# Patient Record
Sex: Male | Born: 1945 | Race: White | Hispanic: No | Marital: Married | Smoking: Former smoker
Health system: Southern US, Community
[De-identification: ages and names within clinical notes are randomized; demographics above are authoritative.]

## PROBLEM LIST (undated history)

## (undated) DIAGNOSIS — I2510 Atherosclerotic heart disease of native coronary artery without angina pectoris: Secondary | ICD-10-CM

## (undated) DIAGNOSIS — J449 Chronic obstructive pulmonary disease, unspecified: Secondary | ICD-10-CM

## (undated) DIAGNOSIS — I499 Cardiac arrhythmia, unspecified: Secondary | ICD-10-CM

## (undated) DIAGNOSIS — E119 Type 2 diabetes mellitus without complications: Secondary | ICD-10-CM

## (undated) DIAGNOSIS — C61 Malignant neoplasm of prostate: Secondary | ICD-10-CM

## (undated) DIAGNOSIS — I639 Cerebral infarction, unspecified: Secondary | ICD-10-CM

## (undated) DIAGNOSIS — J189 Pneumonia, unspecified organism: Secondary | ICD-10-CM

## (undated) DIAGNOSIS — K219 Gastro-esophageal reflux disease without esophagitis: Secondary | ICD-10-CM

## (undated) DIAGNOSIS — E785 Hyperlipidemia, unspecified: Secondary | ICD-10-CM

## (undated) DIAGNOSIS — I4891 Unspecified atrial fibrillation: Secondary | ICD-10-CM

## (undated) DIAGNOSIS — I10 Essential (primary) hypertension: Secondary | ICD-10-CM

## (undated) DIAGNOSIS — I739 Peripheral vascular disease, unspecified: Secondary | ICD-10-CM

## (undated) DIAGNOSIS — I509 Heart failure, unspecified: Secondary | ICD-10-CM

## (undated) HISTORY — DX: Cardiac arrhythmia, unspecified: I49.9

## (undated) HISTORY — PX: COLONOSCOPY: SHX174

## (undated) HISTORY — PX: PROSTATE BIOPSY: SHX241

## (undated) HISTORY — DX: Heart failure, unspecified: I50.9

## (undated) HISTORY — DX: Pneumonia, unspecified organism: J18.9

## (undated) HISTORY — PX: TONSILLECTOMY: SUR1361

## (undated) HISTORY — DX: Cerebral infarction, unspecified: I63.9

## (undated) HISTORY — DX: Gastro-esophageal reflux disease without esophagitis: K21.9

## (undated) HISTORY — PX: EYE SURGERY: SHX253

---

## 2004-11-03 HISTORY — PX: CORONARY ARTERY BYPASS GRAFT: SHX141

## 2008-05-12 ENCOUNTER — Ambulatory Visit: Payer: Self-pay | Admitting: Cardiology

## 2008-09-14 ENCOUNTER — Ambulatory Visit: Payer: Self-pay | Admitting: Vascular Surgery

## 2010-06-15 ENCOUNTER — Inpatient Hospital Stay: Payer: Self-pay | Admitting: Internal Medicine

## 2010-07-21 ENCOUNTER — Inpatient Hospital Stay: Payer: Self-pay | Admitting: *Deleted

## 2010-08-14 ENCOUNTER — Ambulatory Visit: Payer: Self-pay | Admitting: Family

## 2010-08-23 ENCOUNTER — Ambulatory Visit: Payer: Self-pay | Admitting: Family

## 2010-09-20 ENCOUNTER — Ambulatory Visit: Payer: Self-pay | Admitting: Family

## 2011-02-07 ENCOUNTER — Ambulatory Visit: Payer: Self-pay | Admitting: Family

## 2013-12-21 ENCOUNTER — Ambulatory Visit: Payer: Self-pay | Admitting: Neurology

## 2014-01-09 ENCOUNTER — Emergency Department: Payer: Self-pay | Admitting: Emergency Medicine

## 2014-01-09 ENCOUNTER — Encounter: Payer: Self-pay | Admitting: Neurology

## 2014-02-01 ENCOUNTER — Encounter: Payer: Self-pay | Admitting: Neurology

## 2014-04-29 ENCOUNTER — Inpatient Hospital Stay: Payer: Self-pay | Admitting: Internal Medicine

## 2014-04-29 LAB — URINALYSIS, COMPLETE
BILIRUBIN, UR: NEGATIVE
Bacteria: NONE SEEN
Glucose,UR: NEGATIVE mg/dL (ref 0–75)
KETONE: NEGATIVE
Nitrite: NEGATIVE
Ph: 5 (ref 4.5–8.0)
Protein: 30
RBC,UR: 4 /HPF (ref 0–5)
SPECIFIC GRAVITY: 1.009 (ref 1.003–1.030)
Squamous Epithelial: <1

## 2014-04-29 LAB — CBC
HCT: 43.7 % (ref 40.0–52.0)
HGB: 14.1 g/dL (ref 13.0–18.0)
MCH: 28.8 pg (ref 26.0–34.0)
MCHC: 32.2 g/dL (ref 32.0–36.0)
MCV: 89 fL (ref 80–100)
PLATELETS: 254 10*3/uL (ref 150–440)
RBC: 4.9 10*6/uL (ref 4.40–5.90)
RDW: 14.7 % — AB (ref 11.5–14.5)
WBC: 21.5 10*3/uL — ABNORMAL HIGH (ref 3.8–10.6)

## 2014-04-29 LAB — TROPONIN I
TROPONIN-I: 0.03 ng/mL
TROPONIN-I: 0.03 ng/mL
TROPONIN-I: 0.04 ng/mL

## 2014-04-29 LAB — COMPREHENSIVE METABOLIC PANEL
ALBUMIN: 4 g/dL (ref 3.4–5.0)
ANION GAP: 9 (ref 7–16)
Alkaline Phosphatase: 92 U/L
BUN: 16 mg/dL (ref 7–18)
Bilirubin,Total: 1 mg/dL (ref 0.2–1.0)
CHLORIDE: 98 mmol/L (ref 98–107)
Calcium, Total: 9 mg/dL (ref 8.5–10.1)
Co2: 27 mmol/L (ref 21–32)
Creatinine: 1.68 mg/dL — ABNORMAL HIGH (ref 0.60–1.30)
GFR CALC AF AMER: 48 — AB
GFR CALC NON AF AMER: 41 — AB
Glucose: 279 mg/dL — ABNORMAL HIGH (ref 65–99)
Osmolality: 279 (ref 275–301)
Potassium: 3.2 mmol/L — ABNORMAL LOW (ref 3.5–5.1)
SGOT(AST): 10 U/L — ABNORMAL LOW (ref 15–37)
SGPT (ALT): 17 U/L (ref 12–78)
SODIUM: 134 mmol/L — AB (ref 136–145)
Total Protein: 7.7 g/dL (ref 6.4–8.2)

## 2014-04-29 LAB — PROTIME-INR
INR: 0.9
Prothrombin Time: 12.5 secs (ref 11.5–14.7)

## 2014-04-30 LAB — BASIC METABOLIC PANEL
Anion Gap: 6 — ABNORMAL LOW (ref 7–16)
BUN: 14 mg/dL (ref 7–18)
CHLORIDE: 105 mmol/L (ref 98–107)
CREATININE: 1.36 mg/dL — AB (ref 0.60–1.30)
Calcium, Total: 8.7 mg/dL (ref 8.5–10.1)
Co2: 25 mmol/L (ref 21–32)
EGFR (African American): >60
GFR CALC NON AF AMER: 53 — AB
Glucose: 84 mg/dL (ref 65–99)
Osmolality: 272 (ref 275–301)
Potassium: 3.8 mmol/L (ref 3.5–5.1)
Sodium: 136 mmol/L (ref 136–145)

## 2014-04-30 LAB — CBC WITH DIFFERENTIAL/PLATELET
BASOS ABS: 0.1 10*3/uL (ref 0.0–0.1)
BASOS PCT: 0.5 %
EOS ABS: 0 10*3/uL (ref 0.0–0.7)
Eosinophil %: 0.2 %
HCT: 37.7 % — AB (ref 40.0–52.0)
HGB: 12.7 g/dL — ABNORMAL LOW (ref 13.0–18.0)
LYMPHS PCT: 7.1 %
Lymphocyte #: 1.6 10*3/uL (ref 1.0–3.6)
MCH: 30.2 pg (ref 26.0–34.0)
MCHC: 33.6 g/dL (ref 32.0–36.0)
MCV: 90 fL (ref 80–100)
MONOS PCT: 5.8 %
Monocyte #: 1.3 x10 3/mm — ABNORMAL HIGH (ref 0.2–1.0)
NEUTROS ABS: 19 10*3/uL — AB (ref 1.4–6.5)
Neutrophil %: 86.4 %
Platelet: 210 10*3/uL (ref 150–440)
RBC: 4.19 10*6/uL — ABNORMAL LOW (ref 4.40–5.90)
RDW: 14.8 % — ABNORMAL HIGH (ref 11.5–14.5)
WBC: 22 10*3/uL — ABNORMAL HIGH (ref 3.8–10.6)

## 2014-04-30 LAB — MAGNESIUM: Magnesium: 2 mg/dL

## 2014-05-01 LAB — URINE CULTURE

## 2014-05-03 LAB — CULTURE, BLOOD (SINGLE)

## 2014-05-11 DIAGNOSIS — R262 Difficulty in walking, not elsewhere classified: Secondary | ICD-10-CM | POA: Insufficient documentation

## 2014-10-17 ENCOUNTER — Ambulatory Visit: Payer: Self-pay | Admitting: Gastroenterology

## 2015-02-24 NOTE — Discharge Summary (Signed)
PATIENT NAME:  [name], [name] MR#:  [id number] DATE OF BIRTH:  [birth year]  DATE OF ADMISSION:  04/29/2014 DATE OF DISCHARGE:  05/01/2014  PRIMARY CARE PHYSICIAN: Dr. Baldemar Lenis  DISCHARGE DIAGNOSES: 1.  Pseudomonas urinary tract infection. 2.  Acute renal failure.  3.  Shortness of breath with pulmonary edema. 4.  Coronary artery disease.  5.  Chronic obstructive pulmonary disease.  6.  Diabetes mellitus type 2.   DISCHARGE MEDICATIONS: 1.  ProAir 2 puffs as needed for wheezing.  2.  Lisinopril 5 mg p.o. daily.  3.  Metformin 1 gram p.o. b.i.d.  4.  Omeprazole 20 mg p.o. daily.  5.  Advair Diskus 250/50 one puff b.i.d.  6.  Spiriva 18 mcg inhalation daily.  7.  Imdur 30 mg p.o. daily.  8.  KCl 10 mEq p.o. daily with Lasix.  9.  Lopressor 50 mg p.o. b.i.d.  10.  Lantus 50 units at bedtime.  11.  Aspirin 81 mg daily. 12.  Furosemide 40 mg p.o. b.i.d.  13.  Ocuvite multivitamins 1 tablet daily.  14.  Plavix 75 mg p.o. daily.  15.  Simvastatin 20 mg p.o. daily.  16.  Levaquin 750 mg every other day for 5 tablets.   HOME HEALTH: None:   DISCHARGE DIET: Low-sodium, ADA diet.  CONSULTATIONS: None.   HOSPITAL COURSE:  1.  Urinary tract infection. A 69 year old male patient with multiple medical problems brought in because of trouble breathing. The patient had a temperature of 101 at home. He had 1 or 2 in the Emergency Room and the patient admitted to the hospitalist service for sepsis with leukocytosis, possible source is UTI. He received Levaquin and he was admitted to the hospitalist service. Blood cultures were negative. Urine culture showed Pseudomonas in the urine. The patient's urine culture showed more than 100,000 colonies of Pseudomonas sensitive to Cipro, and Levaquin. The patient's WBC was 21.1 on admission. The patient has been afebrile for 2 days and then he was discharged home. Discharged home with Levaquin to continue.  2.  Shortness of breath. The patient did not have any  troponin elevation and we did a VQ scan which did not show any pulmonary emboli. The patient felt weak and short of breath due to sepsis. He did not require oxygen.  3.  Acute renal failure, due to sepsis. The patient had sepsis present on admission. His BUN was 16 and creatinine 1.68 on admission and he received IV fluids. Creatinine was 1.36 on June 20th and BUN 14. We stopped the metformin, Lasix and lisinopril that he was taking when he came. Acute renal failure improved with fluids and holding the nephrotoxic medications. At the time of discharge, renal failure improved, sepsis resolved and discharged home with antibiotics.  4.  The patient had trouble breathing with uncontrolled hypertension and hypoxia, on June 20th, in the afternoon, and we thought it could be due to fluid overload. The patient received Lasix. Blood pressure improved. The patient's blood pressure was 229/109 at one point, on June 28th, and the patient did have some crackles and we stopped the fluids. The patient received Lasix and after that the patient's blood pressure improved and we were able to wean off the oxygen.  5.  History of coronary artery disease. He is on aspirin, Plavix, statins, Imdur and lisinopril. He is also on Lopressor 50 mg p.o. b.i.d. We continued that.  6.  Diabetes mellitus type 2. He can resume his Lantus at 50 units at bedtime.  Condition at the time of discharge is stable. The patient was given Levaquin for 10 days.   ___________________________ Epifanio Lesches, MD sk:sb D: 05/04/2014 09:50:24 ET T: 05/04/2014 13:44:04 ET JOB#: [id number]  cc: Epifanio Lesches, MD, <Dictator> [name], MD Epifanio Lesches MD ELECTRONICALLY SIGNED 05/18/2014 18:44

## 2015-02-24 NOTE — H&P (Signed)
PATIENT NAMEMARKUS, [name] MR#:  [id number] DATE OF BIRTH:  1945/11/27  DATE OF ADMISSION:  04/29/2014  PRIMARY CARE PHYSICIAN: Dr. Baldemar Lenis.  CHIEF COMPLAINT: Shortness of breath.   HISTORY OF PRESENT ILLNESS: This is a 69 year old male with known history of COPD, coronary artery disease, hypertension, and diabetes who presents with complaints of chest pain. Reports chest pain started yesterday evening. Reports at baseline, he has chronic shortness of breath secondary to his COPD, where he used to be on oxygen in the past, but it was discontinued as his oxygen saturation was good. Reports it had significantly worsened yesterday after presentation, which prompted him to ED.  He denies any chest pain, any discomfort. Reports chronic cough with no recent worsening. He denies any orthopnea as well. Reports mild bilateral lower extremity edema, which is chronic as well. Reports it is worse upon exertion. The patient's chest x-ray did not show any acute findings. Did not have significant hypoxia. The patient was found to be febrile in the ED with significant leukocytosis, and he had urinary tract infection. The patient was started on levofloxacin for that. The patient's blood work did show acute renal failure. The patient's baseline creatinine is within normal limits, today was found to be at 1.68, and had mild hypokalemia at 3.2 as well. The hospitalist service was requested to admit the patient for further work-up and evaluation of his shortness of breath and treatment of his UTI. The patient did not have any wheezing upon presentation as well.   PAST MEDICAL HISTORY:  1.  COPD.  2.  Coronary artery disease.  3.  Hypertension.  4.  Diabetes.  5.  Peripheral vascular disease.  6.  Erectile dysfunction.  7.  Tonsillectomy.   SOCIAL HISTORY: The patient lives at home with his wife. The patient still smokes 1 pack per day. Drinks alcohol occasionally.  No illicit drug use.   FAMILY HISTORY: Significant  for lung cancer.   ALLERGIES: NO KNOWN DRUG ALLERGIES.   HOME MEDICATIONS:  1.  Aspirin 81 mg daily.  2.  Plavix 75 mg daily.  3.  Imdur 30 mg daily.  4.  Lisinopril 5 mg daily.  5.  Lantus 50 units subcutaneous at bedtime.  6.  Metformin 1 gram oral 2 times a day.  7.  Simvastatin 20 mg oral daily.  8.  Lopressor 50 mg b.i.d.  9.  As needed albuterol inhalational daily.  10.  Advair 250/50 one puff b.i.d.  11.  Lasix 40 mg oral b.i.d.  12.  Potassium 10 mEq oral daily. 13.  Omeprazole 20 mg oral daily.  14.  Multivitamin 1 tablet daily.  15.  DuoNeb as needed.  REVIEW OF SYSTEMS:  CONSTITUTIONAL: Reports chills, was febrile. Reports weakness. Denies weight gain, weight loss.  EYES: Denies blurry vision, double vision, inflammation.  EAR, NOSE AND THROAT: Denies tinnitus, ear pain, hearing loss, epistaxis.  RESPIRATORY: Denies hematemesis.  Reports cough and shortness of breath. Denies any wheezing.  CARDIOVASCULAR: Denies chest pain, palpitations, and syncope.  GASTROINTESTINAL: Denies nausea, vomiting, diarrhea, abdominal pain, hematemesis.  GENITOURINARY: Denies dysuria, hematuria, renal colic.  ENDOCRINE: Denies polyuria, polydipsia, heat or cold intolerance.  HEMATOLOGY: Denies anemia, easy bruising, bleeding diathesis.  INTEGUMENT: Denies acne, rash, or skin lesion.  MUSCULOSKELETAL: Denies any gout, arthritis or cramps.  NEUROLOGIC: Denies CVA, TIA, tremors, vertigo, ataxia.  PSYCHIATRIC: Denies anxiety, insomnia, or depression.   PHYSICAL EXAMINATION:  VITAL SIGNS: Temperature 100.1, T-max 102.2, pulse 97, respiratory rate 26, blood  pressure 175/69, saturating 97% on oxygen.  GENERAL: Well-nourished male, looks comfortable in bed, in no apparent distress.  HEENT: Head atraumatic, normocephalic. Pupils equal and reactive to light. Pink conjunctivae. Anicteric sclerae. Dry oral mucosa.  Four to 5 cm JVD.  CHEST: Good air entry bilaterally. No wheezing, rales, or rhonchi.   CARDIOVASCULAR: S1, S2 heard. No rubs, murmurs or gallops.  ABDOMEN: Soft, nontender, nondistended. Bowel sounds present.  EXTREMITIES: No edema. No clubbing. No cyanosis. Pedal pulses were felt bilaterally.  PSYCHIATRIC: Appropriate affect. Awake, alert x 3. Intact judgment and insight.  NEUROLOGIC: Cranial nerves grossly intact. Motor 4/5. No focal deficits.  MUSCULOSKELETAL: No joint effusion or erythema.  SKIN: Red, skin turgor dry.   PERTINENT LABORATORY DATA: Glucose 279, BUN 16, creatinine 1.68, sodium 134, potassium 3.2, chloride 98, CO2 27, ALT 17, AST 10, alkaline phosphatase 92. White blood cell 21,000, hemoglobin 14.1, hematocrit 48.7, platelets 254,000.   URINALYSIS: +1 leukocyte esterase and 49 white blood cells.   IMAGING STUDIES: CHEST X-RAY: No acute cardiopulmonary abnormality seen.   ASSESSMENT AND PLAN:  1.  Sepsis. The patient is febrile with significant leukocytosis. So most likely source is urinary tract infection. We will follow on urine cultures. Blood cultures. We will continue with IV levofloxacin.   2.  Shortness of breath. The patient has baseline shortness of breath secondary to his congestive heart failure and chronic obstructive pulmonary disease, but none appears to be in exacerbation. Given the fact he is having mild tachycardia and shortness of breath, will need to rule him out for PE. Given his acute renal failure, cannot do a CT with IV contrast, so the patient will have V/Q scan done in the morning when it is available. Meanwhile, he will be covered with subcutaneous Lovenox 1 mg/kg x 1 dose until the V/Q scan is done. The plan was discussed with the patient and his wife and they are agreeable to the plan, and they understand the risks and benefits of anticoagulation for the time being.   3.  Chronic obstructive pulmonary disease. The patient has no active wheezing. We will continue with DuoNeb and p.r.n. albuterol.  Meanwhile, we will continue him on  Spiriva and Advair.  4.  Acute renal failure. We will hold his Lasix. We will hold his ACE inhibitor. The patient appears to be clinically dehydrated. We will hydrate. We will monitor closely.   5.  Hypokalemia. We will replace. We will check level in a.m.   6.  History of coronary artery disease. Denies any chest pain. We will check troponins. We will continue him on aspirin, Plavix and beta blockers. We will resume back on ACE inhibitor once stable.   7.  Hypertension. Blood pressure mildly elevated. Will resume back on home medications.   8.  Diabetes mellitus. We will continue on Lantus. Will add insulin sliding scale.   9.  Deep vein thrombosis prophylaxis, sequential compression devices, and received one dose of Lovenox treatment dose. We will await V/Q scan results to decide on to continue on treatment versus prophylaxis  dose.   10.  CODE STATUS: FULL CODE.   TOTAL TIME SPENT ON ADMISSION AND PATIENT CARE: 55 minutes.    ____________________________ Albertine Patricia, MD dse:ts D: 04/29/2014 01:56:30 ET T: 04/29/2014 09:46:42 ET JOB#: [id number]  cc: Albertine Patricia, MD, <Dictator> DAWOOD Graciela Husbands MD ELECTRONICALLY SIGNED 04/30/2014 23:28

## 2015-02-26 LAB — SURGICAL PATHOLOGY

## 2015-12-03 ENCOUNTER — Emergency Department: Payer: PPO

## 2015-12-03 ENCOUNTER — Encounter: Payer: Self-pay | Admitting: Emergency Medicine

## 2015-12-03 ENCOUNTER — Inpatient Hospital Stay
Admission: EM | Admit: 2015-12-03 | Discharge: 2015-12-07 | DRG: 304 | Disposition: A | Discharge status: Home / Self Care | Payer: PPO | Attending: Internal Medicine | Admitting: Internal Medicine

## 2015-12-03 DIAGNOSIS — F17210 Nicotine dependence, cigarettes, uncomplicated: Secondary | ICD-10-CM | POA: Diagnosis present

## 2015-12-03 DIAGNOSIS — Z951 Presence of aortocoronary bypass graft: Secondary | ICD-10-CM

## 2015-12-03 DIAGNOSIS — Z79899 Other long term (current) drug therapy: Secondary | ICD-10-CM | POA: Diagnosis not present

## 2015-12-03 DIAGNOSIS — Z7902 Long term (current) use of antithrombotics/antiplatelets: Secondary | ICD-10-CM | POA: Diagnosis not present

## 2015-12-03 DIAGNOSIS — J189 Pneumonia, unspecified organism: Secondary | ICD-10-CM | POA: Diagnosis present

## 2015-12-03 DIAGNOSIS — I5021 Acute systolic (congestive) heart failure: Secondary | ICD-10-CM | POA: Diagnosis present

## 2015-12-03 DIAGNOSIS — E119 Type 2 diabetes mellitus without complications: Secondary | ICD-10-CM | POA: Diagnosis present

## 2015-12-03 DIAGNOSIS — I160 Hypertensive urgency: Secondary | ICD-10-CM | POA: Diagnosis not present

## 2015-12-03 DIAGNOSIS — J441 Chronic obstructive pulmonary disease with (acute) exacerbation: Secondary | ICD-10-CM | POA: Diagnosis not present

## 2015-12-03 DIAGNOSIS — J810 Acute pulmonary edema: Secondary | ICD-10-CM | POA: Diagnosis not present

## 2015-12-03 DIAGNOSIS — Z7982 Long term (current) use of aspirin: Secondary | ICD-10-CM

## 2015-12-03 DIAGNOSIS — K219 Gastro-esophageal reflux disease without esophagitis: Secondary | ICD-10-CM | POA: Diagnosis present

## 2015-12-03 DIAGNOSIS — Z794 Long term (current) use of insulin: Secondary | ICD-10-CM | POA: Diagnosis not present

## 2015-12-03 DIAGNOSIS — J9601 Acute respiratory failure with hypoxia: Secondary | ICD-10-CM | POA: Diagnosis present

## 2015-12-03 DIAGNOSIS — J440 Chronic obstructive pulmonary disease with acute lower respiratory infection: Secondary | ICD-10-CM | POA: Diagnosis not present

## 2015-12-03 DIAGNOSIS — R0602 Shortness of breath: Secondary | ICD-10-CM

## 2015-12-03 DIAGNOSIS — I509 Heart failure, unspecified: Secondary | ICD-10-CM

## 2015-12-03 DIAGNOSIS — I110 Hypertensive heart disease with heart failure: Secondary | ICD-10-CM | POA: Diagnosis present

## 2015-12-03 DIAGNOSIS — R7989 Other specified abnormal findings of blood chemistry: Secondary | ICD-10-CM | POA: Diagnosis not present

## 2015-12-03 DIAGNOSIS — Z7951 Long term (current) use of inhaled steroids: Secondary | ICD-10-CM

## 2015-12-03 DIAGNOSIS — I248 Other forms of acute ischemic heart disease: Secondary | ICD-10-CM | POA: Diagnosis not present

## 2015-12-03 DIAGNOSIS — J9621 Acute and chronic respiratory failure with hypoxia: Secondary | ICD-10-CM | POA: Diagnosis present

## 2015-12-03 DIAGNOSIS — I2510 Atherosclerotic heart disease of native coronary artery without angina pectoris: Secondary | ICD-10-CM | POA: Diagnosis present

## 2015-12-03 HISTORY — DX: Essential (primary) hypertension: I10

## 2015-12-03 HISTORY — DX: Chronic obstructive pulmonary disease, unspecified: J44.9

## 2015-12-03 HISTORY — DX: Type 2 diabetes mellitus without complications: E11.9

## 2015-12-03 HISTORY — DX: Atherosclerotic heart disease of native coronary artery without angina pectoris: I25.10

## 2015-12-03 LAB — CBC WITH DIFFERENTIAL/PLATELET
BASOS ABS: 0.2 10*3/uL — AB (ref 0–0.1)
Basophils Relative: 1 %
EOS ABS: 0.1 10*3/uL (ref 0–0.7)
EOS PCT: 0 %
HCT: 56.6 % — ABNORMAL HIGH (ref 40.0–52.0)
Hemoglobin: 17.9 g/dL (ref 13.0–18.0)
Lymphocytes Relative: 20 %
Lymphs Abs: 5.4 10*3/uL — ABNORMAL HIGH (ref 1.0–3.6)
MCH: 28.6 pg (ref 26.0–34.0)
MCHC: 31.7 g/dL — ABNORMAL LOW (ref 32.0–36.0)
MCV: 90.3 fL (ref 80.0–100.0)
MONO ABS: 1.8 10*3/uL — AB (ref 0.2–1.0)
Monocytes Relative: 7 %
Neutro Abs: 18.9 10*3/uL — ABNORMAL HIGH (ref 1.4–6.5)
Neutrophils Relative %: 72 %
PLATELETS: 314 10*3/uL (ref 150–440)
RBC: 6.27 MIL/uL — AB (ref 4.40–5.90)
RDW: 15.8 % — AB (ref 11.5–14.5)
WBC: 26.4 10*3/uL — AB (ref 3.8–10.6)

## 2015-12-03 LAB — BASIC METABOLIC PANEL
ANION GAP: 12 (ref 5–15)
BUN: 12 mg/dL (ref 6–20)
CALCIUM: 9.4 mg/dL (ref 8.9–10.3)
CO2: 23 mmol/L (ref 22–32)
Chloride: 102 mmol/L (ref 101–111)
Creatinine, Ser: 1.43 mg/dL — ABNORMAL HIGH (ref 0.61–1.24)
GFR calc Af Amer: 56 mL/min — ABNORMAL LOW (ref >60)
GFR, EST NON AFRICAN AMERICAN: 48 mL/min — AB (ref >60)
GLUCOSE: 271 mg/dL — AB (ref 65–99)
POTASSIUM: 3.4 mmol/L — AB (ref 3.5–5.1)
SODIUM: 137 mmol/L (ref 135–145)

## 2015-12-03 LAB — GLUCOSE, CAPILLARY: GLUCOSE-CAPILLARY: 343 mg/dL — AB (ref 65–99)

## 2015-12-03 LAB — TROPONIN I: Troponin I: 0.06 ng/mL — ABNORMAL HIGH (ref <0.031)

## 2015-12-03 LAB — BRAIN NATRIURETIC PEPTIDE: B NATRIURETIC PEPTIDE 5: 3118 pg/mL — AB (ref 0.0–100.0)

## 2015-12-03 MED ORDER — FUROSEMIDE 10 MG/ML IJ SOLN
40.0000 mg | Freq: Once | INTRAMUSCULAR | Status: AC
  Administered 2015-12-03: 40 mg via INTRAVENOUS

## 2015-12-03 MED ORDER — ACETAMINOPHEN 650 MG RE SUPP: 650.0000 mg | Freq: Four times a day (QID) | RECTAL | Status: DC | PRN

## 2015-12-03 MED ORDER — INSULIN ASPART 100 UNIT/ML ~~LOC~~ SOLN
0.0000 [IU] | Freq: Four times a day (QID) | SUBCUTANEOUS | Status: DC
  Administered 2015-12-04: 7 [IU] via SUBCUTANEOUS
  Administered 2015-12-04: 5 [IU] via SUBCUTANEOUS
  Administered 2015-12-04: 2 [IU] via SUBCUTANEOUS
  Administered 2015-12-04: 7 [IU] via SUBCUTANEOUS
  Filled 2015-12-03: qty 3
  Filled 2015-12-03: qty 5
  Filled 2015-12-03 (×2): qty 7

## 2015-12-03 MED ORDER — ASPIRIN EC 81 MG PO TBEC
81.0000 mg | DELAYED_RELEASE_TABLET | Freq: Every day | ORAL | Status: DC
  Administered 2015-12-04 – 2015-12-07 (×4): 81 mg via ORAL
  Filled 2015-12-03 (×4): qty 1

## 2015-12-03 MED ORDER — LISINOPRIL 20 MG PO TABS
40.0000 mg | ORAL_TABLET | Freq: Every day | ORAL | Status: DC
Start: 2015-12-04 — End: 2015-12-07
  Administered 2015-12-04 – 2015-12-07 (×4): 40 mg via ORAL
  Filled 2015-12-03 (×4): qty 2

## 2015-12-03 MED ORDER — OCUVITE-LUTEIN PO CAPS: 1.0000 | ORAL_CAPSULE | Freq: Every day | ORAL | Status: DC

## 2015-12-03 MED ORDER — ISOSORBIDE MONONITRATE ER 30 MG PO TB24
30.0000 mg | ORAL_TABLET | Freq: Every day | ORAL | Status: DC
  Administered 2015-12-04 – 2015-12-05 (×2): 30 mg via ORAL
  Filled 2015-12-03 (×2): qty 1

## 2015-12-03 MED ORDER — NITROGLYCERIN IN D5W 200-5 MCG/ML-% IV SOLN
0.0000 ug/min | Freq: Once | INTRAVENOUS | Status: AC
Start: 2015-12-03 — End: 2015-12-04
  Administered 2015-12-03: 100 ug/min via INTRAVENOUS

## 2015-12-03 MED ORDER — IPRATROPIUM-ALBUTEROL 0.5-2.5 (3) MG/3ML IN SOLN
3.0000 mL | RESPIRATORY_TRACT | Status: DC | PRN
  Administered 2015-12-05: 3 mL via RESPIRATORY_TRACT
  Filled 2015-12-03: qty 3

## 2015-12-03 MED ORDER — FUROSEMIDE 10 MG/ML IJ SOLN
40.0000 mg | Freq: Two times a day (BID) | INTRAMUSCULAR | Status: DC
  Administered 2015-12-04 – 2015-12-05 (×3): 40 mg via INTRAVENOUS
  Filled 2015-12-03 (×3): qty 4

## 2015-12-03 MED ORDER — ONDANSETRON HCL 4 MG PO TABS: 4.0000 mg | ORAL_TABLET | Freq: Four times a day (QID) | ORAL | Status: DC | PRN

## 2015-12-03 MED ORDER — ADULT MULTIVITAMIN W/MINERALS CH
1.0000 | ORAL_TABLET | Freq: Every day | ORAL | Status: DC
  Administered 2015-12-04 – 2015-12-07 (×4): 1 via ORAL
  Filled 2015-12-03 (×4): qty 1

## 2015-12-03 MED ORDER — ONDANSETRON HCL 4 MG/2ML IJ SOLN: 4.0000 mg | Freq: Four times a day (QID) | INTRAMUSCULAR | Status: DC | PRN

## 2015-12-03 MED ORDER — ACETAMINOPHEN 325 MG PO TABS: 650.0000 mg | ORAL_TABLET | Freq: Four times a day (QID) | ORAL | Status: DC | PRN

## 2015-12-03 MED ORDER — GABAPENTIN 100 MG PO CAPS
100.0000 mg | ORAL_CAPSULE | Freq: Two times a day (BID) | ORAL | Status: DC
  Administered 2015-12-04 – 2015-12-07 (×7): 100 mg via ORAL
  Filled 2015-12-03 (×7): qty 1

## 2015-12-03 MED ORDER — CLOPIDOGREL BISULFATE 75 MG PO TABS
75.0000 mg | ORAL_TABLET | Freq: Every day | ORAL | Status: DC
Start: 2015-12-04 — End: 2015-12-07
  Administered 2015-12-04 – 2015-12-07 (×4): 75 mg via ORAL
  Filled 2015-12-03 (×4): qty 1

## 2015-12-03 MED ORDER — METHYLPREDNISOLONE SODIUM SUCC 125 MG IJ SOLR
125.0000 mg | Freq: Once | INTRAMUSCULAR | Status: AC
  Administered 2015-12-03: 125 mg via INTRAVENOUS
  Filled 2015-12-03: qty 2

## 2015-12-03 MED ORDER — OXYCODONE HCL 5 MG PO TABS: 5.0000 mg | ORAL_TABLET | ORAL | Status: DC | PRN

## 2015-12-03 MED ORDER — SODIUM CHLORIDE 0.9% FLUSH
3.0000 mL | Freq: Two times a day (BID) | INTRAVENOUS | Status: DC
  Administered 2015-12-03 – 2015-12-04 (×2): 3 mL via INTRAVENOUS

## 2015-12-03 MED ORDER — PANTOPRAZOLE SODIUM 40 MG PO TBEC
40.0000 mg | DELAYED_RELEASE_TABLET | Freq: Every day | ORAL | Status: DC
  Administered 2015-12-04 – 2015-12-07 (×4): 40 mg via ORAL
  Filled 2015-12-03 (×4): qty 1

## 2015-12-03 MED ORDER — MORPHINE SULFATE (PF) 2 MG/ML IV SOLN: 2.0000 mg | INTRAVENOUS | Status: DC | PRN

## 2015-12-03 MED ORDER — NICOTINE 21 MG/24HR TD PT24
21.0000 mg | MEDICATED_PATCH | Freq: Every day | TRANSDERMAL | Status: DC
  Administered 2015-12-04 – 2015-12-07 (×4): 21 mg via TRANSDERMAL
  Filled 2015-12-03 (×4): qty 1

## 2015-12-03 MED ORDER — SIMVASTATIN 20 MG PO TABS
20.0000 mg | ORAL_TABLET | Freq: Every day | ORAL | Status: DC
  Administered 2015-12-04 – 2015-12-06 (×4): 20 mg via ORAL
  Filled 2015-12-03 (×4): qty 1

## 2015-12-03 MED ORDER — HEPARIN SODIUM (PORCINE) 5000 UNIT/ML IJ SOLN
5000.0000 [IU] | Freq: Three times a day (TID) | INTRAMUSCULAR | Status: DC
  Administered 2015-12-03 – 2015-12-06 (×7): 5000 [IU] via SUBCUTANEOUS
  Filled 2015-12-03 (×7): qty 1

## 2015-12-03 MED ORDER — FUROSEMIDE 10 MG/ML IJ SOLN
INTRAMUSCULAR | Status: AC
  Administered 2015-12-03: 40 mg via INTRAVENOUS
  Filled 2015-12-03: qty 4

## 2015-12-03 MED ORDER — METOPROLOL TARTRATE 50 MG PO TABS
50.0000 mg | ORAL_TABLET | Freq: Two times a day (BID) | ORAL | Status: DC
Start: 2015-12-03 — End: 2015-12-07
  Administered 2015-12-04 – 2015-12-07 (×8): 50 mg via ORAL
  Filled 2015-12-03 (×8): qty 1

## 2015-12-03 NOTE — ED Provider Notes (Signed)
[facility] Emergency Department Provider Note  ____________________________________________  Time seen: Seen upon arrival to the emergency department  I have reviewed the triage vital signs and the nursing notes.   HISTORY  Chief Complaint Respiratory Distress    HPI [name] is a 70 y.o. male with a history of COPD, CHF and CABG who is presenting today with acute onset shortness of breath just prior to arrival. He says that he was having some shortness of breath earlier in the morning today and called EMS. However, once the ambulance crew arrived he became acutely short of breath and diaphoretic with oxygen saturationin the 80s on room air. He is denying any chest pain. He was placed on CPAP en route. He was also given 3 duo nebs through the CPAP. He only had minimal improvement in his breathing with the CPAP and duo nebs. He denies any fever at home but says he did have a cough. Says he has not taken his Lasix since last night. Also found to have elevated blood pressure in route.   No past medical history on file.  There are no active problems to display for this patient.   No past surgical history on file.  Current Outpatient Rx  Name  Route  Sig  Dispense  Refill  . albuterol (PROVENTIL HFA;VENTOLIN HFA) 108 (90 Base) MCG/ACT inhaler   Inhalation   Inhale 2 puffs into the lungs every 6 (six) hours as needed for wheezing or shortness of breath.         Marland Kitchen aspirin EC 81 MG tablet   Oral   Take 81 mg by mouth daily.           Allergies Review of patient's allergies indicates not on file.  No family history on file.  Social History Social History  Substance Use Topics  . Smoking status: Not on file  . Smokeless tobacco: Not on file  . Alcohol Use: Not on file    Review of Systems Constitutional: No fever/chills Eyes: No visual changes. ENT: No sore throat. Cardiovascular: Denies chest pain. Respiratory: As above Gastrointestinal:  No abdominal pain.  No nausea, no vomiting.  No diarrhea.  No constipation. Genitourinary: Negative for dysuria. Musculoskeletal: Negative for back pain. Skin: Negative for rash. Neurological: Negative for headaches, focal weakness or numbness.  10-point ROS otherwise negative.  ____________________________________________   PHYSICAL EXAM:  VITAL SIGNS: ED Triage Vitals  Enc Vitals Group     BP 12/03/15 1654 237/151 mmHg     Pulse Rate 12/03/15 1655 94     Resp 12/03/15 1650 52     Temp --      Temp src --      SpO2 12/03/15 1650 99 %     Weight --      Height --      Head Cir --      Peak Flow --      Pain Score --      Pain Loc --      Pain Edu? --      Excl. in [place]? --     Constitutional: Alert and oriented. In obvious respiratory distress on CPAP. Eyes: Conjunctivae are normal. PERRL. EOMI. Head: Atraumatic. Nose: No congestion/rhinnorhea. Mouth/Throat: Wearing CPAP.  Neck: No stridor.   Cardiovascular: Tachycardic, regular rhythm. Grossly normal heart sounds.  Good peripheral circulation. Respiratory: Severe respiratory distress with rales throughout. Heating respirations with accessory muscle use. Does have good air movement. Gastrointestinal: Soft and nontender. No  distention.  Musculoskeletal: No lower extremity tenderness nor edema.  No joint effusions. Neurologic:  Normal speech and language. No gross focal neurologic deficits are appreciated. No gait instability. Skin:  Skin is warm, dry and intact. No rash noted. Psychiatric: Mood and affect are normal. Speech and behavior are normal.  ____________________________________________   LABS (all labs ordered are listed, but only abnormal results are displayed)  Labs Reviewed  CBC WITH DIFFERENTIAL/PLATELET - Abnormal; Notable for the following:    WBC 26.4 (*)    RBC 6.27 (*)    HCT 56.6 (*)    MCHC 31.7 (*)    RDW 15.8 (*)    Neutro Abs 18.9 (*)    Lymphs Abs 5.4 (*)    Monocytes Absolute 1.8 (*)     Basophils Absolute 0.2 (*)    All other components within normal limits  BASIC METABOLIC PANEL  BRAIN NATRIURETIC PEPTIDE  TROPONIN I   ____________________________________________  EKG  ED ECG REPORT I, Doran Stabler, the attending physician, personally viewed and interpreted this ECG.   Date: 12/03/2015  EKG Time: 1652  Rate: 148  Rhythm: sinus tachycardia  Axis: Normal axis  Intervals:Borderline wide QRS which is unchanged from previous EKGs on record.  ST&T Change: T wave inversions in 2, 3, aVF, V4 through V6 with minimal ST depressions concurrently which is likely related to demand. ST segment elevations consistent with widened QRS and borderline left bundle-branch block morphology. EKG machine read as acute MI however likely secondary to baseline and wide QRS morphology. We'll repeat.  ED ECG REPORT I, Doran Stabler, the attending physician, personally viewed and interpreted this ECG.   Date: 12/03/2015  EKG Time: 1655  Rate: 151  Rhythm: sinus tachycardia  Axis: Normal axis  Intervals:Wide QRS tachycardia which is unchanged from previously in the QRS interval.  ST&T Change: No ST or T wave change since previous EKG.   ____________________________________________  RADIOLOGY  Post CABG with slight pulmonary vascular congestion and bilateral in her stiff infiltrates with questioning mild pulmonary edema versus CHF. Infection not completely excluded. ____________________________________________   PROCEDURES  CRITICAL CARE Performed by: Doran Stabler   Total critical care time: 35 minutes  Critical care time was exclusive of separately billable procedures and treating other patients.  Critical care was necessary to treat or prevent imminent or life-threatening deterioration.  Critical care was time spent personally by me on the following activities: development of treatment plan with patient and/or surrogate as well as nursing, discussions  with consultants, evaluation of patient's response to treatment, examination of patient, obtaining history from patient or surrogate, ordering and performing treatments and interventions, ordering and review of laboratory studies, ordering and review of radiographic studies, pulse oximetry and re-evaluation of patient's condition.   ____________________________________________   INITIAL IMPRESSION / ASSESSMENT AND PLAN / ED COURSE  Pertinent labs & imaging results that were available during my care of the patient were reviewed by me and considered in my medical decision making (see chart for details).  ----------------------------------------- 5:36 PM on 12/03/2015 -----------------------------------------  Patient being weaned off of the nitroglycerin drip. He has had a greatly improved/decreased respiratory effort on the nitro drip. He is speaking in full sentences. He is looking well with better coloration to his skin. His oxygen saturation is 100% on the BiPAP at this time. He was also given 40 mg of Lasix. BNP found to be elevated at greater than 3000. We'll admit to the intensive care unit. Likely  hypertensive urgency with pulmonary edema. ____________________________________________   FINAL CLINICAL IMPRESSION(S) / ED DIAGNOSES  Final diagnoses:  Shortness of breath   acute CHF exacerbation.    Orbie Pyo, MD 12/03/15 1739

## 2015-12-03 NOTE — ED Notes (Signed)
Pt arrived via EMS from home for complaints of shortness of breath. EMS reports pt ambulatory upon arrival. EMS reports pt condition declined upon arrival. EMS 200/100, 80% RA. EMS reports pt became diaphoretic. EMS administered duoneb x3 and applied 1" NTG. EMS placed pt on CPAP. CBG 210.

## 2015-12-03 NOTE — Progress Notes (Signed)
Patients respiratory status has improved, patient states his breathing is back to normal. Will notify RN and doctor of changes. Patient comfortable on 6 liter nasal cannula.

## 2015-12-03 NOTE — H&P (Addendum)
[facility] [place] NAME: [name]    MR#:  [id number]  DATE OF BIRTH:  1946/02/19   DATE OF ADMISSION:  12/03/2015  PRIMARY CARE PHYSICIAN: No primary care provider on file.   REQUESTING/REFERRING PHYSICIAN: [place]  CHIEF COMPLAINT:   Chief Complaint  Patient presents with  . Respiratory Distress    HISTORY OF PRESENT ILLNESS:  [name]  is a 70 y.o. male with a known history of COPD and non-oxygen dependent, coronary artery disease status post bypass who is presenting with respiratory distress. He describes acute onset shortness of breath, EMS upon their arrival he is to speak in full sentences required CPAP, hypoxic on arrival placed on BiPAP therapy. He is not be markedly hypertensive with flash pulmonary edema. Received Lasix, placed on nitroglycerin drip- which has excessive been weaned off. He is now in improved condition, complains of chronic cough unchanged, denies any chest pain fevers chills further symptomatology.  PAST MEDICAL HISTORY:   Past Medical History  Diagnosis Date  . COPD (chronic obstructive pulmonary disease) ([place])   . Coronary artery disease   . Diabetes mellitus without complication ([place])   . Hypertension     PAST SURGICAL HISTORY:  History reviewed. No pertinent past surgical history.  SOCIAL HISTORY:   Social History  Substance Use Topics  . Smoking status: Current Every Day Smoker -- 1.00 packs/day    Types: Cigarettes  . Smokeless tobacco: Not on file  . Alcohol Use: Not on file    FAMILY HISTORY:  History reviewed. No pertinent family history.  DRUG ALLERGIES:  No Known Allergies  REVIEW OF SYSTEMS:  REVIEW OF SYSTEMS:  CONSTITUTIONAL: Denies fevers, chills, fatigue, weakness.  EYES: Denies blurred vision, double vision, or eye pain.  EARS, NOSE, THROAT: Denies tinnitus, ear pain, hearing loss.  RESPIRATORY: Positive cough, shortness of breath, denies wheezing   CARDIOVASCULAR: Denies chest pain, palpitations, edema.  GASTROINTESTINAL: Denies nausea, vomiting, diarrhea, abdominal pain.  GENITOURINARY: Denies dysuria, hematuria.  ENDOCRINE: Denies nocturia or thyroid problems. HEMATOLOGIC AND LYMPHATIC: Denies easy bruising or bleeding.  SKIN: Denies rash or lesions.  MUSCULOSKELETAL: Denies pain in neck, back, shoulder, knees, hips, or further arthritic symptoms.  NEUROLOGIC: Denies paralysis, paresthesias.  PSYCHIATRIC: Denies anxiety or depressive symptoms. Otherwise full review of systems performed by me is negative.   MEDICATIONS AT HOME:   Prior to Admission medications   Medication Sig Start Date End Date Taking? Authorizing Provider  albuterol (PROVENTIL HFA;VENTOLIN HFA) 108 (90 Base) MCG/ACT inhaler Inhale 2 puffs into the lungs every 6 (six) hours as needed for wheezing or shortness of breath.   Yes Historical Provider, MD  aspirin EC 81 MG tablet Take 81 mg by mouth daily.   Yes Historical Provider, MD  clopidogrel (PLAVIX) 75 MG tablet Take 75 mg by mouth daily.   Yes Historical Provider, MD  furosemide (LASIX) 40 MG tablet Take 40 mg by mouth 2 (two) times daily.   Yes Historical Provider, MD  gabapentin (NEURONTIN) 100 MG capsule Take 100 mg by mouth 2 (two) times daily.   Yes Historical Provider, MD  insulin glargine (LANTUS) 100 UNIT/ML injection Inject 40 Units into the skin at bedtime.   Yes Historical Provider, MD  isosorbide mononitrate (IMDUR) 30 MG 24 hr tablet Take 30 mg by mouth daily.   Yes Historical Provider, MD  lisinopril (PRINIVIL,ZESTRIL) 40 MG tablet Take 40 mg by mouth daily.   Yes Historical Provider, MD  metFORMIN (GLUCOPHAGE) 1000 MG tablet Take 1,000 mg by mouth 2 (two) times daily with a meal.   Yes Historical Provider, MD  metoprolol (LOPRESSOR) 50 MG tablet Take 50 mg by mouth 2 (two) times daily.   Yes Historical Provider, MD  Multiple Vitamin (MULTIVITAMIN WITH MINERALS) TABS tablet Take 1 tablet by mouth  daily.   Yes Historical Provider, MD  multivitamin-lutein (OCUVITE-LUTEIN) CAPS capsule Take 1 capsule by mouth daily.   Yes Historical Provider, MD  omeprazole (PRILOSEC) 20 MG capsule Take 20 mg by mouth daily.   Yes Historical Provider, MD  potassium chloride (K-DUR,KLOR-CON) 10 MEQ tablet Take 10 mEq by mouth daily.   Yes Historical Provider, MD  simvastatin (ZOCOR) 20 MG tablet Take 20 mg by mouth at bedtime.   Yes Historical Provider, MD      VITAL SIGNS:  Blood pressure 158/96, pulse 57, resp. rate 37, SpO2 100 %.  PHYSICAL EXAMINATION:  VITAL SIGNS: Filed Vitals:   12/03/15 1715 12/03/15 1730  BP: 168/102 158/96  Pulse: 128 57  Resp: 33 37   GENERAL:69 y.o.male currently critically ill on BiPAP  HEAD: Normocephalic, atraumatic.  EYES: Pupils equal, round, reactive to light. Extraocular muscles intact. No scleral icterus.  MOUTH: Moist mucosal membrane. Dentition intact. No abscess noted.  EAR, NOSE, THROAT: Clear without exudates. No external lesions.  NECK: Supple. No thyromegaly. No nodules. No JVD.  PULMONARY: Diminished breath sounds with scattered coarse rhonchi at bases  without wheeze tachypneic No use of accessory muscles, Good respiratory effort on BiPAP. good air entry bilaterally CHEST: Nontender to palpation.  CARDIOVASCULAR: S1 and S2. Regular rate and rhythm. No murmurs, rubs, or gallops. Trace edema. Pedal pulses 2+ bilaterally.  GASTROINTESTINAL: Soft, nontender, nondistended. No masses. Positive bowel sounds. No hepatosplenomegaly.  MUSCULOSKELETAL: No swelling, clubbing, or edema. Range of motion full in all extremities.  NEUROLOGIC: Cranial nerves II through XII are intact. No gross focal neurological deficits. Sensation intact. Reflexes intact.  SKIN: No ulceration, lesions, rashes, or cyanosis. Skin warm and dry. Turgor intact.  PSYCHIATRIC: Mood, affect within normal limits. The patient is awake, alert and oriented x 3. Insight, judgment intact.     LABORATORY PANEL:   CBC  Recent Labs Lab 12/03/15 1656  WBC 26.4*  HGB 17.9  HCT 56.6*  PLT 314   ------------------------------------------------------------------------------------------------------------------  Chemistries   Recent Labs Lab 12/03/15 1656  NA 137  K 3.4*  CL 102  CO2 23  GLUCOSE 271*  BUN 12  CREATININE 1.43*  CALCIUM 9.4   ------------------------------------------------------------------------------------------------------------------  Cardiac Enzymes  Recent Labs Lab 12/03/15 1656  TROPONINI 0.06*   ------------------------------------------------------------------------------------------------------------------  RADIOLOGY:  Dg Chest Port 1 View  12/03/2015  CLINICAL DATA:  Shortness of breath, diaphoresis, high blood pressure EXAM: PORTABLE CHEST 1 VIEW COMPARISON:  Portable exam 1659 hours compared to 04/28/2014 FINDINGS: Normal heart size post CABG. Atherosclerotic calcification aorta. Pulmonary vascular congestion. Interstitial infiltrates bilaterally slightly greater at RIGHT base. Tiny LEFT pleural effusion. No pneumothorax. Bones unremarkable. IMPRESSION: Post CABG with slight pulmonary vascular congestion and BILATERAL interstitial infiltrates question mild pulmonary edema and CHF though infection not completely excluded. Electronically Signed   By: Lavonia Dana M.D.   On: 12/03/2015 17:17    EKG:   Orders placed or performed in visit on 04/29/14  . EKG 12-Lead    IMPRESSION AND PLAN:   70 year old Caucasian gentleman history of coronary artery disease status post bypass, COPD and non-oxygen requiring presenting with respiratory distress.  1. Acute on chronic  respiratory failure with hypoxia secondary to flash pulmonary edema: Continue with IV Lasix for diuresis, supplemental oxygen, wean BiPAP as tolerated and decrease IPAP, continue home medication for blood pressure control will add hydralazine as needed 2. Elevated  troponin: Telemetry, trend cardiac enzymes 3 3. Type 2 diabetes insulin requiring: Continue basal insulin at insulin sliding scale coverage, hold oral agents 4. Coronary artery disease status post bypass continue with aspirin, statin, Plavix, metoprolol 5. GERD without esophagitis: PPI therapy 6. Venous thromboembolism prophylactic: Heparin subcutaneous   All the records are reviewed and case discussed with ED provider. Management plans discussed with the patient, family and they are in agreement.  CODE STATUS: Full  TOTAL TIME TAKING CARE OF THIS PATIENT: 50 critical care minutes.    Katsumi Wisler,  Karenann Cai.D on 12/03/2015 at 6:04 PM  Between 7am to 6pm - Pager - [phone]  After 6pm: House Pager: - [phone]  [facility]  Office  [phone]  CC: Primary care physician; No primary care provider on file.

## 2015-12-03 NOTE — ED Notes (Signed)
Troponin 0.06, Dr Clearnce Hasten

## 2015-12-04 ENCOUNTER — Encounter: Payer: Self-pay | Admitting: *Deleted

## 2015-12-04 DIAGNOSIS — J9621 Acute and chronic respiratory failure with hypoxia: Secondary | ICD-10-CM | POA: Diagnosis not present

## 2015-12-04 DIAGNOSIS — J810 Acute pulmonary edema: Secondary | ICD-10-CM | POA: Diagnosis not present

## 2015-12-04 DIAGNOSIS — E119 Type 2 diabetes mellitus without complications: Secondary | ICD-10-CM | POA: Diagnosis not present

## 2015-12-04 DIAGNOSIS — R7989 Other specified abnormal findings of blood chemistry: Secondary | ICD-10-CM | POA: Diagnosis not present

## 2015-12-04 LAB — GLUCOSE, CAPILLARY
GLUCOSE-CAPILLARY: 313 mg/dL — AB (ref 65–99)
GLUCOSE-CAPILLARY: 255 mg/dL — AB (ref 65–99)
GLUCOSE-CAPILLARY: 269 mg/dL — AB (ref 65–99)
GLUCOSE-CAPILLARY: 211 mg/dL — AB (ref 65–99)
GLUCOSE-CAPILLARY: 396 mg/dL — AB (ref 65–99)

## 2015-12-04 LAB — CBC
HEMATOCRIT: 46.2 % (ref 40.0–52.0)
HEMOGLOBIN: 15.3 g/dL (ref 13.0–18.0)
MCH: 29.6 pg (ref 26.0–34.0)
MCHC: 33.1 g/dL (ref 32.0–36.0)
MCV: 89.4 fL (ref 80.0–100.0)
Platelets: 230 10*3/uL (ref 150–440)
RBC: 5.17 MIL/uL (ref 4.40–5.90)
RDW: 15.6 % — ABNORMAL HIGH (ref 11.5–14.5)
WBC: 10.5 10*3/uL (ref 3.8–10.6)

## 2015-12-04 LAB — BASIC METABOLIC PANEL
Anion gap: 8 (ref 5–15)
BUN: 18 mg/dL (ref 6–20)
CO2: 26 mmol/L (ref 22–32)
CREATININE: 1.55 mg/dL — AB (ref 0.61–1.24)
Calcium: 8.9 mg/dL (ref 8.9–10.3)
Chloride: 100 mmol/L — ABNORMAL LOW (ref 101–111)
GFR calc Af Amer: 51 mL/min — ABNORMAL LOW (ref >60)
GFR calc non Af Amer: 44 mL/min — ABNORMAL LOW (ref >60)
GLUCOSE: 332 mg/dL — AB (ref 65–99)
Potassium: 4.3 mmol/L (ref 3.5–5.1)
Sodium: 134 mmol/L — ABNORMAL LOW (ref 135–145)

## 2015-12-04 LAB — MAGNESIUM: Magnesium: 2.1 mg/dL (ref 1.7–2.4)

## 2015-12-04 LAB — MRSA PCR SCREENING: MRSA by PCR: NEGATIVE

## 2015-12-04 LAB — HEMOGLOBIN A1C: Hgb A1c MFr Bld: 9.3 % — ABNORMAL HIGH (ref 4.0–6.0)

## 2015-12-04 LAB — TROPONIN I: Troponin I: 0.07 ng/mL — ABNORMAL HIGH (ref <0.031)

## 2015-12-04 MED ORDER — SODIUM CHLORIDE 0.9% FLUSH: 3.0000 mL | INTRAVENOUS | Status: DC | PRN

## 2015-12-04 MED ORDER — METHYLPREDNISOLONE SODIUM SUCC 40 MG IJ SOLR
40.0000 mg | Freq: Two times a day (BID) | INTRAMUSCULAR | Status: DC
  Administered 2015-12-04 – 2015-12-06 (×4): 40 mg via INTRAVENOUS
  Filled 2015-12-04 (×4): qty 1

## 2015-12-04 MED ORDER — LABETALOL HCL 5 MG/ML IV SOLN
10.0000 mg | INTRAVENOUS | Status: DC | PRN
  Administered 2015-12-04: 10 mg via INTRAVENOUS
  Filled 2015-12-04: qty 4

## 2015-12-04 MED ORDER — INSULIN GLARGINE 100 UNIT/ML ~~LOC~~ SOLN
20.0000 [IU] | Freq: Every day | SUBCUTANEOUS | Status: DC
  Administered 2015-12-04: 20 [IU] via SUBCUTANEOUS
  Filled 2015-12-04 (×2): qty 0.2

## 2015-12-04 MED ORDER — INSULIN ASPART 100 UNIT/ML ~~LOC~~ SOLN
0.0000 [IU] | Freq: Every day | SUBCUTANEOUS | Status: DC
  Administered 2015-12-04: 5 [IU] via SUBCUTANEOUS
  Administered 2015-12-05: 3 [IU] via SUBCUTANEOUS
  Administered 2015-12-06: 4 [IU] via SUBCUTANEOUS
  Filled 2015-12-04: qty 3
  Filled 2015-12-04: qty 5
  Filled 2015-12-04: qty 4
  Filled 2015-12-04: qty 3

## 2015-12-04 MED ORDER — INSULIN GLARGINE 100 UNIT/ML ~~LOC~~ SOLN
10.0000 [IU] | Freq: Every day | SUBCUTANEOUS | Status: DC
  Filled 2015-12-04: qty 0.1

## 2015-12-04 MED ORDER — INSULIN ASPART 100 UNIT/ML ~~LOC~~ SOLN
0.0000 [IU] | Freq: Three times a day (TID) | SUBCUTANEOUS | Status: DC
  Administered 2015-12-05: 8 [IU] via SUBCUTANEOUS
  Administered 2015-12-05: 3 [IU] via SUBCUTANEOUS
  Administered 2015-12-05: 15 [IU] via SUBCUTANEOUS
  Administered 2015-12-06: 10 [IU] via SUBCUTANEOUS
  Administered 2015-12-06 (×2): 5 [IU] via SUBCUTANEOUS
  Administered 2015-12-07: 2 [IU] via SUBCUTANEOUS
  Administered 2015-12-07: 5 [IU] via SUBCUTANEOUS
  Filled 2015-12-04: qty 3
  Filled 2015-12-04: qty 30
  Filled 2015-12-04 (×2): qty 5
  Filled 2015-12-04: qty 2
  Filled 2015-12-04: qty 5
  Filled 2015-12-04: qty 15

## 2015-12-04 NOTE — Progress Notes (Signed)
Paged Dr. Jannifer Franklin regarding nitro drip for patient BP. Nitro drip weaned off as ordered, PRN lebatolol ordered. Troponin check as well

## 2015-12-04 NOTE — Progress Notes (Signed)
eLink Physician-Brief Progress Note Patient Name: [name] DOB: [birth year] MRN: [id number]   Date of Service  12/04/2015  HPI/Events of Note  New patient evaluation, COPD exacerbation off BiPAP now.  eICU Interventions  Nothing further to add.     Intervention Category Major Interventions: Other:  Nazirah Tri 12/04/2015, 12:10 AM

## 2015-12-04 NOTE — Progress Notes (Signed)
Pt is alert and oriented, no c/o pain. NSR with frequent PVC's. Weaned off nitro drip at 0200. PRN lebatolol ordered for a systolic greater than 0000000. PT is currently on 6l nasal cannula. Off of bipap.

## 2015-12-04 NOTE — Progress Notes (Addendum)
Inpatient Diabetes Program Recommendations  AACE/ADA: New Consensus Statement on Inpatient Glycemic Control (2015)  Target Ranges:  Prepandial:   less than 140 mg/dL      Peak postprandial:   less than 180 mg/dL (1-2 hours)      Critically ill patients:  140 - 180 mg/dL  Results for [name], [facility] (MRN [id number]) as of 12/04/2015 10:04  Ref. Range 12/03/2015 23:42 12/04/2015 05:15 12/04/2015 08:01  Glucose-Capillary Latest Ref Range: 65-99 mg/dL 343 (H) 313 (H) 255 (H)   Review of Glycemic Control  Diabetes history: DM2 Outpatient Diabetes medications: Lantus 40 units QHS, Metformin 1000 mg BID Current orders for Inpatient glycemic control: Novolog 0-9 units Q6H  Inpatient Diabetes Program Recommendations: Insulin - Basal: Please consider ordering at least Lantus 20 units Q24H starting now (half of home dose). Insulin-Correction: If patient is eating well, please consider changing frequency of CBGs and Novolog to ACHS.  Thanks, Barnie Alderman, RN, MSN, CDE Diabetes Coordinator Inpatient Diabetes Program [phone] (Team Pager from [place] to [place]) [phone] ([place] office) [phone] [facility] office) [phone] [facility] office)

## 2015-12-04 NOTE — Progress Notes (Signed)
[facility]                                                                                                                                                                                            Patient Demographics   [name], is a 70 y.o. male, DOB - Apr 08, 1946, [id number]  Admit date - 12/03/2015   Admitting Physician Dustin Flock, MD  Outpatient Primary MD for the patient is No primary care provider on file.   LOS - 1  Subjective: Patient admitted with shortness of breath shortness of breath improved. Denies any chest pain denies any cough or wheezing     Review of Systems:   CONSTITUTIONAL: No documented fever. No fatigue, weakness. No weight gain, no weight loss.  EYES: No blurry or double vision.  ENT: No tinnitus. No postnasal drip. No redness of the oropharynx.  RESPIRATORY: No cough, no wheeze, no hemoptysis. Positive dyspnea.  CARDIOVASCULAR: No chest pain. No orthopnea. No palpitations. No syncope.  GASTROINTESTINAL: No nausea, no vomiting or diarrhea. No abdominal pain. No melena or hematochezia.  GENITOURINARY: No dysuria or hematuria.  ENDOCRINE: No polyuria or nocturia. No heat or cold intolerance.  HEMATOLOGY: No anemia. No bruising. No bleeding.  INTEGUMENTARY: No rashes. No lesions.  MUSCULOSKELETAL: No arthritis. No swelling. No gout.  NEUROLOGIC: No numbness, tingling, or ataxia. No seizure-type activity.  PSYCHIATRIC: No anxiety. No insomnia. No ADD.    Vitals:   Filed Vitals:   12/04/15 1000 12/04/15 1039 12/04/15 1100 12/04/15 1200  BP: 174/77 164/71 148/68 157/72  Pulse: 82 75 77 69  Temp:      TempSrc:      Resp: 26  16 20   Height:      Weight:      SpO2: 100%  96% 98%    Wt Readings from Last 3 Encounters:  12/04/15 79.6 kg (175 lb 7.8 oz)     Intake/Output Summary (Last 24 hours) at 12/04/15 1234 Last data filed at 12/04/15 1049  Gross per 24 hour  Intake   60.5 ml  Output    1025 ml  Net -964.5 ml    Physical Exam:   GENERAL: Pleasant-appearing in no apparent distress.  HEAD, EYES, EARS, NOSE AND THROAT: Atraumatic, normocephalic. Extraocular muscles are intact. Pupils equal and reactive to light. Sclerae anicteric. No conjunctival injection. No oro-pharyngeal erythema.  NECK: Supple. There is no jugular venous distention. No bruits, no lymphadenopathy, no thyromegaly.  HEART: Regular rate and rhythm,. No murmurs, no rubs, no clicks.  LUNGS: Bilateral crackles at the bases ABDOMEN: Soft, flat, nontender, nondistended. Has  good bowel sounds. No hepatosplenomegaly appreciated.  EXTREMITIES: No evidence of any cyanosis, clubbing, or peripheral edema.  +2 pedal and radial pulses bilaterally.  NEUROLOGIC: The patient is alert, awake, and oriented x3 with no focal motor or sensory deficits appreciated bilaterally.  SKIN: Moist and warm with no rashes appreciated.  Psych: Not anxious, depressed LN: No inguinal LN enlargement    Antibiotics   Anti-infectives    None      Medications   Scheduled Meds: . aspirin EC  81 mg Oral Daily  . clopidogrel  75 mg Oral Daily  . furosemide  40 mg Intravenous BID  . gabapentin  100 mg Oral BID  . heparin  5,000 Units Subcutaneous 3 times per day  . insulin aspart  0-9 Units Subcutaneous Q6H  . isosorbide mononitrate  30 mg Oral Daily  . lisinopril  40 mg Oral Daily  . metoprolol  50 mg Oral BID  . multivitamin with minerals  1 tablet Oral Daily  . nicotine  21 mg Transdermal Daily  . pantoprazole  40 mg Oral QAC breakfast  . simvastatin  20 mg Oral QHS  . sodium chloride flush  3 mL Intravenous Q12H   Continuous Infusions:  PRN Meds:.acetaminophen **OR** acetaminophen, ipratropium-albuterol, labetalol, morphine injection, ondansetron **OR** ondansetron (ZOFRAN) IV, oxyCODONE   Data Review:   Micro Results Recent Results (from the past 240 hour(s))  MRSA PCR Screening     Status: None   Collection Time:  12/03/15 11:43 PM  Result Value Ref Range Status   MRSA by PCR NEGATIVE NEGATIVE Final    Comment:        The GeneXpert MRSA Assay (FDA approved for NASAL specimens only), is one component of a comprehensive MRSA colonization surveillance program. It is not intended to diagnose MRSA infection nor to guide or monitor treatment for MRSA infections.     Radiology Reports Dg Chest Port 1 View  12/03/2015  CLINICAL DATA:  Shortness of breath, diaphoresis, high blood pressure EXAM: PORTABLE CHEST 1 VIEW COMPARISON:  Portable exam 1659 hours compared to 04/28/2014 FINDINGS: Normal heart size post CABG. Atherosclerotic calcification aorta. Pulmonary vascular congestion. Interstitial infiltrates bilaterally slightly greater at RIGHT base. Tiny LEFT pleural effusion. No pneumothorax. Bones unremarkable. IMPRESSION: Post CABG with slight pulmonary vascular congestion and BILATERAL interstitial infiltrates question mild pulmonary edema and CHF though infection not completely excluded. Electronically Signed   By: Lavonia Dana M.D.   On: 12/03/2015 17:17     CBC  Recent Labs Lab 12/03/15 1656 12/04/15 0338  WBC 26.4* 10.5  HGB 17.9 15.3  HCT 56.6* 46.2  PLT 314 230  MCV 90.3 89.4  MCH 28.6 29.6  MCHC 31.7* 33.1  RDW 15.8* 15.6*  LYMPHSABS 5.4*  --   MONOABS 1.8*  --   EOSABS 0.1  --   BASOSABS 0.2*  --     Chemistries   Recent Labs Lab 12/03/15 1656 12/04/15 0338  NA 137 134*  K 3.4* 4.3  CL 102 100*  CO2 23 26  GLUCOSE 271* 332*  BUN 12 18  CREATININE 1.43* 1.55*  CALCIUM 9.4 8.9   ------------------------------------------------------------------------------------------------------------------ estimated creatinine clearance is 44 mL/min (by C-G formula based on Cr of 1.55). ------------------------------------------------------------------------------------------------------------------ No results for input(s): HGBA1C in the last 72  hours. ------------------------------------------------------------------------------------------------------------------ No results for input(s): CHOL, HDL, LDLCALC, TRIG, CHOLHDL, LDLDIRECT in the last 72 hours. ------------------------------------------------------------------------------------------------------------------ No results for input(s): TSH, T4TOTAL, T3FREE, THYROIDAB in the last 72 hours.  Invalid input(s): FREET3 ------------------------------------------------------------------------------------------------------------------  No results for input(s): VITAMINB12, FOLATE, FERRITIN, TIBC, IRON, RETICCTPCT in the last 72 hours.  Coagulation profile No results for input(s): INR, PROTIME in the last 168 hours.  No results for input(s): DDIMER in the last 72 hours.  Cardiac Enzymes  Recent Labs Lab 12/03/15 1656 12/04/15 0338  TROPONINI 0.06* 0.07*   ------------------------------------------------------------------------------------------------------------------ Invalid input(s): POCBNP    Assessment & Plan   70 year old Caucasian gentleman history of coronary artery disease status post bypass, COPD and non-oxygen requiring presenting with respiratory distress.  1. Acute on chronic respiratory failure with hypoxia secondary to flash pulmonary edema And possible COPD exasperation continue Lasix therapy will ask his cardiologist to come , tingling in the absence  2. Elevated troponin: Likely due to demand ischemia aspirin 3. Type 2 diabetes insulin requiring:  Blood sugars in the 300s check a hemoglobin A1c and I will add low-dose Lantus 4. Coronary artery disease status post bypass continue with aspirin, statin, Plavix, metoprolol 5. GERD without esophagitis: PPI therapy 6. Venous thromboembolism prophylactic: Heparin subcutaneous     Code Status Orders        Start     Ordered   12/03/15 1730  Full code   Continuous     12/03/15 1730    Code Status History     Date Active Date Inactive Code Status Order ID Comments User Context   This patient has a current code status but no historical code status.           Consults  cardiology DVT Prophylaxis  heparin  Lab Results  Component Value Date   PLT 230 12/04/2015     Time Spent in minutes   34min     Dustin Flock M.D on 12/04/2015 at 12:34 PM  Between 7am to 6pm - Pager - [phone]  After 6pm go to [email or web address] - password [email or web address] [facility]   Office  [phone]

## 2015-12-05 ENCOUNTER — Inpatient Hospital Stay: Payer: PPO

## 2015-12-05 DIAGNOSIS — R0602 Shortness of breath: Secondary | ICD-10-CM | POA: Diagnosis not present

## 2015-12-05 DIAGNOSIS — E119 Type 2 diabetes mellitus without complications: Secondary | ICD-10-CM | POA: Diagnosis not present

## 2015-12-05 DIAGNOSIS — R7989 Other specified abnormal findings of blood chemistry: Secondary | ICD-10-CM | POA: Diagnosis not present

## 2015-12-05 DIAGNOSIS — J9621 Acute and chronic respiratory failure with hypoxia: Secondary | ICD-10-CM | POA: Diagnosis not present

## 2015-12-05 DIAGNOSIS — J810 Acute pulmonary edema: Secondary | ICD-10-CM | POA: Diagnosis not present

## 2015-12-05 LAB — GLUCOSE, CAPILLARY
GLUCOSE-CAPILLARY: 259 mg/dL — AB (ref 65–99)
Glucose-Capillary: 424 mg/dL — ABNORMAL HIGH (ref 65–99)
Glucose-Capillary: 191 mg/dL — ABNORMAL HIGH (ref 65–99)
Glucose-Capillary: 254 mg/dL — ABNORMAL HIGH (ref 65–99)

## 2015-12-05 LAB — CBC
HCT: 42.9 % (ref 40.0–52.0)
Hemoglobin: 14.2 g/dL (ref 13.0–18.0)
MCH: 29.1 pg (ref 26.0–34.0)
MCHC: 33.1 g/dL (ref 32.0–36.0)
MCV: 87.8 fL (ref 80.0–100.0)
PLATELETS: 252 10*3/uL (ref 150–440)
RBC: 4.89 MIL/uL (ref 4.40–5.90)
RDW: 15.3 % — ABNORMAL HIGH (ref 11.5–14.5)
WBC: 15.9 10*3/uL — ABNORMAL HIGH (ref 3.8–10.6)

## 2015-12-05 LAB — BASIC METABOLIC PANEL
Anion gap: 5 (ref 5–15)
BUN: 32 mg/dL — AB (ref 6–20)
CALCIUM: 8.9 mg/dL (ref 8.9–10.3)
CO2: 30 mmol/L (ref 22–32)
CREATININE: 1.5 mg/dL — AB (ref 0.61–1.24)
Chloride: 102 mmol/L (ref 101–111)
GFR calc Af Amer: 53 mL/min — ABNORMAL LOW (ref >60)
GFR calc non Af Amer: 45 mL/min — ABNORMAL LOW (ref >60)
GLUCOSE: 305 mg/dL — AB (ref 65–99)
POTASSIUM: 4.3 mmol/L (ref 3.5–5.1)
SODIUM: 137 mmol/L (ref 135–145)

## 2015-12-05 MED ORDER — DIPHENHYDRAMINE HCL 25 MG PO CAPS
ORAL_CAPSULE | ORAL | Status: AC
  Administered 2015-12-05: 25 mg via ORAL
  Filled 2015-12-05: qty 1

## 2015-12-05 MED ORDER — IPRATROPIUM-ALBUTEROL 0.5-2.5 (3) MG/3ML IN SOLN
3.0000 mL | Freq: Four times a day (QID) | RESPIRATORY_TRACT | Status: DC
  Administered 2015-12-05 – 2015-12-07 (×7): 3 mL via RESPIRATORY_TRACT
  Filled 2015-12-05 (×7): qty 3

## 2015-12-05 MED ORDER — INSULIN ASPART 100 UNIT/ML ~~LOC~~ SOLN
15.0000 [IU] | SUBCUTANEOUS | Status: AC
  Administered 2015-12-05: 15 [IU] via SUBCUTANEOUS
  Filled 2015-12-05: qty 15

## 2015-12-05 MED ORDER — ISOSORBIDE MONONITRATE ER 60 MG PO TB24
60.0000 mg | ORAL_TABLET | Freq: Every day | ORAL | Status: DC
  Administered 2015-12-06 – 2015-12-07 (×2): 60 mg via ORAL
  Filled 2015-12-05 (×2): qty 1

## 2015-12-05 MED ORDER — FUROSEMIDE 10 MG/ML IJ SOLN
20.0000 mg | Freq: Two times a day (BID) | INTRAMUSCULAR | Status: DC
  Administered 2015-12-05 – 2015-12-07 (×4): 20 mg via INTRAVENOUS
  Filled 2015-12-05 (×4): qty 2

## 2015-12-05 MED ORDER — BUDESONIDE 0.25 MG/2ML IN SUSP
0.2500 mg | Freq: Two times a day (BID) | RESPIRATORY_TRACT | Status: DC
  Administered 2015-12-06 – 2015-12-07 (×3): 0.25 mg via RESPIRATORY_TRACT
  Filled 2015-12-05 (×3): qty 2

## 2015-12-05 MED ORDER — DIPHENHYDRAMINE HCL 25 MG PO CAPS
25.0000 mg | ORAL_CAPSULE | Freq: Four times a day (QID) | ORAL | Status: DC | PRN
  Administered 2015-12-05: 25 mg via ORAL

## 2015-12-05 MED ORDER — GUAIFENESIN ER 600 MG PO TB12
600.0000 mg | ORAL_TABLET | Freq: Two times a day (BID) | ORAL | Status: DC
  Administered 2015-12-05 – 2015-12-07 (×5): 600 mg via ORAL
  Filled 2015-12-05 (×5): qty 1

## 2015-12-05 MED ORDER — INSULIN GLARGINE 100 UNIT/ML ~~LOC~~ SOLN
26.0000 [IU] | Freq: Every day | SUBCUTANEOUS | Status: DC
  Administered 2015-12-05: 26 [IU] via SUBCUTANEOUS
  Filled 2015-12-05 (×2): qty 0.26

## 2015-12-05 MED ORDER — LEVOFLOXACIN IN D5W 500 MG/100ML IV SOLN
500.0000 mg | Freq: Once | INTRAVENOUS | Status: AC
  Administered 2015-12-05: 500 mg via INTRAVENOUS
  Filled 2015-12-05: qty 100

## 2015-12-05 MED ORDER — LEVOFLOXACIN IN D5W 250 MG/50ML IV SOLN
250.0000 mg | INTRAVENOUS | Status: DC
  Administered 2015-12-05: 250 mg via INTRAVENOUS
  Filled 2015-12-05: qty 50

## 2015-12-05 NOTE — Progress Notes (Signed)
3 L ofoxygen. NSR. FS are stable. Pt did not report any pain. Meds ok. A & O. Pt has no further concerns at this time.

## 2015-12-05 NOTE — Care Management Note (Addendum)
Case Management Note  Patient Details  Name: [name] MRN: [id number] Date of Birth: 09-17-1946  Subjective/Objective:    Met with spouse at bedside. Patient slept during entire assessment. Admitted with CHF, COPD exacerbation and possible PNA. On IV lasix, IV antibiotics.  Not on home O2. No home health. Uses a cane and still drives almost daily according to wife. Patient had a stroke several years ago and has visual deficits. Wife states he does not need to drive but continues to go out to the bank, [place], out to eat on a regular basis. PCP is Dr. Loney Hering. Contact card for RNCM left with wife.                 Action/Plan: Following progression and will assess need for home O2/home health needs as patient progresses.   Expected Discharge Date:                  Expected Discharge Plan:  Home/Self Care  In-House Referral:     Discharge planning Services  CM Consult  Post Acute Care Choice:    Choice offered to:     DME Arranged:    DME Agency:     HH Arranged:    HH Agency:     Status of Service:  In process, will continue to follow  Medicare Important Message Given:  Yes Date Medicare IM Given:    Medicare IM give by:    Date Additional Medicare IM Given:    Additional Medicare Important Message give by:     If discussed at [place] of Stay Meetings, dates discussed:    Additional Comments:  Jolly Mango, RN 12/05/2015, 3:26 PM

## 2015-12-05 NOTE — Care Management Important Message (Signed)
Important Message  Patient Details  Name: [name] MRN: [id number] Date of Birth: [birth year]   Medicare Important Message Given:  Yes    Juliann Pulse A [place] Kiss 12/05/2015, 11:19 AM

## 2015-12-05 NOTE — Consult Note (Signed)
Pharmacy Antibiotic Note  [name] is a 70 y.o. male admitted on 12/03/2015 with pneumonia.  Pharmacy has been consulted for levofloxacin dosing.  Plan: Patient initiated on levofloxacin 500mg  PO x1 and then levofloxacin 250mg  PO daily thereafter based on renal function (CrCl <50 ml/min).  Height: 5\' 6"  (167.6 cm) Weight: 175 lb 7.8 oz (79.6 kg) IBW/kg (Calculated) : 63.8  Temp (24hrs), Avg:98.2 F (36.8 C), Min:97.8 F (36.6 C), Max:98.6 F (37 C)   Recent Labs Lab 12/03/15 1656 12/04/15 0338 12/05/15 0543  WBC 26.4* 10.5 15.9*  CREATININE 1.43* 1.55* 1.50*    Estimated Creatinine Clearance: 45.4 mL/min (by C-G formula based on Cr of 1.5).    No Known Allergies  Antimicrobials this admission: Anti-infectives    Start     Dose/Rate Route Frequency Ordered Stop   12/06/15 1000  Levofloxacin (LEVAQUIN) IVPB 250 mg     250 mg 50 mL/hr over 60 Minutes Intravenous Every 24 hours 12/05/15 1010     12/05/15 1015  levofloxacin (LEVAQUIN) IVPB 500 mg     500 mg 100 mL/hr over 60 Minutes Intravenous  Once 12/05/15 1004        Microbiology results: Results for orders placed or performed during the hospital encounter of 12/03/15  MRSA PCR Screening     Status: None   Collection Time: 12/03/15 11:43 PM  Result Value Ref Range Status   MRSA by PCR NEGATIVE NEGATIVE Final    Comment:        The GeneXpert MRSA Assay (FDA approved for NASAL specimens only), is one component of a comprehensive MRSA colonization surveillance program. It is not intended to diagnose MRSA infection nor to guide or monitor treatment for MRSA infections.     Thank you for allowing pharmacy to be a part of this patient's care.  Roe Coombs, PharmD Pharmacy Resident 12/05/2015

## 2015-12-05 NOTE — Progress Notes (Signed)
[place] at [facility]                                                                                                                                                                                            Patient Demographics   [name], is a 70 y.o. male, DOB - 05/18/1946, [id number]  Admit date - 12/03/2015   Admitting Physician Dustin Flock, MD  Outpatient Primary MD for the patient is No primary care provider on file.   LOS - 2  Subjective: She continues to complain of shortness of breath and cough he states that unable to break up the cough has wheezing    Review of Systems:   CONSTITUTIONAL: No documented fever. No fatigue, weakness. No weight gain, no weight loss.  EYES: No blurry or double vision.  ENT: No tinnitus. No postnasal drip. No redness of the oropharynx.  RESPIRATORY: Positive cough positive wheezing. Positive dyspnea.  CARDIOVASCULAR: No chest pain. No orthopnea. No palpitations. No syncope.  GASTROINTESTINAL: No nausea, no vomiting or diarrhea. No abdominal pain. No melena or hematochezia.  GENITOURINARY: No dysuria or hematuria.  ENDOCRINE: No polyuria or nocturia. No heat or cold intolerance.  HEMATOLOGY: No anemia. No bruising. No bleeding.  INTEGUMENTARY: No rashes. No lesions.  MUSCULOSKELETAL: No arthritis. No swelling. No gout.  NEUROLOGIC: No numbness, tingling, or ataxia. No seizure-type activity.  PSYCHIATRIC: No anxiety. No insomnia. No ADD.    Vitals:   Filed Vitals:   12/04/15 1635 12/04/15 2156 12/05/15 0446 12/05/15 0753  BP: 143/77 155/63 155/66 165/93  Pulse: 71 73 62 69  Temp: 98.6 F (37 C) 98.2 F (36.8 C) 97.8 F (36.6 C)   TempSrc: Oral Oral Oral   Resp:  19 19   Height:      Weight:      SpO2: 95% 98% 98% 96%    Wt Readings from Last 3 Encounters:  12/04/15 79.6 kg (175 lb 7.8 oz)     Intake/Output Summary (Last 24 hours) at 12/05/15 1011 Last data filed at 12/05/15  0759  Gross per 24 hour  Intake      6 ml  Output   1050 ml  Net  -1044 ml    Physical Exam:   GENERAL: Pleasant-appearing in no apparent distress.  HEAD, EYES, EARS, NOSE AND THROAT: Atraumatic, normocephalic. Extraocular muscles are intact. Pupils equal and reactive to light. Sclerae anicteric. No conjunctival injection. No oro-pharyngeal erythema.  NECK: Supple. There is no jugular venous distention. No bruits, no lymphadenopathy, no thyromegaly.  HEART: Regular rate and rhythm,. No murmurs, no rubs, no clicks.  LUNGS: Bilateral bilateral wheezing throughout both lungs ABDOMEN: Soft, flat, nontender, nondistended. Has good bowel sounds. No hepatosplenomegaly appreciated.  EXTREMITIES: No evidence of any cyanosis, clubbing, or peripheral edema.  +2 pedal and radial pulses bilaterally.  NEUROLOGIC: The patient is alert, awake, and oriented x3 with no focal motor or sensory deficits appreciated bilaterally.  SKIN: Moist and warm with no rashes appreciated.  Psych: Not anxious, depressed LN: No inguinal LN enlargement    Antibiotics   Anti-infectives    Start     Dose/Rate Route Frequency Ordered Stop   12/06/15 1000  Levofloxacin (LEVAQUIN) IVPB 250 mg     250 mg 50 mL/hr over 60 Minutes Intravenous Every 24 hours 12/05/15 1010     12/05/15 1015  levofloxacin (LEVAQUIN) IVPB 500 mg     500 mg 100 mL/hr over 60 Minutes Intravenous  Once 12/05/15 1004        Medications   Scheduled Meds: . aspirin EC  81 mg Oral Daily  . budesonide (PULMICORT) nebulizer solution  0.25 mg Nebulization BID  . clopidogrel  75 mg Oral Daily  . furosemide  20 mg Intravenous BID  . gabapentin  100 mg Oral BID  . guaiFENesin  600 mg Oral BID  . heparin  5,000 Units Subcutaneous 3 times per day  . insulin aspart  0-15 Units Subcutaneous TID WC  . insulin aspart  0-5 Units Subcutaneous QHS  . insulin glargine  20 Units Subcutaneous QHS  . ipratropium-albuterol  3 mL Nebulization Q6H  . [START ON  12/06/2015] isosorbide mononitrate  60 mg Oral Daily  . [START ON 12/06/2015] levofloxacin (LEVAQUIN) IV  250 mg Intravenous Q24H  . levofloxacin (LEVAQUIN) IV  500 mg Intravenous Once  . lisinopril  40 mg Oral Daily  . methylPREDNISolone (SOLU-MEDROL) injection  40 mg Intravenous Q12H  . metoprolol  50 mg Oral BID  . multivitamin with minerals  1 tablet Oral Daily  . nicotine  21 mg Transdermal Daily  . pantoprazole  40 mg Oral QAC breakfast  . simvastatin  20 mg Oral QHS   Continuous Infusions:  PRN Meds:.acetaminophen **OR** acetaminophen, labetalol, morphine injection, ondansetron **OR** ondansetron (ZOFRAN) IV, oxyCODONE, sodium chloride flush   Data Review:   Micro Results Recent Results (from the past 240 hour(s))  MRSA PCR Screening     Status: None   Collection Time: 12/03/15 11:43 PM  Result Value Ref Range Status   MRSA by PCR NEGATIVE NEGATIVE Final    Comment:        The GeneXpert MRSA Assay (FDA approved for NASAL specimens only), is one component of a comprehensive MRSA colonization surveillance program. It is not intended to diagnose MRSA infection nor to guide or monitor treatment for MRSA infections.     Radiology Reports Dg Chest 2 View  12/05/2015  CLINICAL DATA:  Shortness of breath, history of COPD, coronary artery disease, current smoker. Acute and chronic respiratory failure. EXAM: CHEST  2 VIEW COMPARISON:  Portable chest x-ray of December 03, 2015 FINDINGS: The lungs remain hyperinflated. The pulmonary interstitial markings while increased are less conspicuous than on the earlier study. A small left pleural effusion remains. The cardiac silhouette is top-normal in size. The pulmonary vascularity is not engorged. There are 7 intact sternal wires. The bony thorax exhibits no acute abnormality. IMPRESSION: COPD with improving pulmonary interstitial edema. Stable mild cardiomegaly and central pulmonary vascular congestion. No discrete pneumonia is observed.  Persistent small left pleural effusion. Electronically Signed   By:  David  Martinique M.D.   On: 12/05/2015 07:50   Dg Chest Port 1 View  12/03/2015  CLINICAL DATA:  Shortness of breath, diaphoresis, high blood pressure EXAM: PORTABLE CHEST 1 VIEW COMPARISON:  Portable exam 1659 hours compared to 04/28/2014 FINDINGS: Normal heart size post CABG. Atherosclerotic calcification aorta. Pulmonary vascular congestion. Interstitial infiltrates bilaterally slightly greater at RIGHT base. Tiny LEFT pleural effusion. No pneumothorax. Bones unremarkable. IMPRESSION: Post CABG with slight pulmonary vascular congestion and BILATERAL interstitial infiltrates question mild pulmonary edema and CHF though infection not completely excluded. Electronically Signed   By: Lavonia Dana M.D.   On: 12/03/2015 17:17     CBC  Recent Labs Lab 12/03/15 1656 12/04/15 0338 12/05/15 0543  WBC 26.4* 10.5 15.9*  HGB 17.9 15.3 14.2  HCT 56.6* 46.2 42.9  PLT 314 230 252  MCV 90.3 89.4 87.8  MCH 28.6 29.6 29.1  MCHC 31.7* 33.1 33.1  RDW 15.8* 15.6* 15.3*  LYMPHSABS 5.4*  --   --   MONOABS 1.8*  --   --   EOSABS 0.1  --   --   BASOSABS 0.2*  --   --     Chemistries   Recent Labs Lab 12/03/15 1656 12/04/15 0338 12/05/15 0543  NA 137 134* 137  K 3.4* 4.3 4.3  CL 102 100* 102  CO2 23 26 30   GLUCOSE 271* 332* 305*  BUN 12 18 32*  CREATININE 1.43* 1.55* 1.50*  CALCIUM 9.4 8.9 8.9  MG  --  2.1  --    ------------------------------------------------------------------------------------------------------------------ estimated creatinine clearance is 45.4 mL/min (by C-G formula based on Cr of 1.5). ------------------------------------------------------------------------------------------------------------------  Recent Labs  12/04/15 0338  HGBA1C 9.3*   ------------------------------------------------------------------------------------------------------------------ No results for input(s): CHOL, HDL, LDLCALC,  TRIG, CHOLHDL, LDLDIRECT in the last 72 hours. ------------------------------------------------------------------------------------------------------------------ No results for input(s): TSH, T4TOTAL, T3FREE, THYROIDAB in the last 72 hours.  Invalid input(s): FREET3 ------------------------------------------------------------------------------------------------------------------ No results for input(s): VITAMINB12, FOLATE, FERRITIN, TIBC, IRON, RETICCTPCT in the last 72 hours.  Coagulation profile No results for input(s): INR, PROTIME in the last 168 hours.  No results for input(s): DDIMER in the last 72 hours.  Cardiac Enzymes  Recent Labs Lab 12/03/15 1656 12/04/15 0338  TROPONINI 0.06* 0.07*   ------------------------------------------------------------------------------------------------------------------ Invalid input(s): POCBNP    Assessment & Plan   71 year old Caucasian gentleman history of coronary artery disease status post bypass, COPD and non-oxygen requiring presenting with respiratory distress.  1. Acute on chronic respiratory failure with hypoxia secondary to flash pulmonary edema And possible COPD exasperation as well as possible pneumonia  Changes Lasix dose 20 IV twice a day, add mucinex to threapy, add antibitoics to current therapy Add budesonide nebs 2. Elevated troponin: Likely due to demand ischemia aspirin 3. Type 2 diabetes insulin requiring:  Blood sugars in the 300s increase Lantus dose  4. Coronary artery disease status post bypass continue with aspirin, statin, Plavix, metoprolol 5. GERD without esophagitis: PPI therapy 6. Venous thromboembolism prophylactic: Heparin subcutaneous     Code Status Orders        Start     Ordered   12/03/15 1730  Full code   Continuous     12/03/15 1730    Code Status History    Date Active Date Inactive Code Status Order ID Comments User Context   This patient has a current code status but no historical  code status.           Consults  cardiology DVT Prophylaxis  heparin  Lab  Results  Component Value Date   PLT 252 12/05/2015     Time Spent in minutes   36min    Abagail Limb M.D on 12/05/2015 at 10:11 AM  Between 7am to 6pm - Pager - [phone]  After 6pm go to [email or web address] - password [email or web address] [place] [facility]   Office  [phone]

## 2015-12-05 NOTE — Progress Notes (Signed)
Inpatient Diabetes Program Recommendations  AACE/ADA: New Consensus Statement on Inpatient Glycemic Control (2015)  Target Ranges:  Prepandial:   less than 140 mg/dL      Peak postprandial:   less than 180 mg/dL (1-2 hours)      Critically ill patients:  140 - 180 mg/dL  Results for [name], [name] (MRN [id number]) as of 12/05/2015 07:49  Ref. Range 12/04/2015 08:01 12/04/2015 11:01 12/04/2015 16:38 12/04/2015 21:03 12/05/2015 07:40  Glucose-Capillary Latest Ref Range: 65-99 mg/dL 255 (H) 269 (H) 211 (H) 396 (H) 259 (H)   Review of Glycemic Control  Diabetes history: DM2 Outpatient Diabetes medications: Lantus 40 units QHS, Metformin 1000 mg BID Current orders for Inpatient glycemic control: Lantus 20 units QHS, Novolog 0-15 units TID with meals, Novolog 0-5 units QHS  Inpatient Diabetes Program Recommendations: Insulin - Basal: Please consider increasing Lantus to 25 units QHS. Insulin - Meal Coverage: If steroids are continued as ordered (Solumedrol 40 mg Q12H), please consider ordering Novolog 4 units TID with meals for meal coverage (in addition to Novolog correction scale). A1C: A1C is 9.3% on 12/04/15 indicating an average glucose of 220 mg/dl over the past 2-3 months. Patient needs to follow up with PCP regarding glycemic control.  Thanks, Barnie Alderman, RN, MSN, CDE Diabetes Coordinator Inpatient Diabetes Program [phone] (Team Pager from [place] to [place]) [phone] ([facility] office) [phone] [facility] office) [phone] [facility] office)'

## 2015-12-05 NOTE — Progress Notes (Signed)
MD patel was notified that FS [id number], order was to give 30 units of novolog.

## 2015-12-05 NOTE — Clinical Documentation Improvement (Signed)
Hospitalist  Can the diagnosis of CHF be further specified?    Acuity - Acute, Chronic, Acute on Chronic   Type - Systolic, Diastolic, Systolic and Diastolic   Acute pulmonary edema only  Other  Clinically Undetermined   Document any associated diagnoses/conditions   Supporting Information: In medical record it states that pt has CHF. BNP is 3118.  CXR states pulmonary edema and CHF. Flash pulmonary edema is an old term.     Please exercise your independent, professional judgment when responding. A specific answer is not anticipated or expected.   Thank You,  [place] [phone]

## 2015-12-06 DIAGNOSIS — J9621 Acute and chronic respiratory failure with hypoxia: Secondary | ICD-10-CM | POA: Diagnosis not present

## 2015-12-06 DIAGNOSIS — R7989 Other specified abnormal findings of blood chemistry: Secondary | ICD-10-CM | POA: Diagnosis not present

## 2015-12-06 DIAGNOSIS — J810 Acute pulmonary edema: Secondary | ICD-10-CM | POA: Diagnosis not present

## 2015-12-06 DIAGNOSIS — E119 Type 2 diabetes mellitus without complications: Secondary | ICD-10-CM | POA: Diagnosis not present

## 2015-12-06 LAB — GLUCOSE, CAPILLARY
GLUCOSE-CAPILLARY: 233 mg/dL — AB (ref 65–99)
GLUCOSE-CAPILLARY: 428 mg/dL — AB (ref 65–99)
Glucose-Capillary: 246 mg/dL — ABNORMAL HIGH (ref 65–99)
Glucose-Capillary: 305 mg/dL — ABNORMAL HIGH (ref 65–99)

## 2015-12-06 LAB — CBC
HCT: 44.2 % (ref 40.0–52.0)
Hemoglobin: 14.6 g/dL (ref 13.0–18.0)
MCH: 29.1 pg (ref 26.0–34.0)
MCHC: 33 g/dL (ref 32.0–36.0)
MCV: 88.2 fL (ref 80.0–100.0)
PLATELETS: 253 10*3/uL (ref 150–440)
RBC: 5.01 MIL/uL (ref 4.40–5.90)
RDW: 15.4 % — ABNORMAL HIGH (ref 11.5–14.5)
WBC: 14.5 10*3/uL — AB (ref 3.8–10.6)

## 2015-12-06 LAB — BASIC METABOLIC PANEL
ANION GAP: 8 (ref 5–15)
BUN: 37 mg/dL — ABNORMAL HIGH (ref 6–20)
CALCIUM: 9 mg/dL (ref 8.9–10.3)
CO2: 31 mmol/L (ref 22–32)
CREATININE: 1.5 mg/dL — AB (ref 0.61–1.24)
Chloride: 97 mmol/L — ABNORMAL LOW (ref 101–111)
GFR, EST AFRICAN AMERICAN: 53 mL/min — AB (ref >60)
GFR, EST NON AFRICAN AMERICAN: 45 mL/min — AB (ref >60)
Glucose, Bld: 229 mg/dL — ABNORMAL HIGH (ref 65–99)
Potassium: 5 mmol/L (ref 3.5–5.1)
SODIUM: 136 mmol/L (ref 135–145)

## 2015-12-06 MED ORDER — AZITHROMYCIN 250 MG PO TABS
500.0000 mg | ORAL_TABLET | Freq: Every day | ORAL | Status: DC
  Administered 2015-12-06 – 2015-12-07 (×2): 500 mg via ORAL
  Filled 2015-12-06 (×2): qty 2

## 2015-12-06 MED ORDER — INSULIN ASPART 100 UNIT/ML ~~LOC~~ SOLN
20.0000 [IU] | Freq: Once | SUBCUTANEOUS | Status: AC
  Administered 2015-12-06: 20 [IU] via SUBCUTANEOUS

## 2015-12-06 MED ORDER — INSULIN GLARGINE 100 UNIT/ML ~~LOC~~ SOLN
35.0000 [IU] | Freq: Every day | SUBCUTANEOUS | Status: DC
Start: 2015-12-06 — End: 2015-12-07
  Administered 2015-12-06: 35 [IU] via SUBCUTANEOUS
  Filled 2015-12-06 (×3): qty 0.35

## 2015-12-06 MED ORDER — DOCUSATE SODIUM 100 MG PO CAPS
200.0000 mg | ORAL_CAPSULE | Freq: Two times a day (BID) | ORAL | Status: DC
  Administered 2015-12-06 – 2015-12-07 (×3): 200 mg via ORAL
  Filled 2015-12-06 (×3): qty 2

## 2015-12-06 MED ORDER — PREDNISONE 10 MG PO TABS
60.0000 mg | ORAL_TABLET | Freq: Every day | ORAL | Status: DC
  Administered 2015-12-07: 60 mg via ORAL
  Filled 2015-12-06: qty 1

## 2015-12-06 MED ORDER — CEFUROXIME AXETIL 500 MG PO TABS
500.0000 mg | ORAL_TABLET | Freq: Two times a day (BID) | ORAL | Status: DC
  Administered 2015-12-06 – 2015-12-07 (×3): 500 mg via ORAL
  Filled 2015-12-06 (×3): qty 1

## 2015-12-06 MED ORDER — SENNA 8.6 MG PO TABS
1.0000 | ORAL_TABLET | Freq: Every day | ORAL | Status: DC
  Administered 2015-12-06 – 2015-12-07 (×2): 8.6 mg via ORAL
  Filled 2015-12-06 (×2): qty 1

## 2015-12-06 MED ORDER — ENOXAPARIN SODIUM 40 MG/0.4ML ~~LOC~~ SOLN
40.0000 mg | SUBCUTANEOUS | Status: DC
  Administered 2015-12-06: 40 mg via SUBCUTANEOUS
  Filled 2015-12-06: qty 0.4

## 2015-12-06 NOTE — Progress Notes (Signed)
[place] at [facility]                                                                                                                                                                                            Patient Demographics   [name], is a 70 y.o. male, DOB - 24-Feb-1946, [id number]  Admit date - 12/03/2015   Admitting Physician Dustin Flock, MD  Outpatient Primary MD for the patient is No primary care provider on file.   LOS - 3  Subjective:  He feels better shortness of breath improved still coughing denies any chest pains  Review of Systems:   CONSTITUTIONAL: No documented fever. No fatigue, weakness. No weight gain, no weight loss.  EYES: No blurry or double vision.  ENT: No tinnitus. No postnasal drip. No redness of the oropharynx.  RESPIRATORY: Positive cough positive wheezing. Positive dyspnea.  CARDIOVASCULAR: No chest pain. No orthopnea. No palpitations. No syncope.  GASTROINTESTINAL: No nausea, no vomiting or diarrhea. No abdominal pain. No melena or hematochezia.  GENITOURINARY: No dysuria or hematuria.  ENDOCRINE: No polyuria or nocturia. No heat or cold intolerance.  HEMATOLOGY: No anemia. No bruising. No bleeding.  INTEGUMENTARY: No rashes. No lesions.  MUSCULOSKELETAL: No arthritis. No swelling. No gout.  NEUROLOGIC: No numbness, tingling, or ataxia. No seizure-type activity.  PSYCHIATRIC: No anxiety. No insomnia. No ADD.    Vitals:   Filed Vitals:   12/05/15 1531 12/05/15 2028 12/06/15 0525 12/06/15 0824  BP:  153/66 164/71 163/75  Pulse:  60 51 65  Temp:  97.8 F (36.6 C) 98.2 F (36.8 C) 98.4 F (36.9 C)  TempSrc:  Oral Oral Oral  Resp:  16 16 16   Height:      Weight:      SpO2: 97% 98% 94% 98%    Wt Readings from Last 3 Encounters:  12/04/15 79.6 kg (175 lb 7.8 oz)     Intake/Output Summary (Last 24 hours) at 12/06/15 [id number] Last data filed at 12/06/15 0800  Gross per 24 hour  Intake    480 ml   Output   1725 ml  Net  -1245 ml    Physical Exam:   GENERAL: Pleasant-appearing in no apparent distress.  HEAD, EYES, EARS, NOSE AND THROAT: Atraumatic, normocephalic. Extraocular muscles are intact. Pupils equal and reactive to light. Sclerae anicteric. No conjunctival injection. No oro-pharyngeal erythema.  NECK: Supple. There is no jugular venous distention. No bruits, no lymphadenopathy, no thyromegaly.  HEART: Regular rate and rhythm,. No murmurs, no rubs, no clicks.  LUNGS: Improved Bilateral bilateral wheezing throughout both lungs, no rhonchi or  crackles ABDOMEN: Soft, flat, nontender, nondistended. Has good bowel sounds. No hepatosplenomegaly appreciated.  EXTREMITIES: No evidence of any cyanosis, clubbing, or peripheral edema.  +2 pedal and radial pulses bilaterally.  NEUROLOGIC: The patient is alert, awake, and oriented x3 with no focal motor or sensory deficits appreciated bilaterally.  SKIN: Moist and warm with no rashes appreciated.  Psych: Not anxious, depressed LN: No inguinal LN enlargement    Antibiotics   Anti-infectives    Start     Dose/Rate Route Frequency Ordered Stop   12/06/15 1000  Levofloxacin (LEVAQUIN) IVPB 250 mg     250 mg 50 mL/hr over 60 Minutes Intravenous Every 24 hours 12/05/15 1010     12/05/15 1015  levofloxacin (LEVAQUIN) IVPB 500 mg     500 mg 100 mL/hr over 60 Minutes Intravenous  Once 12/05/15 1004 12/05/15 1307      Medications   Scheduled Meds: . aspirin EC  81 mg Oral Daily  . budesonide (PULMICORT) nebulizer solution  0.25 mg Nebulization BID  . clopidogrel  75 mg Oral Daily  . furosemide  20 mg Intravenous BID  . gabapentin  100 mg Oral BID  . guaiFENesin  600 mg Oral BID  . heparin  5,000 Units Subcutaneous 3 times per day  . insulin aspart  0-15 Units Subcutaneous TID WC  . insulin aspart  0-5 Units Subcutaneous QHS  . insulin glargine  26 Units Subcutaneous QHS  . ipratropium-albuterol  3 mL Nebulization Q6H  .  isosorbide mononitrate  60 mg Oral Daily  . levofloxacin (LEVAQUIN) IV  250 mg Intravenous Q24H  . lisinopril  40 mg Oral Daily  . methylPREDNISolone (SOLU-MEDROL) injection  40 mg Intravenous Q12H  . metoprolol  50 mg Oral BID  . multivitamin with minerals  1 tablet Oral Daily  . nicotine  21 mg Transdermal Daily  . pantoprazole  40 mg Oral QAC breakfast  . simvastatin  20 mg Oral QHS   Continuous Infusions:  PRN Meds:.acetaminophen **OR** acetaminophen, diphenhydrAMINE, labetalol, morphine injection, ondansetron **OR** ondansetron (ZOFRAN) IV, oxyCODONE, sodium chloride flush   Data Review:   Micro Results Recent Results (from the past 240 hour(s))  MRSA PCR Screening     Status: None   Collection Time: 12/03/15 11:43 PM  Result Value Ref Range Status   MRSA by PCR NEGATIVE NEGATIVE Final    Comment:        The GeneXpert MRSA Assay (FDA approved for NASAL specimens only), is one component of a comprehensive MRSA colonization surveillance program. It is not intended to diagnose MRSA infection nor to guide or monitor treatment for MRSA infections.     Radiology Reports Dg Chest 2 View  12/05/2015  CLINICAL DATA:  Shortness of breath, history of COPD, coronary artery disease, current smoker. Acute and chronic respiratory failure. EXAM: CHEST  2 VIEW COMPARISON:  Portable chest x-ray of December 03, 2015 FINDINGS: The lungs remain hyperinflated. The pulmonary interstitial markings while increased are less conspicuous than on the earlier study. A small left pleural effusion remains. The cardiac silhouette is top-normal in size. The pulmonary vascularity is not engorged. There are 7 intact sternal wires. The bony thorax exhibits no acute abnormality. IMPRESSION: COPD with improving pulmonary interstitial edema. Stable mild cardiomegaly and central pulmonary vascular congestion. No discrete pneumonia is observed. Persistent small left pleural effusion. Electronically Signed   By: David   Martinique M.D.   On: 12/05/2015 07:50   Dg Chest Port 1 View  12/03/2015  CLINICAL DATA:  Shortness of breath, diaphoresis, high blood pressure EXAM: PORTABLE CHEST 1 VIEW COMPARISON:  Portable exam 1659 hours compared to 04/28/2014 FINDINGS: Normal heart size post CABG. Atherosclerotic calcification aorta. Pulmonary vascular congestion. Interstitial infiltrates bilaterally slightly greater at RIGHT base. Tiny LEFT pleural effusion. No pneumothorax. Bones unremarkable. IMPRESSION: Post CABG with slight pulmonary vascular congestion and BILATERAL interstitial infiltrates question mild pulmonary edema and CHF though infection not completely excluded. Electronically Signed   By: Lavonia Dana M.D.   On: 12/03/2015 17:17     CBC  Recent Labs Lab 12/03/15 1656 12/04/15 0338 12/05/15 0543 12/06/15 0422  WBC 26.4* 10.5 15.9* 14.5*  HGB 17.9 15.3 14.2 14.6  HCT 56.6* 46.2 42.9 44.2  PLT 314 230 252 253  MCV 90.3 89.4 87.8 88.2  MCH 28.6 29.6 29.1 29.1  MCHC 31.7* 33.1 33.1 33.0  RDW 15.8* 15.6* 15.3* 15.4*  LYMPHSABS 5.4*  --   --   --   MONOABS 1.8*  --   --   --   EOSABS 0.1  --   --   --   BASOSABS 0.2*  --   --   --     Chemistries   Recent Labs Lab 12/03/15 1656 12/04/15 0338 12/05/15 0543 12/06/15 0422  NA 137 134* 137 136  K 3.4* 4.3 4.3 5.0  CL 102 100* 102 97*  CO2 23 26 30 31   GLUCOSE 271* 332* 305* 229*  BUN 12 18 32* 37*  CREATININE 1.43* 1.55* 1.50* 1.50*  CALCIUM 9.4 8.9 8.9 9.0  MG  --  2.1  --   --    ------------------------------------------------------------------------------------------------------------------ estimated creatinine clearance is 45.4 mL/min (by C-G formula based on Cr of 1.5). ------------------------------------------------------------------------------------------------------------------  Recent Labs  12/04/15 0338  HGBA1C 9.3*    ------------------------------------------------------------------------------------------------------------------ No results for input(s): CHOL, HDL, LDLCALC, TRIG, CHOLHDL, LDLDIRECT in the last 72 hours. ------------------------------------------------------------------------------------------------------------------ No results for input(s): TSH, T4TOTAL, T3FREE, THYROIDAB in the last 72 hours.  Invalid input(s): FREET3 ------------------------------------------------------------------------------------------------------------------ No results for input(s): VITAMINB12, FOLATE, FERRITIN, TIBC, IRON, RETICCTPCT in the last 72 hours.  Coagulation profile No results for input(s): INR, PROTIME in the last 168 hours.  No results for input(s): DDIMER in the last 72 hours.  Cardiac Enzymes  Recent Labs Lab 12/03/15 1656 12/04/15 0338  TROPONINI 0.06* 0.07*   ------------------------------------------------------------------------------------------------------------------ Invalid input(s): POCBNP    Assessment & Plan   70 year old Caucasian gentleman history of coronary artery disease status post bypass, COPD and non-oxygen requiring presenting with respiratory distress.  1. Acute on chronic respiratory failure with hypoxia secondary to flash pulmonary edema And possible COPD exasperation as well as possible pneumonia  Change Lasix orally, continue Mucinex and antibitoics  budesonide nebs 2. Elevated troponin: Likely due to demand ischemia aspirin 3. Type 2 diabetes insulin requiring:  Blood sugars in the 300s blood sugar improved with change in insulin dose  4. Coronary artery disease status post bypass continue with aspirin, statin, Plavix, metoprolol 5. GERD without esophagitis: PPI therapy 6. Venous thromboembolism prophylactic: Heparin subcutaneous     Code Status Orders        Start     Ordered   12/03/15 1730  Full code   Continuous     12/03/15 1730    Code  Status History    Date Active Date Inactive Code Status Order ID Comments User Context   This patient has a current code status but no historical code status.      Ambulate today possible discharge tomorrow  Consults  cardiology DVT Prophylaxis  heparin  Lab Results  Component Value Date   PLT 253 12/06/2015     Time Spent in minutes   25 min    Dustin Flock M.D on 12/06/2015 at 9:04 AM  Between 7am to 6pm - Pager - [phone]  After 6pm go to [email or web address] - password [email or web address] [place] [facility]   Office  [phone]

## 2015-12-06 NOTE — Care Management (Signed)
spoke with primary nurse regarding need to assess for need of home 02 and to ambulate patient.  Changed to oral lasix today and would anticipate discharge within 24 hours.  Patient may benefit from home health nurse to monitor cp status and follow elevated blood sugars

## 2015-12-06 NOTE — Progress Notes (Signed)
Patient ambulated well, did require use of a front wheeled walker. Ambulated 450 ft. [name]

## 2015-12-06 NOTE — Progress Notes (Signed)
SATURATION QUALIFICATIONS: (This note is used to comply with regulatory documentation for home oxygen)  Patient Saturations on Room Air at Rest = 96%  Patient Saturations on Room Air while Ambulating = 90-93%  Patient Saturations on NA Liters of oxygen while Ambulating = NA  [employer]

## 2015-12-06 NOTE — Progress Notes (Signed)
Inpatient Diabetes Program Recommendations  AACE/ADA: New Consensus Statement on Inpatient Glycemic Control (2015)  Target Ranges:  Prepandial:   less than 140 mg/dL      Peak postprandial:   less than 180 mg/dL (1-2 hours)      Critically ill patients:  140 - 180 mg/dL   Review of Glycemic Control  Results for [name], [name] (MRN [id number]) as of 12/06/2015 08:35  Ref. Range 12/05/2015 07:40 12/05/2015 11:21 12/05/2015 16:20 12/05/2015 20:29 12/06/2015 07:37  Glucose-Capillary Latest Ref Range: 65-99 mg/dL 259 (H) 424 (H) 191 (H) 254 (H) 233 (H)     Diabetes history: DM2 Outpatient Diabetes medications: Lantus 40 units QHS, Metformin 1000 mg BID Current orders for Inpatient glycemic control: Lantus 26 units QHS, Novolog 0-15 units TID with meals, Novolog 0-5 units QHS  Inpatient Diabetes Program Recommendations:  Please consider increasing Lantus to 35 units QHS.  If steroids are continued as ordered (Solumedrol 40 mg Q12H), please consider ordering Novolog  5 units TID with meals for meal coverage (in addition to Novolog correction scale).(note- post prandial blood sugars are not coming down with current correction insulin)  Gentry Fitz, RN, BA, MHA, CDE Diabetes Coordinator Inpatient Diabetes Program  [phone] (Team Pager) [phone] ([place]) 12/06/2015 7:15 AM   A1C: A1C is 9.3% on 12/04/15 indicating an average glucose of 220 mg/dl over the past 2-3 months. Patient needs to follow up with PCP regarding glycemic control.

## 2015-12-06 NOTE — Progress Notes (Signed)
Spoke with Dr. Posey Pronto about patient's elevated cbg at 428. Ordered to give 20 units one time along with 10 units sliding scale.  Wilnette Kales

## 2015-12-07 DIAGNOSIS — R7989 Other specified abnormal findings of blood chemistry: Secondary | ICD-10-CM | POA: Diagnosis not present

## 2015-12-07 DIAGNOSIS — E119 Type 2 diabetes mellitus without complications: Secondary | ICD-10-CM | POA: Diagnosis not present

## 2015-12-07 DIAGNOSIS — J9621 Acute and chronic respiratory failure with hypoxia: Secondary | ICD-10-CM | POA: Diagnosis not present

## 2015-12-07 DIAGNOSIS — J810 Acute pulmonary edema: Secondary | ICD-10-CM | POA: Diagnosis not present

## 2015-12-07 LAB — GLUCOSE, CAPILLARY
GLUCOSE-CAPILLARY: 248 mg/dL — AB (ref 65–99)
Glucose-Capillary: 137 mg/dL — ABNORMAL HIGH (ref 65–99)

## 2015-12-07 MED ORDER — CEFUROXIME AXETIL 500 MG PO TABS: 500.0000 mg | ORAL_TABLET | Freq: Two times a day (BID) | ORAL | Status: AC

## 2015-12-07 MED ORDER — PREDNISONE 10 MG (21) PO TBPK: ORAL_TABLET | ORAL | Status: DC

## 2015-12-07 MED ORDER — FLUTICASONE-SALMETEROL 250-50 MCG/DOSE IN AEPB: 1.0000 | INHALATION_SPRAY | Freq: Two times a day (BID) | RESPIRATORY_TRACT | Status: DC

## 2015-12-07 MED ORDER — AZITHROMYCIN 250 MG PO TABS: ORAL_TABLET | ORAL | Status: AC

## 2015-12-07 NOTE — Care Management (Signed)
Primary nurse informed CM that patient's ambulation is greatly helped with use of a walker.  Patient informed primary nurse and CM that he did not have a walker and when obtained order for one, he and his wife remembered they could use one of a deceased family member.Patient may have benefited from home health nurse but it was not something that he wanted.  He does not have a history of frequent presentations

## 2015-12-07 NOTE — Discharge Instructions (Signed)
°  DIET:  Cardiac diet, carobhydrate diet  DISCHARGE CONDITION:  Stable  ACTIVITY:  Activity as tolerated  OXYGEN:  Home Oxygen: No.   Oxygen Delivery: room air  DISCHARGE LOCATION:  home    ADDITIONAL DISCHARGE INSTRUCTION:   If you experience worsening of your admission symptoms, develop shortness of breath, life threatening emergency, suicidal or homicidal thoughts you must seek medical attention immediately by calling 911 or calling your MD immediately  if symptoms less severe.  You Must read complete instructions/literature along with all the possible adverse reactions/side effects for all the Medicines you take and that have been prescribed to you. Take any new Medicines after you have completely understood and accpet all the possible adverse reactions/side effects.   Please note  You were cared for by a hospitalist during your hospital stay. If you have any questions about your discharge medications or the care you received while you were in the hospital after you are discharged, you can call the unit and asked to speak with the hospitalist on call if the hospitalist that took care of you is not available. Once you are discharged, your primary care physician will handle any further medical issues. Please note that NO REFILLS for any discharge medications will be authorized once you are discharged, as it is imperative that you return to your primary care physician (or establish a relationship with a primary care physician if you do not have one) for your aftercare needs so that they can reassess your need for medications and monitor your lab values.

## 2015-12-07 NOTE — Progress Notes (Signed)
Initial appointment made at the [facility] on December 12, 2015 at 12:30pm. Thank you.

## 2015-12-07 NOTE — Care Management Important Message (Signed)
Important Message  Patient Details  Name: [name] MRN: [id number] Date of Birth: [birth year]   Medicare Important Message Given:  Yes    Juliann Pulse A Shakura Cowing 12/07/2015, 10:07 AM

## 2015-12-07 NOTE — Discharge Summary (Signed)
[name], 70 y.o., DOB 1946-02-01, MRN [id number]. Admission date: 12/03/2015 Discharge Date 12/07/2015 Primary MD No primary care provider on file. Admitting Physician Dustin Flock, MD  Admission Diagnosis  Shortness of breath [R06.02] Hypertensive urgency [I16.0] Acute on chronic congestive heart failure, unspecified congestive heart failure type ([place]) [I50.9]  Discharge Diagnosis   Active Problems:   Acute on chronic respiratory failure with hypoxia (HCC) Acute systolic CHF Suspected community-acquired pneumonia Acute on chronic COPD exasperation Elevated troponin due to demand ischemia DMII Coronary arteries disease GERD      Hospital Course  patient is a 70 year old white male with history of COPD coronary artery disease who presented with acute onset of shortness of breath. Patient had to be placed on BiPAP. He was also noticed to have markedly hypertensive with flash pulmonary edema. Patient initially was placed on nitroglycerin drip as well as Lasix given. His symptoms improved significantly by day 2. He was also having cough and congestion and wheezing so he was also treated for COPD exasperation. Patient's symptoms started to improve with these therapies. Patient's now on room air and his breathing is back to baseline.            Consults  cardiology  Significant Tests:  See full reports for all details      Dg Chest 2 View  12/05/2015  CLINICAL DATA:  Shortness of breath, history of COPD, coronary artery disease, current smoker. Acute and chronic respiratory failure. EXAM: CHEST  2 VIEW COMPARISON:  Portable chest x-ray of December 03, 2015 FINDINGS: The lungs remain hyperinflated. The pulmonary interstitial markings while increased are less conspicuous than on the earlier study. A small left pleural effusion remains. The cardiac silhouette is top-normal in size. The pulmonary vascularity is not engorged. There are 7 intact sternal wires. The bony thorax exhibits no  acute abnormality. IMPRESSION: COPD with improving pulmonary interstitial edema. Stable mild cardiomegaly and central pulmonary vascular congestion. No discrete pneumonia is observed. Persistent small left pleural effusion. Electronically Signed   By: David  Martinique M.D.   On: 12/05/2015 07:50   Dg Chest Port 1 View  12/03/2015  CLINICAL DATA:  Shortness of breath, diaphoresis, high blood pressure EXAM: PORTABLE CHEST 1 VIEW COMPARISON:  Portable exam 1659 hours compared to 04/28/2014 FINDINGS: Normal heart size post CABG. Atherosclerotic calcification aorta. Pulmonary vascular congestion. Interstitial infiltrates bilaterally slightly greater at RIGHT base. Tiny LEFT pleural effusion. No pneumothorax. Bones unremarkable. IMPRESSION: Post CABG with slight pulmonary vascular congestion and BILATERAL interstitial infiltrates question mild pulmonary edema and CHF though infection not completely excluded. Electronically Signed   By: Lavonia Dana M.D.   On: 12/03/2015 17:17       Today   Subjective:   [name]  Feels much sob resolved, cough resolved  Objective:   Blood pressure 142/56, pulse 71, temperature 98.3 F (36.8 C), temperature source Oral, resp. rate 18, height 5\' 6"  (1.676 m), weight 79.6 kg (175 lb 7.8 oz), SpO2 95 %.  .  Intake/Output Summary (Last 24 hours) at 12/07/15 1458 Last data filed at 12/07/15 1130  Gross per 24 hour  Intake    720 ml  Output   2225 ml  Net  -1505 ml    Exam VITAL SIGNS: Blood pressure 142/56, pulse 71, temperature 98.3 F (36.8 C), temperature source Oral, resp. rate 18, height 5\' 6"  (1.676 m), weight 79.6 kg (175 lb 7.8 oz), SpO2 95 %.  GENERAL:  70 y.o.-year-old patient lying in the bed with no acute  distress.  EYES: Pupils equal, round, reactive to light and accommodation. No scleral icterus. Extraocular muscles intact.  HEENT: Head atraumatic, normocephalic. Oropharynx and nasopharynx clear.  NECK:  Supple, no jugular venous distention. No  thyroid enlargement, no tenderness.  LUNGS: Normal breath sounds bilaterally, no wheezing, rales,rhonchi or crepitation. No use of accessory muscles of respiration.  CARDIOVASCULAR: S1, S2 normal. No murmurs, rubs, or gallops.  ABDOMEN: Soft, nontender, nondistended. Bowel sounds present. No organomegaly or mass.  EXTREMITIES: No pedal edema, cyanosis, or clubbing.  NEUROLOGIC: Cranial nerves II through XII are intact. Muscle strength 5/5 in all extremities. Sensation intact. Gait not checked.  PSYCHIATRIC: The patient is alert and oriented x 3.  SKIN: No obvious rash, lesion, or ulcer.   Data Review     CBC w Diff: Lab Results  Component Value Date   WBC 14.5* 12/06/2015   WBC 22.0* 04/30/2014   HGB 14.6 12/06/2015   HGB 12.7* 04/30/2014   HCT 44.2 12/06/2015   HCT 37.7* 04/30/2014   PLT 253 12/06/2015   PLT 210 04/30/2014   LYMPHOPCT 20 12/03/2015   LYMPHOPCT 7.1 04/30/2014   MONOPCT 7 12/03/2015   MONOPCT 5.8 04/30/2014   EOSPCT 0 12/03/2015   EOSPCT 0.2 04/30/2014   BASOPCT 1 12/03/2015   BASOPCT 0.5 04/30/2014   CMP: Lab Results  Component Value Date   NA 136 12/06/2015   NA 136 04/30/2014   K 5.0 12/06/2015   K 3.8 04/30/2014   CL 97* 12/06/2015   CL 105 04/30/2014   CO2 31 12/06/2015   CO2 25 04/30/2014   BUN 37* 12/06/2015   BUN 14 04/30/2014   CREATININE 1.50* 12/06/2015   CREATININE 1.36* 04/30/2014   PROT 7.7 04/28/2014   ALBUMIN 4.0 04/28/2014   BILITOT 1.0 04/28/2014   ALKPHOS 92 04/28/2014   AST 10* 04/28/2014   ALT 17 04/28/2014  .  Micro Results Recent Results (from the past 240 hour(s))  MRSA PCR Screening     Status: None   Collection Time: 12/03/15 11:43 PM  Result Value Ref Range Status   MRSA by PCR NEGATIVE NEGATIVE Final    Comment:        The GeneXpert MRSA Assay (FDA approved for NASAL specimens only), is one component of a comprehensive MRSA colonization surveillance program. It is not intended to diagnose MRSA infection nor  to guide or monitor treatment for MRSA infections.         Code Status Orders        Start     Ordered   12/03/15 1730  Full code   Continuous     12/03/15 1730    Code Status History    Date Active Date Inactive Code Status Order ID Comments User Context   This patient has a current code status but no historical code status.          Follow-up Information    Follow up with pcp In 7 days.      Follow up with Alisa Graff, FNP. Go on 12/12/2015.   Specialty:  Family Medicine   Why:  at 12:30pm. , to the [place] information:   [facility] [address] [place] [id number] [phone]       Discharge Medications     Medication List    STOP taking these medications        potassium chloride 10 MEQ tablet  Commonly known as:  K-DUR,KLOR-CON  TAKE these medications        albuterol 108 (90 Base) MCG/ACT inhaler  Commonly known as:  PROVENTIL HFA;VENTOLIN HFA  Inhale 2 puffs into the lungs every 6 (six) hours as needed for wheezing or shortness of breath.     aspirin EC 81 MG tablet  Take 81 mg by mouth daily.     azithromycin 250 MG tablet  Commonly known as:  ZITHROMAX  One tablet daily x 3 days     cefUROXime 500 MG tablet  Commonly known as:  CEFTIN  Take 1 tablet (500 mg total) by mouth 2 (two) times daily with a meal.     clopidogrel 75 MG tablet  Commonly known as:  PLAVIX  Take 75 mg by mouth daily.     Fluticasone-Salmeterol 250-50 MCG/DOSE Aepb  Commonly known as:  ADVAIR DISKUS  Inhale 1 puff into the lungs 2 (two) times daily.     furosemide 40 MG tablet  Commonly known as:  LASIX  Take 40 mg by mouth 2 (two) times daily.     gabapentin 100 MG capsule  Commonly known as:  NEURONTIN  Take 100 mg by mouth 2 (two) times daily.     insulin glargine 100 UNIT/ML injection  Commonly known as:  LANTUS  Inject 40 Units into the skin at bedtime.     isosorbide mononitrate 30 MG 24 hr tablet   Commonly known as:  IMDUR  Take 30 mg by mouth daily.     lisinopril 40 MG tablet  Commonly known as:  PRINIVIL,ZESTRIL  Take 40 mg by mouth daily.     metFORMIN 1000 MG tablet  Commonly known as:  GLUCOPHAGE  Take 1,000 mg by mouth 2 (two) times daily with a meal.     metoprolol 50 MG tablet  Commonly known as:  LOPRESSOR  Take 50 mg by mouth 2 (two) times daily.     multivitamin with minerals Tabs tablet  Take 1 tablet by mouth daily.     multivitamin-lutein Caps capsule  Take 1 capsule by mouth daily.     omeprazole 20 MG capsule  Commonly known as:  PRILOSEC  Take 20 mg by mouth daily.     predniSONE 10 MG (21) Tbpk tablet  Commonly known as:  STERAPRED UNI-PAK 21 TAB  Start 50mg  taper by 10mg  each day until complete     simvastatin 20 MG tablet  Commonly known as:  ZOCOR  Take 20 mg by mouth at bedtime.           Total Time in preparing paper work, data evaluation and todays exam - 35 minutes  Dustin Flock M.D on 12/07/2015 at 2:58 PM  [facility]   Office  [phone]

## 2015-12-07 NOTE — Progress Notes (Signed)
Patient d/c'd hiome. Education provided, no questions at this time. Patient to be picked up by wife. Telemetry removed. Wilnette Kales

## 2015-12-11 DIAGNOSIS — J441 Chronic obstructive pulmonary disease with (acute) exacerbation: Secondary | ICD-10-CM | POA: Diagnosis not present

## 2015-12-12 ENCOUNTER — Encounter: Payer: Self-pay | Admitting: Family

## 2015-12-12 ENCOUNTER — Ambulatory Visit: Payer: PPO | Admitting: Family

## 2015-12-12 ENCOUNTER — Ambulatory Visit: Payer: PPO | Attending: Family | Admitting: Family

## 2015-12-12 VITALS — BP 169/69 | HR 62 | Resp 18 | Ht 66.0 in | Wt 170.0 lb

## 2015-12-12 DIAGNOSIS — I5022 Chronic systolic (congestive) heart failure: Secondary | ICD-10-CM | POA: Diagnosis not present

## 2015-12-12 DIAGNOSIS — Z8673 Personal history of transient ischemic attack (TIA), and cerebral infarction without residual deficits: Secondary | ICD-10-CM | POA: Diagnosis not present

## 2015-12-12 DIAGNOSIS — Z79899 Other long term (current) drug therapy: Secondary | ICD-10-CM | POA: Diagnosis not present

## 2015-12-12 DIAGNOSIS — Z7982 Long term (current) use of aspirin: Secondary | ICD-10-CM | POA: Insufficient documentation

## 2015-12-12 DIAGNOSIS — J449 Chronic obstructive pulmonary disease, unspecified: Secondary | ICD-10-CM

## 2015-12-12 DIAGNOSIS — I10 Essential (primary) hypertension: Secondary | ICD-10-CM

## 2015-12-12 DIAGNOSIS — Z720 Tobacco use: Secondary | ICD-10-CM | POA: Insufficient documentation

## 2015-12-12 DIAGNOSIS — E119 Type 2 diabetes mellitus without complications: Secondary | ICD-10-CM | POA: Insufficient documentation

## 2015-12-12 DIAGNOSIS — R001 Bradycardia, unspecified: Secondary | ICD-10-CM | POA: Insufficient documentation

## 2015-12-12 DIAGNOSIS — I110 Hypertensive heart disease with heart failure: Secondary | ICD-10-CM | POA: Insufficient documentation

## 2015-12-12 DIAGNOSIS — Z794 Long term (current) use of insulin: Secondary | ICD-10-CM | POA: Diagnosis not present

## 2015-12-12 DIAGNOSIS — Z87891 Personal history of nicotine dependence: Secondary | ICD-10-CM | POA: Insufficient documentation

## 2015-12-12 DIAGNOSIS — Z7984 Long term (current) use of oral hypoglycemic drugs: Secondary | ICD-10-CM | POA: Insufficient documentation

## 2015-12-12 DIAGNOSIS — J4489 Other specified chronic obstructive pulmonary disease: Secondary | ICD-10-CM

## 2015-12-12 NOTE — Patient Instructions (Signed)
Begin weighing daily and call for an overnight weight gain of > 2 pounds or a weekly weight gain of >5 pounds. 

## 2015-12-12 NOTE — Progress Notes (Signed)
Subjective:    Patient ID: [name], male    DOB: [birth year], 70 y.o.   MRN: [id number]  Congestive Heart Failure Presents for initial visit. The disease course has been improving. Pertinent negatives include no abdominal pain, chest pain, edema, fatigue, orthopnea, palpitations or shortness of breath. The symptoms have been improving. Past treatments include beta blockers, ACE inhibitors and salt and fluid restriction. The treatment provided moderate relief. Compliance with prior treatments has been good. His past medical history is significant for CAD, chronic lung disease, DM and HTN. He has one 1st degree relative with heart disease. Compliance with total regimen is 76-100%.  Hypertension This is a chronic problem. The current episode started more than 1 year ago. The problem has been waxing and waning since onset. Pertinent negatives include no chest pain, headaches, neck pain, palpitations, peripheral edema or shortness of breath. There are no associated agents to hypertension. Risk factors for coronary artery disease include diabetes mellitus, family history and male gender. Past treatments include ACE inhibitors, beta blockers, diuretics and lifestyle changes. The current treatment provides moderate improvement. Compliance problems include exercise.  Hypertensive end-organ damage includes CAD/MI and heart failure.    Past Medical History  Diagnosis Date  . COPD (chronic obstructive pulmonary disease) ([place])   . Coronary artery disease   . Diabetes mellitus without complication ([place])   . Hypertension     Past Surgical History  Procedure Laterality Date  . Coronary artery bypass graft  2006    Family History  Problem Relation Age of Onset  . Hypertension Other   . Lung cancer Mother   . Heart attack Father     Social History  Substance Use Topics  . Smoking status: Former Smoker -- 1.00 packs/day    Types: Cigarettes    Quit date: 12/05/2015  . Smokeless tobacco: Never  Used  . Alcohol Use: No    No Known Allergies  Prior to Admission medications   Medication Sig Start Date End Date Taking? Authorizing Provider  albuterol (PROVENTIL HFA;VENTOLIN HFA) 108 (90 Base) MCG/ACT inhaler Inhale 2 puffs into the lungs every 6 (six) hours as needed for wheezing or shortness of breath.   Yes Historical Provider, MD  aspirin EC 81 MG tablet Take 81 mg by mouth daily.   Yes Historical Provider, MD  clopidogrel (PLAVIX) 75 MG tablet Take 75 mg by mouth daily.   Yes Historical Provider, MD  Fluticasone-Salmeterol (ADVAIR DISKUS) 250-50 MCG/DOSE AEPB Inhale 1 puff into the lungs 2 (two) times daily. 12/07/15  Yes Dustin Flock, MD  furosemide (LASIX) 40 MG tablet Take 40 mg by mouth 2 (two) times daily.   Yes Historical Provider, MD  gabapentin (NEURONTIN) 100 MG capsule Take 100 mg by mouth 2 (two) times daily.   Yes Historical Provider, MD  insulin glargine (LANTUS) 100 UNIT/ML injection Inject 50 Units into the skin at bedtime.    Yes Historical Provider, MD  isosorbide mononitrate (IMDUR) 30 MG 24 hr tablet Take 30 mg by mouth daily.   Yes Historical Provider, MD  lisinopril (PRINIVIL,ZESTRIL) 40 MG tablet Take 40 mg by mouth daily.   Yes Historical Provider, MD  metFORMIN (GLUCOPHAGE) 1000 MG tablet Take 1,000 mg by mouth 2 (two) times daily with a meal.   Yes Historical Provider, MD  metoprolol (LOPRESSOR) 50 MG tablet Take 50 mg by mouth 2 (two) times daily.   Yes Historical Provider, MD  Multiple Vitamin (MULTIVITAMIN WITH MINERALS) TABS tablet Take 1 tablet by mouth  daily.   Yes Historical Provider, MD  multivitamin-lutein (OCUVITE-LUTEIN) CAPS capsule Take 1 capsule by mouth daily.   Yes Historical Provider, MD  omeprazole (PRILOSEC) 20 MG capsule Take 20 mg by mouth daily.   Yes Historical Provider, MD  predniSONE (STERAPRED UNI-PAK 21 TAB) 10 MG (21) TBPK tablet Start 50mg  taper by 10mg  each day until complete 12/07/15  Yes Dustin Flock, MD  simvastatin (ZOCOR) 20  MG tablet Take 20 mg by mouth at bedtime.   Yes Historical Provider, MD     Review of Systems  Constitutional: Negative for appetite change and fatigue.  HENT: Negative for congestion, postnasal drip and sore throat.   Eyes: Positive for visual disturbance (in right eye due to previous stroke). Negative for pain.  Respiratory: Negative for cough, chest tightness and shortness of breath.   Cardiovascular: Negative for chest pain, palpitations and leg swelling.  Gastrointestinal: Negative for abdominal pain and abdominal distention.  Endocrine: Negative.   Genitourinary: Negative.   Musculoskeletal: Negative for back pain and neck pain.  Skin: Negative.   Allergic/Immunologic: Negative.   Neurological: Positive for light-headedness (at times). Negative for dizziness and headaches.  Hematological: Negative for adenopathy. Bruises/bleeds easily.  Psychiatric/Behavioral: Negative for sleep disturbance (sleeping on 1 pillow) and dysphoric mood. The patient is not nervous/anxious.        Objective:   Physical Exam  Constitutional: He is oriented to person, place, and time. He appears well-developed and well-nourished.  HENT:  Head: Normocephalic and atraumatic.  Eyes: Conjunctivae are normal. Pupils are equal, round, and reactive to light.  Neck: Normal range of motion. Neck supple.  Cardiovascular: Normal rate and regular rhythm.   Pulmonary/Chest: Effort normal. He has no wheezes. He has no rales.  Abdominal: Soft. He exhibits no distension. There is no tenderness.  Musculoskeletal: He exhibits no edema or tenderness.  Neurological: He is alert and oriented to person, place, and time.  Skin: Skin is warm and dry.  Psychiatric: He has a normal mood and affect. His behavior is normal. Thought content normal.  Nursing note and vitals reviewed.   BP 169/69 mmHg  Pulse 62  Resp 18  Ht 5\' 6"  (1.676 m)  Wt 170 lb (77.111 kg)  BMI 27.45 kg/m2  SpO2 96%       Assessment & Plan:   1: Chronic heart failure with reduced ejection fraction- Patient presents without any fatigue, shortness of breath or swelling in his legs nor abdomen. He came into the office using a walker due to balance issues related to a previous stroke. He hasn't been weighing daily but does have some scales. Instructed him to begin weighing on a daily basis in the morning after using the bathroom and to call for an overnight weight gain of >2 pounds or a weekly weight gain of >5 pounds. He is not adding salt to his food except for rare occasions and he and his wife have been looking at food labels. Discussed the importance of not adding any salt to his food and to follow a 2000mg  sodium diet. Written information was also given to him about a low sodium diet. Discussed possibly changing his lisinopril to entresto in the future. May not be able to titrate up his metoprolol due to his bradycardia. [facility] went in and reviewed medications with the patient and his wife. Advance directives were given to patient to take home and review.  2: HTN- Blood pressure elevated today and patient says that it tends to run a  little high. Will continue to monitor. Has recently seen his PCP. 3: COPD- Continues to use his inhalers. Feels like this is stable at this time. 4: Diabetes- He is unsure of what his glucose is because he's been staying at his mother-in-law's house and forgot to take his glucometer with him. He does know that the last time he checked it, it was high because he's been taking prednisone. Encouraged him to get his meter as soon as possible to keep a check on his glucose and to follow up with his PCP regarding this. 5: Tobacco use- Patient says that he stopped smoking completely 1 week ago. Congratulated him on that and encouraged him to continue cessation. Discussed this for 3 minutes with him.  Return here in 1 month or sooner for any questions/problems before then.

## 2016-01-01 ENCOUNTER — Other Ambulatory Visit: Payer: Self-pay | Admitting: *Deleted

## 2016-01-01 NOTE — Patient Outreach (Signed)
[facility]) Care Management  01/01/2016  [name] 09/15/1946 [id number]   Subjective: Telephone call to patient's home number, no answer, left HIPAA compliant voice mail message, and requested call back.  Objective: Per Epic case review: Patient has a history of COPD, diabetes, chronic systolic congestive heart failure, CAD and hypertension.   Patient's last appointment with Darylene Price FNP at the [facility] was on 12/12/15.   Patient hospitalized  12/03/15 - [id number] for acute systolic congestive heart failure,acute on chronic respiratory failure with hypoxia , suspected community-acquired pneumonia, acute on chronic COPD exacerbation, elevated troponin due to demand ischemia, and GERD.    Assessment:  Received [facility] referral on 12/19/15.  Referral source: Laurene Footman or [facility].   Referral reason: Nursing/ teaching support, diagnosis congestive heart failure and COPD.      Requesting: Community RNCM for face to face visit.     Plan: RNCM will call patient within 3 business days for 2nd attempt outreach, telephone screen completion.   Tynisa Vohs H. Annia Friendly, BSN, [place] Management [facility] Telephonic CM Phone: [phone] Fax: [phone]

## 2016-01-02 ENCOUNTER — Other Ambulatory Visit: Payer: Self-pay | Admitting: *Deleted

## 2016-01-02 ENCOUNTER — Encounter: Payer: Self-pay | Admitting: *Deleted

## 2016-01-02 NOTE — Progress Notes (Signed)
This encounter was created in error - please disregard.

## 2016-01-02 NOTE — Patient Outreach (Addendum)
[facility]) Care Management  01/02/2016  [name] 1945/12/13 [id number]   Subjective: Telephone call to patient's home number, no answer, left HIPAA compliant voice mail message, and requested call back. Telephone call to referral source [name] at [place], no answer, left HIPAA compliant voice mail message, and requested call back. Telephone call from [facility], HIPAA verified, RNCM advised of referral status and [facility] outreach attempts.   [name] states she was not able to reach the patient as well, patient currently closed with [facility] and referred to [facility] based on admitting diagnosis.   Telephone call to patient's home number, no answer, left HIPAA compliant voice mail message, and requested call back.  Objective: Per Epic case review: Patient has a history of COPD, diabetes, chronic systolic congestive heart failure, CAD and hypertension. Patient's last appointment with Barry Price FNP at [facility] was on 12/12/15. Patient hospitalized 12/03/15 - [id number] for acute systolic congestive heart failure,acute on chronic respiratory failure with hypoxia , suspected community-acquired pneumonia, acute on chronic COPD exacerbation, elevated troponin due to demand ischemia, and GERD.   Assessment: Received [facility] referral on 12/19/15. Referral source: [name] or [place]. Referral reason: Nursing/ teaching support, diagnosis congestive heart failure and COPD. Requesting: Community RNCM for face to face visit.  Telephone screen completion, pending patient contact.    Plan: RNCM will proceed with case closure within 10 business days if no return call from patient.    [name] H. Annia Friendly, BSN, [place] Management [facility] Telephonic CM Phone: [phone] Fax: [phone]

## 2016-01-09 ENCOUNTER — Encounter: Payer: Self-pay | Admitting: Family

## 2016-01-09 ENCOUNTER — Ambulatory Visit: Payer: PPO | Attending: Family | Admitting: Family

## 2016-01-09 VITALS — BP 146/67 | HR 77 | Resp 18 | Ht 66.0 in | Wt 170.0 lb

## 2016-01-09 DIAGNOSIS — E119 Type 2 diabetes mellitus without complications: Secondary | ICD-10-CM | POA: Diagnosis not present

## 2016-01-09 DIAGNOSIS — Z7982 Long term (current) use of aspirin: Secondary | ICD-10-CM | POA: Insufficient documentation

## 2016-01-09 DIAGNOSIS — Z8673 Personal history of transient ischemic attack (TIA), and cerebral infarction without residual deficits: Secondary | ICD-10-CM | POA: Diagnosis not present

## 2016-01-09 DIAGNOSIS — Z720 Tobacco use: Secondary | ICD-10-CM

## 2016-01-09 DIAGNOSIS — I2510 Atherosclerotic heart disease of native coronary artery without angina pectoris: Secondary | ICD-10-CM | POA: Diagnosis not present

## 2016-01-09 DIAGNOSIS — I10 Essential (primary) hypertension: Secondary | ICD-10-CM

## 2016-01-09 DIAGNOSIS — Z9889 Other specified postprocedural states: Secondary | ICD-10-CM | POA: Diagnosis not present

## 2016-01-09 DIAGNOSIS — K219 Gastro-esophageal reflux disease without esophagitis: Secondary | ICD-10-CM | POA: Insufficient documentation

## 2016-01-09 DIAGNOSIS — Z79899 Other long term (current) drug therapy: Secondary | ICD-10-CM | POA: Insufficient documentation

## 2016-01-09 DIAGNOSIS — J449 Chronic obstructive pulmonary disease, unspecified: Secondary | ICD-10-CM | POA: Diagnosis not present

## 2016-01-09 DIAGNOSIS — Z7984 Long term (current) use of oral hypoglycemic drugs: Secondary | ICD-10-CM | POA: Insufficient documentation

## 2016-01-09 DIAGNOSIS — Z794 Long term (current) use of insulin: Secondary | ICD-10-CM

## 2016-01-09 DIAGNOSIS — F17210 Nicotine dependence, cigarettes, uncomplicated: Secondary | ICD-10-CM | POA: Diagnosis not present

## 2016-01-09 DIAGNOSIS — I5022 Chronic systolic (congestive) heart failure: Secondary | ICD-10-CM

## 2016-01-09 MED ORDER — SACUBITRIL-VALSARTAN 24-26 MG PO TABS: 1.0000 | ORAL_TABLET | Freq: Two times a day (BID) | ORAL | Status: DC

## 2016-01-09 NOTE — Patient Instructions (Addendum)
Darlene H. Burt Knack RN, BSN, CCM [facility] Care Management [facility] Telephonic CM Phone: [phone]  Please return their call.  Continue weighing daily and call for an overnight weight gain of > 2 pounds or a weekly weight gain of >5 pounds.   Do NOT take anymore Lisinopril. Take the first dose of Entresto 24/26mg  tomorrow (thurs) evening and then on Friday begin taking Entresto twice daily (morning and evening). Will check lab work at your next visit.

## 2016-01-09 NOTE — Progress Notes (Signed)
Subjective:    Patient ID: [name], male    DOB: 28-Nov-1945, 70 y.o.   MRN: [id number]  Congestive Heart Failure Presents for follow-up visit. The disease course has been stable. Associated symptoms include fatigue. Pertinent negatives include no abdominal pain, chest pain, chest pressure, edema, orthopnea, palpitations or shortness of breath. The symptoms have been stable. Past treatments include ACE inhibitors, beta blockers and salt and fluid restriction. The treatment provided moderate relief. Compliance with prior treatments has been good. His past medical history is significant for CAD, chronic lung disease, CVA, DM and HTN. He has one 1st degree relative with heart disease.  Hypertension This is a chronic problem. The current episode started more than 1 year ago. The problem is unchanged. The problem is controlled. Associated symptoms include malaise/fatigue. Pertinent negatives include no chest pain, headaches, neck pain, palpitations, peripheral edema or shortness of breath. There are no associated agents to hypertension. Risk factors for coronary artery disease include male gender, smoking/tobacco exposure, diabetes mellitus and family history. Past treatments include ACE inhibitors, beta blockers, diuretics and lifestyle changes. The current treatment provides moderate improvement. Compliance problems include exercise.  Hypertensive end-organ damage includes heart failure.   Past Medical History  Diagnosis Date  . COPD (chronic obstructive pulmonary disease) ([place])   . Coronary artery disease   . Diabetes mellitus without complication ([place])   . Hypertension   . Stroke ([place])   . CHF (congestive heart failure) ([place])   . GERD (gastroesophageal reflux disease)   . Pneumonia     Past Surgical History  Procedure Laterality Date  . Coronary artery bypass graft  2006    Family History  Problem Relation Age of Onset  . Hypertension Other   . Lung cancer Mother   . Heart attack  Father     Social History  Substance Use Topics  . Smoking status: Current Every Day Smoker -- 0.50 packs/day for 50 years    Types: Cigarettes  . Smokeless tobacco: Never Used  . Alcohol Use: No    No Known Allergies  Prior to Admission medications   Medication Sig Start Date End Date Taking? Authorizing Provider  albuterol (PROVENTIL HFA;VENTOLIN HFA) 108 (90 Base) MCG/ACT inhaler Inhale 2 puffs into the lungs every 6 (six) hours as needed for wheezing or shortness of breath.   Yes Historical Provider, MD  aspirin EC 81 MG tablet Take 81 mg by mouth daily.   Yes Historical Provider, MD  clopidogrel (PLAVIX) 75 MG tablet Take 75 mg by mouth daily.   Yes Historical Provider, MD  Fluticasone-Salmeterol (ADVAIR DISKUS) 250-50 MCG/DOSE AEPB Inhale 1 puff into the lungs 2 (two) times daily. 12/07/15  Yes Dustin Flock, MD  furosemide (LASIX) 40 MG tablet Take 40 mg by mouth 2 (two) times daily.   Yes Historical Provider, MD  gabapentin (NEURONTIN) 100 MG capsule Take 100 mg by mouth as needed.    Yes Historical Provider, MD  insulin glargine (LANTUS) 100 UNIT/ML injection Inject 50 Units into the skin at bedtime.    Yes Historical Provider, MD  isosorbide mononitrate (IMDUR) 30 MG 24 hr tablet Take 30 mg by mouth daily.   Yes Historical Provider, MD  metFORMIN (GLUCOPHAGE) 1000 MG tablet Take 1,000 mg by mouth 2 (two) times daily with a meal.   Yes Historical Provider, MD  metoprolol (LOPRESSOR) 50 MG tablet Take 50 mg by mouth 2 (two) times daily.   Yes Historical Provider, MD  Multiple Vitamin (MULTIVITAMIN WITH MINERALS) TABS  tablet Take 1 tablet by mouth daily.   Yes Historical Provider, MD  multivitamin-lutein (OCUVITE-LUTEIN) CAPS capsule Take 1 capsule by mouth daily.   Yes Historical Provider, MD  omeprazole (PRILOSEC) 20 MG capsule Take 20 mg by mouth daily.   Yes Historical Provider, MD  simvastatin (ZOCOR) 20 MG tablet Take 20 mg by mouth at bedtime.   Yes Historical Provider, MD   sacubitril-valsartan (ENTRESTO) 24-26 MG Take 1 tablet by mouth 2 (two) times daily. 01/09/16   Alisa Graff, FNP      Review of Systems  Constitutional: Positive for malaise/fatigue and fatigue. Negative for appetite change.  HENT: Positive for rhinorrhea. Negative for congestion and sore throat.   Eyes: Negative.   Respiratory: Negative for cough, chest tightness and shortness of breath.   Cardiovascular: Negative for chest pain, palpitations and leg swelling.  Gastrointestinal: Negative for abdominal pain and abdominal distention.  Endocrine: Negative.   Genitourinary: Negative.   Musculoskeletal: Negative for back pain and neck pain.  Skin: Negative.   Allergic/Immunologic: Negative.   Neurological: Negative for dizziness, light-headedness and headaches.  Hematological: Negative for adenopathy. Does not bruise/bleed easily.  Psychiatric/Behavioral: Negative for sleep disturbance (sleeping on 1 pillow) and dysphoric mood. The patient is not nervous/anxious.        Objective:   Physical Exam  Constitutional: He is oriented to person, place, and time. He appears well-developed and well-nourished.  HENT:  Head: Normocephalic and atraumatic.  Eyes: Conjunctivae are normal. Pupils are equal, round, and reactive to light.  Neck: Normal range of motion. Neck supple.  Cardiovascular: Normal rate and regular rhythm.   Pulmonary/Chest: Effort normal. He has no wheezes. He has no rales.  Abdominal: Soft. He exhibits no distension. There is no tenderness.  Musculoskeletal: He exhibits no edema or tenderness.  Neurological: He is alert and oriented to person, place, and time.  Skin: Skin is warm and dry.  Psychiatric: He has a normal mood and affect. His behavior is normal. Thought content normal.  Nursing note and vitals reviewed.   BP 146/67 mmHg  Pulse 77  Resp 18  Ht 5\' 6"  (1.676 m)  Wt 170 lb (77.111 kg)  BMI 27.45 kg/m2  SpO2 97%       Assessment & Plan:  1: Chronic  heart failure with reduced ejection fraction- Patient presents with fatigue upon exertion (Class II). When he does get tired, he says that he'll stop what he's doing to rest for a few minutes until his energy level improves. Says that he's weighing himself daily but is unable to tell me what he weighs but says that it's been stable. Encouraged him to write his weight down and reminded him to call for an overnight weight gain of >2 pounds or a weekly weight gain of >5 pounds. He is not adding any salt to his food and tries to follow a low sodium diet. Will stop his lisinopril and begin entresto. He was instructed to take his first dose of entresto tomorrow evening after the 36 hour washout period and then begin taking it twice daily on Friday 01/11/16. Written instructions were given to him about this. Will check a basic metabolic panel at his next visit. Could consider increasing his metoprolol at his next visit. [facility] went in and reviewed medication list with patient. Walked into the office with his cane instead of his walker today. 2: HTN- Blood pressure looks good today.  3: Diabetes- He says that he's checking it but doesn't check  it on a daily basis. Says that the last time he checked it, it was in the 130's. Encouraged him to check it on a daily basis at least since he does use insulin. 4: Tobacco use- Patient has resumed smoking and says that he's smoking about 1/2 ppd. Encouraged him to reduce his smoking and complete cessation was discussed for 3 minutes with him.  Patient did not bring his medication bottles nor a list with him as requested. Discussed the importance of bringing his medication bottles with him to his appointments so that we can make sure we have an accurate list of what he's taking.   Also told him that [facility] has left 2 messages (based on epic notes). [facility]'s name/phone number printed out for him and encouraged him to call them back.  Return here in 1 month or sooner for any  questions/problems before then.

## 2016-01-15 ENCOUNTER — Emergency Department: Payer: PPO

## 2016-01-15 ENCOUNTER — Emergency Department
Admission: EM | Admit: 2016-01-15 | Discharge: 2016-01-15 | Disposition: A | Discharge status: Home / Self Care | Payer: PPO | Source: Home / Self Care | Attending: Emergency Medicine | Admitting: Emergency Medicine

## 2016-01-15 DIAGNOSIS — I5022 Chronic systolic (congestive) heart failure: Secondary | ICD-10-CM | POA: Diagnosis not present

## 2016-01-15 DIAGNOSIS — R531 Weakness: Secondary | ICD-10-CM

## 2016-01-15 DIAGNOSIS — Z7901 Long term (current) use of anticoagulants: Secondary | ICD-10-CM

## 2016-01-15 DIAGNOSIS — R0902 Hypoxemia: Secondary | ICD-10-CM | POA: Diagnosis present

## 2016-01-15 DIAGNOSIS — Z794 Long term (current) use of insulin: Secondary | ICD-10-CM | POA: Insufficient documentation

## 2016-01-15 DIAGNOSIS — Z951 Presence of aortocoronary bypass graft: Secondary | ICD-10-CM

## 2016-01-15 DIAGNOSIS — Z79899 Other long term (current) drug therapy: Secondary | ICD-10-CM

## 2016-01-15 DIAGNOSIS — I25119 Atherosclerotic heart disease of native coronary artery with unspecified angina pectoris: Secondary | ICD-10-CM | POA: Diagnosis not present

## 2016-01-15 DIAGNOSIS — Z7982 Long term (current) use of aspirin: Secondary | ICD-10-CM | POA: Insufficient documentation

## 2016-01-15 DIAGNOSIS — N179 Acute kidney failure, unspecified: Secondary | ICD-10-CM | POA: Diagnosis not present

## 2016-01-15 DIAGNOSIS — I639 Cerebral infarction, unspecified: Secondary | ICD-10-CM

## 2016-01-15 DIAGNOSIS — K219 Gastro-esophageal reflux disease without esophagitis: Secondary | ICD-10-CM | POA: Diagnosis present

## 2016-01-15 DIAGNOSIS — E785 Hyperlipidemia, unspecified: Secondary | ICD-10-CM | POA: Diagnosis not present

## 2016-01-15 DIAGNOSIS — Z8249 Family history of ischemic heart disease and other diseases of the circulatory system: Secondary | ICD-10-CM

## 2016-01-15 DIAGNOSIS — E119 Type 2 diabetes mellitus without complications: Secondary | ICD-10-CM | POA: Diagnosis present

## 2016-01-15 DIAGNOSIS — Z8673 Personal history of transient ischemic attack (TIA), and cerebral infarction without residual deficits: Secondary | ICD-10-CM

## 2016-01-15 DIAGNOSIS — M6281 Muscle weakness (generalized): Secondary | ICD-10-CM | POA: Diagnosis not present

## 2016-01-15 DIAGNOSIS — I509 Heart failure, unspecified: Secondary | ICD-10-CM | POA: Insufficient documentation

## 2016-01-15 DIAGNOSIS — I472 Ventricular tachycardia: Secondary | ICD-10-CM | POA: Diagnosis not present

## 2016-01-15 DIAGNOSIS — J449 Chronic obstructive pulmonary disease, unspecified: Secondary | ICD-10-CM | POA: Insufficient documentation

## 2016-01-15 DIAGNOSIS — Z801 Family history of malignant neoplasm of trachea, bronchus and lung: Secondary | ICD-10-CM | POA: Diagnosis not present

## 2016-01-15 DIAGNOSIS — R9431 Abnormal electrocardiogram [ECG] [EKG]: Secondary | ICD-10-CM | POA: Diagnosis not present

## 2016-01-15 DIAGNOSIS — J111 Influenza due to unidentified influenza virus with other respiratory manifestations: Secondary | ICD-10-CM | POA: Diagnosis present

## 2016-01-15 DIAGNOSIS — J441 Chronic obstructive pulmonary disease with (acute) exacerbation: Principal | ICD-10-CM | POA: Diagnosis present

## 2016-01-15 DIAGNOSIS — I110 Hypertensive heart disease with heart failure: Secondary | ICD-10-CM

## 2016-01-15 DIAGNOSIS — R05 Cough: Secondary | ICD-10-CM | POA: Diagnosis not present

## 2016-01-15 DIAGNOSIS — R0602 Shortness of breath: Secondary | ICD-10-CM | POA: Diagnosis not present

## 2016-01-15 DIAGNOSIS — R509 Fever, unspecified: Secondary | ICD-10-CM | POA: Diagnosis not present

## 2016-01-15 DIAGNOSIS — F17210 Nicotine dependence, cigarettes, uncomplicated: Secondary | ICD-10-CM | POA: Diagnosis present

## 2016-01-15 DIAGNOSIS — I10 Essential (primary) hypertension: Secondary | ICD-10-CM | POA: Diagnosis not present

## 2016-01-15 DIAGNOSIS — R000 Tachycardia, unspecified: Secondary | ICD-10-CM | POA: Diagnosis present

## 2016-01-15 DIAGNOSIS — I2510 Atherosclerotic heart disease of native coronary artery without angina pectoris: Secondary | ICD-10-CM | POA: Insufficient documentation

## 2016-01-15 DIAGNOSIS — J209 Acute bronchitis, unspecified: Secondary | ICD-10-CM | POA: Insufficient documentation

## 2016-01-15 LAB — CBC WITH DIFFERENTIAL/PLATELET
BASOS PCT: 1 %
Basophils Absolute: 0.1 10*3/uL (ref 0–0.1)
Eosinophils Absolute: 0.1 10*3/uL (ref 0–0.7)
Eosinophils Relative: 0 %
HEMATOCRIT: 42.7 % (ref 40.0–52.0)
HEMOGLOBIN: 14.4 g/dL (ref 13.0–18.0)
LYMPHS ABS: 0.8 10*3/uL — AB (ref 1.0–3.6)
LYMPHS PCT: 6 %
MCH: 29.6 pg (ref 26.0–34.0)
MCHC: 33.7 g/dL (ref 32.0–36.0)
MCV: 87.8 fL (ref 80.0–100.0)
MONO ABS: 0.9 10*3/uL (ref 0.2–1.0)
MONOS PCT: 7 %
NEUTROS ABS: 11.6 10*3/uL — AB (ref 1.4–6.5)
NEUTROS PCT: 86 %
Platelets: 208 10*3/uL (ref 150–440)
RBC: 4.87 MIL/uL (ref 4.40–5.90)
RDW: 15.4 % — AB (ref 11.5–14.5)
WBC: 13.5 10*3/uL — ABNORMAL HIGH (ref 3.8–10.6)

## 2016-01-15 LAB — COMPREHENSIVE METABOLIC PANEL
ALK PHOS: 88 U/L (ref 38–126)
ALT: 14 U/L — AB (ref 17–63)
AST: 22 U/L (ref 15–41)
Albumin: 3.9 g/dL (ref 3.5–5.0)
Anion gap: 9 (ref 5–15)
BUN: 18 mg/dL (ref 6–20)
CALCIUM: 8.6 mg/dL — AB (ref 8.9–10.3)
CO2: 22 mmol/L (ref 22–32)
CREATININE: 1.66 mg/dL — AB (ref 0.61–1.24)
Chloride: 103 mmol/L (ref 101–111)
GFR, EST AFRICAN AMERICAN: 47 mL/min — AB (ref >60)
GFR, EST NON AFRICAN AMERICAN: 40 mL/min — AB (ref >60)
Glucose, Bld: 111 mg/dL — ABNORMAL HIGH (ref 65–99)
Potassium: 3.9 mmol/L (ref 3.5–5.1)
Sodium: 134 mmol/L — ABNORMAL LOW (ref 135–145)
Total Bilirubin: 1 mg/dL (ref 0.3–1.2)
Total Protein: 6.9 g/dL (ref 6.5–8.1)

## 2016-01-15 LAB — URINALYSIS COMPLETE WITH MICROSCOPIC (ARMC ONLY)
BACTERIA UA: NONE SEEN
Bilirubin Urine: NEGATIVE
Glucose, UA: NEGATIVE mg/dL
KETONES UR: NEGATIVE mg/dL
NITRITE: NEGATIVE
PH: 5 (ref 5.0–8.0)
PROTEIN: 100 mg/dL — AB
SPECIFIC GRAVITY, URINE: 1.01 (ref 1.005–1.030)

## 2016-01-15 LAB — LACTIC ACID, PLASMA: LACTIC ACID, VENOUS: 1.7 mmol/L (ref 0.5–2.0)

## 2016-01-15 NOTE — ED Notes (Signed)
Brief changed at this time. Blue scrubs pants put on patient due to incontinence.

## 2016-01-15 NOTE — ED Notes (Signed)
Patient transported to x-ray. ?

## 2016-01-15 NOTE — Discharge Instructions (Signed)
You were evaluated for generalized weakness, and upper respiratory symptoms which I suspect are due to a virus/bronchitis.  Continued use your albuterol at home for any wheezing. Next and return to the emergency department for any worsening condition including confusion altered mental status, fever, trouble breathing, chest pain, one-sided weakness or numbness, slurred speech, facial droop, or any other symptoms concerning to you.   Weakness Weakness is a lack of strength. You may feel weak all over your body or just in one part of your body. Weakness can be serious. In some cases, you may need more medical tests. HOME [place] a well-balanced diet.  Try to exercise every day.  Only take medicines as told by your doctor. GET HELP RIGHT AWAY IF:   You cannot do your normal daily activities.  You cannot walk up and down stairs, or you feel very tired when you do so.  You have shortness of breath or chest pain.  You have trouble moving parts of your body.  You have weakness in only one body part or on only one side of the body.  You have a fever.  You have trouble speaking or swallowing.  You cannot control when you pee (urinate) or poop (bowel movement).  You have black or bloody throw up (vomit) or poop.  Your weakness gets worse or spreads to other body parts.  You have new aches or pains. MAKE SURE YOU:   Understand these instructions.  Will watch your condition.  Will get help right away if you are not doing well or get worse.   This information is not intended to replace advice given to you by your health care provider. Make sure you discuss any questions you have with your health care provider.   Document Released: 10/02/2008 Document Revised: 04/20/2012 Document Reviewed: 12/19/2011 Elsevier Interactive Patient Education 2016 Elsevier Inc. Acute Bronchitis Bronchitis is inflammation of the airways that extend from the windpipe into the lungs  (bronchi). The inflammation often causes mucus to develop. This leads to a cough, which is the most common symptom of bronchitis.  In acute bronchitis, the condition usually develops suddenly and goes away over time, usually in a couple weeks. Smoking, allergies, and asthma can make bronchitis worse. Repeated episodes of bronchitis may cause further lung problems.  CAUSES Acute bronchitis is most often caused by the same virus that causes a cold. The virus can spread from person to person (contagious) through coughing, sneezing, and touching contaminated objects. SIGNS AND SYMPTOMS   Cough.   Fever.   Coughing up mucus.   Body aches.   Chest congestion.   Chills.   Shortness of breath.   Sore throat.  DIAGNOSIS  Acute bronchitis is usually diagnosed through a physical exam. Your health care provider will also ask you questions about your medical history. Tests, such as chest X-rays, are sometimes done to rule out other conditions.  TREATMENT  Acute bronchitis usually goes away in a couple weeks. Oftentimes, no medical treatment is necessary. Medicines are sometimes given for relief of fever or cough. Antibiotic medicines are usually not needed but may be prescribed in certain situations. In some cases, an inhaler may be recommended to help reduce shortness of breath and control the cough. A cool mist vaporizer may also be used to help thin bronchial secretions and make it easier to clear the chest.  HOME CARE INSTRUCTIONS  Get plenty of rest.   Drink enough fluids to keep your urine clear or  pale yellow (unless you have a medical condition that requires fluid restriction). Increasing fluids may help thin your respiratory secretions (sputum) and reduce chest congestion, and it will prevent dehydration.   Take medicines only as directed by your health care provider.  If you were prescribed an antibiotic medicine, finish it all even if you start to feel better.  Avoid  smoking and secondhand smoke. Exposure to cigarette smoke or irritating chemicals will make bronchitis worse. If you are a smoker, consider using nicotine gum or skin patches to help control withdrawal symptoms. Quitting smoking will help your lungs heal faster.   Reduce the chances of another bout of acute bronchitis by washing your hands frequently, avoiding people with cold symptoms, and trying not to touch your hands to your mouth, nose, or eyes.   Keep all follow-up visits as directed by your health care provider.  SEEK MEDICAL CARE IF: Your symptoms do not improve after 1 week of treatment.  SEEK IMMEDIATE MEDICAL CARE IF:  You develop an increased fever or chills.   You have chest pain.   You have severe shortness of breath.  You have bloody sputum.   You develop dehydration.  You faint or repeatedly feel like you are going to pass out.  You develop repeated vomiting.  You develop a severe headache. MAKE SURE YOU:   Understand these instructions.  Will watch your condition.  Will get help right away if you are not doing well or get worse.   This information is not intended to replace advice given to you by your health care provider. Make sure you discuss any questions you have with your health care provider.   Document Released: 11/27/2004 Document Revised: 11/10/2014 Document Reviewed: 04/12/2013 Elsevier Interactive Patient Education [employer].

## 2016-01-15 NOTE — ED Notes (Addendum)
Pt to triage via w/c with no distress noted; family reports pt with weakness, chills & urinary frequency since 730pm, difficulty walking and approx 11min episode of confusion; st hx of same with kidney infection; pt denies any c/o at present

## 2016-01-15 NOTE — ED Provider Notes (Signed)
[facility] Emergency Department Provider Note   ____________________________________________  Time seen: Approximately 10:45pm I have reviewed the triage vital signs and the triage nursing note.  HISTORY  Chief Complaint Altered Mental Status and Weakness   Historian Patient, limited History per family/wife  HPI [name] is a 70 y.o. male here for eval after episode of generalized weakness, trouble standing up while walking to the car after dinner out tonight.  Denies altered mental status for me. Family worried about possible uti with similar symptoms.  Family thought might have fever. Positive for non productive cough, with a history of COPD. No vomiting or diarrhea, chest or abd pain.  He does use albuterol at home.  No focal weakness, no slurred speech.    Past Medical History  Diagnosis Date  . COPD (chronic obstructive pulmonary disease) ([place])   . Coronary artery disease   . Diabetes mellitus without complication ([place])   . Hypertension   . Stroke ([place])   . CHF (congestive heart failure) ([place])   . GERD (gastroesophageal reflux disease)   . Pneumonia     Patient Active Problem List   Diagnosis Date Noted  . Chronic systolic heart failure ([place]) 12/12/2015  . Essential hypertension 12/12/2015  . COPD (chronic obstructive pulmonary disease) with chronic bronchitis ([place]) 12/12/2015  . Diabetes ([place]) 12/12/2015  . Tobacco abuse 12/12/2015    Past Surgical History  Procedure Laterality Date  . Coronary artery bypass graft  2006    Current Outpatient Rx  Name  Route  Sig  Dispense  Refill  . albuterol (PROVENTIL HFA;VENTOLIN HFA) 108 (90 Base) MCG/ACT inhaler   Inhalation   Inhale 2 puffs into the lungs every 6 (six) hours as needed for wheezing or shortness of breath.         Marland Kitchen aspirin EC 81 MG tablet   Oral   Take 81 mg by mouth daily.         . clopidogrel (PLAVIX) 75 MG tablet   Oral   Take 75 mg by mouth daily.         .  Fluticasone-Salmeterol (ADVAIR DISKUS) 250-50 MCG/DOSE AEPB   Inhalation   Inhale 1 puff into the lungs 2 (two) times daily.   60 each   0   . furosemide (LASIX) 40 MG tablet   Oral   Take 40 mg by mouth 2 (two) times daily.         Marland Kitchen gabapentin (NEURONTIN) 100 MG capsule   Oral   Take 100 mg by mouth as needed.          . insulin glargine (LANTUS) 100 UNIT/ML injection   Subcutaneous   Inject 50 Units into the skin at bedtime.          . isosorbide mononitrate (IMDUR) 30 MG 24 hr tablet   Oral   Take 30 mg by mouth daily.         . metFORMIN (GLUCOPHAGE) 1000 MG tablet   Oral   Take 1,000 mg by mouth 2 (two) times daily with a meal.         . metoprolol (LOPRESSOR) 50 MG tablet   Oral   Take 50 mg by mouth 2 (two) times daily.         . Multiple Vitamin (MULTIVITAMIN WITH MINERALS) TABS tablet   Oral   Take 1 tablet by mouth daily.         . multivitamin-lutein (OCUVITE-LUTEIN) CAPS capsule   Oral  Take 1 capsule by mouth daily.         Marland Kitchen omeprazole (PRILOSEC) 20 MG capsule   Oral   Take 20 mg by mouth daily.         . sacubitril-valsartan (ENTRESTO) 24-26 MG   Oral   Take 1 tablet by mouth 2 (two) times daily.   60 tablet   3   . simvastatin (ZOCOR) 20 MG tablet   Oral   Take 20 mg by mouth at bedtime.           Allergies Review of patient's allergies indicates no known allergies.  Family History  Problem Relation Age of Onset  . Hypertension Other   . Lung cancer Mother   . Heart attack Father     Social History Social History  Substance Use Topics  . Smoking status: Current Every Day Smoker -- 0.50 packs/day for 50 years    Types: Cigarettes  . Smokeless tobacco: Never Used  . Alcohol Use: No    Review of Systems  Constitutional: Possible subjective fever. Eyes: Negative for visual changes. ENT: Negative for sore throat. Cardiovascular: Negative for chest pain. Respiratory: Negative for shortness of  breath. Gastrointestinal: Negative for abdominal pain, vomiting and diarrhea. Genitourinary: Negative for dysuria. Musculoskeletal: Negative for back pain. Skin: Negative for rash. Neurological: Negative for headache. 10 point Review of Systems otherwise negative ____________________________________________   PHYSICAL EXAM:  VITAL SIGNS: ED Triage Vitals  Enc Vitals Group     BP 01/15/16 2104 150/55 mmHg     Pulse Rate 01/15/16 2104 87     Resp 01/15/16 2104 20     Temp 01/15/16 2104 100 F (37.8 C)     Temp Source 01/15/16 2104 Oral     SpO2 01/15/16 2104 93 %     Weight 01/15/16 2104 170 lb (77.111 kg)     Height 01/15/16 2104 5\' 6"  (1.676 m)     Head Cir --      Peak Flow --      Pain Score --      Pain Loc --      Pain Edu? --      Excl. in [place]? --      Constitutional: Alert and oriented. Well appearing and in no distress. HEENT   Head: Normocephalic and atraumatic.      Eyes: Conjunctivae are normal. PERRL. Normal extraocular movements.      Ears:         Nose: No congestion/rhinnorhea.   Mouth/Throat: Mucous membranes are moist.   Neck: No stridor. Cardiovascular/Chest: Normal rate, regular rhythm.  No murmurs, rubs, or gallops. Respiratory: Normal respiratory effort without tachypnea nor retractions.Mild end expiratory wheeze. Mild occasional rhonchi. Gastrointestinal: Soft. No distention, no guarding, no rebound. Nontender.    Genitourinary/rectal:Deferred Musculoskeletal: Nontender with normal range of motion in all extremities. No joint effusions.  No lower extremity tenderness.  No edema. Neurologic:  No aphasia. No slurred speech. Cranial nerves II through X intact. Finger-nose intact. No pronator drift. Equal somewhat generalized weakness 4-5 and 4 extremities. No sensory deficits Skin:  Skin is warm, dry and intact. No rash noted. Psychiatric: Mood and affect are normal. Speech and behavior are normal. Patient exhibits appropriate insight and  judgment.  ____________________________________________   EKG I, Lisa Roca, MD, the attending physician have personally viewed and interpreted all ECGs.  88 bpm. Normal sinus rhythm. Normal axis. Nonspecific T-wave with T-wave inversion laterally ____________________________________________  LABS (pertinent positives/negatives)  Comprehensive metabolic panel significant  for creatinine 1.66 which appears to be baseline Sodium is 134 White blood count 13.5, hemoglobin 14.4 platelet count 28 Urinalysis trace leukocytes, 6-30 white blood cells and no bacteria and negative for nitrites  lactate 1.7  ____________________________________________  RADIOLOGY All Xrays were viewed by me. Imaging interpreted by Radiologist.  Chest 2 view:  IMPRESSION: 1. No acute finding. 2. COPD and chronic left pleural scarring. __________________________________________  PROCEDURES  Procedure(s) performed: None  Critical Care performed: None  ____________________________________________   ED COURSE / ASSESSMENT AND PLAN  Pertinent labs & imaging results that were available during my care of the patient were reviewed by me and considered in my medical decision making (see chart for details).   The patient and family are here mostly to figure out if he might have a UTI, because he had generalized weakness with a prior UTI. Abdomen exam is nontender. His urinalysis shows white blood cells and leukocytes without nitrites or bacteria. I discussed this with the family. Urine culture will be sent.  Nonfocal neurologic exam, and by history no focal symptoms to make me concerned about stroke.  Patient states he feels better now. I suspect he may have some generalized weakness due to the likely viral upper respiratory infection he is currently fighting. Chest x-ray is reassuring. No hypoxia. Respiratory symptoms would not have otherwise brought him to the ED and I don't think that he needs  prednisone at this point in time.  ECG is unchanged and is having no chest pain, I do not suspect ACS.    CONSULTATIONS:   None   Patient / Family / Caregiver informed of clinical course, medical decision-making process, and agree with plan.   I discussed return precautions, follow-up instructions, and discharged instructions with patient and/or family.   ___________________________________________   FINAL CLINICAL IMPRESSION(S) / ED DIAGNOSES   Final diagnoses:  Acute bronchitis, unspecified organism  Generalized weakness              Note: This dictation was prepared with Dragon dictation. Any transcriptional errors that result from this process are unintentional   Lisa Roca, MD 01/15/16 2259

## 2016-01-16 ENCOUNTER — Other Ambulatory Visit: Payer: Self-pay | Admitting: *Deleted

## 2016-01-16 ENCOUNTER — Encounter: Payer: Self-pay | Admitting: *Deleted

## 2016-01-16 NOTE — Patient Outreach (Addendum)
[facility]) Care Management  01/16/2016  [name] [name] [id number]  No patient response from [name] Management outreach, will proceed with case closure.    Objective: Per Epic case review: Patient has a history of COPD, diabetes, chronic systolic congestive heart failure, CAD and hypertension. Patient's last appointment with [name] at [facility] was on 12/12/15. Patient hospitalized 12/03/15 - [id number] for acute systolic congestive heart failure,acute on chronic respiratory failure with hypoxia , suspected community-acquired pneumonia, acute on chronic COPD exacerbation, elevated troponin due to demand ischemia, and GERD.   Patient's cardiology provider also encouraged patient to call [place] [facility] during 01/09/16 office visit.   Assessment: Received [facility] referral on 12/19/15. Referral source: [name] or [facility] Referral reason: Nursing/ teaching support, diagnosis congestive heart failure and COPD. Requesting: [facility] RNCM for face to face visit.  No patient response from [name] Management outreach.   Plan: RNCM will send case closure letter due to unable to contact to patient's primary MD. [name] will send case closure request due to unable to contact patient to [name] at [name] [facility].    [name] H. Annia Friendly, BSN, [place] [facility] Telephonic CM Phone: [phone] Fax: [phone]

## 2016-01-17 ENCOUNTER — Emergency Department: Payer: PPO

## 2016-01-17 ENCOUNTER — Encounter: Payer: Self-pay | Admitting: Emergency Medicine

## 2016-01-17 ENCOUNTER — Inpatient Hospital Stay
Admission: EM | Admit: 2016-01-17 | Discharge: 2016-01-20 | DRG: 191 | Disposition: A | Discharge status: Home / Self Care | Payer: PPO | Attending: Internal Medicine | Admitting: Internal Medicine

## 2016-01-17 DIAGNOSIS — E119 Type 2 diabetes mellitus without complications: Secondary | ICD-10-CM | POA: Diagnosis not present

## 2016-01-17 DIAGNOSIS — I5022 Chronic systolic (congestive) heart failure: Secondary | ICD-10-CM

## 2016-01-17 DIAGNOSIS — Z951 Presence of aortocoronary bypass graft: Secondary | ICD-10-CM | POA: Diagnosis not present

## 2016-01-17 DIAGNOSIS — F17210 Nicotine dependence, cigarettes, uncomplicated: Secondary | ICD-10-CM | POA: Diagnosis not present

## 2016-01-17 DIAGNOSIS — I25119 Atherosclerotic heart disease of native coronary artery with unspecified angina pectoris: Secondary | ICD-10-CM | POA: Diagnosis not present

## 2016-01-17 DIAGNOSIS — I10 Essential (primary) hypertension: Secondary | ICD-10-CM | POA: Diagnosis not present

## 2016-01-17 DIAGNOSIS — R000 Tachycardia, unspecified: Secondary | ICD-10-CM | POA: Diagnosis not present

## 2016-01-17 DIAGNOSIS — J209 Acute bronchitis, unspecified: Secondary | ICD-10-CM | POA: Diagnosis not present

## 2016-01-17 DIAGNOSIS — Z794 Long term (current) use of insulin: Secondary | ICD-10-CM | POA: Diagnosis not present

## 2016-01-17 DIAGNOSIS — I2510 Atherosclerotic heart disease of native coronary artery without angina pectoris: Secondary | ICD-10-CM | POA: Diagnosis not present

## 2016-01-17 DIAGNOSIS — I110 Hypertensive heart disease with heart failure: Secondary | ICD-10-CM | POA: Diagnosis not present

## 2016-01-17 DIAGNOSIS — Z801 Family history of malignant neoplasm of trachea, bronchus and lung: Secondary | ICD-10-CM | POA: Diagnosis not present

## 2016-01-17 DIAGNOSIS — N179 Acute kidney failure, unspecified: Secondary | ICD-10-CM | POA: Diagnosis not present

## 2016-01-17 DIAGNOSIS — R509 Fever, unspecified: Secondary | ICD-10-CM | POA: Diagnosis not present

## 2016-01-17 DIAGNOSIS — J111 Influenza due to unidentified influenza virus with other respiratory manifestations: Secondary | ICD-10-CM | POA: Diagnosis not present

## 2016-01-17 DIAGNOSIS — Z8249 Family history of ischemic heart disease and other diseases of the circulatory system: Secondary | ICD-10-CM | POA: Diagnosis not present

## 2016-01-17 DIAGNOSIS — Z8673 Personal history of transient ischemic attack (TIA), and cerebral infarction without residual deficits: Secondary | ICD-10-CM | POA: Diagnosis not present

## 2016-01-17 DIAGNOSIS — R0602 Shortness of breath: Secondary | ICD-10-CM | POA: Diagnosis not present

## 2016-01-17 DIAGNOSIS — I472 Ventricular tachycardia: Secondary | ICD-10-CM | POA: Diagnosis not present

## 2016-01-17 DIAGNOSIS — R05 Cough: Secondary | ICD-10-CM | POA: Diagnosis not present

## 2016-01-17 DIAGNOSIS — Z79899 Other long term (current) drug therapy: Secondary | ICD-10-CM | POA: Diagnosis not present

## 2016-01-17 DIAGNOSIS — K219 Gastro-esophageal reflux disease without esophagitis: Secondary | ICD-10-CM | POA: Diagnosis not present

## 2016-01-17 DIAGNOSIS — E785 Hyperlipidemia, unspecified: Secondary | ICD-10-CM | POA: Diagnosis not present

## 2016-01-17 DIAGNOSIS — J441 Chronic obstructive pulmonary disease with (acute) exacerbation: Secondary | ICD-10-CM | POA: Diagnosis not present

## 2016-01-17 DIAGNOSIS — R531 Weakness: Secondary | ICD-10-CM | POA: Diagnosis not present

## 2016-01-17 DIAGNOSIS — R0902 Hypoxemia: Secondary | ICD-10-CM | POA: Diagnosis not present

## 2016-01-17 DIAGNOSIS — Z7982 Long term (current) use of aspirin: Secondary | ICD-10-CM | POA: Diagnosis not present

## 2016-01-17 DIAGNOSIS — M6281 Muscle weakness (generalized): Secondary | ICD-10-CM | POA: Diagnosis not present

## 2016-01-17 DIAGNOSIS — R9431 Abnormal electrocardiogram [ECG] [EKG]: Secondary | ICD-10-CM | POA: Diagnosis not present

## 2016-01-17 LAB — CBC WITH DIFFERENTIAL/PLATELET
Basophils Absolute: 0 10*3/uL (ref 0–0.1)
Basophils Relative: 0 %
Eosinophils Absolute: 0 10*3/uL (ref 0–0.7)
Eosinophils Relative: 0 %
HCT: 40.6 % (ref 40.0–52.0)
Hemoglobin: 13.4 g/dL (ref 13.0–18.0)
LYMPHS ABS: 0.4 10*3/uL — AB (ref 1.0–3.6)
LYMPHS PCT: 5 %
MCH: 28.9 pg (ref 26.0–34.0)
MCHC: 33 g/dL (ref 32.0–36.0)
MCV: 87.5 fL (ref 80.0–100.0)
MONOS PCT: 6 %
Monocytes Absolute: 0.4 10*3/uL (ref 0.2–1.0)
NEUTROS ABS: 6.5 10*3/uL (ref 1.4–6.5)
NEUTROS PCT: 89 %
PLATELETS: 168 10*3/uL (ref 150–440)
RBC: 4.64 MIL/uL (ref 4.40–5.90)
RDW: 15.7 % — ABNORMAL HIGH (ref 11.5–14.5)
WBC: 7.3 10*3/uL (ref 3.8–10.6)

## 2016-01-17 LAB — RAPID INFLUENZA A&B ANTIGENS
Influenza A (ARMC): POSITIVE — AB
Influenza B (ARMC): NEGATIVE

## 2016-01-17 LAB — BASIC METABOLIC PANEL
Anion gap: 15 (ref 5–15)
BUN: 24 mg/dL — AB (ref 6–20)
CHLORIDE: 100 mmol/L — AB (ref 101–111)
CO2: 18 mmol/L — ABNORMAL LOW (ref 22–32)
CREATININE: 2.02 mg/dL — AB (ref 0.61–1.24)
Calcium: 8.5 mg/dL — ABNORMAL LOW (ref 8.9–10.3)
GFR calc Af Amer: 37 mL/min — ABNORMAL LOW (ref >60)
GFR, EST NON AFRICAN AMERICAN: 32 mL/min — AB (ref >60)
GLUCOSE: 296 mg/dL — AB (ref 65–99)
POTASSIUM: 3.3 mmol/L — AB (ref 3.5–5.1)
SODIUM: 133 mmol/L — AB (ref 135–145)

## 2016-01-17 LAB — URINE CULTURE

## 2016-01-17 LAB — MAGNESIUM: MAGNESIUM: 2 mg/dL (ref 1.7–2.4)

## 2016-01-17 LAB — GLUCOSE, CAPILLARY: GLUCOSE-CAPILLARY: 528 mg/dL — AB (ref 65–99)

## 2016-01-17 LAB — TROPONIN I: TROPONIN I: 0.05 ng/mL — AB (ref <0.031)

## 2016-01-17 MED ORDER — CLOPIDOGREL BISULFATE 75 MG PO TABS
75.0000 mg | ORAL_TABLET | Freq: Every day | ORAL | Status: DC
  Administered 2016-01-18 – 2016-01-20 (×3): 75 mg via ORAL
  Filled 2016-01-17 (×3): qty 1

## 2016-01-17 MED ORDER — METHYLPREDNISOLONE SODIUM SUCC 125 MG IJ SOLR
60.0000 mg | INTRAMUSCULAR | Status: DC
  Administered 2016-01-18: 60 mg via INTRAVENOUS
  Administered 2016-01-18: 125 mg via INTRAVENOUS
  Filled 2016-01-17 (×2): qty 2

## 2016-01-17 MED ORDER — ASPIRIN EC 81 MG PO TBEC
81.0000 mg | DELAYED_RELEASE_TABLET | Freq: Every day | ORAL | Status: DC
  Administered 2016-01-18 – 2016-01-20 (×3): 81 mg via ORAL
  Filled 2016-01-17 (×3): qty 1

## 2016-01-17 MED ORDER — MOMETASONE FURO-FORMOTEROL FUM 200-5 MCG/ACT IN AERO
2.0000 | INHALATION_SPRAY | Freq: Two times a day (BID) | RESPIRATORY_TRACT | Status: DC
  Administered 2016-01-18 – 2016-01-20 (×6): 2 via RESPIRATORY_TRACT
  Filled 2016-01-17: qty 8.8

## 2016-01-17 MED ORDER — OCUVITE-LUTEIN PO CAPS
1.0000 | ORAL_CAPSULE | Freq: Every day | ORAL | Status: DC
  Administered 2016-01-19 – 2016-01-20 (×2): 1 via ORAL
  Filled 2016-01-17 (×3): qty 1

## 2016-01-17 MED ORDER — ACETAMINOPHEN 325 MG PO TABS: 650.0000 mg | ORAL_TABLET | Freq: Four times a day (QID) | ORAL | Status: DC | PRN

## 2016-01-17 MED ORDER — PANTOPRAZOLE SODIUM 40 MG PO TBEC
40.0000 mg | DELAYED_RELEASE_TABLET | Freq: Every day | ORAL | Status: DC
  Administered 2016-01-18 – 2016-01-20 (×3): 40 mg via ORAL
  Filled 2016-01-17 (×3): qty 1

## 2016-01-17 MED ORDER — POTASSIUM CHLORIDE CRYS ER 20 MEQ PO TBCR: 40.0000 meq | EXTENDED_RELEASE_TABLET | Freq: Once | ORAL | Status: DC

## 2016-01-17 MED ORDER — ADULT MULTIVITAMIN W/MINERALS CH
1.0000 | ORAL_TABLET | Freq: Every day | ORAL | Status: DC
  Administered 2016-01-18 – 2016-01-20 (×3): 1 via ORAL
  Filled 2016-01-17 (×3): qty 1

## 2016-01-17 MED ORDER — ONDANSETRON HCL 4 MG/2ML IJ SOLN
4.0000 mg | Freq: Four times a day (QID) | INTRAMUSCULAR | Status: DC | PRN
Start: 2016-01-17 — End: 2016-01-20

## 2016-01-17 MED ORDER — INSULIN GLARGINE 100 UNIT/ML ~~LOC~~ SOLN
50.0000 [IU] | Freq: Every day | SUBCUTANEOUS | Status: DC
  Administered 2016-01-18 – 2016-01-19 (×3): 50 [IU] via SUBCUTANEOUS
  Filled 2016-01-17 (×4): qty 0.5

## 2016-01-17 MED ORDER — ALBUTEROL SULFATE (2.5 MG/3ML) 0.083% IN NEBU
7.5000 mg | INHALATION_SOLUTION | Freq: Once | RESPIRATORY_TRACT | Status: AC
  Administered 2016-01-17: 7.5 mg via RESPIRATORY_TRACT
  Filled 2016-01-17: qty 9

## 2016-01-17 MED ORDER — ISOSORBIDE MONONITRATE ER 30 MG PO TB24
30.0000 mg | ORAL_TABLET | Freq: Every day | ORAL | Status: DC
  Administered 2016-01-18 – 2016-01-20 (×3): 30 mg via ORAL
  Filled 2016-01-17 (×3): qty 1

## 2016-01-17 MED ORDER — SIMVASTATIN 20 MG PO TABS
20.0000 mg | ORAL_TABLET | Freq: Every day | ORAL | Status: DC
  Administered 2016-01-18 – 2016-01-19 (×3): 20 mg via ORAL
  Filled 2016-01-17 (×3): qty 1

## 2016-01-17 MED ORDER — POTASSIUM CHLORIDE CRYS ER 20 MEQ PO TBCR
40.0000 meq | EXTENDED_RELEASE_TABLET | Freq: Once | ORAL | Status: AC
  Administered 2016-01-18: 40 meq via ORAL
  Filled 2016-01-17: qty 2

## 2016-01-17 MED ORDER — MORPHINE SULFATE (PF) 2 MG/ML IV SOLN: 2.0000 mg | INTRAVENOUS | Status: DC | PRN

## 2016-01-17 MED ORDER — OXYCODONE HCL 5 MG PO TABS: 5.0000 mg | ORAL_TABLET | ORAL | Status: DC | PRN

## 2016-01-17 MED ORDER — ONDANSETRON HCL 4 MG PO TABS: 4.0000 mg | ORAL_TABLET | Freq: Four times a day (QID) | ORAL | Status: DC | PRN

## 2016-01-17 MED ORDER — DOXYCYCLINE HYCLATE 100 MG IV SOLR
100.0000 mg | Freq: Once | INTRAVENOUS | Status: AC
  Administered 2016-01-17: 100 mg via INTRAVENOUS
  Filled 2016-01-17: qty 100

## 2016-01-17 MED ORDER — INSULIN ASPART 100 UNIT/ML ~~LOC~~ SOLN
0.0000 [IU] | Freq: Three times a day (TID) | SUBCUTANEOUS | Status: DC
  Administered 2016-01-18: 8 [IU] via SUBCUTANEOUS
  Administered 2016-01-18: 5 [IU] via SUBCUTANEOUS
  Filled 2016-01-17: qty 5
  Filled 2016-01-17: qty 8

## 2016-01-17 MED ORDER — INSULIN ASPART 100 UNIT/ML ~~LOC~~ SOLN
0.0000 [IU] | Freq: Every day | SUBCUTANEOUS | Status: DC
  Administered 2016-01-19: 3 [IU] via SUBCUTANEOUS
  Filled 2016-01-17: qty 15
  Filled 2016-01-17: qty 3

## 2016-01-17 MED ORDER — SACUBITRIL-VALSARTAN 24-26 MG PO TABS
1.0000 | ORAL_TABLET | Freq: Two times a day (BID) | ORAL | Status: DC
  Administered 2016-01-18 – 2016-01-20 (×6): 1 via ORAL
  Filled 2016-01-17 (×7): qty 1

## 2016-01-17 MED ORDER — METOPROLOL TARTRATE 50 MG PO TABS
50.0000 mg | ORAL_TABLET | Freq: Two times a day (BID) | ORAL | Status: DC
  Administered 2016-01-18 – 2016-01-20 (×6): 50 mg via ORAL
  Filled 2016-01-17 (×6): qty 1

## 2016-01-17 MED ORDER — IPRATROPIUM-ALBUTEROL 0.5-2.5 (3) MG/3ML IN SOLN: 3.0000 mL | RESPIRATORY_TRACT | Status: DC | PRN

## 2016-01-17 MED ORDER — ENOXAPARIN SODIUM 40 MG/0.4ML ~~LOC~~ SOLN
40.0000 mg | SUBCUTANEOUS | Status: DC
  Administered 2016-01-18 – 2016-01-19 (×3): 40 mg via SUBCUTANEOUS
  Filled 2016-01-17 (×3): qty 0.4

## 2016-01-17 MED ORDER — SODIUM CHLORIDE 0.9 % IV SOLN
INTRAVENOUS | Status: DC
  Administered 2016-01-18 (×2): via INTRAVENOUS

## 2016-01-17 MED ORDER — ALBUTEROL SULFATE HFA 108 (90 BASE) MCG/ACT IN AERS: 2.0000 | INHALATION_SPRAY | Freq: Four times a day (QID) | RESPIRATORY_TRACT | Status: DC | PRN

## 2016-01-17 MED ORDER — IPRATROPIUM-ALBUTEROL 0.5-2.5 (3) MG/3ML IN SOLN
3.0000 mL | Freq: Once | RESPIRATORY_TRACT | Status: AC
  Administered 2016-01-17: 3 mL via RESPIRATORY_TRACT
  Filled 2016-01-17: qty 3

## 2016-01-17 MED ORDER — ACETAMINOPHEN 650 MG RE SUPP: 650.0000 mg | Freq: Four times a day (QID) | RECTAL | Status: DC | PRN

## 2016-01-17 MED ORDER — INSULIN ASPART 100 UNIT/ML ~~LOC~~ SOLN
15.0000 [IU] | Freq: Once | SUBCUTANEOUS | Status: AC
  Administered 2016-01-18: 15 [IU] via SUBCUTANEOUS

## 2016-01-17 MED ORDER — ALBUTEROL SULFATE (2.5 MG/3ML) 0.083% IN NEBU: 2.5000 mg | INHALATION_SOLUTION | Freq: Four times a day (QID) | RESPIRATORY_TRACT | Status: DC | PRN

## 2016-01-17 MED ORDER — SODIUM CHLORIDE 0.9% FLUSH
3.0000 mL | Freq: Two times a day (BID) | INTRAVENOUS | Status: DC
  Administered 2016-01-18 – 2016-01-20 (×6): 3 mL via INTRAVENOUS

## 2016-01-17 MED ORDER — GUAIFENESIN-CODEINE 100-10 MG/5ML PO SOLN: 10.0000 mL | ORAL | Status: DC | PRN

## 2016-01-17 NOTE — Progress Notes (Signed)
Pt in afib, heart rate jumping around from 90's-120's; per the pt this is new for him. Pt is asymptomatic. Current tele strip and ED EKG look similar. MD Posey Pronto made aware. No new orders given at this time. Will continue to monitor.  [name] M

## 2016-01-17 NOTE — ED Notes (Addendum)
Lab called to report troponin of 0.05, informing MD.

## 2016-01-17 NOTE — ED Provider Notes (Signed)
[facility] Emergency Department Provider Note  ____________________________________________  Time seen: Seen upon arrival to the emergency department  I have reviewed the triage vital signs and the nursing notes.   HISTORY  Chief Complaint Shortness of Breath    HPI [name] is a 70 y.o. male with a history of CAD as well as COPD who is presenting with shortness of breath. He says that he was in the emergency room 2 days ago was diagnosed with bronchitis. Since then he has been having "coughing fits" as well as runny nose. He also thinks he is running a low-grade fever. He was in [place] up his medications today when he felt short of breath. He was found to be wheezing when EMS arrived. He was given 2 DuoNeb's with some relief. Very minimal air movement per EMS. The patient says that he just quit smoking 2 days ago after 50 years. Denies any chest pain. Denies any nausea or vomiting. Mild productive clear sputum from the cough.Patient does not use home oxygen.   Past Medical History  Diagnosis Date  . COPD (chronic obstructive pulmonary disease) ([place])   . Coronary artery disease   . Diabetes mellitus without complication ([place])   . Hypertension   . Stroke ([place])   . CHF (congestive heart failure) ([place])   . GERD (gastroesophageal reflux disease)   . Pneumonia     Patient Active Problem List   Diagnosis Date Noted  . Chronic systolic heart failure ([place]) 12/12/2015  . Essential hypertension 12/12/2015  . COPD (chronic obstructive pulmonary disease) with chronic bronchitis ([place]) 12/12/2015  . Diabetes ([place]) 12/12/2015  . Tobacco abuse 12/12/2015    Past Surgical History  Procedure Laterality Date  . Coronary artery bypass graft  2006    Current Outpatient Rx  Name  Route  Sig  Dispense  Refill  . albuterol (PROVENTIL HFA;VENTOLIN HFA) 108 (90 Base) MCG/ACT inhaler   Inhalation   Inhale 2 puffs into the lungs every 6 (six) hours as needed  for wheezing or shortness of breath.         Marland Kitchen aspirin EC 81 MG tablet   Oral   Take 81 mg by mouth daily.         . clopidogrel (PLAVIX) 75 MG tablet   Oral   Take 75 mg by mouth daily.         . Fluticasone-Salmeterol (ADVAIR DISKUS) 250-50 MCG/DOSE AEPB   Inhalation   Inhale 1 puff into the lungs 2 (two) times daily.   60 each   0   . furosemide (LASIX) 40 MG tablet   Oral   Take 40 mg by mouth 2 (two) times daily.         Marland Kitchen gabapentin (NEURONTIN) 100 MG capsule   Oral   Take 100 mg by mouth as needed.          . insulin glargine (LANTUS) 100 UNIT/ML injection   Subcutaneous   Inject 50 Units into the skin at bedtime.          . isosorbide mononitrate (IMDUR) 30 MG 24 hr tablet   Oral   Take 30 mg by mouth daily.         . metFORMIN (GLUCOPHAGE) 1000 MG tablet   Oral   Take 1,000 mg by mouth 2 (two) times daily with a meal.         . metoprolol (LOPRESSOR) 50 MG tablet   Oral   Take 50 mg  by mouth 2 (two) times daily.         . Multiple Vitamin (MULTIVITAMIN WITH MINERALS) TABS tablet   Oral   Take 1 tablet by mouth daily.         . multivitamin-lutein (OCUVITE-LUTEIN) CAPS capsule   Oral   Take 1 capsule by mouth daily.         Marland Kitchen omeprazole (PRILOSEC) 20 MG capsule   Oral   Take 20 mg by mouth daily.         . sacubitril-valsartan (ENTRESTO) 24-26 MG   Oral   Take 1 tablet by mouth 2 (two) times daily.   60 tablet   3   . simvastatin (ZOCOR) 20 MG tablet   Oral   Take 20 mg by mouth at bedtime.           Allergies Review of patient's allergies indicates no known allergies.  Family History  Problem Relation Age of Onset  . Hypertension Other   . Lung cancer Mother   . Heart attack Father     Social History Social History  Substance Use Topics  . Smoking status: Current Every Day Smoker -- 0.50 packs/day for 50 years    Types: Cigarettes  . Smokeless tobacco: Never Used  . Alcohol Use: No    Review of  Systems Constitutional: Fever Eyes: No visual changes. ENT: No sore throat. Cardiovascular: Denies chest pain. Respiratory: As above Gastrointestinal: No abdominal pain.  No nausea, no vomiting.  No diarrhea.  No constipation. Genitourinary: Negative for dysuria. Musculoskeletal: Negative for back pain. Skin: Negative for rash. Neurological: Negative for headaches, focal weakness or numbness.  10-point ROS otherwise negative.  ____________________________________________   PHYSICAL EXAM:  VITAL SIGNS: ED Triage Vitals  Enc Vitals Group     BP --      Pulse --      Resp --      Temp --      Temp src --      SpO2 --      Weight --      Height --      Head Cir --      Peak Flow --      Pain Score --      Pain Loc --      Pain Edu? --      Excl. in [place]? --     Constitutional: Alert and oriented. Well appearing and in no acute distress. Eyes: Conjunctivae are normal. PERRL. EOMI. Head: Atraumatic. Nose: No congestion/rhinnorhea. Mouth/Throat: Mucous membranes are moist.   Neck: No stridor.   Cardiovascular: Normal rate, regular rhythm. Grossly normal heart sounds.  Good peripheral circulation. Respiratory: Normal respiratory effort.  Decreased air movement throughout with a prolonged expiratory phase. Coarse rhonchi with expiratory wheezes throughout. Gastrointestinal: Soft and nontender. No distention. No abdominal bruits. No CVA tenderness. Musculoskeletal: No lower extremity tenderness nor edema.  No joint effusions. Neurologic:  Normal speech and language. No gross focal neurologic deficits are appreciated. No gait instability. Skin:  Skin is warm, dry and intact. No rash noted. Psychiatric: Mood and affect are normal. Speech and behavior are normal.  ____________________________________________   LABS (all labs ordered are listed, but only abnormal results are displayed)  Labs Reviewed  CBC WITH DIFFERENTIAL/PLATELET - Abnormal; Notable for the following:     RDW 15.7 (*)    Lymphs Abs 0.4 (*)    All other components within normal limits  BASIC METABOLIC PANEL - Abnormal; Notable for  the following:    Sodium 133 (*)    Potassium 3.3 (*)    Chloride 100 (*)    CO2 18 (*)    Glucose, Bld 296 (*)    BUN 24 (*)    Creatinine, Ser 2.02 (*)    Calcium 8.5 (*)    GFR calc non Af Amer 32 (*)    GFR calc Af Amer 37 (*)    All other components within normal limits  TROPONIN I   ____________________________________________  EKG  ED ECG REPORT I, Doran Stabler, the attending physician, personally viewed and interpreted this ECG.   Date: 01/17/2016  EKG Time: 1640  Rate: 112  Rhythm: sinus tachycardia  Axis: Normal axis  Intervals:none  ST&T Change: ST depression in 1, aVL, V5 and V6. No ST elevations. PVC times one. T-wave inversions in V5 and V6. No significant change from previous EKGs including from 01/16/2016.  ____________________________________________  RADIOLOGY  IMPRESSION: Chronic changes without acute abnormality.   Electronically Signed By: Inez Catalina M.D. On: 01/17/2016 16:40 ____________________________________________   PROCEDURES    ____________________________________________   INITIAL IMPRESSION / ASSESSMENT AND PLAN / ED COURSE  Pertinent labs & imaging results that were available during my care of the patient were reviewed by me and considered in my medical decision making (see chart for details).  ----------------------------------------- 6:28 PM on 01/17/2016 -----------------------------------------  Patient with mild improvement after nebs. Improved air movement but still with coarse wheezing throughout with strong history cough. Also with several runs of nonsustained V. tach on the monitor. Unclear of the monitor is misreading the patient's QRS, which is mildly widened at his baseline, as V. tach. However, I discussed this with Dr.Modi of the medicine service will keep him on a  monitored bed overnight. Troponin is pending at this time. ____________________________________________   FINAL CLINICAL IMPRESSION(S) / ED DIAGNOSES  COPD exacerbation. Bronchitis.     Orbie Pyo, MD 01/17/16 [phone]

## 2016-01-17 NOTE — ED Notes (Signed)
Assisted patient to use the urinal. Patient in bed resting at this time.

## 2016-01-17 NOTE — ED Notes (Signed)
Called pharmacy to check status of doxycycline, they said they were making it now.

## 2016-01-17 NOTE — Progress Notes (Signed)
Pt blood sugar 469. MD Posey Pronto made aware. Verbal order for one time dose 15 units of novolog. Will continue to monitor.  Iran Sizer M

## 2016-01-17 NOTE — ED Notes (Signed)
Per EMS, patient was at [facility] filling his Rx from a cvitis he has at [facility] just days earlier where has had a dx of Bronchitis.  Pt was feeling SOB and dizzy and called EMS.  Pt presents AOx4, and in no distress.  Sats are 95% on RA.  EMS gave 2 Duo Nebs and sats went from high 80's to 92%.

## 2016-01-17 NOTE — H&P (Signed)
[facility] [place] NAME: [name]    MR#:  [id number]  DATE OF BIRTH:  [birth year]   DATE OF ADMISSION:  01/17/2016  PRIMARY CARE PHYSICIAN: BABAOFF, Caryl Bis, MD   REQUESTING/REFERRING PHYSICIAN: [place]  CHIEF COMPLAINT:   Chief Complaint  Patient presents with  . Shortness of Breath    HISTORY OF PRESENT ILLNESS:  [name]  is a 70 y.o. male with a known history of COPD non-oxygen dependent presenting to the hospital with shortness of breath. He describes one day duration of shortness of breath mainly as a dyspnea on exertion. However, states has cough nonproductive for a few days prior. Given her respiratory issues presenting to Hospital further workup and evaluation noted be hypoxic on arrival requiring supplemental oxygen received breathing treatments and steroids with some improvement however also noted to be tachycardic with concern for beats of nonsustained ventricular tachycardia.  PAST MEDICAL HISTORY:   Past Medical History  Diagnosis Date  . COPD (chronic obstructive pulmonary disease) ([place])   . Coronary artery disease   . Diabetes mellitus without complication ([place])   . Hypertension   . Stroke ([place])   . CHF (congestive heart failure) ([place])   . GERD (gastroesophageal reflux disease)   . Pneumonia     PAST SURGICAL HISTORY:   Past Surgical History  Procedure Laterality Date  . Coronary artery bypass graft  2006    SOCIAL HISTORY:   Social History  Substance Use Topics  . Smoking status: Current Every Day Smoker -- 0.50 packs/day for 50 years    Types: Cigarettes  . Smokeless tobacco: Never Used  . Alcohol Use: No    FAMILY HISTORY:   Family History  Problem Relation Age of Onset  . Hypertension Other   . Lung cancer Mother   . Heart attack Father     DRUG ALLERGIES:  No Known Allergies  REVIEW OF SYSTEMS:  REVIEW OF SYSTEMS:  CONSTITUTIONAL: Denies fevers, chills, fatigue,  weakness.  EYES: Denies blurred vision, double vision, or eye pain.  EARS, NOSE, THROAT: Denies tinnitus, ear pain, hearing loss.  RESPIRATORY: Positive cough, shortness of breath, denies wheezing  CARDIOVASCULAR: Denies chest pain, palpitations, edema.  GASTROINTESTINAL: Denies nausea, vomiting, diarrhea, abdominal pain.  GENITOURINARY: Denies dysuria, hematuria.  ENDOCRINE: Denies nocturia or thyroid problems. HEMATOLOGIC AND LYMPHATIC: Denies easy bruising or bleeding.  SKIN: Denies rash or lesions.  MUSCULOSKELETAL: Denies pain in neck, back, shoulder, knees, hips, or further arthritic symptoms.  NEUROLOGIC: Denies paralysis, paresthesias.  PSYCHIATRIC: Denies anxiety or depressive symptoms. Otherwise full review of systems performed by me is negative.   MEDICATIONS AT HOME:   Prior to Admission medications   Medication Sig Start Date End Date Taking? Authorizing Provider  albuterol (PROVENTIL HFA;VENTOLIN HFA) 108 (90 Base) MCG/ACT inhaler Inhale 2 puffs into the lungs every 6 (six) hours as needed for wheezing or shortness of breath.   Yes Historical Provider, MD  aspirin EC 81 MG tablet Take 81 mg by mouth daily.   Yes Historical Provider, MD  clopidogrel (PLAVIX) 75 MG tablet Take 75 mg by mouth daily.   Yes Historical Provider, MD  furosemide (LASIX) 40 MG tablet Take 40 mg by mouth 2 (two) times daily.   Yes Historical Provider, MD  gabapentin (NEURONTIN) 100 MG capsule Take 100 mg by mouth as needed.    Yes Historical Provider, MD  insulin glargine (LANTUS) 100 UNIT/ML injection Inject 50 Units into the skin  at bedtime.    Yes Historical Provider, MD  isosorbide mononitrate (IMDUR) 30 MG 24 hr tablet Take 30 mg by mouth daily.   Yes Historical Provider, MD  lisinopril (PRINIVIL,ZESTRIL) 10 MG tablet Take 10 mg by mouth daily.   Yes Historical Provider, MD  metFORMIN (GLUCOPHAGE) 1000 MG tablet Take 1,000 mg by mouth 2 (two) times daily with a meal.   Yes Historical Provider, MD   metoprolol (LOPRESSOR) 50 MG tablet Take 50 mg by mouth 2 (two) times daily.   Yes Historical Provider, MD  Multiple Vitamin (MULTIVITAMIN WITH MINERALS) TABS tablet Take 1 tablet by mouth daily.   Yes Historical Provider, MD  multivitamin-lutein (OCUVITE-LUTEIN) CAPS capsule Take 1 capsule by mouth daily.   Yes Historical Provider, MD  omeprazole (PRILOSEC) 20 MG capsule Take 20 mg by mouth daily.   Yes Historical Provider, MD  potassium chloride (K-DUR) 10 MEQ tablet Take 10 mEq by mouth daily.   Yes Historical Provider, MD  simvastatin (ZOCOR) 20 MG tablet Take 20 mg by mouth at bedtime.   Yes Historical Provider, MD  Fluticasone-Salmeterol (ADVAIR DISKUS) 250-50 MCG/DOSE AEPB Inhale 1 puff into the lungs 2 (two) times daily. 12/07/15   Dustin Flock, MD  sacubitril-valsartan (ENTRESTO) 24-26 MG Take 1 tablet by mouth 2 (two) times daily. 01/09/16   Alisa Graff, FNP      VITAL SIGNS:  Blood pressure 111/84, pulse 133, temperature 99.7 F (37.6 C), temperature source Oral, resp. rate 32, SpO2 90 %.  PHYSICAL EXAMINATION:  VITAL SIGNS: Filed Vitals:   01/17/16 1830 01/17/16 1900  BP: 132/85 111/84  Pulse: 128 133  Temp:    Resp: 28 32   GENERAL:69 y.o.male currently in no acute distress.  HEAD: Normocephalic, atraumatic.  EYES: Pupils equal, round, reactive to light. Extraocular muscles intact. No scleral icterus.  MOUTH: Dry mucosal membrane. Dentition intact. No abscess noted.  EAR, NOSE, THROAT: Clear without exudates. No external lesions.  NECK: Supple. No thyromegaly. No nodules. No JVD.  PULMONARY: Greatly diminished breath sounds without wheeze rails or rhonci. Tachypneic but No use of accessory muscles, poor respiratory effort. Poor air entry bilaterally CHEST: Nontender to palpation.  CARDIOVASCULAR: S1 and S2. Tachycardic. No murmurs, rubs, or gallops. No edema. Pedal pulses 2+ bilaterally.  GASTROINTESTINAL: Soft, nontender, nondistended. No masses. Positive bowel  sounds. No hepatosplenomegaly.  MUSCULOSKELETAL: No swelling, clubbing, or edema. Range of motion full in all extremities.  NEUROLOGIC: Cranial nerves II through XII are intact. No gross focal neurological deficits. Sensation intact. Reflexes intact.  SKIN: No ulceration, lesions, rashes, or cyanosis. Skin warm and dry. Turgor intact.  PSYCHIATRIC: Mood, affect within normal limits. The patient is awake, alert and oriented x 3. Insight, judgment intact.    LABORATORY PANEL:   CBC  Recent Labs Lab 01/17/16 1649  WBC 7.3  HGB 13.4  HCT 40.6  PLT 168   ------------------------------------------------------------------------------------------------------------------  Chemistries   Recent Labs Lab 01/15/16 2115 01/17/16 1649  NA 134* 133*  K 3.9 3.3*  CL 103 100*  CO2 22 18*  GLUCOSE 111* 296*  BUN 18 24*  CREATININE 1.66* 2.02*  CALCIUM 8.6* 8.5*  AST 22  --   ALT 14*  --   ALKPHOS 88  --   BILITOT 1.0  --    ------------------------------------------------------------------------------------------------------------------  Cardiac Enzymes  Recent Labs Lab 01/17/16 1649  TROPONINI 0.05*   ------------------------------------------------------------------------------------------------------------------  RADIOLOGY:  Dg Chest 2 View  01/17/2016  CLINICAL DATA:  Shortness of breath and cough  EXAM: CHEST  2 VIEW COMPARISON:  01/15/2016 FINDINGS: Cardiac shadow is stable. Postoperative changes are again seen. Mild hyperinflation is noted consistent with COPD. Chronic blunting of left costophrenic angle is again noted. Chronic interstitial changes are again seen. No focal infiltrate is noted. IMPRESSION: Chronic changes without acute abnormality. Electronically Signed   By: Inez Catalina M.D.   On: 01/17/2016 16:40   Dg Chest 2 View  01/15/2016  CLINICAL DATA:  Fever EXAM: CHEST  2 VIEW COMPARISON:  12/05/2015 FINDINGS: Stable borderline cardiomegaly. Negative aortic and  hilar contours. The patient is status post CABG. Chronic hyperinflation and interstitial coarsening attributed to COPD. There is chronic blunting of the left costophrenic sulci which is likely scarring given stable appearance over years. IMPRESSION: 1. No acute finding. 2. COPD and chronic left pleural scarring. Electronically Signed   By: Monte Fantasia M.D.   On: 01/15/2016 21:44    EKG:   Orders placed or performed during the hospital encounter of 01/17/16  . ED EKG  . ED EKG  . EKG 12-Lead  . EKG 12-Lead    IMPRESSION AND PLAN:   70 year old Caucasian gentleman history of COPD non-oxygen requiring presenting with shortness of breath.  1.Chronic obstructive pulmonary disease exacerbation: Provide DuoNeb treatments q. 4 hours, Solu-Medrol 60 mg IV q. daily, Continue with home medications.  2. Wide complex tachycardia: No further episodes thus far we'll admit to telemetry check magnesium level and replace if last 2 if continues to be recurrent we will start amiodarone 3. Type 2 diabetes insulin requiring: Continue basal insulin and hold oral agents at insulin sliding-scale 4. Essential hypertension: Lopressor, hold lisinopril 5. Acute kidney injury, likely prerenal: IV fluid hydration and follow urine output and renal function 6. Venous thromboembolism prophylactic: Lovenox     All the records are reviewed and case discussed with ED provider. Management plans discussed with the patient, family and they are in agreement.  CODE STATUS: Full  TOTAL TIME TAKING CARE OF THIS PATIENT: 33 minutes.    Hower,  Karenann Cai.D on 01/17/2016 at 8:08 PM  Between 7am to 6pm - Pager - [phone]  After 6pm: House Pager: - [phone]  [facility]  Office  [phone]  CC: Primary care physician; Marcello Fennel, MD

## 2016-01-17 NOTE — ED Notes (Signed)
Wife's Cell is [phone]

## 2016-01-18 ENCOUNTER — Other Ambulatory Visit: Payer: Self-pay | Admitting: *Deleted

## 2016-01-18 LAB — GLUCOSE, CAPILLARY
GLUCOSE-CAPILLARY: 469 mg/dL — AB (ref 65–99)
GLUCOSE-CAPILLARY: 254 mg/dL — AB (ref 65–99)
Glucose-Capillary: 217 mg/dL — ABNORMAL HIGH (ref 65–99)
Glucose-Capillary: 274 mg/dL — ABNORMAL HIGH (ref 65–99)
Glucose-Capillary: 174 mg/dL — ABNORMAL HIGH (ref 65–99)

## 2016-01-18 MED ORDER — OSELTAMIVIR PHOSPHATE 30 MG PO CAPS
30.0000 mg | ORAL_CAPSULE | Freq: Two times a day (BID) | ORAL | Status: DC
  Administered 2016-01-18 – 2016-01-20 (×5): 30 mg via ORAL
  Filled 2016-01-18 (×6): qty 1

## 2016-01-18 MED ORDER — INSULIN ASPART 100 UNIT/ML ~~LOC~~ SOLN
0.0000 [IU] | Freq: Three times a day (TID) | SUBCUTANEOUS | Status: DC
  Administered 2016-01-18 – 2016-01-19 (×2): 11 [IU] via SUBCUTANEOUS
  Administered 2016-01-19: 7 [IU] via SUBCUTANEOUS
  Administered 2016-01-19: 20 [IU] via SUBCUTANEOUS
  Filled 2016-01-18: qty 7
  Filled 2016-01-18 (×2): qty 11
  Filled 2016-01-18: qty 20

## 2016-01-18 MED ORDER — INSULIN ASPART 100 UNIT/ML ~~LOC~~ SOLN: 0.0000 [IU] | Freq: Every day | SUBCUTANEOUS | Status: DC

## 2016-01-18 NOTE — Progress Notes (Signed)
[facility] [place] NAME: [name]    MR#:  [id number]  DATE OF BIRTH:  Feb 19, 1946  SUBJECTIVE:  CHIEF COMPLAINT: pts sob is better. Reports some cough   REVIEW OF SYSTEMS:  CONSTITUTIONAL: No fever, fatigue or weakness.  EYES: No blurred or double vision.  EARS, NOSE, AND THROAT: No tinnitus or ear pain.  RESPIRATORY: Has cough, no shortness of breath, wheezing or hemoptysis.  CARDIOVASCULAR: No chest pain, orthopnea, edema.  GASTROINTESTINAL: No nausea, vomiting, diarrhea or abdominal pain.  GENITOURINARY: No dysuria, hematuria.  ENDOCRINE: No polyuria, nocturia,  HEMATOLOGY: No anemia, easy bruising or bleeding SKIN: No rash or lesion. MUSCULOSKELETAL: No joint pain or arthritis.   NEUROLOGIC: No tingling, numbness, weakness.  PSYCHIATRY: No anxiety or depression.   DRUG ALLERGIES:  No Known Allergies  VITALS:  Blood pressure 134/64, pulse 67, temperature 98.5 F (36.9 C), temperature source Oral, resp. rate 16, weight 73.347 kg (161 lb 11.2 oz), SpO2 94 %.  PHYSICAL EXAMINATION:  GENERAL:  70 y.o.-year-old patient lying in the bed with no acute distress.  EYES: Pupils equal, round, reactive to light and accommodation. No scleral icterus. Extraocular muscles intact.  HEENT: Head atraumatic, normocephalic. Oropharynx and nasopharynx clear.  NECK:  Supple, no jugular venous distention. No thyroid enlargement, no tenderness.  LUNGS: Moderate breath sounds bilaterally, no wheezing, rales,rhonchi or crepitation. No use of accessory muscles of respiration.  CARDIOVASCULAR: S1, S2 normal. No murmurs, rubs, or gallops.  ABDOMEN: Soft, nontender, nondistended. Bowel sounds present. No organomegaly or mass.  EXTREMITIES: No pedal edema, cyanosis, or clubbing.  NEUROLOGIC: Cranial nerves II through XII are intact. Muscle strength 5/5 in all extremities. Sensation intact. Gait not checked.  PSYCHIATRIC: The patient is alert and  oriented x 3.  SKIN: No obvious rash, lesion, or ulcer.    LABORATORY PANEL:   CBC  Recent Labs Lab 01/17/16 1649  WBC 7.3  HGB 13.4  HCT 40.6  PLT 168   ------------------------------------------------------------------------------------------------------------------  Chemistries   Recent Labs Lab 01/15/16 2115 01/17/16 1649  NA 134* 133*  K 3.9 3.3*  CL 103 100*  CO2 22 18*  GLUCOSE 111* 296*  BUN 18 24*  CREATININE 1.66* 2.02*  CALCIUM 8.6* 8.5*  MG  --  2.0  AST 22  --   ALT 14*  --   ALKPHOS 88  --   BILITOT 1.0  --    ------------------------------------------------------------------------------------------------------------------  Cardiac Enzymes  Recent Labs Lab 01/17/16 1649  TROPONINI 0.05*   ------------------------------------------------------------------------------------------------------------------  RADIOLOGY:  Dg Chest 2 View  01/17/2016  CLINICAL DATA:  Shortness of breath and cough EXAM: CHEST  2 VIEW COMPARISON:  01/15/2016 FINDINGS: Cardiac shadow is stable. Postoperative changes are again seen. Mild hyperinflation is noted consistent with COPD. Chronic blunting of left costophrenic angle is again noted. Chronic interstitial changes are again seen. No focal infiltrate is noted. IMPRESSION: Chronic changes without acute abnormality. Electronically Signed   By: Inez Catalina M.D.   On: 01/17/2016 16:40    EKG:   Orders placed or performed during the hospital encounter of 01/17/16  . ED EKG  . ED EKG  . EKG 12-Lead  . EKG 12-Lead    ASSESSMENT AND PLAN:   70 year old Caucasian gentleman history of COPD non-oxygen requiring presenting with shortness of breath.  #.Chronic obstructive pulmonary disease exacerbation:   DuoNeb treatments q. 4 hours, Solu-Medrol 60 mg IV q. daily, Continue with home medications.   # Influenza  Tamiflu  2. Wide complex tachycardia: Consult [facility] cardiologyc  No further episodes thus far we'll admit  to telemetry check magnesium level and replace if less than 2  if continues to be recurrent we will start amiodarone  # Type 2 diabetes insulin requiring: Continue basal insulin and hold oral agents at insulin sliding-scale  #. Essential hypertension: Lopressor, hold lisinopril  #. Acute kidney injury, likely prerenal:  IV fluid hydration and follow urine output and renal function  #. Venous thromboembolism prophylactic: Lovenox     All the records are reviewed and case discussed with Care Management/Social Workerr. Management plans discussed with the patient, family and they are in agreement.  CODE STATUS: FC  TOTAL TIME TAKING CARE OF THIS PATIENT: 71minutes.   POSSIBLE D/C IN 1-2 DAYS, DEPENDING ON CLINICAL CONDITION.   [name] M.D on 01/18/2016 at 10:53 PM  Between 7am to 6pm - Pager - [phone] After 6pm go to [email or web address] - password [email or web address] [facility]  Office  [phone]  CC: Primary care physician; BABAOFF, Caryl Bis, MD

## 2016-01-18 NOTE — Progress Notes (Signed)
Pt alert and oriented x4, no complaints of pain or discomfort.  Bed in low position, call bell within reach.  Bed alarms on and functioning.  Assessment done and charted.  Will continue to monitor and do hourly rounding throughout the shift 

## 2016-01-18 NOTE — Progress Notes (Signed)
RN notified by tele clerk that pt's heart rate dropped to 38 but did not sustain. Pt asymptomatic. Pt states " I feel fine". Pt's heartrate currently 58. MD Jannifer Franklin made aware. No new orders at this time. Will continue to monitor.   [name] M

## 2016-01-18 NOTE — Care Management (Addendum)
Patient admitted with hypoxia related to exac of copd.  He is independent in all adls, denies problems accessing medical care, obtaining meds or transportation.  Patient is not on home 02.  He has not qualified for home 02  when assessed today.  [facility] referral made and accepted.  has a rollator walker

## 2016-01-18 NOTE — Consult Note (Signed)
Reason for Consult: Shortness of breath coronary disease weakness Referring Physician: Dr. Valentino Nose hospitalist/Dr. Baldemar Lenis primary  [name] is an 70 y.o. male.  HPI: 70 year old male known history of coronary disease COPD diabetes hypertension CVA in the past with reflux patient complains of generalized weakness but denies any chest pain. He's had shortness of breath dyspnea on exertion. Patient has been hypoxic on arrival recommend supplemental oxygen was treated with steroids inhalers became slightly tachycardiac and may have had possibly a wide-complex rhythm. He was placed on amiodarone and admitted for further management. Patient denies palpitations or tachycardia  Past Medical History  Diagnosis Date  . COPD (chronic obstructive pulmonary disease) ([place])   . Coronary artery disease   . Diabetes mellitus without complication ([place])   . Hypertension   . Stroke ([place])   . CHF (congestive heart failure) ([place])   . GERD (gastroesophageal reflux disease)   . Pneumonia     Past Surgical History  Procedure Laterality Date  . Coronary artery bypass graft  2006    Family History  Problem Relation Age of Onset  . Hypertension Other   . Lung cancer Mother   . Heart attack Father     Social History:  reports that he has been smoking Cigarettes.  He has a 25 pack-year smoking history. He has never used smokeless tobacco. He reports that he does not drink alcohol. His drug history is not on file.  Allergies: No Known Allergies  Medications: I have reviewed the patient's current medications.  Results for orders placed or performed during the hospital encounter of 01/17/16 (from the past 48 hour(s))  CBC with Differential     Status: Abnormal   Collection Time: 01/17/16  4:49 PM  Result Value Ref Range   WBC 7.3 3.8 - 10.6 K/uL   RBC 4.64 4.40 - 5.90 MIL/uL   Hemoglobin 13.4 13.0 - 18.0 g/dL   HCT 40.6 40.0 - 52.0 %   MCV 87.5 80.0 - 100.0 fL   MCH 28.9 26.0 - 34.0 pg   MCHC  33.0 32.0 - 36.0 g/dL   RDW 15.7 (H) 11.5 - 14.5 %   Platelets 168 150 - 440 K/uL   Neutrophils Relative % 89 %   Neutro Abs 6.5 1.4 - 6.5 K/uL   Lymphocytes Relative 5 %   Lymphs Abs 0.4 (L) 1.0 - 3.6 K/uL   Monocytes Relative 6 %   Monocytes Absolute 0.4 0.2 - 1.0 K/uL   Eosinophils Relative 0 %   Eosinophils Absolute 0.0 0 - 0.7 K/uL   Basophils Relative 0 %   Basophils Absolute 0.0 0 - 0.1 K/uL  Basic metabolic panel     Status: Abnormal   Collection Time: 01/17/16  4:49 PM  Result Value Ref Range   Sodium 133 (L) 135 - 145 mmol/L   Potassium 3.3 (L) 3.5 - 5.1 mmol/L   Chloride 100 (L) 101 - 111 mmol/L   CO2 18 (L) 22 - 32 mmol/L   Glucose, Bld 296 (H) 65 - 99 mg/dL   BUN 24 (H) 6 - 20 mg/dL   Creatinine, Ser 2.02 (H) 0.61 - 1.24 mg/dL   Calcium 8.5 (L) 8.9 - 10.3 mg/dL   GFR calc non Af Amer 32 (L) >60 mL/min   GFR calc Af Amer 37 (L) >60 mL/min    Comment: (NOTE) The eGFR has been calculated using the CKD EPI equation. This calculation has not been validated in all clinical situations. eGFR's persistently <60 mL/min  signify possible Chronic Kidney Disease.    Anion gap 15 5 - 15  Troponin I     Status: Abnormal   Collection Time: 01/17/16  4:49 PM  Result Value Ref Range   Troponin I 0.05 (H) <0.031 ng/mL    Comment: READ BACK AND VERIFIED WITH DAVID WALKER AT 1854 01/17/2016 BY TFK        PERSISTENTLY INCREASED TROPONIN VALUES IN THE RANGE OF 0.04-0.49 ng/mL CAN BE SEEN IN:       -UNSTABLE ANGINA       -CONGESTIVE HEART FAILURE       -MYOCARDITIS       -CHEST TRAUMA       -ARRYHTHMIAS       -LATE PRESENTING MYOCARDIAL INFARCTION       -COPD   CLINICAL FOLLOW-UP RECOMMENDED.   Magnesium     Status: None   Collection Time: 01/17/16  4:49 PM  Result Value Ref Range   Magnesium 2.0 1.7 - 2.4 mg/dL  Rapid Influenza A&B Antigens ([facility] only)     Status: Abnormal   Collection Time: 01/17/16  6:59 PM  Result Value Ref Range   Influenza A ([facility]) POSITIVE (A)  NEGATIVE   Influenza B ([facility]) NEGATIVE NEGATIVE  Glucose, capillary     Status: Abnormal   Collection Time: 01/17/16 10:04 PM  Result Value Ref Range   Glucose-Capillary 528 (H) 65 - 99 mg/dL  Glucose, capillary     Status: Abnormal   Collection Time: 01/17/16 11:19 PM  Result Value Ref Range   Glucose-Capillary 469 (H) 65 - 99 mg/dL  Glucose, capillary     Status: Abnormal   Collection Time: 01/18/16  7:28 AM  Result Value Ref Range   Glucose-Capillary 254 (H) 65 - 99 mg/dL   Comment 1 Notify RN   Glucose, capillary     Status: Abnormal   Collection Time: 01/18/16 11:47 AM  Result Value Ref Range   Glucose-Capillary 217 (H) 65 - 99 mg/dL   Comment 1 Notify RN     Dg Chest 2 View  01/17/2016  CLINICAL DATA:  Shortness of breath and cough EXAM: CHEST  2 VIEW COMPARISON:  01/15/2016 FINDINGS: Cardiac shadow is stable. Postoperative changes are again seen. Mild hyperinflation is noted consistent with COPD. Chronic blunting of left costophrenic angle is again noted. Chronic interstitial changes are again seen. No focal infiltrate is noted. IMPRESSION: Chronic changes without acute abnormality. Electronically Signed   By: Inez Catalina M.D.   On: 01/17/2016 16:40    Review of Systems  Unable to perform ROS Constitutional: Positive for malaise/fatigue.  HENT: Positive for congestion.   Eyes: Negative.   Respiratory: Positive for shortness of breath.   Cardiovascular: Negative.   Gastrointestinal: Negative.   Genitourinary: Negative.   Musculoskeletal: Negative.   Skin: Negative.   Neurological: Positive for weakness.  Endo/Heme/Allergies: Negative.   Psychiatric/Behavioral: Negative.    Blood pressure 129/53, pulse 60, temperature 98.1 F (36.7 C), temperature source Oral, resp. rate 22, weight 73.347 kg (161 lb 11.2 oz), SpO2 93 %. Physical Exam  Nursing note and vitals reviewed. Constitutional: He is oriented to person, place, and time. He appears well-developed and  well-nourished.  HENT:  Head: Normocephalic and atraumatic.  Eyes: Conjunctivae and EOM are normal. Pupils are equal, round, and reactive to light.  Neck: Normal range of motion. Neck supple.  Cardiovascular: Normal rate and regular rhythm.  Exam reveals gallop and S4.   Respiratory: Breath sounds normal. He is  in respiratory distress.  GI: Soft. Bowel sounds are normal.  Musculoskeletal: Normal range of motion.  Neurological: He is alert and oriented to person, place, and time. He has normal reflexes.  Skin: Skin is warm and dry.  Psychiatric: He has a normal mood and affect.    Assessment/Plan: Shortness of breath Coronary artery disease Weakness COPD CVA by history Hypertension Hypoxemia Congestive heart failure systolic Diabetes GERD Wide-complex tachycardia possibly Max Sane . PLAN Rule out for myocardial infarction Follow-up telemetry EKG Inhalers for COPD Hypertension control with metoprolol /Entresto Diabetes type 2 uncomplicated Hyperlipidemia continue simvastatin therapy Angina continue Imdur metoprolol aspirin Plavix DVT prophylaxis Conservative medical therapy for now Continue supplemental oxygen    CALLWOOD,DWAYNE D. 01/18/2016, 12:29 PM

## 2016-01-18 NOTE — Consult Note (Signed)
   [facility] CM Inpatient Consult   01/18/2016  [name] 1946-04-27 [id number]   Referral received from inpatient case manager during progression rounds, diagnosis of HF, COPD, and DM for post hospital discharge follow up. Patient was evaluated for community based chronic disease management services with [facility] care Management Program as a benefit of patient's [employer] Medicare. Met with the patient at the bedside to explain [facility] Management services. Patient endorses his primary care provider to be Dr. [name]. Patient states he does not use 02 at home. Consent form signed. Patient states his best contact number is [phone] and gives permission to speak to his spouse [name] at [phone]. Patient will receive post hospital discharge calls and be evaluated for monthly home visits. [facility] Care Management services does not interfere with or replace any services arranged by the inpatient care management team. RNCM left contact information and [facility] literature at the bedside. Made inpatient RNCM aware that [facility] will be following for care management. For additional questions please contact:   Dereona Kolodny RN, [facility] Liaison  [phone]) Business Mobile [phone]) Toll free office

## 2016-01-19 LAB — BASIC METABOLIC PANEL
ANION GAP: 6 (ref 5–15)
BUN: 34 mg/dL — ABNORMAL HIGH (ref 6–20)
CALCIUM: 7.8 mg/dL — AB (ref 8.9–10.3)
CO2: 21 mmol/L — AB (ref 22–32)
Chloride: 105 mmol/L (ref 101–111)
Creatinine, Ser: 1.4 mg/dL — ABNORMAL HIGH (ref 0.61–1.24)
GFR, EST AFRICAN AMERICAN: 57 mL/min — AB (ref >60)
GFR, EST NON AFRICAN AMERICAN: 49 mL/min — AB (ref >60)
Glucose, Bld: 231 mg/dL — ABNORMAL HIGH (ref 65–99)
Potassium: 4.4 mmol/L (ref 3.5–5.1)
Sodium: 132 mmol/L — ABNORMAL LOW (ref 135–145)

## 2016-01-19 LAB — GLUCOSE, CAPILLARY
GLUCOSE-CAPILLARY: 252 mg/dL — AB (ref 65–99)
Glucose-Capillary: 248 mg/dL — ABNORMAL HIGH (ref 65–99)
Glucose-Capillary: 281 mg/dL — ABNORMAL HIGH (ref 65–99)
Glucose-Capillary: 356 mg/dL — ABNORMAL HIGH (ref 65–99)

## 2016-01-19 MED ORDER — HYDROCORTISONE 1 % EX CREA
TOPICAL_CREAM | Freq: Three times a day (TID) | CUTANEOUS | Status: DC
  Administered 2016-01-19 – 2016-01-20 (×2): via TOPICAL
  Filled 2016-01-19: qty 28

## 2016-01-19 MED ORDER — METHYLPREDNISOLONE SODIUM SUCC 40 MG IJ SOLR
40.0000 mg | Freq: Two times a day (BID) | INTRAMUSCULAR | Status: DC
  Administered 2016-01-19 (×2): 40 mg via INTRAVENOUS
  Filled 2016-01-19 (×2): qty 1

## 2016-01-19 NOTE — Progress Notes (Signed)
Patient is CHF and he's receiving NS at 75 mL/hour, Dr. Melynda Ripple informed if the fluid needs to continue infusing. New order received to stop it. Will continue to monitor.

## 2016-01-19 NOTE — Progress Notes (Signed)
[name] at [place] NAME: [name]    MR#:  [id number]  DATE OF BIRTH:  17-Jul-1946  SUBJECTIVE:  CHIEF COMPLAINT: pts sob is better. Feeling very weak. Wife and mother are sick with flulike symptoms  REVIEW OF SYSTEMS:  CONSTITUTIONAL: No fever, fatigue or weakness.  EYES: No blurred or double vision.  EARS, NOSE, AND THROAT: No tinnitus or ear pain.  RESPIRATORY: Has cough, no shortness of breath, wheezing or hemoptysis.  CARDIOVASCULAR: No chest pain, orthopnea, edema.  GASTROINTESTINAL: No nausea, vomiting, diarrhea or abdominal pain.  GENITOURINARY: No dysuria, hematuria.  ENDOCRINE: No polyuria, nocturia,  HEMATOLOGY: No anemia, easy bruising or bleeding SKIN: No rash or lesion. MUSCULOSKELETAL: No joint pain or arthritis.   NEUROLOGIC: No tingling, numbness, weakness.  PSYCHIATRY: No anxiety or depression.   DRUG ALLERGIES:  No Known Allergies  VITALS:  Blood pressure 145/58, pulse 59, temperature 97.5 F (36.4 C), temperature source Oral, resp. rate 17, weight 73.347 kg (161 lb 11.2 oz), SpO2 95 %.  PHYSICAL EXAMINATION:  GENERAL:  70 y.o.-year-old patient lying in the bed with no acute distress.  EYES: Pupils equal, round, reactive to light and accommodation. No scleral icterus. Extraocular muscles intact.  HEENT: Head atraumatic, normocephalic. Oropharynx and nasopharynx clear.  NECK:  Supple, no jugular venous distention. No thyroid enlargement, no tenderness.  LUNGS: Moderate breath sounds bilaterally, no wheezing, rales,rhonchi or crepitation. No use of accessory muscles of respiration.  CARDIOVASCULAR: S1, S2 normal. No murmurs, rubs, or gallops.  ABDOMEN: Soft, nontender, nondistended. Bowel sounds present. No organomegaly or mass.  EXTREMITIES: No pedal edema, cyanosis, or clubbing.  NEUROLOGIC: Cranial nerves II through XII are intact. Muscle strength 5/5 in all extremities. Sensation intact. Gait not  checked.  PSYCHIATRIC: The patient is alert and oriented x 3.  SKIN: No obvious rash, lesion, or ulcer.    LABORATORY PANEL:   CBC  Recent Labs Lab 01/17/16 1649  WBC 7.3  HGB 13.4  HCT 40.6  PLT 168   ------------------------------------------------------------------------------------------------------------------  Chemistries   Recent Labs Lab 01/15/16 2115 01/17/16 1649 01/19/16 0436  NA 134* 133* 132*  K 3.9 3.3* 4.4  CL 103 100* 105  CO2 22 18* 21*  GLUCOSE 111* 296* 231*  BUN 18 24* 34*  CREATININE 1.66* 2.02* 1.40*  CALCIUM 8.6* 8.5* 7.8*  MG  --  2.0  --   AST 22  --   --   ALT 14*  --   --   ALKPHOS 88  --   --   BILITOT 1.0  --   --    ------------------------------------------------------------------------------------------------------------------  Cardiac Enzymes  Recent Labs Lab 01/17/16 1649  TROPONINI 0.05*   ------------------------------------------------------------------------------------------------------------------  RADIOLOGY:  Dg Chest 2 View  01/17/2016  CLINICAL DATA:  Shortness of breath and cough EXAM: CHEST  2 VIEW COMPARISON:  01/15/2016 FINDINGS: Cardiac shadow is stable. Postoperative changes are again seen. Mild hyperinflation is noted consistent with COPD. Chronic blunting of left costophrenic angle is again noted. Chronic interstitial changes are again seen. No focal infiltrate is noted. IMPRESSION: Chronic changes without acute abnormality. Electronically Signed   By: Inez Catalina M.D.   On: 01/17/2016 16:40    EKG:   Orders placed or performed during the hospital encounter of 01/17/16  . ED EKG  . ED EKG  . EKG 12-Lead  . EKG 12-Lead    ASSESSMENT AND PLAN:   70 year old Caucasian gentleman history of COPD non-oxygen requiring presenting  with shortness of breath.  #.Chronic obstructive pulmonary disease exacerbation:   DuoNeb treatments q. 4 hours, taper  Solu-Medrol 60 mg IV q. daily to 40 mg IV every 12 hours,  Continue with home medications.   continue doxycycline  # Influenza  Tamiflu  2. Wide complex tachycardia: Consult [facility] cardiology, Dr. Clayborn Bigness thinks is most likely an aberrancy  No further episodes thus far we'll admit to telemetry check magnesium level and replace if less than 2  No other cardiac interventions are needed at this time  # Type 2 diabetes insulin requiring: Continue basal insulin and hold oral agents at insulin sliding-scale  #. Essential hypertension: Lopressor, hold lisinopril  #. Acute kidney injury, likely prerenal:  IV fluid hydration and follow urine output and renal function Creatinine 2.0 to-1.4  #Generalized weakness PT consult is placed  #. Venous thromboembolism prophylactic: Lovenox     All the records are reviewed and case discussed with Care Management/Social Workerr. Management plans discussed with the patient, family and they are in agreement.  CODE STATUS: FC  TOTAL TIME TAKING CARE OF THIS PATIENT: 7minutes.   POSSIBLE D/C IN a.m. DAYS, DEPENDING ON CLINICAL CONDITION.   [name] M.D on 01/19/2016 at 11:18 AM  Between 7am to 6pm - Pager - [phone] After 6pm go to [email or web address] - password [email or web address] [facility]  Office  [phone]  CC: Primary care physician; BABAOFF, Caryl Bis, MD

## 2016-01-19 NOTE — Evaluation (Signed)
Physical Therapy Evaluation Patient Details Name: [name] MRN: [id number] DOB: [birth year] Today's Date: 01/19/2016   History of Present Illness  Patient is a pleasant 70 y/o male admitted with COPD exacerbation with concurrent treatment of the flu. Patient has a history of CVA and CABGx3.   Clinical Impression  Patient with recent COPD exacerbation and treatment of flu, leading to some dyspnea on exertion. No dyspnea noted during ambulation in this session, his gait speed and balance appear at roughly his baseline. Patient reports he will begin ambulating with RW after this session given balance concerns and history of falls, no loss of balance observed in this session. Of note patient did begin urinating while ambulating, it appears this is residual from previous CVA as patient reports he has struggled with this for years -- RN notified. This appears to be his baseline otherwise, though given multiple falls in the past patient would benefit from additional skilled PT services after discharge for safety with mobility.     Follow Up Recommendations Home health PT    Equipment Recommendations       Recommendations for Other Services       Precautions / Restrictions Precautions Precautions: Fall Restrictions Weight Bearing Restrictions: No      Mobility  Bed Mobility Overal bed mobility: Needs Assistance Bed Mobility: Supine to Sit     Supine to sit: Supervision     General bed mobility comments: Patient does not require cuing or assistance, however decreased speed noted with transfer.   Transfers Overall transfer level: Needs assistance Equipment used: Rolling walker (2 wheeled) Transfers: Sit to/from Stand Sit to Stand: Supervision         General transfer comment: Patient demonstrates no loss of balance with transfer, appropriate hand placement.   Ambulation/Gait Ambulation/Gait assistance: Supervision Ambulation Distance (Feet): 40 Feet Assistive device: Rolling  walker (2 wheeled) Gait Pattern/deviations: Decreased step length - right;Decreased step length - left;Step-through pattern;Trunk flexed   Gait velocity interpretation: Below normal speed for age/gender General Gait Details: Patient ambulates with decreased step length bilaterally, no loss of balance noted though trunk flexed with RW.   Stairs            Wheelchair Mobility    Modified Rankin (Stroke Patients Only)       Balance Overall balance assessment: Needs assistance Sitting-balance support: No upper extremity supported Sitting balance-Leahy Scale: Good       Standing balance-Leahy Scale: Fair Standing balance comment: Decreased stride length with ambulation, no loss of balance noted.                              Pertinent Vitals/Pain Pain Assessment:  (Patient asks for preparation H, PT informed RN.)    Home Living Family/patient expects to be discharged to:: Private residence Living Arrangements: Spouse/significant other Available Help at Discharge: Family   Home Access: Stairs to enter   [employer] of Steps: 3-4 steps to enter with railing.   Home Equipment: [place] - 2 wheels;Cane - single point      Prior Function Level of Independence: Independent         Comments: Patient reports he has been ambulating with devices off and on. Reports a history of multiple falls as a result of CVA suffered several years previous.      Hand Dominance        Extremity/Trunk Assessment   Upper Extremity Assessment: Overall WFL for tasks assessed  Lower Extremity Assessment: Overall WFL for tasks assessed         Communication   Communication: No difficulties  Cognition Arousal/Alertness: Awake/alert Behavior During Therapy: WFL for tasks assessed/performed Overall Cognitive Status: Within Functional Limits for tasks assessed                      General Comments      Exercises         Assessment/Plan    PT Assessment Patient needs continued PT services  PT Diagnosis Difficulty walking;Generalized weakness   PT Problem List Decreased strength;Decreased activity tolerance;Cardiopulmonary status limiting activity;Decreased balance;Decreased mobility  PT Treatment Interventions Gait training;Stair training;Therapeutic activities;Therapeutic exercise;Balance training   PT Goals (Current goals can be found in the Care Plan section) Acute Rehab PT Goals Patient Stated Goal: To return home  PT Goal Formulation: With patient Time For Goal Achievement: 02/02/16 Potential to Achieve Goals: Good    Frequency Min 2X/week   Barriers to discharge        Co-evaluation               End of Session Equipment Utilized During Treatment: Gait belt Activity Tolerance: Patient tolerated treatment well Patient left: in chair;with call bell/phone within reach;with chair alarm set Nurse Communication: Mobility status (Request for preparation H)         Time: [id number] PT Time Calculation (min) (ACUTE ONLY): 24 min   Charges:   PT Evaluation $PT Eval Moderate Complexity: 1 Procedure PT Treatments $Therapeutic Activity: 8-22 mins (Assited patient to commode, changed linens.)   PT G Codes:       Kerman Passey, PT, DPT    01/19/2016, 4:35 PM

## 2016-01-19 NOTE — Plan of Care (Signed)
Problem: Safety: Goal: Ability to remain free from injury will improve Outcome: Progressing Fall precautions in place  Problem: Pain Managment: Goal: General experience of comfort will improve Outcome: Progressing Prn medications  Problem: Physical Regulation: Goal: Ability to maintain clinical measurements within normal limits will improve Outcome: Not Progressing Physical therapy consult pending Goal: Will remain free from infection Outcome: Progressing On Tamiflu  Problem: Tissue Perfusion: Goal: Risk factors for ineffective tissue perfusion will decrease Outcome: Progressing SQ Lovenox

## 2016-01-20 LAB — BASIC METABOLIC PANEL
ANION GAP: 3 — AB (ref 5–15)
BUN: 32 mg/dL — ABNORMAL HIGH (ref 6–20)
CHLORIDE: 106 mmol/L (ref 101–111)
CO2: 24 mmol/L (ref 22–32)
Calcium: 8 mg/dL — ABNORMAL LOW (ref 8.9–10.3)
Creatinine, Ser: 1.37 mg/dL — ABNORMAL HIGH (ref 0.61–1.24)
GFR calc non Af Amer: 51 mL/min — ABNORMAL LOW (ref >60)
GFR, EST AFRICAN AMERICAN: 59 mL/min — AB (ref >60)
Glucose, Bld: 87 mg/dL (ref 65–99)
POTASSIUM: 4.1 mmol/L (ref 3.5–5.1)
SODIUM: 133 mmol/L — AB (ref 135–145)

## 2016-01-20 LAB — GLUCOSE, CAPILLARY
GLUCOSE-CAPILLARY: 91 mg/dL (ref 65–99)
GLUCOSE-CAPILLARY: 232 mg/dL — AB (ref 65–99)

## 2016-01-20 MED ORDER — HYDROCORTISONE 1 % EX CREA: TOPICAL_CREAM | Freq: Three times a day (TID) | CUTANEOUS | Status: DC

## 2016-01-20 MED ORDER — DOXYCYCLINE HYCLATE 50 MG PO CAPS: 100.0000 mg | ORAL_CAPSULE | Freq: Two times a day (BID) | ORAL | Status: AC

## 2016-01-20 MED ORDER — OSELTAMIVIR PHOSPHATE 30 MG PO CAPS: 30.0000 mg | ORAL_CAPSULE | Freq: Two times a day (BID) | ORAL | Status: AC

## 2016-01-20 MED ORDER — ACETAMINOPHEN 325 MG PO TABS: 650.0000 mg | ORAL_TABLET | Freq: Four times a day (QID) | ORAL | Status: AC | PRN

## 2016-01-20 MED ORDER — GUAIFENESIN-CODEINE 100-10 MG/5ML PO SOLN: 10.0000 mL | Freq: Four times a day (QID) | ORAL | Status: DC | PRN

## 2016-01-20 MED ORDER — OXYCODONE HCL 5 MG PO TABS: 5.0000 mg | ORAL_TABLET | ORAL | Status: DC | PRN

## 2016-01-20 MED ORDER — PREDNISONE 10 MG (21) PO TBPK: 10.0000 mg | ORAL_TABLET | Freq: Every day | ORAL | Status: DC

## 2016-01-20 NOTE — Discharge Instructions (Addendum)
Chronic Obstructive Pulmonary Disease Chronic obstructive pulmonary disease (COPD) is a common lung condition in which airflow from the lungs is limited. COPD is a general term that can be used to describe many different lung problems that limit airflow, including both chronic bronchitis and emphysema. If you have COPD, your lung function will probably never return to normal, but there are measures you can take to improve lung function and make yourself feel better. CAUSES   Smoking (common).  Exposure to secondhand smoke.  Genetic problems.  Chronic inflammatory lung diseases or recurrent infections. SYMPTOMS  Shortness of breath, especially with physical activity.  Deep, persistent (chronic) cough with a large amount of thick mucus.  Wheezing.  Rapid breaths (tachypnea).  Gray or bluish discoloration (cyanosis) of the skin, especially in your fingers, toes, or lips.  Fatigue.  Weight loss.  Frequent infections or episodes when breathing symptoms become much worse (exacerbations).  Chest tightness. DIAGNOSIS Your health care provider will take a medical history and perform a physical examination to diagnose COPD. Additional tests for COPD may include:  Lung (pulmonary) function tests.  Chest X-ray.  CT scan.  Blood tests. TREATMENT  Treatment for COPD may include:  Inhaler and nebulizer medicines. These help manage the symptoms of COPD and make your breathing more comfortable.  Supplemental oxygen. Supplemental oxygen is only helpful if you have a low oxygen level in your blood.  Exercise and physical activity. These are beneficial for nearly all people with COPD.  Lung surgery or transplant.  Nutrition therapy to gain weight, if you are underweight.  Pulmonary rehabilitation. This may involve working with a team of health care providers and specialists, such as respiratory, occupational, and physical therapists. HOME CARE INSTRUCTIONS  Take all medicines  (inhaled or pills) as directed by your health care provider.  Avoid over-the-counter medicines or cough syrups that dry up your airway (such as antihistamines) and slow down the elimination of secretions unless instructed otherwise by your health care provider.  If you are a smoker, the most important thing that you can do is stop smoking. Continuing to smoke will cause further lung damage and breathing trouble. Ask your health care provider for help with quitting smoking. He or she can direct you to community resources or hospitals that provide support.  Avoid exposure to irritants such as smoke, chemicals, and fumes that aggravate your breathing.  Use oxygen therapy and pulmonary rehabilitation if directed by your health care provider. If you require home oxygen therapy, ask your health care provider whether you should purchase a pulse oximeter to measure your oxygen level at home.  Avoid contact with individuals who have a contagious illness.  Avoid extreme temperature and humidity changes.  Eat healthy foods. Eating smaller, more frequent meals and resting before meals may help you maintain your strength.  Stay active, but balance activity with periods of rest. Exercise and physical activity will help you maintain your ability to do things you want to do.  Preventing infection and hospitalization is very important when you have COPD. Make sure to receive all the vaccines your health care provider recommends, especially the pneumococcal and influenza vaccines. Ask your health care provider whether you need a pneumonia vaccine.  Learn and use relaxation techniques to manage stress.  Learn and use controlled breathing techniques as directed by your health care provider. Controlled breathing techniques include:  Pursed lip breathing. Start by breathing in (inhaling) through your nose for 1 second. Then, purse your lips as if you were  going to whistle and breathe out (exhale) through the  pursed lips for 2 seconds.  Diaphragmatic breathing. Start by putting one hand on your abdomen just above your waist. Inhale slowly through your nose. The hand on your abdomen should move out. Then purse your lips and exhale slowly. You should be able to feel the hand on your abdomen moving in as you exhale.  Learn and use controlled coughing to clear mucus from your lungs. Controlled coughing is a series of short, progressive coughs. The steps of controlled coughing are: 1. Lean your head slightly forward. 2. Breathe in deeply using diaphragmatic breathing. 3. Try to hold your breath for 3 seconds. 4. Keep your mouth slightly open while coughing twice. 5. Spit any mucus out into a tissue. 6. Rest and repeat the steps once or twice as needed. SEEK MEDICAL CARE IF:  You are coughing up more mucus than usual.  There is a change in the color or thickness of your mucus.  Your breathing is more labored than usual.  Your breathing is faster than usual. SEEK IMMEDIATE MEDICAL CARE IF:  You have shortness of breath while you are resting.  You have shortness of breath that prevents you from:  Being able to talk.  Performing your usual physical activities.  You have chest pain lasting longer than 5 minutes.  Your skin color is more cyanotic than usual.  You measure low oxygen saturations for longer than 5 minutes with a pulse oximeter. MAKE SURE YOU:  Understand these instructions.  Will watch your condition.  Will get help right away if you are not doing well or get worse.   This information is not intended to replace advice given to you by your health care provider. Make sure you discuss any questions you have with your health care provider.   Document Released: 07/30/2005 Document Revised: 11/10/2014 Document Reviewed: 06/16/2013 Elsevier Interactive Patient Education 2016 Else[employer] Inc[employer] Influenza, Adult Influenza ("the flu") is a viral infection of the respiratory tract. It  occurs more often in winter months because people spend more time in close contact with one another. Influenza can make you feel very sick. Influenza easily spreads from person to person (contagious). CAUSES  Influenza is caused by a virus that infects the respiratory tract. You can catch the virus by breathing in droplets from an infected person's cough or sneeze. You can also catch the virus by touching something that was recently contaminated with the virus and then touching your mouth, nose, or eyes. RISKS AND COMPLICATIONS You may be at risk for a more severe case of influenza if you smoke cigarettes, have diabetes, have chronic heart disease (such as heart failure) or lung disease (such as asthma), or if you have a weakened immune system. Elderly people and pregnant women are also at risk for more serious infections. The most common problem of influenza is a lung infection (pneumonia). Sometimes, this problem can require emergency medical care and may be life threatening. SIGNS AND SYMPTOMS  Symptoms typically last 4 to 10 days and may include:  Fever.  Chills.  Headache, body aches, and muscle aches.  Sore throat.  Chest discomfort and cough.  Poor appetite.  Weakness or feeling tired.  Dizziness.  Nausea or vomiting. DIAGNOSIS  Diagnosis of influenza is often made based on your history and a physical exam. A nose or throat swab test can be done to confirm the diagnosis. TREATMENT  In mild cases, influenza goes away on its own. Treatment is directed  at relieving symptoms. For more severe cases, your health care provider may prescribe antiviral medicines to shorten the sickness. Antibiotic medicines are not effective because the infection is caused by a virus, not by bacteria. HOME CARE INSTRUCTIONS  Take medicines only as directed by your health care provider.  Use a cool mist humidifier to make breathing easier.  Get plenty of rest until your temperature returns to normal.  This usually takes 3 to 4 days.  Drink enough fluid to keep your urine clear or pale yellow.  Cover yourmouth and nosewhen coughing or sneezing,and wash your handswellto prevent thevirusfrom spreading.  Stay homefromwork orschool untilthe fever is gonefor at least 57full day. PREVENTION  An annual influenza vaccination (flu shot) is the best way to avoid getting influenza. An annual flu shot is now routinely recommended for all adults in the [place] IF:  You experiencechest pain, yourcough worsens,or you producemore mucus.  Youhave nausea,vomiting, ordiarrhea.  Your fever returns or gets worse. SEEK IMMEDIATE MEDICAL CARE IF:  You havetrouble breathing, you become short of breath,or your skin ornails becomebluish.  You have severe painor stiffnessin the neck.  You develop a sudden headache, or pain in the face or ear.  You have nausea or vomiting that you cannot control. MAKE SURE YOU:   Understand these instructions.  Will watch your condition.  Will get help right away if you are not doing well or get worse.   This information is not intended to replace advice given to you by your health care provider. Make sure you discuss any questions you have with your health care provider.   Document Released: 10/17/2000 Document Revised: 11/10/2014 Document Reviewed: 01/19/2012 Elsevier Interactive Patient Education 2016 [employer].   Activity as tolerated, continue home health PT Diabetic diet Follow-up with primary care physician in a week

## 2016-01-20 NOTE — Progress Notes (Signed)
Patient discharged via wheelchair and private vehicle. IV removed and catheter intact. All discharge instructions given and patient verbalizes understanding. Tele removed and returned. Prescriptions given to patient No distress noted.   

## 2016-01-20 NOTE — Care Management Note (Signed)
Case Management Note  Patient Details  Name: Kuron Garth MRN: [id number] Date of Birth: [birth year]  Subjective/Objective:   Discussed discharge planning with Mr Klebe who chose [facility] to be his home health provider. A referral was faxed to [facility] [place].                 Action/Plan:   Expected Discharge Date:                  Expected Discharge Plan:     In-House Referral:     Discharge planning Services     Post Acute Care Choice:    Choice offered to:     DME Arranged:    DME Agency:     HH Arranged:    [place] Agency:     Status of Service:     Medicare Important Message Given:    Date Medicare IM Given:    Medicare IM give by:    Date Additional Medicare IM Given:    Additional Medicare Important Message give by:     If discussed at [place] of Stay Meetings, dates discussed:    Additional Comments:  Sherre Wooton A, RN 01/20/2016, 12:03 PM

## 2016-01-20 NOTE — Discharge Summary (Signed)
[facility] [place] NAME: [name]    MR#:  [id number]  DATE OF BIRTH:  04-30-1946  DATE OF ADMISSION:  01/17/2016 ADMITTING PHYSICIAN: Lytle Butte, MD  DATE OF DISCHARGE: 01/20/16 PRIMARY CARE PHYSICIAN: BABAOFF, Caryl Bis, MD    ADMISSION DIAGNOSIS:  COPD exacerbation ([name]) [J44.1]  DISCHARGE DIAGNOSIS:  Active Problems:   COPD exacerbation ([place])  influenza  SECONDARY DIAGNOSIS:   Past Medical History  Diagnosis Date  . COPD (chronic obstructive pulmonary disease) ([place])   . Coronary artery disease   . Diabetes mellitus without complication ([place])   . Hypertension   . Stroke ([place])   . CHF (congestive heart failure) ([place])   . GERD (gastroesophageal reflux disease)   . Pneumonia     HOSPITAL COURSE:   70 year old Caucasian gentleman history of COPD non-oxygen requiring presenting with shortness of breath.  #.Chronic obstructive pulmonary disease exacerbation:  Clinically improved  DuoNeb treatments q. 4 hours, taper Solu-Medrol to by mouth prednisone Continue with home medications.  continue doxycycline  # Influenza  Tamiflu  2. Wide complex tachycardia: Consult [facility] cardiology, Dr. Clayborn Bigness thinks is most likely an aberrancy No further episodes thus far we'll admit to telemetry check magnesium level and replace if less than 2  No other cardiac interventions are needed at this time  # Type 2 diabetes insulin requiring: Continue basal insulin and hold oral agents at insulin sliding-scale  #. Essential hypertension: Lopressor, hold lisinopril  #. Acute kidney injury, likely prerenal:  IV fluid hydration and follow urine output and renal function Creatinine 2.0 to-1.4--1.37   #Generalized weakness PT consult -recommending home health PT   #. Venous thromboembolism prophylactic: Lovenox  DISCHARGE CONDITIONS:   fair  CONSULTS OBTAINED:  Treatment Team:  Lytle Butte, MD Yolonda Kida,  MD   PROCEDURES none  DRUG ALLERGIES:  No Known Allergies  DISCHARGE MEDICATIONS:   Current Discharge Medication List    START taking these medications   Details  acetaminophen (TYLENOL) 325 MG tablet Take 2 tablets (650 mg total) by mouth every 6 (six) hours as needed for mild pain (or Fever >/= 101).    doxycycline (VIBRAMYCIN) 50 MG capsule Take 2 capsules (100 mg total) by mouth 2 (two) times daily. Qty: 10 capsule, Refills: 0    guaiFENesin-codeine 100-10 MG/5ML syrup Take 10 mLs by mouth every 6 (six) hours as needed for cough. Qty: 120 mL, Refills: 0    hydrocortisone cream 1 % Apply topically 3 (three) times daily. Qty: 30 g, Refills: 0    oseltamivir (TAMIFLU) 30 MG capsule Take 1 capsule (30 mg total) by mouth 2 (two) times daily. Qty: 6 capsule, Refills: 0    oxyCODONE (OXY IR/ROXICODONE) 5 MG immediate release tablet Take 1 tablet (5 mg total) by mouth every 4 (four) hours as needed for moderate pain. Qty: 30 tablet, Refills: 0    predniSONE (STERAPRED UNI-PAK 21 TAB) 10 MG (21) TBPK tablet Take 1 tablet (10 mg total) by mouth daily. Take 6 tablets by mouth for 1 day followed by  5 tablets by mouth for 1 day followed by  4 tablets by mouth for 1 day followed by  3 tablets by mouth for 1 day followed by  2 tablets by mouth for 1 day followed by  1 tablet by mouth for a day and stop Qty: 21 tablet, Refills: 0      CONTINUE these medications which have NOT CHANGED  Details  albuterol (PROVENTIL HFA;VENTOLIN HFA) 108 (90 Base) MCG/ACT inhaler Inhale 2 puffs into the lungs every 6 (six) hours as needed for wheezing or shortness of breath.    aspirin EC 81 MG tablet Take 81 mg by mouth daily.    clopidogrel (PLAVIX) 75 MG tablet Take 75 mg by mouth daily.    furosemide (LASIX) 40 MG tablet Take 40 mg by mouth 2 (two) times daily.    gabapentin (NEURONTIN) 100 MG capsule Take 100 mg by mouth as needed.     insulin glargine (LANTUS) 100 UNIT/ML injection  Inject 50 Units into the skin at bedtime.     isosorbide mononitrate (IMDUR) 30 MG 24 hr tablet Take 30 mg by mouth daily.    lisinopril (PRINIVIL,ZESTRIL) 10 MG tablet Take 10 mg by mouth daily.    metFORMIN (GLUCOPHAGE) 1000 MG tablet Take 1,000 mg by mouth 2 (two) times daily with a meal.    metoprolol (LOPRESSOR) 50 MG tablet Take 50 mg by mouth 2 (two) times daily.    Multiple Vitamin (MULTIVITAMIN WITH MINERALS) TABS tablet Take 1 tablet by mouth daily.    multivitamin-lutein (OCUVITE-LUTEIN) CAPS capsule Take 1 capsule by mouth daily.    omeprazole (PRILOSEC) 20 MG capsule Take 20 mg by mouth daily.    potassium chloride (K-DUR) 10 MEQ tablet Take 10 mEq by mouth daily.    simvastatin (ZOCOR) 20 MG tablet Take 20 mg by mouth at bedtime.    Fluticasone-Salmeterol (ADVAIR DISKUS) 250-50 MCG/DOSE AEPB Inhale 1 puff into the lungs 2 (two) times daily. Qty: 60 each, Refills: 0    sacubitril-valsartan (ENTRESTO) 24-26 MG Take 1 tablet by mouth 2 (two) times daily. Qty: 60 tablet, Refills: 3         DISCHARGE INSTRUCTIONS:    Activity as tolerated, continue home health PT Diabetic diet Follow-up with primary care physician in a week   DIET:  Diabetic diet  DISCHARGE CONDITION:  Fair  ACTIVITY:  Activity as tolerated, home PT  OXYGEN:  Home Oxygen: No.   Oxygen Delivery: room air  DISCHARGE LOCATION:  home   If you experience worsening of your admission symptoms, develop shortness of breath, life threatening emergency, suicidal or homicidal thoughts you must seek medical attention immediately by calling 911 or calling your MD immediately  if symptoms less severe.  You Must read complete instructions/literature along with all the possible adverse reactions/side effects for all the Medicines you take and that have been prescribed to you. Take any new Medicines after you have completely understood and accpet all the possible adverse reactions/side effects.    Please note  You were cared for by a hospitalist during your hospital stay. If you have any questions about your discharge medications or the care you received while you were in the hospital after you are discharged, you can call the unit and asked to speak with the hospitalist on call if the hospitalist that took care of you is not available. Once you are discharged, your primary care physician will handle any further medical issues. Please note that NO REFILLS for any discharge medications will be authorized once you are discharged, as it is imperative that you return to your primary care physician (or establish a relationship with a primary care physician if you do not have one) for your aftercare needs so that they can reassess your need for medications and monitor your lab values.     Today  Chief Complaint  Patient presents with  .  Shortness of Breath   Patient is feeling much better. Cough is improving. Shortness of breath is better. Wants to go home  ROS:  CONSTITUTIONAL: Denies fevers, chills. Denies any fatigue, weakness.  EYES: Denies blurry vision, double vision, eye pain. EARS, NOSE, THROAT: Denies tinnitus, ear pain, hearing loss. RESPIRATORY: Improving cough, denies wheeze, shortness of breath.  CARDIOVASCULAR: Denies chest pain, palpitations, edema.  GASTROINTESTINAL: Denies nausea, vomiting, diarrhea, abdominal pain. Denies bright red blood per rectum. GENITOURINARY: Denies dysuria, hematuria. ENDOCRINE: Denies nocturia or thyroid problems. HEMATOLOGIC AND LYMPHATIC: Denies easy bruising or bleeding. SKIN: Denies rash or lesion. MUSCULOSKELETAL: Denies pain in neck, back, shoulder, knees, hips or arthritic symptoms.  NEUROLOGIC: Denies paralysis, paresthesias.  PSYCHIATRIC: Denies anxiety or depressive symptoms.   VITAL SIGNS:  Blood pressure 133/59, pulse 54, temperature 97.5 F (36.4 C), temperature source Oral, resp. rate 18, height 5\' 6"  (1.676 m), weight  73.347 kg (161 lb 11.2 oz), SpO2 90 %.  I/O:    Intake/Output Summary (Last 24 hours) at 01/20/16 1256 Last data filed at 01/20/16 1029  Gross per 24 hour  Intake    480 ml  Output   1200 ml  Net   -720 ml    PHYSICAL EXAMINATION:  GENERAL:  69 y.o.-year-old patient lying in the bed with no acute distress.  EYES: Pupils equal, round, reactive to light and accommodation. No scleral icterus. Extraocular muscles intact.  HEENT: Head atraumatic, normocephalic. Oropharynx and nasopharynx clear.  NECK:  Supple, no jugular venous distention. No thyroid enlargement, no tenderness.  LUNGS: Normal breath sounds bilaterally, no wheezing, rales,rhonchi or crepitation. No use of accessory muscles of respiration.  CARDIOVASCULAR: S1, S2 normal. No murmurs, rubs, or gallops.  ABDOMEN: Soft, non-tender, non-distended. Bowel sounds present. No organomegaly or mass.  EXTREMITIES: No pedal edema, cyanosis, or clubbing.  NEUROLOGIC: Cranial nerves II through XII are intact. Muscle strength 5/5 in all extremities. Sensation intact. Gait not checked.  PSYCHIATRIC: The patient is alert and oriented x 3.  SKIN: No obvious rash, lesion, or ulcer.   DATA REVIEW:   CBC  Recent Labs Lab 01/17/16 1649  WBC 7.3  HGB 13.4  HCT 40.6  PLT 168    Chemistries   Recent Labs Lab 01/15/16 2115 01/17/16 1649  01/20/16 0435  NA 134* 133*  < > 133*  K 3.9 3.3*  < > 4.1  CL 103 100*  < > 106  CO2 22 18*  < > 24  GLUCOSE 111* 296*  < > 87  BUN 18 24*  < > 32*  CREATININE 1.66* 2.02*  < > 1.37*  CALCIUM 8.6* 8.5*  < > 8.0*  MG  --  2.0  --   --   AST 22  --   --   --   ALT 14*  --   --   --   ALKPHOS 88  --   --   --   BILITOT 1.0  --   --   --   < > = values in this interval not displayed.  Cardiac Enzymes  Recent Labs Lab 01/17/16 1649  TROPONINI 0.05*    Microbiology Results  Results for orders placed or performed during the hospital encounter of 01/17/16  Rapid Influenza A&B Antigens  [facility] only)     Status: Abnormal   Collection Time: 01/17/16  6:59 PM  Result Value Ref Range Status   Influenza A ([facility]) POSITIVE (A) NEGATIVE Final   Influenza B ([facility]) NEGATIVE NEGATIVE Final  RADIOLOGY:  Dg Chest 2 View  01/17/2016  CLINICAL DATA:  Shortness of breath and cough EXAM: CHEST  2 VIEW COMPARISON:  01/15/2016 FINDINGS: Cardiac shadow is stable. Postoperative changes are again seen. Mild hyperinflation is noted consistent with COPD. Chronic blunting of left costophrenic angle is again noted. Chronic interstitial changes are again seen. No focal infiltrate is noted. IMPRESSION: Chronic changes without acute abnormality. Electronically Signed   By: Inez Catalina M.D.   On: 01/17/2016 16:40    EKG:   Orders placed or performed during the hospital encounter of 01/17/16  . ED EKG  . ED EKG  . EKG 12-Lead  . EKG 12-Lead      Management plans discussed with the patient, family and they are in agreement.  CODE STATUS:     Code Status Orders        Start     Ordered   01/17/16 1927  Full code   Continuous     01/17/16 1926    Code Status History    Date Active Date Inactive Code Status Order ID Comments User Context   12/03/2015  5:30 PM 12/07/2015  9:03 PM Full Code [id number]  Lytle Butte, MD ED      TOTAL TIME TAKING CARE OF THIS PATIENT: 45  minutes.    @[facility] @  on 01/20/2016 at 12:56 PM  Between 7am to 6pm - Pager - [phone]  After 6pm go to [email or web address] - password [email or web address] [facility]  Office  [phone]  CC: Primary care physician; BABAOFF, Caryl Bis, MD

## 2016-01-20 NOTE — Progress Notes (Signed)
A&O. Up with one assist. IV antibiotics. IV solu-medrol. Nebs. On RA. No complaints.

## 2016-01-21 ENCOUNTER — Other Ambulatory Visit: Payer: Self-pay | Admitting: *Deleted

## 2016-01-21 LAB — CULTURE, BLOOD (ROUTINE X 2): Culture: NO GROWTH

## 2016-01-21 NOTE — Patient Outreach (Signed)
Attempt made to contact pt for transition of care (discharged 3/19).  HIPPA compliant voice message left with contact number.  If no response, will try again.    Zara Chess.   [facility]  [phone]

## 2016-01-23 ENCOUNTER — Other Ambulatory Visit: Payer: Self-pay | Admitting: *Deleted

## 2016-01-23 NOTE — Patient Outreach (Signed)
Second attempt to contact pt for transition of care, discharged 3/19.  HIPPA compliant voice message left with contact number.  If no response, will try again.     Zara Chess.   [facility]  [phone]

## 2016-01-23 NOTE — Progress Notes (Signed)
[facility]  Patient Status: Discharged, unable to locate patient  [facility] is providing the following services:   If patient discharges after hours, please call [phone].   [name] 01/23/2016, 10:30 AM

## 2016-01-23 NOTE — Care Management (Signed)
Notified by Corene Cornea with [facility] that they were not able to reach patient to start home health care. I contacted and spoke with patient at [phone]. He still agrees to home health services. I have notified [place] to please contact at this number today- patient waiting for call.

## 2016-01-24 DIAGNOSIS — I2510 Atherosclerotic heart disease of native coronary artery without angina pectoris: Secondary | ICD-10-CM | POA: Diagnosis not present

## 2016-01-24 DIAGNOSIS — J111 Influenza due to unidentified influenza virus with other respiratory manifestations: Secondary | ICD-10-CM | POA: Diagnosis not present

## 2016-01-24 DIAGNOSIS — Z8673 Personal history of transient ischemic attack (TIA), and cerebral infarction without residual deficits: Secondary | ICD-10-CM | POA: Diagnosis not present

## 2016-01-24 DIAGNOSIS — I509 Heart failure, unspecified: Secondary | ICD-10-CM | POA: Diagnosis not present

## 2016-01-24 DIAGNOSIS — E119 Type 2 diabetes mellitus without complications: Secondary | ICD-10-CM | POA: Diagnosis not present

## 2016-01-24 DIAGNOSIS — J441 Chronic obstructive pulmonary disease with (acute) exacerbation: Secondary | ICD-10-CM | POA: Diagnosis not present

## 2016-01-24 DIAGNOSIS — I10 Essential (primary) hypertension: Secondary | ICD-10-CM | POA: Diagnosis not present

## 2016-01-24 DIAGNOSIS — Z951 Presence of aortocoronary bypass graft: Secondary | ICD-10-CM | POA: Diagnosis not present

## 2016-01-24 DIAGNOSIS — K219 Gastro-esophageal reflux disease without esophagitis: Secondary | ICD-10-CM | POA: Diagnosis not present

## 2016-01-25 DIAGNOSIS — J441 Chronic obstructive pulmonary disease with (acute) exacerbation: Secondary | ICD-10-CM | POA: Diagnosis not present

## 2016-01-25 DIAGNOSIS — R269 Unspecified abnormalities of gait and mobility: Secondary | ICD-10-CM | POA: Diagnosis not present

## 2016-01-25 DIAGNOSIS — R5381 Other malaise: Secondary | ICD-10-CM | POA: Diagnosis not present

## 2016-01-31 ENCOUNTER — Other Ambulatory Visit: Payer: Self-pay | Admitting: *Deleted

## 2016-01-31 ENCOUNTER — Ambulatory Visit: Payer: Self-pay | Admitting: *Deleted

## 2016-01-31 NOTE — Patient Outreach (Signed)
Third attempt made to contact pt for transition of care, HIPPA compliant voice message left.  If no response, plan to send an unable to contact letter and if no response to letter in 10 days, will  close case, let MD know unable to contact.     Zara Chess.   [facility]  [phone]

## 2016-02-01 ENCOUNTER — Encounter: Payer: Self-pay | Admitting: *Deleted

## 2016-02-01 ENCOUNTER — Other Ambulatory Visit: Payer: Self-pay | Admitting: *Deleted

## 2016-02-11 ENCOUNTER — Telehealth: Payer: Self-pay | Admitting: Family

## 2016-02-11 ENCOUNTER — Ambulatory Visit: Payer: PPO | Admitting: Family

## 2016-02-11 NOTE — Telephone Encounter (Signed)
Patient did not show for his [facility] Failure [facility] appointment on 02/11/16. Will attempt to reschedule.

## 2016-02-15 ENCOUNTER — Other Ambulatory Visit: Payer: Self-pay | Admitting: *Deleted

## 2016-02-15 ENCOUNTER — Encounter: Payer: Self-pay | Admitting: *Deleted

## 2016-04-08 DIAGNOSIS — I739 Peripheral vascular disease, unspecified: Secondary | ICD-10-CM | POA: Diagnosis not present

## 2016-04-08 DIAGNOSIS — E782 Mixed hyperlipidemia: Secondary | ICD-10-CM | POA: Diagnosis not present

## 2016-04-08 DIAGNOSIS — I5022 Chronic systolic (congestive) heart failure: Secondary | ICD-10-CM | POA: Diagnosis not present

## 2016-04-08 DIAGNOSIS — I10 Essential (primary) hypertension: Secondary | ICD-10-CM | POA: Diagnosis not present

## 2016-04-08 DIAGNOSIS — I25810 Atherosclerosis of coronary artery bypass graft(s) without angina pectoris: Secondary | ICD-10-CM | POA: Diagnosis not present

## 2016-06-10 DIAGNOSIS — N183 Chronic kidney disease, stage 3 (moderate): Secondary | ICD-10-CM | POA: Diagnosis not present

## 2016-06-10 DIAGNOSIS — E1165 Type 2 diabetes mellitus with hyperglycemia: Secondary | ICD-10-CM | POA: Diagnosis not present

## 2016-06-10 DIAGNOSIS — Z125 Encounter for screening for malignant neoplasm of prostate: Secondary | ICD-10-CM | POA: Diagnosis not present

## 2016-06-10 DIAGNOSIS — E1122 Type 2 diabetes mellitus with diabetic chronic kidney disease: Secondary | ICD-10-CM | POA: Diagnosis not present

## 2016-06-10 DIAGNOSIS — Z79899 Other long term (current) drug therapy: Secondary | ICD-10-CM | POA: Diagnosis not present

## 2016-06-16 DIAGNOSIS — N183 Chronic kidney disease, stage 3 (moderate): Secondary | ICD-10-CM | POA: Diagnosis not present

## 2016-06-16 DIAGNOSIS — R972 Elevated prostate specific antigen [PSA]: Secondary | ICD-10-CM | POA: Diagnosis not present

## 2016-06-16 DIAGNOSIS — I2510 Atherosclerotic heart disease of native coronary artery without angina pectoris: Secondary | ICD-10-CM | POA: Diagnosis not present

## 2016-06-16 DIAGNOSIS — E1122 Type 2 diabetes mellitus with diabetic chronic kidney disease: Secondary | ICD-10-CM | POA: Diagnosis not present

## 2016-06-16 DIAGNOSIS — Z794 Long term (current) use of insulin: Secondary | ICD-10-CM | POA: Diagnosis not present

## 2016-06-16 DIAGNOSIS — E7800 Pure hypercholesterolemia, unspecified: Secondary | ICD-10-CM | POA: Diagnosis not present

## 2016-06-16 DIAGNOSIS — N400 Enlarged prostate without lower urinary tract symptoms: Secondary | ICD-10-CM | POA: Diagnosis not present

## 2016-06-16 DIAGNOSIS — I10 Essential (primary) hypertension: Secondary | ICD-10-CM | POA: Diagnosis not present

## 2016-06-16 DIAGNOSIS — Z79899 Other long term (current) drug therapy: Secondary | ICD-10-CM | POA: Diagnosis not present

## 2016-07-31 ENCOUNTER — Other Ambulatory Visit: Payer: Self-pay | Admitting: Family Medicine

## 2016-07-31 DIAGNOSIS — R413 Other amnesia: Secondary | ICD-10-CM | POA: Diagnosis not present

## 2016-07-31 DIAGNOSIS — R269 Unspecified abnormalities of gait and mobility: Secondary | ICD-10-CM | POA: Diagnosis not present

## 2016-07-31 DIAGNOSIS — Z23 Encounter for immunization: Secondary | ICD-10-CM | POA: Diagnosis not present

## 2016-07-31 DIAGNOSIS — R112 Nausea with vomiting, unspecified: Secondary | ICD-10-CM | POA: Diagnosis not present

## 2016-08-08 ENCOUNTER — Ambulatory Visit
Admission: RE | Admit: 2016-08-08 | Discharge: 2016-08-08 | Disposition: A | Discharge status: Home / Self Care | Payer: PPO | Source: Ambulatory Visit | Attending: Family Medicine | Admitting: Family Medicine

## 2016-08-08 DIAGNOSIS — R112 Nausea with vomiting, unspecified: Secondary | ICD-10-CM | POA: Insufficient documentation

## 2016-08-08 DIAGNOSIS — I639 Cerebral infarction, unspecified: Secondary | ICD-10-CM | POA: Diagnosis not present

## 2016-08-08 DIAGNOSIS — R413 Other amnesia: Secondary | ICD-10-CM

## 2016-08-08 DIAGNOSIS — R269 Unspecified abnormalities of gait and mobility: Secondary | ICD-10-CM | POA: Diagnosis not present

## 2016-08-08 DIAGNOSIS — J328 Other chronic sinusitis: Secondary | ICD-10-CM | POA: Insufficient documentation

## 2016-08-08 MED ORDER — GADOBENATE DIMEGLUMINE 529 MG/ML IV SOLN
7.0000 mL | Freq: Once | INTRAVENOUS | Status: AC | PRN
  Administered 2016-08-08: 7 mL via INTRAVENOUS

## 2016-08-11 DIAGNOSIS — N401 Enlarged prostate with lower urinary tract symptoms: Secondary | ICD-10-CM | POA: Diagnosis not present

## 2016-08-11 DIAGNOSIS — R972 Elevated prostate specific antigen [PSA]: Secondary | ICD-10-CM | POA: Diagnosis not present

## 2016-08-11 DIAGNOSIS — R3915 Urgency of urination: Secondary | ICD-10-CM | POA: Diagnosis not present

## 2016-10-02 ENCOUNTER — Observation Stay (HOSPITAL_COMMUNITY)
Admission: EM | Admit: 2016-10-02 | Discharge: 2016-10-03 | Disposition: A | Discharge status: Home / Self Care | Payer: PPO | Attending: General Surgery | Admitting: General Surgery

## 2016-10-02 ENCOUNTER — Encounter (HOSPITAL_COMMUNITY)
Admission: EM | Disposition: A | Discharge status: Home / Self Care | Payer: Self-pay | Source: Home / Self Care | Attending: Emergency Medicine

## 2016-10-02 ENCOUNTER — Emergency Department (HOSPITAL_COMMUNITY): Payer: PPO | Admitting: Registered Nurse

## 2016-10-02 ENCOUNTER — Encounter (HOSPITAL_COMMUNITY): Payer: Self-pay | Admitting: Emergency Medicine

## 2016-10-02 DIAGNOSIS — E119 Type 2 diabetes mellitus without complications: Secondary | ICD-10-CM | POA: Insufficient documentation

## 2016-10-02 DIAGNOSIS — I110 Hypertensive heart disease with heart failure: Secondary | ICD-10-CM | POA: Diagnosis not present

## 2016-10-02 DIAGNOSIS — N9961 Intraoperative hemorrhage and hematoma of a genitourinary system organ or structure complicating a genitourinary system procedure: Principal | ICD-10-CM | POA: Insufficient documentation

## 2016-10-02 DIAGNOSIS — C61 Malignant neoplasm of prostate: Secondary | ICD-10-CM | POA: Diagnosis not present

## 2016-10-02 DIAGNOSIS — T8131XA Disruption of external operation (surgical) wound, not elsewhere classified, initial encounter: Secondary | ICD-10-CM | POA: Diagnosis not present

## 2016-10-02 DIAGNOSIS — K625 Hemorrhage of anus and rectum: Secondary | ICD-10-CM | POA: Diagnosis present

## 2016-10-02 DIAGNOSIS — J449 Chronic obstructive pulmonary disease, unspecified: Secondary | ICD-10-CM | POA: Diagnosis not present

## 2016-10-02 DIAGNOSIS — Z794 Long term (current) use of insulin: Secondary | ICD-10-CM | POA: Diagnosis not present

## 2016-10-02 DIAGNOSIS — Y733 Surgical instruments, materials and gastroenterology and urology devices (including sutures) associated with adverse incidents: Secondary | ICD-10-CM | POA: Insufficient documentation

## 2016-10-02 DIAGNOSIS — Z8673 Personal history of transient ischemic attack (TIA), and cerebral infarction without residual deficits: Secondary | ICD-10-CM | POA: Insufficient documentation

## 2016-10-02 DIAGNOSIS — I2510 Atherosclerotic heart disease of native coronary artery without angina pectoris: Secondary | ICD-10-CM | POA: Diagnosis not present

## 2016-10-02 DIAGNOSIS — Z8249 Family history of ischemic heart disease and other diseases of the circulatory system: Secondary | ICD-10-CM | POA: Diagnosis not present

## 2016-10-02 DIAGNOSIS — Z801 Family history of malignant neoplasm of trachea, bronchus and lung: Secondary | ICD-10-CM | POA: Diagnosis not present

## 2016-10-02 DIAGNOSIS — Y848 Other medical procedures as the cause of abnormal reaction of the patient, or of later complication, without mention of misadventure at the time of the procedure: Secondary | ICD-10-CM | POA: Diagnosis not present

## 2016-10-02 DIAGNOSIS — Z951 Presence of aortocoronary bypass graft: Secondary | ICD-10-CM | POA: Diagnosis not present

## 2016-10-02 DIAGNOSIS — E785 Hyperlipidemia, unspecified: Secondary | ICD-10-CM | POA: Insufficient documentation

## 2016-10-02 DIAGNOSIS — Z9889 Other specified postprocedural states: Secondary | ICD-10-CM | POA: Diagnosis not present

## 2016-10-02 DIAGNOSIS — I5022 Chronic systolic (congestive) heart failure: Secondary | ICD-10-CM | POA: Diagnosis not present

## 2016-10-02 DIAGNOSIS — K219 Gastro-esophageal reflux disease without esophagitis: Secondary | ICD-10-CM | POA: Insufficient documentation

## 2016-10-02 DIAGNOSIS — F17210 Nicotine dependence, cigarettes, uncomplicated: Secondary | ICD-10-CM | POA: Insufficient documentation

## 2016-10-02 DIAGNOSIS — T8189XA Other complications of procedures, not elsewhere classified, initial encounter: Secondary | ICD-10-CM | POA: Diagnosis not present

## 2016-10-02 DIAGNOSIS — Z7982 Long term (current) use of aspirin: Secondary | ICD-10-CM | POA: Insufficient documentation

## 2016-10-02 DIAGNOSIS — I10 Essential (primary) hypertension: Secondary | ICD-10-CM | POA: Diagnosis not present

## 2016-10-02 DIAGNOSIS — T819XXA Unspecified complication of procedure, initial encounter: Secondary | ICD-10-CM

## 2016-10-02 DIAGNOSIS — Z7902 Long term (current) use of antithrombotics/antiplatelets: Secondary | ICD-10-CM | POA: Insufficient documentation

## 2016-10-02 DIAGNOSIS — R972 Elevated prostate specific antigen [PSA]: Secondary | ICD-10-CM | POA: Diagnosis not present

## 2016-10-02 HISTORY — PX: HEMORRHOID SURGERY: SHX153

## 2016-10-02 LAB — BASIC METABOLIC PANEL
Anion gap: 5 (ref 5–15)
BUN: 21 mg/dL — ABNORMAL HIGH (ref 6–20)
CALCIUM: 9.1 mg/dL (ref 8.9–10.3)
CHLORIDE: 108 mmol/L (ref 101–111)
CO2: 27 mmol/L (ref 22–32)
CREATININE: 1.72 mg/dL — AB (ref 0.61–1.24)
GFR calc Af Amer: 44 mL/min — ABNORMAL LOW (ref >60)
GFR calc non Af Amer: 38 mL/min — ABNORMAL LOW (ref >60)
GLUCOSE: 109 mg/dL — AB (ref 65–99)
Potassium: 4.9 mmol/L (ref 3.5–5.1)
Sodium: 140 mmol/L (ref 135–145)

## 2016-10-02 LAB — ABO/RH: ABO/RH(D): O POS

## 2016-10-02 LAB — CBC WITH DIFFERENTIAL/PLATELET
BASOS PCT: 0 %
Basophils Absolute: 0 10*3/uL (ref 0.0–0.1)
EOS ABS: 0.1 10*3/uL (ref 0.0–0.7)
Eosinophils Relative: 1 %
HEMATOCRIT: 47.7 % (ref 39.0–52.0)
HEMOGLOBIN: 16.1 g/dL (ref 13.0–17.0)
LYMPHS ABS: 2.4 10*3/uL (ref 0.7–4.0)
Lymphocytes Relative: 20 %
MCH: 30.8 pg (ref 26.0–34.0)
MCHC: 33.8 g/dL (ref 30.0–36.0)
MCV: 91.2 fL (ref 78.0–100.0)
MONO ABS: 1 10*3/uL (ref 0.1–1.0)
MONOS PCT: 8 %
Neutro Abs: 8.4 10*3/uL — ABNORMAL HIGH (ref 1.7–7.7)
Neutrophils Relative %: 71 %
Platelets: 249 10*3/uL (ref 150–400)
RBC: 5.23 MIL/uL (ref 4.22–5.81)
RDW: 14.2 % (ref 11.5–15.5)
WBC: 12 10*3/uL — ABNORMAL HIGH (ref 4.0–10.5)

## 2016-10-02 LAB — TYPE AND SCREEN
ABO/RH(D): O POS
Antibody Screen: NEGATIVE

## 2016-10-02 LAB — GLUCOSE, CAPILLARY
GLUCOSE-CAPILLARY: 125 mg/dL — AB (ref 65–99)
Glucose-Capillary: 85 mg/dL (ref 65–99)

## 2016-10-02 LAB — I-STAT CHEM 8, ED
BUN: 22 mg/dL — AB (ref 6–20)
CALCIUM ION: 1.17 mmol/L (ref 1.15–1.40)
CREATININE: 1.7 mg/dL — AB (ref 0.61–1.24)
Chloride: 104 mmol/L (ref 101–111)
Glucose, Bld: 103 mg/dL — ABNORMAL HIGH (ref 65–99)
HEMATOCRIT: 49 % (ref 39.0–52.0)
HEMOGLOBIN: 16.7 g/dL (ref 13.0–17.0)
Potassium: 4.9 mmol/L (ref 3.5–5.1)
SODIUM: 140 mmol/L (ref 135–145)
TCO2: 26 mmol/L (ref 0–100)

## 2016-10-02 LAB — CBG MONITORING, ED: GLUCOSE-CAPILLARY: 111 mg/dL — AB (ref 65–99)

## 2016-10-02 LAB — PROTIME-INR
INR: 0.98
Prothrombin Time: 13 seconds (ref 11.4–15.2)

## 2016-10-02 SURGERY — HEMORRHOIDECTOMY
Anesthesia: General | Site: Anus

## 2016-10-02 MED ORDER — SODIUM CHLORIDE 0.9 % IV SOLN
INTRAVENOUS | Status: DC | PRN
  Administered 2016-10-02: 17:00:00 via INTRAVENOUS

## 2016-10-02 MED ORDER — CEFAZOLIN SODIUM-DEXTROSE 2-4 GM/100ML-% IV SOLN
2.0000 g | INTRAVENOUS | Status: AC
  Administered 2016-10-02: 2 g via INTRAVENOUS

## 2016-10-02 MED ORDER — HYDROCODONE-ACETAMINOPHEN 5-325 MG PO TABS: 1.0000 | ORAL_TABLET | ORAL | Status: DC | PRN

## 2016-10-02 MED ORDER — BUPIVACAINE HCL (PF) 0.5 % IJ SOLN
INTRAMUSCULAR | Status: DC | PRN
  Administered 2016-10-02: 20 mL

## 2016-10-02 MED ORDER — FENTANYL CITRATE (PF) 100 MCG/2ML IJ SOLN
INTRAMUSCULAR | Status: DC | PRN
  Administered 2016-10-02: 50 ug via INTRAVENOUS
  Administered 2016-10-02: 25 ug via INTRAVENOUS

## 2016-10-02 MED ORDER — PROPOFOL 10 MG/ML IV BOLUS
INTRAVENOUS | Status: DC | PRN
  Administered 2016-10-02: 100 mg via INTRAVENOUS

## 2016-10-02 MED ORDER — IPRATROPIUM-ALBUTEROL 0.5-2.5 (3) MG/3ML IN SOLN
3.0000 mL | Freq: Four times a day (QID) | RESPIRATORY_TRACT | Status: DC | PRN
  Filled 2016-10-02: qty 3

## 2016-10-02 MED ORDER — SODIUM CHLORIDE 0.9 % IV SOLN: INTRAVENOUS | Status: DC

## 2016-10-02 MED ORDER — SUCCINYLCHOLINE CHLORIDE 200 MG/10ML IV SOSY
PREFILLED_SYRINGE | INTRAVENOUS | Status: DC | PRN
  Administered 2016-10-02: 100 mg via INTRAVENOUS

## 2016-10-02 MED ORDER — FENTANYL CITRATE (PF) 100 MCG/2ML IJ SOLN
INTRAMUSCULAR | Status: AC
  Filled 2016-10-02: qty 2

## 2016-10-02 MED ORDER — HYDRALAZINE HCL 20 MG/ML IJ SOLN: 2.0000 mg | Freq: Four times a day (QID) | INTRAMUSCULAR | Status: DC | PRN

## 2016-10-02 MED ORDER — NICOTINE 14 MG/24HR TD PT24
14.0000 mg | MEDICATED_PATCH | Freq: Every day | TRANSDERMAL | Status: DC
  Filled 2016-10-02: qty 1

## 2016-10-02 MED ORDER — ONDANSETRON 4 MG PO TBDP: 4.0000 mg | ORAL_TABLET | Freq: Four times a day (QID) | ORAL | Status: DC | PRN

## 2016-10-02 MED ORDER — LIDOCAINE HCL (CARDIAC) 20 MG/ML IV SOLN
INTRAVENOUS | Status: DC | PRN
  Administered 2016-10-02: 100 mg via INTRAVENOUS

## 2016-10-02 MED ORDER — MOMETASONE FURO-FORMOTEROL FUM 200-5 MCG/ACT IN AERO
2.0000 | INHALATION_SPRAY | Freq: Two times a day (BID) | RESPIRATORY_TRACT | Status: DC
  Administered 2016-10-03: 2 via RESPIRATORY_TRACT
  Filled 2016-10-02 (×2): qty 8.8

## 2016-10-02 MED ORDER — ONDANSETRON HCL 4 MG/2ML IJ SOLN
INTRAMUSCULAR | Status: AC
  Filled 2016-10-02: qty 2

## 2016-10-02 MED ORDER — ALBUTEROL SULFATE HFA 108 (90 BASE) MCG/ACT IN AERS: 2.0000 | INHALATION_SPRAY | Freq: Four times a day (QID) | RESPIRATORY_TRACT | Status: DC | PRN

## 2016-10-02 MED ORDER — 0.9 % SODIUM CHLORIDE (POUR BTL) OPTIME
TOPICAL | Status: DC | PRN
  Administered 2016-10-02: 1000 mL

## 2016-10-02 MED ORDER — CEFAZOLIN SODIUM-DEXTROSE 2-4 GM/100ML-% IV SOLN
INTRAVENOUS | Status: AC
  Filled 2016-10-02: qty 100

## 2016-10-02 MED ORDER — SUGAMMADEX SODIUM 200 MG/2ML IV SOLN
INTRAVENOUS | Status: AC
Start: 2016-10-02 — End: 2016-10-02
  Filled 2016-10-02: qty 2

## 2016-10-02 MED ORDER — SIMVASTATIN 20 MG PO TABS
20.0000 mg | ORAL_TABLET | Freq: Every day | ORAL | Status: DC
  Administered 2016-10-02: 20 mg via ORAL
  Filled 2016-10-02 (×3): qty 1

## 2016-10-02 MED ORDER — PROPOFOL 10 MG/ML IV BOLUS
INTRAVENOUS | Status: AC
  Filled 2016-10-02: qty 20

## 2016-10-02 MED ORDER — SUCCINYLCHOLINE CHLORIDE 200 MG/10ML IV SOSY
PREFILLED_SYRINGE | INTRAVENOUS | Status: AC
  Filled 2016-10-02: qty 10

## 2016-10-02 MED ORDER — INSULIN ASPART 100 UNIT/ML ~~LOC~~ SOLN
0.0000 [IU] | SUBCUTANEOUS | Status: DC
  Administered 2016-10-03: 2 [IU] via SUBCUTANEOUS
  Administered 2016-10-03 (×2): 1 [IU] via SUBCUTANEOUS

## 2016-10-02 MED ORDER — ONDANSETRON HCL 4 MG/2ML IJ SOLN
INTRAMUSCULAR | Status: DC | PRN
Start: 2016-10-02 — End: 2016-10-02
  Administered 2016-10-02: 4 mg via INTRAVENOUS

## 2016-10-02 MED ORDER — SODIUM CHLORIDE 0.9 % IV SOLN
Freq: Once | INTRAVENOUS | Status: AC
  Administered 2016-10-02: 17:00:00 via INTRAVENOUS

## 2016-10-02 MED ORDER — ROCURONIUM BROMIDE 10 MG/ML (PF) SYRINGE
PREFILLED_SYRINGE | INTRAVENOUS | Status: DC | PRN
  Administered 2016-10-02: 20 mg via INTRAVENOUS

## 2016-10-02 MED ORDER — METOPROLOL TARTRATE 5 MG/5ML IV SOLN
INTRAVENOUS | Status: DC | PRN
  Administered 2016-10-02 (×2): 1 mg via INTRAVENOUS

## 2016-10-02 MED ORDER — METOPROLOL TARTRATE 5 MG/5ML IV SOLN: 2.5000 mg | Freq: Four times a day (QID) | INTRAVENOUS | Status: DC | PRN

## 2016-10-02 MED ORDER — PANTOPRAZOLE SODIUM 40 MG PO TBEC
40.0000 mg | DELAYED_RELEASE_TABLET | Freq: Every day | ORAL | Status: DC | PRN
  Filled 2016-10-02: qty 1

## 2016-10-02 MED ORDER — SODIUM CHLORIDE 0.45 % IV SOLN
INTRAVENOUS | Status: DC
  Administered 2016-10-02: 19:00:00 via INTRAVENOUS

## 2016-10-02 MED ORDER — METOPROLOL TARTRATE 5 MG/5ML IV SOLN
INTRAVENOUS | Status: AC
  Filled 2016-10-02: qty 5

## 2016-10-02 MED ORDER — LIDOCAINE 2% (20 MG/ML) 5 ML SYRINGE
INTRAMUSCULAR | Status: AC
  Filled 2016-10-02: qty 5

## 2016-10-02 MED ORDER — BUPIVACAINE HCL (PF) 0.5 % IJ SOLN
INTRAMUSCULAR | Status: AC
  Filled 2016-10-02: qty 30

## 2016-10-02 MED ORDER — DIPHENHYDRAMINE HCL 12.5 MG/5ML PO ELIX: 12.5000 mg | ORAL_SOLUTION | Freq: Four times a day (QID) | ORAL | Status: DC | PRN

## 2016-10-02 MED ORDER — DIPHENHYDRAMINE HCL 50 MG/ML IJ SOLN: 12.5000 mg | Freq: Four times a day (QID) | INTRAMUSCULAR | Status: DC | PRN

## 2016-10-02 MED ORDER — SUGAMMADEX SODIUM 200 MG/2ML IV SOLN
INTRAVENOUS | Status: DC | PRN
  Administered 2016-10-02: 150 mg via INTRAVENOUS

## 2016-10-02 MED ORDER — ACETAMINOPHEN 325 MG PO TABS: 650.0000 mg | ORAL_TABLET | Freq: Four times a day (QID) | ORAL | Status: DC | PRN

## 2016-10-02 MED ORDER — ONDANSETRON HCL 4 MG/2ML IJ SOLN: 4.0000 mg | Freq: Four times a day (QID) | INTRAMUSCULAR | Status: DC | PRN

## 2016-10-02 MED ORDER — ROCURONIUM BROMIDE 50 MG/5ML IV SOSY
PREFILLED_SYRINGE | INTRAVENOUS | Status: AC
  Filled 2016-10-02: qty 5

## 2016-10-02 SURGICAL SUPPLY — 31 items
BLADE HEX COATED 2.75 (ELECTRODE) IMPLANT
BLADE SURG 15 STRL LF DISP TIS (BLADE) IMPLANT
BLADE SURG 15 STRL SS (BLADE)
BLADE SURG SZ10 CARB STEEL (BLADE) IMPLANT
COVER SURGICAL LIGHT HANDLE (MISCELLANEOUS) ×2 IMPLANT
DECANTER SPIKE VIAL GLASS SM (MISCELLANEOUS) IMPLANT
DRAPE LAPAROTOMY TRNSV 102X78 (DRAPE) ×2 IMPLANT
DRSG PAD ABDOMINAL 8X10 ST (GAUZE/BANDAGES/DRESSINGS) ×2 IMPLANT
ELECT PENCIL ROCKER SW 15FT (MISCELLANEOUS) ×2 IMPLANT
ELECT REM PT RETURN 9FT ADLT (ELECTROSURGICAL) ×2
ELECTRODE REM PT RTRN 9FT ADLT (ELECTROSURGICAL) ×1 IMPLANT
GAUZE SPONGE 4X4 12PLY STRL (GAUZE/BANDAGES/DRESSINGS) ×2 IMPLANT
GAUZE SPONGE 4X4 16PLY XRAY LF (GAUZE/BANDAGES/DRESSINGS) ×2 IMPLANT
GOWN STRL REUS W/TWL LRG LVL3 (GOWN DISPOSABLE) ×2 IMPLANT
GOWN STRL REUS W/TWL XL LVL3 (GOWN DISPOSABLE) ×4 IMPLANT
HANDLE SUCTION POOLE (INSTRUMENTS) ×1 IMPLANT
KIT BASIN OR (CUSTOM PROCEDURE TRAY) ×2 IMPLANT
NDL SAFETY ECLIPSE 18X1.5 (NEEDLE) IMPLANT
NEEDLE HYPO 18GX1.5 SHARP (NEEDLE)
NEEDLE HYPO 22GX1.5 SAFETY (NEEDLE) ×2 IMPLANT
NEEDLE HYPO 25X1 1.5 SAFETY (NEEDLE) IMPLANT
NS IRRIG 1000ML POUR BTL (IV SOLUTION) ×2 IMPLANT
PACK LITHOTOMY IV (CUSTOM PROCEDURE TRAY) IMPLANT
PAD ABD 8X10 STRL (GAUZE/BANDAGES/DRESSINGS) ×2 IMPLANT
SPONGE HEMORRHOID 8X3CM (HEMOSTASIS) ×2 IMPLANT
SUCTION POOLE HANDLE (INSTRUMENTS) ×2
SUT CHROMIC 3 0 SH 27 (SUTURE) ×6 IMPLANT
SYR CONTROL 10ML LL (SYRINGE) ×2 IMPLANT
TOWEL OR 17X26 10 PK STRL BLUE (TOWEL DISPOSABLE) ×2 IMPLANT
WATER STERILE IRR 1500ML POUR (IV SOLUTION) IMPLANT
YANKAUER SUCT BULB TIP 10FT TU (MISCELLANEOUS) ×2 IMPLANT

## 2016-10-02 NOTE — Consult Note (Signed)
[facility] Surgery Consult Note  [name] 03-27-1946  [id number].    Requesting MD: Johnney Killian Chief Complaint/Reason for Consult: Rectal bleeding  HPI:  [name] is a 70yo male who was brought to [facility] today with uncontrolled rectal bleeding. Patient underwent prostate biopsy by Dr. Alyson Ingles today and had significant bleeding after the procedure. Pressure was held >30 minutes at the urology office but they could not stop the bleeding therefore patient was brought to the ED.  Upon arrival to ED his vitals were all stable. His only complaint is rectal bleeding. He denies dizziness or lightheadedness. Denies nausea or vomiting. On exam in ED unable to localize area of active bleeding. Last meal about 2 hours ago: cracker and Diet Coke  PMH significant for CAD s/p CABG 2006, CHF, COPD, DM, GERD, HTN, h/o stroke  Takes plavixx at home, stopped 09/27/16 for procedure today Smokes 1 PPD Lives at home with his wife  ROS: All systems reviewed and otherwise negative except for as above  Family History  Problem Relation Age of Onset  . Hypertension Other   . Lung cancer Mother   . Heart attack Father     Past Medical History:  Diagnosis Date  . CHF (congestive heart failure) ([place])   . COPD (chronic obstructive pulmonary disease) ([place])   . Coronary artery disease   . Diabetes mellitus without complication ([place])   . GERD (gastroesophageal reflux disease)   . Hypertension   . Pneumonia   . Stroke [facility])     Past Surgical History:  Procedure Laterality Date  . CORONARY ARTERY BYPASS GRAFT  2006    Social History:  reports that he has been smoking Cigarettes.  He has a 25.00 pack-year smoking history. He has never used smokeless tobacco. He reports that he does not drink alcohol. His drug history is not on file.  Allergies: No Known Allergies   (Not in a hospital admission)  Blood pressure 152/80, pulse 74, temperature 98.6 F (37 C), temperature source Oral, resp. rate  16, SpO2 99 %. Physical Exam: General: pleasant, WD/WN white male who is laying in bed in NAD HEENT: head is normocephalic, atraumatic.  Sclera are noninjected.  Ears and nose without any masses or lesions.  Mouth is pink and moist Heart: regular, rate, and rhythm.  No obvious murmurs, gallops, or rubs noted.  Palpable pedal pulses bilaterally Lungs: CTAB, no wheezes, rhonchi, or rales noted.  Respiratory effort nonlabored Abd: soft, NT/ND, +BS, no masses, hernias, or organomegaly MS: all 4 extremities are symmetrical with no cyanosis, clubbing, or edema. Skin: warm and dry with no masses, lesions, or rashes Psych: A&Ox3 with an appropriate affect. Neuro: CM 2-12 intact, extremity CSM intact bilaterally, normal speech Rectum: bright red blood per rectum, suspect bleeding is coming from prostate  No results found for this or any previous visit (from the past 66 hour(s)). No results found.    Assessment/Plan Uncontrollable rectal bleeding after prostate biopsy today - bright red blood per rectum began this afternoon after prostate biopsy today. Unable to control bleeding with pressure - patient takes plavixx, has been held since 09/27/16 for procedure today - vitals stable  CAD s/p CABG 2006 - on plavixx CHF COPD DM GERD HTN H/o stroke   Plan - I saw and evaluated this patient with Dr. Excell Seltzer. Unable to stop bleeding at bedside, recommend EUS in OR to localize and stop bleeding.  Please consult urology and medicine to help with this patient's care.  [name],  [facility] Surgery 10/02/2016, 4:29 PM Pager: [phone] Consults: [phone] Mon-Fri 7:00 am-4:30 pm Sat-Sun 7:00 am-11:30 am

## 2016-10-02 NOTE — Transfer of Care (Signed)
Immediate Anesthesia Transfer of Care Note  Patient: [name]  Procedure(s) Performed: Procedure(s): PROCTOSCOPY AND CONTROL OF RECTAL BLEEDING (N/A)  Patient Location: PACU  Anesthesia Type:General  Level of Consciousness: awake, alert , oriented and patient cooperative  Airway & Oxygen Therapy: Patient Spontanous Breathing and Patient connected to face mask oxygen  Post-op Assessment: Report given to RN, Post -op Vital signs reviewed and stable and Patient moving all extremities  Post vital signs: Reviewed and stable  Last Vitals:  Vitals:   10/02/16 1619  BP: 152/80  Pulse: 74  Resp: 16  Temp: 37 C    Last Pain:  Vitals:   10/02/16 1619  TempSrc: Oral         Complications: No apparent anesthesia complications

## 2016-10-02 NOTE — Anesthesia Preprocedure Evaluation (Addendum)
Anesthesia Evaluation  Patient identified by MRN, date of birth, ID band Patient awake    Reviewed: Allergy & Precautions, NPO status , Patient's Chart, lab work & pertinent test results  Airway Mallampati: II  TM Distance: >3 FB Neck ROM: Full    Dental no notable dental hx.    Pulmonary neg pulmonary ROS, pneumonia, COPD, Current Smoker,    Pulmonary exam normal breath sounds clear to auscultation       Cardiovascular hypertension, + CAD, + CABG and +CHF  negative cardio ROS Normal cardiovascular exam Rhythm:Regular Rate:Normal     Neuro/Psych negative neurological ROS  negative psych ROS   GI/Hepatic Neg liver ROS, GERD  ,  Endo/Other  negative endocrine ROSdiabetes  Renal/GU negative Renal ROS     Musculoskeletal negative musculoskeletal ROS (+)   Abdominal   Peds  Hematology negative hematology ROS (+)   Anesthesia Other Findings   Reproductive/Obstetrics                             Anesthesia Physical Anesthesia Plan  ASA: III  Anesthesia Plan: General   Post-op Pain Management:    Induction: Intravenous, Cricoid pressure planned and Rapid sequence  Airway Management Planned: Oral ETT  Additional Equipment:   Intra-op Plan:   Post-operative Plan: Extubation in OR  Informed Consent: I have reviewed the patients History and Physical, chart, labs and discussed the procedure including the risks, benefits and alternatives for the proposed anesthesia with the patient or authorized representative who has indicated his/her understanding and acceptance.   Dental advisory given  Plan Discussed with: CRNA  Anesthesia Plan Comments:        Anesthesia Quick Evaluation

## 2016-10-02 NOTE — Anesthesia Procedure Notes (Signed)
Procedure Name: Intubation Date/Time: 10/02/2016 5:34 PM Performed by: Carleene Cooper A Pre-anesthesia Checklist: Timeout performed, Patient identified, Emergency Drugs available, Suction available and Patient being monitored Patient Re-evaluated:Patient Re-evaluated prior to inductionOxygen Delivery Method: Circle system utilized Preoxygenation: Pre-oxygenation with 100% oxygen Intubation Type: IV induction, Cricoid Pressure applied and Rapid sequence Laryngoscope Size: Mac and 4 Grade View: Grade I Tube type: Oral Number of attempts: 1 Airway Equipment and Method: Stylet Placement Confirmation: ETT inserted through vocal cords under direct vision,  positive ETCO2 and breath sounds checked- equal and bilateral Secured at: 22 cm Tube secured with: Tape Dental Injury: Teeth and Oropharynx as per pre-operative assessment  Comments: RSI with cricoid pressure by Dr. Lissa Hoard. DL x 1 by CRNA with MAC 4 blade. Grade 1 view. ATOI. BBS=. + ETCO2.

## 2016-10-02 NOTE — Op Note (Signed)
Preoperative Diagnosis: Rectal bleeding [H21.9] Complication of procedure, initial encounter [T81.9XXA]  Postoprative Diagnosis: Rectal bleeding [X58.8] Complication of procedure, initial encounter [T81.9XXA]  Procedure: Procedure(s): PROCTOSCOPY AND CONTROL OF RECTAL BLEEDING   Surgeon: Excell Seltzer T   Assistants: Jackolyn Confer  Anesthesia:  General endotracheal anesthesia  Indications: Patient is a 70 year old male who underwent a trans rectal prostate biopsy earlier today. Significant bleeding was noted which was unable to be controlled with prolonged pressure. He was transferred to the emergency department. Examination there revealed ongoing fresh rectal bleeding which could not be exposed without anesthesia. I recommended proceeding to the operating room for proctoscopy and control of bleeding.  Discussed the procedure and indications with the patient and his family including risk of anesthesia.    Procedure Detail:  Patient was brought to the operating room and general endotracheal anesthesia induced in the stretcher. He was rolled into carefully padded prone jackknife position and the perineum prepped and draped. Patient timeout was performed and correct procedure verified. The anus was gently dilated to 3 fingers and a large rectal retractor placed. Immediately apparent was a punctate area of ongoing bleeding in the right side of the rectum overlying the prostate. This was exposed and bleeding was controlled with 2 deep figure-of-eight sutures. We observed the bleeding area for several minutes and complete hemostasis appeared to be obtained. A Gelfoam rectal pack was placed. A median mesh panties placed. Sponge needle and instrument counts were correct.    Findings: As above  Estimated Blood Loss:  less than 50 mL         Drains: None  Blood Given: none          Specimens: None        Complications:  * No complications entered in OR log *         Disposition:  PACU - hemodynamically stable.         Condition: stable

## 2016-10-02 NOTE — H&P (Signed)
[facility] Admission Note  [name] 04/08/1946  [id number].    Requesting MD: Johnney Killian Chief Complaint/Reason for Consult: Rectal bleeding  HPI:  [name] is a 70yo male who was brought to [facility] today with uncontrolled rectal bleeding. Patient underwent prostate biopsy by Dr. Alyson Ingles today and had significant bleeding after the procedure. Pressure was held >30 minutes at the urology office but they could not stop the bleeding therefore patient was brought to the ED.  Upon arrival to ED his vitals were all stable. His only complaint is rectal bleeding. He denies dizziness or lightheadedness. Denies nausea or vomiting. On exam in ED unable to localize area of active bleeding. Last meal about 2 hours ago: cracker and Diet Coke  PMH significant for CAD s/p CABG 2006, CHF, COPD, DM, GERD, HTN, h/o stroke  Takes plavixx at home, stopped 09/27/16 for procedure today Smokes 1 PPD Lives at home with his wife  ROS: All systems reviewed and otherwise negative except for as above  Family History  Problem Relation Age of Onset  . Lung cancer Mother   . Heart attack Father   . Hypertension Other     Past Medical History:  Diagnosis Date  . CHF (congestive heart failure) ([place])   . COPD (chronic obstructive pulmonary disease) ([place])   . Coronary artery disease   . Diabetes mellitus without complication ([place])   . GERD (gastroesophageal reflux disease)   . Hypertension   . Pneumonia   . Stroke [facility])     Past Surgical History:  Procedure Laterality Date  . CORONARY ARTERY BYPASS GRAFT  2006    Social History:  reports that he has been smoking Cigarettes.  He has a 25.00 pack-year smoking history. He has never used smokeless tobacco. He reports that he does not drink alcohol. His drug history is not on file.  Allergies: No Known Allergies   (Not in a hospital admission)  Blood pressure 152/80, pulse 74, temperature 98.6 F (37 C), temperature source Oral, resp.  rate 16, SpO2 99 %. Physical Exam: General: pleasant, WD/WN white male who is laying in bed in NAD HEENT: head is normocephalic, atraumatic.  Sclera are noninjected.  Ears and nose without any masses or lesions.  Mouth is pink and moist Heart: regular, rate, and rhythm.  No obvious murmurs, gallops, or rubs noted.  Palpable pedal pulses bilaterally Lungs: CTAB, no wheezes, rhonchi, or rales noted.  Respiratory effort nonlabored Abd: soft, NT/ND, +BS, no masses, hernias, or organomegaly MS: all 4 extremities are symmetrical with no cyanosis, clubbing, or edema. Skin: warm and dry with no masses, lesions, or rashes Psych: A&Ox3 with an appropriate affect. Neuro: CM 2-12 intact, extremity CSM intact bilaterally, normal speech Rectum: bright red blood per rectum, suspect bleeding is coming from prostate  Results for orders placed or performed during the hospital encounter of 10/02/16 (from the past 48 hour(s))  Basic metabolic panel     Status: Abnormal   Collection Time: 10/02/16  4:21 PM  Result Value Ref Range   Sodium 140 135 - 145 mmol/L   Potassium 4.9 3.5 - 5.1 mmol/L   Chloride 108 101 - 111 mmol/L   CO2 27 22 - 32 mmol/L   Glucose, Bld 109 (H) 65 - 99 mg/dL   BUN 21 (H) 6 - 20 mg/dL   Creatinine, Ser 1.72 (H) 0.61 - 1.24 mg/dL   Calcium 9.1 8.9 - 10.3 mg/dL   GFR calc non Af Amer 38 (L) >60 mL/min  GFR calc Af Amer 44 (L) >60 mL/min    Comment: (NOTE) The eGFR has been calculated using the CKD EPI equation. This calculation has not been validated in all clinical situations. eGFR's persistently <60 mL/min signify possible Chronic Kidney Disease.    Anion gap 5 5 - 15  CBC with Differential     Status: Abnormal   Collection Time: 10/02/16  4:21 PM  Result Value Ref Range   WBC 12.0 (H) 4.0 - 10.5 K/uL   RBC 5.23 4.22 - 5.81 MIL/uL   Hemoglobin 16.1 13.0 - 17.0 g/dL   HCT 47.7 39.0 - 52.0 %   MCV 91.2 78.0 - 100.0 fL   MCH 30.8 26.0 - 34.0 pg   MCHC 33.8 30.0 - 36.0 g/dL    RDW 14.2 11.5 - 15.5 %   Platelets 249 150 - 400 K/uL   Neutrophils Relative % 71 %   Neutro Abs 8.4 (H) 1.7 - 7.7 K/uL   Lymphocytes Relative 20 %   Lymphs Abs 2.4 0.7 - 4.0 K/uL   Monocytes Relative 8 %   Monocytes Absolute 1.0 0.1 - 1.0 K/uL   Eosinophils Relative 1 %   Eosinophils Absolute 0.1 0.0 - 0.7 K/uL   Basophils Relative 0 %   Basophils Absolute 0.0 0.0 - 0.1 K/uL  Type and screen [place]     Status: None (Preliminary result)   Collection Time: 10/02/16  4:21 PM  Result Value Ref Range   ABO/RH(D) O POS    Antibody Screen PENDING    Sample Expiration 10/05/2016   Protime-INR     Status: None   Collection Time: 10/02/16  4:21 PM  Result Value Ref Range   Prothrombin Time 13.0 11.4 - 15.2 seconds   INR 0.98   I-stat Chem 8, ED     Status: Abnormal   Collection Time: 10/02/16  4:32 PM  Result Value Ref Range   Sodium 140 135 - 145 mmol/L   Potassium 4.9 3.5 - 5.1 mmol/L   Chloride 104 101 - 111 mmol/L   BUN 22 (H) 6 - 20 mg/dL   Creatinine, Ser 1.70 (H) 0.61 - 1.24 mg/dL   Glucose, Bld 103 (H) 65 - 99 mg/dL   Calcium, Ion 1.17 1.15 - 1.40 mmol/L   TCO2 26 0 - 100 mmol/L   Hemoglobin 16.7 13.0 - 17.0 g/dL   HCT 49.0 39.0 - 52.0 %  CBG monitoring, ED     Status: Abnormal   Collection Time: 10/02/16  4:34 PM  Result Value Ref Range   Glucose-Capillary 111 (H) 65 - 99 mg/dL   No results found.    Assessment/Plan Uncontrollable rectal bleeding after prostate biopsy today - bright red blood per rectum began this afternoon after prostate biopsy today. Unable to control bleeding with pressure - patient takes plavixx, has been held since 09/27/16 for procedure today - vitals stable  CAD s/p CABG 2006 - on plavixx CHF COPD DM GERD HTN H/o stroke   Plan - I saw and evaluated this patient with Dr. Excell Seltzer. Unable to stop bleeding at bedside, recommend EUS in OR to localize and stop bleeding. Admit to med surg. Please consult medicine  to assist with management of patient's medical problems.  Jerrye Beavers, [facility] Surgery 10/02/2016, 4:29 PM Pager: [phone] Consults: [phone] Mon-Fri 7:00 am-4:30 pm Sat-Sun 7:00 am-11:30 am

## 2016-10-02 NOTE — Anesthesia Postprocedure Evaluation (Signed)
Anesthesia Post Note  Patient: [name]  Procedure(s) Performed: Procedure(s) (LRB): PROCTOSCOPY AND CONTROL OF RECTAL BLEEDING (N/A)  Patient location during evaluation: PACU Anesthesia Type: General Level of consciousness: sedated and patient cooperative Pain management: pain level controlled Vital Signs Assessment: post-procedure vital signs reviewed and stable Respiratory status: spontaneous breathing Cardiovascular status: stable Anesthetic complications: no    Last Vitals:  Vitals:   10/02/16 1945 10/02/16 2000  BP: (!) 148/81 (!) 151/78  Pulse: 76 78  Resp: 19 20  Temp: 36.9 C 36.9 C    Last Pain:  Vitals:   10/02/16 1945  TempSrc:   PainSc: 0-No pain                 Nolon Nations

## 2016-10-02 NOTE — ED Provider Notes (Addendum)
[facility] DEPT Provider Note   CSN: [id number] Arrival date & time: 10/02/16  1603     History   Chief Complaint Chief Complaint  Patient presents with  . Rectal Bleeding    HPI [name] is a 70 y.o. male.  HPI Patient had a prostate biopsy at Dr. Noland Fordyce office today. Dr. Lynden Oxford called to transfer the patient to the emergency department reporting that post biopsy, the patient continued to have bleeding that did not stop with continuous direct pressure for over 20 minutes. He estimated the patient have blood loss of 300-400 mL. He advised he had already discussed the case with Dr. Excell Seltzer to anticipated evaluating the patient in the emergency department. The patient reports he has no pain. He denies that he has felt lightheaded since the procedure. Patient reports he has a history of COPD, CHF, CAD and DM. Patient states he does take Plavix but had discontinued it last Saturday (5 days ago).  Past Medical History:  Diagnosis Date  . CHF (congestive heart failure) ([place])   . COPD (chronic obstructive pulmonary disease) ([place])   . Coronary artery disease   . Diabetes mellitus without complication ([place])   . GERD (gastroesophageal reflux disease)   . Hypertension   . Pneumonia   . Stroke [facility])     Patient Active Problem List   Diagnosis Date Noted  . COPD exacerbation ([place]) 01/17/2016  . Chronic systolic heart failure ([place]) 12/12/2015  . Essential hypertension 12/12/2015  . COPD (chronic obstructive pulmonary disease) with chronic bronchitis ([place]) 12/12/2015  . Diabetes ([place]) 12/12/2015  . Tobacco abuse 12/12/2015    Past Surgical History:  Procedure Laterality Date  . CORONARY ARTERY BYPASS GRAFT  2006       Home Medications    Prior to Admission medications   Medication Sig Start Date End Date Taking? Authorizing Provider  acetaminophen (TYLENOL) 325 MG tablet Take 2 tablets (650 mg total) by mouth every 6 (six) hours as needed for mild pain (or Fever  >/= 101). 01/20/16   [name], MD  albuterol (PROVENTIL HFA;VENTOLIN HFA) 108 (90 Base) MCG/ACT inhaler Inhale 2 puffs into the lungs every 6 (six) hours as needed for wheezing or shortness of breath.    Historical Provider, MD  aspirin EC 81 MG tablet Take 81 mg by mouth daily.    Historical Provider, MD  clopidogrel (PLAVIX) 75 MG tablet Take 75 mg by mouth daily.    Historical Provider, MD  Fluticasone-Salmeterol (ADVAIR DISKUS) 250-50 MCG/DOSE AEPB Inhale 1 puff into the lungs 2 (two) times daily. 12/07/15   Dustin Flock, MD  furosemide (LASIX) 40 MG tablet Take 40 mg by mouth 2 (two) times daily.    Historical Provider, MD  gabapentin (NEURONTIN) 100 MG capsule Take 100 mg by mouth as needed.     Historical Provider, MD  guaiFENesin-codeine 100-10 MG/5ML syrup Take 10 mLs by mouth every 6 (six) hours as needed for cough. 01/20/16   [name], MD  hydrocortisone cream 1 % Apply topically 3 (three) times daily. 01/20/16   [name], MD  insulin glargine (LANTUS) 100 UNIT/ML injection Inject 50 Units into the skin at bedtime.     Historical Provider, MD  isosorbide mononitrate (IMDUR) 30 MG 24 hr tablet Take 30 mg by mouth daily.    Historical Provider, MD  lisinopril (PRINIVIL,ZESTRIL) 10 MG tablet Take 10 mg by mouth daily.    Historical Provider, MD  metFORMIN (GLUCOPHAGE) 1000 MG tablet Take 1,000 mg by  mouth 2 (two) times daily with a meal.    Historical Provider, MD  metoprolol (LOPRESSOR) 50 MG tablet Take 50 mg by mouth 2 (two) times daily.    Historical Provider, MD  Multiple Vitamin (MULTIVITAMIN WITH MINERALS) TABS tablet Take 1 tablet by mouth daily.    Historical Provider, MD  multivitamin-lutein (OCUVITE-LUTEIN) CAPS capsule Take 1 capsule by mouth daily.    Historical Provider, MD  omeprazole (PRILOSEC) 20 MG capsule Take 20 mg by mouth daily.    Historical Provider, MD  oxyCODONE (OXY IR/ROXICODONE) 5 MG immediate release tablet Take 1 tablet (5 mg total) by mouth every 4  (four) hours as needed for moderate pain. 01/20/16   [name], MD  potassium chloride (K-DUR) 10 MEQ tablet Take 10 mEq by mouth daily.    Historical Provider, MD  predniSONE (STERAPRED UNI-PAK 21 TAB) 10 MG (21) TBPK tablet Take 1 tablet (10 mg total) by mouth daily. Take 6 tablets by mouth for 1 day followed by  5 tablets by mouth for 1 day followed by  4 tablets by mouth for 1 day followed by  3 tablets by mouth for 1 day followed by  2 tablets by mouth for 1 day followed by  1 tablet by mouth for a day and stop 01/20/16   [name], MD  sacubitril-valsartan (ENTRESTO) 24-26 MG Take 1 tablet by mouth 2 (two) times daily. 01/09/16   Alisa Graff, FNP  simvastatin (ZOCOR) 20 MG tablet Take 20 mg by mouth at bedtime.    Historical Provider, MD    Family History Family History  Problem Relation Age of Onset  . Lung cancer Mother   . Heart attack Father   . Hypertension Other     Social History Social History  Substance Use Topics  . Smoking status: Current Every Day Smoker    Packs/day: 0.50    Years: 50.00    Types: Cigarettes  . Smokeless tobacco: Never Used  . Alcohol use No     Allergies   Patient has no known allergies.   Review of Systems Review of Systems 10 Systems reviewed and are negative for acute change except as noted in the HPI.  Physical Exam Updated Vital Signs BP 152/80 (BP Location: Left Arm)   Pulse 74   Temp 98.6 F (37 C) (Oral)   Resp 16   SpO2 99%   Physical Exam  Constitutional: He is oriented to person, place, and time. He appears well-developed and well-nourished. No distress.  HENT:  Head: Normocephalic and atraumatic.  Eyes: Conjunctivae are normal.  Cardiovascular: Normal rate and regular rhythm.   No murmur heard. Occasional ectopy. 2/6 systolic ejection murmur.  Pulmonary/Chest: Effort normal and breath sounds normal. No respiratory distress.  Abdominal: Soft. He exhibits no distension. There is no tenderness.  Genitourinary:   Genitourinary Comments: Continuous trickle of bright red blood from the anus. Patient arrives with several clots and a soaked chuck.  Musculoskeletal: He exhibits no edema or deformity.  Neurological: He is alert and oriented to person, place, and time. No cranial nerve deficit. He exhibits normal muscle tone. Coordination normal.  Skin: Skin is warm and dry. No pallor.  Psychiatric: He has a normal mood and affect.  Nursing note and vitals reviewed.    ED Treatments / Results  Labs (all labs ordered are listed, but only abnormal results are displayed) Labs Reviewed  BASIC METABOLIC PANEL - Abnormal; Notable for the following:  Result Value   Glucose, Bld 109 (*)    BUN 21 (*)    Creatinine, Ser 1.72 (*)    GFR calc non Af Amer 38 (*)    GFR calc Af Amer 44 (*)    All other components within normal limits  CBC WITH DIFFERENTIAL/PLATELET - Abnormal; Notable for the following:    WBC 12.0 (*)    Neutro Abs 8.4 (*)    All other components within normal limits  I-STAT CHEM 8, ED - Abnormal; Notable for the following:    BUN 22 (*)    Creatinine, Ser 1.70 (*)    Glucose, Bld 103 (*)    All other components within normal limits  CBG MONITORING, ED - Abnormal; Notable for the following:    Glucose-Capillary 111 (*)    All other components within normal limits  PROTIME-INR  TYPE AND SCREEN    EKG  EKG Interpretation  Date/Time:  Thursday October 02 2016 16:30:04 EST Ventricular Rate:  72 PR Interval:    QRS Duration: 107 QT Interval:  411 QTC Calculation: 450 R Axis:   82 Text Interpretation:  Sinus or ectopic atrial rhythm Prolonged PR interval Borderline right axis deviation Repol abnrm suggests ischemia, anterolateral agree first degree AV block. no QRS morphology change compared with previous. Confirmed by Johnney Killian, MD, Jeannie Done [id number]) on 10/02/2016 5:01:04 PM       Radiology No results found.  Procedures Procedures (including critical care  time)  Medications Ordered in ED Medications  ceFAZolin (ANCEF) IVPB 2g/100 mL premix (not administered)  0.9 %  sodium chloride infusion ( Intravenous New Bag/Given 10/02/16 1639)     Initial Impression / Assessment and Plan / ED Course  I have reviewed the triage vital signs and the nursing notes.  Pertinent labs & imaging results that were available during my care of the patient were reviewed by me and considered in my medical decision making (see chart for details).  Clinical Course    Consultation: Reviewed with Dr. Excell Seltzer who has seen the patient in the emergency department.  Final Clinical Impressions(s) / ED Diagnoses   Final diagnoses:  Rectal bleeding  Complication of procedure, initial encounter   Patient is sent from Dr. Noland Fordyce office for active bleeding status post prostate biopsy. Patient's mental status is normal, airways intact, vital signs are stable. On examination he does have ongoing active bleeding from the rectum. Patient will go to the operating room for definitive care with Dr. Excell Seltzer. New Prescriptions New Prescriptions   No medications on file     Charlesetta Shanks, MD 10/02/16 [place], MD 10/02/16 1701

## 2016-10-02 NOTE — ED Notes (Signed)
Bed: HC09 Expected date:  Expected time:  Means of arrival:  Comments: EMS- uncontrolled rectal bleeding after biopsy

## 2016-10-02 NOTE — ED Triage Notes (Signed)
Per EMS, pt is coming from [facility] after having prostate biopsy. The procedure was completed at 12 and the pt has been bleeding since. Staff reports an increase in blood loss in the last hour with passing of clots. EMS reports pt only complains of rectal pain.

## 2016-10-03 ENCOUNTER — Encounter (HOSPITAL_COMMUNITY): Payer: Self-pay | Admitting: General Surgery

## 2016-10-03 LAB — CBC
HCT: 40.3 % (ref 39.0–52.0)
Hemoglobin: 13.5 g/dL (ref 13.0–17.0)
MCH: 30.4 pg (ref 26.0–34.0)
MCHC: 33.5 g/dL (ref 30.0–36.0)
MCV: 90.8 fL (ref 78.0–100.0)
PLATELETS: 228 10*3/uL (ref 150–400)
RBC: 4.44 MIL/uL (ref 4.22–5.81)
RDW: 14.4 % (ref 11.5–15.5)
WBC: 9.3 10*3/uL (ref 4.0–10.5)

## 2016-10-03 LAB — GLUCOSE, CAPILLARY
GLUCOSE-CAPILLARY: 128 mg/dL — AB (ref 65–99)
GLUCOSE-CAPILLARY: 132 mg/dL — AB (ref 65–99)
GLUCOSE-CAPILLARY: 64 mg/dL — AB (ref 65–99)
Glucose-Capillary: 196 mg/dL — ABNORMAL HIGH (ref 65–99)
Glucose-Capillary: 165 mg/dL — ABNORMAL HIGH (ref 65–99)

## 2016-10-03 MED ORDER — METOPROLOL TARTRATE 50 MG PO TABS
50.0000 mg | ORAL_TABLET | Freq: Two times a day (BID) | ORAL | Status: DC
  Administered 2016-10-03: 50 mg via ORAL
  Filled 2016-10-03: qty 1

## 2016-10-03 MED ORDER — DEXTROSE-NACL 5-0.45 % IV SOLN
INTRAVENOUS | Status: DC
  Administered 2016-10-03: 02:00:00 via INTRAVENOUS

## 2016-10-03 MED ORDER — FUROSEMIDE 40 MG PO TABS
40.0000 mg | ORAL_TABLET | Freq: Two times a day (BID) | ORAL | Status: DC
  Administered 2016-10-03: 40 mg via ORAL
  Filled 2016-10-03: qty 1

## 2016-10-03 MED ORDER — LISINOPRIL 20 MG PO TABS
40.0000 mg | ORAL_TABLET | Freq: Every day | ORAL | Status: DC
  Administered 2016-10-03: 40 mg via ORAL
  Filled 2016-10-03: qty 2

## 2016-10-03 MED ORDER — DEXTROSE 50 % IV SOLN
1.0000 | Freq: Once | INTRAVENOUS | Status: AC
  Administered 2016-10-03: 50 mL via INTRAVENOUS
  Filled 2016-10-03: qty 50

## 2016-10-03 NOTE — Progress Notes (Signed)
Patient ID: [name], male   DOB: 1946/06/02, 70 y.o.   MRN: [id number]  [facility] Surgery Progress Note  1 Day Post-Op  Subjective: Feeling well this morning. Denies any pain. Tolerating diet. Has not had a BM this morning, and has not noticed any bright red blood per rectum.  Objective: Vital signs in last 24 hours: Temp:  [97.6 F (36.4 C)-99.1 F (37.3 C)] 99.1 F (37.3 C) (12/01 0449) Pulse Rate:  [60-84] 60 (12/01 0449) Resp:  [15-20] 18 (12/01 0449) BP: (142-191)/(56-81) 155/73 (12/01 0449) SpO2:  [94 %-100 %] 94 % (12/01 0449) Weight:  [166 lb 10.7 oz (75.6 kg)] 166 lb 10.7 oz (75.6 kg) (11/30 2022) Last BM Date: 10/02/16  Intake/Output from previous day: 11/30 0701 - 12/01 0700 In: 1783.3 [P.O.:240; I.V.:1543.3] Out: 950 [Urine:600; Blood:350] Intake/Output this shift: No intake/output data recorded.  PE: Gen:  Alert, NAD, pleasant Card:  RRR, no M/G/R heard Pulm:  CTAB, no W/R/R Abd: Soft, NT/ND, +BS, no HSM  Lab Results:   Recent Labs  10/02/16 1621 10/02/16 1632 10/03/16 0502  WBC 12.0*  --  9.3  HGB 16.1 16.7 13.5  HCT 47.7 49.0 40.3  PLT 249  --  228   BMET  Recent Labs  10/02/16 1621 10/02/16 1632  NA 140 140  K 4.9 4.9  CL 108 104  CO2 27  --   GLUCOSE 109* 103*  BUN 21* 22*  CREATININE 1.72* 1.70*  CALCIUM 9.1  --    PT/INR  Recent Labs  10/02/16 1621  LABPROT 13.0  INR 0.98   CMP     Component Value Date/Time   NA 140 10/02/2016 1632   NA 136 04/30/2014 0548   K 4.9 10/02/2016 1632   K 3.8 04/30/2014 0548   CL 104 10/02/2016 1632   CL 105 04/30/2014 0548   CO2 27 10/02/2016 1621   CO2 25 04/30/2014 0548   GLUCOSE 103 (H) 10/02/2016 1632   GLUCOSE 84 04/30/2014 0548   BUN 22 (H) 10/02/2016 1632   BUN 14 04/30/2014 0548   CREATININE 1.70 (H) 10/02/2016 1632   CREATININE 1.36 (H) 04/30/2014 0548   CALCIUM 9.1 10/02/2016 1621   CALCIUM 8.7 04/30/2014 0548   PROT 6.9 01/15/2016 2115   PROT 7.7 04/28/2014  2346   ALBUMIN 3.9 01/15/2016 2115   ALBUMIN 4.0 04/28/2014 2346   AST 22 01/15/2016 2115   AST 10 (L) 04/28/2014 2346   ALT 14 (L) 01/15/2016 2115   ALT 17 04/28/2014 2346   ALKPHOS 88 01/15/2016 2115   ALKPHOS 92 04/28/2014 2346   BILITOT 1.0 01/15/2016 2115   BILITOT 1.0 04/28/2014 2346   GFRNONAA 38 (L) 10/02/2016 1621   GFRNONAA 53 (L) 04/30/2014 0548   GFRAA 44 (L) 10/02/2016 1621   GFRAA >60 04/30/2014 0548   Lipase  No results found for: LIPASE     Studies/Results: No results found.  Anti-infectives: Anti-infectives    Start     Dose/Rate Route Frequency Ordered Stop   10/03/16 0600  ceFAZolin (ANCEF) IVPB 2g/100 mL premix     2 g 200 mL/hr over 30 Minutes Intravenous On call to O.R. 10/02/16 1648 10/02/16 1737       Assessment/Plan Uncontrollable rectal bleeding after prostate biopsy today S/p PROCTOSCOPY AND CONTROL OF RECTAL BLEEDING 11/30 Dr. Excell Seltzer - POD 1  CAD s/p CABG 2006 - on plavixx, held since 11/25 CHF COPD - Dulera BID, albuterol and Duoneb PRN DM - SSI HLD -  simvastatin GERD - protonix PRN HTN - hydralazine and metoprolol PRN, restart home meds H/o stroke  Tobacco abuse - nicotine patch  FEN - CLD advance as tolerated VTE - SCDs  Plan - patient feeling well, tolerating diet, ready for discharge. Continue to hold plavix and follow-up early next week with PCP to discuss when to restart. Follow-up with Dr. Excell Seltzer PRN. Follow-up with urology regarding prostate biopsy results   LOS: 0 days    Jerrye Beavers , [facility] Surgery 10/03/2016, 8:49 AM Pager: [phone] Consults: [phone] Mon-Fri 7:00 am-4:30 pm Sat-Sun 7:00 am-11:30 am

## 2016-10-03 NOTE — Care Management Note (Signed)
Case Management Note  Patient Details  Name: [name] MRN: [id number] Date of Birth: [birth year]  Subjective/Objective: 70 y/o m admitted w/rectal bleed. From home.                   Action/Plan:d/c home.  Expected Discharge Date:                  Expected Discharge Plan:  Home/Self Care  In-House Referral:     Discharge planning Services  CM Consult  Post Acute Care Choice:    Choice offered to:     DME Arranged:    DME Agency:     HH Arranged:    [place] Agency:     Status of Service:  Completed, signed off  If discussed at [employer] of Stay Meetings, dates discussed:    Additional Comments:  Dessa Phi, RN 10/03/2016, 11:59 AM

## 2016-10-03 NOTE — Progress Notes (Signed)
After Dextrose 50% ampule was administered, the CBG was 196. Patient stated that he felt better. PCP on call ,was notified.

## 2016-10-03 NOTE — Progress Notes (Signed)
CRITICAL VALUE ALERT  Critical value received:  CBG 64  Date of notification:  0039  Time of notification: 0039  Critical value read back:yes Nurse who received alert:  Dellie Catholic  MD notified (1st page):  yes  Time of first page:0045

## 2016-10-03 NOTE — Discharge Instructions (Signed)
Continue to hold your Plavix until you follow-up with your primary care physician next week You may notice some blood in your stools for a few days You may advance your diet as tolerated

## 2016-10-03 NOTE — Discharge Summary (Signed)
[facility] Discharge Summary   Patient ID: [name] MRN: [id number] DOB/AGE: September 24, 1946 70 y.o.  Admit date: 10/02/2016 Discharge date: 10/03/2016  Admitting Diagnosis: Rectal bleeding  Discharge Diagnosis Patient Active Problem List   Diagnosis Date Noted  . Bright red rectal bleeding 10/02/2016  . COPD exacerbation ([place]) 01/17/2016  . Chronic systolic heart failure ([place]) 12/12/2015  . Essential hypertension 12/12/2015  . COPD (chronic obstructive pulmonary disease) with chronic bronchitis ([place]) 12/12/2015  . Diabetes ([place]) 12/12/2015  . Tobacco abuse 12/12/2015    Consultants None  Imaging: No results found.  Procedures Dr. Excell Seltzer (10/02/16) - PROCTOSCOPY AND CONTROL OF RECTAL BLEEDING  Hospital Course:  Neils Siracusa is a 70yo male who presented to [facility] 10/02/16 with uncontrolled rectal bleeding following a prostate biopsy earlier in the day.  Bleeding was unable to be controlled despite >30 minutes of holding pressure in the urology office, therefore the patient was admitted and underwent procedure listed above.  Tolerated procedure well and was transferred to the floor.  Hemoglobin did drop to 13.5 on POD1, but the patient was stable. His diet was advanced as tolerated.  On POD1 the patient was voiding well, tolerating diet, ambulating well, pain well controlled, vital signs stable, and felt stable for discharge home.  Patient will hold plavix until his follow-up next week with PCP. He will follow-up with urology regarding prostate biopsy results. He may follow-up with general surgery PRN.  Physical Exam: Gen:  Alert, NAD, pleasant Card:  RRR, no M/G/R heard Pulm:  CTAB, no W/R/R Abd: Soft, NT/ND, +BS, no HSM     Medication List    STOP taking these medications   aspirin EC 81 MG tablet   clopidogrel 75 MG tablet Commonly known as:  PLAVIX     TAKE these medications   acetaminophen 325 MG tablet Commonly known as:  TYLENOL Take 2 tablets  (650 mg total) by mouth every 6 (six) hours as needed for mild pain (or Fever >/= 101).   albuterol 108 (90 Base) MCG/ACT inhaler Commonly known as:  PROVENTIL HFA;VENTOLIN HFA Inhale 2 puffs into the lungs every 6 (six) hours as needed for wheezing or shortness of breath.   Fluticasone-Salmeterol 250-50 MCG/DOSE Aepb Commonly known as:  ADVAIR DISKUS Inhale 1 puff into the lungs 2 (two) times daily.   furosemide 40 MG tablet Commonly known as:  LASIX Take 40 mg by mouth 2 (two) times daily.   gabapentin 100 MG capsule Commonly known as:  NEURONTIN Take 100 mg by mouth as needed.   hydrocortisone cream 1 % Apply topically 3 (three) times daily. What changed:  how much to take  when to take this  reasons to take this   insulin glargine 100 UNIT/ML injection Commonly known as:  LANTUS Inject 50 Units into the skin at bedtime.   ipratropium-albuterol 0.5-2.5 (3) MG/3ML Soln Commonly known as:  DUONEB Inhale 3 mLs into the lungs 4 (four) times daily as needed for shortness of breath or wheezing.   isosorbide mononitrate 30 MG 24 hr tablet Commonly known as:  IMDUR Take 30 mg by mouth daily.   lisinopril 40 MG tablet Commonly known as:  PRINIVIL,ZESTRIL Take 40 mg by mouth daily.   metFORMIN 1000 MG tablet Commonly known as:  GLUCOPHAGE Take 1,000 mg by mouth 2 (two) times daily with a meal.   metoprolol 50 MG tablet Commonly known as:  LOPRESSOR Take 50 mg by mouth 2 (two) times daily.   multivitamin with minerals Tabs  tablet Take 1 tablet by mouth daily.   multivitamin-lutein Caps capsule Take 1 capsule by mouth daily.   omeprazole 20 MG capsule Commonly known as:  PRILOSEC Take 20 mg by mouth daily as needed (heartburn).   potassium chloride 10 MEQ tablet Commonly known as:  K-DUR Take 10 mEq by mouth daily.   sacubitril-valsartan 24-26 MG Commonly known as:  ENTRESTO Take 1 tablet by mouth 2 (two) times daily.   simvastatin 20 MG tablet Commonly  known as:  ZOCOR Take 20 mg by mouth at bedtime.        Follow-up Information    BABAOFF, Caryl Bis, MD. Call.   Specialty:  Family Medicine Why:  Please call today to make a follow-up appointemnt with you primary care physician early next week to discuss when to restart Plavix. Contact information: [address] [facility] [place] [address] [phone]        Nicolette Bang, MD. Call.   Specialty:  Urology Why:  Follo-wup with urology to discuss prostate biopsy results Contact information: [place] [address] [phone]           Signed: Jerrye Beavers, [facility] 10/03/2016, 1:05 PM Pager: [phone] Consults: [phone] Mon-Fri 7:00 am-4:30 pm Sat-Sun 7:00 am-11:30 am

## 2016-10-09 DIAGNOSIS — Z79899 Other long term (current) drug therapy: Secondary | ICD-10-CM | POA: Diagnosis not present

## 2016-10-09 DIAGNOSIS — E7800 Pure hypercholesterolemia, unspecified: Secondary | ICD-10-CM | POA: Diagnosis not present

## 2016-10-09 DIAGNOSIS — N183 Chronic kidney disease, stage 3 (moderate): Secondary | ICD-10-CM | POA: Diagnosis not present

## 2016-10-09 DIAGNOSIS — E1122 Type 2 diabetes mellitus with diabetic chronic kidney disease: Secondary | ICD-10-CM | POA: Diagnosis not present

## 2016-10-09 DIAGNOSIS — Z794 Long term (current) use of insulin: Secondary | ICD-10-CM | POA: Diagnosis not present

## 2016-10-10 ENCOUNTER — Other Ambulatory Visit: Payer: Self-pay | Admitting: Urology

## 2016-10-10 ENCOUNTER — Encounter: Payer: Self-pay | Admitting: Radiation Oncology

## 2016-10-10 DIAGNOSIS — C61 Malignant neoplasm of prostate: Secondary | ICD-10-CM | POA: Diagnosis not present

## 2016-10-14 DIAGNOSIS — E119 Type 2 diabetes mellitus without complications: Secondary | ICD-10-CM | POA: Diagnosis not present

## 2016-10-16 DIAGNOSIS — I2510 Atherosclerotic heart disease of native coronary artery without angina pectoris: Secondary | ICD-10-CM | POA: Diagnosis not present

## 2016-10-16 DIAGNOSIS — N183 Chronic kidney disease, stage 3 (moderate): Secondary | ICD-10-CM | POA: Diagnosis not present

## 2016-10-16 DIAGNOSIS — Z794 Long term (current) use of insulin: Secondary | ICD-10-CM | POA: Diagnosis not present

## 2016-10-16 DIAGNOSIS — E7800 Pure hypercholesterolemia, unspecified: Secondary | ICD-10-CM | POA: Diagnosis not present

## 2016-10-16 DIAGNOSIS — I10 Essential (primary) hypertension: Secondary | ICD-10-CM | POA: Diagnosis not present

## 2016-10-16 DIAGNOSIS — C61 Malignant neoplasm of prostate: Secondary | ICD-10-CM | POA: Diagnosis not present

## 2016-10-16 DIAGNOSIS — Z79899 Other long term (current) drug therapy: Secondary | ICD-10-CM | POA: Diagnosis not present

## 2016-10-16 DIAGNOSIS — E1122 Type 2 diabetes mellitus with diabetic chronic kidney disease: Secondary | ICD-10-CM | POA: Diagnosis not present

## 2016-10-22 DIAGNOSIS — E782 Mixed hyperlipidemia: Secondary | ICD-10-CM | POA: Diagnosis not present

## 2016-10-22 DIAGNOSIS — I10 Essential (primary) hypertension: Secondary | ICD-10-CM | POA: Diagnosis not present

## 2016-10-22 DIAGNOSIS — I5022 Chronic systolic (congestive) heart failure: Secondary | ICD-10-CM | POA: Diagnosis not present

## 2016-10-22 DIAGNOSIS — I25810 Atherosclerosis of coronary artery bypass graft(s) without angina pectoris: Secondary | ICD-10-CM | POA: Diagnosis not present

## 2016-10-22 DIAGNOSIS — I739 Peripheral vascular disease, unspecified: Secondary | ICD-10-CM | POA: Diagnosis not present

## 2016-10-23 ENCOUNTER — Encounter (HOSPITAL_COMMUNITY)
Admission: RE | Admit: 2016-10-23 | Discharge: 2016-10-23 | Disposition: A | Discharge status: Home / Self Care | Payer: PPO | Source: Ambulatory Visit | Attending: Urology | Admitting: Urology

## 2016-10-23 DIAGNOSIS — C61 Malignant neoplasm of prostate: Secondary | ICD-10-CM | POA: Insufficient documentation

## 2016-10-23 DIAGNOSIS — K8020 Calculus of gallbladder without cholecystitis without obstruction: Secondary | ICD-10-CM | POA: Diagnosis not present

## 2016-10-23 MED ORDER — TECHNETIUM TC 99M MEDRONATE IV KIT: 25.0000 | PACK | Freq: Once | INTRAVENOUS | Status: DC | PRN

## 2016-11-05 NOTE — Progress Notes (Signed)
GU Location of Tumor / Histology: prostatic adenocarcinoma  If Prostate Cancer, Gleason Score is (3 + 4) and PSA is (6.5)  Rob Mciver reports LUTS began October 2014 but, have progressively gotten worse. Patient was referred by Dr. Dimas Aguas, MD to Dr. Alyson Ingles for evaluation of an elevated PSA in November of 2017   Biopsies of prostate (if applicable) revealed:    Past/Anticipated interventions by urology, if any: prostate biopsy, admission for rectal bleeding following prostate biopsy, referral to Dr. Tammi Klippel  Past/Anticipated interventions by medical oncology, if any: no  Weight changes, if any: no  Bowel/Bladder complaints, if any: ED, hard to postpone urination, leakage of urine (reports wearing one ppd). IPSS 16. Requesting refill of Flomax. Denies dysuria or hematuria. Denies rectal bleeding. Denies any bowel complaints.   Nausea/Vomiting, if any: no  Pain issues, if any:  no  SAFETY ISSUES:  Prior radiation? no   Pacemaker/ICD? no  Possible current pregnancy? no  Is the patient on methotrexate? no  Current Complaints / other details:  71 year old male. Married. BP elevated. Patient reports he hasn't take his bp medication yet. Lives in [place]. Requesting to be treated closer to home. Mother - lung ca. Maternal grandfather - prostate ca. Paternal grandmother - breast.

## 2016-11-06 ENCOUNTER — Ambulatory Visit
Admission: RE | Admit: 2016-11-06 | Discharge: 2016-11-06 | Disposition: A | Discharge status: Home / Self Care | Payer: PPO | Source: Ambulatory Visit | Attending: Radiation Oncology | Admitting: Radiation Oncology

## 2016-11-06 ENCOUNTER — Ambulatory Visit: Payer: PPO

## 2016-11-06 DIAGNOSIS — Z7951 Long term (current) use of inhaled steroids: Secondary | ICD-10-CM | POA: Insufficient documentation

## 2016-11-06 DIAGNOSIS — F17210 Nicotine dependence, cigarettes, uncomplicated: Secondary | ICD-10-CM | POA: Insufficient documentation

## 2016-11-06 DIAGNOSIS — K219 Gastro-esophageal reflux disease without esophagitis: Secondary | ICD-10-CM | POA: Insufficient documentation

## 2016-11-06 DIAGNOSIS — Z8673 Personal history of transient ischemic attack (TIA), and cerebral infarction without residual deficits: Secondary | ICD-10-CM | POA: Insufficient documentation

## 2016-11-06 DIAGNOSIS — Z79899 Other long term (current) drug therapy: Secondary | ICD-10-CM | POA: Insufficient documentation

## 2016-11-06 DIAGNOSIS — E119 Type 2 diabetes mellitus without complications: Secondary | ICD-10-CM | POA: Insufficient documentation

## 2016-11-06 DIAGNOSIS — J449 Chronic obstructive pulmonary disease, unspecified: Secondary | ICD-10-CM | POA: Insufficient documentation

## 2016-11-06 DIAGNOSIS — I509 Heart failure, unspecified: Secondary | ICD-10-CM | POA: Insufficient documentation

## 2016-11-06 DIAGNOSIS — I2510 Atherosclerotic heart disease of native coronary artery without angina pectoris: Secondary | ICD-10-CM | POA: Insufficient documentation

## 2016-11-06 DIAGNOSIS — C61 Malignant neoplasm of prostate: Secondary | ICD-10-CM | POA: Insufficient documentation

## 2016-11-06 DIAGNOSIS — I110 Hypertensive heart disease with heart failure: Secondary | ICD-10-CM | POA: Insufficient documentation

## 2016-11-06 DIAGNOSIS — Z8249 Family history of ischemic heart disease and other diseases of the circulatory system: Secondary | ICD-10-CM | POA: Insufficient documentation

## 2016-11-06 DIAGNOSIS — Z801 Family history of malignant neoplasm of trachea, bronchus and lung: Secondary | ICD-10-CM | POA: Insufficient documentation

## 2016-11-06 DIAGNOSIS — Z7902 Long term (current) use of antithrombotics/antiplatelets: Secondary | ICD-10-CM | POA: Insufficient documentation

## 2016-11-06 DIAGNOSIS — Z794 Long term (current) use of insulin: Secondary | ICD-10-CM | POA: Insufficient documentation

## 2016-11-17 ENCOUNTER — Ambulatory Visit (HOSPITAL_COMMUNITY)
Admission: RE | Admit: 2016-11-17 | Discharge: 2016-11-17 | Disposition: A | Discharge status: Home / Self Care | Payer: PPO | Source: Ambulatory Visit | Attending: Urology | Admitting: Urology

## 2016-11-17 ENCOUNTER — Other Ambulatory Visit: Payer: Self-pay | Admitting: Urology

## 2016-11-17 DIAGNOSIS — C61 Malignant neoplasm of prostate: Secondary | ICD-10-CM

## 2016-11-17 DIAGNOSIS — J849 Interstitial pulmonary disease, unspecified: Secondary | ICD-10-CM | POA: Insufficient documentation

## 2016-11-17 DIAGNOSIS — E119 Type 2 diabetes mellitus without complications: Secondary | ICD-10-CM | POA: Diagnosis not present

## 2016-11-17 DIAGNOSIS — R918 Other nonspecific abnormal finding of lung field: Secondary | ICD-10-CM | POA: Insufficient documentation

## 2016-11-17 DIAGNOSIS — Z951 Presence of aortocoronary bypass graft: Secondary | ICD-10-CM | POA: Diagnosis not present

## 2016-11-17 DIAGNOSIS — R079 Chest pain, unspecified: Secondary | ICD-10-CM | POA: Diagnosis not present

## 2016-11-24 ENCOUNTER — Encounter: Payer: Self-pay | Admitting: Medical Oncology

## 2016-11-24 ENCOUNTER — Ambulatory Visit
Admission: RE | Admit: 2016-11-24 | Discharge: 2016-11-24 | Disposition: A | Discharge status: Home / Self Care | Payer: PPO | Source: Ambulatory Visit | Attending: Radiation Oncology | Admitting: Radiation Oncology

## 2016-11-24 ENCOUNTER — Encounter: Payer: Self-pay | Admitting: Radiation Oncology

## 2016-11-24 DIAGNOSIS — I509 Heart failure, unspecified: Secondary | ICD-10-CM | POA: Diagnosis not present

## 2016-11-24 DIAGNOSIS — K219 Gastro-esophageal reflux disease without esophagitis: Secondary | ICD-10-CM | POA: Diagnosis not present

## 2016-11-24 DIAGNOSIS — Z794 Long term (current) use of insulin: Secondary | ICD-10-CM | POA: Diagnosis not present

## 2016-11-24 DIAGNOSIS — Z79899 Other long term (current) drug therapy: Secondary | ICD-10-CM | POA: Diagnosis not present

## 2016-11-24 DIAGNOSIS — Z801 Family history of malignant neoplasm of trachea, bronchus and lung: Secondary | ICD-10-CM | POA: Diagnosis not present

## 2016-11-24 DIAGNOSIS — Z7902 Long term (current) use of antithrombotics/antiplatelets: Secondary | ICD-10-CM | POA: Diagnosis not present

## 2016-11-24 DIAGNOSIS — I110 Hypertensive heart disease with heart failure: Secondary | ICD-10-CM | POA: Diagnosis not present

## 2016-11-24 DIAGNOSIS — Z7951 Long term (current) use of inhaled steroids: Secondary | ICD-10-CM | POA: Diagnosis not present

## 2016-11-24 DIAGNOSIS — Z8249 Family history of ischemic heart disease and other diseases of the circulatory system: Secondary | ICD-10-CM | POA: Diagnosis not present

## 2016-11-24 DIAGNOSIS — C61 Malignant neoplasm of prostate: Secondary | ICD-10-CM | POA: Insufficient documentation

## 2016-11-24 DIAGNOSIS — F17210 Nicotine dependence, cigarettes, uncomplicated: Secondary | ICD-10-CM | POA: Diagnosis not present

## 2016-11-24 DIAGNOSIS — Z8673 Personal history of transient ischemic attack (TIA), and cerebral infarction without residual deficits: Secondary | ICD-10-CM | POA: Diagnosis not present

## 2016-11-24 DIAGNOSIS — E119 Type 2 diabetes mellitus without complications: Secondary | ICD-10-CM | POA: Diagnosis not present

## 2016-11-24 DIAGNOSIS — J449 Chronic obstructive pulmonary disease, unspecified: Secondary | ICD-10-CM | POA: Diagnosis not present

## 2016-11-24 DIAGNOSIS — I2510 Atherosclerotic heart disease of native coronary artery without angina pectoris: Secondary | ICD-10-CM | POA: Diagnosis not present

## 2016-11-24 HISTORY — DX: Malignant neoplasm of prostate: C61

## 2016-11-24 NOTE — Progress Notes (Signed)
See progress note under physician encounter. 

## 2016-11-24 NOTE — Progress Notes (Signed)
Radiation Oncology         [phone] ________________________________  Initial Outpatient Consultation  Name: [name] MRN: [id number]  Date: 11/24/2016  DOB: 24-Apr-1946  YH:CWCBJSE, Barry Bis, MD  Howard, Barry Furbish, MD   REFERRING PHYSICIAN: Cleon Gustin, MD  DIAGNOSIS: 71 y.o. 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 Malignant neoplasm of prostate ([place]) [place] [facility] referral to [facility] ILLNESS::[name] is a 71 y.o. gentleman.  He was noted to have an elevated PSA of 6.5 and worsening LUTS by his primary care physician, Dr. Baldemar Howard.  Accordingly, he was referred for evaluation in urology by Dr. Alyson Howard on 08/11/16,  digital rectal examination was performed at that time revealing no nodularity with hardening of the right side of the prostate.  The patient proceeded to transrectal ultrasound with 12 biopsies of the prostate on 10/02/16.  The prostate volume measured 33 cc.  Out of 12 core biopsies, 9 were positive.  The maximum Gleason score was 3+4, and this was seen in left base lateral, left mid lateral, left mid, left base, right mid, right base lateral, right mid lateral, and right apex lateral. The biopsy was complicated by post operative bleeding which required sutures with Dr. Excell Howard.  The patient reviewed the biopsy results with his urologist and he has kindly been referred today for discussion of potential radiation treatment options.    PREVIOUS RADIATION THERAPY: No  PAST MEDICAL HISTORY:  has a past medical history of CHF (congestive heart failure) ([place]); COPD (chronic obstructive pulmonary disease) ([place]); Coronary artery disease; Diabetes mellitus without complication ([place]); GERD (gastroesophageal reflux disease); Hypertension; Pneumonia; Prostate cancer ([place]); and Stroke ([place]).    PAST SURGICAL HISTORY: Past Surgical History:    Procedure Laterality Date  . CORONARY ARTERY BYPASS GRAFT  2006  . HEMORRHOID SURGERY N/A 10/02/2016   Procedure: PROCTOSCOPY AND CONTROL OF RECTAL BLEEDING;  Surgeon: Barry Seltzer, MD;  Location: [facility];  Service: General;  Laterality: N/A;  . PROSTATE BIOPSY      FAMILY HISTORY: family history includes Cancer in his maternal grandfather and paternal grandmother; Heart attack in his father; Hypertension in his other; Lung cancer in his mother.  SOCIAL HISTORY:  reports that he has been smoking Cigarettes.  He has a 27.00 pack-year smoking history. He has never used smokeless tobacco. He reports that he does not drink alcohol or use drugs.  ALLERGIES: Patient has no known allergies.  MEDICATIONS:  Current Outpatient Prescriptions  Medication Sig Dispense Refill  . acetaminophen (TYLENOL) 325 MG tablet Take 2 tablets (650 mg total) by mouth every 6 (six) hours as needed for mild pain (or Fever >/= 101).    Marland Kitchen albuterol (PROVENTIL HFA;VENTOLIN HFA) 108 (90 Base) MCG/ACT inhaler Inhale 2 puffs into the lungs every 6 (six) hours as needed for wheezing or shortness of breath.    . clopidogrel (PLAVIX) 75 MG tablet     . Fluticasone-Salmeterol (ADVAIR DISKUS) 250-50 MCG/DOSE AEPB Inhale 1 puff into the lungs 2 (two) times daily. 60 each 0  . furosemide (LASIX) 40 MG tablet Take 40 mg by mouth 2 (two) times daily.    Marland Kitchen gabapentin (NEURONTIN) 100 MG capsule Take 100 mg by mouth as needed.     . hydrocortisone cream 1 % Apply topically 3 (three) times daily. (Patient taking differently: Apply 1 application topically  3 (three) times daily as needed for itching (hemmorhoids). ) 30 g 0  . ipratropium-albuterol (DUONEB) 0.5-2.5 (3) MG/3ML SOLN Inhale 3 mLs into the lungs 4 (four) times daily as needed for shortness of breath or wheezing.    . isosorbide mononitrate (IMDUR) 30 MG 24 hr tablet Take 30 mg by mouth daily.    Marland Kitchen LANTUS SOLOSTAR 100 UNIT/ML Solostar Pen     . lisinopril  (PRINIVIL,ZESTRIL) 40 MG tablet Take 40 mg by mouth daily.    . metFORMIN (GLUCOPHAGE) 1000 MG tablet Take 1,000 mg by mouth 2 (two) times daily with a meal.    . metoprolol (LOPRESSOR) 50 MG tablet Take 50 mg by mouth 2 (two) times daily.    . Multiple Vitamin (MULTIVITAMIN WITH MINERALS) TABS tablet Take 1 tablet by mouth daily.    . multivitamin-lutein (OCUVITE-LUTEIN) CAPS capsule Take 1 capsule by mouth daily.    Marland Kitchen omeprazole (PRILOSEC) 20 MG capsule Take 20 mg by mouth daily as needed (heartburn).     . potassium chloride (K-DUR) 10 MEQ tablet Take 10 mEq by mouth daily.    . simvastatin (ZOCOR) 20 MG tablet Take 20 mg by mouth at bedtime.    . tamsulosin (FLOMAX) 0.4 MG CAPS capsule Take on tab daily    . sacubitril-valsartan (ENTRESTO) 24-26 MG Take 1 tablet by mouth 2 (two) times daily. (Patient not taking: Reported on 10/02/2016) 60 tablet 3   No current facility-administered medications for this encounter.     REVIEW OF SYSTEMS:  A 15 point review of systems is documented in the electronic medical record. This was obtained by the nursing staff. However, I reviewed this with the patient to discuss relevant findings and make appropriate changes.  Pertinent items are noted in HPI..  The patient completed an IPSS and IIEF questionnaire.  His IPSS score was 16 indicating moderate urinary outflow obstructive symptoms.  He indicated that his erectile function is able to complete sexual activity about half of the time.   PHYSICAL EXAM: This patient is in no acute distress.  He is alert and oriented.   height is 5\' 6"  (1.676 m) and weight is 164 lb 9.6 oz (74.7 kg). His oral temperature is 97.6 F (36.4 C). His blood pressure is 168/69 (abnormal) and his pulse is 62. His respiration is 18 and oxygen saturation is 100%.  He exhibits no respiratory distress or labored breathing.  He appears neurologically intact.  His mood is pleasant.  His affect is appropriate.  Please note the digital rectal exam  findings described above.  KPS = 90  100 - Normal; no complaints; no evidence of disease. 90   - Able to carry on normal activity; minor signs or symptoms of disease. 80   - Normal activity with effort; some signs or symptoms of disease. 60   - Cares for self; unable to carry on normal activity or to do active work. 60   - Requires occasional assistance, but is able to care for most of his personal needs. 50   - Requires considerable assistance and frequent medical care. 19   - Disabled; requires special care and assistance. 74   - Severely disabled; hospital admission is indicated although death not imminent. 100   - Very sick; hospital admission necessary; active supportive treatment necessary. 10   - Moribund; fatal processes progressing rapidly. 0     - Dead  Karnofsky DA, Abelmann WH, Craver LS and Burchenal [facility] [phone]) The use of the nitrogen  mustards in the palliative treatment of carcinoma: with particular reference to bronchogenic carcinoma Cancer 1 634-56   LABORATORY DATA:  Lab Results  Component Value Date   WBC 9.3 10/03/2016   HGB 13.5 10/03/2016   HCT 40.3 10/03/2016   MCV 90.8 10/03/2016   PLT 228 10/03/2016   Lab Results  Component Value Date   NA 140 10/02/2016   K 4.9 10/02/2016   CL 104 10/02/2016   CO2 27 10/02/2016   Lab Results  Component Value Date   ALT 14 (L) 01/15/2016   AST 22 01/15/2016   ALKPHOS 88 01/15/2016   BILITOT 1.0 01/15/2016     RADIOGRAPHY: Dg Chest 2 View  Result Date: 11/17/2016 CLINICAL DATA:  Chest pain. EXAM: CHEST  2 VIEW COMPARISON:  01/17/2016 . FINDINGS: Prior CABG. Cardiomegaly with normal pulmonary vascularity. Stable nodular density left apex consistent with granuloma. Stable chronic interstitial thickening. Left base and left lateral pleuroparenchymal thickening consistent with scarring. IMPRESSION: 1. Stable nodular density left apex consistent granuloma. Chronic interstitial lung disease. Stable changes of  pleuroparenchymal scarring. 2. No acute cardiopulmonary disease . 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  His Gleason's Score puts him into the intermediate risk group.  Accordingly he is eligible for a variety of potential treatment options including external beam radiation.  PLAN: Today I reviewed the findings and workup thus far.  We discussed the natural history of prostate cancer.  We reviewed the the implications of T-stage, Gleason's Score, and PSA on decision-making and outcomes in prostate cancer.  We discussed radiation treatment in the management of prostate cancer with regard to the logistics and delivery of external beam radiation treatment as well as the logistics and delivery of prostate brachytherapy.  We compared and contrasted each of these approaches and also compared these against prostatectomy.  The patient expressed interest in external beam radiotherapy.    The patient would like to proceed with prostate IMRT, closer to his [place] home at [facility].  I will share my findings with Dr. Alyson Howard and move forward with scheduling consultation with Dr. Baruch Gouty.  Given his personal history or rectal bleeding after biopsy, I would defer the decision regarding placement of three gold fiducial markers into the prostate versus cone-beam CT guidance to Dr. Baruch Gouty.  I enjoyed meeting with him today, and will look forward to participating in the care of this very nice gentleman.   I spent 30 minutes face to face with the patient and more than 50% of that time was spent in counseling and/or coordination of care.   ------------------------------------------------  Sheral Apley. Tammi Klippel, M.D.  This document serves as a record of services personally performed by Tyler Pita, MD. It was created on his behalf by Bethann Humble, a  trained medical scribe. The creation of this record is based on the scribe's personal observations and the provider's statements to them. This document has been checked and approved by the attending provider.

## 2016-11-25 ENCOUNTER — Telehealth: Payer: Self-pay | Admitting: *Deleted

## 2016-11-25 NOTE — Telephone Encounter (Signed)
CALLED PATIENT TO INFORM OF [place] APPT. FOR 12-04-16 @ 1:30 PM, SPOKE WITH PATIENT AND HE IS AWARE OF THIS APPT.

## 2016-11-25 NOTE — Progress Notes (Signed)
Mr. Foskey is going to get his radiation at [facility] which is nearer to his home.

## 2016-12-01 DIAGNOSIS — C61 Malignant neoplasm of prostate: Secondary | ICD-10-CM | POA: Diagnosis not present

## 2016-12-04 ENCOUNTER — Encounter: Payer: Self-pay | Admitting: Radiation Oncology

## 2016-12-04 ENCOUNTER — Ambulatory Visit
Admission: RE | Admit: 2016-12-04 | Discharge: 2016-12-04 | Disposition: A | Discharge status: Home / Self Care | Payer: PPO | Source: Ambulatory Visit | Attending: Radiation Oncology | Admitting: Radiation Oncology

## 2016-12-04 VITALS — BP 138/77 | HR 61 | Temp 97.8°F | Wt 166.2 lb

## 2016-12-04 DIAGNOSIS — I2510 Atherosclerotic heart disease of native coronary artery without angina pectoris: Secondary | ICD-10-CM | POA: Diagnosis not present

## 2016-12-04 DIAGNOSIS — I209 Angina pectoris, unspecified: Secondary | ICD-10-CM | POA: Insufficient documentation

## 2016-12-04 DIAGNOSIS — C61 Malignant neoplasm of prostate: Secondary | ICD-10-CM | POA: Insufficient documentation

## 2016-12-04 DIAGNOSIS — Z510 Encounter for antineoplastic radiation therapy: Secondary | ICD-10-CM | POA: Insufficient documentation

## 2016-12-04 DIAGNOSIS — Z8673 Personal history of transient ischemic attack (TIA), and cerebral infarction without residual deficits: Secondary | ICD-10-CM | POA: Diagnosis not present

## 2016-12-04 DIAGNOSIS — Z794 Long term (current) use of insulin: Secondary | ICD-10-CM | POA: Insufficient documentation

## 2016-12-04 DIAGNOSIS — E119 Type 2 diabetes mellitus without complications: Secondary | ICD-10-CM | POA: Insufficient documentation

## 2016-12-04 DIAGNOSIS — I10 Essential (primary) hypertension: Secondary | ICD-10-CM | POA: Diagnosis not present

## 2016-12-04 DIAGNOSIS — Z809 Family history of malignant neoplasm, unspecified: Secondary | ICD-10-CM | POA: Insufficient documentation

## 2016-12-04 DIAGNOSIS — J449 Chronic obstructive pulmonary disease, unspecified: Secondary | ICD-10-CM | POA: Diagnosis not present

## 2016-12-04 DIAGNOSIS — K219 Gastro-esophageal reflux disease without esophagitis: Secondary | ICD-10-CM | POA: Diagnosis not present

## 2016-12-04 DIAGNOSIS — Z79899 Other long term (current) drug therapy: Secondary | ICD-10-CM | POA: Insufficient documentation

## 2016-12-04 DIAGNOSIS — F17210 Nicotine dependence, cigarettes, uncomplicated: Secondary | ICD-10-CM | POA: Insufficient documentation

## 2016-12-04 DIAGNOSIS — Z801 Family history of malignant neoplasm of trachea, bronchus and lung: Secondary | ICD-10-CM | POA: Diagnosis not present

## 2016-12-04 DIAGNOSIS — Z8701 Personal history of pneumonia (recurrent): Secondary | ICD-10-CM | POA: Insufficient documentation

## 2016-12-04 NOTE — Consult Note (Signed)
NEW PATIENT EVALUATION  Name: [name]  MRN: [id number]  Date:   12/04/2016     DOB: 02-01-1946   This 71 y.o. male patient presents to the clinic for initial evaluation of adenocarcinoma the prostate stage TII be presenting with a PSA of 6.5 and Gleason score of 7.  REFERRING PHYSICIAN: [name], MD  CHIEF COMPLAINT:  Chief Complaint  Patient presents with  . Prostate Cancer    Initial Evaluation    DIAGNOSIS: The encounter diagnosis was Malignant neoplasm of prostate ([place]).   PREVIOUS INVESTIGATIONS:  Pathology reports reviewed Clinical notes reviewed No films for review  HPI: Patient is a 71 year old male was noted to have a elevated PSA of 6.5. He is this was also encouraged accompanied by increased lower urinary tract symptoms including urgency frequency and nocturia. He did have on rectal exam firmness of the right side of his prostate and underwent transrectal ultrasound-guided biopsy. Prostate measured 33 cc 9 out of 12 cores were positive for Gleason 7 (3+4). Unfortunately his biopsy was complicated by postoperative bleeding which required a suture of the rectum. He has been evaluated by revision oncology in [place] is referred for I M RT radiation therapy closer to home. He is seen today and doing fairly well. He does have some urinary incontinence and does wear depends undergarment. Patient has a significant past medical history including congestive heart failure COPD and adult onset diabetes.  PLANNED TREATMENT REGIMEN: IMRT radiation therapy  PAST MEDICAL HISTORY:  has a past medical history of CHF (congestive heart failure) ([place]); COPD (chronic obstructive pulmonary disease) ([place]); Coronary artery disease; Diabetes mellitus without complication ([place]); GERD (gastroesophageal reflux disease); Hypertension; Pneumonia; Prostate cancer ([place]); and Stroke ([place]).    PAST SURGICAL HISTORY:  Past Surgical History:  Procedure Laterality Date  . CORONARY ARTERY BYPASS  GRAFT  2006  . HEMORRHOID SURGERY N/A 10/02/2016   Procedure: PROCTOSCOPY AND CONTROL OF RECTAL BLEEDING;  Surgeon: Excell Seltzer, MD;  Location: [facility];  Service: General;  Laterality: N/A;  . PROSTATE BIOPSY      FAMILY HISTORY: family history includes Cancer in his maternal grandfather and paternal grandmother; Heart attack in his father; Hypertension in his other; Lung cancer in his mother.  SOCIAL HISTORY:  reports that he has been smoking Cigarettes.  He has a 27.00 pack-year smoking history. He has never used smokeless tobacco. He reports that he does not drink alcohol or use drugs.  ALLERGIES: Patient has no known allergies.  MEDICATIONS:  Current Outpatient Prescriptions  Medication Sig Dispense Refill  . acetaminophen (TYLENOL) 325 MG tablet Take 2 tablets (650 mg total) by mouth every 6 (six) hours as needed for mild pain (or Fever >/= 101).    Marland Kitchen albuterol (PROVENTIL HFA;VENTOLIN HFA) 108 (90 Base) MCG/ACT inhaler Inhale 2 puffs into the lungs every 6 (six) hours as needed for wheezing or shortness of breath.    . clopidogrel (PLAVIX) 75 MG tablet     . Fluticasone-Salmeterol (ADVAIR DISKUS) 250-50 MCG/DOSE AEPB Inhale 1 puff into the lungs 2 (two) times daily. 60 each 0  . furosemide (LASIX) 40 MG tablet Take 40 mg by mouth 2 (two) times daily.    Marland Kitchen gabapentin (NEURONTIN) 100 MG capsule Take 100 mg by mouth as needed.     . hydrocortisone cream 1 % Apply topically 3 (three) times daily. (Patient taking differently: Apply 1 application topically 3 (three) times daily as needed for itching (hemmorhoids). ) 30 g 0  . ipratropium-albuterol (DUONEB) 0.5-2.5 (  3) MG/3ML SOLN Inhale 3 mLs into the lungs 4 (four) times daily as needed for shortness of breath or wheezing.    . isosorbide mononitrate (IMDUR) 30 MG 24 hr tablet Take 30 mg by mouth daily.    Marland Kitchen LANTUS SOLOSTAR 100 UNIT/ML Solostar Pen     . lisinopril (PRINIVIL,ZESTRIL) 40 MG tablet Take 40 mg by mouth daily.    .  metFORMIN (GLUCOPHAGE) 1000 MG tablet Take 1,000 mg by mouth 2 (two) times daily with a meal.    . metoprolol (LOPRESSOR) 50 MG tablet Take 50 mg by mouth 2 (two) times daily.    . Multiple Vitamin (MULTIVITAMIN WITH MINERALS) TABS tablet Take 1 tablet by mouth daily.    . multivitamin-lutein (OCUVITE-LUTEIN) CAPS capsule Take 1 capsule by mouth daily.    Marland Kitchen omeprazole (PRILOSEC) 20 MG capsule Take 20 mg by mouth daily as needed (heartburn).     . potassium chloride (K-DUR) 10 MEQ tablet Take 10 mEq by mouth daily.    . sacubitril-valsartan (ENTRESTO) 24-26 MG Take 1 tablet by mouth 2 (two) times daily. (Patient not taking: Reported on 10/02/2016) 60 tablet 3  . simvastatin (ZOCOR) 20 MG tablet Take 20 mg by mouth at bedtime.    . tamsulosin (FLOMAX) 0.4 MG CAPS capsule Take on tab daily     No current facility-administered medications for this encounter.     ECOG PERFORMANCE STATUS:  1 - Symptomatic but completely ambulatory  REVIEW OF SYSTEMS:  Patient denies any weight loss, fatigue, weakness, fever, chills or night sweats. Patient denies any loss of vision, blurred vision. Patient denies any ringing  of the ears or hearing loss. No irregular heartbeat. Patient denies heart murmur or history of fainting. Patient denies any chest pain or pain radiating to her upper extremities. Patient denies any shortness of breath, difficulty breathing at night, cough or hemoptysis. Patient denies any swelling in the lower legs. Patient denies any nausea vomiting, vomiting of blood, or coffee ground material in the vomitus. Patient denies any stomach pain. Patient states has had normal bowel movements no significant constipation or diarrhea. Patient denies any dysuria, hematuria or significant nocturia. Patient denies any problems walking, swelling in the joints or loss of balance. Patient denies any skin changes, loss of hair or loss of weight. Patient denies any excessive worrying or anxiety or significant  depression. Patient denies any problems with insomnia. Patient denies excessive thirst, polyuria, polydipsia. Patient denies any swollen glands, patient denies easy bruising or easy bleeding. Patient denies any recent infections, allergies or URI. Patient "s visual fields have not changed significantly in recent time.    PHYSICAL EXAM: BP 138/77   Pulse 61   Temp 97.8 F (36.6 C)   Wt 166 lb 3.6 oz (75.4 kg)   BMI 26.83 kg/m  On rectal exam by prostate is firm. No discrete nodularity is noted sulcus is preserved bilaterally. Seminal vesicle region appears normal. Rectal exam is otherwise unremarkable.  Well-developed well-nourished patient in NAD. HEENT reveals PERLA, EOMI, discs not visualized.  Oral cavity is clear. No oral mucosal lesions are identified. Neck is clear without evidence of cervical or supraclavicular adenopathy. Lungs are clear to A&P. Cardiac examination is essentially unremarkable with regular rate and rhythm without murmur rub or thrill. Abdomen is benign with no organomegaly or masses noted. Motor sensory and DTR levels are equal and symmetric in the upper and lower extremities. Cranial nerves II through XII are grossly intact. Proprioception is intact. No peripheral adenopathy  or edema is identified. No motor or sensory levels are noted. Crude visual fields are within normal range.  LABORATORY DATA: Pathology reports reviewed    RADIOLOGY RESULTS: Bone scan was performed showing an isolated focus of activity involving the left fourth rib posteriorly doubt this is a metastatic focus based on his PSA   IMPRESSION: Stage TII be adenocarcinoma the prostate in 71 year old male with multiple comorbidities for image guided I MRT radiation therapy.  PLAN: I discussed with urology placing fiduciary markers in his prostate with a risk of prior rectal bleed at the time of prostate biopsy. Dr. Erlene Quan will evaluate the patient and possibly discuss placing markers in the OR for image  guided treatment. If she cannot place markers will treat the patient with daily cone beam verification. Risks and benefits of treatment including increased lower urinary tract symptoms fatigue diarrhea skin reaction all were discussed in detail with the patient and his family. I have set up CT simulation after his markers are placed or will move that up should his markers not be able to be placed. Patient family know to call with any concerns.  I would like to take this opportunity to thank you for allowing me to participate in the care of your patient.Armstead Peaks., MD

## 2016-12-08 DIAGNOSIS — H26499 Other secondary cataract, unspecified eye: Secondary | ICD-10-CM | POA: Diagnosis not present

## 2016-12-11 ENCOUNTER — Ambulatory Visit: Payer: PPO | Admitting: Urology

## 2016-12-11 ENCOUNTER — Encounter: Payer: Self-pay | Admitting: Urology

## 2016-12-11 VITALS — BP 205/75 | HR 75 | Ht 66.0 in | Wt 165.0 lb

## 2016-12-11 DIAGNOSIS — K625 Hemorrhage of anus and rectum: Secondary | ICD-10-CM

## 2016-12-11 DIAGNOSIS — I5022 Chronic systolic (congestive) heart failure: Secondary | ICD-10-CM | POA: Diagnosis not present

## 2016-12-11 DIAGNOSIS — C61 Malignant neoplasm of prostate: Secondary | ICD-10-CM | POA: Diagnosis not present

## 2016-12-12 NOTE — Progress Notes (Signed)
12/11/2016 9:25 AM   [name] May 04, 1946 [id number]  Referring provider: [name], MD [phone] [facility] [place], [place] [address]  Chief Complaint  Patient presents with  . Prostate Cancer    New Patient    HPI: 71 year old male referred by Dr. Noreene Filbert for placement of fiducial seed markers in anticipation of external beam radiation. [name] was diagnosed with Gleason 3+4 prostate cancer by Dr. Noah Delaine with an elevated PSA of 6.5 and hardening of the right side of the prostate on digital rectal exam. Prostate biopsy was performed on 10/02/2016, TRUS volume 33 cc. He had 9 of 12 cores positive with maximal Gleason score of 3+4.  His prostate biopsy was complicated by severe rectal bleeding requiring emergent surgical intervention by Dr. Excell Seltzer at which time bleeding internal hemorrhoids were oversewn in the operating room. Report, he lost over liter of blood.  He does have a personal history of heart attack and stroke. He is currently on aspirin and Plavix. He received neurological and cardiac clearance previously to stop his anti-platelet therapy prior to his prostate biopsy.   PMH: Past Medical History:  Diagnosis Date  . CHF (congestive heart failure) ([place])   . COPD (chronic obstructive pulmonary disease) ([place])   . Coronary artery disease   . Diabetes mellitus without complication ([place])   . GERD (gastroesophageal reflux disease)   . Hypertension   . Pneumonia   . Prostate cancer ([place])   . Stroke [facility])     Surgical History: Past Surgical History:  Procedure Laterality Date  . CORONARY ARTERY BYPASS GRAFT  2006  . HEMORRHOID SURGERY N/A 10/02/2016   Procedure: PROCTOSCOPY AND CONTROL OF RECTAL BLEEDING;  Surgeon: Excell Seltzer, MD;  Location: [facility];  Service: General;  Laterality: N/A;  . PROSTATE BIOPSY      Home Medications:  Allergies as of 12/11/2016   No Known Allergies     Medication  List       Accurate as of 12/11/16 11:59 PM. Always use your most recent med list.          acetaminophen 325 MG tablet Commonly known as:  TYLENOL Take 2 tablets (650 mg total) by mouth every 6 (six) hours as needed for mild pain (or Fever >/= 101).   albuterol 108 (90 Base) MCG/ACT inhaler Commonly known as:  PROVENTIL HFA;VENTOLIN HFA Inhale 2 puffs into the lungs every 6 (six) hours as needed for wheezing or shortness of breath.   clopidogrel 75 MG tablet Commonly known as:  PLAVIX   Fluticasone-Salmeterol 250-50 MCG/DOSE Aepb Commonly known as:  ADVAIR DISKUS Inhale 1 puff into the lungs 2 (two) times daily.   furosemide 40 MG tablet Commonly known as:  LASIX Take 40 mg by mouth 2 (two) times daily.   gabapentin 100 MG capsule Commonly known as:  NEURONTIN Take 100 mg by mouth as needed.   hydrocortisone cream 1 % Apply topically 3 (three) times daily.   ipratropium-albuterol 0.5-2.5 (3) MG/3ML Soln Commonly known as:  DUONEB Inhale 3 mLs into the lungs 4 (four) times daily as needed for shortness of breath or wheezing.   isosorbide mononitrate 30 MG 24 hr tablet Commonly known as:  IMDUR Take 30 mg by mouth daily.   LANTUS SOLOSTAR 100 UNIT/ML Solostar Pen Generic drug:  Insulin Glargine   lisinopril 40 MG tablet Commonly known as:  PRINIVIL,ZESTRIL Take 40 mg by mouth daily.   metFORMIN 1000 MG tablet  Commonly known as:  GLUCOPHAGE Take 1,000 mg by mouth 2 (two) times daily with a meal.   metoprolol 50 MG tablet Commonly known as:  LOPRESSOR Take 50 mg by mouth 2 (two) times daily.   multivitamin with minerals Tabs tablet Take 1 tablet by mouth daily.   multivitamin-lutein Caps capsule Take 1 capsule by mouth daily.   omeprazole 20 MG capsule Commonly known as:  PRILOSEC Take 20 mg by mouth daily as needed (heartburn).   potassium chloride 10 MEQ tablet Commonly known as:  K-DUR Take 10 mEq by mouth daily.   sacubitril-valsartan 24-26  MG Commonly known as:  ENTRESTO Take 1 tablet by mouth 2 (two) times daily.   simvastatin 20 MG tablet Commonly known as:  ZOCOR Take 20 mg by mouth at bedtime.   tamsulosin 0.4 MG Caps capsule Commonly known as:  FLOMAX Take on tab daily       Allergies: No Known Allergies  Family History: Family History  Problem Relation Age of Onset  . Lung cancer Mother   . Heart attack Father   . Hypertension Other   . Cancer Maternal Grandfather     prostate  . Cancer Paternal Grandmother     breast    Social History:  reports that he has been smoking Cigarettes.  He has a 27.00 pack-year smoking history. He has never used smokeless tobacco. He reports that he does not drink alcohol or use drugs.  ROS: UROLOGY Frequent Urination?: Yes Hard to postpone urination?: Yes Burning/pain with urination?: No Get up at night to urinate?: Yes Leakage of urine?: Yes Urine stream starts and stops?: No Trouble starting stream?: No Do you have to strain to urinate?: No Blood in urine?: No Urinary tract infection?: No Sexually transmitted disease?: No Injury to kidneys or bladder?: No Painful intercourse?: No Weak stream?: No Erection problems?: Yes Penile pain?: No  Gastrointestinal Nausea?: No Vomiting?: No Indigestion/heartburn?: Yes Diarrhea?: No Constipation?: No  Constitutional Fever: No Night sweats?: No Weight loss?: No Fatigue?: No  Skin Skin rash/lesions?: No Itching?: No  Eyes Blurred vision?: No Double vision?: No  Ears/Nose/Throat Sore throat?: No Sinus problems?: No  Hematologic/Lymphatic Swollen glands?: No Easy bruising?: Yes  Cardiovascular Leg swelling?: No Chest pain?: No  Respiratory Cough?: No Shortness of breath?: Yes  Endocrine Excessive thirst?: No  Musculoskeletal Back pain?: No Joint pain?: No  Neurological Headaches?: No Dizziness?: No  Psychologic Depression?: No Anxiety?: No  Physical Exam: BP (!) 205/75   Pulse  75   Ht 5\' 6"  (1.676 m)   Wt 165 lb (74.8 kg)   BMI 26.63 kg/m   Constitutional:  Alert and oriented, No acute distress. Accompanied by family today. HEENT: [place] AT, moist mucus membranes.  Trachea midline, no masses. Cardiovascular: No clubbing, cyanosis, or edema. Respiratory: Normal respiratory effort, no increased work of breathing. GI: Abdomen is soft, nontender, nondistended, no abdominal masses GU: No CVA tenderness.  Skin: No rashes, bruises or suspicious lesions. Neurologic: Grossly intact, no focal deficits, moving all 4 extremities. Psychiatric: Normal mood and affect.  Laboratory Data: Lab Results  Component Value Date   WBC 9.3 10/03/2016   HGB 13.5 10/03/2016   HCT 40.3 10/03/2016   MCV 90.8 10/03/2016   PLT 228 10/03/2016    Lab Results  Component Value Date   CREATININE 1.70 (H) 10/02/2016     Lab Results  Component Value Date   HGBA1C 9.3 (H) 12/04/2015    Urinalysis    Component Value Date/Time  COLORURINE YELLOW (A) 01/15/2016 2115   APPEARANCEUR CLEAR (A) 01/15/2016 2115   APPEARANCEUR Clear 04/28/2014 2346   LABSPEC 1.010 01/15/2016 2115   LABSPEC 1.009 04/28/2014 2346   PHURINE 5.0 01/15/2016 2115   GLUCOSEU NEGATIVE 01/15/2016 2115   GLUCOSEU Negative 04/28/2014 2346   HGBUR 1+ (A) 01/15/2016 2115   BILIRUBINUR NEGATIVE 01/15/2016 2115   BILIRUBINUR Negative 04/28/2014 2346   [place] 01/15/2016 2115   PROTEINUR 100 (A) 01/15/2016 2115   NITRITE NEGATIVE 01/15/2016 2115   LEUKOCYTESUR TRACE (A) 01/15/2016 2115   LEUKOCYTESUR 1+ 04/28/2014 2346    Pertinent Imaging: n/a  Assessment & Plan:    1. Prostate cancer ([place]) Intermediate risk prostate cancer scheduled for external beam radiation Given his history of severe life-threatening rectal bleeding following prostate biopsy, I'm very hesitant to place fiducial seed markers transrectally We discussed the option of transperineal fiducial seed markers to avoid internal  hemorrhoids and reduce risk of bleeding This would be best done in the operating room with sedation/anesthesia  Case was discussed with Dr. Noreene Filbert who as aggreed with this plan and willing to assist Risks and benefits were reviewed in detail including risk of bleeding, infection, pain, damage to any structures, amongst others. He has family are agreeable to this plan and willing to proceed.  2. Bright red rectal bleeding As above, high risk for bleeding via transrectal approach  3. Chronic systolic heart failure (HCC) We'll need to confirm cardiac clearance in order to hold aspirin and Plavix Given his episode of severe profound bleeding in the past, recommend holding these agents for 7 days prior to procedure  Schedule transperineal fiducial culture markers in the operating room   Hollice Espy, MD  [facility] [address], [place], [place] [address] [phone]

## 2016-12-15 ENCOUNTER — Telehealth: Payer: Self-pay | Admitting: Radiology

## 2016-12-15 ENCOUNTER — Other Ambulatory Visit: Payer: Self-pay | Admitting: Radiology

## 2016-12-15 NOTE — Telephone Encounter (Signed)
Notified pt of gold seed placement in OR on 12/30/16, pre-admit testing appt on 12/19/16 @10 :30 & to call day prior to procedure for arrival time to SDS. Advised pt to hold ASA 81mg  & Plavix beginning 12/23/16 per clearance obtained from Dr Ubaldo Glassing. Pt voices understanding.

## 2016-12-16 ENCOUNTER — Other Ambulatory Visit: Payer: Self-pay | Admitting: Radiology

## 2016-12-16 DIAGNOSIS — C61 Malignant neoplasm of prostate: Secondary | ICD-10-CM

## 2016-12-19 ENCOUNTER — Encounter
Admission: RE | Admit: 2016-12-19 | Discharge: 2016-12-19 | Disposition: A | Discharge status: Home / Self Care | Payer: PPO | Source: Ambulatory Visit | Attending: Urology | Admitting: Urology

## 2016-12-19 DIAGNOSIS — C61 Malignant neoplasm of prostate: Secondary | ICD-10-CM | POA: Insufficient documentation

## 2016-12-19 DIAGNOSIS — Z01812 Encounter for preprocedural laboratory examination: Secondary | ICD-10-CM | POA: Diagnosis not present

## 2016-12-19 HISTORY — DX: Peripheral vascular disease, unspecified: I73.9

## 2016-12-19 HISTORY — DX: Hyperlipidemia, unspecified: E78.5

## 2016-12-19 LAB — BASIC METABOLIC PANEL
ANION GAP: 12 (ref 5–15)
BUN: 24 mg/dL — ABNORMAL HIGH (ref 6–20)
CO2: 26 mmol/L (ref 22–32)
Calcium: 8.9 mg/dL (ref 8.9–10.3)
Chloride: 100 mmol/L — ABNORMAL LOW (ref 101–111)
Creatinine, Ser: 1.8 mg/dL — ABNORMAL HIGH (ref 0.61–1.24)
GFR calc Af Amer: 42 mL/min — ABNORMAL LOW (ref >60)
GFR calc non Af Amer: 36 mL/min — ABNORMAL LOW (ref >60)
Glucose, Bld: 209 mg/dL — ABNORMAL HIGH (ref 65–99)
POTASSIUM: 4.2 mmol/L (ref 3.5–5.1)
SODIUM: 138 mmol/L (ref 135–145)

## 2016-12-19 LAB — CBC
HEMATOCRIT: 45 % (ref 40.0–52.0)
HEMOGLOBIN: 15.4 g/dL (ref 13.0–18.0)
MCH: 30.1 pg (ref 26.0–34.0)
MCHC: 34.3 g/dL (ref 32.0–36.0)
MCV: 87.7 fL (ref 80.0–100.0)
Platelets: 214 10*3/uL (ref 150–440)
RBC: 5.13 MIL/uL (ref 4.40–5.90)
RDW: 15.1 % — AB (ref 11.5–14.5)
WBC: 8.3 10*3/uL (ref 3.8–10.6)

## 2016-12-19 NOTE — Pre-Procedure Instructions (Signed)
Patient stated he was incontinent of urine and stool at times.

## 2016-12-19 NOTE — Patient Instructions (Addendum)
Your procedure is scheduled on: Tuesday 12/30/16 Report to [facility]. 2ND FLOOR MEDICAL MALL ENTRANCE. To find out your arrival time please call [phone] between 1PM - 3PM on Monday 12/29/16.  Remember: Instructions that are not followed completely may result in serious medical risk, up to and including death, or upon the discretion of your surgeon and anesthesiologist your surgery may need to be rescheduled.    __X__ 1. Do not eat food or drink liquids after midnight. No gum chewing or hard candies.     __X__ 2. No Alcohol for 24 hours before or after surgery.   ____ 3. Bring all medications with you on the day of surgery if instructed.    __X__ 4. Notify your doctor if there is any change in your medical condition     (cold, fever, infections).             __X___5. No smoking within 24 hours of your surgery.     Do not wear jewelry, make-up, hairpins, clips or nail polish.  Do not wear lotions, powders, or perfumes.   Do not shave 48 hours prior to surgery. Men may shave face and neck.  Do not bring valuables to the hospital.    [facility] is not responsible for any belongings or valuables.               Contacts, dentures or bridgework may not be worn into surgery.  Leave your suitcase in the car. After surgery it may be brought to your room.  For patients admitted to the hospital, discharge time is determined by your                treatment team.   Patients discharged the day of surgery will not be allowed to drive home.   Please read over the following fact sheets that you were given:   MRSA Information   __X__ Take these medicines the morning of surgery with A SIP OF WATER:    1. ISOSORBIDE  2. LISINOPRIL  3. MAGNESIUM  4. METOPROLOL  5. OMEPRAZOLE  6.   __X__ Fleet Enema (as directed)   ____ Use CHG Soap as directed  __X__ Use inhalers on the day of surgery AND NEBULIZER BEFORE ARRIVAL TO SURGERY  __X__ Stop metformin 2 days prior to surgery    __X__  Take 1/2 of usual insulin dose the night before surgery and none on the morning of surgery. TAKE 25 UNITS AT BEDTIME NIGHT BEFORE SURGERY ONLY  __X__ Stop Coumadin/Plavix/aspirin on 12/23/16 AS INSTRUCTED BY DR Erlene Quan  __X__ Stop Anti-inflammatories such as Advil, Aleve, Ibuprofen, Motrin, Naproxen, Naprosyn, Goodies,powder, or aspirin products.  OK to take Tylenol.   __X__ Stop supplements until after surgery. VITAMIN C AND OCUVITE  ____ Bring C-Pap to the hospital.

## 2016-12-19 NOTE — Pre-Procedure Instructions (Signed)
Component Name Value Ref Range  Vent Rate (bpm) 58   PR Interval (msec) 360   QRS Interval (msec) 106   QT Interval (msec) 424   QTc (msec) 416   Result Narrative  Sinus bradycardia with marked sinus arrhythmia 1st degree AV block with occasional premature ventricular complexes  ST and T wave abnormality, consider inferolateral ischemia Abnormal ECG When compared with ECG of 23-Oct-2015 10:27, premature ventricular complexes are now present premature supraventricular complexes are no longer present QT has shortened I reviewed and concur with this report. Electronically signed QP:RFFM MD, KEN [phone]) on 10/29/2016 3:24:26 PM

## 2016-12-30 ENCOUNTER — Ambulatory Visit
Admission: RE | Admit: 2016-12-30 | Discharge: 2016-12-30 | Disposition: A | Discharge status: Home / Self Care | Payer: PPO | Source: Ambulatory Visit | Attending: Urology | Admitting: Urology

## 2016-12-30 ENCOUNTER — Encounter
Admission: RE | Disposition: A | Discharge status: Home / Self Care | Payer: Self-pay | Source: Ambulatory Visit | Attending: Urology

## 2016-12-30 ENCOUNTER — Other Ambulatory Visit: Payer: Self-pay | Admitting: Urology

## 2016-12-30 ENCOUNTER — Inpatient Hospital Stay: Payer: PPO | Admitting: Registered Nurse

## 2016-12-30 ENCOUNTER — Encounter: Payer: Self-pay | Admitting: *Deleted

## 2016-12-30 ENCOUNTER — Ambulatory Visit
Admission: RE | Admit: 2016-12-30 | Discharge: 2016-12-30 | Disposition: A | Discharge status: Home / Self Care | Payer: PPO | Source: Ambulatory Visit | Attending: Radiation Oncology | Admitting: Radiation Oncology

## 2016-12-30 DIAGNOSIS — Z79899 Other long term (current) drug therapy: Secondary | ICD-10-CM | POA: Insufficient documentation

## 2016-12-30 DIAGNOSIS — K219 Gastro-esophageal reflux disease without esophagitis: Secondary | ICD-10-CM | POA: Diagnosis not present

## 2016-12-30 DIAGNOSIS — E785 Hyperlipidemia, unspecified: Secondary | ICD-10-CM | POA: Diagnosis not present

## 2016-12-30 DIAGNOSIS — I2510 Atherosclerotic heart disease of native coronary artery without angina pectoris: Secondary | ICD-10-CM | POA: Diagnosis not present

## 2016-12-30 DIAGNOSIS — F17210 Nicotine dependence, cigarettes, uncomplicated: Secondary | ICD-10-CM | POA: Insufficient documentation

## 2016-12-30 DIAGNOSIS — J449 Chronic obstructive pulmonary disease, unspecified: Secondary | ICD-10-CM | POA: Insufficient documentation

## 2016-12-30 DIAGNOSIS — I5022 Chronic systolic (congestive) heart failure: Secondary | ICD-10-CM | POA: Insufficient documentation

## 2016-12-30 DIAGNOSIS — Z7982 Long term (current) use of aspirin: Secondary | ICD-10-CM | POA: Insufficient documentation

## 2016-12-30 DIAGNOSIS — I252 Old myocardial infarction: Secondary | ICD-10-CM | POA: Diagnosis not present

## 2016-12-30 DIAGNOSIS — E1151 Type 2 diabetes mellitus with diabetic peripheral angiopathy without gangrene: Secondary | ICD-10-CM | POA: Diagnosis not present

## 2016-12-30 DIAGNOSIS — Z8673 Personal history of transient ischemic attack (TIA), and cerebral infarction without residual deficits: Secondary | ICD-10-CM | POA: Diagnosis not present

## 2016-12-30 DIAGNOSIS — C61 Malignant neoplasm of prostate: Secondary | ICD-10-CM | POA: Insufficient documentation

## 2016-12-30 DIAGNOSIS — Z7902 Long term (current) use of antithrombotics/antiplatelets: Secondary | ICD-10-CM | POA: Insufficient documentation

## 2016-12-30 DIAGNOSIS — I110 Hypertensive heart disease with heart failure: Secondary | ICD-10-CM | POA: Insufficient documentation

## 2016-12-30 DIAGNOSIS — Z951 Presence of aortocoronary bypass graft: Secondary | ICD-10-CM | POA: Insufficient documentation

## 2016-12-30 HISTORY — PX: GOLD SEED IMPLANT: SHX6343

## 2016-12-30 LAB — GLUCOSE, CAPILLARY
GLUCOSE-CAPILLARY: 167 mg/dL — AB (ref 65–99)
Glucose-Capillary: 196 mg/dL — ABNORMAL HIGH (ref 65–99)

## 2016-12-30 SURGERY — INSERTION, GOLD SEEDS
Anesthesia: General | Site: Scrotum | Wound class: Clean Contaminated

## 2016-12-30 MED ORDER — GENTAMICIN IN SALINE 1.6-0.9 MG/ML-% IV SOLN
80.0000 mg | INTRAVENOUS | Status: DC
  Filled 2016-12-30: qty 50

## 2016-12-30 MED ORDER — SUCCINYLCHOLINE CHLORIDE 20 MG/ML IJ SOLN
INTRAMUSCULAR | Status: AC
  Filled 2016-12-30: qty 1

## 2016-12-30 MED ORDER — FENTANYL CITRATE (PF) 100 MCG/2ML IJ SOLN
INTRAMUSCULAR | Status: DC | PRN
  Administered 2016-12-30 (×2): 25 ug via INTRAVENOUS

## 2016-12-30 MED ORDER — CIPROFLOXACIN IN D5W 400 MG/200ML IV SOLN
INTRAVENOUS | Status: AC
  Administered 2016-12-30: 400 mg via INTRAVENOUS
  Filled 2016-12-30: qty 200

## 2016-12-30 MED ORDER — MIDAZOLAM HCL 2 MG/2ML IJ SOLN
INTRAMUSCULAR | Status: AC
  Filled 2016-12-30: qty 2

## 2016-12-30 MED ORDER — ONDANSETRON HCL 4 MG/2ML IJ SOLN
INTRAMUSCULAR | Status: DC | PRN
  Administered 2016-12-30: 4 mg via INTRAVENOUS

## 2016-12-30 MED ORDER — LIDOCAINE HCL (PF) 2 % IJ SOLN
INTRAMUSCULAR | Status: AC
  Filled 2016-12-30: qty 2

## 2016-12-30 MED ORDER — ONDANSETRON HCL 4 MG/2ML IJ SOLN
INTRAMUSCULAR | Status: AC
  Filled 2016-12-30: qty 2

## 2016-12-30 MED ORDER — GENTAMICIN SULFATE 40 MG/ML IJ SOLN
80.0000 mg | INTRAVENOUS | Status: AC
  Administered 2016-12-30: 80 mg via INTRAVENOUS
  Filled 2016-12-30: qty 2

## 2016-12-30 MED ORDER — FLEET ENEMA 7-19 GM/118ML RE ENEM
1.0000 | ENEMA | Freq: Once | RECTAL | Status: AC
  Administered 2016-12-30: 1 via RECTAL

## 2016-12-30 MED ORDER — BACITRACIN ZINC 500 UNIT/GM EX OINT
TOPICAL_OINTMENT | CUTANEOUS | Status: AC
  Filled 2016-12-30: qty 28.35

## 2016-12-30 MED ORDER — SODIUM CHLORIDE 0.9 % IV SOLN
INTRAVENOUS | Status: DC
  Administered 2016-12-30: 07:00:00 via INTRAVENOUS

## 2016-12-30 MED ORDER — MIDAZOLAM HCL 2 MG/2ML IJ SOLN
INTRAMUSCULAR | Status: DC | PRN
  Administered 2016-12-30: 2 mg via INTRAVENOUS

## 2016-12-30 MED ORDER — SODIUM CHLORIDE 0.9 % IJ SOLN
INTRAMUSCULAR | Status: AC
  Filled 2016-12-30: qty 10

## 2016-12-30 MED ORDER — ONDANSETRON HCL 4 MG/2ML IJ SOLN: 4.0000 mg | Freq: Once | INTRAMUSCULAR | Status: DC | PRN

## 2016-12-30 MED ORDER — EPHEDRINE SULFATE 50 MG/ML IJ SOLN
INTRAMUSCULAR | Status: AC
  Filled 2016-12-30: qty 1

## 2016-12-30 MED ORDER — GLYCOPYRROLATE 0.2 MG/ML IJ SOLN
INTRAMUSCULAR | Status: AC
  Filled 2016-12-30: qty 1

## 2016-12-30 MED ORDER — CIPROFLOXACIN IN D5W 400 MG/200ML IV SOLN
400.0000 mg | INTRAVENOUS | Status: AC
  Administered 2016-12-30: 400 mg via INTRAVENOUS

## 2016-12-30 MED ORDER — PROPOFOL 10 MG/ML IV BOLUS
INTRAVENOUS | Status: AC
  Filled 2016-12-30: qty 20

## 2016-12-30 MED ORDER — LIDOCAINE HCL (CARDIAC) 20 MG/ML IV SOLN
INTRAVENOUS | Status: DC | PRN
  Administered 2016-12-30: 80 mg via INTRAVENOUS

## 2016-12-30 MED ORDER — FENTANYL CITRATE (PF) 100 MCG/2ML IJ SOLN
INTRAMUSCULAR | Status: AC
  Filled 2016-12-30: qty 2

## 2016-12-30 MED ORDER — FENTANYL CITRATE (PF) 100 MCG/2ML IJ SOLN: 25.0000 ug | INTRAMUSCULAR | Status: DC | PRN

## 2016-12-30 MED ORDER — BACITRACIN 500 UNIT/GM EX OINT
TOPICAL_OINTMENT | CUTANEOUS | Status: DC | PRN
  Administered 2016-12-30: 1 via TOPICAL

## 2016-12-30 MED ORDER — PROPOFOL 10 MG/ML IV BOLUS
INTRAVENOUS | Status: DC | PRN
  Administered 2016-12-30: 150 mg via INTRAVENOUS
  Administered 2016-12-30: 50 mg via INTRAVENOUS

## 2016-12-30 SURGICAL SUPPLY — 33 items
BAG URO DRAIN 2000ML W/SPOUT (MISCELLANEOUS) IMPLANT
BLADE CLIPPER SURG (BLADE) IMPLANT
CATH FOL 2WAY LX 16X5 (CATHETERS) IMPLANT
CATH ROBINSON RED A/P 16FR (CATHETERS) IMPLANT
DRAPE INCISE 23X17 IOBAN STRL (DRAPES) ×2
DRAPE INCISE IOBAN 23X17 STRL (DRAPES) ×1 IMPLANT
DRAPE SHEET LG 3/4 BI-LAMINATE (DRAPES) ×3 IMPLANT
DRAPE TABLE BACK 80X90 (DRAPES) ×3 IMPLANT
DRAPE UNDER BUTTOCK W/FLU (DRAPES) ×3 IMPLANT
DRSG TELFA 3X8 NADH (GAUZE/BANDAGES/DRESSINGS) ×3 IMPLANT
GLOVE BIO SURGEON STRL SZ 6.5 (GLOVE) ×4 IMPLANT
GLOVE BIO SURGEON STRL SZ7 (GLOVE) ×6 IMPLANT
GLOVE BIO SURGEON STRL SZ7.5 (GLOVE) ×6 IMPLANT
GLOVE BIO SURGEONS STRL SZ 6.5 (GLOVE) ×2
GOWN STRL REUS W/ TWL LRG LVL3 (GOWN DISPOSABLE) ×2 IMPLANT
GOWN STRL REUS W/ TWL XL LVL3 (GOWN DISPOSABLE) ×1 IMPLANT
GOWN STRL REUS W/TWL LRG LVL3 (GOWN DISPOSABLE) ×4
GOWN STRL REUS W/TWL XL LVL3 (GOWN DISPOSABLE) ×2
GRADUATE 1200CC STRL 31836 (MISCELLANEOUS) ×3 IMPLANT
IV NS 1000ML (IV SOLUTION)
IV NS 1000ML BAXH (IV SOLUTION) IMPLANT
KIT RM TURNOVER CYSTO AR (KITS) ×3 IMPLANT
PACK CYSTO AR (MISCELLANEOUS) ×3 IMPLANT
PAD PREP 24X41 OB/GYN DISP (PERSONAL CARE ITEMS) ×3 IMPLANT
SET CYSTO W/LG BORE CLAMP LF (SET/KITS/TRAYS/PACK) IMPLANT
SOL PREP PVP 2OZ (MISCELLANEOUS) ×3
SOLUTION PREP PVP 2OZ (MISCELLANEOUS) ×1 IMPLANT
SURGILUBE 2OZ TUBE FLIPTOP (MISCELLANEOUS) ×3 IMPLANT
SYRINGE 10CC LL (SYRINGE) ×3 IMPLANT
SYRINGE IRR TOOMEY STRL 70CC (SYRINGE) IMPLANT
TRAY FOLEY SILVER 14FR TEMP (SET/KITS/TRAYS/PACK) ×3 IMPLANT
WATER STERILE IRR 1000ML POUR (IV SOLUTION) ×3 IMPLANT
WATER STERILE IRR 3000ML UROMA (IV SOLUTION) ×3 IMPLANT

## 2016-12-30 NOTE — Interval H&P Note (Signed)
History and Physical Interval Note:  12/30/2016 7:32 AM  [name]  has presented today for surgery, with the diagnosis of prostate cancer  The various methods of treatment have been discussed with the patient and family. After consideration of risks, benefits and other options for treatment, the patient has consented to  Procedure(s): GOLD SEED IMPLANT (N/A) as a surgical intervention .  The patient's history has been reviewed, patient examined, no change in status, stable for surgery.  I have reviewed the patient's chart and labs.  Questions were answered to the patient's satisfaction.    RRR CTAB   Hollice Espy

## 2016-12-30 NOTE — Discharge Instructions (Signed)
  Transrectal Prostate Biopsy Patient Education and Post Procedure Instructions    -Definition A prostate biopsy is the removal of a small amount of tissue from the prostate gland. The tissue is examined to determine whether there is cancer.  -Reasons for Procedure A prostate biopsy is usually done after an abnormal finding by: Digital rectal exam Prostate specific antigen (PSA) blood test A prostate biopsy is the only way to find out if cancer cells are present.  -Possible Complications Problems from the procedure are rare, but all procedures have some risk including: Infection Bruising or lengthy bleeding from the rectum, or in urine or semen Difficulty urinating Reactions to anesthesia Factors that may increase the risk of complications include: Smoking History of bleeding disorders or easy bruising Use of any medications, over-the-counter medications, or herbal supplements Sensitivity or allergy to latex, medications, or anesthesia.  -Prior to Procedure Talk to your doctor about your medications. Blood thinning medications including aspirin should be stopped 1 week prior to procedure. If prescribed by your cardiologist we may need approval before stopping medications. Use a Fleets enema 2 hours before the procedure. Can be purchased at your pharmacy. Antibiotics will be administered in the clinic prior to procedure.  Please make sure you eat a light meal prior to coming in for your appointment. This can help prevent lightheadedness during the procedure and upset stomach from antibiotics. Please bring someone with you to the procedure to drive you home.  -Anesthesia Transrectal biopsy: Local anesthesia--Just the area that is being operated on is numbed using an injectable anesthetic.  -Description of the Procedure Transrectal biopsy--Your doctor will insert a small ultrasound device into the rectum. This device will produce sound waves to create an image of the prostate.  These images will help guide placement of the needle. Your doctor will then insert the needle through the wall of the rectum and into the prostate gland. The procedure should take approximately 15-30 minutes.  -Will It Hurt? You may have discomfort and soreness at the biopsy site. Pain and discomfort after the procedure can be managed with medications.  -Postoperative Care When you return home after the procedure, do the following to help ensure a smooth recovery: Stay hydrated. Drink plenty of fluids for the next few days. Avoid difficult physical activity the day and evening of the procedure. Keep in mind that you may see blood in your urine, stool, or semen for several days. Resume any medications that were stopped when you are advised to do so.  After the sample is taken, it will be sent to a pathologist for examination under a microscope. This doctor will analyze the sample for cancer. You will be scheduled for an appointment to discuss results. If cancer is present, your doctor will work with you to develop a treatment plan.   -Call Your Doctor or Seek Immediate Medical Attention It is important to monitor your recovery. Alert your doctor to any problems. If any of the following occur, call your doctor or go to the emergency room: Fever 100.5 or greater within 1 week post procedure go directly to ER Call the office for: Blood in the urine more than 1 week or in semen for more than 6 weeks post-biopsy Pain that you cannot control with the medications you have been given Pain, burning, urgency, or frequency of urination Cough, shortness of breath, or chest pain- if severe go to ER Heavy rectal bleeding or bleeding that lasts more than 1 week after the biopsy If you   have any questions or concerns please contact our office at [facility]  [facility] [address], [place], [place] [address] [phone]  AMBULATORY SURGERY  DISCHARGE  INSTRUCTIONS   1) The drugs that you were given will stay in your system until tomorrow so for the next 24 hours you should not:  A) Drive an automobile B) Make any legal decisions C) Drink any alcoholic beverage   2) You may resume regular meals tomorrow.  Today it is better to start with liquids and gradually work up to solid foods.  You may eat anything you prefer, but it is better to start with liquids, then soup and crackers, and gradually work up to solid foods.   3) Please notify your doctor immediately if you have any unusual bleeding, trouble breathing, redness and pain at the surgery site, drainage, fever, or pain not relieved by medication.    4) Additional Instructions:        Please contact your physician with any problems or Same Day Surgery at [phone], Monday through Friday 6 am to 4 pm, or [place] at [facility] number at [phone].

## 2016-12-30 NOTE — OR Nursing (Signed)
Dr Erlene Quan states pt may restart plavix today.

## 2016-12-30 NOTE — Anesthesia Procedure Notes (Signed)
Procedure Name: LMA Insertion Date/Time: 12/30/2016 7:56 AM Performed by: Doreen Salvage Pre-anesthesia Checklist: Patient identified, Patient being monitored, Timeout performed, Emergency Drugs available and Suction available Patient Re-evaluated:Patient Re-evaluated prior to inductionOxygen Delivery Method: Circle system utilized Preoxygenation: Pre-oxygenation with 100% oxygen Intubation Type: IV induction Ventilation: Mask ventilation without difficulty LMA: LMA inserted LMA Size: 5.0 Tube type: Oral Number of attempts: 1 Placement Confirmation: positive ETCO2 and breath sounds checked- equal and bilateral Tube secured with: Tape Dental Injury: Teeth and Oropharynx as per pre-operative assessment

## 2016-12-30 NOTE — Op Note (Signed)
Preoperative diagnosis: Adenocarcinoma of the prostate   Postoperative diagnosis: Same   Procedure: Prostate gold seed implantation (fiducial)   Surgeon: Hollice Espy M.D. , Lavena Stanford, M.D.   Anesthesia: General  Drains: none  Complications: none  Indications: 71 year old male with prostate cancer scheduled to undergo IMRT.  Following prostate biopsy, he had severe rectal bleeding therefore was counseled to present to the operating room today for transperineal placement of brachytherapy seeds.  Procedure: Patient was brought to operating suite and placement table in the supine position. At this time, a universal timeout protocol was performed, all team members were identified, Venodyne boots are placed, and he was administered IV antibiotics in the preoperative period. He was placed in lithotomy position and prepped and draped in usual manner. Radiation oncology department placed a transrectal ultrasound probe anchoring stand. Foley catheter was inserted without difficulty.  All needle passage was done with real-time transrectal ultrasound guidance in both the transverse and sagittal plains in order to achieve the desired preplanned position. A total of 3 needles were placed and 3 seeds were implanted at the bilateral bases of the prostate as well as at the apex.      The patient was then repositioned in the supine position, reversed from anesthesia, and taken to the PACU in stable condition.  Hollice Espy, MD

## 2016-12-30 NOTE — Transfer of Care (Signed)
Immediate Anesthesia Transfer of Care Note  Patient: [name]  Procedure(s) Performed: Procedure(s): GOLD SEED IMPLANT  x3 (N/A)  Patient Location: PACU  Anesthesia Type:General  Level of Consciousness: sedated  Airway & Oxygen Therapy: Patient Spontanous Breathing and Patient connected to face mask oxygen  Post-op Assessment: Report given to RN and Post -op Vital signs reviewed and stable  Post vital signs: Reviewed and stable  Last Vitals:  Vitals:   12/30/16 0615 12/30/16 0830  BP: (!) 143/65 (!) 100/56  Pulse: (!) 59 (!) 52  Resp: 20 13  Temp: 36.9 C 00.3 C    Complications: No apparent anesthesia complications

## 2016-12-30 NOTE — Anesthesia Postprocedure Evaluation (Signed)
Anesthesia Post Note  Patient: Bronte Kropf  Procedure(s) Performed: Procedure(s) (LRB): GOLD SEED IMPLANT  x3 (N/A)  Patient location during evaluation: PACU Anesthesia Type: General Level of consciousness: awake and alert Pain management: pain level controlled Vital Signs Assessment: post-procedure vital signs reviewed and stable Respiratory status: spontaneous breathing, nonlabored ventilation, respiratory function stable and patient connected to nasal cannula oxygen Cardiovascular status: blood pressure returned to baseline and stable Postop Assessment: no signs of nausea or vomiting Anesthetic complications: no     Last Vitals:  Vitals:   12/30/16 0919 12/30/16 0942  BP: (!) 126/52 (!) 116/52  Pulse: (!) 55 (!) 55  Resp: 16   Temp: 36.3 C     Last Pain:  Vitals:   12/30/16 0919  TempSrc:   PainSc: 1                  Martha Clan

## 2016-12-30 NOTE — Anesthesia Post-op Follow-up Note (Cosign Needed)
Anesthesia QCDR form completed.        

## 2016-12-30 NOTE — H&P (View-Only) (Signed)
12/11/2016 9:25 AM   [name] [birth year] [id number]  Referring provider: [name], MD [phone] [facility] [place], [place] [address]  Chief Complaint  Patient presents with  . Prostate Cancer    New Patient    HPI: 71 year old male referred by Dr. Noreene Filbert for placement of fiducial seed markers in anticipation of external beam radiation. [name] was diagnosed with Gleason 3+4 prostate cancer by Dr. Noah Delaine with an elevated PSA of 6.5 and hardening of the right side of the prostate on digital rectal exam. Prostate biopsy was performed on 10/02/2016, TRUS volume 33 cc. He had 9 of 12 cores positive with maximal Gleason score of 3+4.  His prostate biopsy was complicated by severe rectal bleeding requiring emergent surgical intervention by Dr. Excell Seltzer at which time bleeding internal hemorrhoids were oversewn in the operating room. Report, he lost over liter of blood.  He does have a personal history of heart attack and stroke. He is currently on aspirin and Plavix. He received neurological and cardiac clearance previously to stop his anti-platelet therapy prior to his prostate biopsy.   PMH: Past Medical History:  Diagnosis Date  . CHF (congestive heart failure) ([place])   . COPD (chronic obstructive pulmonary disease) ([place])   . Coronary artery disease   . Diabetes mellitus without complication ([place])   . GERD (gastroesophageal reflux disease)   . Hypertension   . Pneumonia   . Prostate cancer ([place])   . Stroke [facility])     Surgical History: Past Surgical History:  Procedure Laterality Date  . CORONARY ARTERY BYPASS GRAFT  2006  . HEMORRHOID SURGERY N/A 10/02/2016   Procedure: PROCTOSCOPY AND CONTROL OF RECTAL BLEEDING;  Surgeon: Excell Seltzer, MD;  Location: [facility] ORS;  Service: General;  Laterality: N/A;  . PROSTATE BIOPSY      Home Medications:  Allergies as of 12/11/2016   No Known Allergies     Medication  List       Accurate as of 12/11/16 11:59 PM. Always use your most recent med list.          acetaminophen 325 MG tablet Commonly known as:  TYLENOL Take 2 tablets (650 mg total) by mouth every 6 (six) hours as needed for mild pain (or Fever >/= 101).   albuterol 108 (90 Base) MCG/ACT inhaler Commonly known as:  PROVENTIL HFA;VENTOLIN HFA Inhale 2 puffs into the lungs every 6 (six) hours as needed for wheezing or shortness of breath.   clopidogrel 75 MG tablet Commonly known as:  PLAVIX   Fluticasone-Salmeterol 250-50 MCG/DOSE Aepb Commonly known as:  ADVAIR DISKUS Inhale 1 puff into the lungs 2 (two) times daily.   furosemide 40 MG tablet Commonly known as:  LASIX Take 40 mg by mouth 2 (two) times daily.   gabapentin 100 MG capsule Commonly known as:  NEURONTIN Take 100 mg by mouth as needed.   hydrocortisone cream 1 % Apply topically 3 (three) times daily.   ipratropium-albuterol 0.5-2.5 (3) MG/3ML Soln Commonly known as:  DUONEB Inhale 3 mLs into the lungs 4 (four) times daily as needed for shortness of breath or wheezing.   isosorbide mononitrate 30 MG 24 hr tablet Commonly known as:  IMDUR Take 30 mg by mouth daily.   LANTUS SOLOSTAR 100 UNIT/ML Solostar Pen Generic drug:  Insulin Glargine   lisinopril 40 MG tablet Commonly known as:  PRINIVIL,ZESTRIL Take 40 mg by mouth daily.   metFORMIN 1000 MG tablet  Commonly known as:  GLUCOPHAGE Take 1,000 mg by mouth 2 (two) times daily with a meal.   metoprolol 50 MG tablet Commonly known as:  LOPRESSOR Take 50 mg by mouth 2 (two) times daily.   multivitamin with minerals Tabs tablet Take 1 tablet by mouth daily.   multivitamin-lutein Caps capsule Take 1 capsule by mouth daily.   omeprazole 20 MG capsule Commonly known as:  PRILOSEC Take 20 mg by mouth daily as needed (heartburn).   potassium chloride 10 MEQ tablet Commonly known as:  K-DUR Take 10 mEq by mouth daily.   sacubitril-valsartan 24-26  MG Commonly known as:  ENTRESTO Take 1 tablet by mouth 2 (two) times daily.   simvastatin 20 MG tablet Commonly known as:  ZOCOR Take 20 mg by mouth at bedtime.   tamsulosin 0.4 MG Caps capsule Commonly known as:  FLOMAX Take on tab daily       Allergies: No Known Allergies  Family History: Family History  Problem Relation Age of Onset  . Lung cancer Mother   . Heart attack Father   . Hypertension Other   . Cancer Maternal Grandfather     prostate  . Cancer Paternal Grandmother     breast    Social History:  reports that he has been smoking Cigarettes.  He has a 27.00 pack-year smoking history. He has never used smokeless tobacco. He reports that he does not drink alcohol or use drugs.  ROS: UROLOGY Frequent Urination?: Yes Hard to postpone urination?: Yes Burning/pain with urination?: No Get up at night to urinate?: Yes Leakage of urine?: Yes Urine stream starts and stops?: No Trouble starting stream?: No Do you have to strain to urinate?: No Blood in urine?: No Urinary tract infection?: No Sexually transmitted disease?: No Injury to kidneys or bladder?: No Painful intercourse?: No Weak stream?: No Erection problems?: Yes Penile pain?: No  Gastrointestinal Nausea?: No Vomiting?: No Indigestion/heartburn?: Yes Diarrhea?: No Constipation?: No  Constitutional Fever: No Night sweats?: No Weight loss?: No Fatigue?: No  Skin Skin rash/lesions?: No Itching?: No  Eyes Blurred vision?: No Double vision?: No  Ears/Nose/Throat Sore throat?: No Sinus problems?: No  Hematologic/Lymphatic Swollen glands?: No Easy bruising?: Yes  Cardiovascular Leg swelling?: No Chest pain?: No  Respiratory Cough?: No Shortness of breath?: Yes  Endocrine Excessive thirst?: No  Musculoskeletal Back pain?: No Joint pain?: No  Neurological Headaches?: No Dizziness?: No  Psychologic Depression?: No Anxiety?: No  Physical Exam: BP (!) 205/75   Pulse  75   Ht 5\' 6"  (1.676 m)   Wt 165 lb (74.8 kg)   BMI 26.63 kg/m   Constitutional:  Alert and oriented, No acute distress. Accompanied by family today. HEENT: [place] AT, moist mucus membranes.  Trachea midline, no masses. Cardiovascular: No clubbing, cyanosis, or edema. Respiratory: Normal respiratory effort, no increased work of breathing. GI: Abdomen is soft, nontender, nondistended, no abdominal masses GU: No CVA tenderness.  Skin: No rashes, bruises or suspicious lesions. Neurologic: Grossly intact, no focal deficits, moving all 4 extremities. Psychiatric: Normal mood and affect.  Laboratory Data: Lab Results  Component Value Date   WBC 9.3 10/03/2016   HGB 13.5 10/03/2016   HCT 40.3 10/03/2016   MCV 90.8 10/03/2016   PLT 228 10/03/2016    Lab Results  Component Value Date   CREATININE 1.70 (H) 10/02/2016     Lab Results  Component Value Date   HGBA1C 9.3 (H) 12/04/2015    Urinalysis    Component Value Date/Time  COLORURINE YELLOW (A) 01/15/2016 2115   APPEARANCEUR CLEAR (A) 01/15/2016 2115   APPEARANCEUR Clear 04/28/2014 2346   LABSPEC 1.010 01/15/2016 2115   LABSPEC 1.009 04/28/2014 2346   PHURINE 5.0 01/15/2016 2115   GLUCOSEU NEGATIVE 01/15/2016 2115   GLUCOSEU Negative 04/28/2014 2346   HGBUR 1+ (A) 01/15/2016 2115   BILIRUBINUR NEGATIVE 01/15/2016 2115   BILIRUBINUR Negative 04/28/2014 2346   [place] 01/15/2016 2115   PROTEINUR 100 (A) 01/15/2016 2115   NITRITE NEGATIVE 01/15/2016 2115   LEUKOCYTESUR TRACE (A) 01/15/2016 2115   LEUKOCYTESUR 1+ 04/28/2014 2346    Pertinent Imaging: n/a  Assessment & Plan:    1. Prostate cancer ([place]) Intermediate risk prostate cancer scheduled for external beam radiation Given his history of severe life-threatening rectal bleeding following prostate biopsy, I'm very hesitant to place fiducial seed markers transrectally We discussed the option of transperineal fiducial seed markers to avoid internal  hemorrhoids and reduce risk of bleeding This would be best done in the operating room with sedation/anesthesia  Case was discussed with Dr. Noreene Filbert who as aggreed with this plan and willing to assist Risks and benefits were reviewed in detail including risk of bleeding, infection, pain, damage to any structures, amongst others. He has family are agreeable to this plan and willing to proceed.  2. Bright red rectal bleeding As above, high risk for bleeding via transrectal approach  3. Chronic systolic heart failure (HCC) We'll need to confirm cardiac clearance in order to hold aspirin and Plavix Given his episode of severe profound bleeding in the past, recommend holding these agents for 7 days prior to procedure  Schedule transperineal fiducial culture markers in the operating room   Hollice Espy, MD  [facility] [address] [place], [place] [address] [phone]

## 2016-12-30 NOTE — Anesthesia Preprocedure Evaluation (Signed)
Anesthesia Evaluation  Patient identified by MRN, date of birth, ID band Patient awake    Reviewed: Allergy & Precautions, H&P , NPO status , Patient's Chart, lab work & pertinent test results, reviewed documented beta blocker date and time   History of Anesthesia Complications Negative for: history of anesthetic complications  Airway Mallampati: I  TM Distance: >3 FB Neck ROM: full    Dental  (+) Missing, Poor Dentition   Pulmonary shortness of breath and with exertion, neg sleep apnea, COPD,  COPD inhaler, neg recent URI, Current Smoker,           Cardiovascular Exercise Tolerance: Good hypertension, (-) angina+ CAD, + Past MI, + CABG, + Peripheral Vascular Disease and +CHF  (-) Cardiac Stents negative cardio ROS  (-) dysrhythmias (-) Valvular Problems/Murmurs     Neuro/Psych neg Seizures CVA, Residual Symptoms negative psych ROS   GI/Hepatic Neg liver ROS, GERD  Medicated,  Endo/Other  diabetes  Renal/GU negative Renal ROS  negative genitourinary   Musculoskeletal   Abdominal   Peds  Hematology negative hematology ROS (+)   Anesthesia Other Findings Past Medical History: No date: CHF (congestive heart failure) (HCC) No date: COPD (chronic obstructive pulmonary disease) (* No date: Coronary artery disease No date: Diabetes mellitus without complication (HCC) No date: GERD (gastroesophageal reflux disease) No date: HLD (hyperlipidemia) No date: Hypertension No date: Peripheral vascular disease (HCC) No date: Pneumonia No date: Prostate cancer ([place]) No date: Stroke ([place])   Reproductive/Obstetrics negative OB ROS                             Anesthesia Physical Anesthesia Plan  ASA: III  Anesthesia Plan: General   Post-op Pain Management:    Induction:   Airway Management Planned:   Additional Equipment:   Intra-op Plan:   Post-operative Plan:   Informed Consent: I  have reviewed the patients History and Physical, chart, labs and discussed the procedure including the risks, benefits and alternatives for the proposed anesthesia with the patient or authorized representative who has indicated his/her understanding and acceptance.   Dental Advisory Given  Plan Discussed with: Anesthesiologist, CRNA and Surgeon  Anesthesia Plan Comments:         Anesthesia Quick Evaluation

## 2016-12-31 ENCOUNTER — Encounter: Payer: Self-pay | Admitting: Urology

## 2017-01-05 ENCOUNTER — Ambulatory Visit
Admission: RE | Admit: 2017-01-05 | Discharge: 2017-01-05 | Disposition: A | Discharge status: Home / Self Care | Payer: PPO | Source: Ambulatory Visit | Attending: Radiation Oncology | Admitting: Radiation Oncology

## 2017-01-05 DIAGNOSIS — Z510 Encounter for antineoplastic radiation therapy: Secondary | ICD-10-CM | POA: Diagnosis not present

## 2017-01-05 DIAGNOSIS — C61 Malignant neoplasm of prostate: Secondary | ICD-10-CM | POA: Diagnosis not present

## 2017-01-09 ENCOUNTER — Other Ambulatory Visit: Payer: Self-pay | Admitting: *Deleted

## 2017-01-09 DIAGNOSIS — C61 Malignant neoplasm of prostate: Secondary | ICD-10-CM

## 2017-01-12 DIAGNOSIS — Z510 Encounter for antineoplastic radiation therapy: Secondary | ICD-10-CM | POA: Diagnosis not present

## 2017-01-12 DIAGNOSIS — Z794 Long term (current) use of insulin: Secondary | ICD-10-CM | POA: Diagnosis not present

## 2017-01-12 DIAGNOSIS — C61 Malignant neoplasm of prostate: Secondary | ICD-10-CM | POA: Diagnosis not present

## 2017-01-12 DIAGNOSIS — Z801 Family history of malignant neoplasm of trachea, bronchus and lung: Secondary | ICD-10-CM | POA: Diagnosis not present

## 2017-01-12 DIAGNOSIS — E119 Type 2 diabetes mellitus without complications: Secondary | ICD-10-CM | POA: Diagnosis not present

## 2017-01-12 DIAGNOSIS — Z809 Family history of malignant neoplasm, unspecified: Secondary | ICD-10-CM | POA: Diagnosis not present

## 2017-01-12 DIAGNOSIS — K219 Gastro-esophageal reflux disease without esophagitis: Secondary | ICD-10-CM | POA: Diagnosis not present

## 2017-01-12 DIAGNOSIS — I209 Angina pectoris, unspecified: Secondary | ICD-10-CM | POA: Diagnosis not present

## 2017-01-12 DIAGNOSIS — Z8673 Personal history of transient ischemic attack (TIA), and cerebral infarction without residual deficits: Secondary | ICD-10-CM | POA: Diagnosis not present

## 2017-01-12 DIAGNOSIS — F17210 Nicotine dependence, cigarettes, uncomplicated: Secondary | ICD-10-CM | POA: Diagnosis not present

## 2017-01-12 DIAGNOSIS — Z79899 Other long term (current) drug therapy: Secondary | ICD-10-CM | POA: Diagnosis not present

## 2017-01-12 DIAGNOSIS — J449 Chronic obstructive pulmonary disease, unspecified: Secondary | ICD-10-CM | POA: Diagnosis not present

## 2017-01-12 DIAGNOSIS — I10 Essential (primary) hypertension: Secondary | ICD-10-CM | POA: Diagnosis not present

## 2017-01-12 DIAGNOSIS — I2510 Atherosclerotic heart disease of native coronary artery without angina pectoris: Secondary | ICD-10-CM | POA: Diagnosis not present

## 2017-01-12 DIAGNOSIS — Z8701 Personal history of pneumonia (recurrent): Secondary | ICD-10-CM | POA: Diagnosis not present

## 2017-01-14 ENCOUNTER — Ambulatory Visit
Admission: RE | Admit: 2017-01-14 | Discharge: 2017-01-14 | Disposition: A | Discharge status: Home / Self Care | Payer: PPO | Source: Ambulatory Visit | Attending: Radiation Oncology | Admitting: Radiation Oncology

## 2017-01-14 DIAGNOSIS — I10 Essential (primary) hypertension: Secondary | ICD-10-CM | POA: Insufficient documentation

## 2017-01-14 DIAGNOSIS — M899 Disorder of bone, unspecified: Secondary | ICD-10-CM | POA: Insufficient documentation

## 2017-01-14 DIAGNOSIS — I2510 Atherosclerotic heart disease of native coronary artery without angina pectoris: Secondary | ICD-10-CM | POA: Insufficient documentation

## 2017-01-14 DIAGNOSIS — Z510 Encounter for antineoplastic radiation therapy: Secondary | ICD-10-CM | POA: Insufficient documentation

## 2017-01-14 DIAGNOSIS — Z801 Family history of malignant neoplasm of trachea, bronchus and lung: Secondary | ICD-10-CM | POA: Insufficient documentation

## 2017-01-14 DIAGNOSIS — R32 Unspecified urinary incontinence: Secondary | ICD-10-CM | POA: Insufficient documentation

## 2017-01-14 DIAGNOSIS — C61 Malignant neoplasm of prostate: Secondary | ICD-10-CM | POA: Insufficient documentation

## 2017-01-14 DIAGNOSIS — E119 Type 2 diabetes mellitus without complications: Secondary | ICD-10-CM | POA: Insufficient documentation

## 2017-01-14 DIAGNOSIS — K219 Gastro-esophageal reflux disease without esophagitis: Secondary | ICD-10-CM | POA: Insufficient documentation

## 2017-01-14 DIAGNOSIS — Z8701 Personal history of pneumonia (recurrent): Secondary | ICD-10-CM | POA: Insufficient documentation

## 2017-01-14 DIAGNOSIS — Z809 Family history of malignant neoplasm, unspecified: Secondary | ICD-10-CM | POA: Insufficient documentation

## 2017-01-14 DIAGNOSIS — Z8673 Personal history of transient ischemic attack (TIA), and cerebral infarction without residual deficits: Secondary | ICD-10-CM | POA: Insufficient documentation

## 2017-01-14 DIAGNOSIS — F17210 Nicotine dependence, cigarettes, uncomplicated: Secondary | ICD-10-CM | POA: Insufficient documentation

## 2017-01-14 DIAGNOSIS — Z79899 Other long term (current) drug therapy: Secondary | ICD-10-CM | POA: Insufficient documentation

## 2017-01-14 DIAGNOSIS — I509 Heart failure, unspecified: Secondary | ICD-10-CM | POA: Insufficient documentation

## 2017-01-14 DIAGNOSIS — J449 Chronic obstructive pulmonary disease, unspecified: Secondary | ICD-10-CM | POA: Insufficient documentation

## 2017-01-15 ENCOUNTER — Ambulatory Visit
Admission: RE | Admit: 2017-01-15 | Discharge: 2017-01-15 | Disposition: A | Discharge status: Home / Self Care | Payer: PPO | Source: Ambulatory Visit | Attending: Radiation Oncology | Admitting: Radiation Oncology

## 2017-01-15 DIAGNOSIS — F17210 Nicotine dependence, cigarettes, uncomplicated: Secondary | ICD-10-CM | POA: Diagnosis not present

## 2017-01-15 DIAGNOSIS — I2510 Atherosclerotic heart disease of native coronary artery without angina pectoris: Secondary | ICD-10-CM | POA: Diagnosis not present

## 2017-01-15 DIAGNOSIS — Z801 Family history of malignant neoplasm of trachea, bronchus and lung: Secondary | ICD-10-CM | POA: Diagnosis not present

## 2017-01-15 DIAGNOSIS — I10 Essential (primary) hypertension: Secondary | ICD-10-CM | POA: Diagnosis not present

## 2017-01-15 DIAGNOSIS — Z8701 Personal history of pneumonia (recurrent): Secondary | ICD-10-CM | POA: Diagnosis not present

## 2017-01-15 DIAGNOSIS — Z8673 Personal history of transient ischemic attack (TIA), and cerebral infarction without residual deficits: Secondary | ICD-10-CM | POA: Diagnosis not present

## 2017-01-15 DIAGNOSIS — Z79899 Other long term (current) drug therapy: Secondary | ICD-10-CM | POA: Diagnosis not present

## 2017-01-15 DIAGNOSIS — J449 Chronic obstructive pulmonary disease, unspecified: Secondary | ICD-10-CM | POA: Diagnosis not present

## 2017-01-15 DIAGNOSIS — K219 Gastro-esophageal reflux disease without esophagitis: Secondary | ICD-10-CM | POA: Diagnosis not present

## 2017-01-15 DIAGNOSIS — R32 Unspecified urinary incontinence: Secondary | ICD-10-CM | POA: Diagnosis not present

## 2017-01-15 DIAGNOSIS — C61 Malignant neoplasm of prostate: Secondary | ICD-10-CM | POA: Diagnosis not present

## 2017-01-15 DIAGNOSIS — E119 Type 2 diabetes mellitus without complications: Secondary | ICD-10-CM | POA: Diagnosis not present

## 2017-01-15 DIAGNOSIS — I509 Heart failure, unspecified: Secondary | ICD-10-CM | POA: Diagnosis not present

## 2017-01-15 DIAGNOSIS — Z510 Encounter for antineoplastic radiation therapy: Secondary | ICD-10-CM | POA: Diagnosis not present

## 2017-01-15 DIAGNOSIS — M899 Disorder of bone, unspecified: Secondary | ICD-10-CM | POA: Diagnosis not present

## 2017-01-15 DIAGNOSIS — Z809 Family history of malignant neoplasm, unspecified: Secondary | ICD-10-CM | POA: Diagnosis not present

## 2017-01-16 ENCOUNTER — Ambulatory Visit
Admission: RE | Admit: 2017-01-16 | Discharge: 2017-01-16 | Disposition: A | Discharge status: Home / Self Care | Payer: PPO | Source: Ambulatory Visit | Attending: Radiation Oncology | Admitting: Radiation Oncology

## 2017-01-16 DIAGNOSIS — C61 Malignant neoplasm of prostate: Secondary | ICD-10-CM | POA: Diagnosis not present

## 2017-01-16 DIAGNOSIS — Z510 Encounter for antineoplastic radiation therapy: Secondary | ICD-10-CM | POA: Diagnosis not present

## 2017-01-19 ENCOUNTER — Ambulatory Visit
Admission: RE | Admit: 2017-01-19 | Discharge: 2017-01-19 | Disposition: A | Discharge status: Home / Self Care | Payer: PPO | Source: Ambulatory Visit | Attending: Radiation Oncology | Admitting: Radiation Oncology

## 2017-01-19 DIAGNOSIS — Z510 Encounter for antineoplastic radiation therapy: Secondary | ICD-10-CM | POA: Diagnosis not present

## 2017-01-19 DIAGNOSIS — C61 Malignant neoplasm of prostate: Secondary | ICD-10-CM | POA: Diagnosis not present

## 2017-01-20 ENCOUNTER — Ambulatory Visit
Admission: RE | Admit: 2017-01-20 | Discharge: 2017-01-20 | Disposition: A | Discharge status: Home / Self Care | Payer: PPO | Source: Ambulatory Visit | Attending: Radiation Oncology | Admitting: Radiation Oncology

## 2017-01-20 DIAGNOSIS — C61 Malignant neoplasm of prostate: Secondary | ICD-10-CM | POA: Diagnosis not present

## 2017-01-20 DIAGNOSIS — Z510 Encounter for antineoplastic radiation therapy: Secondary | ICD-10-CM | POA: Diagnosis not present

## 2017-01-21 ENCOUNTER — Ambulatory Visit
Admission: RE | Admit: 2017-01-21 | Discharge: 2017-01-21 | Disposition: A | Discharge status: Home / Self Care | Payer: PPO | Source: Ambulatory Visit | Attending: Radiation Oncology | Admitting: Radiation Oncology

## 2017-01-21 DIAGNOSIS — Z510 Encounter for antineoplastic radiation therapy: Secondary | ICD-10-CM | POA: Diagnosis not present

## 2017-01-21 DIAGNOSIS — C61 Malignant neoplasm of prostate: Secondary | ICD-10-CM | POA: Diagnosis not present

## 2017-01-22 ENCOUNTER — Ambulatory Visit
Admission: RE | Admit: 2017-01-22 | Discharge: 2017-01-22 | Disposition: A | Discharge status: Home / Self Care | Payer: PPO | Source: Ambulatory Visit | Attending: Radiation Oncology | Admitting: Radiation Oncology

## 2017-01-22 DIAGNOSIS — Z510 Encounter for antineoplastic radiation therapy: Secondary | ICD-10-CM | POA: Diagnosis not present

## 2017-01-22 DIAGNOSIS — C61 Malignant neoplasm of prostate: Secondary | ICD-10-CM | POA: Diagnosis not present

## 2017-01-23 ENCOUNTER — Ambulatory Visit
Admission: RE | Admit: 2017-01-23 | Discharge: 2017-01-23 | Disposition: A | Discharge status: Home / Self Care | Payer: PPO | Source: Ambulatory Visit | Attending: Radiation Oncology | Admitting: Radiation Oncology

## 2017-01-23 DIAGNOSIS — C61 Malignant neoplasm of prostate: Secondary | ICD-10-CM | POA: Diagnosis not present

## 2017-01-23 DIAGNOSIS — Z510 Encounter for antineoplastic radiation therapy: Secondary | ICD-10-CM | POA: Diagnosis not present

## 2017-01-26 ENCOUNTER — Ambulatory Visit: Payer: PPO

## 2017-01-27 ENCOUNTER — Ambulatory Visit
Admission: RE | Admit: 2017-01-27 | Discharge: 2017-01-27 | Disposition: A | Discharge status: Home / Self Care | Payer: PPO | Source: Ambulatory Visit | Attending: Radiation Oncology | Admitting: Radiation Oncology

## 2017-01-27 DIAGNOSIS — Z510 Encounter for antineoplastic radiation therapy: Secondary | ICD-10-CM | POA: Diagnosis not present

## 2017-01-27 DIAGNOSIS — C61 Malignant neoplasm of prostate: Secondary | ICD-10-CM | POA: Diagnosis not present

## 2017-01-28 ENCOUNTER — Ambulatory Visit
Admission: RE | Admit: 2017-01-28 | Discharge: 2017-01-28 | Disposition: A | Discharge status: Home / Self Care | Payer: PPO | Source: Ambulatory Visit | Attending: Radiation Oncology | Admitting: Radiation Oncology

## 2017-01-28 DIAGNOSIS — C61 Malignant neoplasm of prostate: Secondary | ICD-10-CM | POA: Diagnosis not present

## 2017-01-28 DIAGNOSIS — Z510 Encounter for antineoplastic radiation therapy: Secondary | ICD-10-CM | POA: Diagnosis not present

## 2017-01-29 ENCOUNTER — Ambulatory Visit
Admission: RE | Admit: 2017-01-29 | Discharge: 2017-01-29 | Disposition: A | Discharge status: Home / Self Care | Payer: PPO | Source: Ambulatory Visit | Attending: Radiation Oncology | Admitting: Radiation Oncology

## 2017-01-29 DIAGNOSIS — C61 Malignant neoplasm of prostate: Secondary | ICD-10-CM | POA: Diagnosis not present

## 2017-01-29 DIAGNOSIS — Z510 Encounter for antineoplastic radiation therapy: Secondary | ICD-10-CM | POA: Diagnosis not present

## 2017-01-30 ENCOUNTER — Ambulatory Visit
Admission: RE | Admit: 2017-01-30 | Discharge: 2017-01-30 | Disposition: A | Discharge status: Home / Self Care | Payer: PPO | Source: Ambulatory Visit | Attending: Radiation Oncology | Admitting: Radiation Oncology

## 2017-01-30 ENCOUNTER — Inpatient Hospital Stay: Payer: PPO | Attending: Radiation Oncology

## 2017-01-30 DIAGNOSIS — C61 Malignant neoplasm of prostate: Secondary | ICD-10-CM | POA: Diagnosis not present

## 2017-01-30 DIAGNOSIS — Z510 Encounter for antineoplastic radiation therapy: Secondary | ICD-10-CM | POA: Diagnosis not present

## 2017-01-30 LAB — CBC
HEMATOCRIT: 47.4 % (ref 40.0–52.0)
HEMOGLOBIN: 16.1 g/dL (ref 13.0–18.0)
MCH: 29.8 pg (ref 26.0–34.0)
MCHC: 33.9 g/dL (ref 32.0–36.0)
MCV: 87.8 fL (ref 80.0–100.0)
Platelets: 219 10*3/uL (ref 150–440)
RBC: 5.4 MIL/uL (ref 4.40–5.90)
RDW: 15.8 % — ABNORMAL HIGH (ref 11.5–14.5)
WBC: 9.2 10*3/uL (ref 3.8–10.6)

## 2017-02-02 ENCOUNTER — Ambulatory Visit
Admission: RE | Admit: 2017-02-02 | Discharge: 2017-02-02 | Disposition: A | Discharge status: Home / Self Care | Payer: PPO | Source: Ambulatory Visit | Attending: Radiation Oncology | Admitting: Radiation Oncology

## 2017-02-02 DIAGNOSIS — Z510 Encounter for antineoplastic radiation therapy: Secondary | ICD-10-CM | POA: Diagnosis not present

## 2017-02-02 DIAGNOSIS — C61 Malignant neoplasm of prostate: Secondary | ICD-10-CM | POA: Diagnosis not present

## 2017-02-03 ENCOUNTER — Ambulatory Visit
Admission: RE | Admit: 2017-02-03 | Discharge: 2017-02-03 | Disposition: A | Discharge status: Home / Self Care | Payer: PPO | Source: Ambulatory Visit | Attending: Radiation Oncology | Admitting: Radiation Oncology

## 2017-02-03 DIAGNOSIS — C61 Malignant neoplasm of prostate: Secondary | ICD-10-CM | POA: Diagnosis not present

## 2017-02-03 DIAGNOSIS — Z510 Encounter for antineoplastic radiation therapy: Secondary | ICD-10-CM | POA: Diagnosis not present

## 2017-02-04 ENCOUNTER — Ambulatory Visit
Admission: RE | Admit: 2017-02-04 | Discharge: 2017-02-04 | Disposition: A | Discharge status: Home / Self Care | Payer: PPO | Source: Ambulatory Visit | Attending: Radiation Oncology | Admitting: Radiation Oncology

## 2017-02-04 DIAGNOSIS — C61 Malignant neoplasm of prostate: Secondary | ICD-10-CM | POA: Diagnosis not present

## 2017-02-04 DIAGNOSIS — Z510 Encounter for antineoplastic radiation therapy: Secondary | ICD-10-CM | POA: Diagnosis not present

## 2017-02-05 ENCOUNTER — Ambulatory Visit
Admission: RE | Admit: 2017-02-05 | Discharge: 2017-02-05 | Disposition: A | Discharge status: Home / Self Care | Payer: PPO | Source: Ambulatory Visit | Attending: Radiation Oncology | Admitting: Radiation Oncology

## 2017-02-05 DIAGNOSIS — Z510 Encounter for antineoplastic radiation therapy: Secondary | ICD-10-CM | POA: Diagnosis not present

## 2017-02-05 DIAGNOSIS — C61 Malignant neoplasm of prostate: Secondary | ICD-10-CM | POA: Diagnosis not present

## 2017-02-06 ENCOUNTER — Ambulatory Visit
Admission: RE | Admit: 2017-02-06 | Discharge: 2017-02-06 | Disposition: A | Discharge status: Home / Self Care | Payer: PPO | Source: Ambulatory Visit | Attending: Radiation Oncology | Admitting: Radiation Oncology

## 2017-02-06 DIAGNOSIS — Z510 Encounter for antineoplastic radiation therapy: Secondary | ICD-10-CM | POA: Diagnosis not present

## 2017-02-06 DIAGNOSIS — C61 Malignant neoplasm of prostate: Secondary | ICD-10-CM | POA: Diagnosis not present

## 2017-02-09 ENCOUNTER — Ambulatory Visit
Admission: RE | Admit: 2017-02-09 | Discharge: 2017-02-09 | Disposition: A | Discharge status: Home / Self Care | Payer: PPO | Source: Ambulatory Visit | Attending: Radiation Oncology | Admitting: Radiation Oncology

## 2017-02-09 DIAGNOSIS — Z510 Encounter for antineoplastic radiation therapy: Secondary | ICD-10-CM | POA: Diagnosis not present

## 2017-02-09 DIAGNOSIS — C61 Malignant neoplasm of prostate: Secondary | ICD-10-CM | POA: Diagnosis not present

## 2017-02-10 ENCOUNTER — Ambulatory Visit
Admission: RE | Admit: 2017-02-10 | Discharge: 2017-02-10 | Disposition: A | Discharge status: Home / Self Care | Payer: PPO | Source: Ambulatory Visit | Attending: Radiation Oncology | Admitting: Radiation Oncology

## 2017-02-10 DIAGNOSIS — Z510 Encounter for antineoplastic radiation therapy: Secondary | ICD-10-CM | POA: Diagnosis not present

## 2017-02-10 DIAGNOSIS — C61 Malignant neoplasm of prostate: Secondary | ICD-10-CM | POA: Diagnosis not present

## 2017-02-10 DIAGNOSIS — Z79899 Other long term (current) drug therapy: Secondary | ICD-10-CM | POA: Diagnosis not present

## 2017-02-10 DIAGNOSIS — E1122 Type 2 diabetes mellitus with diabetic chronic kidney disease: Secondary | ICD-10-CM | POA: Diagnosis not present

## 2017-02-10 DIAGNOSIS — N183 Chronic kidney disease, stage 3 (moderate): Secondary | ICD-10-CM | POA: Diagnosis not present

## 2017-02-10 DIAGNOSIS — E7800 Pure hypercholesterolemia, unspecified: Secondary | ICD-10-CM | POA: Diagnosis not present

## 2017-02-10 DIAGNOSIS — Z794 Long term (current) use of insulin: Secondary | ICD-10-CM | POA: Diagnosis not present

## 2017-02-11 ENCOUNTER — Ambulatory Visit
Admission: RE | Admit: 2017-02-11 | Discharge: 2017-02-11 | Disposition: A | Discharge status: Home / Self Care | Payer: PPO | Source: Ambulatory Visit | Attending: Radiation Oncology | Admitting: Radiation Oncology

## 2017-02-11 DIAGNOSIS — C61 Malignant neoplasm of prostate: Secondary | ICD-10-CM | POA: Diagnosis not present

## 2017-02-11 DIAGNOSIS — Z510 Encounter for antineoplastic radiation therapy: Secondary | ICD-10-CM | POA: Diagnosis not present

## 2017-02-12 ENCOUNTER — Ambulatory Visit
Admission: RE | Admit: 2017-02-12 | Discharge: 2017-02-12 | Disposition: A | Discharge status: Home / Self Care | Payer: PPO | Source: Ambulatory Visit | Attending: Radiation Oncology | Admitting: Radiation Oncology

## 2017-02-12 DIAGNOSIS — Z510 Encounter for antineoplastic radiation therapy: Secondary | ICD-10-CM | POA: Diagnosis not present

## 2017-02-12 DIAGNOSIS — C61 Malignant neoplasm of prostate: Secondary | ICD-10-CM | POA: Diagnosis not present

## 2017-02-13 ENCOUNTER — Inpatient Hospital Stay: Payer: PPO | Attending: Radiation Oncology

## 2017-02-13 ENCOUNTER — Ambulatory Visit
Admission: RE | Admit: 2017-02-13 | Discharge: 2017-02-13 | Disposition: A | Discharge status: Home / Self Care | Payer: PPO | Source: Ambulatory Visit | Attending: Radiation Oncology | Admitting: Radiation Oncology

## 2017-02-13 DIAGNOSIS — C61 Malignant neoplasm of prostate: Secondary | ICD-10-CM | POA: Insufficient documentation

## 2017-02-13 DIAGNOSIS — Z510 Encounter for antineoplastic radiation therapy: Secondary | ICD-10-CM | POA: Diagnosis not present

## 2017-02-13 LAB — CBC
HCT: 45.3 % (ref 40.0–52.0)
HEMOGLOBIN: 15.6 g/dL (ref 13.0–18.0)
MCH: 30.1 pg (ref 26.0–34.0)
MCHC: 34.4 g/dL (ref 32.0–36.0)
MCV: 87.5 fL (ref 80.0–100.0)
Platelets: 244 10*3/uL (ref 150–440)
RBC: 5.18 MIL/uL (ref 4.40–5.90)
RDW: 15.9 % — ABNORMAL HIGH (ref 11.5–14.5)
WBC: 10.7 10*3/uL — ABNORMAL HIGH (ref 3.8–10.6)

## 2017-02-16 ENCOUNTER — Ambulatory Visit
Admission: RE | Admit: 2017-02-16 | Discharge: 2017-02-16 | Disposition: A | Discharge status: Home / Self Care | Payer: PPO | Source: Ambulatory Visit | Attending: Radiation Oncology | Admitting: Radiation Oncology

## 2017-02-16 DIAGNOSIS — Z510 Encounter for antineoplastic radiation therapy: Secondary | ICD-10-CM | POA: Diagnosis not present

## 2017-02-16 DIAGNOSIS — C61 Malignant neoplasm of prostate: Secondary | ICD-10-CM | POA: Diagnosis not present

## 2017-02-17 ENCOUNTER — Ambulatory Visit
Admission: RE | Admit: 2017-02-17 | Discharge: 2017-02-17 | Disposition: A | Discharge status: Home / Self Care | Payer: PPO | Source: Ambulatory Visit | Attending: Radiation Oncology | Admitting: Radiation Oncology

## 2017-02-17 DIAGNOSIS — C61 Malignant neoplasm of prostate: Secondary | ICD-10-CM | POA: Diagnosis not present

## 2017-02-17 DIAGNOSIS — Z510 Encounter for antineoplastic radiation therapy: Secondary | ICD-10-CM | POA: Diagnosis not present

## 2017-02-18 ENCOUNTER — Ambulatory Visit
Admission: RE | Admit: 2017-02-18 | Discharge: 2017-02-18 | Disposition: A | Discharge status: Home / Self Care | Payer: PPO | Source: Ambulatory Visit | Attending: Radiation Oncology | Admitting: Radiation Oncology

## 2017-02-18 DIAGNOSIS — C61 Malignant neoplasm of prostate: Secondary | ICD-10-CM | POA: Diagnosis not present

## 2017-02-18 DIAGNOSIS — Z510 Encounter for antineoplastic radiation therapy: Secondary | ICD-10-CM | POA: Diagnosis not present

## 2017-02-19 ENCOUNTER — Ambulatory Visit
Admission: RE | Admit: 2017-02-19 | Discharge: 2017-02-19 | Disposition: A | Discharge status: Home / Self Care | Payer: PPO | Source: Ambulatory Visit | Attending: Radiation Oncology | Admitting: Radiation Oncology

## 2017-02-19 DIAGNOSIS — C61 Malignant neoplasm of prostate: Secondary | ICD-10-CM | POA: Diagnosis not present

## 2017-02-19 DIAGNOSIS — Z510 Encounter for antineoplastic radiation therapy: Secondary | ICD-10-CM | POA: Diagnosis not present

## 2017-02-20 ENCOUNTER — Ambulatory Visit
Admission: RE | Admit: 2017-02-20 | Discharge: 2017-02-20 | Disposition: A | Discharge status: Home / Self Care | Payer: PPO | Source: Ambulatory Visit | Attending: Radiation Oncology | Admitting: Radiation Oncology

## 2017-02-20 DIAGNOSIS — Z510 Encounter for antineoplastic radiation therapy: Secondary | ICD-10-CM | POA: Diagnosis not present

## 2017-02-20 DIAGNOSIS — C61 Malignant neoplasm of prostate: Secondary | ICD-10-CM | POA: Diagnosis not present

## 2017-02-23 ENCOUNTER — Ambulatory Visit: Payer: PPO

## 2017-02-23 ENCOUNTER — Ambulatory Visit
Admission: RE | Admit: 2017-02-23 | Discharge: 2017-02-23 | Disposition: A | Discharge status: Home / Self Care | Payer: PPO | Source: Ambulatory Visit | Attending: Radiation Oncology | Admitting: Radiation Oncology

## 2017-02-23 DIAGNOSIS — Z510 Encounter for antineoplastic radiation therapy: Secondary | ICD-10-CM | POA: Diagnosis not present

## 2017-02-23 DIAGNOSIS — C61 Malignant neoplasm of prostate: Secondary | ICD-10-CM | POA: Diagnosis not present

## 2017-02-24 ENCOUNTER — Ambulatory Visit
Admission: RE | Admit: 2017-02-24 | Discharge: 2017-02-24 | Disposition: A | Discharge status: Home / Self Care | Payer: PPO | Source: Ambulatory Visit | Attending: Radiation Oncology | Admitting: Radiation Oncology

## 2017-02-24 DIAGNOSIS — C61 Malignant neoplasm of prostate: Secondary | ICD-10-CM | POA: Diagnosis not present

## 2017-02-24 DIAGNOSIS — Z510 Encounter for antineoplastic radiation therapy: Secondary | ICD-10-CM | POA: Diagnosis not present

## 2017-02-25 ENCOUNTER — Ambulatory Visit
Admission: RE | Admit: 2017-02-25 | Discharge: 2017-02-25 | Disposition: A | Discharge status: Home / Self Care | Payer: PPO | Source: Ambulatory Visit | Attending: Radiation Oncology | Admitting: Radiation Oncology

## 2017-02-25 DIAGNOSIS — C61 Malignant neoplasm of prostate: Secondary | ICD-10-CM | POA: Diagnosis not present

## 2017-02-25 DIAGNOSIS — Z510 Encounter for antineoplastic radiation therapy: Secondary | ICD-10-CM | POA: Diagnosis not present

## 2017-02-26 ENCOUNTER — Ambulatory Visit
Admission: RE | Admit: 2017-02-26 | Discharge: 2017-02-26 | Disposition: A | Discharge status: Home / Self Care | Payer: PPO | Source: Ambulatory Visit | Attending: Radiation Oncology | Admitting: Radiation Oncology

## 2017-02-26 DIAGNOSIS — Z510 Encounter for antineoplastic radiation therapy: Secondary | ICD-10-CM | POA: Diagnosis not present

## 2017-02-26 DIAGNOSIS — C61 Malignant neoplasm of prostate: Secondary | ICD-10-CM | POA: Diagnosis not present

## 2017-02-27 ENCOUNTER — Ambulatory Visit
Admission: RE | Admit: 2017-02-27 | Discharge: 2017-02-27 | Disposition: A | Discharge status: Home / Self Care | Payer: PPO | Source: Ambulatory Visit | Attending: Radiation Oncology | Admitting: Radiation Oncology

## 2017-02-27 ENCOUNTER — Inpatient Hospital Stay: Payer: PPO

## 2017-02-27 DIAGNOSIS — Z510 Encounter for antineoplastic radiation therapy: Secondary | ICD-10-CM | POA: Diagnosis not present

## 2017-02-27 DIAGNOSIS — C61 Malignant neoplasm of prostate: Secondary | ICD-10-CM | POA: Diagnosis not present

## 2017-02-27 DIAGNOSIS — Z0000 Encounter for general adult medical examination without abnormal findings: Secondary | ICD-10-CM | POA: Diagnosis not present

## 2017-02-27 LAB — CBC
HCT: 46 % (ref 40.0–52.0)
Hemoglobin: 15.9 g/dL (ref 13.0–18.0)
MCH: 30.5 pg (ref 26.0–34.0)
MCHC: 34.6 g/dL (ref 32.0–36.0)
MCV: 88.2 fL (ref 80.0–100.0)
PLATELETS: 253 10*3/uL (ref 150–440)
RBC: 5.22 MIL/uL (ref 4.40–5.90)
RDW: 16.9 % — ABNORMAL HIGH (ref 11.5–14.5)
WBC: 8.3 10*3/uL (ref 3.8–10.6)

## 2017-03-02 ENCOUNTER — Ambulatory Visit
Admission: RE | Admit: 2017-03-02 | Discharge: 2017-03-02 | Disposition: A | Discharge status: Home / Self Care | Payer: PPO | Source: Ambulatory Visit | Attending: Radiation Oncology | Admitting: Radiation Oncology

## 2017-03-02 DIAGNOSIS — C61 Malignant neoplasm of prostate: Secondary | ICD-10-CM | POA: Diagnosis not present

## 2017-03-02 DIAGNOSIS — Z510 Encounter for antineoplastic radiation therapy: Secondary | ICD-10-CM | POA: Diagnosis not present

## 2017-03-03 ENCOUNTER — Other Ambulatory Visit: Payer: Self-pay | Admitting: *Deleted

## 2017-03-03 ENCOUNTER — Ambulatory Visit
Admission: RE | Admit: 2017-03-03 | Discharge: 2017-03-03 | Disposition: A | Discharge status: Home / Self Care | Payer: PPO | Source: Ambulatory Visit | Attending: Radiation Oncology | Admitting: Radiation Oncology

## 2017-03-03 DIAGNOSIS — C61 Malignant neoplasm of prostate: Secondary | ICD-10-CM | POA: Diagnosis not present

## 2017-03-03 DIAGNOSIS — Z510 Encounter for antineoplastic radiation therapy: Secondary | ICD-10-CM | POA: Diagnosis not present

## 2017-03-03 MED ORDER — PHENAZOPYRIDINE HCL 200 MG PO TABS: 200.0000 mg | ORAL_TABLET | Freq: Three times a day (TID) | ORAL | 1 refills | Status: DC | PRN

## 2017-03-04 ENCOUNTER — Ambulatory Visit
Admission: RE | Admit: 2017-03-04 | Discharge: 2017-03-04 | Disposition: A | Discharge status: Home / Self Care | Payer: PPO | Source: Ambulatory Visit | Attending: Radiation Oncology | Admitting: Radiation Oncology

## 2017-03-04 DIAGNOSIS — C61 Malignant neoplasm of prostate: Secondary | ICD-10-CM | POA: Diagnosis not present

## 2017-03-04 DIAGNOSIS — Z510 Encounter for antineoplastic radiation therapy: Secondary | ICD-10-CM | POA: Diagnosis not present

## 2017-03-05 ENCOUNTER — Ambulatory Visit
Admission: RE | Admit: 2017-03-05 | Discharge: 2017-03-05 | Disposition: A | Discharge status: Home / Self Care | Payer: PPO | Source: Ambulatory Visit | Attending: Radiation Oncology | Admitting: Radiation Oncology

## 2017-03-05 DIAGNOSIS — Z510 Encounter for antineoplastic radiation therapy: Secondary | ICD-10-CM | POA: Diagnosis not present

## 2017-03-05 DIAGNOSIS — C61 Malignant neoplasm of prostate: Secondary | ICD-10-CM | POA: Diagnosis not present

## 2017-03-06 ENCOUNTER — Ambulatory Visit
Admission: RE | Admit: 2017-03-06 | Discharge: 2017-03-06 | Disposition: A | Discharge status: Home / Self Care | Payer: PPO | Source: Ambulatory Visit | Attending: Radiation Oncology | Admitting: Radiation Oncology

## 2017-03-06 DIAGNOSIS — C61 Malignant neoplasm of prostate: Secondary | ICD-10-CM | POA: Diagnosis not present

## 2017-03-06 DIAGNOSIS — Z510 Encounter for antineoplastic radiation therapy: Secondary | ICD-10-CM | POA: Diagnosis not present

## 2017-03-09 ENCOUNTER — Ambulatory Visit
Admission: RE | Admit: 2017-03-09 | Discharge: 2017-03-09 | Disposition: A | Discharge status: Home / Self Care | Payer: PPO | Source: Ambulatory Visit | Attending: Radiation Oncology | Admitting: Radiation Oncology

## 2017-03-09 DIAGNOSIS — C61 Malignant neoplasm of prostate: Secondary | ICD-10-CM | POA: Diagnosis not present

## 2017-03-09 DIAGNOSIS — Z510 Encounter for antineoplastic radiation therapy: Secondary | ICD-10-CM | POA: Diagnosis not present

## 2017-03-10 ENCOUNTER — Ambulatory Visit
Admission: RE | Admit: 2017-03-10 | Discharge: 2017-03-10 | Disposition: A | Discharge status: Home / Self Care | Payer: PPO | Source: Ambulatory Visit | Attending: Radiation Oncology | Admitting: Radiation Oncology

## 2017-03-10 DIAGNOSIS — C61 Malignant neoplasm of prostate: Secondary | ICD-10-CM | POA: Diagnosis not present

## 2017-03-10 DIAGNOSIS — Z510 Encounter for antineoplastic radiation therapy: Secondary | ICD-10-CM | POA: Diagnosis not present

## 2017-03-11 ENCOUNTER — Ambulatory Visit: Payer: PPO

## 2017-03-11 ENCOUNTER — Ambulatory Visit
Admission: RE | Admit: 2017-03-11 | Discharge: 2017-03-11 | Disposition: A | Discharge status: Home / Self Care | Payer: PPO | Source: Ambulatory Visit | Attending: Radiation Oncology | Admitting: Radiation Oncology

## 2017-03-11 DIAGNOSIS — C61 Malignant neoplasm of prostate: Secondary | ICD-10-CM | POA: Diagnosis not present

## 2017-03-11 DIAGNOSIS — Z510 Encounter for antineoplastic radiation therapy: Secondary | ICD-10-CM | POA: Diagnosis not present

## 2017-03-12 ENCOUNTER — Ambulatory Visit
Admission: RE | Admit: 2017-03-12 | Discharge: 2017-03-12 | Disposition: A | Discharge status: Home / Self Care | Payer: PPO | Source: Ambulatory Visit | Attending: Radiation Oncology | Admitting: Radiation Oncology

## 2017-03-12 DIAGNOSIS — C61 Malignant neoplasm of prostate: Secondary | ICD-10-CM | POA: Diagnosis not present

## 2017-03-12 DIAGNOSIS — Z510 Encounter for antineoplastic radiation therapy: Secondary | ICD-10-CM | POA: Diagnosis not present

## 2017-04-06 DIAGNOSIS — M79674 Pain in right toe(s): Secondary | ICD-10-CM | POA: Diagnosis not present

## 2017-04-06 DIAGNOSIS — M79675 Pain in left toe(s): Secondary | ICD-10-CM | POA: Diagnosis not present

## 2017-04-06 DIAGNOSIS — E1142 Type 2 diabetes mellitus with diabetic polyneuropathy: Secondary | ICD-10-CM | POA: Diagnosis not present

## 2017-04-06 DIAGNOSIS — B351 Tinea unguium: Secondary | ICD-10-CM | POA: Diagnosis not present

## 2017-04-15 ENCOUNTER — Ambulatory Visit
Admission: RE | Admit: 2017-04-15 | Discharge: 2017-04-15 | Disposition: A | Discharge status: Home / Self Care | Payer: PPO | Source: Ambulatory Visit | Attending: Radiation Oncology | Admitting: Radiation Oncology

## 2017-04-15 ENCOUNTER — Encounter: Payer: Self-pay | Admitting: Radiation Oncology

## 2017-04-15 VITALS — BP 148/73 | HR 85 | Temp 98.0°F | Wt 162.3 lb

## 2017-04-15 DIAGNOSIS — C61 Malignant neoplasm of prostate: Secondary | ICD-10-CM

## 2017-04-15 DIAGNOSIS — Z923 Personal history of irradiation: Secondary | ICD-10-CM | POA: Insufficient documentation

## 2017-04-15 NOTE — Progress Notes (Signed)
Radiation Oncology Follow up Note  Name: [name]   Date:   04/15/2017 MRN:  [id number] DOB: 06/13/1946    This 71 y.o. male presents to the clinic today for one-month follow-up status post I MRT radiation therapy for a Gleason 7 adenocarcinoma the prostate.  REFERRING PROVIDER: [name], MD  HPI: Patient is a 71 year old male now out 1 month having completed. Image guided I MRT radiation therapy to his prostate for a Gleason 7 adenocarcinoma presenting with a PSA of 6.5. He is seen today in routine follow-up and is doing well. He had one episode of rectal incontinence. Specifically denies increased lower urinary tract symptoms or any significant diarrhea at this time.  COMPLICATIONS OF TREATMENT: none  FOLLOW UP COMPLIANCE: keeps appointments   PHYSICAL EXAM:  BP (!) 148/73   Pulse 85   Temp 98 F (36.7 C)   Wt 162 lb 4.1 oz (73.6 kg)   BMI 26.19 kg/m  On rectal exam rectal sphincter tone is good. Prostate is smooth contracted without evidence of nodularity or mass. Sulcus is preserved bilaterally. No discrete nodularity is identified. No other rectal abnormalities are noted. Well-developed well-nourished patient in NAD. HEENT reveals PERLA, EOMI, discs not visualized.  Oral cavity is clear. No oral mucosal lesions are identified. Neck is clear without evidence of cervical or supraclavicular adenopathy. Lungs are clear to A&P. Cardiac examination is essentially unremarkable with regular rate and rhythm without murmur rub or thrill. Abdomen is benign with no organomegaly or masses noted. Motor sensory and DTR levels are equal and symmetric in the upper and lower extremities. Cranial nerves II through XII are grossly intact. Proprioception is intact. No peripheral adenopathy or edema is identified. No motor or sensory levels are noted. Crude visual fields are within normal range.  RADIOLOGY RESULTS: No current films for review  PLAN: At this time patient is recovering nicely from  his prostate radiation therapy. I'm please was overall progress. I have talked to about some [place] exercises to strengthen his pelvic floor muscles to help with some of his urinary retention as well as rectal retention. I will also explained to him our protocol for following PSA and vas to see him back in 3-4 months with a PSA prior to that visit. Patient knows to call with any concerns.  I would like to take this opportunity to thank you for allowing me to participate in the care of your patient.Armstead Peaks., MD

## 2017-04-29 DIAGNOSIS — E119 Type 2 diabetes mellitus without complications: Secondary | ICD-10-CM | POA: Diagnosis not present

## 2017-04-29 DIAGNOSIS — H26492 Other secondary cataract, left eye: Secondary | ICD-10-CM | POA: Diagnosis not present

## 2017-04-29 DIAGNOSIS — H5021 Vertical strabismus, right eye: Secondary | ICD-10-CM | POA: Diagnosis not present

## 2017-05-05 DIAGNOSIS — I25810 Atherosclerosis of coronary artery bypass graft(s) without angina pectoris: Secondary | ICD-10-CM | POA: Diagnosis not present

## 2017-05-05 DIAGNOSIS — I5022 Chronic systolic (congestive) heart failure: Secondary | ICD-10-CM | POA: Diagnosis not present

## 2017-05-05 DIAGNOSIS — E782 Mixed hyperlipidemia: Secondary | ICD-10-CM | POA: Diagnosis not present

## 2017-05-05 DIAGNOSIS — I739 Peripheral vascular disease, unspecified: Secondary | ICD-10-CM | POA: Diagnosis not present

## 2017-05-28 DIAGNOSIS — Z8679 Personal history of other diseases of the circulatory system: Secondary | ICD-10-CM | POA: Diagnosis not present

## 2017-05-28 DIAGNOSIS — Z794 Long term (current) use of insulin: Secondary | ICD-10-CM | POA: Diagnosis not present

## 2017-05-28 DIAGNOSIS — E119 Type 2 diabetes mellitus without complications: Secondary | ICD-10-CM | POA: Diagnosis not present

## 2017-05-28 DIAGNOSIS — R262 Difficulty in walking, not elsewhere classified: Secondary | ICD-10-CM | POA: Diagnosis not present

## 2017-05-28 DIAGNOSIS — H539 Unspecified visual disturbance: Secondary | ICD-10-CM | POA: Diagnosis not present

## 2017-05-28 DIAGNOSIS — Z720 Tobacco use: Secondary | ICD-10-CM | POA: Diagnosis not present

## 2017-05-28 DIAGNOSIS — M542 Cervicalgia: Secondary | ICD-10-CM | POA: Diagnosis not present

## 2017-08-17 DIAGNOSIS — Z23 Encounter for immunization: Secondary | ICD-10-CM | POA: Diagnosis not present

## 2017-08-17 DIAGNOSIS — M79644 Pain in right finger(s): Secondary | ICD-10-CM | POA: Diagnosis not present

## 2017-08-18 DIAGNOSIS — M66241 Spontaneous rupture of extensor tendons, right hand: Secondary | ICD-10-CM | POA: Diagnosis not present

## 2017-08-19 ENCOUNTER — Inpatient Hospital Stay: Payer: PPO | Attending: Radiation Oncology

## 2017-08-19 ENCOUNTER — Other Ambulatory Visit: Payer: Self-pay

## 2017-08-19 DIAGNOSIS — C61 Malignant neoplasm of prostate: Secondary | ICD-10-CM

## 2017-08-19 LAB — PSA: PROSTATIC SPECIFIC ANTIGEN: 0.71 ng/mL (ref 0.00–4.00)

## 2017-08-20 DIAGNOSIS — I5022 Chronic systolic (congestive) heart failure: Secondary | ICD-10-CM | POA: Diagnosis not present

## 2017-08-21 DIAGNOSIS — E7800 Pure hypercholesterolemia, unspecified: Secondary | ICD-10-CM | POA: Diagnosis not present

## 2017-08-21 DIAGNOSIS — E119 Type 2 diabetes mellitus without complications: Secondary | ICD-10-CM | POA: Diagnosis not present

## 2017-08-21 DIAGNOSIS — Z79899 Other long term (current) drug therapy: Secondary | ICD-10-CM | POA: Diagnosis not present

## 2017-08-26 ENCOUNTER — Other Ambulatory Visit: Payer: Self-pay | Admitting: *Deleted

## 2017-08-26 ENCOUNTER — Encounter: Payer: Self-pay | Admitting: Radiation Oncology

## 2017-08-26 ENCOUNTER — Ambulatory Visit
Admission: RE | Admit: 2017-08-26 | Discharge: 2017-08-26 | Disposition: A | Discharge status: Home / Self Care | Payer: PPO | Source: Ambulatory Visit | Attending: Radiation Oncology | Admitting: Radiation Oncology

## 2017-08-26 VITALS — BP 171/76 | HR 65 | Temp 97.4°F | Resp 18 | Wt 174.9 lb

## 2017-08-26 DIAGNOSIS — C61 Malignant neoplasm of prostate: Secondary | ICD-10-CM

## 2017-08-26 DIAGNOSIS — Z923 Personal history of irradiation: Secondary | ICD-10-CM | POA: Diagnosis not present

## 2017-08-26 NOTE — Progress Notes (Signed)
Radiation Oncology Follow up Note  Name: [name]   Date:   08/26/2017 MRN:  [id number] DOB: 11/15/1945    This 71 y.o. male presents to the clinic today for four-month follow-up status post I MRT radiation therapy for Gleason 7 adenocarcinoma the prostate.  REFERRING PROVIDER: [name], MD  HPI: Patient is a 71 year old male now out 4 months having completed IM RT radiation therapy for Gleason 7 adenocarcinoma prostate presenting the PSA of 6.5. He is seen today in routine follow-up and is doing well. He specifically denies diarrhea dysuria or any other GI/GU complaints.. His latest PSA performed this week was 0.7  COMPLICATIONS OF TREATMENT: none  FOLLOW UP COMPLIANCE: keeps appointments   PHYSICAL EXAM:  BP (!) 171/76   Pulse 65   Temp (!) 97.4 F (36.3 C)   Resp 18   Wt 174 lb 15 oz (79.3 kg)   BMI 28.24 kg/m  On rectal exam rectal sphincter tone is good. Prostate is smooth contracted without evidence of nodularity or mass. Sulcus is preserved bilaterally. No discrete nodularity is identified. No other rectal abnormalities are noted. Well-developed well-nourished patient in NAD. HEENT reveals PERLA, EOMI, discs not visualized.  Oral cavity is clear. No oral mucosal lesions are identified. Neck is clear without evidence of cervical or supraclavicular adenopathy. Lungs are clear to A&P. Cardiac examination is essentially unremarkable with regular rate and rhythm without murmur rub or thrill. Abdomen is benign with no organomegaly or masses noted. Motor sensory and DTR levels are equal and symmetric in the upper and lower extremities. Cranial nerves II through XII are grossly intact. Proprioception is intact. No peripheral adenopathy or edema is identified. No motor or sensory levels are noted. Crude visual fields are within normal range.  RADIOLOGY RESULTS: No current films for review  PLAN: Present time patient is doing well under good biochemical control of his prostate  cancer. He is a very low side effect profile. I'm please was overall progress. I've asked to see him back in 6 months for follow-up and will perform a PSA prior to that visit. Patient knows to call sooner with any concerns.  I would like to take this opportunity to thank you for allowing me to participate in the care of your patient.Armstead Peaks., MD

## 2017-08-28 DIAGNOSIS — I10 Essential (primary) hypertension: Secondary | ICD-10-CM | POA: Diagnosis not present

## 2017-08-28 DIAGNOSIS — Z79899 Other long term (current) drug therapy: Secondary | ICD-10-CM | POA: Diagnosis not present

## 2017-08-28 DIAGNOSIS — E1122 Type 2 diabetes mellitus with diabetic chronic kidney disease: Secondary | ICD-10-CM | POA: Diagnosis not present

## 2017-08-28 DIAGNOSIS — Z794 Long term (current) use of insulin: Secondary | ICD-10-CM | POA: Diagnosis not present

## 2017-08-28 DIAGNOSIS — I2510 Atherosclerotic heart disease of native coronary artery without angina pectoris: Secondary | ICD-10-CM | POA: Diagnosis not present

## 2017-08-28 DIAGNOSIS — E7800 Pure hypercholesterolemia, unspecified: Secondary | ICD-10-CM | POA: Diagnosis not present

## 2017-08-28 DIAGNOSIS — N183 Chronic kidney disease, stage 3 (moderate): Secondary | ICD-10-CM | POA: Diagnosis not present

## 2017-08-28 DIAGNOSIS — Z125 Encounter for screening for malignant neoplasm of prostate: Secondary | ICD-10-CM | POA: Diagnosis not present

## 2017-10-02 DIAGNOSIS — M66241 Spontaneous rupture of extensor tendons, right hand: Secondary | ICD-10-CM | POA: Diagnosis not present

## 2017-10-09 DIAGNOSIS — M79644 Pain in right finger(s): Secondary | ICD-10-CM | POA: Diagnosis not present

## 2017-10-19 DIAGNOSIS — M79644 Pain in right finger(s): Secondary | ICD-10-CM | POA: Diagnosis not present

## 2017-10-23 DIAGNOSIS — M79644 Pain in right finger(s): Secondary | ICD-10-CM | POA: Diagnosis not present

## 2017-10-29 ENCOUNTER — Other Ambulatory Visit: Payer: Self-pay

## 2017-10-29 MED ORDER — TAMSULOSIN HCL 0.4 MG PO CAPS: 0.4000 mg | ORAL_CAPSULE | Freq: Every day | ORAL | 11 refills | Status: DC

## 2017-10-29 MED ORDER — TAMSULOSIN HCL 0.4 MG PO CAPS: 0.4000 mg | ORAL_CAPSULE | Freq: Every day | ORAL | 6 refills | Status: DC

## 2017-10-30 DIAGNOSIS — M79644 Pain in right finger(s): Secondary | ICD-10-CM | POA: Diagnosis not present

## 2017-11-06 DIAGNOSIS — M79644 Pain in right finger(s): Secondary | ICD-10-CM | POA: Diagnosis not present

## 2017-11-10 DIAGNOSIS — I5022 Chronic systolic (congestive) heart failure: Secondary | ICD-10-CM | POA: Diagnosis not present

## 2017-11-10 DIAGNOSIS — I2510 Atherosclerotic heart disease of native coronary artery without angina pectoris: Secondary | ICD-10-CM | POA: Diagnosis not present

## 2017-11-10 DIAGNOSIS — I10 Essential (primary) hypertension: Secondary | ICD-10-CM | POA: Diagnosis not present

## 2017-11-10 DIAGNOSIS — E782 Mixed hyperlipidemia: Secondary | ICD-10-CM | POA: Diagnosis not present

## 2017-11-10 DIAGNOSIS — I25810 Atherosclerosis of coronary artery bypass graft(s) without angina pectoris: Secondary | ICD-10-CM | POA: Diagnosis not present

## 2017-12-02 ENCOUNTER — Ambulatory Visit
Admission: RE | Admit: 2017-12-02 | Discharge: 2017-12-02 | Disposition: A | Discharge status: Home / Self Care | Payer: PPO | Source: Ambulatory Visit | Attending: "Endocrinology | Admitting: "Endocrinology

## 2017-12-02 ENCOUNTER — Other Ambulatory Visit: Payer: Self-pay | Admitting: "Endocrinology

## 2017-12-02 DIAGNOSIS — R600 Localized edema: Secondary | ICD-10-CM | POA: Insufficient documentation

## 2017-12-02 DIAGNOSIS — R609 Edema, unspecified: Secondary | ICD-10-CM

## 2017-12-03 DIAGNOSIS — R278 Other lack of coordination: Secondary | ICD-10-CM | POA: Diagnosis not present

## 2017-12-03 DIAGNOSIS — Z8679 Personal history of other diseases of the circulatory system: Secondary | ICD-10-CM | POA: Diagnosis not present

## 2017-12-03 DIAGNOSIS — Z720 Tobacco use: Secondary | ICD-10-CM | POA: Diagnosis not present

## 2017-12-03 DIAGNOSIS — E119 Type 2 diabetes mellitus without complications: Secondary | ICD-10-CM | POA: Diagnosis not present

## 2017-12-03 DIAGNOSIS — R262 Difficulty in walking, not elsewhere classified: Secondary | ICD-10-CM | POA: Diagnosis not present

## 2017-12-03 DIAGNOSIS — Z794 Long term (current) use of insulin: Secondary | ICD-10-CM | POA: Diagnosis not present

## 2017-12-03 DIAGNOSIS — M542 Cervicalgia: Secondary | ICD-10-CM | POA: Diagnosis not present

## 2017-12-03 DIAGNOSIS — H539 Unspecified visual disturbance: Secondary | ICD-10-CM | POA: Diagnosis not present

## 2017-12-04 DIAGNOSIS — M6620 Spontaneous rupture of extensor tendons, unspecified site: Secondary | ICD-10-CM | POA: Diagnosis not present

## 2017-12-17 DIAGNOSIS — B351 Tinea unguium: Secondary | ICD-10-CM | POA: Diagnosis not present

## 2017-12-17 DIAGNOSIS — E1142 Type 2 diabetes mellitus with diabetic polyneuropathy: Secondary | ICD-10-CM | POA: Diagnosis not present

## 2017-12-17 DIAGNOSIS — R600 Localized edema: Secondary | ICD-10-CM | POA: Diagnosis not present

## 2018-02-17 ENCOUNTER — Inpatient Hospital Stay: Payer: PPO | Attending: Radiation Oncology

## 2018-02-17 DIAGNOSIS — C61 Malignant neoplasm of prostate: Secondary | ICD-10-CM | POA: Insufficient documentation

## 2018-02-17 LAB — PSA: PROSTATIC SPECIFIC ANTIGEN: 0.81 ng/mL (ref 0.00–4.00)

## 2018-02-23 DIAGNOSIS — E7800 Pure hypercholesterolemia, unspecified: Secondary | ICD-10-CM | POA: Diagnosis not present

## 2018-02-23 DIAGNOSIS — E1122 Type 2 diabetes mellitus with diabetic chronic kidney disease: Secondary | ICD-10-CM | POA: Diagnosis not present

## 2018-02-23 DIAGNOSIS — Z794 Long term (current) use of insulin: Secondary | ICD-10-CM | POA: Diagnosis not present

## 2018-02-23 DIAGNOSIS — Z125 Encounter for screening for malignant neoplasm of prostate: Secondary | ICD-10-CM | POA: Diagnosis not present

## 2018-02-23 DIAGNOSIS — N183 Chronic kidney disease, stage 3 (moderate): Secondary | ICD-10-CM | POA: Diagnosis not present

## 2018-02-23 DIAGNOSIS — Z79899 Other long term (current) drug therapy: Secondary | ICD-10-CM | POA: Diagnosis not present

## 2018-02-24 ENCOUNTER — Ambulatory Visit
Admission: RE | Admit: 2018-02-24 | Discharge: 2018-02-24 | Disposition: A | Discharge status: Home / Self Care | Payer: PPO | Source: Ambulatory Visit | Attending: Radiation Oncology | Admitting: Radiation Oncology

## 2018-02-24 ENCOUNTER — Encounter: Payer: Self-pay | Admitting: Radiation Oncology

## 2018-02-24 ENCOUNTER — Other Ambulatory Visit: Payer: Self-pay

## 2018-02-24 VITALS — BP 161/75 | HR 71 | Temp 96.1°F | Resp 18 | Wt 175.3 lb

## 2018-02-24 DIAGNOSIS — C61 Malignant neoplasm of prostate: Secondary | ICD-10-CM | POA: Diagnosis not present

## 2018-02-24 DIAGNOSIS — Z923 Personal history of irradiation: Secondary | ICD-10-CM | POA: Diagnosis not present

## 2018-02-24 NOTE — Progress Notes (Signed)
Radiation Oncology Follow up Note  Name: [name]   Date:   02/24/2018 MRN:  [id number] DOB: [birth year]    This 72 y.o. male presents to the clinic today for 1 year follow-up status post I MRT ration therapy for Gleason 7 adenocarcinoma the prostate.  REFERRING PROVIDER: [name], MD  HPI: patient is a 72 year old male now out close to 1 year having completed IM RT radiation therapy to his prostate for Gleason 7 adenocarcinoma the prostate presenting the PSA of 6.5. He is seen today in routine follow-up and is doing well. He does state he has occasional intermittent diarrhea no increased lower urinary tract symptoms no nocturia. His most recent PSA was 0.81.Marland Kitchen  COMPLICATIONS OF TREATMENT: none  FOLLOW UP COMPLIANCE: keeps appointments   PHYSICAL EXAM:  BP (!) 161/75   Pulse 71   Temp (!) 96.1 F (35.6 C)   Resp 18   Wt 175 lb 4.3 oz (79.5 kg)   BMI 28.29 kg/m  Well-developed well-nourished patient in NAD. HEENT reveals PERLA, EOMI, discs not visualized.  Oral cavity is clear. No oral mucosal lesions are identified. Neck is clear without evidence of cervical or supraclavicular adenopathy. Lungs are clear to A&P. Cardiac examination is essentially unremarkable with regular rate and rhythm without murmur rub or thrill. Abdomen is benign with no organomegaly or masses noted. Motor sensory and DTR levels are equal and symmetric in the upper and lower extremities. Cranial nerves II through XII are grossly intact. Proprioception is intact. No peripheral adenopathy or edema is identified. No motor or sensory levels are noted. Crude visual fields are within normal range.  RADIOLOGY RESULTS: no current films for review  PLAN: resent time patient is under excellent biochemical control of his prostate cancer. I'm please was overall progress. I think we can go to once your follow-up with PSA prior to his visit. Patient is to call with any concerns.  I would like to take this opportunity to  thank you for allowing me to participate in the care of your patient.Noreene Filbert, MD

## 2018-03-02 DIAGNOSIS — Z0000 Encounter for general adult medical examination without abnormal findings: Secondary | ICD-10-CM | POA: Diagnosis not present

## 2018-03-15 IMAGING — MR MR HEAD WO/W CM
12 series · 48 of 48 positions shown · IV contrast (multihance)
Comparison: 12/21/2013 MRI of the brain.

CLINICAL DATA: 70 y/o M; nausea, vomiting, memory loss, and gait
disturbance.

EXAM:
MRI HEAD WITHOUT AND WITH CONTRAST
TECHNIQUE: Multiplanar, multiecho pulse sequences of the brain and surrounding
structures were obtained without and with intravenous contrast.
CONTRAST:  7mL MULTIHANCE GADOBENATE DIMEGLUMINE 529 MG/ML IV SOLN

[Series 2: T1 · sagittal · 5.0mm · 0.45mm/px · 1 of 23 slices shown (1 of 2)]
[im 1/23]
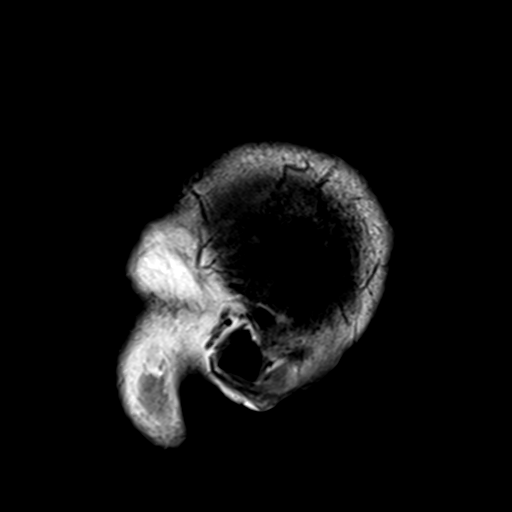

[Series 4: DWI · axial · 3.0mm · 1.20mm/px · z∈[-26,+138]mm · 5 of 58 slices shown (1 of 4)]
[im 1/58]
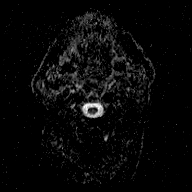
[im 15/58]
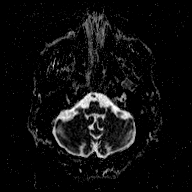
[im 29/58]
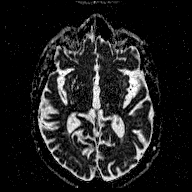
[im 43/58]
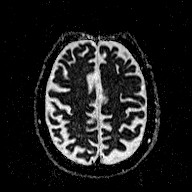
[im 58/58]
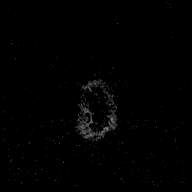

[Series 6: DWI · coronal · 3.0mm · 1.20mm/px · 4 of 48 slices shown (2 of 4)]
[im 1/48]
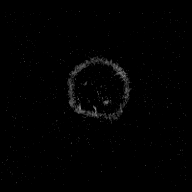
[im 16/48]
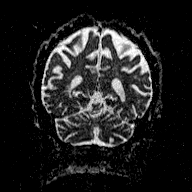
[im 32/48]
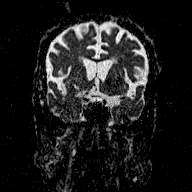
[im 48/48]
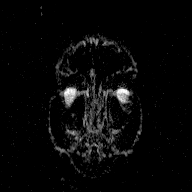

[Series 7: T2 · axial · 5.0mm · 0.72mm/px · z∈[-28,+140]mm · 3 of 28 slices shown (1 of 2)]
[im 1/28]
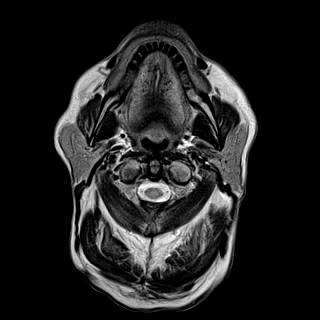
[im 14/28]
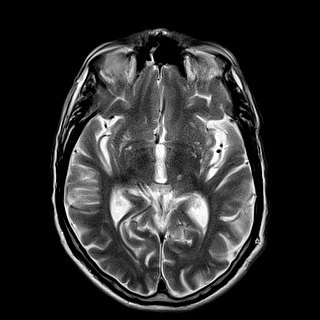
[im 28/28]
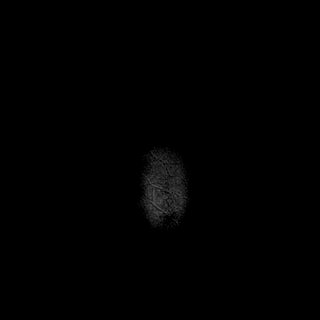

[Series 8: FLAIR · axial · 5.0mm · 0.45mm/px · z∈[-28,+140]mm · 3 of 28 slices shown]
[im 1/28]
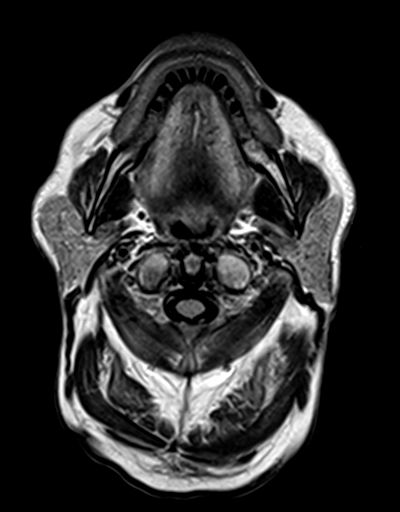
[im 14/28]
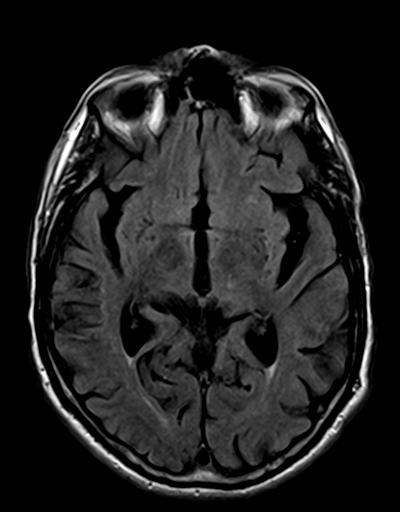
[im 28/28]
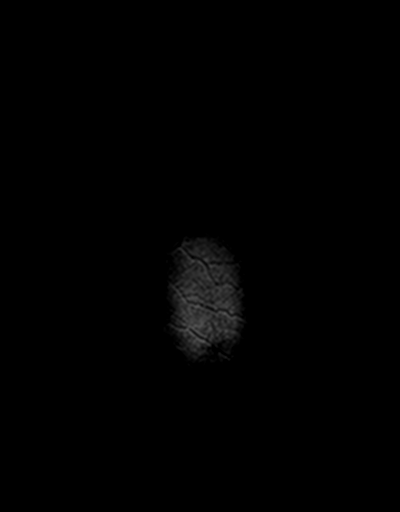

[Series 9: T2 · axial · 5.0mm · 0.72mm/px · z∈[-28,+140]mm · 3 of 28 slices shown (2 of 2)]
[im 1/28]
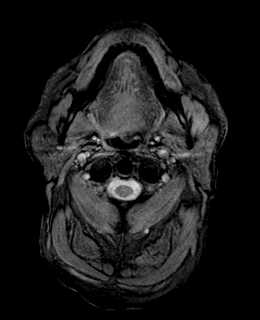
[im 14/28]
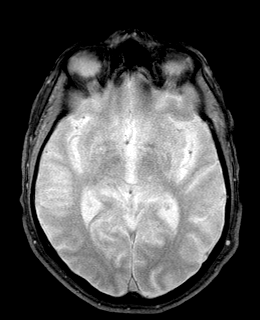
[im 28/28]
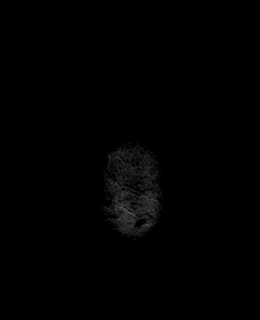

[Series 10: T1 · axial · 3.0mm · 1.00mm/px · z∈[-31,+150]mm · 6 of 64 slices shown (2 of 2)]
[im 1/64]
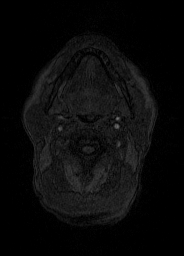
[im 13/64]
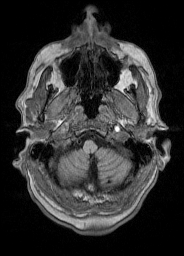
[im 26/64]
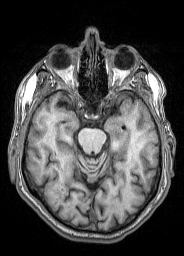
[im 38/64]
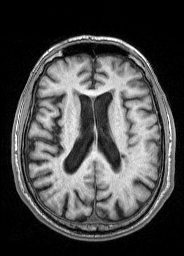
[im 51/64]
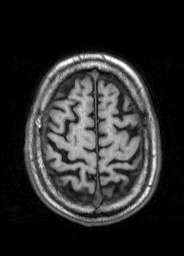
[im 64/64]
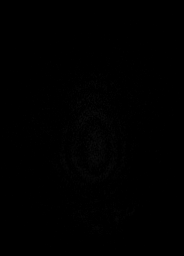

[Series 11: T2 post-contrast · coronal · 5.0mm · 0.45mm/px · 3 of 29 slices shown]
[im 1/29]
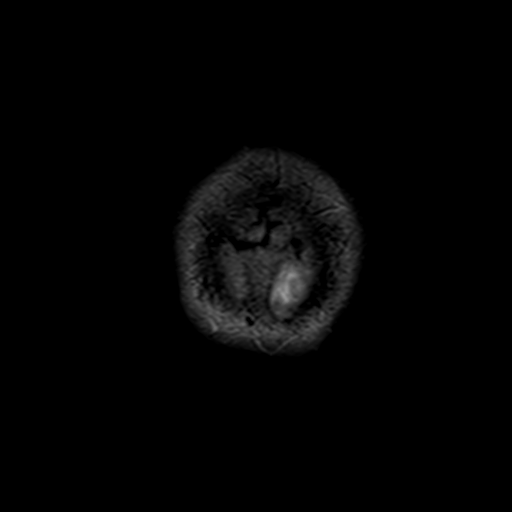
[im 15/29]
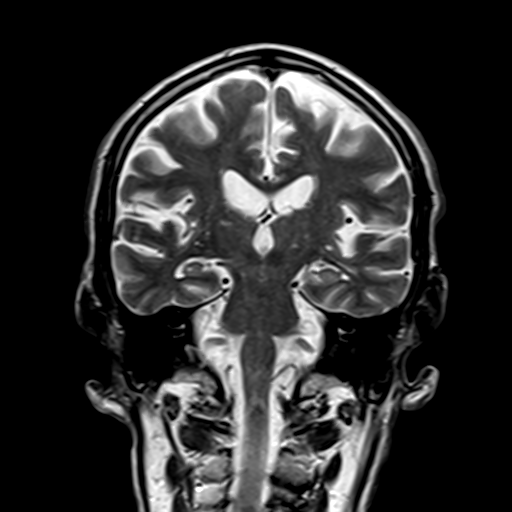
[im 29/29]
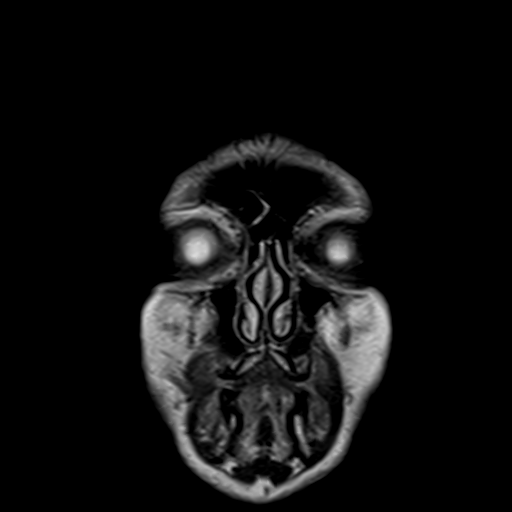

[Series 12: T1 post-contrast · axial · 3.0mm · 1.00mm/px · z∈[-31,+150]mm · 6 of 64 slices shown (1 of 2)]
[im 1/64]
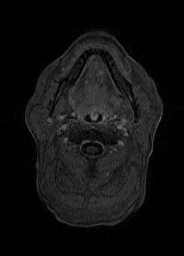
[im 13/64]
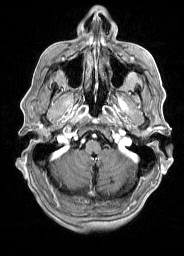
[im 26/64]
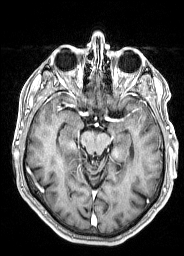
[im 38/64]
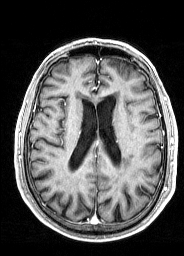
[im 51/64]
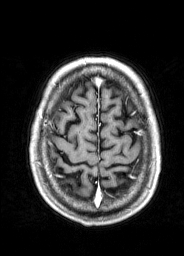
[im 64/64]
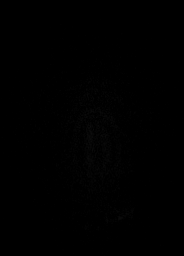

[Series 13: T1 post-contrast · coronal · 5.0mm · 0.45mm/px · 3 of 29 slices shown (2 of 2)]
[im 1/29]
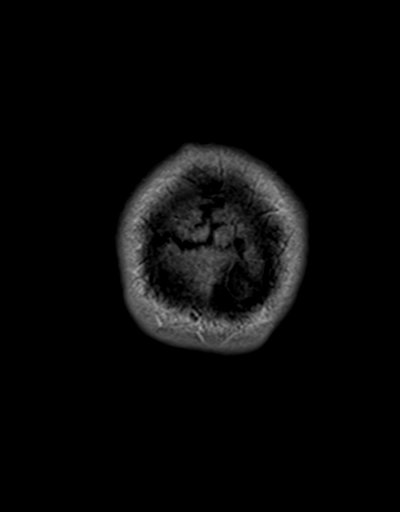
[im 15/29]
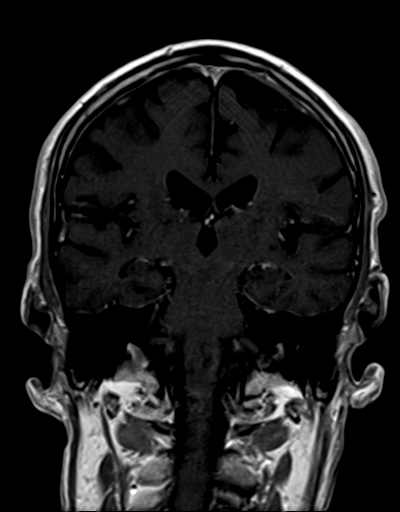
[im 29/29]
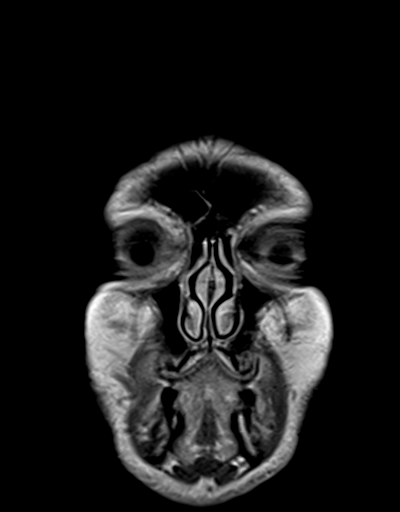

[Series 100: DWI · axial · 3.0mm · 1.20mm/px · z∈[-26,+138]mm · 6 of 58 slices shown (3 of 4)]
[im 1/58]
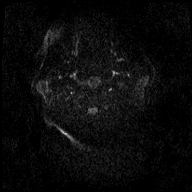
[im 12/58]
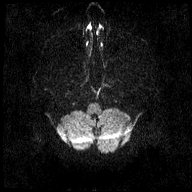
[im 23/58]
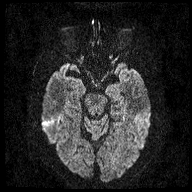
[im 35/58]
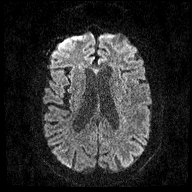
[im 46/58]
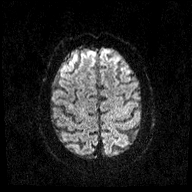
[im 58/58]
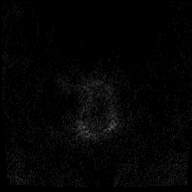

[Series 101: DWI · coronal · 3.0mm · 1.20mm/px · 5 of 48 slices shown (4 of 4)]
[im 1/48]
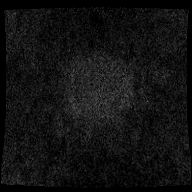
[im 12/48]
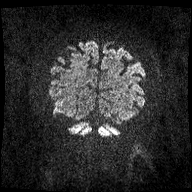
[im 24/48]
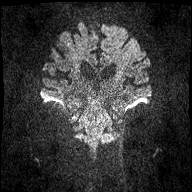
[im 36/48]
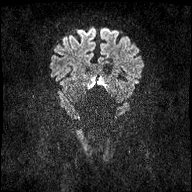
[im 48/48]
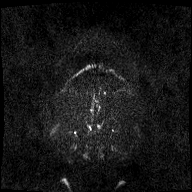

[48 of 48 positions shown; findings below may reference images not displayed]

FINDINGS: Brain: No diffusion signal abnormality. Chronic left cerebellar
small lacunar infarct and scattered T2 hyperintense foci in the
basal ganglia bilaterally probably representing a combination of
lacunar infarcts and prominent perivascular spaces. T2 FLAIR
hyperintense signal abnormality predominantly in periventricular
white matter is stable and consistent moderate chronic microvascular
ischemic changes. Slightly heterogeneous T2 FLAIR hyperintense
signal of midbrain is likely related to prior infarction. There is
mild to moderate parenchymal volume loss which has slightly
progressed in comparison with the prior MRI of the brain. Punctate
focus of susceptibility hypointensity within the central midbrain
likely represents hemosiderin deposition from prior microhemorrhage.
No focal mass effect. No abnormal enhancement. Ventricle size is
commensurate to the degree of volume loss.

Vascular: Normal flow voids and enhancement.

Skull and upper cervical spine: Normal marrow signal.

Sinuses/Orbits: Mucosal thickening greatest in right frontal and
maxillary sinuses. Small maxillary sinus fluid level. No abnormal
signal of mastoid air cells. Bilateral intra-ocular lens
replacement.

Other: None.
IMPRESSION: 1. No acute intracranial abnormality is identified.
2. Stable moderate chronic microvascular ischemic changes and
chronic small lacunar infarcts of basal ganglia, brainstem, and left
cerebellum.
3. Mild to moderate parenchymal volume loss is progressed from 8428.
4. Mild paranasal sinus disease with nonspecific fluid levels which
may represent acute sinusitis in the appropriate clinical setting.

By: Mariolys Kon M.D.

## 2018-04-01 DIAGNOSIS — Z794 Long term (current) use of insulin: Secondary | ICD-10-CM | POA: Diagnosis not present

## 2018-04-01 DIAGNOSIS — Z720 Tobacco use: Secondary | ICD-10-CM | POA: Diagnosis not present

## 2018-04-01 DIAGNOSIS — R262 Difficulty in walking, not elsewhere classified: Secondary | ICD-10-CM | POA: Diagnosis not present

## 2018-04-01 DIAGNOSIS — H539 Unspecified visual disturbance: Secondary | ICD-10-CM | POA: Diagnosis not present

## 2018-04-01 DIAGNOSIS — M542 Cervicalgia: Secondary | ICD-10-CM | POA: Diagnosis not present

## 2018-04-01 DIAGNOSIS — E119 Type 2 diabetes mellitus without complications: Secondary | ICD-10-CM | POA: Diagnosis not present

## 2018-04-01 DIAGNOSIS — Z8679 Personal history of other diseases of the circulatory system: Secondary | ICD-10-CM | POA: Diagnosis not present

## 2018-04-01 DIAGNOSIS — R278 Other lack of coordination: Secondary | ICD-10-CM | POA: Diagnosis not present

## 2018-04-16 DIAGNOSIS — B351 Tinea unguium: Secondary | ICD-10-CM | POA: Diagnosis not present

## 2018-04-16 DIAGNOSIS — E1142 Type 2 diabetes mellitus with diabetic polyneuropathy: Secondary | ICD-10-CM | POA: Diagnosis not present

## 2018-04-30 DIAGNOSIS — H26492 Other secondary cataract, left eye: Secondary | ICD-10-CM | POA: Diagnosis not present

## 2018-04-30 DIAGNOSIS — H5021 Vertical strabismus, right eye: Secondary | ICD-10-CM | POA: Diagnosis not present

## 2018-04-30 DIAGNOSIS — H35412 Lattice degeneration of retina, left eye: Secondary | ICD-10-CM | POA: Diagnosis not present

## 2018-04-30 DIAGNOSIS — E119 Type 2 diabetes mellitus without complications: Secondary | ICD-10-CM | POA: Diagnosis not present

## 2018-06-02 DIAGNOSIS — I5022 Chronic systolic (congestive) heart failure: Secondary | ICD-10-CM | POA: Diagnosis not present

## 2018-06-02 DIAGNOSIS — E1159 Type 2 diabetes mellitus with other circulatory complications: Secondary | ICD-10-CM | POA: Diagnosis not present

## 2018-06-02 DIAGNOSIS — E1169 Type 2 diabetes mellitus with other specified complication: Secondary | ICD-10-CM | POA: Diagnosis not present

## 2018-06-02 DIAGNOSIS — I10 Essential (primary) hypertension: Secondary | ICD-10-CM | POA: Diagnosis not present

## 2018-06-02 DIAGNOSIS — E785 Hyperlipidemia, unspecified: Secondary | ICD-10-CM | POA: Diagnosis not present

## 2018-06-02 DIAGNOSIS — I25810 Atherosclerosis of coronary artery bypass graft(s) without angina pectoris: Secondary | ICD-10-CM | POA: Diagnosis not present

## 2018-06-27 ENCOUNTER — Emergency Department: Payer: PPO

## 2018-06-27 ENCOUNTER — Encounter: Payer: Self-pay | Admitting: Emergency Medicine

## 2018-06-27 ENCOUNTER — Other Ambulatory Visit: Payer: Self-pay

## 2018-06-27 ENCOUNTER — Emergency Department
Admission: EM | Admit: 2018-06-27 | Discharge: 2018-06-27 | Disposition: A | Discharge status: Home / Self Care | Payer: PPO | Attending: Emergency Medicine | Admitting: Emergency Medicine

## 2018-06-27 DIAGNOSIS — Z8673 Personal history of transient ischemic attack (TIA), and cerebral infarction without residual deficits: Secondary | ICD-10-CM | POA: Diagnosis not present

## 2018-06-27 DIAGNOSIS — R0602 Shortness of breath: Secondary | ICD-10-CM | POA: Diagnosis not present

## 2018-06-27 DIAGNOSIS — I110 Hypertensive heart disease with heart failure: Secondary | ICD-10-CM | POA: Insufficient documentation

## 2018-06-27 DIAGNOSIS — Z7984 Long term (current) use of oral hypoglycemic drugs: Secondary | ICD-10-CM | POA: Diagnosis not present

## 2018-06-27 DIAGNOSIS — E119 Type 2 diabetes mellitus without complications: Secondary | ICD-10-CM | POA: Diagnosis not present

## 2018-06-27 DIAGNOSIS — F17210 Nicotine dependence, cigarettes, uncomplicated: Secondary | ICD-10-CM | POA: Diagnosis not present

## 2018-06-27 DIAGNOSIS — I5023 Acute on chronic systolic (congestive) heart failure: Secondary | ICD-10-CM | POA: Insufficient documentation

## 2018-06-27 DIAGNOSIS — I2510 Atherosclerotic heart disease of native coronary artery without angina pectoris: Secondary | ICD-10-CM | POA: Diagnosis not present

## 2018-06-27 DIAGNOSIS — Z7982 Long term (current) use of aspirin: Secondary | ICD-10-CM | POA: Diagnosis not present

## 2018-06-27 DIAGNOSIS — Z8546 Personal history of malignant neoplasm of prostate: Secondary | ICD-10-CM | POA: Insufficient documentation

## 2018-06-27 DIAGNOSIS — Z79899 Other long term (current) drug therapy: Secondary | ICD-10-CM | POA: Insufficient documentation

## 2018-06-27 DIAGNOSIS — I509 Heart failure, unspecified: Secondary | ICD-10-CM | POA: Diagnosis not present

## 2018-06-27 DIAGNOSIS — Z7902 Long term (current) use of antithrombotics/antiplatelets: Secondary | ICD-10-CM | POA: Diagnosis not present

## 2018-06-27 DIAGNOSIS — J449 Chronic obstructive pulmonary disease, unspecified: Secondary | ICD-10-CM | POA: Diagnosis not present

## 2018-06-27 LAB — CBC
HEMATOCRIT: 47.2 % (ref 40.0–52.0)
HEMOGLOBIN: 15.9 g/dL (ref 13.0–18.0)
MCH: 30.6 pg (ref 26.0–34.0)
MCHC: 33.6 g/dL (ref 32.0–36.0)
MCV: 91.2 fL (ref 80.0–100.0)
Platelets: 220 10*3/uL (ref 150–440)
RBC: 5.18 MIL/uL (ref 4.40–5.90)
RDW: 15.6 % — ABNORMAL HIGH (ref 11.5–14.5)
WBC: 9.1 10*3/uL (ref 3.8–10.6)

## 2018-06-27 LAB — BASIC METABOLIC PANEL
ANION GAP: 8 (ref 5–15)
BUN: 19 mg/dL (ref 8–23)
CO2: 26 mmol/L (ref 22–32)
Calcium: 9.1 mg/dL (ref 8.9–10.3)
Chloride: 106 mmol/L (ref 98–111)
Creatinine, Ser: 1.73 mg/dL — ABNORMAL HIGH (ref 0.61–1.24)
GFR calc Af Amer: 43 mL/min — ABNORMAL LOW (ref >60)
GFR, EST NON AFRICAN AMERICAN: 37 mL/min — AB (ref >60)
GLUCOSE: 125 mg/dL — AB (ref 70–99)
POTASSIUM: 4.6 mmol/L (ref 3.5–5.1)
SODIUM: 140 mmol/L (ref 135–145)

## 2018-06-27 LAB — BRAIN NATRIURETIC PEPTIDE: B NATRIURETIC PEPTIDE 5: 970 pg/mL — AB (ref 0.0–100.0)

## 2018-06-27 LAB — TROPONIN I
TROPONIN I: 0.03 ng/mL — AB (ref <0.03)
Troponin I: 0.03 ng/mL (ref <0.03)

## 2018-06-27 MED ORDER — FUROSEMIDE 40 MG PO TABS: 40.0000 mg | ORAL_TABLET | Freq: Two times a day (BID) | ORAL | 0 refills | Status: DC

## 2018-06-27 MED ORDER — FUROSEMIDE 10 MG/ML IJ SOLN
60.0000 mg | Freq: Once | INTRAMUSCULAR | Status: AC
  Administered 2018-06-27: 60 mg via INTRAVENOUS
  Filled 2018-06-27: qty 8

## 2018-06-27 NOTE — ED Notes (Signed)
Date and time results received: 06/27/18 0820   Test: troponin Critical Value: 0.03  Name of Provider Notified: Dr. Burlene Arnt

## 2018-06-27 NOTE — ED Notes (Signed)
Dr. McShane at bedside.  

## 2018-06-27 NOTE — ED Notes (Signed)
Pt ambulatory to toilet

## 2018-06-27 NOTE — Discharge Instructions (Addendum)
Take your Lasix as prescribed, if you have shortness of breath or chest pain or you feel worse return to the emergency department.

## 2018-06-27 NOTE — ED Provider Notes (Addendum)
[facility] Emergency Department Provider Note  ____________________________________________   I have reviewed the triage vital signs and the nursing notes. Where available I have reviewed prior notes and, if possible and indicated, outside hospital notes.    HISTORY  Chief Complaint Shortness of Breath    HPI [name] is a 72 y.o. male with a history of CHF with a 45% EF on last echo in 2018 at [facility], history of COPD as well, ran out of his Lasix 5 days ago and has had gradually increasing lower extremity edema bilaterally as well as increasing orthopnea and dyspnea, there is some issue with getting his prescription refilled, he has been trying to work with his pharmacy on this but he is worried that he is feeling with fluid, he does not keep track of his weights.  He denies any fever chills or productive cough, he has no chest pain, he just become slowly more short of breath since he stopped taking his Lasix.  Nothing makes it better aside from resting is worse when he lies flat or walks around, no other associated symptoms no other prior treatment, been going on for about as long she is been out of his Lasix. Options are mild at this time sitting still   Past Medical History:  Diagnosis Date  . CHF (congestive heart failure) ([place])   . COPD (chronic obstructive pulmonary disease) ([place])   . Coronary artery disease   . Diabetes mellitus without complication ([place])   . GERD (gastroesophageal reflux disease)   . HLD (hyperlipidemia)   . Hypertension   . Peripheral vascular disease ([place])   . Pneumonia   . Prostate cancer ([place])   . Stroke [facility])     Patient Active Problem List   Diagnosis Date Noted  . Malignant neoplasm of prostate ([place]) 11/24/2016  . Bright red rectal bleeding 10/02/2016  . COPD exacerbation ([place]) 01/17/2016  . Chronic systolic heart failure ([place]) 12/12/2015  . Essential hypertension 12/12/2015  . COPD (chronic obstructive  pulmonary disease) with chronic bronchitis ([place]) 12/12/2015  . Diabetes ([place]) 12/12/2015  . Tobacco abuse 12/12/2015    Past Surgical History:  Procedure Laterality Date  . COLONOSCOPY    . CORONARY ARTERY BYPASS GRAFT  2006  . EYE SURGERY    . GOLD SEED IMPLANT N/A 12/30/2016   Procedure: GOLD SEED IMPLANT  x3;  Surgeon: Hollice Espy, MD;  Location: [facility] ORS;  Service: Urology;  Laterality: N/A;  . HEMORRHOID SURGERY N/A 10/02/2016   Procedure: PROCTOSCOPY AND CONTROL OF RECTAL BLEEDING;  Surgeon: Excell Seltzer, MD;  Location: [facility] ORS;  Service: General;  Laterality: N/A;  . PROSTATE BIOPSY    . TONSILLECTOMY      Prior to Admission medications   Medication Sig Start Date End Date Taking? Authorizing Provider  acetaminophen (TYLENOL) 325 MG tablet Take 2 tablets (650 mg total) by mouth every 6 (six) hours as needed for mild pain (or Fever >/= 101). 01/20/16   Gouru, Illene Silver, MD  albuterol (PROVENTIL HFA;VENTOLIN HFA) 108 (90 Base) MCG/ACT inhaler Inhale 2 puffs into the lungs every 6 (six) hours as needed for wheezing or shortness of breath.    [provider]  aspirin EC 81 MG tablet Take 81 mg by mouth daily.    [provider]  cholecalciferol (VITAMIN D) 1000 units tablet Take 1,000 Units by mouth daily.    [provider]  clopidogrel (PLAVIX) 75 MG tablet Take 75 mg by mouth daily.  08/26/16  [provider]  Fluticasone-Salmeterol (ADVAIR DISKUS) 250-50 MCG/DOSE AEPB Inhale 1 puff into the lungs 2 (two) times daily. 12/07/15   Dustin Flock, MD  furosemide (LASIX) 40 MG tablet Take 40 mg by mouth 2 (two) times daily.    [provider]  isosorbide mononitrate (IMDUR) 30 MG 24 hr tablet Take 30 mg by mouth daily.    [provider]  LANTUS SOLOSTAR 100 UNIT/ML Solostar Pen Inject 50 Units into the skin daily at 10 pm.  10/07/16   [provider]  lisinopril (PRINIVIL,ZESTRIL) 40 MG tablet Take 40 mg by mouth daily.     [provider]  Magnesium 250 MG TABS Take 250 mg by mouth daily.    [provider]  metFORMIN (GLUCOPHAGE) 1000 MG tablet Take 1,000 mg by mouth 2 (two) times daily with a meal.    [provider]  metoprolol (LOPRESSOR) 50 MG tablet Take 50 mg by mouth 2 (two) times daily.    [provider]  Multiple Vitamin (MULTIVITAMIN WITH MINERALS) TABS tablet Take 1 tablet by mouth daily.    [provider]  multivitamin-lutein (OCUVITE-LUTEIN) CAPS capsule Take 1 capsule by mouth daily.    [provider]  omeprazole (PRILOSEC) 20 MG capsule Take 20 mg by mouth daily.     [provider]  phenazopyridine (PYRIDIUM) 200 MG tablet Take 1 tablet (200 mg total) by mouth 3 (three) times daily as needed for pain. Patient not taking: Reported on 08/26/2017 03/03/17   Noreene Filbert, MD  potassium chloride (K-DUR) 10 MEQ tablet Take 10 mEq by mouth daily.    [provider]  sacubitril-valsartan (ENTRESTO) 24-26 MG Take 1 tablet by mouth 2 (two) times daily. Patient not taking: Reported on 12/16/2016 01/09/16   Alisa Graff, FNP  simvastatin (ZOCOR) 20 MG tablet Take 20 mg by mouth at bedtime.    [provider]  tamsulosin (FLOMAX) 0.4 MG CAPS capsule Take 1 capsule (0.4 mg total) by mouth at bedtime. 10/29/17   Hollice Espy, MD  vitamin C (ASCORBIC ACID) 500 MG tablet Take 500 mg by mouth daily.    [provider]    Allergies Patient has no known allergies.  Family History  Problem Relation Age of Onset  . Lung cancer Mother   . Heart attack Father   . Hypertension Other   . Cancer Maternal Grandfather        prostate  . Cancer Paternal Grandmother        breast    Social History Social History   Tobacco Use  . Smoking status: Current Every Day Smoker    Packs/day: 1.00    Years: 54.00    Pack years: 54.00    Types: Cigarettes  . Smokeless tobacco: Never Used  Substance Use Topics  . Alcohol  use: No    Alcohol/week: 0.0 standard drinks  . Drug use: No    Review of Systems Constitutional: No fever/chills Eyes: No visual changes. ENT: No sore throat. No stiff neck no neck pain Cardiovascular: Denies chest pain. Respiratory: + shortness of breath. Gastrointestinal:   no vomiting.  No diarrhea.  No constipation. Genitourinary: Negative for dysuria. Musculoskeletal: Negative lower extremity swelling Skin: Negative for rash. Neurological: Negative for severe headaches, focal weakness or numbness.   ____________________________________________   PHYSICAL EXAM:  VITAL SIGNS: ED Triage Vitals  Enc Vitals Group     BP 06/27/18 0718 (!) 178/52     Pulse Rate 06/27/18 0718 63  Resp 06/27/18 0718 20     Temp 06/27/18 0718 98 F (36.7 C)     Temp Source 06/27/18 0718 Oral     SpO2 06/27/18 0718 97 %     Weight 06/27/18 0719 178 lb (80.7 kg)     Height 06/27/18 0719 5\' 6"  (1.676 m)     Head Circumference --      Peak Flow --      Pain Score 06/27/18 0725 0     Pain Loc --      Pain Edu? --      Excl. in [place]? --     Constitutional: Alert and oriented. Well appearing and in no acute distress. Eyes: Conjunctivae are normal Head: Atraumatic HEENT: No congestion/rhinnorhea. Mucous membranes are moist.  Oropharynx non-erythematous Neck:   Nontender with no meningismus, no masses, no stridor Cardiovascular: Normal rate, regular rhythm. Grossly normal heart sounds.  Good peripheral circulation. Respiratory: Normal respiratory effort.  No retractions.  occasional slight rale appreciated in the bases.  Patient sitting straight up Abdominal: Soft and nontender. No distention. No guarding no rebound Back:  There is no focal tenderness or step off.  there is no midline tenderness there are no lesions noted. there is no CVA tenderness Musculoskeletal: No lower extremity tenderness, no upper extremity tenderness. No joint effusions, no DVT signs strong distal pulses symmetric  bilateral pitting 1-2+ edema Neurologic:  Normal speech and language. No gross focal neurologic deficits are appreciated.  Skin:  Skin is warm, dry and intact. No rash noted. Psychiatric: Mood and affect are normal. Speech and behavior are normal.  ____________________________________________   LABS (all labs ordered are listed, but only abnormal results are displayed)  Labs Reviewed  BASIC METABOLIC PANEL  CBC  TROPONIN I  BRAIN NATRIURETIC PEPTIDE    Pertinent labs  results that were available during my care of the patient were reviewed by me and considered in my medical decision making (see chart for details). ____________________________________________  EKG  I personally interpreted any EKGs ordered by me or triage Heart rate 66 bpm, appears to be sinus rhythm with a long PR, there are flipped T waves laterally consistent with a strain pattern which are seen on old EKG, normal axis, no significant change noted from prior including long PR. ____________________________________________  RADIOLOGY  Pertinent labs & imaging results that were available during my care of the patient were reviewed by me and considered in my medical decision making (see chart for details). If possible, patient and/or family made aware of any abnormal findings.  Dg Chest 2 View  Result Date: 06/27/2018 CLINICAL DATA:  Shortness of breath. EXAM: CHEST - 2 VIEW COMPARISON:  Radiographs of November 17, 2016. FINDINGS: The heart size and mediastinal contours are within normal limits. Status post coronary artery bypass graft. Atherosclerosis of thoracic aorta is noted. No pneumothorax is noted. Stable minimal left pleural effusion or pleural thickening is noted with associated scarring. Increased right basilar interstitial densities are noted with [place] B-lines suggesting pulmonary edema. The visualized skeletal structures are unremarkable. IMPRESSION: Stable left basilar scarring and minimal pleural effusion  is noted. Increased right basilar interstitial densities are noted concerning for pulmonary edema. Electronically Signed   By: Marijo Conception, M.D.   On: 06/27/2018 07:50   ____________________________________________    PROCEDURES  Procedure(s) performed: None  Procedures  Critical Care performed: None  ____________________________________________   INITIAL IMPRESSION / ASSESSMENT AND PLAN / ED COURSE  Pertinent labs & imaging results that were  available during my care of the patient were reviewed by me and considered in my medical decision making (see chart for details).  Patient has no chest pain, he is feeling short of breath and appears to be suffering from mild to moderate CHF in the context of inability to acquire and benefit therapeutically from his Lasix.  We will give him Lasix here, check BMP chest x-ray, troponin and reassess.  Nothing at this time suggest PE, dissection, or pneumonia.  Sats are 98% on room air, so I have that we can diurese him and get him home possible.  EKG is abnormal but does not show any change from prior we will refer him back to his cardiologist for that.  He has not had any chest pain and his symptoms all began and gradually worsened as he became more separated temporally from his last Lasix dose.     ----------------------------------------- 9:44 AM on 06/27/2018 -----------------------------------------  She is already diuresed at least 400 cc feels much better, oxygen saturation still reassuring, patient feels effectively improved and clinically does appear better with lungs already clear to auscultation, do not think he out of medically should be admitted to the hospital.  Baseline troponin is .04.05.06 range with his chronic renal insufficiency, he has had no chest pain troponin is 0.03 today.  He has a baseline abnormal EKG I will check a second troponin if negative, his strong preference is to go home, we will send him home with his Lasix.   ______   FINAL CLINICAL IMPRESSION(S) / ED DIAGNOSES  Final diagnoses:  None      This chart was dictated using voice recognition software.  Despite best efforts to proofread,  errors can occur which can change meaning.      Schuyler Amor, MD 06/27/18 [id number]    Schuyler Amor, MD 06/27/18 [phone]

## 2018-06-27 NOTE — ED Notes (Signed)
First RN Note: Pt presents to ED via POV with c/o SOB. Pt noted to have mild difficulty speaking in complete sentences at this time. A&O x 4, skin warm, dry, and intact.

## 2018-06-27 NOTE — ED Notes (Signed)
Pt taken to xray 

## 2018-06-27 NOTE — ED Notes (Signed)
Pt ambulatory to toilet with cane to have BM.

## 2018-06-27 NOTE — ED Triage Notes (Signed)
Pt presents with sob x 3-4 days; increasing in intensity today. States he needed furosemide refill and has been unable to have it refilled. Pt has CHF and has not had furosemide in a week. Pt alert & oriented with some coughing present at triage. NAD noted.

## 2018-07-16 DIAGNOSIS — I5023 Acute on chronic systolic (congestive) heart failure: Secondary | ICD-10-CM | POA: Diagnosis not present

## 2018-07-16 DIAGNOSIS — J449 Chronic obstructive pulmonary disease, unspecified: Secondary | ICD-10-CM | POA: Diagnosis not present

## 2018-08-10 DIAGNOSIS — L570 Actinic keratosis: Secondary | ICD-10-CM | POA: Diagnosis not present

## 2018-08-10 DIAGNOSIS — C44729 Squamous cell carcinoma of skin of left lower limb, including hip: Secondary | ICD-10-CM | POA: Diagnosis not present

## 2018-08-10 DIAGNOSIS — L820 Inflamed seborrheic keratosis: Secondary | ICD-10-CM | POA: Diagnosis not present

## 2018-08-10 DIAGNOSIS — L578 Other skin changes due to chronic exposure to nonionizing radiation: Secondary | ICD-10-CM | POA: Diagnosis not present

## 2018-08-10 DIAGNOSIS — C44629 Squamous cell carcinoma of skin of left upper limb, including shoulder: Secondary | ICD-10-CM | POA: Diagnosis not present

## 2018-08-10 DIAGNOSIS — L565 Disseminated superficial actinic porokeratosis (DSAP): Secondary | ICD-10-CM | POA: Diagnosis not present

## 2018-08-10 DIAGNOSIS — D225 Melanocytic nevi of trunk: Secondary | ICD-10-CM | POA: Diagnosis not present

## 2018-08-10 DIAGNOSIS — L821 Other seborrheic keratosis: Secondary | ICD-10-CM | POA: Diagnosis not present

## 2018-08-10 DIAGNOSIS — D485 Neoplasm of uncertain behavior of skin: Secondary | ICD-10-CM | POA: Diagnosis not present

## 2018-08-10 DIAGNOSIS — C4492 Squamous cell carcinoma of skin, unspecified: Secondary | ICD-10-CM

## 2018-08-10 HISTORY — DX: Squamous cell carcinoma of skin, unspecified: C44.92

## 2018-08-16 DIAGNOSIS — E1142 Type 2 diabetes mellitus with diabetic polyneuropathy: Secondary | ICD-10-CM | POA: Diagnosis not present

## 2018-08-16 DIAGNOSIS — B351 Tinea unguium: Secondary | ICD-10-CM | POA: Diagnosis not present

## 2018-08-27 DIAGNOSIS — E119 Type 2 diabetes mellitus without complications: Secondary | ICD-10-CM | POA: Diagnosis not present

## 2018-08-27 DIAGNOSIS — N183 Chronic kidney disease, stage 3 (moderate): Secondary | ICD-10-CM | POA: Diagnosis not present

## 2018-08-27 DIAGNOSIS — E7800 Pure hypercholesterolemia, unspecified: Secondary | ICD-10-CM | POA: Diagnosis not present

## 2018-08-27 DIAGNOSIS — Z79899 Other long term (current) drug therapy: Secondary | ICD-10-CM | POA: Diagnosis not present

## 2018-09-06 DIAGNOSIS — E1142 Type 2 diabetes mellitus with diabetic polyneuropathy: Secondary | ICD-10-CM | POA: Diagnosis not present

## 2018-09-06 DIAGNOSIS — J449 Chronic obstructive pulmonary disease, unspecified: Secondary | ICD-10-CM | POA: Diagnosis not present

## 2018-09-06 DIAGNOSIS — Z79899 Other long term (current) drug therapy: Secondary | ICD-10-CM | POA: Diagnosis not present

## 2018-09-06 DIAGNOSIS — I10 Essential (primary) hypertension: Secondary | ICD-10-CM | POA: Diagnosis not present

## 2018-09-06 DIAGNOSIS — Z794 Long term (current) use of insulin: Secondary | ICD-10-CM | POA: Diagnosis not present

## 2018-09-06 DIAGNOSIS — I5022 Chronic systolic (congestive) heart failure: Secondary | ICD-10-CM | POA: Diagnosis not present

## 2018-09-06 DIAGNOSIS — N183 Chronic kidney disease, stage 3 (moderate): Secondary | ICD-10-CM | POA: Diagnosis not present

## 2018-09-06 DIAGNOSIS — K219 Gastro-esophageal reflux disease without esophagitis: Secondary | ICD-10-CM | POA: Diagnosis not present

## 2018-09-06 DIAGNOSIS — E7800 Pure hypercholesterolemia, unspecified: Secondary | ICD-10-CM | POA: Diagnosis not present

## 2018-09-06 DIAGNOSIS — Z125 Encounter for screening for malignant neoplasm of prostate: Secondary | ICD-10-CM | POA: Diagnosis not present

## 2018-09-07 DIAGNOSIS — L570 Actinic keratosis: Secondary | ICD-10-CM | POA: Diagnosis not present

## 2018-09-07 DIAGNOSIS — L578 Other skin changes due to chronic exposure to nonionizing radiation: Secondary | ICD-10-CM | POA: Diagnosis not present

## 2018-09-07 DIAGNOSIS — C44622 Squamous cell carcinoma of skin of right upper limb, including shoulder: Secondary | ICD-10-CM | POA: Diagnosis not present

## 2018-09-07 DIAGNOSIS — Z85828 Personal history of other malignant neoplasm of skin: Secondary | ICD-10-CM | POA: Diagnosis not present

## 2018-09-23 DIAGNOSIS — E119 Type 2 diabetes mellitus without complications: Secondary | ICD-10-CM | POA: Diagnosis not present

## 2018-09-23 DIAGNOSIS — R309 Painful micturition, unspecified: Secondary | ICD-10-CM | POA: Diagnosis not present

## 2018-09-23 DIAGNOSIS — R262 Difficulty in walking, not elsewhere classified: Secondary | ICD-10-CM | POA: Diagnosis not present

## 2018-09-23 DIAGNOSIS — Z8679 Personal history of other diseases of the circulatory system: Secondary | ICD-10-CM | POA: Diagnosis not present

## 2018-09-23 DIAGNOSIS — Z720 Tobacco use: Secondary | ICD-10-CM | POA: Diagnosis not present

## 2018-09-23 DIAGNOSIS — M542 Cervicalgia: Secondary | ICD-10-CM | POA: Diagnosis not present

## 2018-09-23 DIAGNOSIS — H539 Unspecified visual disturbance: Secondary | ICD-10-CM | POA: Diagnosis not present

## 2018-09-23 DIAGNOSIS — R278 Other lack of coordination: Secondary | ICD-10-CM | POA: Diagnosis not present

## 2018-09-23 DIAGNOSIS — Z794 Long term (current) use of insulin: Secondary | ICD-10-CM | POA: Diagnosis not present

## 2018-09-28 DIAGNOSIS — Z7902 Long term (current) use of antithrombotics/antiplatelets: Secondary | ICD-10-CM | POA: Diagnosis not present

## 2018-09-28 DIAGNOSIS — Z8546 Personal history of malignant neoplasm of prostate: Secondary | ICD-10-CM | POA: Diagnosis not present

## 2018-09-28 DIAGNOSIS — I10 Essential (primary) hypertension: Secondary | ICD-10-CM | POA: Diagnosis not present

## 2018-09-28 DIAGNOSIS — F17210 Nicotine dependence, cigarettes, uncomplicated: Secondary | ICD-10-CM | POA: Diagnosis not present

## 2018-09-28 DIAGNOSIS — Z9181 History of falling: Secondary | ICD-10-CM | POA: Diagnosis not present

## 2018-09-28 DIAGNOSIS — R309 Painful micturition, unspecified: Secondary | ICD-10-CM | POA: Diagnosis not present

## 2018-09-28 DIAGNOSIS — Z86010 Personal history of colonic polyps: Secondary | ICD-10-CM | POA: Diagnosis not present

## 2018-09-28 DIAGNOSIS — G8929 Other chronic pain: Secondary | ICD-10-CM | POA: Diagnosis not present

## 2018-09-28 DIAGNOSIS — M545 Low back pain: Secondary | ICD-10-CM | POA: Diagnosis not present

## 2018-09-28 DIAGNOSIS — J449 Chronic obstructive pulmonary disease, unspecified: Secondary | ICD-10-CM | POA: Diagnosis not present

## 2018-09-28 DIAGNOSIS — Z951 Presence of aortocoronary bypass graft: Secondary | ICD-10-CM | POA: Diagnosis not present

## 2018-09-28 DIAGNOSIS — Z794 Long term (current) use of insulin: Secondary | ICD-10-CM | POA: Diagnosis not present

## 2018-09-28 DIAGNOSIS — N529 Male erectile dysfunction, unspecified: Secondary | ICD-10-CM | POA: Diagnosis not present

## 2018-09-28 DIAGNOSIS — I2510 Atherosclerotic heart disease of native coronary artery without angina pectoris: Secondary | ICD-10-CM | POA: Diagnosis not present

## 2018-09-28 DIAGNOSIS — I69393 Ataxia following cerebral infarction: Secondary | ICD-10-CM | POA: Diagnosis not present

## 2018-09-28 DIAGNOSIS — I351 Nonrheumatic aortic (valve) insufficiency: Secondary | ICD-10-CM | POA: Diagnosis not present

## 2018-09-28 DIAGNOSIS — H532 Diplopia: Secondary | ICD-10-CM | POA: Diagnosis not present

## 2018-09-28 DIAGNOSIS — E1151 Type 2 diabetes mellitus with diabetic peripheral angiopathy without gangrene: Secondary | ICD-10-CM | POA: Diagnosis not present

## 2018-09-28 DIAGNOSIS — R32 Unspecified urinary incontinence: Secondary | ICD-10-CM | POA: Diagnosis not present

## 2018-09-28 DIAGNOSIS — G43909 Migraine, unspecified, not intractable, without status migrainosus: Secondary | ICD-10-CM | POA: Diagnosis not present

## 2018-09-28 DIAGNOSIS — E785 Hyperlipidemia, unspecified: Secondary | ICD-10-CM | POA: Diagnosis not present

## 2018-09-28 DIAGNOSIS — Z7982 Long term (current) use of aspirin: Secondary | ICD-10-CM | POA: Diagnosis not present

## 2018-12-16 DIAGNOSIS — L570 Actinic keratosis: Secondary | ICD-10-CM | POA: Diagnosis not present

## 2019-01-05 DIAGNOSIS — I10 Essential (primary) hypertension: Secondary | ICD-10-CM | POA: Diagnosis not present

## 2019-01-05 DIAGNOSIS — I25810 Atherosclerosis of coronary artery bypass graft(s) without angina pectoris: Secondary | ICD-10-CM | POA: Diagnosis not present

## 2019-01-05 DIAGNOSIS — E1159 Type 2 diabetes mellitus with other circulatory complications: Secondary | ICD-10-CM | POA: Diagnosis not present

## 2019-01-05 DIAGNOSIS — I5022 Chronic systolic (congestive) heart failure: Secondary | ICD-10-CM | POA: Diagnosis not present

## 2019-01-10 DIAGNOSIS — D692 Other nonthrombocytopenic purpura: Secondary | ICD-10-CM | POA: Diagnosis not present

## 2019-01-10 DIAGNOSIS — L578 Other skin changes due to chronic exposure to nonionizing radiation: Secondary | ICD-10-CM | POA: Diagnosis not present

## 2019-01-10 DIAGNOSIS — B078 Other viral warts: Secondary | ICD-10-CM | POA: Diagnosis not present

## 2019-01-10 DIAGNOSIS — C44622 Squamous cell carcinoma of skin of right upper limb, including shoulder: Secondary | ICD-10-CM | POA: Diagnosis not present

## 2019-01-10 DIAGNOSIS — Z85828 Personal history of other malignant neoplasm of skin: Secondary | ICD-10-CM | POA: Diagnosis not present

## 2019-01-10 DIAGNOSIS — D485 Neoplasm of uncertain behavior of skin: Secondary | ICD-10-CM | POA: Diagnosis not present

## 2019-01-10 DIAGNOSIS — L570 Actinic keratosis: Secondary | ICD-10-CM | POA: Diagnosis not present

## 2019-02-15 DIAGNOSIS — L570 Actinic keratosis: Secondary | ICD-10-CM | POA: Diagnosis not present

## 2019-02-16 DIAGNOSIS — I5022 Chronic systolic (congestive) heart failure: Secondary | ICD-10-CM | POA: Diagnosis not present

## 2019-02-21 DIAGNOSIS — I10 Essential (primary) hypertension: Secondary | ICD-10-CM | POA: Diagnosis not present

## 2019-02-21 DIAGNOSIS — E1159 Type 2 diabetes mellitus with other circulatory complications: Secondary | ICD-10-CM | POA: Diagnosis not present

## 2019-02-21 DIAGNOSIS — I350 Nonrheumatic aortic (valve) stenosis: Secondary | ICD-10-CM | POA: Diagnosis not present

## 2019-02-21 DIAGNOSIS — I5022 Chronic systolic (congestive) heart failure: Secondary | ICD-10-CM | POA: Diagnosis not present

## 2019-02-21 DIAGNOSIS — I25810 Atherosclerosis of coronary artery bypass graft(s) without angina pectoris: Secondary | ICD-10-CM | POA: Diagnosis not present

## 2019-02-24 ENCOUNTER — Other Ambulatory Visit: Payer: Self-pay

## 2019-02-25 ENCOUNTER — Inpatient Hospital Stay: Payer: PPO | Attending: Radiation Oncology

## 2019-02-25 ENCOUNTER — Other Ambulatory Visit: Payer: Self-pay

## 2019-02-25 DIAGNOSIS — C61 Malignant neoplasm of prostate: Secondary | ICD-10-CM | POA: Insufficient documentation

## 2019-02-25 LAB — PSA: Prostatic Specific Antigen: 0.49 ng/mL (ref 0.00–4.00)

## 2019-03-06 ENCOUNTER — Other Ambulatory Visit: Payer: Self-pay

## 2019-03-07 ENCOUNTER — Other Ambulatory Visit: Payer: Self-pay | Admitting: *Deleted

## 2019-03-07 ENCOUNTER — Other Ambulatory Visit: Payer: Self-pay

## 2019-03-07 ENCOUNTER — Encounter: Payer: Self-pay | Admitting: Radiation Oncology

## 2019-03-07 ENCOUNTER — Ambulatory Visit
Admission: RE | Admit: 2019-03-07 | Discharge: 2019-03-07 | Disposition: A | Discharge status: Home / Self Care | Payer: PPO | Source: Ambulatory Visit | Attending: Radiation Oncology | Admitting: Radiation Oncology

## 2019-03-07 VITALS — BP 172/62 | HR 61 | Temp 96.3°F | Resp 18 | Wt 179.9 lb

## 2019-03-07 DIAGNOSIS — Z923 Personal history of irradiation: Secondary | ICD-10-CM | POA: Insufficient documentation

## 2019-03-07 DIAGNOSIS — C61 Malignant neoplasm of prostate: Secondary | ICD-10-CM | POA: Insufficient documentation

## 2019-03-07 NOTE — Progress Notes (Signed)
Radiation Oncology Follow up Note  Name: [name]   Date:   03/07/2019 MRN:  [id number] DOB: 1946/08/24    This 73 y.o. male presents to the clinic today for 2-year follow-up status post IMRT radiation therapy for Gleason 7 adenocarcinoma the prostate.  REFERRING PROVIDER: [name], MD  HPI: Patient is a 73 year old male now about 2 years having completed IMRT radiation therapy for a Gleason 7 adenocarcinoma the prostate presenting with a PSA of 6.5.  He is seen today in routine follow-up he is doing well specifically denies any increased lower urinary tract symptoms diarrhea or fatigue.Marland Kitchen  His recent PSA is 0.5 down from 0.81-year prior.  COMPLICATIONS OF TREATMENT: none  FOLLOW UP COMPLIANCE: keeps appointments   PHYSICAL EXAM:  BP (!) 172/62 (BP Location: Left Arm, Patient Position: Sitting)   Pulse 61   Temp (!) 96.3 F (35.7 C)   Resp 18   Wt 179 lb 14.3 oz (81.6 kg)   BMI 29.04 kg/m  Well-developed well-nourished patient in NAD. HEENT reveals PERLA, EOMI, discs not visualized.  Oral cavity is clear. No oral mucosal lesions are identified. Neck is clear without evidence of cervical or supraclavicular adenopathy. Lungs are clear to A&P. Cardiac examination is essentially unremarkable with regular rate and rhythm without murmur rub or thrill. Abdomen is benign with no organomegaly or masses noted. Motor sensory and DTR levels are equal and symmetric in the upper and lower extremities. Cranial nerves II through XII are grossly intact. Proprioception is intact. No peripheral adenopathy or edema is identified. No motor or sensory levels are noted. Crude visual fields are within normal range.  RADIOLOGY RESULTS: No current films for review  PLAN: Present time patient is under excellent biochemical control of his prostate cancer.  I am pleased with his overall progress.  He has a very low side effect profile.  I have asked to see him back in 1 year with a repeat PSA at that time.   Patient knows to call with any concerns.  I would like to take this opportunity to thank you for allowing me to participate in the care of your patient.Noreene Filbert, MD

## 2019-03-08 DIAGNOSIS — Z794 Long term (current) use of insulin: Secondary | ICD-10-CM | POA: Diagnosis not present

## 2019-03-08 DIAGNOSIS — Z0000 Encounter for general adult medical examination without abnormal findings: Secondary | ICD-10-CM | POA: Diagnosis not present

## 2019-03-08 DIAGNOSIS — E7800 Pure hypercholesterolemia, unspecified: Secondary | ICD-10-CM | POA: Diagnosis not present

## 2019-03-08 DIAGNOSIS — Z79899 Other long term (current) drug therapy: Secondary | ICD-10-CM | POA: Diagnosis not present

## 2019-03-08 DIAGNOSIS — E1142 Type 2 diabetes mellitus with diabetic polyneuropathy: Secondary | ICD-10-CM | POA: Diagnosis not present

## 2019-03-15 ENCOUNTER — Other Ambulatory Visit: Payer: Self-pay | Admitting: Urology

## 2019-03-24 DIAGNOSIS — R278 Other lack of coordination: Secondary | ICD-10-CM | POA: Diagnosis not present

## 2019-04-12 DIAGNOSIS — C44219 Basal cell carcinoma of skin of left ear and external auricular canal: Secondary | ICD-10-CM | POA: Diagnosis not present

## 2019-04-12 DIAGNOSIS — B078 Other viral warts: Secondary | ICD-10-CM | POA: Diagnosis not present

## 2019-04-12 DIAGNOSIS — Z85828 Personal history of other malignant neoplasm of skin: Secondary | ICD-10-CM | POA: Diagnosis not present

## 2019-04-12 DIAGNOSIS — L578 Other skin changes due to chronic exposure to nonionizing radiation: Secondary | ICD-10-CM | POA: Diagnosis not present

## 2019-04-12 DIAGNOSIS — C4491 Basal cell carcinoma of skin, unspecified: Secondary | ICD-10-CM

## 2019-04-12 DIAGNOSIS — D692 Other nonthrombocytopenic purpura: Secondary | ICD-10-CM | POA: Diagnosis not present

## 2019-04-12 HISTORY — DX: Basal cell carcinoma of skin, unspecified: C44.91

## 2019-05-03 DIAGNOSIS — H5021 Vertical strabismus, right eye: Secondary | ICD-10-CM | POA: Diagnosis not present

## 2019-05-03 DIAGNOSIS — H26492 Other secondary cataract, left eye: Secondary | ICD-10-CM | POA: Diagnosis not present

## 2019-05-03 DIAGNOSIS — E119 Type 2 diabetes mellitus without complications: Secondary | ICD-10-CM | POA: Diagnosis not present

## 2019-05-03 DIAGNOSIS — H35412 Lattice degeneration of retina, left eye: Secondary | ICD-10-CM | POA: Diagnosis not present

## 2019-05-03 DIAGNOSIS — H43811 Vitreous degeneration, right eye: Secondary | ICD-10-CM | POA: Diagnosis not present

## 2019-05-23 DIAGNOSIS — Z85828 Personal history of other malignant neoplasm of skin: Secondary | ICD-10-CM | POA: Diagnosis not present

## 2019-05-23 DIAGNOSIS — D485 Neoplasm of uncertain behavior of skin: Secondary | ICD-10-CM | POA: Diagnosis not present

## 2019-05-23 DIAGNOSIS — L821 Other seborrheic keratosis: Secondary | ICD-10-CM | POA: Diagnosis not present

## 2019-05-23 DIAGNOSIS — L820 Inflamed seborrheic keratosis: Secondary | ICD-10-CM | POA: Diagnosis not present

## 2019-05-23 DIAGNOSIS — D692 Other nonthrombocytopenic purpura: Secondary | ICD-10-CM | POA: Diagnosis not present

## 2019-05-23 DIAGNOSIS — C44311 Basal cell carcinoma of skin of nose: Secondary | ICD-10-CM | POA: Diagnosis not present

## 2019-05-23 DIAGNOSIS — L570 Actinic keratosis: Secondary | ICD-10-CM | POA: Diagnosis not present

## 2019-05-30 DIAGNOSIS — C44319 Basal cell carcinoma of skin of other parts of face: Secondary | ICD-10-CM | POA: Diagnosis not present

## 2019-05-30 DIAGNOSIS — L578 Other skin changes due to chronic exposure to nonionizing radiation: Secondary | ICD-10-CM | POA: Diagnosis not present

## 2019-07-05 ENCOUNTER — Other Ambulatory Visit: Payer: Self-pay | Admitting: Urology

## 2019-07-24 ENCOUNTER — Other Ambulatory Visit: Payer: Self-pay | Admitting: Urology

## 2019-08-17 DIAGNOSIS — I5022 Chronic systolic (congestive) heart failure: Secondary | ICD-10-CM | POA: Diagnosis not present

## 2019-08-17 DIAGNOSIS — E1159 Type 2 diabetes mellitus with other circulatory complications: Secondary | ICD-10-CM | POA: Diagnosis not present

## 2019-08-17 DIAGNOSIS — J438 Other emphysema: Secondary | ICD-10-CM | POA: Diagnosis not present

## 2019-08-17 DIAGNOSIS — I350 Nonrheumatic aortic (valve) stenosis: Secondary | ICD-10-CM | POA: Diagnosis not present

## 2019-08-17 DIAGNOSIS — I10 Essential (primary) hypertension: Secondary | ICD-10-CM | POA: Diagnosis not present

## 2019-08-17 DIAGNOSIS — I25810 Atherosclerosis of coronary artery bypass graft(s) without angina pectoris: Secondary | ICD-10-CM | POA: Diagnosis not present

## 2019-09-01 DIAGNOSIS — E1142 Type 2 diabetes mellitus with diabetic polyneuropathy: Secondary | ICD-10-CM | POA: Diagnosis not present

## 2019-09-01 DIAGNOSIS — E7800 Pure hypercholesterolemia, unspecified: Secondary | ICD-10-CM | POA: Diagnosis not present

## 2019-09-01 DIAGNOSIS — Z79899 Other long term (current) drug therapy: Secondary | ICD-10-CM | POA: Diagnosis not present

## 2019-09-08 DIAGNOSIS — C61 Malignant neoplasm of prostate: Secondary | ICD-10-CM | POA: Diagnosis not present

## 2019-09-08 DIAGNOSIS — E1169 Type 2 diabetes mellitus with other specified complication: Secondary | ICD-10-CM | POA: Diagnosis not present

## 2019-09-08 DIAGNOSIS — Z794 Long term (current) use of insulin: Secondary | ICD-10-CM | POA: Diagnosis not present

## 2019-09-08 DIAGNOSIS — E1142 Type 2 diabetes mellitus with diabetic polyneuropathy: Secondary | ICD-10-CM | POA: Diagnosis not present

## 2019-09-08 DIAGNOSIS — N183 Chronic kidney disease, stage 3 unspecified: Secondary | ICD-10-CM | POA: Diagnosis not present

## 2019-09-08 DIAGNOSIS — J449 Chronic obstructive pulmonary disease, unspecified: Secondary | ICD-10-CM | POA: Diagnosis not present

## 2019-09-08 DIAGNOSIS — Z23 Encounter for immunization: Secondary | ICD-10-CM | POA: Diagnosis not present

## 2019-09-08 DIAGNOSIS — I509 Heart failure, unspecified: Secondary | ICD-10-CM | POA: Diagnosis not present

## 2019-09-08 DIAGNOSIS — E785 Hyperlipidemia, unspecified: Secondary | ICD-10-CM | POA: Diagnosis not present

## 2019-09-22 DIAGNOSIS — R262 Difficulty in walking, not elsewhere classified: Secondary | ICD-10-CM | POA: Diagnosis not present

## 2019-09-22 DIAGNOSIS — Z720 Tobacco use: Secondary | ICD-10-CM | POA: Diagnosis not present

## 2019-09-22 DIAGNOSIS — R278 Other lack of coordination: Secondary | ICD-10-CM | POA: Diagnosis not present

## 2019-09-22 DIAGNOSIS — H539 Unspecified visual disturbance: Secondary | ICD-10-CM | POA: Diagnosis not present

## 2019-09-22 DIAGNOSIS — E119 Type 2 diabetes mellitus without complications: Secondary | ICD-10-CM | POA: Diagnosis not present

## 2019-09-22 DIAGNOSIS — Z794 Long term (current) use of insulin: Secondary | ICD-10-CM | POA: Diagnosis not present

## 2019-09-22 DIAGNOSIS — Z8673 Personal history of transient ischemic attack (TIA), and cerebral infarction without residual deficits: Secondary | ICD-10-CM | POA: Diagnosis not present

## 2019-09-22 DIAGNOSIS — Z8679 Personal history of other diseases of the circulatory system: Secondary | ICD-10-CM | POA: Diagnosis not present

## 2019-09-26 ENCOUNTER — Other Ambulatory Visit: Payer: Self-pay

## 2019-09-26 ENCOUNTER — Emergency Department: Payer: PPO

## 2019-09-26 ENCOUNTER — Emergency Department
Admission: EM | Admit: 2019-09-26 | Discharge: 2019-09-26 | Disposition: A | Discharge status: Home / Self Care | Payer: PPO | Attending: Emergency Medicine | Admitting: Emergency Medicine

## 2019-09-26 DIAGNOSIS — E871 Hypo-osmolality and hyponatremia: Secondary | ICD-10-CM | POA: Diagnosis not present

## 2019-09-26 DIAGNOSIS — J441 Chronic obstructive pulmonary disease with (acute) exacerbation: Secondary | ICD-10-CM

## 2019-09-26 DIAGNOSIS — Z7982 Long term (current) use of aspirin: Secondary | ICD-10-CM | POA: Diagnosis not present

## 2019-09-26 DIAGNOSIS — I492 Junctional premature depolarization: Secondary | ICD-10-CM | POA: Diagnosis not present

## 2019-09-26 DIAGNOSIS — Z8673 Personal history of transient ischemic attack (TIA), and cerebral infarction without residual deficits: Secondary | ICD-10-CM | POA: Diagnosis not present

## 2019-09-26 DIAGNOSIS — I498 Other specified cardiac arrhythmias: Secondary | ICD-10-CM

## 2019-09-26 DIAGNOSIS — I110 Hypertensive heart disease with heart failure: Secondary | ICD-10-CM | POA: Diagnosis not present

## 2019-09-26 DIAGNOSIS — I509 Heart failure, unspecified: Secondary | ICD-10-CM | POA: Diagnosis not present

## 2019-09-26 DIAGNOSIS — I2510 Atherosclerotic heart disease of native coronary artery without angina pectoris: Secondary | ICD-10-CM | POA: Insufficient documentation

## 2019-09-26 DIAGNOSIS — Z79899 Other long term (current) drug therapy: Secondary | ICD-10-CM | POA: Insufficient documentation

## 2019-09-26 DIAGNOSIS — Z8546 Personal history of malignant neoplasm of prostate: Secondary | ICD-10-CM | POA: Diagnosis not present

## 2019-09-26 DIAGNOSIS — Z7984 Long term (current) use of oral hypoglycemic drugs: Secondary | ICD-10-CM | POA: Insufficient documentation

## 2019-09-26 DIAGNOSIS — R0602 Shortness of breath: Secondary | ICD-10-CM | POA: Diagnosis not present

## 2019-09-26 DIAGNOSIS — F17210 Nicotine dependence, cigarettes, uncomplicated: Secondary | ICD-10-CM | POA: Diagnosis not present

## 2019-09-26 DIAGNOSIS — Z951 Presence of aortocoronary bypass graft: Secondary | ICD-10-CM | POA: Diagnosis not present

## 2019-09-26 LAB — CBC WITH DIFFERENTIAL/PLATELET
Abs Immature Granulocytes: 0.09 10*3/uL — ABNORMAL HIGH (ref 0.00–0.07)
Basophils Absolute: 0.1 10*3/uL (ref 0.0–0.1)
Basophils Relative: 1 %
Eosinophils Absolute: 0.1 10*3/uL (ref 0.0–0.5)
Eosinophils Relative: 1 %
HCT: 48.5 % (ref 39.0–52.0)
Hemoglobin: 16.6 g/dL (ref 13.0–17.0)
Immature Granulocytes: 1 %
Lymphocytes Relative: 9 %
Lymphs Abs: 0.9 10*3/uL (ref 0.7–4.0)
MCH: 28.8 pg (ref 26.0–34.0)
MCHC: 34.2 g/dL (ref 30.0–36.0)
MCV: 84.2 fL (ref 80.0–100.0)
Monocytes Absolute: 1.1 10*3/uL — ABNORMAL HIGH (ref 0.1–1.0)
Monocytes Relative: 12 %
Neutro Abs: 7.5 10*3/uL (ref 1.7–7.7)
Neutrophils Relative %: 76 %
Platelets: 194 10*3/uL (ref 150–400)
RBC: 5.76 MIL/uL (ref 4.22–5.81)
RDW: 15.9 % — ABNORMAL HIGH (ref 11.5–15.5)
WBC: 9.7 10*3/uL (ref 4.0–10.5)
nRBC: 0 % (ref 0.0–0.2)

## 2019-09-26 LAB — COMPREHENSIVE METABOLIC PANEL
ALT: 107 U/L — ABNORMAL HIGH (ref 0–44)
AST: 57 U/L — ABNORMAL HIGH (ref 15–41)
Albumin: 4 g/dL (ref 3.5–5.0)
Alkaline Phosphatase: 121 U/L (ref 38–126)
Anion gap: 12 (ref 5–15)
BUN: 25 mg/dL — ABNORMAL HIGH (ref 8–23)
CO2: 27 mmol/L (ref 22–32)
Calcium: 9.4 mg/dL (ref 8.9–10.3)
Chloride: 92 mmol/L — ABNORMAL LOW (ref 98–111)
Creatinine, Ser: 2.02 mg/dL — ABNORMAL HIGH (ref 0.61–1.24)
GFR calc Af Amer: 37 mL/min — ABNORMAL LOW (ref >60)
GFR calc non Af Amer: 32 mL/min — ABNORMAL LOW (ref >60)
Glucose, Bld: 117 mg/dL — ABNORMAL HIGH (ref 70–99)
Potassium: 4.8 mmol/L (ref 3.5–5.1)
Sodium: 131 mmol/L — ABNORMAL LOW (ref 135–145)
Total Bilirubin: 1.6 mg/dL — ABNORMAL HIGH (ref 0.3–1.2)
Total Protein: 7.4 g/dL (ref 6.5–8.1)

## 2019-09-26 LAB — TROPONIN I (HIGH SENSITIVITY)
Troponin I (High Sensitivity): 78 ng/L — ABNORMAL HIGH (ref <18)
Troponin I (High Sensitivity): 70 ng/L — ABNORMAL HIGH (ref <18)

## 2019-09-26 MED ORDER — PREDNISONE 20 MG PO TABS: 60.0000 mg | ORAL_TABLET | Freq: Every day | ORAL | 0 refills | Status: AC

## 2019-09-26 MED ORDER — ALBUTEROL SULFATE HFA 108 (90 BASE) MCG/ACT IN AERS
4.0000 | INHALATION_SPRAY | Freq: Once | RESPIRATORY_TRACT | Status: AC
  Administered 2019-09-26: 4 via RESPIRATORY_TRACT
  Filled 2019-09-26: qty 6.7

## 2019-09-26 MED ORDER — PREDNISONE 20 MG PO TABS
60.0000 mg | ORAL_TABLET | Freq: Once | ORAL | Status: AC
  Administered 2019-09-26: 10:00:00 60 mg via ORAL
  Filled 2019-09-26: qty 3

## 2019-09-26 NOTE — ED Provider Notes (Signed)
[facility] Emergency Department Provider Note   ____________________________________________   First MD Initiated Contact with Patient 09/26/19 [phone]     (approximate)  I have reviewed the triage vital signs and the nursing notes.   HISTORY  Chief Complaint Shortness of Breath    HPI [name] is a 73 y.o. male with past medical history of CAD status post CABG, COPD, CHF (EF 40%), presents to the ED complaining of shortness of breath.  Patient reports that he has been feeling increasingly short of breath with exertion over the past 2 to 3 days along with an increased cough productive of clear sputum.  He denies any associated fevers or chest pain and states that his breathing seems normal when he is at rest.  He has not had any pain or swelling in his lower extremities.  He states he has been using his albuterol at home with only momentary relief.  He does admit to ongoing smoking, about three quarters of a pack per day.  He is not aware of any sick contacts.        Past Medical History:  Diagnosis Date  . CHF (congestive heart failure) ([place])   . COPD (chronic obstructive pulmonary disease) ([place])   . Coronary artery disease   . Diabetes mellitus without complication ([place])   . GERD (gastroesophageal reflux disease)   . HLD (hyperlipidemia)   . Hypertension   . Peripheral vascular disease ([place])   . Pneumonia   . Prostate cancer ([place])   . Stroke [facility])     Patient Active Problem List   Diagnosis Date Noted  . Malignant neoplasm of prostate ([place]) 11/24/2016  . Bright red rectal bleeding 10/02/2016  . COPD exacerbation ([place]) 01/17/2016  . Chronic systolic heart failure ([place]) 12/12/2015  . Essential hypertension 12/12/2015  . COPD (chronic obstructive pulmonary disease) with chronic bronchitis ([place]) 12/12/2015  . Diabetes ([place]) 12/12/2015  . Tobacco abuse 12/12/2015    Past Surgical History:  Procedure Laterality Date  . COLONOSCOPY    .  CORONARY ARTERY BYPASS GRAFT  2006  . EYE SURGERY    . GOLD SEED IMPLANT N/A 12/30/2016   Procedure: GOLD SEED IMPLANT  x3;  Surgeon: Hollice Espy, MD;  Location: [facility] ORS;  Service: Urology;  Laterality: N/A;  . HEMORRHOID SURGERY N/A 10/02/2016   Procedure: PROCTOSCOPY AND CONTROL OF RECTAL BLEEDING;  Surgeon: Excell Seltzer, MD;  Location: [facility] ORS;  Service: General;  Laterality: N/A;  . PROSTATE BIOPSY    . TONSILLECTOMY      Prior to Admission medications   Medication Sig Start Date End Date Taking? Authorizing Provider  Ipratropium-Albuterol (COMBIVENT) 20-100 MCG/ACT AERS respimat Inhale 2 puffs into the lungs 4 (four) times daily as needed. 09/08/19 09/07/20 Yes [provider]  acetaminophen (TYLENOL) 325 MG tablet Take 2 tablets (650 mg total) by mouth every 6 (six) hours as needed for mild pain (or Fever >/= 101). 01/20/16   Gouru, Illene Silver, MD  albuterol (PROVENTIL HFA;VENTOLIN HFA) 108 (90 Base) MCG/ACT inhaler Inhale 2 puffs into the lungs every 6 (six) hours as needed for wheezing or shortness of breath.    [provider]  aspirin EC 81 MG tablet Take 81 mg by mouth daily.    [provider]  cholecalciferol (VITAMIN D) 1000 units tablet Take 1,000 Units by mouth daily.    [provider]  clopidogrel (PLAVIX) 75 MG tablet Take 75 mg by mouth daily.  08/26/16   [provider]  Fluticasone-Salmeterol (ADVAIR DISKUS) 250-50 MCG/DOSE AEPB Inhale 1 puff into the lungs 2 (two) times daily. 12/07/15   Dustin Flock, MD  furosemide (LASIX) 40 MG tablet Take 1 tablet (40 mg total) by mouth 2 (two) times daily. 06/27/18   Schuyler Amor, MD  LANTUS SOLOSTAR 100 UNIT/ML Solostar Pen Inject 53 Units into the skin daily at 10 pm.  10/07/16   [provider]  lisinopril (PRINIVIL,ZESTRIL) 40 MG tablet Take 40 mg by mouth daily.    [provider]  Magnesium 250 MG TABS Take 250 mg by mouth daily.    [provider]   metFORMIN (GLUCOPHAGE) 1000 MG tablet Take 1,000 mg by mouth 2 (two) times daily with a meal.    [provider]  metoprolol (LOPRESSOR) 50 MG tablet Take 50 mg by mouth 2 (two) times daily.    [provider]  Multiple Vitamin (MULTIVITAMIN WITH MINERALS) TABS tablet Take 1 tablet by mouth daily.    [provider]  multivitamin-lutein (OCUVITE-LUTEIN) CAPS capsule Take 1 capsule by mouth daily.    [provider]  omeprazole (PRILOSEC) 20 MG capsule Take 20 mg by mouth daily.     [provider]  potassium chloride (K-DUR) 10 MEQ tablet Take 10 mEq by mouth daily.    [provider]  predniSONE (DELTASONE) 20 MG tablet Take 3 tablets (60 mg total) by mouth daily for 5 days. 09/26/19 10/01/19  Blake Divine, MD  sacubitril-valsartan (ENTRESTO) 24-26 MG Take 1 tablet by mouth 2 (two) times daily. Patient not taking: Reported on 12/16/2016 01/09/16   Alisa Graff, FNP  simvastatin (ZOCOR) 20 MG tablet Take 20 mg by mouth at bedtime.    [provider]  tamsulosin (FLOMAX) 0.4 MG CAPS capsule Take 1 capsule (0.4 mg total) by mouth at bedtime. 10/29/17   Hollice Espy, MD  vitamin C (ASCORBIC ACID) 500 MG tablet Take 500 mg by mouth daily.    [provider]    Allergies Patient has no known allergies.  Family History  Problem Relation Age of Onset  . Lung cancer Mother   . Heart attack Father   . Hypertension Other   . Cancer Maternal Grandfather        prostate  . Cancer Paternal Grandmother        breast    Social History Social History   Tobacco Use  . Smoking status: Current Every Day Smoker    Packs/day: 1.00    Years: 54.00    Pack years: 54.00    Types: Cigarettes  . Smokeless tobacco: Never Used  Substance Use Topics  . Alcohol use: No    Alcohol/week: 0.0 standard drinks  . Drug use: No    Review of Systems  Constitutional: No fever/chills Eyes: No visual changes. ENT: No sore throat.  Cardiovascular: Denies chest pain. Respiratory: Positive for cough and shortness of breath. Gastrointestinal: No abdominal pain.  No nausea, no vomiting.  No diarrhea.  No constipation. Genitourinary: Negative for dysuria. Musculoskeletal: Negative for back pain. Skin: Negative for rash. Neurological: Negative for headaches, focal weakness or numbness.  ____________________________________________   PHYSICAL EXAM:  VITAL SIGNS: ED Triage Vitals  Enc Vitals Group     BP 09/26/19 0411 135/73     Pulse Rate 09/26/19 0411 76     Resp 09/26/19 0411 20     Temp 09/26/19 0411 97.8 F (36.6 C)     Temp Source 09/26/19 0411 Oral  SpO2 09/26/19 0411 98 %     Weight 09/26/19 0425 175 lb (79.4 kg)     Height 09/26/19 0425 5\' 6"  (1.676 m)     Head Circumference --      Peak Flow --      Pain Score 09/26/19 0425 0     Pain Loc --      Pain Edu? --      Excl. in [place]? --     Constitutional: Alert and oriented. Eyes: Conjunctivae are normal. Head: Atraumatic. Nose: No congestion/rhinnorhea. Mouth/Throat: Mucous membranes are moist. Neck: Normal ROM Cardiovascular: Normal rate, regular rhythm. Grossly normal heart sounds. Respiratory: Normal respiratory effort.  No retractions.  Poor air movement and expiratory wheezing throughout. Gastrointestinal: Soft and nontender. No distention. Genitourinary: deferred Musculoskeletal: No lower extremity tenderness nor edema. Neurologic:  Normal speech and language. No gross focal neurologic deficits are appreciated. Skin:  Skin is warm, dry and intact. No rash noted. Psychiatric: Mood and affect are normal. Speech and behavior are normal.  ____________________________________________   LABS (all labs ordered are listed, but only abnormal results are displayed)  Labs Reviewed  CBC WITH DIFFERENTIAL/PLATELET - Abnormal; Notable for the following components:      Result Value   RDW 15.9 (*)    Monocytes Absolute 1.1 (*)    Abs Immature  Granulocytes 0.09 (*)    All other components within normal limits  COMPREHENSIVE METABOLIC PANEL - Abnormal; Notable for the following components:   Sodium 131 (*)    Chloride 92 (*)    Glucose, Bld 117 (*)    BUN 25 (*)    Creatinine, Ser 2.02 (*)    AST 57 (*)    ALT 107 (*)    Total Bilirubin 1.6 (*)    GFR calc non Af Amer 32 (*)    GFR calc Af Amer 37 (*)    All other components within normal limits  TROPONIN I (HIGH SENSITIVITY) - Abnormal; Notable for the following components:   Troponin I (High Sensitivity) 78 (*)    All other components within normal limits  TROPONIN I (HIGH SENSITIVITY) - Abnormal; Notable for the following components:   Troponin I (High Sensitivity) 70 (*)    All other components within normal limits   ____________________________________________  EKG  ED ECG REPORT I, Blake Divine, the attending physician, personally viewed and interpreted this ECG.   Date: 09/26/2019  EKG Time: 4:16  Rate: 71  Rhythm: Sinus arrhythmia  Axis: RAD  Intervals:Normal  ST&T Change: Inferolateral T wave inversions, similar to prior   PROCEDURES  Procedure(s) performed (including Critical Care):  Procedures   ____________________________________________   INITIAL IMPRESSION / ASSESSMENT AND PLAN / ED COURSE       73 year old male with with possible history of CHF, COPD, and CAD status post CABG presents to the ED with increasing shortness of breath and productive cough over the past 2 to 3 days.  He is in no respiratory distress and denies feeling short of breath at rest, is maintaining O2 sats on room air.  He does have some poor air movement and expiratory wheezing, will treat with albuterol and steroids.  Chest x-ray is negative for acute process.  EKG without acute ischemic changes, does show rhythm that appears consistent with sinus arrhythmia versus accelerated junctional rhythm with retrograde P waves.  Troponin is mildly elevated, however this is  likely related to his chronic kidney disease and is stable on repeat.  Do not suspect PE  as COPD is more likely.  Will reevaluate following breathing treatments and steroids.  Patient feeling better following albuterol and prednisone, no respiratory distress noted.  Rhythm appears most consistent with accelerated junctional rhythm with retrograde P waves, counseled patient to follow-up with cardiology for this however no acute intervention needed.  Will have patient continue steroids and follow-up closely with cardiology as well has his PCP.  Counseled to return to the ED for new or worsening symptoms, patient agrees with plan.      ____________________________________________   FINAL CLINICAL IMPRESSION(S) / ED DIAGNOSES  Final diagnoses:  COPD exacerbation ([place])  Accelerated junctional rhythm  Hyponatremia     ED Discharge Orders         Ordered    predniSONE (DELTASONE) 20 MG tablet  Daily     09/26/19 1134           Note:  This document was prepared using Dragon voice recognition software and may include unintentional dictation errors.   Blake Divine, MD 09/26/19 [phone]

## 2019-09-26 NOTE — ED Triage Notes (Signed)
Pt with shob worsening over last day. Pt with history of copd. Pt appears in no acute distress. resps unlabored. Pt denies fever, sore throat of pain.

## 2019-09-26 NOTE — ED Notes (Signed)
Pt sitting in wheelchair in no acute distress.

## 2019-09-26 NOTE — ED Notes (Signed)
Patient to lobby in wheelchair and NAD noted. Verbalized understanding of discharge instructions and follow-up care.

## 2019-10-11 ENCOUNTER — Other Ambulatory Visit: Payer: Self-pay

## 2019-10-11 ENCOUNTER — Encounter: Payer: Self-pay | Admitting: Emergency Medicine

## 2019-10-11 ENCOUNTER — Inpatient Hospital Stay (HOSPITAL_COMMUNITY)
Admission: EM | Admit: 2019-10-11 | Discharge: 2019-10-15 | DRG: 291 | Disposition: A | Discharge status: Skilled Nursing Facility | Payer: PPO | Source: Other Acute Inpatient Hospital | Attending: Internal Medicine | Admitting: Internal Medicine

## 2019-10-11 ENCOUNTER — Emergency Department: Payer: PPO

## 2019-10-11 ENCOUNTER — Emergency Department
Admission: EM | Admit: 2019-10-11 | Discharge: 2019-10-11 | Disposition: A | Discharge status: Other Acute Inpatient Hospital | Payer: PPO | Attending: Student | Admitting: Student

## 2019-10-11 ENCOUNTER — Encounter (HOSPITAL_COMMUNITY): Payer: Self-pay

## 2019-10-11 DIAGNOSIS — E1340 Other specified diabetes mellitus with diabetic neuropathy, unspecified: Secondary | ICD-10-CM | POA: Diagnosis not present

## 2019-10-11 DIAGNOSIS — I10 Essential (primary) hypertension: Secondary | ICD-10-CM | POA: Diagnosis not present

## 2019-10-11 DIAGNOSIS — E785 Hyperlipidemia, unspecified: Secondary | ICD-10-CM | POA: Diagnosis present

## 2019-10-11 DIAGNOSIS — I441 Atrioventricular block, second degree: Secondary | ICD-10-CM | POA: Diagnosis not present

## 2019-10-11 DIAGNOSIS — Z7902 Long term (current) use of antithrombotics/antiplatelets: Secondary | ICD-10-CM

## 2019-10-11 DIAGNOSIS — R0602 Shortness of breath: Secondary | ICD-10-CM

## 2019-10-11 DIAGNOSIS — E119 Type 2 diabetes mellitus without complications: Secondary | ICD-10-CM | POA: Insufficient documentation

## 2019-10-11 DIAGNOSIS — Z794 Long term (current) use of insulin: Secondary | ICD-10-CM | POA: Diagnosis not present

## 2019-10-11 DIAGNOSIS — I2510 Atherosclerotic heart disease of native coronary artery without angina pectoris: Secondary | ICD-10-CM | POA: Diagnosis not present

## 2019-10-11 DIAGNOSIS — I5041 Acute combined systolic (congestive) and diastolic (congestive) heart failure: Secondary | ICD-10-CM | POA: Diagnosis not present

## 2019-10-11 DIAGNOSIS — I110 Hypertensive heart disease with heart failure: Secondary | ICD-10-CM | POA: Diagnosis not present

## 2019-10-11 DIAGNOSIS — E1122 Type 2 diabetes mellitus with diabetic chronic kidney disease: Secondary | ICD-10-CM | POA: Diagnosis not present

## 2019-10-11 DIAGNOSIS — Z7982 Long term (current) use of aspirin: Secondary | ICD-10-CM | POA: Insufficient documentation

## 2019-10-11 DIAGNOSIS — J1289 Other viral pneumonia: Secondary | ICD-10-CM | POA: Diagnosis present

## 2019-10-11 DIAGNOSIS — J189 Pneumonia, unspecified organism: Secondary | ICD-10-CM | POA: Diagnosis not present

## 2019-10-11 DIAGNOSIS — R0902 Hypoxemia: Secondary | ICD-10-CM | POA: Diagnosis not present

## 2019-10-11 DIAGNOSIS — J9601 Acute respiratory failure with hypoxia: Secondary | ICD-10-CM | POA: Diagnosis present

## 2019-10-11 DIAGNOSIS — R7401 Elevation of levels of liver transaminase levels: Secondary | ICD-10-CM | POA: Diagnosis not present

## 2019-10-11 DIAGNOSIS — R069 Unspecified abnormalities of breathing: Secondary | ICD-10-CM | POA: Diagnosis not present

## 2019-10-11 DIAGNOSIS — E876 Hypokalemia: Secondary | ICD-10-CM | POA: Diagnosis not present

## 2019-10-11 DIAGNOSIS — M2550 Pain in unspecified joint: Secondary | ICD-10-CM | POA: Diagnosis not present

## 2019-10-11 DIAGNOSIS — Z79899 Other long term (current) drug therapy: Secondary | ICD-10-CM

## 2019-10-11 DIAGNOSIS — J440 Chronic obstructive pulmonary disease with acute lower respiratory infection: Secondary | ICD-10-CM | POA: Diagnosis not present

## 2019-10-11 DIAGNOSIS — E11649 Type 2 diabetes mellitus with hypoglycemia without coma: Secondary | ICD-10-CM | POA: Diagnosis not present

## 2019-10-11 DIAGNOSIS — R52 Pain, unspecified: Secondary | ICD-10-CM | POA: Diagnosis not present

## 2019-10-11 DIAGNOSIS — E1151 Type 2 diabetes mellitus with diabetic peripheral angiopathy without gangrene: Secondary | ICD-10-CM | POA: Diagnosis not present

## 2019-10-11 DIAGNOSIS — I472 Ventricular tachycardia: Secondary | ICD-10-CM | POA: Diagnosis not present

## 2019-10-11 DIAGNOSIS — N184 Chronic kidney disease, stage 4 (severe): Secondary | ICD-10-CM | POA: Diagnosis not present

## 2019-10-11 DIAGNOSIS — Z8546 Personal history of malignant neoplasm of prostate: Secondary | ICD-10-CM | POA: Diagnosis not present

## 2019-10-11 DIAGNOSIS — J9600 Acute respiratory failure, unspecified whether with hypoxia or hypercapnia: Secondary | ICD-10-CM | POA: Diagnosis not present

## 2019-10-11 DIAGNOSIS — F17210 Nicotine dependence, cigarettes, uncomplicated: Secondary | ICD-10-CM | POA: Diagnosis present

## 2019-10-11 DIAGNOSIS — J9621 Acute and chronic respiratory failure with hypoxia: Secondary | ICD-10-CM | POA: Diagnosis not present

## 2019-10-11 DIAGNOSIS — Z8673 Personal history of transient ischemic attack (TIA), and cerebral infarction without residual deficits: Secondary | ICD-10-CM | POA: Diagnosis not present

## 2019-10-11 DIAGNOSIS — Z951 Presence of aortocoronary bypass graft: Secondary | ICD-10-CM

## 2019-10-11 DIAGNOSIS — I5022 Chronic systolic (congestive) heart failure: Secondary | ICD-10-CM | POA: Insufficient documentation

## 2019-10-11 DIAGNOSIS — I130 Hypertensive heart and chronic kidney disease with heart failure and stage 1 through stage 4 chronic kidney disease, or unspecified chronic kidney disease: Principal | ICD-10-CM | POA: Diagnosis present

## 2019-10-11 DIAGNOSIS — E559 Vitamin D deficiency, unspecified: Secondary | ICD-10-CM | POA: Diagnosis not present

## 2019-10-11 DIAGNOSIS — U071 COVID-19: Secondary | ICD-10-CM | POA: Diagnosis not present

## 2019-10-11 DIAGNOSIS — I5023 Acute on chronic systolic (congestive) heart failure: Secondary | ICD-10-CM | POA: Diagnosis present

## 2019-10-11 DIAGNOSIS — R61 Generalized hyperhidrosis: Secondary | ICD-10-CM | POA: Diagnosis not present

## 2019-10-11 DIAGNOSIS — Z7401 Bed confinement status: Secondary | ICD-10-CM | POA: Diagnosis not present

## 2019-10-11 DIAGNOSIS — I5021 Acute systolic (congestive) heart failure: Secondary | ICD-10-CM | POA: Diagnosis not present

## 2019-10-11 DIAGNOSIS — R0689 Other abnormalities of breathing: Secondary | ICD-10-CM | POA: Diagnosis not present

## 2019-10-11 DIAGNOSIS — K219 Gastro-esophageal reflux disease without esophagitis: Secondary | ICD-10-CM | POA: Diagnosis not present

## 2019-10-11 DIAGNOSIS — E1169 Type 2 diabetes mellitus with other specified complication: Secondary | ICD-10-CM | POA: Diagnosis not present

## 2019-10-11 DIAGNOSIS — J449 Chronic obstructive pulmonary disease, unspecified: Secondary | ICD-10-CM | POA: Insufficient documentation

## 2019-10-11 DIAGNOSIS — M6281 Muscle weakness (generalized): Secondary | ICD-10-CM | POA: Diagnosis not present

## 2019-10-11 DIAGNOSIS — I469 Cardiac arrest, cause unspecified: Secondary | ICD-10-CM | POA: Diagnosis not present

## 2019-10-11 DIAGNOSIS — J1282 Pneumonia due to coronavirus disease 2019: Secondary | ICD-10-CM

## 2019-10-11 LAB — COMPREHENSIVE METABOLIC PANEL
ALT: 32 U/L (ref 0–44)
AST: 50 U/L — ABNORMAL HIGH (ref 15–41)
Albumin: 3.5 g/dL (ref 3.5–5.0)
Alkaline Phosphatase: 186 U/L — ABNORMAL HIGH (ref 38–126)
Anion gap: 10 (ref 5–15)
BUN: 16 mg/dL (ref 8–23)
CO2: 26 mmol/L (ref 22–32)
Calcium: 8.9 mg/dL (ref 8.9–10.3)
Chloride: 99 mmol/L (ref 98–111)
Creatinine, Ser: 1.75 mg/dL — ABNORMAL HIGH (ref 0.61–1.24)
GFR calc Af Amer: 43 mL/min — ABNORMAL LOW (ref >60)
GFR calc non Af Amer: 38 mL/min — ABNORMAL LOW (ref >60)
Glucose, Bld: 319 mg/dL — ABNORMAL HIGH (ref 70–99)
Potassium: 4.2 mmol/L (ref 3.5–5.1)
Sodium: 135 mmol/L (ref 135–145)
Total Bilirubin: 0.7 mg/dL (ref 0.3–1.2)
Total Protein: 7.1 g/dL (ref 6.5–8.1)

## 2019-10-11 LAB — BLOOD GAS, ARTERIAL
Acid-base deficit: 0.8 mmol/L (ref 0.0–2.0)
Bicarbonate: 24.9 mmol/L (ref 20.0–28.0)
FIO2: 0.5
O2 Saturation: 99 %
Patient temperature: 37
pCO2 arterial: 44 mmHg (ref 32.0–48.0)
pH, Arterial: 7.36 (ref 7.350–7.450)
pO2, Arterial: 136 mmHg — ABNORMAL HIGH (ref 83.0–108.0)

## 2019-10-11 LAB — POC SARS CORONAVIRUS 2 AG: SARS Coronavirus 2 Ag: POSITIVE — AB

## 2019-10-11 LAB — CBC WITH DIFFERENTIAL/PLATELET
Abs Immature Granulocytes: 0.18 10*3/uL — ABNORMAL HIGH (ref 0.00–0.07)
Basophils Absolute: 0.1 10*3/uL (ref 0.0–0.1)
Basophils Relative: 0 %
Eosinophils Absolute: 0.2 10*3/uL (ref 0.0–0.5)
Eosinophils Relative: 1 %
HCT: 51.8 % (ref 39.0–52.0)
Hemoglobin: 16.3 g/dL (ref 13.0–17.0)
Immature Granulocytes: 1 %
Lymphocytes Relative: 22 %
Lymphs Abs: 3.6 10*3/uL (ref 0.7–4.0)
MCH: 28.6 pg (ref 26.0–34.0)
MCHC: 31.5 g/dL (ref 30.0–36.0)
MCV: 91 fL (ref 80.0–100.0)
Monocytes Absolute: 0.9 10*3/uL (ref 0.1–1.0)
Monocytes Relative: 5 %
Neutro Abs: 11.5 10*3/uL — ABNORMAL HIGH (ref 1.7–7.7)
Neutrophils Relative %: 71 %
Platelets: 323 10*3/uL (ref 150–400)
RBC: 5.69 MIL/uL (ref 4.22–5.81)
RDW: 15.4 % (ref 11.5–15.5)
WBC: 16.4 10*3/uL — ABNORMAL HIGH (ref 4.0–10.5)
nRBC: 0 % (ref 0.0–0.2)

## 2019-10-11 LAB — TROPONIN I (HIGH SENSITIVITY)
Troponin I (High Sensitivity): 48 ng/L — ABNORMAL HIGH (ref <18)
Troponin I (High Sensitivity): 119 ng/L (ref <18)

## 2019-10-11 LAB — BLOOD GAS, VENOUS
Acid-base deficit: 3.4 mmol/L — ABNORMAL HIGH (ref 0.0–2.0)
Bicarbonate: 27.6 mmol/L (ref 20.0–28.0)
O2 Saturation: 12 %
Patient temperature: 37
pCO2, Ven: 81 mmHg (ref 44.0–60.0)
pH, Ven: 7.14 — CL (ref 7.250–7.430)
pO2, Ven: <31 mmHg — CL (ref 32.0–45.0)

## 2019-10-11 LAB — GLUCOSE, CAPILLARY
Glucose-Capillary: 314 mg/dL — ABNORMAL HIGH (ref 70–99)
Glucose-Capillary: 266 mg/dL — ABNORMAL HIGH (ref 70–99)
Glucose-Capillary: 136 mg/dL — ABNORMAL HIGH (ref 70–99)

## 2019-10-11 LAB — PROTIME-INR
INR: 0.9 (ref 0.8–1.2)
Prothrombin Time: 11.9 seconds (ref 11.4–15.2)

## 2019-10-11 LAB — MAGNESIUM: Magnesium: 2.4 mg/dL (ref 1.7–2.4)

## 2019-10-11 LAB — PROCALCITONIN: Procalcitonin: 0.66 ng/mL

## 2019-10-11 LAB — C-REACTIVE PROTEIN: CRP: 2.5 mg/dL — ABNORMAL HIGH (ref <1.0)

## 2019-10-11 LAB — BRAIN NATRIURETIC PEPTIDE: B Natriuretic Peptide: 1635 pg/mL — ABNORMAL HIGH (ref 0.0–100.0)

## 2019-10-11 LAB — FERRITIN: Ferritin: 89 ng/mL (ref 24–336)

## 2019-10-11 LAB — FIBRIN DERIVATIVES D-DIMER (ARMC ONLY): Fibrin derivatives D-dimer (ARMC): 918.29 ng/mL (FEU) — ABNORMAL HIGH (ref 0.00–499.00)

## 2019-10-11 LAB — LACTATE DEHYDROGENASE: LDH: 197 U/L — ABNORMAL HIGH (ref 98–192)

## 2019-10-11 MED ORDER — SODIUM CHLORIDE 0.9 % IV SOLN: 100.0000 mg | Freq: Every day | INTRAVENOUS | Status: DC

## 2019-10-11 MED ORDER — PROSIGHT PO TABS
1.0000 | ORAL_TABLET | Freq: Every day | ORAL | Status: DC
  Administered 2019-10-11 – 2019-10-15 (×5): 1 via ORAL
  Filled 2019-10-11 (×5): qty 1

## 2019-10-11 MED ORDER — MAGNESIUM OXIDE 400 (241.3 MG) MG PO TABS
200.0000 mg | ORAL_TABLET | Freq: Every day | ORAL | Status: DC
  Administered 2019-10-11 – 2019-10-15 (×6): 200 mg via ORAL
  Filled 2019-10-11 (×5): qty 1

## 2019-10-11 MED ORDER — SODIUM CHLORIDE 0.9 % IV SOLN
200.0000 mg | Freq: Once | INTRAVENOUS | Status: AC
  Administered 2019-10-11: 200 mg via INTRAVENOUS
  Filled 2019-10-11: qty 200

## 2019-10-11 MED ORDER — HYDROCOD POLST-CPM POLST ER 10-8 MG/5ML PO SUER: 5.0000 mL | Freq: Two times a day (BID) | ORAL | Status: DC | PRN

## 2019-10-11 MED ORDER — ADULT MULTIVITAMIN W/MINERALS CH
1.0000 | ORAL_TABLET | Freq: Every day | ORAL | Status: DC
  Administered 2019-10-11 – 2019-10-15 (×7): 1 via ORAL
  Filled 2019-10-11 (×6): qty 1

## 2019-10-11 MED ORDER — VITAMIN C 500 MG PO TABS
500.0000 mg | ORAL_TABLET | Freq: Every day | ORAL | Status: DC
  Administered 2019-10-12 – 2019-10-15 (×4): 500 mg via ORAL
  Filled 2019-10-11 (×5): qty 1

## 2019-10-11 MED ORDER — ZINC SULFATE 220 (50 ZN) MG PO CAPS
220.0000 mg | ORAL_CAPSULE | Freq: Every day | ORAL | Status: DC
  Administered 2019-10-11 – 2019-10-15 (×5): 220 mg via ORAL
  Filled 2019-10-11 (×7): qty 1

## 2019-10-11 MED ORDER — ASPIRIN EC 81 MG PO TBEC
81.0000 mg | DELAYED_RELEASE_TABLET | Freq: Every day | ORAL | Status: DC
  Administered 2019-10-12 – 2019-10-15 (×4): 81 mg via ORAL
  Filled 2019-10-11 (×5): qty 1

## 2019-10-11 MED ORDER — ONDANSETRON HCL 4 MG/2ML IJ SOLN: 4.0000 mg | Freq: Four times a day (QID) | INTRAMUSCULAR | Status: DC | PRN

## 2019-10-11 MED ORDER — LOPERAMIDE HCL 2 MG PO CAPS: 2.0000 mg | ORAL_CAPSULE | ORAL | Status: DC | PRN

## 2019-10-11 MED ORDER — ONDANSETRON HCL 4 MG PO TABS: 4.0000 mg | ORAL_TABLET | Freq: Four times a day (QID) | ORAL | Status: DC | PRN

## 2019-10-11 MED ORDER — ACETAMINOPHEN 325 MG PO TABS: 650.0000 mg | ORAL_TABLET | Freq: Four times a day (QID) | ORAL | Status: DC | PRN

## 2019-10-11 MED ORDER — SODIUM CHLORIDE 0.9 % IV SOLN
100.0000 mg | Freq: Every day | INTRAVENOUS | Status: AC
  Administered 2019-10-12 – 2019-10-15 (×4): 100 mg via INTRAVENOUS
  Filled 2019-10-11 (×5): qty 20

## 2019-10-11 MED ORDER — IPRATROPIUM-ALBUTEROL 0.5-2.5 (3) MG/3ML IN SOLN
3.0000 mL | Freq: Once | RESPIRATORY_TRACT | Status: AC
  Administered 2019-10-11: 3 mL via RESPIRATORY_TRACT
  Filled 2019-10-11: qty 3

## 2019-10-11 MED ORDER — BISACODYL 10 MG RE SUPP: 10.0000 mg | Freq: Every day | RECTAL | Status: DC | PRN

## 2019-10-11 MED ORDER — INSULIN ASPART 100 UNIT/ML ~~LOC~~ SOLN
0.0000 [IU] | Freq: Three times a day (TID) | SUBCUTANEOUS | Status: DC
  Administered 2019-10-11: 14:00:00 7 [IU] via SUBCUTANEOUS
  Administered 2019-10-11: 5 [IU] via SUBCUTANEOUS
  Administered 2019-10-12: 2 [IU] via SUBCUTANEOUS
  Administered 2019-10-12: 5 [IU] via SUBCUTANEOUS
  Administered 2019-10-13 – 2019-10-14 (×3): 3 [IU] via SUBCUTANEOUS
  Administered 2019-10-14: 16:00:00 1 [IU] via SUBCUTANEOUS
  Administered 2019-10-15: 08:00:00 2 [IU] via SUBCUTANEOUS
  Administered 2019-10-15: 12:00:00 3 [IU] via SUBCUTANEOUS

## 2019-10-11 MED ORDER — DEXAMETHASONE SODIUM PHOSPHATE 10 MG/ML IJ SOLN
6.0000 mg | INTRAMUSCULAR | Status: DC
  Administered 2019-10-11 – 2019-10-14 (×4): 6 mg via INTRAVENOUS
  Filled 2019-10-11 (×4): qty 1

## 2019-10-11 MED ORDER — VITAMIN D3 25 MCG PO TABS
1000.0000 [IU] | ORAL_TABLET | Freq: Every day | ORAL | Status: DC
  Administered 2019-10-11 – 2019-10-15 (×5): 1000 [IU] via ORAL
  Filled 2019-10-11 (×10): qty 1

## 2019-10-11 MED ORDER — ALBUTEROL SULFATE (2.5 MG/3ML) 0.083% IN NEBU
2.5000 mg | INHALATION_SOLUTION | Freq: Once | RESPIRATORY_TRACT | Status: AC
  Administered 2019-10-11: 2.5 mg via RESPIRATORY_TRACT
  Filled 2019-10-11: qty 3

## 2019-10-11 MED ORDER — GABAPENTIN 100 MG PO CAPS
100.0000 mg | ORAL_CAPSULE | Freq: Two times a day (BID) | ORAL | Status: DC
  Administered 2019-10-11 – 2019-10-15 (×9): 100 mg via ORAL
  Filled 2019-10-11 (×9): qty 1

## 2019-10-11 MED ORDER — SODIUM CHLORIDE 0.9 % IV SOLN
200.0000 mg | Freq: Once | INTRAVENOUS | Status: DC
  Filled 2019-10-11: qty 40

## 2019-10-11 MED ORDER — MAGNESIUM 200 MG PO TABS
250.0000 mg | ORAL_TABLET | Freq: Every day | ORAL | Status: DC
  Filled 2019-10-11: qty 2

## 2019-10-11 MED ORDER — INSULIN ASPART 100 UNIT/ML ~~LOC~~ SOLN
0.0000 [IU] | Freq: Every day | SUBCUTANEOUS | Status: DC
  Administered 2019-10-12: 21:00:00 4 [IU] via SUBCUTANEOUS
  Administered 2019-10-13 – 2019-10-14 (×2): 2 [IU] via SUBCUTANEOUS

## 2019-10-11 MED ORDER — CLOPIDOGREL BISULFATE 75 MG PO TABS
75.0000 mg | ORAL_TABLET | Freq: Every day | ORAL | Status: DC
  Administered 2019-10-11 – 2019-10-15 (×5): 75 mg via ORAL
  Filled 2019-10-11 (×5): qty 1

## 2019-10-11 MED ORDER — ENOXAPARIN SODIUM 40 MG/0.4ML ~~LOC~~ SOLN
40.0000 mg | SUBCUTANEOUS | Status: DC
  Administered 2019-10-11: 40 mg via SUBCUTANEOUS
  Filled 2019-10-11: qty 0.4

## 2019-10-11 MED ORDER — ALBUTEROL SULFATE HFA 108 (90 BASE) MCG/ACT IN AERS
2.0000 | INHALATION_SPRAY | Freq: Four times a day (QID) | RESPIRATORY_TRACT | Status: DC | PRN
  Filled 2019-10-11: qty 6.7

## 2019-10-11 MED ORDER — SENNA 8.6 MG PO TABS
1.0000 | ORAL_TABLET | Freq: Two times a day (BID) | ORAL | Status: DC
  Administered 2019-10-11 – 2019-10-15 (×9): 8.6 mg via ORAL
  Filled 2019-10-11 (×9): qty 1

## 2019-10-11 MED ORDER — VITAMIN C 500 MG PO TABS: 500.0000 mg | ORAL_TABLET | Freq: Every day | ORAL | Status: DC

## 2019-10-11 MED ORDER — PANTOPRAZOLE SODIUM 40 MG PO TBEC
40.0000 mg | DELAYED_RELEASE_TABLET | Freq: Every day | ORAL | Status: DC
  Administered 2019-10-11 – 2019-10-15 (×5): 40 mg via ORAL
  Filled 2019-10-11 (×5): qty 1

## 2019-10-11 MED ORDER — ASPIRIN 81 MG PO CHEW
324.0000 mg | CHEWABLE_TABLET | Freq: Once | ORAL | Status: AC
  Administered 2019-10-11: 324 mg via ORAL
  Filled 2019-10-11: qty 4

## 2019-10-11 MED ORDER — INSULIN GLARGINE 100 UNIT/ML ~~LOC~~ SOLN
53.0000 [IU] | Freq: Every day | SUBCUTANEOUS | Status: DC
  Administered 2019-10-11 – 2019-10-14 (×4): 53 [IU] via SUBCUTANEOUS
  Filled 2019-10-11 (×4): qty 0.53

## 2019-10-11 MED ORDER — METOPROLOL TARTRATE 50 MG PO TABS
50.0000 mg | ORAL_TABLET | Freq: Two times a day (BID) | ORAL | Status: DC
  Administered 2019-10-11 – 2019-10-12 (×2): 50 mg via ORAL
  Filled 2019-10-11 (×3): qty 1

## 2019-10-11 MED ORDER — POLYETHYLENE GLYCOL 3350 17 G PO PACK: 17.0000 g | PACK | Freq: Every day | ORAL | Status: DC | PRN

## 2019-10-11 MED ORDER — SIMVASTATIN 20 MG PO TABS
20.0000 mg | ORAL_TABLET | Freq: Every day | ORAL | Status: DC
  Administered 2019-10-11 – 2019-10-14 (×4): 20 mg via ORAL
  Filled 2019-10-11 (×4): qty 1

## 2019-10-11 MED ORDER — TRAMADOL HCL 50 MG PO TABS: 50.0000 mg | ORAL_TABLET | Freq: Two times a day (BID) | ORAL | Status: DC | PRN

## 2019-10-11 MED ORDER — FUROSEMIDE 10 MG/ML IJ SOLN
80.0000 mg | Freq: Two times a day (BID) | INTRAMUSCULAR | Status: DC
  Administered 2019-10-11 – 2019-10-13 (×4): 80 mg via INTRAVENOUS
  Filled 2019-10-11 (×5): qty 8

## 2019-10-11 MED ORDER — GUAIFENESIN-DM 100-10 MG/5ML PO SYRP: 10.0000 mL | ORAL_SOLUTION | ORAL | Status: DC | PRN

## 2019-10-11 MED ORDER — INSULIN GLARGINE 100 UNIT/ML SOLOSTAR PEN: 53.0000 [IU] | PEN_INJECTOR | Freq: Every day | SUBCUTANEOUS | Status: DC

## 2019-10-11 NOTE — ED Notes (Signed)
Report called to [place] at [facility], [phone]

## 2019-10-11 NOTE — ED Triage Notes (Signed)
Pt presents from home via acems with c/o shortness of breath. Pt on no oxygen normally. Pt took breathing tx at home with no relief. When ems arrived, pt 70% on room air. Pt placed on cpap by ems. Pt has hx of copd, chf, stroke. Pt current smoker. 2 albuterol tx, 125mg  solumedrol, 2g of Mg given in route. MD Monks at bedside. Pt respirations currently labored.

## 2019-10-11 NOTE — H&P (Signed)
History and Physical    [name] YNW:[id number] DOB: 26-Mar-1946 DOA: 10/11/2019  PCP: [name], MD Patient coming from: [facility] ED  Chief Complaint: Shortness of breath  HPI: 73 year old with a history of DM 2, CAD status post CABG, COPD, GERD, HLD, HTN, PVD, stroke, and chronic systolic CHF with EF 65% who presented to the [facility] ED with complaints of shortness of breath.  He was originally evaluated in the ED for this problem 11/23 but saturations were stable and a CXR was negative at that time and he was able to be sent home.  He was not Covid tested at that time.  He returned 12/8 with the acute onset of severe shortness of breath and was found to have oxygen saturations in the mid 70s on room air in the ED.  CXR on this visit noted increased peribronchial thickening suggestive of possible acute bronchitis versus pulmonary edema but no pathopneumonic findings of Covid pneumonia.  He was found to be Covid positive.  He was transition from BiPAP on arrival to high flow nasal cannula in the [facility] ED.  11/23 evaluated in [facility] ED 12/8 returned to [facility] ED - transferred to [facility]  Assessment/Plan  Acute exacerbation of Chronic systolic CHF -EF 78% BNP 4696 at time of presentation -peripheral edema on exam -increased diuresis -monitor weights and ins and outs -maximize medical therapy for CHF   COVID Pneumonia -acute hypoxic respiratory failure Continue remdesivir and steroid -wean oxygen as able -though the patient is currently on nonrebreather he is in no respiratory distress whatsoever and saturations are very favorable/approaching 100% -given elevated procalcitonin and only very mildly elevated CRP will not dose with Actemra at this time -follow-up CXR in a.m. with diuresis  Recent Labs  Lab 10/11/19 0021 10/11/19 0642  FERRITIN 89  --   CRP  --  2.5*  ALT 32  --   PROCALCITON  --  0.66    COPD - Ongoing tobacco abuse No active wheezing at this time -counseled on absolute  need to discontinue smoking  Mild transaminitis Likely due to Covid itself versus remdesivir -follow trend  CKD stage IV Creatinine 11/23 was 2.0 and 1.09 June 2018 - appears stable at baseline at this time - monitor trend   Recent Labs  Lab 10/11/19 0021  CREATININE 1.75*    HTN BP above goal - add nitrate - follow w/ diuresis  DM 2 Cont home tx and add SSI - follow closely on decadron   HLD Cont home medical tx  DVT prophylaxis: Lovenox Code Status: Full Family Communication: None Disposition Plan: Admit to progressive care Consults called: None  Review of Systems: As per HPI otherwise 10 point review of systems negative.   Past Medical History:  Diagnosis Date  . CHF (congestive heart failure) ([place])   . COPD (chronic obstructive pulmonary disease) ([place])   . Coronary artery disease   . Diabetes mellitus without complication ([place])   . GERD (gastroesophageal reflux disease)   . HLD (hyperlipidemia)   . Hypertension   . Peripheral vascular disease ([place])   . Pneumonia   . Prostate cancer ([place])   . Stroke [facility])     Past Surgical History:  Procedure Laterality Date  . COLONOSCOPY    . CORONARY ARTERY BYPASS GRAFT  2006  . EYE SURGERY    . GOLD SEED IMPLANT N/A 12/30/2016   Procedure: GOLD SEED IMPLANT  x3;  Surgeon: Hollice Espy, MD;  Location: [facility] ORS;  Service: Urology;  Laterality:  N/A;  . HEMORRHOID SURGERY N/A 10/02/2016   Procedure: PROCTOSCOPY AND CONTROL OF RECTAL BLEEDING;  Surgeon: Excell Seltzer, MD;  Location: [facility] ORS;  Service: General;  Laterality: N/A;  . PROSTATE BIOPSY    . TONSILLECTOMY      Family History  Family History  Problem Relation Age of Onset  . Lung cancer Mother   . Heart attack Father   . Hypertension Other   . Cancer Maternal Grandfather        prostate  . Cancer Paternal Grandmother        breast    Social History   reports that he has been smoking cigarettes. He has a 54.00 pack-year smoking history. He has  never used smokeless tobacco. He reports that he does not drink alcohol or use drugs.  Lives in [place] w/ his wife   Allergies No Known Allergies  Prior to Admission medications   Medication Sig Start Date End Date Taking? Authorizing Provider  acetaminophen (TYLENOL) 325 MG tablet Take 2 tablets (650 mg total) by mouth every 6 (six) hours as needed for mild pain (or Fever >/= 101). 01/20/16   Gouru, Illene Silver, MD  albuterol (PROVENTIL HFA;VENTOLIN HFA) 108 (90 Base) MCG/ACT inhaler Inhale 2 puffs into the lungs every 6 (six) hours as needed for wheezing or shortness of breath.    [provider]  aspirin EC 81 MG tablet Take 81 mg by mouth daily.    [provider]  cholecalciferol (VITAMIN D) 1000 units tablet Take 1,000 Units by mouth daily.    [provider]  clopidogrel (PLAVIX) 75 MG tablet Take 75 mg by mouth daily.  08/26/16   [provider]  CRANBERRY EXTRACT PO Take by mouth.    [provider]  furosemide (LASIX) 40 MG tablet Take 1 tablet (40 mg total) by mouth 2 (two) times daily. 06/27/18   Schuyler Amor, MD  gabapentin (NEURONTIN) 100 MG capsule Take 100 mg by mouth 2 (two) times daily.    [provider]  Ipratropium-Albuterol (COMBIVENT) 20-100 MCG/ACT AERS respimat Inhale 2 puffs into the lungs 4 (four) times daily as needed. 09/08/19 09/07/20  [provider]  LANTUS SOLOSTAR 100 UNIT/ML Solostar Pen Inject 53 Units into the skin daily at 10 pm.  10/07/16   [provider]  lisinopril (PRINIVIL,ZESTRIL) 40 MG tablet Take 40 mg by mouth daily.    [provider]  Magnesium 250 MG TABS Take 250 mg by mouth daily.    [provider]  metFORMIN (GLUCOPHAGE) 1000 MG tablet Take 1,000 mg by mouth 2 (two) times daily with a meal.    [provider]  metoprolol (LOPRESSOR) 50 MG tablet Take 50 mg by mouth 2 (two) times daily.    [provider]  Multiple Vitamin (MULTIVITAMIN WITH  MINERALS) TABS tablet Take 1 tablet by mouth daily.    [provider]  multivitamin-lutein (OCUVITE-LUTEIN) CAPS capsule Take 1 capsule by mouth daily.    [provider]  omeprazole (PRILOSEC) 20 MG capsule Take 20 mg by mouth daily.     [provider]  potassium chloride (K-DUR) 10 MEQ tablet Take 10 mEq by mouth daily.    [provider]  sacubitril-valsartan (ENTRESTO) 24-26 MG Take 1 tablet by mouth 2 (two) times daily. Patient not taking: Reported on 12/16/2016 01/09/16   Alisa Graff, FNP  simvastatin (ZOCOR) 20 MG tablet Take 20 mg by mouth at bedtime.    [provider]  tamsulosin (FLOMAX) 0.4 MG CAPS capsule Take 1 capsule (0.4 mg total) by mouth at bedtime. Patient not taking: Reported on 10/11/2019 10/29/17   Hollice Espy, MD  vitamin C (ASCORBIC ACID) 500 MG tablet Take 500 mg by mouth daily.    [provider]    Physical Exam: Vitals:   10/11/19 1120  BP: (!) 162/88  Pulse: 89  Resp: (!) 29  Temp: 97.9 F (36.6 C)  TempSrc: Oral  SpO2: 100%    Constitutional: NAD, calm, comfortable ENMT: Mucous membranes are moist. Posterior pharynx clear of any exudate or lesions.Normal dentition.  Neck: normal, supple, no masses, no thyromegaly Respiratory: Fine crackles diffusely with no active wheezing Cardiovascular: Regular rate and rhythm, no murmurs / rubs / gallops.  Abdomen: no tenderness, no masses palpated. No hepatosplenomegaly. Bowel sounds positive.  Musculoskeletal: no clubbing / cyanosis. No joint deformity upper and lower extremities. Good ROM, no contractures. Normal muscle tone.  Skin: no rashes, lesions, ulcers. No induration -1+ bilateral lower extremity edema with cutaneous change of chronic venous stasis dermatitis Neurologic: CN 2-12 grossly intact. Sensation intact, DTR normal. Strength 5/5 in all 4.  Psychiatric: Normal judgment and insight. Alert and oriented x 3. Normal mood.    Labs on  Admission:   CBC: Recent Labs  Lab 10/11/19 0021  WBC 16.4*  NEUTROABS 11.5*  HGB 16.3  HCT 51.8  MCV 91.0  PLT 841   Basic Metabolic Panel: Recent Labs  Lab 10/11/19 0021  NA 135  K 4.2  CL 99  CO2 26  GLUCOSE 319*  BUN 16  CREATININE 1.75*  CALCIUM 8.9   GFR: Estimated Creatinine Clearance: 36.7 mL/min (A) (by C-G formula based on SCr of 1.75 mg/dL (H)).   Liver Function Tests: Recent Labs  Lab 10/11/19 0021  AST 50*  ALT 32  ALKPHOS 186*  BILITOT 0.7  PROT 7.1  ALBUMIN 3.5   Coagulation Profile: Recent Labs  Lab 10/11/19 0021  INR 0.9   Anemia Panel: Recent Labs    10/11/19 0021  FERRITIN 89   Urine analysis:    Component Value Date/Time   COLORURINE YELLOW (A) 01/15/2016 2115   APPEARANCEUR CLEAR (A) 01/15/2016 2115   APPEARANCEUR Clear 04/28/2014 2346   LABSPEC 1.010 01/15/2016 2115   LABSPEC 1.009 04/28/2014 2346   PHURINE 5.0 01/15/2016 2115   GLUCOSEU NEGATIVE 01/15/2016 2115   GLUCOSEU Negative 04/28/2014 2346   HGBUR 1+ (A) 01/15/2016 2115   BILIRUBINUR NEGATIVE 01/15/2016 2115   BILIRUBINUR Negative 04/28/2014 2346   [place] 01/15/2016 2115   PROTEINUR 100 (A) 01/15/2016 2115   NITRITE NEGATIVE 01/15/2016 2115   LEUKOCYTESUR TRACE (A) 01/15/2016 2115   LEUKOCYTESUR 1+ 04/28/2014 2346    Recent Results (from the past 240 hour(s))  Culture, blood (routine x 2)     Status: None (Preliminary result)   Collection Time: 10/11/19 12:21 AM   Specimen: BLOOD  Result Value Ref Range Status   Specimen Description BLOOD RIGTH WRIST  Final   Special Requests   Final    BOTTLES DRAWN AEROBIC AND ANAEROBIC Blood Culture adequate volume   Culture   Final    NO GROWTH < 12 HOURS Performed at [facility], [address] [place], [place] [address]    Report Status PENDING  Incomplete  Culture, blood (routine x 2)     Status: None (Preliminary result)   Collection Time: 10/11/19 12:21 AM   Specimen: BLOOD  Result  Value Ref Range Status  Specimen Description BLOOD LEFT AC  Final   Special Requests   Final    BOTTLES DRAWN AEROBIC AND ANAEROBIC Blood Culture adequate volume   Culture   Final    NO GROWTH < 12 HOURS Performed at [facility], [address], [place], [place] [address]    Report Status PENDING  Incomplete     Radiological Exams on Admission: Dg Chest Port 1 View  Result Date: 10/11/2019 CLINICAL DATA:  Shortness of breath. EXAM: PORTABLE CHEST 1 VIEW COMPARISON:  Radiograph 09/26/2019 FINDINGS: Post median sternotomy and CABG. Borderline cardiomegaly with unchanged mediastinal contours. Chronic hyperinflation. Chronic pleuroparenchymal scarring at the left lung base. Increased peribronchial thickening from prior exam, possible Ker[place] B-lines at the bases. No pneumothorax. No acute osseous abnormalities. IMPRESSION: 1. Increased peribronchial thickening from prior exam, may represent acute bronchitis versus pulmonary edema, with possible septal thickening/Kerley B-lines at the right lung base 2. Chronic hyperinflation and pleuroparenchymal scarring at the left lung base. Electronically Signed   By: Keith Rake M.D.   On: 10/11/2019 01:21    Cherene Altes, MD [facility] Office  [phone] Pager - Text Page per Amion as per below:  On-Call/Text Page:      [email or web address]  If 7PM-7AM, please contact night-coverage [email or web address] 10/11/2019, 1:13 PM

## 2019-10-11 NOTE — ED Notes (Signed)
Pt placed on NRB for transport

## 2019-10-11 NOTE — Progress Notes (Signed)
Orders received from MD. Will hold Lopressor and continue to monitor.

## 2019-10-11 NOTE — Plan of Care (Signed)
Went over POC with pt and wife. All questions asked and all questions answered. 

## 2019-10-11 NOTE — ED Notes (Signed)
Report given to [facility], in route

## 2019-10-11 NOTE — ED Provider Notes (Addendum)
[facility] Emergency Department Provider Note  ____________________________________________   First MD Initiated Contact with Patient 10/11/19 0024     (approximate)  I have reviewed the triage vital signs and the nursing notes.  History  Chief Complaint Shortness of Breath    HPI [name] is a 73 y.o. male history of CAD status post CABG, COPD, CHF (EF 40%) who presents emergency department for shortness of breath.  Patient reports acute onset of shortness of breath this evening.  He does not normally wear supplemental oxygen, however on EMS arrival his saturation was in the mid 70s.  He was placed on CPAP, given 2 albuterol treatments, 125 mg Solu-Medrol, 2 g of magnesium. EMS reports maybe slight improvement with this. Patient reports some increased productive cough over the last day or so.  No fevers or sick contacts. No known COVID exposures. No chest pain or leg swelling.   Past Medical Hx Past Medical History:  Diagnosis Date  . CHF (congestive heart failure) ([place])   . COPD (chronic obstructive pulmonary disease) ([place])   . Coronary artery disease   . Diabetes mellitus without complication ([place])   . GERD (gastroesophageal reflux disease)   . HLD (hyperlipidemia)   . Hypertension   . Peripheral vascular disease ([place])   . Pneumonia   . Prostate cancer ([place])   . Stroke [facility])     Problem List Patient Active Problem List   Diagnosis Date Noted  . Malignant neoplasm of prostate ([place]) 11/24/2016  . Bright red rectal bleeding 10/02/2016  . COPD exacerbation ([place]) 01/17/2016  . Chronic systolic heart failure ([place]) 12/12/2015  . Essential hypertension 12/12/2015  . COPD (chronic obstructive pulmonary disease) with chronic bronchitis ([place]) 12/12/2015  . Diabetes ([place]) 12/12/2015  . Tobacco abuse 12/12/2015    Past Surgical Hx Past Surgical History:  Procedure Laterality Date  . COLONOSCOPY    . CORONARY ARTERY BYPASS GRAFT  2006  . EYE  SURGERY    . GOLD SEED IMPLANT N/A 12/30/2016   Procedure: GOLD SEED IMPLANT  x3;  Surgeon: Hollice Espy, MD;  Location: [facility] ORS;  Service: Urology;  Laterality: N/A;  . HEMORRHOID SURGERY N/A 10/02/2016   Procedure: PROCTOSCOPY AND CONTROL OF RECTAL BLEEDING;  Surgeon: Excell Seltzer, MD;  Location: [facility] ORS;  Service: General;  Laterality: N/A;  . PROSTATE BIOPSY    . TONSILLECTOMY      Medications Prior to Admission medications   Medication Sig Start Date End Date Taking? Authorizing Provider  acetaminophen (TYLENOL) 325 MG tablet Take 2 tablets (650 mg total) by mouth every 6 (six) hours as needed for mild pain (or Fever >/= 101). 01/20/16   Gouru, Illene Silver, MD  albuterol (PROVENTIL HFA;VENTOLIN HFA) 108 (90 Base) MCG/ACT inhaler Inhale 2 puffs into the lungs every 6 (six) hours as needed for wheezing or shortness of breath.    [provider]  aspirin EC 81 MG tablet Take 81 mg by mouth daily.    [provider]  cholecalciferol (VITAMIN D) 1000 units tablet Take 1,000 Units by mouth daily.    [provider]  clopidogrel (PLAVIX) 75 MG tablet Take 75 mg by mouth daily.  08/26/16   [provider]  Fluticasone-Salmeterol (ADVAIR DISKUS) 250-50 MCG/DOSE AEPB Inhale 1 puff into the lungs 2 (two) times daily. 12/07/15   Dustin Flock, MD  furosemide (LASIX) 40 MG tablet Take 1 tablet (40 mg total) by mouth 2 (two) times daily. 06/27/18   Schuyler Amor,  MD  Ipratropium-Albuterol (COMBIVENT) 20-100 MCG/ACT AERS respimat Inhale 2 puffs into the lungs 4 (four) times daily as needed. 09/08/19 09/07/20  [provider]  LANTUS SOLOSTAR 100 UNIT/ML Solostar Pen Inject 53 Units into the skin daily at 10 pm.  10/07/16   [provider]  lisinopril (PRINIVIL,ZESTRIL) 40 MG tablet Take 40 mg by mouth daily.    [provider]  Magnesium 250 MG TABS Take 250 mg by mouth daily.    [provider]  metFORMIN (GLUCOPHAGE) 1000 MG tablet  Take 1,000 mg by mouth 2 (two) times daily with a meal.    [provider]  metoprolol (LOPRESSOR) 50 MG tablet Take 50 mg by mouth 2 (two) times daily.    [provider]  Multiple Vitamin (MULTIVITAMIN WITH MINERALS) TABS tablet Take 1 tablet by mouth daily.    [provider]  multivitamin-lutein (OCUVITE-LUTEIN) CAPS capsule Take 1 capsule by mouth daily.    [provider]  omeprazole (PRILOSEC) 20 MG capsule Take 20 mg by mouth daily.     [provider]  potassium chloride (K-DUR) 10 MEQ tablet Take 10 mEq by mouth daily.    [provider]  sacubitril-valsartan (ENTRESTO) 24-26 MG Take 1 tablet by mouth 2 (two) times daily. Patient not taking: Reported on 12/16/2016 01/09/16   Alisa Graff, FNP  simvastatin (ZOCOR) 20 MG tablet Take 20 mg by mouth at bedtime.    [provider]  tamsulosin (FLOMAX) 0.4 MG CAPS capsule Take 1 capsule (0.4 mg total) by mouth at bedtime. 10/29/17   Hollice Espy, MD  vitamin C (ASCORBIC ACID) 500 MG tablet Take 500 mg by mouth daily.    [provider]    Allergies Patient has no known allergies.  Family Hx Family History  Problem Relation Age of Onset  . Lung cancer Mother   . Heart attack Father   . Hypertension Other   . Cancer Maternal Grandfather        prostate  . Cancer Paternal Grandmother        breast    Social Hx Social History   Tobacco Use  . Smoking status: Current Every Day Smoker    Packs/day: 1.00    Years: 54.00    Pack years: 54.00    Types: Cigarettes  . Smokeless tobacco: Never Used  Substance Use Topics  . Alcohol use: No    Alcohol/week: 0.0 standard drinks  . Drug use: No     Review of Systems  Constitutional: Negative for fever, chills. Eyes: Negative for visual changes. ENT: Negative for sore throat. Cardiovascular: Negative for chest pain. Respiratory: + for shortness of breath. Gastrointestinal: Negative for nausea, vomiting.   Genitourinary: Negative for dysuria. Musculoskeletal: Negative for leg swelling. Skin: Negative for rash. Neurological: Negative for for headaches.   Physical Exam  Vital Signs: ED Triage Vitals  Enc Vitals Group     BP 10/11/19 0022 (!) 177/93     Pulse --      Resp 10/11/19 0025 (!) 39     Temp 10/11/19 0027 98.4 F (36.9 C)     Temp Source 10/11/19 0027 Oral     SpO2 --      Weight 10/11/19 0023 175 lb (79.4 kg)     Height 10/11/19 0023 5\' 6"  (1.676 m)     Head Circumference --      Peak Flow --      Pain Score 10/11/19 0023 0  Pain Loc --      Pain Edu? --      Excl. in [place]? --     Constitutional: Alert and oriented. Appears SOB. Head: Normocephalic. Atraumatic. Eyes: Conjunctivae clear. Sclera anicteric. Nose: No congestion. No rhinorrhea. Mouth/Throat: Wearing mask. MM dry.  Neck: No stridor.   Cardiovascular: Normal rate, regular rhythm. Extremities well perfused. Respiratory: Increased work of breathing, tachypneic. Accessory muscle use. Decreased air movement throughout. RR 20-30s. Speaking in shortened sentences.  Gastrointestinal: Soft. Non-tender. Non-distended.  Musculoskeletal: No lower extremity edema. No deformities. Neurologic:  Normal speech and language. No gross focal neurologic deficits are appreciated.  Skin: Skin is warm, dry and intact. No rash noted. Psychiatric: Mood and affect are appropriate for situation.  EKG  Personally reviewed.   Rate: 93 Rhythm: question junctional Axis: RAD Intervals: normal Non-specific inferolateral ST/T wave findings, seen previously No STEMI    Radiology  CXR: IMPRESSION: 1. Increased peribronchial thickening from prior exam, may represent acute bronchitis versus pulmonary edema, with possible septal thickening/Kerley B-lines at the right lung base 2. Chronic hyperinflation and pleuroparenchymal scarring at the left lung base.   Procedures  Procedure(s) performed (including critical care):   .Critical Care Performed by: Lilia Pro., MD Authorized by: Lilia Pro., MD   Critical care provider statement:    Critical care time (minutes):  45   Critical care was time spent personally by me on the following activities:  Discussions with consultants, evaluation of patient's response to treatment, examination of patient, ordering and performing treatments and interventions, ordering and review of laboratory studies, ordering and review of radiographic studies, pulse oximetry, re-evaluation of patient's condition, obtaining history from patient or surrogate and review of old charts     Initial Impression / Assessment and Plan / ED Course  73 y.o. male who presents to the ED for shortness of breath, hypoxia, as above. Arrives on CPAP with EMS.  Ddx: COPD exacerbation, pulmonary infection, COVID, HF. Perhaps combination of multiple.   Will obtain labs, imaging, continue respiratory support, reassess.  COVID antigen positive. Discontinued BiPAP in favor of HFNC. VBG significant for respiratory acidosis with pH 7.14 and pCO2 81.  Patient's work of breathing significantly improved on high flow nasal cannula, maintaining oxygen in the high 90s. Less accessory muscle use.  Remainder of work-up notable for leukocytosis to 16, procalcitonin pending, inflammatory markers sent.  Blood cultures sent as well, though suspect presentation related to [place].  He has already received steroids with EMS, consulted pharmacy for remdesivir dosing.  Repeat ABG improving. pH 7.36, pCO2 44, pO2 136.  Will decrease from 50% FiO2 to 40% and continue to monitor.  Mildly elevated troponins at 48, 119, likely demand in the setting of his respiratory disease.  Discussed with [facility] who accepts patient for admission/transfer.   Final Clinical Impression(s) / ED Diagnosis  Final diagnoses:  SOB (shortness of breath)  Shortness of breath  COVID-19  Hypoxic       Note:  This document was prepared using  Dragon voice recognition software and may include unintentional dictation errors.  Lilia Pro., MD 10/11/19 [phone]

## 2019-10-11 NOTE — Progress Notes (Signed)
MD notified of HR dipping to the 40's and junctional brady rhythm. Patient sleeping, but wakes easy. 100% on NRB, RR 18 with accessory muscle use.

## 2019-10-11 NOTE — Progress Notes (Signed)
Inpatient Diabetes Program Recommendations  AACE/ADA: New Consensus Statement on Inpatient Glycemic Control (2015)  Target Ranges:  Prepandial:   less than 140 mg/dL      Peak postprandial:   less than 180 mg/dL (1-2 hours)      Critically ill patients:  140 - 180 mg/dL   Lab Results  Component Value Date   GLUCAP 196 (H) 12/30/2016   HGBA1C 9.3 (H) 12/04/2015    Review of Glycemic Control  Diabetes history: DM2 Outpatient Diabetes medications: Lantus 53 units daily + Metformin 1 gm bid Current orders for Inpatient glycemic control: Lantus 53 units daily   Inpatient Diabetes Program Recommendations:   -Glycemic control order set for CBGs qid with sensitive correction tid + hs 0-5 units Secure chat sent to Dr. Thereasa Solo  Thank you, Nani Gasser. Zaden Sako, RN, MSN, CDE  Diabetes Coordinator Inpatient Glycemic Control Team Team Pager [phone] (8am-5pm) 10/11/2019 1:57 PM

## 2019-10-11 NOTE — ED Notes (Signed)
Recollect of green top sent to lab

## 2019-10-11 NOTE — Progress Notes (Signed)
   10/11/19 1500  Family/Significant Other Communication  Family/Significant Other Update Called;Updated  Called wife and went over POC. All questions asked and all questions answered.

## 2019-10-11 NOTE — ED Notes (Signed)
Pt wife updated on pt status with consent.

## 2019-10-11 NOTE — ED Notes (Signed)
Pt being placed on bipap per verbal order MD [facility]

## 2019-10-11 NOTE — Progress Notes (Signed)
Remdesivir - Pharmacy Brief Note   O:  ALT:  CXR:  SpO2: % on    A/P:  Remdesivir 200 mg IVPB once followed by 100 mg IVPB daily x 4 days.   Hart Robinsons, PharmD Clinical Pharmacist  10/11/2019   10/11/2019 3:05 AM

## 2019-10-12 ENCOUNTER — Inpatient Hospital Stay (HOSPITAL_COMMUNITY): Payer: PPO

## 2019-10-12 DIAGNOSIS — J9621 Acute and chronic respiratory failure with hypoxia: Secondary | ICD-10-CM

## 2019-10-12 DIAGNOSIS — J1289 Other viral pneumonia: Secondary | ICD-10-CM

## 2019-10-12 DIAGNOSIS — J9600 Acute respiratory failure, unspecified whether with hypoxia or hypercapnia: Secondary | ICD-10-CM | POA: Diagnosis not present

## 2019-10-12 DIAGNOSIS — U071 COVID-19: Secondary | ICD-10-CM

## 2019-10-12 DIAGNOSIS — I5021 Acute systolic (congestive) heart failure: Secondary | ICD-10-CM

## 2019-10-12 DIAGNOSIS — I10 Essential (primary) hypertension: Secondary | ICD-10-CM

## 2019-10-12 LAB — COMPREHENSIVE METABOLIC PANEL
ALT: 30 U/L (ref 0–44)
AST: 23 U/L (ref 15–41)
Albumin: 3.1 g/dL — ABNORMAL LOW (ref 3.5–5.0)
Alkaline Phosphatase: 135 U/L — ABNORMAL HIGH (ref 38–126)
Anion gap: 12 (ref 5–15)
BUN: 29 mg/dL — ABNORMAL HIGH (ref 8–23)
CO2: 28 mmol/L (ref 22–32)
Calcium: 9.3 mg/dL (ref 8.9–10.3)
Chloride: 100 mmol/L (ref 98–111)
Creatinine, Ser: 1.81 mg/dL — ABNORMAL HIGH (ref 0.61–1.24)
GFR calc Af Amer: 42 mL/min — ABNORMAL LOW (ref >60)
GFR calc non Af Amer: 36 mL/min — ABNORMAL LOW (ref >60)
Glucose, Bld: 89 mg/dL (ref 70–99)
Potassium: 5.1 mmol/L (ref 3.5–5.1)
Sodium: 140 mmol/L (ref 135–145)
Total Bilirubin: 0.6 mg/dL (ref 0.3–1.2)
Total Protein: 6.4 g/dL — ABNORMAL LOW (ref 6.5–8.1)

## 2019-10-12 LAB — ECHOCARDIOGRAM COMPLETE
Height: 66 in
Weight: 2797.2 oz

## 2019-10-12 LAB — CBC WITH DIFFERENTIAL/PLATELET
Abs Immature Granulocytes: 0.07 10*3/uL (ref 0.00–0.07)
Basophils Absolute: 0 10*3/uL (ref 0.0–0.1)
Basophils Relative: 0 %
Eosinophils Absolute: 0 10*3/uL (ref 0.0–0.5)
Eosinophils Relative: 0 %
HCT: 48.5 % (ref 39.0–52.0)
Hemoglobin: 15 g/dL (ref 13.0–17.0)
Immature Granulocytes: 1 %
Lymphocytes Relative: 6 %
Lymphs Abs: 0.7 10*3/uL (ref 0.7–4.0)
MCH: 28.6 pg (ref 26.0–34.0)
MCHC: 30.9 g/dL (ref 30.0–36.0)
MCV: 92.4 fL (ref 80.0–100.0)
Monocytes Absolute: 0.4 10*3/uL (ref 0.1–1.0)
Monocytes Relative: 4 %
Neutro Abs: 9.6 10*3/uL — ABNORMAL HIGH (ref 1.7–7.7)
Neutrophils Relative %: 89 %
Platelets: 232 10*3/uL (ref 150–400)
RBC: 5.25 MIL/uL (ref 4.22–5.81)
RDW: 15.5 % (ref 11.5–15.5)
WBC: 10.7 10*3/uL — ABNORMAL HIGH (ref 4.0–10.5)
nRBC: 0 % (ref 0.0–0.2)

## 2019-10-12 LAB — GLUCOSE, CAPILLARY
Glucose-Capillary: 120 mg/dL — ABNORMAL HIGH (ref 70–99)
Glucose-Capillary: 258 mg/dL — ABNORMAL HIGH (ref 70–99)
Glucose-Capillary: 162 mg/dL — ABNORMAL HIGH (ref 70–99)
Glucose-Capillary: 307 mg/dL — ABNORMAL HIGH (ref 70–99)

## 2019-10-12 LAB — PROCALCITONIN: Procalcitonin: 1.09 ng/mL

## 2019-10-12 LAB — MAGNESIUM: Magnesium: 2.5 mg/dL — ABNORMAL HIGH (ref 1.7–2.4)

## 2019-10-12 LAB — ABO/RH: ABO/RH(D): O POS

## 2019-10-12 LAB — C-REACTIVE PROTEIN: CRP: 2.5 mg/dL — ABNORMAL HIGH (ref <1.0)

## 2019-10-12 LAB — HEMOGLOBIN A1C
Hgb A1c MFr Bld: 8.9 % — ABNORMAL HIGH (ref 4.8–5.6)
Mean Plasma Glucose: 208.73 mg/dL

## 2019-10-12 LAB — FERRITIN: Ferritin: 66 ng/mL (ref 24–336)

## 2019-10-12 LAB — D-DIMER, QUANTITATIVE: D-Dimer, Quant: >20 ug/mL-FEU — ABNORMAL HIGH (ref 0.00–0.50)

## 2019-10-12 MED ORDER — ENOXAPARIN SODIUM 80 MG/0.8ML ~~LOC~~ SOLN
80.0000 mg | Freq: Two times a day (BID) | SUBCUTANEOUS | Status: DC
  Administered 2019-10-12 – 2019-10-13 (×4): 80 mg via SUBCUTANEOUS
  Filled 2019-10-12 (×4): qty 0.8

## 2019-10-12 MED ORDER — METOPROLOL TARTRATE 25 MG PO TABS
25.0000 mg | ORAL_TABLET | Freq: Two times a day (BID) | ORAL | Status: DC
  Administered 2019-10-12 – 2019-10-13 (×2): 25 mg via ORAL
  Filled 2019-10-12 (×2): qty 1

## 2019-10-12 NOTE — Plan of Care (Signed)
  Problem: Education: Goal: Knowledge of risk factors and measures for prevention of condition will improve Outcome: Progressing   Problem: Coping: Goal: Psychosocial and spiritual needs will be supported Outcome: Progressing   Problem: Respiratory: Goal: Will maintain a patent airway Outcome: Progressing Goal: Complications related to the disease process, condition or treatment will be avoided or minimized Outcome: Progressing   

## 2019-10-12 NOTE — Progress Notes (Signed)
PROGRESS NOTE                                                                                                                                                                                                             Patient Demographics:    [name], is a 73 y.o. male, DOB - 1946/08/26, PXT:[id number]  Outpatient Primary MD for the patient is [name], MD   Admit date - 10/11/2019   LOS - 1  No chief complaint on file.      Brief Narrative: Patient is a 73 y.o. male with PMHx of chronic systolic heart failure, COPD s/p CABG, PAD, CVA, COPD who presented to [facility] ED with shortness of breath-he was found to have acute hypoxic respiratory failure secondary to COVID-19 pneumonia and decompensated systolic heart failure.  See below for further details   Subjective:    [name] today feels better, stting at bedside chair eating breakfast. He was titrateddown to 2 L of oxygen earlier this morning  Assessment  & Plan :   Acute Hypoxic Resp Failure due to Covid 19 Viral pneumonia and decompensated systolic heart failure: Improved-oxygen requirements are slowly coming down (initially 100% NRB).  Continue steroids/remdesivir and diuretics.  Fever: afebrile  O2 requirements:  SpO2: 99 % O2 Flow Rate (L/min): 5 L/min   COVID-19 Labs: Recent Labs    10/11/19 0021 10/11/19 0642 10/12/19 0057  DDIMER  --   --  >20.00*  FERRITIN 89  --  66  LDH 197*  --   --   CRP  --  2.5* 2.5*       Component Value Date/Time   BNP 1,635.0 (H) 10/11/2019 0021    Recent Labs  Lab 10/11/19 0642 10/12/19 0057  PROCALCITON 0.66 1.09    No results found for: SARSCOV2NAA   COVID-19 Medications: Steroids: 12/8>> Remdesivir: 12/7>>   Other medications: Antibiotics:Not needed as no evidence of bacterial infection  Prone/Incentive Spirometry: encouraged  incentive spirometry use 3-4/hour.  DVT Prophylaxis  :   Lovenox-at therapeutic dosing  Elevated D-dimer: Change to therapeutic Lovenox-check lower extremity Dopplers-given elevated creatinine-hold off on CTA chest.  Will check echo-trend D-dimer.  CAD s/p CABG: No anginal symptoms-continue dual antiplatelet agents (outpatient regimen) along with statin/metoprolol  PAD/CVA: Appears stable-continue antiplatelet agents and statin.  DM-2 (A1c 8.9 on 12/9): CBGs relatively  stable-continue Lantus 53 units daily, and SSI.  Follow and adjust.  CBG (last 3)  Recent Labs    10/11/19 2239 10/12/19 0836 10/12/19 1209  GLUCAP 136* 120* 258*   HTN: Controlled-continue metoprolol and Lasix.  COPD: Stable-continue bronchodilators.  Consults  :  None  Procedures  :  None  ABG:    Component Value Date/Time   PHART 7.36 10/11/2019 0329   PCO2ART 44 10/11/2019 0329   PO2ART 136 (H) 10/11/2019 0329   HCO3 24.9 10/11/2019 0329   TCO2 26 10/02/2016 1632   ACIDBASEDEF 0.8 10/11/2019 0329   O2SAT 99.0 10/11/2019 0329    Vent Settings: N/A  Condition - Extremely Guarded-  Family Communication  :  Spouse updated over the phone  Code Status :  Full Code  Diet :  Diet Order            Diet heart healthy/carb modified Room service appropriate? Yes; Fluid consistency: Thin  Diet effective now               Disposition Plan  :  Remain hospitalized  Barriers to discharge: Hypoxia requiring O2 supplementation/complete 5 days of IV Remdesivir  Antimicorbials  :    Anti-infectives (From admission, onward)   Start     Dose/Rate Route Frequency Ordered Stop   10/12/19 1000  remdesivir 100 mg in sodium chloride 0.9 % 100 mL IVPB  Status:  Discontinued     100 mg 200 mL/hr over 30 Minutes Intravenous Daily 10/11/19 1334 10/11/19 1342   10/12/19 1000  remdesivir 100 mg in sodium chloride 0.9 % 100 mL IVPB     100 mg 200 mL/hr over 30 Minutes Intravenous Daily 10/11/19 1342 10/16/19 0959   10/11/19 1345  remdesivir 200 mg in sodium chloride  0.9% 250 mL IVPB  Status:  Discontinued     200 mg 580 mL/hr over 30 Minutes Intravenous Once 10/11/19 1334 10/11/19 1342      Inpatient Medications  Scheduled Meds:  aspirin EC  81 mg Oral Daily   clopidogrel  75 mg Oral Daily   dexamethasone (DECADRON) injection  6 mg Intravenous Q24H   enoxaparin (LOVENOX) injection  80 mg Subcutaneous Q12H   furosemide  80 mg Intravenous Q12H   gabapentin  100 mg Oral BID   insulin aspart  0-5 Units Subcutaneous QHS   insulin aspart  0-9 Units Subcutaneous TID WC   insulin glargine  53 Units Subcutaneous QHS   magnesium oxide  200 mg Oral Daily   metoprolol tartrate  50 mg Oral BID   multivitamin  1 tablet Oral Daily   multivitamin with minerals  1 tablet Oral Daily   pantoprazole  40 mg Oral Daily   senna  1 tablet Oral BID   simvastatin  20 mg Oral QHS   vitamin C  500 mg Oral Daily   cholecalciferol  1,000 Units Oral Daily   zinc sulfate  220 mg Oral Daily   Continuous Infusions:  remdesivir 100 mg in NS 100 mL     PRN Meds:.acetaminophen, albuterol, bisacodyl, chlorpheniramine-HYDROcodone, guaiFENesin-dextromethorphan, loperamide, ondansetron **OR** ondansetron (ZOFRAN) IV, polyethylene glycol, traMADol   Time Spent in minutes  35   See all Orders from today for further details   Oren Binet M.D on 10/12/2019 at 9:06 AM  To page go to [email or web address] - use universal password  [facility] -  Office  [phone]    Objective:   Vitals:   10/12/19 0200 10/12/19 0436 10/12/19 0835 10/12/19  0857  BP:  (!) 153/73 (!) 147/60   Pulse:  (!) 56 (!) 59 68  Resp:  15    Temp:  98.1 F (36.7 C) 97.8 F (36.6 C)   TempSrc:  Axillary Oral   SpO2:  100% 99% 99%  Weight: 79.3 kg     Height: 5\' 6"  (1.676 m)       Wt Readings from Last 3 Encounters:  10/12/19 79.3 kg  10/11/19 79.4 kg  09/26/19 79.4 kg     Intake/Output Summary (Last 24 hours) at 10/12/2019 0906 Last data filed at 10/12/2019  0500 Gross per 24 hour  Intake 780 ml  Output 1075 ml  Net -295 ml     Physical Exam Gen Exam:Alert awake-not in any distress HEENT:atraumatic, normocephalic Chest: B/L clear to auscultation anteriorly CVS:S1S2 regular Abdomen:soft non tender, non distended Extremities:no edema Neurology: Non focal Skin: no rash   Data Review:    CBC Recent Labs  Lab 10/11/19 0021 10/12/19 0057  WBC 16.4* 10.7*  HGB 16.3 15.0  HCT 51.8 48.5  PLT 323 232  MCV 91.0 92.4  MCH 28.6 28.6  MCHC 31.5 30.9  RDW 15.4 15.5  LYMPHSABS 3.6 0.7  MONOABS 0.9 0.4  EOSABS 0.2 0.0  BASOSABS 0.1 0.0    Chemistries  Recent Labs  Lab 10/11/19 0021 10/11/19 1950 10/12/19 0057  NA 135  --  140  K 4.2  --  5.1  CL 99  --  100  CO2 26  --  28  GLUCOSE 319*  --  89  BUN 16  --  29*  CREATININE 1.75*  --  1.81*  CALCIUM 8.9  --  9.3  MG  --  2.4 2.5*  AST 50*  --  23  ALT 32  --  30  ALKPHOS 186*  --  135*  BILITOT 0.7  --  0.6   ------------------------------------------------------------------------------------------------------------------ No results for input(s): CHOL, HDL, LDLCALC, TRIG, CHOLHDL, LDLDIRECT in the last 72 hours.  Lab Results  Component Value Date   HGBA1C 9.3 (H) 12/04/2015   ------------------------------------------------------------------------------------------------------------------ No results for input(s): TSH, T4TOTAL, T3FREE, THYROIDAB in the last 72 hours.  Invalid input(s): FREET3 ------------------------------------------------------------------------------------------------------------------ Recent Labs    10/11/19 0021 10/12/19 0057  FERRITIN 89 66    Coagulation profile Recent Labs  Lab 10/11/19 0021  INR 0.9    Recent Labs    10/12/19 0057  DDIMER >20.00*    Cardiac Enzymes No results for input(s): CKMB, TROPONINI, MYOGLOBIN in the last 168 hours.  Invalid input(s):  CK ------------------------------------------------------------------------------------------------------------------    Component Value Date/Time   BNP 1,635.0 (H) 10/11/2019 0021    Micro Results Recent Results (from the past 240 hour(s))  Culture, blood (routine x 2)     Status: None (Preliminary result)   Collection Time: 10/11/19 12:21 AM   Specimen: BLOOD  Result Value Ref Range Status   Specimen Description BLOOD RIGTH WRIST  Final   Special Requests   Final    BOTTLES DRAWN AEROBIC AND ANAEROBIC Blood Culture adequate volume   Culture   Final    NO GROWTH 1 DAY Performed at [facility], [address] [place], [place] [address]    Report Status PENDING  Incomplete  Culture, blood (routine x 2)     Status: None (Preliminary result)   Collection Time: 10/11/19 12:21 AM   Specimen: BLOOD  Result Value Ref Range Status   Specimen Description BLOOD LEFT [facility]  Final   Special Requests  Final    BOTTLES DRAWN AEROBIC AND ANAEROBIC Blood Culture adequate volume   Culture   Final    NO GROWTH 1 DAY Performed at [facility], [address] [place], [place] [address]    Report Status PENDING  Incomplete    Radiology Reports Dg Chest 2 View  Result Date: 09/26/2019 CLINICAL DATA:  73 year old male with increasing shortness of breath. EXAM: CHEST - 2 VIEW COMPARISON:  Chest radiographs 06/27/2018 and earlier. FINDINGS: Evidence of left lung base fibrothorax on 2017 CT Abdomen and Pelvis. Stable lung volumes. Stable cardiac size and mediastinal contours. Prior CABG. No pneumothorax or pulmonary edema. Chronic left lung base pleural scarring with no evidence of acute pleural effusion. No acute pulmonary opacity identified. No acute osseous abnormality identified. Negative visible bowel gas pattern. IMPRESSION: Chronic left lung base fibrothorax. No acute cardiopulmonary abnormality. Electronically Signed   By: Genevie Ann M.D.   On: 09/26/2019 05:04   Dg Chest  Port 1 View  Result Date: 10/12/2019 CLINICAL DATA:  73 year old with shortness of breath who is COVID-19 positive. EXAM: PORTABLE CHEST 1 VIEW COMPARISON:  10/11/2019 and earlier. FINDINGS: Prior sternotomy for CABG. Cardiac silhouette mildly enlarged, unchanged. Mild interstitial pulmonary edema present yesterday has resolved. Pulmonary vascularity now normal. Prominent bronchovascular markings diffusely and central peribronchial thickening, unchanged. No visible confluent or ground-glass airspace opacity currently. Chronic pleuroparenchymal scarring at the LEFT base. IMPRESSION: 1. Resolution of interstitial pulmonary edema since yesterday. 2. Stable COPD/emphysema. No acute cardiopulmonary disease. 3. Stable mild cardiomegaly without pulmonary edema. Electronically Signed   By: Evangeline Dakin M.D.   On: 10/12/2019 08:30   Dg Chest Port 1 View  Result Date: 10/11/2019 CLINICAL DATA:  Shortness of breath. EXAM: PORTABLE CHEST 1 VIEW COMPARISON:  Radiograph 09/26/2019 FINDINGS: Post median sternotomy and CABG. Borderline cardiomegaly with unchanged mediastinal contours. Chronic hyperinflation. Chronic pleuroparenchymal scarring at the left lung base. Increased peribronchial thickening from prior exam, possible Kerley B-lines at the bases. No pneumothorax. No acute osseous abnormalities. IMPRESSION: 1. Increased peribronchial thickening from prior exam, may represent acute bronchitis versus pulmonary edema, with possible septal thickening/Kerley B-lines at the right lung base 2. Chronic hyperinflation and pleuroparenchymal scarring at the left lung base. Electronically Signed   By: Keith Rake M.D.   On: 10/11/2019 01:21

## 2019-10-12 NOTE — Progress Notes (Signed)
  Echocardiogram 2D Echocardiogram has been performed.  Bobbye Charleston 10/12/2019, 3:22 PM

## 2019-10-12 NOTE — Progress Notes (Signed)
ANTICOAGULATION CONSULT NOTE - Initial Consult  Pharmacy Consult for Lovenox Indication: Elevated D-dimer / r/o clot  No Known Allergies  Patient Measurements: Height: 5\' 6"  (167.6 cm) Weight: 174 lb 13.2 oz (79.3 kg) IBW/kg (Calculated) : 63.8  Vital Signs: Temp: 98.1 F (36.7 C) (12/09 0436) Temp Source: Axillary (12/09 0436) BP: 153/73 (12/09 0436) Pulse Rate: 56 (12/09 0436)  Labs: Recent Labs    10/11/19 0021 10/11/19 0354 10/12/19 0057  HGB 16.3  --  15.0  HCT 51.8  --  48.5  PLT 323  --  232  LABPROT 11.9  --   --   INR 0.9  --   --   CREATININE 1.75*  --  1.81*  TROPONINIHS 48* 119*  --     Estimated Creatinine Clearance: 35.5 mL/min (A) (by C-G formula based on SCr of 1.81 mg/dL (H)).   Medical History: Past Medical History:  Diagnosis Date  . CHF (congestive heart failure) ([place])   . COPD (chronic obstructive pulmonary disease) ([place])   . Coronary artery disease   . Diabetes mellitus without complication ([place])   . GERD (gastroesophageal reflux disease)   . HLD (hyperlipidemia)   . Hypertension   . Peripheral vascular disease ([place])   . Pneumonia   . Prostate cancer ([place])   . Stroke [facility])    Assessment: 73 yo M of presents with SOB. Ddimer > 20. Pharmacy consulted to give treatment dose of anticoag for now. SCr currently 1.81, near baseline with CKD stage IV. Last dose of Lovenox 40mg  was yesterday at 1400. CBC stable.  Goal of Therapy:  Anti-Xa level 0.6-1 units/ml 4hrs after LMWH dose given Monitor platelets by anticoagulation protocol: Yes   Plan:  Start enoxaparin 80mg  [place] Q12h Monitor renal function closely, CBC, s/s of bleed  Elenor Quinones, PharmD, BCPS, BCIDP Clinical Pharmacist 10/12/2019 8:03 AM

## 2019-10-12 NOTE — Plan of Care (Signed)
  Problem: Education: Goal: Knowledge of risk factors and measures for prevention of condition will improve 10/12/2019 1739 by Levie Heritage, RN Outcome: Progressing 10/12/2019 1737 by Levie Heritage, RN Outcome: Progressing   Problem: Coping: Goal: Psychosocial and spiritual needs will be supported 10/12/2019 1739 by Levie Heritage, RN Outcome: Progressing 10/12/2019 1737 by Levie Heritage, RN Outcome: Progressing   Problem: Respiratory: Goal: Will maintain a patent airway 10/12/2019 1739 by Levie Heritage, RN Outcome: Progressing 10/12/2019 1737 by Levie Heritage, RN Outcome: Progressing Goal: Complications related to the disease process, condition or treatment will be avoided or minimized 10/12/2019 1739 by Levie Heritage, RN Outcome: Progressing 10/12/2019 1737 by Levie Heritage, RN Outcome: Progressing

## 2019-10-13 ENCOUNTER — Inpatient Hospital Stay (HOSPITAL_COMMUNITY): Payer: PPO

## 2019-10-13 DIAGNOSIS — U071 COVID-19: Secondary | ICD-10-CM

## 2019-10-13 DIAGNOSIS — J1282 Pneumonia due to coronavirus disease 2019: Secondary | ICD-10-CM

## 2019-10-13 LAB — CBC WITH DIFFERENTIAL/PLATELET
Abs Immature Granulocytes: 0.06 10*3/uL (ref 0.00–0.07)
Basophils Absolute: 0 10*3/uL (ref 0.0–0.1)
Basophils Relative: 0 %
Eosinophils Absolute: 0 10*3/uL (ref 0.0–0.5)
Eosinophils Relative: 0 %
HCT: 47.3 % (ref 39.0–52.0)
Hemoglobin: 14.9 g/dL (ref 13.0–17.0)
Immature Granulocytes: 0 %
Lymphocytes Relative: 6 %
Lymphs Abs: 0.8 10*3/uL (ref 0.7–4.0)
MCH: 28.5 pg (ref 26.0–34.0)
MCHC: 31.5 g/dL (ref 30.0–36.0)
MCV: 90.6 fL (ref 80.0–100.0)
Monocytes Absolute: 0.6 10*3/uL (ref 0.1–1.0)
Monocytes Relative: 5 %
Neutro Abs: 12.4 10*3/uL — ABNORMAL HIGH (ref 1.7–7.7)
Neutrophils Relative %: 89 %
Platelets: 308 10*3/uL (ref 150–400)
RBC: 5.22 MIL/uL (ref 4.22–5.81)
RDW: 15.2 % (ref 11.5–15.5)
WBC: 14 10*3/uL — ABNORMAL HIGH (ref 4.0–10.5)
nRBC: 0 % (ref 0.0–0.2)

## 2019-10-13 LAB — COMPREHENSIVE METABOLIC PANEL
ALT: 33 U/L (ref 0–44)
AST: 24 U/L (ref 15–41)
Albumin: 3.4 g/dL — ABNORMAL LOW (ref 3.5–5.0)
Alkaline Phosphatase: 132 U/L — ABNORMAL HIGH (ref 38–126)
Anion gap: 17 — ABNORMAL HIGH (ref 5–15)
BUN: 43 mg/dL — ABNORMAL HIGH (ref 8–23)
CO2: 25 mmol/L (ref 22–32)
Calcium: 9 mg/dL (ref 8.9–10.3)
Chloride: 93 mmol/L — ABNORMAL LOW (ref 98–111)
Creatinine, Ser: 1.66 mg/dL — ABNORMAL HIGH (ref 0.61–1.24)
GFR calc Af Amer: 46 mL/min — ABNORMAL LOW (ref >60)
GFR calc non Af Amer: 40 mL/min — ABNORMAL LOW (ref >60)
Glucose, Bld: 161 mg/dL — ABNORMAL HIGH (ref 70–99)
Potassium: 4.8 mmol/L (ref 3.5–5.1)
Sodium: 135 mmol/L (ref 135–145)
Total Bilirubin: 0.5 mg/dL (ref 0.3–1.2)
Total Protein: 6.7 g/dL (ref 6.5–8.1)

## 2019-10-13 LAB — MAGNESIUM: Magnesium: 2.3 mg/dL (ref 1.7–2.4)

## 2019-10-13 LAB — GLUCOSE, CAPILLARY
Glucose-Capillary: 101 mg/dL — ABNORMAL HIGH (ref 70–99)
Glucose-Capillary: 250 mg/dL — ABNORMAL HIGH (ref 70–99)
Glucose-Capillary: 216 mg/dL — ABNORMAL HIGH (ref 70–99)
Glucose-Capillary: 216 mg/dL — ABNORMAL HIGH (ref 70–99)

## 2019-10-13 LAB — PROCALCITONIN: Procalcitonin: 0.85 ng/mL

## 2019-10-13 LAB — D-DIMER, QUANTITATIVE: D-Dimer, Quant: 1.21 ug/mL-FEU — ABNORMAL HIGH (ref 0.00–0.50)

## 2019-10-13 LAB — FERRITIN: Ferritin: 64 ng/mL (ref 24–336)

## 2019-10-13 LAB — C-REACTIVE PROTEIN: CRP: 1.2 mg/dL — ABNORMAL HIGH (ref <1.0)

## 2019-10-13 MED ORDER — FUROSEMIDE 10 MG/ML IJ SOLN
60.0000 mg | Freq: Two times a day (BID) | INTRAMUSCULAR | Status: DC
  Administered 2019-10-13 – 2019-10-14 (×2): 60 mg via INTRAVENOUS
  Filled 2019-10-13 (×2): qty 6

## 2019-10-13 MED ORDER — METOPROLOL TARTRATE 50 MG PO TABS
50.0000 mg | ORAL_TABLET | Freq: Two times a day (BID) | ORAL | Status: DC
  Administered 2019-10-13: 23:00:00 50 mg via ORAL
  Filled 2019-10-13: qty 1

## 2019-10-13 NOTE — Evaluation (Signed)
Physical Therapy Evaluation Patient Details Name: [name] MRN: [id number] DOB: Jan 18, 1946 Today's Date: 10/13/2019   History of Present Illness  73 year old with a history of DM 2, CAD status post CABG, COPD, GERD, HLD, HTN, PVD, stroke, and chronic systolic CHF with EF 79% who presented to the [facility] ED with complaints of shortness of breath.  He was originally evaluated in the ED for this problem 11/23 but saturations were stable and a CXR was negative at that time and he was able to be sent home.  He was not Covid tested at that time.  He returned 12/8 with the acute onset of severe shortness of breath and was found to have oxygen saturations in the mid 70s on room air in the ED.  CXR on this visit noted increased peribronchial thickening suggestive of possible acute bronchitis versus pulmonary edema but no pathopneumonic findings of Covid pneumonia.  He was found to be Covid positive.  He was transition from BiPAP on arrival to high flow nasal cannula in the [facility] ED.  Clinical Impression   Pt admitted with above diagnosis. PTA was living home with spouse, she also has recently been dx with COVID but was self isolating at home. Pt states that prior to this was independent but most recent just prior to hospitalization was needing some assistance from spouse with ADLs such as bathing. He states he used a cane at times to ambulate. Pt currently with functional limitations due to the deficits listed below (see PT Problem List). This am pt presents with deficits in strength, balance and coordination and also activity tolerance. He was able to get to EOB, sit unsupported for a few mins, stand and ambulate in room, steps are very short, shuffled and ataxic. Pt was on .5L/min via HFNC at rest and sating in 90s, with ambulation increased to 1L/min via HFNC and sats stayed in low 90s. Noted some coughing with flutter valve use. Pt will benefit from skilled PT to increase his overall strength, balance and  coordination, activity tolerance, independence and safety with mobility to allow discharge to the venue listed below.       Follow Up Recommendations SNF    Equipment Recommendations  None recommended by PT    Recommendations for Other Services       Precautions / Restrictions Precautions Precautions: Fall Restrictions Weight Bearing Restrictions: No      Mobility  Bed Mobility Overal bed mobility: Needs Assistance Bed Mobility: Supine to Sit     Supine to sit: Min assist        Transfers Overall transfer level: Needs assistance Equipment used: Rolling walker (2 wheeled) Transfers: Sit to/from [facility] Sit to Stand: Mod assist Stand pivot transfers: Mod assist          Ambulation/Gait Ambulation/Gait assistance: Mod assist Gait Distance (Feet): 32 Feet Assistive device: 1 person hand held assist Gait Pattern/deviations: Shuffle;Ataxic     General Gait Details: Pt on 1L/min via HFNC sats in low 90s with ambulation  Stairs            Wheelchair Mobility    Modified Rankin (Stroke Patients Only)       Balance Overall balance assessment: Needs assistance Sitting-balance support: Feet supported Sitting balance-Leahy Scale: Fair     Standing balance support: During functional activity;Single extremity supported Standing balance-Leahy Scale: Poor  Pertinent Vitals/Pain Pain Assessment: No/denies pain    Home Living Family/patient expects to be discharged to:: Private residence Living Arrangements: Spouse/significant other Available Help at Discharge: Family Type of Home: House Home Access: Stairs to enter   Technical brewer of Steps: 2 Home Layout: One level Home Equipment: Shower seat;Cane - single point;Walker - 4 wheels;Wheelchair - manual      Prior Function Level of Independence: Needs assistance;Independent with assistive device(s)   Gait / Transfers Assistance  Needed: Modified independent with a cane  ADL's / Homemaking Assistance Needed: Wife assists pt with showering and dressing. Pt modified independent with feeding, grooming, and toileting tasks.  Comments: Pt reports 2 falls in the last 6 months. Pt does not use O2 at home, but is currently on 0.5L. Pt does not drive.     Hand Dominance   Dominant Hand: Right    Extremity/Trunk Assessment   Upper Extremity Assessment Upper Extremity Assessment: Generalized weakness    Lower Extremity Assessment Lower Extremity Assessment: Generalized weakness    Cervical / Trunk Assessment Cervical / Trunk Assessment: Kyphotic  Communication   Communication: HOH  Cognition Arousal/Alertness: Awake/alert Behavior During Therapy: WFL for tasks assessed/performed Overall Cognitive Status: Within Functional Limits for tasks assessed                                 General Comments: Pt unsure of PLOF      General Comments General comments (skin integrity, edema, etc.): Pt on 0.5L [place] at start of session. Pt able to ambulate to/from bathroom on RA with O2 SATs decreasing to 86%. 0.5L O2 donned, noting quick return back to 90s. Educated pt on safety strategies, activity modifications, and energy conservation techniques for self-care and functional transfer tasks. Educated pt on breathing exercises and relaxation strategies for when anxiety presents.    Exercises Other Exercises Other Exercises: flutter valve x 15 wuth cues  Other Exercises: Flutter valve x 10 with min cues on technique Other Exercises: Pursed lip breathing with mod cues on technique.   Assessment/Plan    PT Assessment Patient needs continued PT services  PT Problem List Decreased strength;Decreased activity tolerance;Decreased balance;Decreased mobility;Decreased coordination;Decreased safety awareness       PT Treatment Interventions DME instruction;Gait training;Functional mobility training;Therapeutic  activities;Therapeutic exercise;Stair training;Balance training;Neuromuscular re-education;Patient/family education    PT Goals (Current goals can be found in the Care Plan section)  Acute Rehab PT Goals Patient Stated Goal: To go home Time For Goal Achievement: 10/27/19 Potential to Achieve Goals: Fair    Frequency Min 2X/week   Barriers to discharge   home alone with spouse who was recently also COVID +    Co-evaluation               AM-PAC PT "6 Clicks" Mobility  Outcome Measure Help needed turning from your back to your side while in a flat bed without using bedrails?: A Little Help needed moving from lying on your back to sitting on the side of a flat bed without using bedrails?: A Little Help needed moving to and from a bed to a chair (including a wheelchair)?: A Lot Help needed standing up from a chair using your arms (e.g., wheelchair or bedside chair)?: A Lot Help needed to walk in hospital room?: A Lot Help needed climbing 3-5 steps with a railing? : A Lot 6 Click Score: 14    End of Session Equipment Utilized During Treatment: Oxygen  Activity Tolerance: Patient limited by fatigue;Patient limited by lethargy;Treatment limited secondary to medical complications (Comment) Patient left: in chair;with call bell/phone within reach Nurse Communication: Mobility status PT Visit Diagnosis: Unsteadiness on feet (R26.81);Muscle weakness (generalized) (M62.81)    Time: 8446-5207 PT Time Calculation (min) (ACUTE ONLY): 24 min   Charges:   PT Evaluation $PT Eval Moderate Complexity: 1 Mod PT Treatments $Therapeutic Activity: 8-22 mins        Horald Chestnut, PT   Delford Field 10/13/2019, 4:15 PM

## 2019-10-13 NOTE — Plan of Care (Signed)
  Problem: Education: Goal: Knowledge of risk factors and measures for prevention of condition will improve Outcome: Progressing   Problem: Coping: Goal: Psychosocial and spiritual needs will be supported Outcome: Progressing   Problem: Respiratory: Goal: Will maintain a patent airway Outcome: Progressing Goal: Complications related to the disease process, condition or treatment will be avoided or minimized Outcome: Progressing   

## 2019-10-13 NOTE — Progress Notes (Signed)
PROGRESS NOTE                                                                                                                                                                                                             Patient Demographics:    [name], is a 73 y.o. male, DOB - October 03, 1946, SHF:[id number]  Outpatient Primary MD for the patient is [name], MD   Admit date - 10/11/2019   LOS - 2  No chief complaint on file.      Brief Narrative: Patient is a 73 y.o. male with PMHx of chronic systolic heart failure, COPD s/p CABG, PAD, CVA, COPD who presented to [facility] ED with shortness of breath-he was found to have acute hypoxic respiratory failure secondary to COVID-19 pneumonia and decompensated systolic heart failure.  See below for further details   Subjective:    [name] feels better-on just 1 L of oxygen this morning.  He does acknowledge significant clinical improvement-compared to how he first came into the hospital.   Assessment  & Plan :   Acute Hypoxic Resp Failure due to Covid 19 Viral pneumonia and decompensated systolic heart failure: Much improved-on just 1 L of oxygen (initially required 100% NRB).  He does not have any evidence of volume overload on exam.  Continue steroids/remdesivir-decrease diuretic dosing.  Echo on 12/9 showed EF around 35-40% (appears unchanged when compared to report from [facility]).  Fever: afebrile  O2 requirements:  SpO2: 98 % O2 Flow Rate (L/min): 2 L/min   COVID-19 Labs: Recent Labs    10/11/19 0021 10/11/19 0642 10/12/19 0057 10/13/19 0100  DDIMER  --   --  >20.00* 1.21*  FERRITIN 89  --  66 64  LDH 197*  --   --   --   CRP  --  2.5* 2.5* 1.2*       Component Value Date/Time   BNP 1,635.0 (H) 10/11/2019 0021    Recent Labs  Lab 10/11/19 0642 10/12/19 0057 10/13/19 0100  PROCALCITON 0.66 1.09 0.85    No results found for: SARSCOV2NAA   COVID-19  Medications: Steroids: 12/8>> Remdesivir: 12/7>>  Other medications: Antibiotics:Not needed as no evidence of bacterial infection  Prone/Incentive Spirometry: encouraged  incentive spirometry use 3-4/hour.  DVT Prophylaxis  :  Lovenox-at therapeutic dosing  Elevated D-dimer: His D-dimer is significantly  down today-suspect yesterday's lab values may be an error.  Remains on therapeutic Lovenox-await lower extremity Doppler.  Continue therapeutic Lovenox until Doppler results are back.  Continue to trend D-dimer.    CAD s/p CABG: No anginal symptoms-continue dual antiplatelet agents (outpatient regimen) along with statin/metoprolol  PAD/CVA: Appears stable-continue antiplatelet agents and statin.  DM-2 (A1c 8.9 on 12/9): CBGs relatively stable-continue Lantus 53 units daily, and SSI.  Follow and adjust.  CBG (last 3)  Recent Labs    10/12/19 1552 10/12/19 2028 10/13/19 0811  GLUCAP 162* 307* 101*   HTN: Controlled-continue metoprolol and Lasix.  COPD: Stable-continue bronchodilators.  Consults  :  None  Procedures  :  None  ABG:    Component Value Date/Time   PHART 7.36 10/11/2019 0329   PCO2ART 44 10/11/2019 0329   PO2ART 136 (H) 10/11/2019 0329   HCO3 24.9 10/11/2019 0329   TCO2 26 10/02/2016 1632   ACIDBASEDEF 0.8 10/11/2019 0329   O2SAT 99.0 10/11/2019 0329    Vent Settings: N/A  Condition -stable  Family Communication  :  Spouse updated over the phone on 12/10  Code Status :  Full Code  Diet :  Diet Order            Diet heart healthy/carb modified Room service appropriate? Yes; Fluid consistency: Thin  Diet effective now               Disposition Plan  :  Remain hospitalized  Barriers to discharge: Hypoxia requiring O2 supplementation/complete 5 days of IV Remdesivir  Antimicorbials  :    Anti-infectives (From admission, onward)   Start     Dose/Rate Route Frequency Ordered Stop   10/12/19 1000  remdesivir 100 mg in sodium chloride 0.9 %  100 mL IVPB  Status:  Discontinued     100 mg 200 mL/hr over 30 Minutes Intravenous Daily 10/11/19 1334 10/11/19 1342   10/12/19 1000  remdesivir 100 mg in sodium chloride 0.9 % 100 mL IVPB     100 mg 200 mL/hr over 30 Minutes Intravenous Daily 10/11/19 1342 10/16/19 0959   10/11/19 1345  remdesivir 200 mg in sodium chloride 0.9% 250 mL IVPB  Status:  Discontinued     200 mg 580 mL/hr over 30 Minutes Intravenous Once 10/11/19 1334 10/11/19 1342      Inpatient Medications  Scheduled Meds:  aspirin EC  81 mg Oral Daily   clopidogrel  75 mg Oral Daily   dexamethasone (DECADRON) injection  6 mg Intravenous Q24H   enoxaparin (LOVENOX) injection  80 mg Subcutaneous Q12H   furosemide  80 mg Intravenous Q12H   gabapentin  100 mg Oral BID   insulin aspart  0-5 Units Subcutaneous QHS   insulin aspart  0-9 Units Subcutaneous TID WC   insulin glargine  53 Units Subcutaneous QHS   magnesium oxide  200 mg Oral Daily   metoprolol tartrate  25 mg Oral BID   multivitamin  1 tablet Oral Daily   multivitamin with minerals  1 tablet Oral Daily   pantoprazole  40 mg Oral Daily   senna  1 tablet Oral BID   simvastatin  20 mg Oral QHS   vitamin C  500 mg Oral Daily   cholecalciferol  1,000 Units Oral Daily   zinc sulfate  220 mg Oral Daily   Continuous Infusions:  remdesivir 100 mg in NS 100 mL 100 mg (10/13/19 0800)   PRN Meds:.acetaminophen, albuterol, bisacodyl, chlorpheniramine-HYDROcodone, guaiFENesin-dextromethorphan, loperamide, ondansetron **OR** ondansetron (ZOFRAN)  IV, polyethylene glycol, traMADol   Time Spent in minutes  25   See all Orders from today for further details   Oren Binet M.D on 10/13/2019 at 10:37 AM  To page go to [email or web address] - use universal password  [facility] -  Office  [phone]    Objective:   Vitals:   10/13/19 0411 10/13/19 0500 10/13/19 0600 10/13/19 0743  BP:    (!) 149/59  Pulse: 60 61 (!) 53 70  Resp: 17 (!)  25 14 16   Temp: 97.9 F (36.6 C)   98.3 F (36.8 C)  TempSrc: Oral   Oral  SpO2: 99% 98% 96% 98%  Weight:  75 kg    Height:        Wt Readings from Last 3 Encounters:  10/13/19 75 kg  10/11/19 79.4 kg  09/26/19 79.4 kg     Intake/Output Summary (Last 24 hours) at 10/13/2019 1037 Last data filed at 10/13/2019 1022 Gross per 24 hour  Intake 1900 ml  Output 3252 ml  Net -1352 ml     Physical Exam Gen Exam:Alert awake-not in any distress HEENT:atraumatic, normocephalic Chest: B/L clear to auscultation anteriorly CVS:S1S2 regular Abdomen:soft non tender, non distended Extremities:no edema Neurology: Non focal Skin: no rash   Data Review:    CBC Recent Labs  Lab 10/11/19 0021 10/12/19 0057 10/13/19 0100  WBC 16.4* 10.7* 14.0*  HGB 16.3 15.0 14.9  HCT 51.8 48.5 47.3  PLT 323 232 308  MCV 91.0 92.4 90.6  MCH 28.6 28.6 28.5  MCHC 31.5 30.9 31.5  RDW 15.4 15.5 15.2  LYMPHSABS 3.6 0.7 0.8  MONOABS 0.9 0.4 0.6  EOSABS 0.2 0.0 0.0  BASOSABS 0.1 0.0 0.0    Chemistries  Recent Labs  Lab 10/11/19 0021 10/11/19 1950 10/12/19 0057 10/13/19 0100  NA 135  --  140 135  K 4.2  --  5.1 4.8  CL 99  --  100 93*  CO2 26  --  28 25  GLUCOSE 319*  --  89 161*  BUN 16  --  29* 43*  CREATININE 1.75*  --  1.81* 1.66*  CALCIUM 8.9  --  9.3 9.0  MG  --  2.4 2.5* 2.3  AST 50*  --  23 24  ALT 32  --  30 33  ALKPHOS 186*  --  135* 132*  BILITOT 0.7  --  0.6 0.5   ------------------------------------------------------------------------------------------------------------------ No results for input(s): CHOL, HDL, LDLCALC, TRIG, CHOLHDL, LDLDIRECT in the last 72 hours.  Lab Results  Component Value Date   HGBA1C 8.9 (H) 10/12/2019   ------------------------------------------------------------------------------------------------------------------ No results for input(s): TSH, T4TOTAL, T3FREE, THYROIDAB in the last 72 hours.  Invalid input(s):  FREET3 ------------------------------------------------------------------------------------------------------------------ Recent Labs    10/12/19 0057 10/13/19 0100  FERRITIN 66 64    Coagulation profile Recent Labs  Lab 10/11/19 0021  INR 0.9    Recent Labs    10/12/19 0057 10/13/19 0100  DDIMER >20.00* 1.21*    Cardiac Enzymes No results for input(s): CKMB, TROPONINI, MYOGLOBIN in the last 168 hours.  Invalid input(s): CK ------------------------------------------------------------------------------------------------------------------    Component Value Date/Time   BNP 1,635.0 (H) 10/11/2019 0021    Micro Results Recent Results (from the past 240 hour(s))  Culture, blood (routine x 2)     Status: None (Preliminary result)   Collection Time: 10/11/19 12:21 AM   Specimen: BLOOD  Result Value Ref Range Status   Specimen Description BLOOD RIGTH WRIST  Final  Special Requests   Final    BOTTLES DRAWN AEROBIC AND ANAEROBIC Blood Culture adequate volume   Culture   Final    NO GROWTH 2 DAYS Performed at [facility], [address] [place], [place] [address]    Report Status PENDING  Incomplete  Culture, blood (routine x 2)     Status: None (Preliminary result)   Collection Time: 10/11/19 12:21 AM   Specimen: BLOOD  Result Value Ref Range Status   Specimen Description BLOOD LEFT [facility]  Final   Special Requests   Final    BOTTLES DRAWN AEROBIC AND ANAEROBIC Blood Culture adequate volume   Culture   Final    NO GROWTH 2 DAYS Performed at [facility], [address], [place], [place] [address]    Report Status PENDING  Incomplete    Radiology Reports DG Chest 2 View  Result Date: 09/26/2019 CLINICAL DATA:  73 year old male with increasing shortness of breath. EXAM: CHEST - 2 VIEW COMPARISON:  Chest radiographs 06/27/2018 and earlier. FINDINGS: Evidence of left lung base fibrothorax on 2017 CT Abdomen and Pelvis. Stable lung volumes. Stable  cardiac size and mediastinal contours. Prior CABG. No pneumothorax or pulmonary edema. Chronic left lung base pleural scarring with no evidence of acute pleural effusion. No acute pulmonary opacity identified. No acute osseous abnormality identified. Negative visible bowel gas pattern. IMPRESSION: Chronic left lung base fibrothorax. No acute cardiopulmonary abnormality. Electronically Signed   By: Genevie Ann M.D.   On: 09/26/2019 05:04   DG Chest Port 1 View  Result Date: 10/12/2019 CLINICAL DATA:  73 year old with shortness of breath who is COVID-19 positive. EXAM: PORTABLE CHEST 1 VIEW COMPARISON:  10/11/2019 and earlier. FINDINGS: Prior sternotomy for CABG. Cardiac silhouette mildly enlarged, unchanged. Mild interstitial pulmonary edema present yesterday has resolved. Pulmonary vascularity now normal. Prominent bronchovascular markings diffusely and central peribronchial thickening, unchanged. No visible confluent or ground-glass airspace opacity currently. Chronic pleuroparenchymal scarring at the LEFT base. IMPRESSION: 1. Resolution of interstitial pulmonary edema since yesterday. 2. Stable COPD/emphysema. No acute cardiopulmonary disease. 3. Stable mild cardiomegaly without pulmonary edema. Electronically Signed   By: Evangeline Dakin M.D.   On: 10/12/2019 08:30   DG Chest Port 1 View  Result Date: 10/11/2019 CLINICAL DATA:  Shortness of breath. EXAM: PORTABLE CHEST 1 VIEW COMPARISON:  Radiograph 09/26/2019 FINDINGS: Post median sternotomy and CABG. Borderline cardiomegaly with unchanged mediastinal contours. Chronic hyperinflation. Chronic pleuroparenchymal scarring at the left lung base. Increased peribronchial thickening from prior exam, possible Kerley B-lines at the bases. No pneumothorax. No acute osseous abnormalities. IMPRESSION: 1. Increased peribronchial thickening from prior exam, may represent acute bronchitis versus pulmonary edema, with possible septal thickening/Kerley B-lines at the right  lung base 2. Chronic hyperinflation and pleuroparenchymal scarring at the left lung base. Electronically Signed   By: Keith Rake M.D.   On: 10/11/2019 01:21   ECHOCARDIOGRAM COMPLETE  Result Date: 10/12/2019   ECHOCARDIOGRAM REPORT   Patient Name:   CAROLS CLEMENCE Date of Exam: 10/12/2019 Medical Rec #:  [id number]  Height:       66.0 in Accession #:    [id number] Weight:       174.8 lb Date of Birth:  05/09/1946  BSA:          1.89 m Patient Age:    62 years   BP:           156/130 mmHg Patient Gender: M          HR:  61 bpm. Exam Location:  Inpatient Procedure: 2D Echo, Cardiac Doppler and Color Doppler Indications:    Acute respiratory failure.  History:        Patient has no prior history of Echocardiogram examinations.                 CHF, COPD; Risk Factors:Hypertension and Diabetes. Covid 19                 positive. Cancer.  Sonographer:    Roseanna Rainbow RDCS Referring Phys: [place]  Sonographer Comments: Technically difficult study due to poor echo windows and suboptimal apical window. IMPRESSIONS  1. Left ventricular ejection fraction, by visual estimation, is 35 to 40%. The left ventricle has moderately decreased function. There is moderately increased left ventricular hypertrophy.  2. Abnormal septal motion consistent with post-operative status.  3. Indeterminate diastolic filling due to E-A fusion.  4. The left ventricle demonstrates global hypokinesis.  5. Global right ventricle has moderately reduced systolic function.The right ventricular size is mildly enlarged. No increase in right ventricular wall thickness.  6. Left atrial size was normal.  7. Right atrial size was normal.  8. Mild mitral annular calcification.  9. The mitral valve is degenerative. Mild mitral valve regurgitation. 10. The tricuspid valve is grossly normal. Tricuspid valve regurgitation is trivial. 11. The aortic valve has an indeterminant number of cusps. Aortic valve regurgitation is mild. Mild aortic  valve stenosis. 12. Mild aortic stenosis. Suspect functional fusion of the RCC/LCC. Likely tricuspid. 13. The pulmonic valve was grossly normal. Pulmonic valve regurgitation is not visualized. 14. TR signal is inadequate for assessing pulmonary artery systolic pressure. 15. The inferior vena cava is dilated in size with <50% respiratory variability, suggesting right atrial pressure of 15 mmHg. 16. A prior study was performed on 02/16/2019. 17. EF is 35-40%. Prior study at [facility] compared via care everywhere. EF noted to be 40% during that study. Appears unchanged when compared with the report. Direct images from [facility] unable to be viewed. 18. No change from prior study. FINDINGS  Left Ventricle: Left ventricular ejection fraction, by visual estimation, is 35 to 40%. The left ventricle has moderately decreased function. The left ventricle demonstrates global hypokinesis. There is moderately increased left ventricular hypertrophy.  Concentric left ventricular hypertrophy. Abnormal (paradoxical) septal motion consistent with post-operative status. Indeterminate diastolic filling due to E-A fusion. Right Ventricle: The right ventricular size is mildly enlarged. No increase in right ventricular wall thickness. Global RV systolic function is has moderately reduced systolic function. Left Atrium: Left atrial size was normal in size. Right Atrium: Right atrial size was normal in size Pericardium: There is no evidence of pericardial effusion. Mitral Valve: The mitral valve is degenerative in appearance. Mild mitral annular calcification. Mild mitral valve regurgitation. MV peak gradient, 100.0 mmHg. Tricuspid Valve: The tricuspid valve is grossly normal. Tricuspid valve regurgitation is trivial. Aortic Valve: The aortic valve has an indeterminant number of cusps. Aortic valve regurgitation is mild. Mild aortic stenosis is present. Aortic valve mean gradient measures 8.0 mmHg. Aortic valve peak gradient measures 14.1 mmHg. Mild  aortic stenosis. Suspect functional fusion of the RCC/LCC. Likely tricuspid. Pulmonic Valve: The pulmonic valve was grossly normal. Pulmonic valve regurgitation is not visualized. Pulmonic regurgitation is not visualized. Aorta: The aortic root is normal in size and structure. Venous: The inferior vena cava is dilated in size with less than 50% respiratory variability, suggesting right atrial pressure of 15 mmHg. IAS/Shunts: No atrial level shunt detected  by color flow Doppler. Additional Comments: A prior study was performed on 02/16/2019.  LEFT VENTRICLE PLAX 2D LVIDd:         4.62 cm       Diastology LVIDs:         3.73 cm       LV e' lateral:   7.07 cm/s LV PW:         1.53 cm       LV E/e' lateral: 10.5 LV IVS:        2.37 cm       LV e' medial:    5.88 cm/s LV SV:         39 ml         LV E/e' medial:  12.6 LV SV Index:   20.12  LV Volumes (MOD) LV area d, A2C:    34.50 cm LV area s, A2C:    30.70 cm LV major d, A2C:   7.08 cm LV major s, A2C:   7.79 cm LV vol d, MOD A2C: 143.0 ml LV vol s, MOD A2C: 106.0 ml LV SV MOD A2C:     37.0 ml RIGHT VENTRICLE            IVC RV S prime:     5.33 cm/s  IVC diam: 2.43 cm TAPSE (M-mode): 1.1 cm LEFT ATRIUM              Index       RIGHT ATRIUM           Index LA diam:        5.20 cm  2.75 cm/m  RA Area:     16.40 cm LA Vol (A2C):   107.0 ml 56.65 ml/m RA Volume:   48.80 ml  25.84 ml/m LA Vol (A4C):   60.5 ml  32.03 ml/m LA Biplane Vol: 81.6 ml  43.21 ml/m  AORTIC VALVE AV Vmax:           187.50 cm/s AV Vmean:          136.000 cm/s AV VTI:            0.432 m AV Peak Grad:      14.1 mmHg AV Mean Grad:      8.0 mmHg LVOT Vmax:         93.10 cm/s LVOT Vmean:        62.200 cm/s LVOT VTI:          0.198 m LVOT/AV VTI ratio: 0.46  AORTA Ao Root diam: 3.30 cm Ao Asc diam:  3.30 cm MITRAL VALVE MV Area (PHT): 4.44 cm             SHUNTS MV Peak grad:  100.0 mmHg           Systemic VTI: 0.20 m MV Mean grad:  74.0 mmHg MV Vmax:       5.00 m/s MV Vmean:      413.0 cm/s MV VTI:         1.67 m MV PHT:        49.59 msec MV Decel Time: 171 msec MR PISA:        1.57 cm MR PISA Radius: 0.50 cm MV E velocity: 73.90 cm/s 103 cm/s MV A velocity: 63.10 cm/s 70.3 cm/s MV E/A ratio:  1.17       1.5  Eleonore Chiquito MD Electronically signed by Eleonore Chiquito MD Signature Date/Time: 10/12/2019/5:14:09 PM    Final

## 2019-10-13 NOTE — Progress Notes (Signed)
Pt had 13 beats of VT, asymptomatic, sitting in chair. MD paged via [facility] text chat.

## 2019-10-13 NOTE — Progress Notes (Signed)
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  He was originally evaluated in the ED for this problem 11/23 but saturations were stable and a CXR was negative at that time and he was able to be sent home.  He was not Covid tested at that time.  He returned 12/8 with the acute onset of severe shortness of breath and was found to have oxygen saturations in the mid 70s on room air in the ED.  CXR on this visit noted increased peribronchial thickening suggestive of possible acute bronchitis versus pulmonary edema but no pathopneumonic findings of Covid pneumonia.  He was found to be Covid positive.  He was transition from BiPAP on arrival to high flow nasal cannula in the [facility] ED.   Clinical Impression   PTA pt was living with his wife, independent with mobility with a cane. Pt reports that his wife assists him with bathing and dressing tasks, however he is independent with toileting, grooming, and self-feeding tasks. Pt does not use O2 at home and is currently on 0.5L [place]. Pt currently requires setup to mod assist for self-care and functional transfer tasks. Pt able to ambulate to/from bathroom on RA with RW and variable min guard to min assist. Noted 0 instances of LOB, however pt unsteady on his feet. SpO2 decreased to 86% on RA following toileting and mobility task. Pt reporting increased SOB and requesting to don O2. Educated pt on pursed lip breathing and relaxation strategies. 0.5L O2 donned with quick return back to 90s. 2/4 DOE. Pt demonstrates decreased strength, endurance, balance, standing tolerance, and activity tolerance impacting ability to complete self-care and functional transfer tasks.  Recommend skilled OT services to address above deficits in order to promote function and prevent further decline. Recommend SNF placement for additional rehab prior to discharge home.     Follow Up Recommendations  SNF    Equipment Recommendations  Other (comment)(TBD at next venue of care)    Recommendations for Other Services       Precautions / Restrictions Precautions Precautions: Fall Restrictions Weight Bearing Restrictions: No      Mobility Bed Mobility                  Transfers Overall transfer level: Needs assistance Equipment used: Rolling walker (2 wheeled) Transfers: Sit to/from Stand Sit to Stand: Min guard              Balance Overall balance assessment: Needs assistance   Sitting balance-Leahy Scale: Good       Standing balance-Leahy Scale: Poor                             ADL either performed or assessed with clinical judgement   ADL Overall ADL's : Needs assistance/impaired Eating/Feeding: Set up;Sitting   Grooming: Sitting;Set up   Upper Body Bathing: Minimal assistance;Sitting   Lower Body Bathing: Moderate assistance;Sitting/lateral leans;Sit to/from stand   Upper Body Dressing : Minimal assistance;Sitting   Lower Body Dressing: Minimal assistance;Sit to/from stand;Sitting/lateral leans   Toilet Transfer: Min guard;Ambulation;Grab bars;Regular Toilet   Toileting- Clothing Manipulation and Hygiene: Minimal assistance;Sit to/from stand  Functional mobility during ADLs: Min guard;Minimal assistance;Rolling walker General ADL Comments: Pt able to ambulate to/from bathroom with RW and variable min guard to min assist. Noted 0 instances of LOB, however pt unsteady on feet.     Vision Baseline Vision/History: Wears glasses Wears Glasses: Reading only       Perception     Praxis      Pertinent Vitals/Pain Pain Assessment: No/denies pain     Hand Dominance Right   Extremity/Trunk Assessment Upper  Extremity Assessment Upper Extremity Assessment: Generalized weakness   Lower Extremity Assessment Lower Extremity Assessment: Defer to PT evaluation       Communication Communication Communication: HOH   Cognition Arousal/Alertness: Awake/alert Behavior During Therapy: WFL for tasks assessed/performed Overall Cognitive Status: No family/caregiver present to determine baseline cognitive functioning(Pt appears slightly confused. )                                 General Comments: Pt unsure of PLOF   General Comments  Pt on 0.5L [place] at start of session. Pt able to ambulate to/from bathroom on RA with O2 SATs decreasing to 86%. 0.5L O2 donned, noting quick return back to 90s. Educated pt on safety strategies, activity modifications, and energy conservation techniques for self-care and functional transfer tasks. Educated pt on breathing exercises and relaxation strategies for when anxiety presents.    Exercises Exercises: Other exercises Other Exercises Other Exercises: Incentive spirometer x 10 with min cues on technique. Pulling 1541mL Other Exercises: Flutter valve x 10 with min cues on technique Other Exercises: Pursed lip breathing with mod cues on technique.   Shoulder Instructions      Home Living Family/patient expects to be discharged to:: Private residence Living Arrangements: Spouse/significant other Available Help at Discharge: Family Type of Home: House Home Access: Stairs to enter Technical brewer of Steps: 2   Home Layout: One level     Bathroom Shower/Tub: Teacher, early years/pre: [place]: Shower seat;Cane - single point;Walker - 4 wheels;Wheelchair - manual          Prior Functioning/Environment Level of Independence: Needs assistance;Independent with assistive device(s)  Gait / Transfers Assistance Needed: Modified independent with a cane ADL's / Homemaking Assistance Needed: Wife assists pt with  showering and dressing. Pt modified independent with feeding, grooming, and toileting tasks.   Comments: Pt reports 2 falls in the last 6 months. Pt does not use O2 at home, but is currently on 0.5L. Pt does not drive.        OT Problem List: Decreased strength;Decreased activity tolerance;Impaired balance (sitting and/or standing);Cardiopulmonary status limiting activity      OT Treatment/Interventions: Self-care/ADL training;Therapeutic exercise;Neuromuscular education;Energy conservation;DME and/or AE instruction;Therapeutic activities;Patient/family education;Balance training    OT Goals(Current goals can be found in the care plan section) Acute Rehab OT Goals Patient Stated Goal: To go home Time For Goal Achievement: 10/27/19 Potential to Achieve Goals: Good ADL Goals Pt Will Perform Grooming: with modified independence;standing Pt Will Transfer to Toilet: with modified independence;ambulating;regular height toilet Pt Will Perform Toileting - Clothing Manipulation and hygiene: with modified independence;sit to/from stand Additional ADL Goal #1: Pt to recall and verbalize 3 fall prevention strategies with 0 verbal cues. Additional ADL Goal #2: Pt to recall and demonstrate breathing exercises with 0 verbal cues on technique.  OT Frequency: Min 2X/week   Barriers to D/C:  Co-evaluation              AM-PAC OT "6 Clicks" Daily Activity     Outcome Measure Help from another person eating meals?: A Little Help from another person taking care of personal grooming?: A Little Help from another person toileting, which includes using toliet, bedpan, or urinal?: A Little Help from another person bathing (including washing, rinsing, drying)?: A Lot Help from another person to put on and taking off regular upper body clothing?: A Little Help from another person to put on and taking off regular lower body clothing?: A Little 6 Click Score: 17   End of Session Equipment  Utilized During Treatment: Rolling walker;Oxygen;Gait belt Nurse Communication: Mobility status  Activity Tolerance: Patient limited by fatigue(Limited by SOB) Patient left: in chair;with call bell/phone within reach  OT Visit Diagnosis: Unsteadiness on feet (R26.81);Muscle weakness (generalized) (M62.81)                Time: 6979-4801 OT Time Calculation (min): 48 min Charges:  OT General Charges $OT Visit: 1 Visit OT Evaluation $OT Eval Moderate Complexity: 1 Mod OT Treatments $Self Care/Home Management : 8-22 mins $Therapeutic Exercise: 8-22 mins  Mauri Brooklyn OTR/L [phone]   Mauri Brooklyn 10/13/2019, 4:02 PM

## 2019-10-13 NOTE — Progress Notes (Signed)
Lower extremity venous doppler has been completed, results will be located under CV Proc.  Darlina Sicilian RDCS

## 2019-10-14 ENCOUNTER — Other Ambulatory Visit: Payer: Self-pay

## 2019-10-14 LAB — CBC WITH DIFFERENTIAL/PLATELET
Abs Immature Granulocytes: 0.04 10*3/uL (ref 0.00–0.07)
Basophils Absolute: 0 10*3/uL (ref 0.0–0.1)
Basophils Relative: 0 %
Eosinophils Absolute: 0 10*3/uL (ref 0.0–0.5)
Eosinophils Relative: 0 %
HCT: 49.2 % (ref 39.0–52.0)
Hemoglobin: 15.8 g/dL (ref 13.0–17.0)
Immature Granulocytes: 0 %
Lymphocytes Relative: 8 %
Lymphs Abs: 0.7 10*3/uL (ref 0.7–4.0)
MCH: 28.5 pg (ref 26.0–34.0)
MCHC: 32.1 g/dL (ref 30.0–36.0)
MCV: 88.8 fL (ref 80.0–100.0)
Monocytes Absolute: 0.4 10*3/uL (ref 0.1–1.0)
Monocytes Relative: 5 %
Neutro Abs: 7.9 10*3/uL — ABNORMAL HIGH (ref 1.7–7.7)
Neutrophils Relative %: 87 %
Platelets: 298 10*3/uL (ref 150–400)
RBC: 5.54 MIL/uL (ref 4.22–5.81)
RDW: 15 % (ref 11.5–15.5)
WBC: 9 10*3/uL (ref 4.0–10.5)
nRBC: 0 % (ref 0.0–0.2)

## 2019-10-14 LAB — COMPREHENSIVE METABOLIC PANEL
ALT: 40 U/L (ref 0–44)
AST: 22 U/L (ref 15–41)
Albumin: 3.3 g/dL — ABNORMAL LOW (ref 3.5–5.0)
Alkaline Phosphatase: 134 U/L — ABNORMAL HIGH (ref 38–126)
Anion gap: 14 (ref 5–15)
BUN: 48 mg/dL — ABNORMAL HIGH (ref 8–23)
CO2: 29 mmol/L (ref 22–32)
Calcium: 8.8 mg/dL — ABNORMAL LOW (ref 8.9–10.3)
Chloride: 92 mmol/L — ABNORMAL LOW (ref 98–111)
Creatinine, Ser: 1.86 mg/dL — ABNORMAL HIGH (ref 0.61–1.24)
GFR calc Af Amer: 40 mL/min — ABNORMAL LOW (ref >60)
GFR calc non Af Amer: 35 mL/min — ABNORMAL LOW (ref >60)
Glucose, Bld: 145 mg/dL — ABNORMAL HIGH (ref 70–99)
Potassium: 4.2 mmol/L (ref 3.5–5.1)
Sodium: 135 mmol/L (ref 135–145)
Total Bilirubin: 0.5 mg/dL (ref 0.3–1.2)
Total Protein: 6.7 g/dL (ref 6.5–8.1)

## 2019-10-14 LAB — GLUCOSE, CAPILLARY
Glucose-Capillary: 61 mg/dL — ABNORMAL LOW (ref 70–99)
Glucose-Capillary: 136 mg/dL — ABNORMAL HIGH (ref 70–99)
Glucose-Capillary: 211 mg/dL — ABNORMAL HIGH (ref 70–99)
Glucose-Capillary: 233 mg/dL — ABNORMAL HIGH (ref 70–99)

## 2019-10-14 LAB — D-DIMER, QUANTITATIVE: D-Dimer, Quant: 0.49 ug/mL-FEU (ref 0.00–0.50)

## 2019-10-14 LAB — FERRITIN: Ferritin: 62 ng/mL (ref 24–336)

## 2019-10-14 LAB — C-REACTIVE PROTEIN: CRP: 1 mg/dL — ABNORMAL HIGH (ref <1.0)

## 2019-10-14 LAB — MAGNESIUM: Magnesium: 2.3 mg/dL (ref 1.7–2.4)

## 2019-10-14 MED ORDER — ENOXAPARIN SODIUM 40 MG/0.4ML ~~LOC~~ SOLN
40.0000 mg | SUBCUTANEOUS | Status: DC
  Administered 2019-10-14: 40 mg via SUBCUTANEOUS
  Filled 2019-10-14: qty 0.4

## 2019-10-14 MED ORDER — FUROSEMIDE 20 MG PO TABS
40.0000 mg | ORAL_TABLET | Freq: Two times a day (BID) | ORAL | Status: DC
  Administered 2019-10-14 – 2019-10-15 (×3): 40 mg via ORAL
  Filled 2019-10-14 (×3): qty 2

## 2019-10-14 MED ORDER — METOPROLOL TARTRATE 50 MG PO TABS: 50.0000 mg | ORAL_TABLET | Freq: Two times a day (BID) | ORAL | Status: DC

## 2019-10-14 MED ORDER — HYDRALAZINE HCL 25 MG PO TABS
25.0000 mg | ORAL_TABLET | Freq: Three times a day (TID) | ORAL | Status: DC
  Administered 2019-10-14 – 2019-10-15 (×4): 25 mg via ORAL
  Filled 2019-10-14 (×2): qty 1
  Filled 2019-10-14: qty 0.5
  Filled 2019-10-14 (×2): qty 1

## 2019-10-14 MED ORDER — ISOSORBIDE MONONITRATE ER 30 MG PO TB24
30.0000 mg | ORAL_TABLET | Freq: Every day | ORAL | Status: DC
  Administered 2019-10-14 – 2019-10-15 (×2): 30 mg via ORAL
  Filled 2019-10-14 (×2): qty 1

## 2019-10-14 NOTE — TOC Progression Note (Signed)
Transition of Care [facility]) - Progression Note    Patient Details  Name: [name] MRN: [id number] Date of Birth: [birth year]  Transition of Care [facility]) CM/SW Contact  Eileen Stanford, LCSW Phone Number: 10/14/2019, 2:04 PM  Clinical Narrative:   [facility] is the only facility that has Covid beds avaliable. Pt is not happy but is ok with it. Auth started.    Expected Discharge Plan: [place] Barriers to Discharge: Continued Medical Work up  Expected Discharge Plan and Services Expected Discharge Plan: [place] In-house Referral: NA   Post Acute Care Choice: [place] Living arrangements for the past 2 months: Single Family Home                           HH Arranged: NA           Social Determinants of Health (SDOH) Interventions    Readmission Risk Interventions No flowsheet data found.

## 2019-10-14 NOTE — Progress Notes (Signed)
PROGRESS NOTE                                                                                                                                                                                                             Patient Demographics:    [name], is a 73 y.o. male, DOB - February 11, 1946, HFW:[id number]  Outpatient Primary MD for the patient is [name], MD   Admit date - 10/11/2019   LOS - 3  No chief complaint on file.      Brief Narrative: Patient is a 73 y.o. male with PMHx of chronic systolic heart failure, COPD s/p CABG, PAD, CVA, COPD who presented to [facility] ED with shortness of breath-he was found to have acute hypoxic respiratory failure secondary to COVID-19 pneumonia and decompensated systolic heart failure.  See below for further details   Subjective:    [name] feels better-he is now on room air.   Assessment  & Plan :   Acute Hypoxic Resp Failure due to Covid 19 Viral pneumonia and decompensated systolic heart failure ( EF 35-40% by TTE on 12/9): Significantly improved with diuresis, remdesivir and steroids.  Suspect he had more of decompensated heart failure than Covid pneumonitis.  He has responded very well to diuretics.  He is on room air.  He has no signs of volume overload on exam.  Switch to oral Lasix-continue steroids and remdesivir.    Fever: afebrile  O2 requirements:  SpO2: 94 % O2 Flow Rate (L/min): 0.5 L/min   COVID-19 Labs: Recent Labs    10/12/19 0057 10/13/19 0100 10/14/19 0020  DDIMER >20.00* 1.21* 0.49  FERRITIN 66 64 62  CRP 2.5* 1.2* 1.0*       Component Value Date/Time   BNP 1,635.0 (H) 10/11/2019 0021    Recent Labs  Lab 10/11/19 0642 10/12/19 0057 10/13/19 0100  PROCALCITON 0.66 1.09 0.85    No results found for: SARSCOV2NAA   COVID-19 Medications: Steroids: 12/8>> Remdesivir: 12/7>>  Other medications: Antibiotics:Not needed as no evidence of  bacterial infection  Prone/Incentive Spirometry: encouraged  incentive spirometry use 3-4/hour.  DVT Prophylaxis  :  Lovenox-at therapeutic dosing  Elevated D-dimer: D-dimer has significantly improved-lower extremity Doppler negative.  Suspect we can change him back to prophylactic dose of Lovenox.  Suspect that elevated D-dimer could have been a lab error.  Type I second-degree heart block: Telemetry strips reviewed overnight-appears to have had a episode of type I second-degree heart block-and some episodes of NSVT/PVCs.  Beta-blocker has been discontinued.  Case was discussed with Dr. Radford Pax who reviewed his chart-and advise continuing to hold beta-blocker.  CAD s/p CABG: No anginal symptoms-continue dual antiplatelet agents (outpatient regimen) along with statin/metoprolol  PAD/CVA: Appears stable-continue antiplatelet agents and statin.  DM-2 (A1c 8.9 on 12/9): CBGs relatively stable-continue Lantus 53 units daily, and SSI.  Follow and adjust.  CBG (last 3)  Recent Labs    10/14/19 0724 10/14/19 0820 10/14/19 1138  GLUCAP 61* 136* 211*   HTN: Controlled-continue metoprolol and Lasix.  COPD: Stable-continue bronchodilators.  Consults  :  None  Procedures  :  None  ABG:    Component Value Date/Time   PHART 7.36 10/11/2019 0329   PCO2ART 44 10/11/2019 0329   PO2ART 136 (H) 10/11/2019 0329   HCO3 24.9 10/11/2019 0329   TCO2 26 10/02/2016 1632   ACIDBASEDEF 0.8 10/11/2019 0329   O2SAT 99.0 10/11/2019 0329    Vent Settings: N/A  Condition -stable  Family Communication  :  Spouse updated over the phone on 12/11  Code Status :  Full Code  Diet :  Diet Order            Diet heart healthy/carb modified Room service appropriate? Yes; Fluid consistency: Thin  Diet effective now               Disposition Plan  :  Remain hospitalized-SNF on discharge  Barriers to discharge: Hypoxia requiring O2 supplementation/complete 5 days of IV Remdesivir  Antimicorbials   :    Anti-infectives (From admission, onward)   Start     Dose/Rate Route Frequency Ordered Stop   10/12/19 1000  remdesivir 100 mg in sodium chloride 0.9 % 100 mL IVPB  Status:  Discontinued     100 mg 200 mL/hr over 30 Minutes Intravenous Daily 10/11/19 1334 10/11/19 1342   10/12/19 1000  remdesivir 100 mg in sodium chloride 0.9 % 100 mL IVPB     100 mg 200 mL/hr over 30 Minutes Intravenous Daily 10/11/19 1342 10/16/19 0959   10/11/19 1345  remdesivir 200 mg in sodium chloride 0.9% 250 mL IVPB  Status:  Discontinued     200 mg 580 mL/hr over 30 Minutes Intravenous Once 10/11/19 1334 10/11/19 1342      Inpatient Medications  Scheduled Meds: . aspirin EC  81 mg Oral Daily  . clopidogrel  75 mg Oral Daily  . dexamethasone (DECADRON) injection  6 mg Intravenous Q24H  . enoxaparin (LOVENOX) injection  40 mg Subcutaneous Q24H  . furosemide  40 mg Oral BID  . gabapentin  100 mg Oral BID  . hydrALAZINE  25 mg Oral Q8H  . insulin aspart  0-5 Units Subcutaneous QHS  . insulin aspart  0-9 Units Subcutaneous TID WC  . insulin glargine  53 Units Subcutaneous QHS  . isosorbide mononitrate  30 mg Oral Daily  . magnesium oxide  200 mg Oral Daily  . multivitamin  1 tablet Oral Daily  . multivitamin with minerals  1 tablet Oral Daily  . pantoprazole  40 mg Oral Daily  . senna  1 tablet Oral BID  . simvastatin  20 mg Oral QHS  . vitamin C  500 mg Oral Daily  . cholecalciferol  1,000 Units Oral Daily  . zinc sulfate  220 mg Oral Daily   Continuous Infusions: . remdesivir 100  mg in NS 100 mL 100 mg (10/14/19 0834)   PRN Meds:.acetaminophen, albuterol, bisacodyl, chlorpheniramine-HYDROcodone, guaiFENesin-dextromethorphan, loperamide, ondansetron **OR** ondansetron (ZOFRAN) IV, polyethylene glycol, traMADol   Time Spent in minutes  25   See all Orders from today for further details   Oren Binet M.D on 10/14/2019 at 1:20 PM  To page go to [email or web address] - use universal  password  [facility] -  Office  [phone]    Objective:   Vitals:   10/13/19 2036 10/14/19 0500 10/14/19 0716 10/14/19 1311  BP: (!) 153/64 (!) 158/69 (!) 178/67 (!) 152/69  Pulse: 62 (!) 52 (!) 58 61  Resp: 17 18 19 18   Temp: 97.6 F (36.4 C) 97.6 F (36.4 C) 97.8 F (36.6 C) 97.7 F (36.5 C)  TempSrc: Oral Oral Oral Oral  SpO2:  97% 94% 94%  Weight:  73.9 kg    Height:        Wt Readings from Last 3 Encounters:  10/14/19 73.9 kg  10/11/19 79.4 kg  09/26/19 79.4 kg     Intake/Output Summary (Last 24 hours) at 10/14/2019 1320 Last data filed at 10/14/2019 1000 Gross per 24 hour  Intake --  Output 2550 ml  Net -2550 ml     Physical Exam Gen Exam:Alert awake-not in any distress HEENT:atraumatic, normocephalic Chest: B/L clear to auscultation anteriorly CVS:S1S2 regular Abdomen:soft non tender, non distended Extremities:no edema Neurology: Non focal Skin: no rash   Data Review:    CBC Recent Labs  Lab 10/11/19 0021 10/12/19 0057 10/13/19 0100 10/14/19 0020  WBC 16.4* 10.7* 14.0* 9.0  HGB 16.3 15.0 14.9 15.8  HCT 51.8 48.5 47.3 49.2  PLT 323 232 308 298  MCV 91.0 92.4 90.6 88.8  MCH 28.6 28.6 28.5 28.5  MCHC 31.5 30.9 31.5 32.1  RDW 15.4 15.5 15.2 15.0  LYMPHSABS 3.6 0.7 0.8 0.7  MONOABS 0.9 0.4 0.6 0.4  EOSABS 0.2 0.0 0.0 0.0  BASOSABS 0.1 0.0 0.0 0.0    Chemistries  Recent Labs  Lab 10/11/19 0021 10/11/19 1950 10/12/19 0057 10/13/19 0100 10/14/19 0020  NA 135  --  140 135 135  K 4.2  --  5.1 4.8 4.2  CL 99  --  100 93* 92*  CO2 26  --  28 25 29   GLUCOSE 319*  --  89 161* 145*  BUN 16  --  29* 43* 48*  CREATININE 1.75*  --  1.81* 1.66* 1.86*  CALCIUM 8.9  --  9.3 9.0 8.8*  MG  --  2.4 2.5* 2.3 2.3  AST 50*  --  23 24 22   ALT 32  --  30 33 40  ALKPHOS 186*  --  135* 132* 134*  BILITOT 0.7  --  0.6 0.5 0.5    ------------------------------------------------------------------------------------------------------------------ No results for input(s): CHOL, HDL, LDLCALC, TRIG, CHOLHDL, LDLDIRECT in the last 72 hours.  Lab Results  Component Value Date   HGBA1C 8.9 (H) 10/12/2019   ------------------------------------------------------------------------------------------------------------------ No results for input(s): TSH, T4TOTAL, T3FREE, THYROIDAB in the last 72 hours.  Invalid input(s): FREET3 ------------------------------------------------------------------------------------------------------------------ Recent Labs    10/13/19 0100 10/14/19 0020  FERRITIN 64 62    Coagulation profile Recent Labs  Lab 10/11/19 0021  INR 0.9    Recent Labs    10/13/19 0100 10/14/19 0020  DDIMER 1.21* 0.49    Cardiac Enzymes No results for input(s): CKMB, TROPONINI, MYOGLOBIN in the last 168 hours.  Invalid input(s): CK ------------------------------------------------------------------------------------------------------------------    Component Value Date/Time  BNP 1,635.0 (H) 10/11/2019 0021    Micro Results Recent Results (from the past 240 hour(s))  Culture, blood (routine x 2)     Status: None (Preliminary result)   Collection Time: 10/11/19 12:21 AM   Specimen: BLOOD  Result Value Ref Range Status   Specimen Description BLOOD RIGTH WRIST  Final   Special Requests   Final    BOTTLES DRAWN AEROBIC AND ANAEROBIC Blood Culture adequate volume   Culture   Final    NO GROWTH 3 DAYS Performed at [facility], [address], [place], [place] [address]    Report Status PENDING  Incomplete  Culture, blood (routine x 2)     Status: None (Preliminary result)   Collection Time: 10/11/19 12:21 AM   Specimen: BLOOD  Result Value Ref Range Status   Specimen Description BLOOD LEFT [facility]  Final   Special Requests   Final    BOTTLES DRAWN AEROBIC AND ANAEROBIC Blood Culture  adequate volume   Culture   Final    NO GROWTH 3 DAYS Performed at [facility], [address] [place], [place] [address]    Report Status PENDING  Incomplete    Radiology Reports DG Chest 2 View  Result Date: 09/26/2019 CLINICAL DATA:  73 year old male with increasing shortness of breath. EXAM: CHEST - 2 VIEW COMPARISON:  Chest radiographs 06/27/2018 and earlier. FINDINGS: Evidence of left lung base fibrothorax on 2017 CT Abdomen and Pelvis. Stable lung volumes. Stable cardiac size and mediastinal contours. Prior CABG. No pneumothorax or pulmonary edema. Chronic left lung base pleural scarring with no evidence of acute pleural effusion. No acute pulmonary opacity identified. No acute osseous abnormality identified. Negative visible bowel gas pattern. IMPRESSION: Chronic left lung base fibrothorax. No acute cardiopulmonary abnormality. Electronically Signed   By: Genevie Ann M.D.   On: 09/26/2019 05:04   DG Chest Port 1 View  Result Date: 10/12/2019 CLINICAL DATA:  73 year old with shortness of breath who is COVID-19 positive. EXAM: PORTABLE CHEST 1 VIEW COMPARISON:  10/11/2019 and earlier. FINDINGS: Prior sternotomy for CABG. Cardiac silhouette mildly enlarged, unchanged. Mild interstitial pulmonary edema present yesterday has resolved. Pulmonary vascularity now normal. Prominent bronchovascular markings diffusely and central peribronchial thickening, unchanged. No visible confluent or ground-glass airspace opacity currently. Chronic pleuroparenchymal scarring at the LEFT base. IMPRESSION: 1. Resolution of interstitial pulmonary edema since yesterday. 2. Stable COPD/emphysema. No acute cardiopulmonary disease. 3. Stable mild cardiomegaly without pulmonary edema. Electronically Signed   By: Evangeline Dakin M.D.   On: 10/12/2019 08:30   DG Chest Port 1 View  Result Date: 10/11/2019 CLINICAL DATA:  Shortness of breath. EXAM: PORTABLE CHEST 1 VIEW COMPARISON:  Radiograph 09/26/2019  FINDINGS: Post median sternotomy and CABG. Borderline cardiomegaly with unchanged mediastinal contours. Chronic hyperinflation. Chronic pleuroparenchymal scarring at the left lung base. Increased peribronchial thickening from prior exam, possible Kerley B-lines at the bases. No pneumothorax. No acute osseous abnormalities. IMPRESSION: 1. Increased peribronchial thickening from prior exam, may represent acute bronchitis versus pulmonary edema, with possible septal thickening/Kerley B-lines at the right lung base 2. Chronic hyperinflation and pleuroparenchymal scarring at the left lung base. Electronically Signed   By: Keith Rake M.D.   On: 10/11/2019 01:21   ECHOCARDIOGRAM COMPLETE  Result Date: 10/12/2019   ECHOCARDIOGRAM REPORT   Patient Name:   LEODIS ALCOCER Date of Exam: 10/12/2019 Medical Rec #:  [id number]  Height:       66.0 in Accession #:    [id number] Weight:  174.8 lb Date of Birth:  [birth year]  BSA:          1.89 m Patient Age:    89 years   BP:           156/130 mmHg Patient Gender: M          HR:           61 bpm. Exam Location:  Inpatient Procedure: 2D Echo, Cardiac Doppler and Color Doppler Indications:    Acute respiratory failure.  History:        Patient has no prior history of Echocardiogram examinations.                 CHF, COPD; Risk Factors:Hypertension and Diabetes. Covid 19                 positive. Cancer.  Sonographer:    Roseanna Rainbow RDCS Referring Phys: [place]  Sonographer Comments: Technically difficult study due to poor echo windows and suboptimal apical window. IMPRESSIONS  1. Left ventricular ejection fraction, by visual estimation, is 35 to 40%. The left ventricle has moderately decreased function. There is moderately increased left ventricular hypertrophy.  2. Abnormal septal motion consistent with post-operative status.  3. Indeterminate diastolic filling due to E-A fusion.  4. The left ventricle demonstrates global hypokinesis.  5. Global right ventricle  has moderately reduced systolic function.The right ventricular size is mildly enlarged. No increase in right ventricular wall thickness.  6. Left atrial size was normal.  7. Right atrial size was normal.  8. Mild mitral annular calcification.  9. The mitral valve is degenerative. Mild mitral valve regurgitation. 10. The tricuspid valve is grossly normal. Tricuspid valve regurgitation is trivial. 11. The aortic valve has an indeterminant number of cusps. Aortic valve regurgitation is mild. Mild aortic valve stenosis. 12. Mild aortic stenosis. Suspect functional fusion of the RCC/LCC. Likely tricuspid. 13. The pulmonic valve was grossly normal. Pulmonic valve regurgitation is not visualized. 14. TR signal is inadequate for assessing pulmonary artery systolic pressure. 15. The inferior vena cava is dilated in size with <50% respiratory variability, suggesting right atrial pressure of 15 mmHg. 16. A prior study was performed on 02/16/2019. 17. EF is 35-40%. Prior study at [facility] compared via care everywhere. EF noted to be 40% during that study. Appears unchanged when compared with the report. Direct images from [facility] unable to be viewed. 18. No change from prior study. FINDINGS  Left Ventricle: Left ventricular ejection fraction, by visual estimation, is 35 to 40%. The left ventricle has moderately decreased function. The left ventricle demonstrates global hypokinesis. There is moderately increased left ventricular hypertrophy.  Concentric left ventricular hypertrophy. Abnormal (paradoxical) septal motion consistent with post-operative status. Indeterminate diastolic filling due to E-A fusion. Right Ventricle: The right ventricular size is mildly enlarged. No increase in right ventricular wall thickness. Global RV systolic function is has moderately reduced systolic function. Left Atrium: Left atrial size was normal in size. Right Atrium: Right atrial size was normal in size Pericardium: There is no evidence of pericardial  effusion. Mitral Valve: The mitral valve is degenerative in appearance. Mild mitral annular calcification. Mild mitral valve regurgitation. MV peak gradient, 100.0 mmHg. Tricuspid Valve: The tricuspid valve is grossly normal. Tricuspid valve regurgitation is trivial. Aortic Valve: The aortic valve has an indeterminant number of cusps. Aortic valve regurgitation is mild. Mild aortic stenosis is present. Aortic valve mean gradient measures 8.0 mmHg. Aortic valve peak gradient measures 14.1 mmHg. Mild aortic stenosis.  Suspect functional fusion of the RCC/LCC. Likely tricuspid. Pulmonic Valve: The pulmonic valve was grossly normal. Pulmonic valve regurgitation is not visualized. Pulmonic regurgitation is not visualized. Aorta: The aortic root is normal in size and structure. Venous: The inferior vena cava is dilated in size with less than 50% respiratory variability, suggesting right atrial pressure of 15 mmHg. IAS/Shunts: No atrial level shunt detected by color flow Doppler. Additional Comments: A prior study was performed on 02/16/2019.  LEFT VENTRICLE PLAX 2D LVIDd:         4.62 cm       Diastology LVIDs:         3.73 cm       LV e' lateral:   7.07 cm/s LV PW:         1.53 cm       LV E/e' lateral: 10.5 LV IVS:        2.37 cm       LV e' medial:    5.88 cm/s LV SV:         39 ml         LV E/e' medial:  12.6 LV SV Index:   20.12  LV Volumes (MOD) LV area d, A2C:    34.50 cm LV area s, A2C:    30.70 cm LV major d, A2C:   7.08 cm LV major s, A2C:   7.79 cm LV vol d, MOD A2C: 143.0 ml LV vol s, MOD A2C: 106.0 ml LV SV MOD A2C:     37.0 ml RIGHT VENTRICLE            IVC RV S prime:     5.33 cm/s  IVC diam: 2.43 cm TAPSE (M-mode): 1.1 cm LEFT ATRIUM              Index       RIGHT ATRIUM           Index LA diam:        5.20 cm  2.75 cm/m  RA Area:     16.40 cm LA Vol (A2C):   107.0 ml 56.65 ml/m RA Volume:   48.80 ml  25.84 ml/m LA Vol (A4C):   60.5 ml  32.03 ml/m LA Biplane Vol: 81.6 ml  43.21 ml/m  AORTIC VALVE AV  Vmax:           187.50 cm/s AV Vmean:          136.000 cm/s AV VTI:            0.432 m AV Peak Grad:      14.1 mmHg AV Mean Grad:      8.0 mmHg LVOT Vmax:         93.10 cm/s LVOT Vmean:        62.200 cm/s LVOT VTI:          0.198 m LVOT/AV VTI ratio: 0.46  AORTA Ao Root diam: 3.30 cm Ao Asc diam:  3.30 cm MITRAL VALVE MV Area (PHT): 4.44 cm             SHUNTS MV Peak grad:  100.0 mmHg           Systemic VTI: 0.20 m MV Mean grad:  74.0 mmHg MV Vmax:       5.00 m/s MV Vmean:      413.0 cm/s MV VTI:        1.67 m MV PHT:        49.59 msec MV Decel Time:  171 msec MR PISA:        1.57 cm MR PISA Radius: 0.50 cm MV E velocity: 73.90 cm/s 103 cm/s MV A velocity: 63.10 cm/s 70.3 cm/s MV E/A ratio:  1.17       1.5  Eleonore Chiquito MD Electronically signed by Eleonore Chiquito MD Signature Date/Time: 10/12/2019/5:14:09 PM    Final    VAS [place] LOWER EXTREMITY VENOUS (DVT)  Result Date: 10/13/2019  Lower Venous Study Indications: Swelling.  Risk Factors: COVID 19. Anticoagulation: Lovenox. Performing Technologist: Darlina Sicilian RDCS  Examination Guidelines: A complete evaluation includes B-mode imaging, spectral Doppler, color Doppler, and power Doppler as needed of all accessible portions of each vessel. Bilateral testing is considered an integral part of a complete examination. Limited examinations for reoccurring indications may be performed as noted.  +---------+---------------+---------+-----------+----------+--------------+ RIGHT    CompressibilityPhasicitySpontaneityPropertiesThrombus Aging +---------+---------------+---------+-----------+----------+--------------+ CFV      Full           Yes      Yes                                 +---------+---------------+---------+-----------+----------+--------------+ SFJ      Full                                                        +---------+---------------+---------+-----------+----------+--------------+ FV Prox  Full                                                         +---------+---------------+---------+-----------+----------+--------------+ FV Mid   Full                                                        +---------+---------------+---------+-----------+----------+--------------+ FV DistalFull                                                        +---------+---------------+---------+-----------+----------+--------------+ POP      Full                                                        +---------+---------------+---------+-----------+----------+--------------+ PTV      Full                                                        +---------+---------------+---------+-----------+----------+--------------+ PERO     Full           Yes      Yes                                 +---------+---------------+---------+-----------+----------+--------------+   +---------+---------------+---------+-----------+----------+--------------+  LEFT     CompressibilityPhasicitySpontaneityPropertiesThrombus Aging +---------+---------------+---------+-----------+----------+--------------+ CFV      Full           Yes      Yes                                 +---------+---------------+---------+-----------+----------+--------------+ SFJ      Full                                                        +---------+---------------+---------+-----------+----------+--------------+ FV Prox  Full                                                        +---------+---------------+---------+-----------+----------+--------------+ FV Mid   Full                                                        +---------+---------------+---------+-----------+----------+--------------+ FV DistalFull                                                        +---------+---------------+---------+-----------+----------+--------------+ POP      Full           Yes      Yes                                  +---------+---------------+---------+-----------+----------+--------------+ PTV      Full                                                        +---------+---------------+---------+-----------+----------+--------------+ PERO     Full                                                        +---------+---------------+---------+-----------+----------+--------------+     Summary: Right: No evidence of deep vein thrombosis in the lower extremity. No indirect evidence of obstruction proximal to the inguinal ligament. No cystic structure found in the popliteal fossa. Left: No evidence of deep vein thrombosis in the lower extremity. No indirect evidence of obstruction proximal to the inguinal ligament. No cystic structure found in the popliteal fossa.  *See table(s) above for measurements and observations. Electronically signed by Monica Martinez MD on 10/13/2019 at 1:47:38 PM.    Final

## 2019-10-14 NOTE — TOC Initial Note (Signed)
Transition of Care [facility]) - Initial/Assessment Note    Patient Details  Name: [name] MRN: [id number] Date of Birth: 12-09-1945  Transition of Care [facility]) CM/SW Contact:    Eileen Stanford, LCSW Phone Number: 10/14/2019, 12:37 PM  Clinical Narrative:       Pt is alert and oriented. Pt is agreeable to SNF. Pt would like to go to SNF close to home. Pt lives in [place]. Pt is agreeable to SNF in [place]. CSW will send referral and follow up with pt.        Expected Discharge Plan: Skilled Nursing Facility Barriers to Discharge: Continued Medical Work up   Patient Goals and CMS Choice Patient states their goals for this hospitalization and ongoing recovery are:: "to get better"   Choice offered to / list presented to : Patient  Expected Discharge Plan and Services Expected Discharge Plan: [place] In-house Referral: NA   Post Acute Care Choice: [place] Living arrangements for the past 2 months: Single Family Home                           HH Arranged: NA          Prior Living Arrangements/Services Living arrangements for the past 2 months: Single Family Home Lives with:: Spouse Patient language and need for interpreter reviewed:: Yes Do you feel safe going back to the place where you live?: Yes      Need for Family Participation in Patient Care: Yes (Comment) Care giver support system in place?: Yes (comment)   Criminal Activity/Legal Involvement Pertinent to Current Situation/Hospitalization: No - Comment as needed  Activities of Daily Living Home Assistive Devices/Equipment: None ADL Screening (condition at time of admission) Patient's cognitive ability adequate to safely complete daily activities?: No Is the patient deaf or have difficulty hearing?: Yes Does the patient have difficulty seeing, even when wearing glasses/contacts?: No Does the patient have difficulty concentrating, remembering, or making decisions?: No Patient  able to express need for assistance with ADLs?: Yes Does the patient have difficulty dressing or bathing?: Yes Independently performs ADLs?: No Communication: Independent Dressing (OT): Needs assistance Is this a change from baseline?: Change from baseline, expected to last <3days Grooming: Independent Feeding: Independent Bathing: Needs assistance Is this a change from baseline?: Change from baseline, expected to last >3 days Toileting: Needs assistance Is this a change from baseline?: Change from baseline, expected to last <3 days In/Out Bed: Needs assistance Is this a change from baseline?: Change from baseline, expected to last <3 days Walks in Home: Needs assistance Is this a change from baseline?: Change from baseline, expected to last <3 days Does the patient have difficulty walking or climbing stairs?: Yes Weakness of Legs: Both Weakness of Arms/Hands: None  Permission Sought/Granted Permission sought to share information with : Family Supports Permission granted to share information with : Yes, Verbal Permission Granted  Share Information with NAME: Bethena Roys  Permission granted to share info w AGENCY: [place] granted to share info w Relationship: Spouse     Emotional Assessment Appearance:: Appears stated age Attitude/Demeanor/Rapport: Engaged Affect (typically observed): Accepting, Appropriate, Calm Orientation: : Oriented to Situation, Oriented to  Time, Oriented to Place, Oriented to Self Alcohol / Substance Use: Not Applicable Psych Involvement: No (comment)  Admission diagnosis:  COVID-19 POSITIVE HYPOXIA Patient Active Problem List   Diagnosis Date Noted  . Pneumonia due to COVID-19 virus   . Acute systolic  CHF (congestive heart failure) ([place]) 10/11/2019  . Malignant neoplasm of prostate ([place]) 11/24/2016  . Bright red rectal bleeding 10/02/2016  . COPD exacerbation ([place]) 01/17/2016  . Chronic systolic heart failure ([place]) 12/12/2015  .  Essential hypertension 12/12/2015  . 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 Diabetes ([place]) 12/12/2015  . Tobacco abuse 12/12/2015   PCP:  [name], MD Pharmacy:   [facility] [address], [place] [address] Phone: [phone] Fax: [phone]     Social Determinants of Health (SDOH) Interventions    Readmission Risk Interventions No flowsheet data found.

## 2019-10-14 NOTE — NC FL2 (Signed)
[place] MEDICAID FL2 LEVEL OF CARE SCREENING TOOL     IDENTIFICATION  Patient Name: [name] Birthdate: 1946-06-30 Sex: male Admission Date (Current Location): 10/11/2019  [facility] and [place] Number:  Herbalist and Address:  [facility], [place] [address] [place], [place] [address]      Provider Number: [id number]  Attending Physician Name and Address:  Jonetta Osgood, MD  Relative Name and Phone Number:       Current Level of Care: Hospital Recommended Level of Care: [place] Prior Approval Number:    Date Approved/Denied:   PASRR Number: [id number]  Discharge Plan: SNF    Current Diagnoses: Patient Active Problem List   Diagnosis Date Noted  . Pneumonia due to COVID-19 virus   . Acute systolic CHF (congestive heart failure) ([place]) 10/11/2019  . Malignant neoplasm of prostate ([place]) 11/24/2016  . Bright red rectal bleeding 10/02/2016  . COPD exacerbation ([place]) 01/17/2016  . Chronic systolic heart failure ([place]) 12/12/2015  . Essential hypertension 12/12/2015  . COPD (chronic obstructive pulmonary disease) with chronic bronchitis ([place]) 12/12/2015  . Diabetes ([place]) 12/12/2015  . Tobacco abuse 12/12/2015    Orientation RESPIRATION BLADDER Height & Weight     Self, Time, Situation, Place  Normal Continent Weight: 162 lb 14.7 oz (73.9 kg) Height:  5\' 6"  (167.6 cm)  BEHAVIORAL SYMPTOMS/MOOD NEUROLOGICAL BOWEL NUTRITION STATUS      Continent Diet(heart healthy/carb modified)  AMBULATORY STATUS COMMUNICATION OF NEEDS Skin   Limited Assist Verbally Normal                       Personal Care Assistance Level of Assistance  Bathing, Dressing, Feeding Bathing Assistance: Limited assistance Feeding assistance: Independent Dressing Assistance: Limited assistance     Functional Limitations Info  Sight, Hearing, Speech Sight Info: Adequate Hearing Info: Adequate Speech Info: Adequate    SPECIAL CARE  FACTORS FREQUENCY  OT (By licensed OT), PT (By licensed PT)     PT Frequency: 5x OT Frequency: 5x            Contractures Contractures Info: Not present    Additional Factors Info  Code Status, Allergies Code Status Info: Full Code Allergies Info: NO known allergies           Current Medications (10/14/2019):  This is the current hospital active medication list Current Facility-Administered Medications  Medication Dose Route Frequency Provider Last Rate Last Admin  . acetaminophen (TYLENOL) tablet 650 mg  650 mg Oral Q6H PRN Cherene Altes, MD      . albuterol (VENTOLIN HFA) 108 (90 Base) MCG/ACT inhaler 2 puff  2 puff Inhalation Q6H PRN Cherene Altes, MD      . aspirin EC tablet 81 mg  81 mg Oral Daily Cherene Altes, MD   81 mg at 10/14/19 1740  . bisacodyl (DULCOLAX) suppository 10 mg  10 mg Rectal Daily PRN Joette Catching T, MD      . chlorpheniramine-HYDROcodone (TUSSIONEX) 10-8 MG/5ML suspension 5 mL  5 mL Oral Q12H PRN Cherene Altes, MD      . clopidogrel (PLAVIX) tablet 75 mg  75 mg Oral Daily Cherene Altes, MD   75 mg at 10/14/19 8144  . dexamethasone (DECADRON) injection 6 mg  6 mg Intravenous Q24H Cherene Altes, MD   6 mg at 10/13/19 1323  . enoxaparin (LOVENOX) injection 40 mg  40 mg Subcutaneous Q24H Ghimire, Shanker M,  MD      . furosemide (LASIX) tablet 40 mg  40 mg Oral BID Jonetta Osgood, MD   40 mg at 10/14/19 0947  . gabapentin (NEURONTIN) capsule 100 mg  100 mg Oral BID Cherene Altes, MD   100 mg at 10/14/19 6948  . guaiFENesin-dextromethorphan (ROBITUSSIN DM) 100-10 MG/5ML syrup 10 mL  10 mL Oral Q4H PRN Cherene Altes, MD      . hydrALAZINE (APRESOLINE) tablet 25 mg  25 mg Oral Q8H Ghimire, Henreitta Leber, MD   25 mg at 10/14/19 0830  . insulin aspart (novoLOG) injection 0-5 Units  0-5 Units Subcutaneous QHS Cherene Altes, MD   2 Units at 10/13/19 2245  . insulin aspart (novoLOG) injection 0-9 Units  0-9 Units  Subcutaneous TID WC Cherene Altes, MD   3 Units at 10/13/19 1657  . insulin glargine (LANTUS) injection 53 Units  53 Units Subcutaneous QHS Cherene Altes, MD   53 Units at 10/13/19 2244  . isosorbide mononitrate (IMDUR) 24 hr tablet 30 mg  30 mg Oral Daily Jonetta Osgood, MD   30 mg at 10/14/19 0829  . loperamide (IMODIUM) capsule 2 mg  2 mg Oral PRN Joette Catching T, MD      . magnesium oxide (MAG-OX) tablet 200 mg  200 mg Oral Daily Cherene Altes, MD   200 mg at 10/14/19 5462  . [START ON 10/15/2019] metoprolol tartrate (LOPRESSOR) tablet 50 mg  50 mg Oral BID Jonetta Osgood, MD      . multivitamin (PROSIGHT) tablet 1 tablet  1 tablet Oral Daily Cherene Altes, MD   1 tablet at 10/14/19 0947  . multivitamin with minerals tablet 1 tablet  1 tablet Oral Daily Cherene Altes, MD   1 tablet at 10/14/19 [phone]  . ondansetron (ZOFRAN) tablet 4 mg  4 mg Oral Q6H PRN Cherene Altes, MD       Or  . ondansetron [facility]) injection 4 mg  4 mg Intravenous Q6H PRN Cherene Altes, MD      . pantoprazole (PROTONIX) EC tablet 40 mg  40 mg Oral Daily Cherene Altes, MD   40 mg at 10/14/19 0093  . polyethylene glycol (MIRALAX / GLYCOLAX) packet 17 g  17 g Oral Daily PRN Cherene Altes, MD      . remdesivir 100 mg in sodium chloride 0.9 % 100 mL IVPB  100 mg Intravenous Daily Bertis Ruddy, RPH 200 mL/hr at 10/14/19 0834 100 mg at 10/14/19 0834  . senna (SENOKOT) tablet 8.6 mg  1 tablet Oral BID Cherene Altes, MD   8.6 mg at 10/14/19 8182  . simvastatin (ZOCOR) tablet 20 mg  20 mg Oral QHS Cherene Altes, MD   20 mg at 10/13/19 2244  . traMADol (ULTRAM) tablet 50 mg  50 mg Oral Q12H PRN Cherene Altes, MD      . vitamin C (ASCORBIC ACID) tablet 500 mg  500 mg Oral Daily Cherene Altes, MD   500 mg at 10/14/19 9937  . Vitamin D3 (Vitamin D) tablet 1,000 Units  1,000 Units Oral Daily Cherene Altes, MD   1,000 Units at 10/14/19 [phone]  . zinc sulfate  capsule 220 mg  220 mg Oral Daily Cherene Altes, MD   220 mg at 10/14/19 7893     Discharge Medications: Please see discharge summary for a list of discharge medications.  Relevant Imaging Results:  Relevant  Lab Results:   Additional Information NZV:[id number]  Gerrianne Scale Reygan Heagle, LCSW

## 2019-10-15 DIAGNOSIS — M2550 Pain in unspecified joint: Secondary | ICD-10-CM | POA: Diagnosis not present

## 2019-10-15 DIAGNOSIS — E559 Vitamin D deficiency, unspecified: Secondary | ICD-10-CM | POA: Diagnosis not present

## 2019-10-15 DIAGNOSIS — Z8546 Personal history of malignant neoplasm of prostate: Secondary | ICD-10-CM | POA: Diagnosis not present

## 2019-10-15 DIAGNOSIS — I5041 Acute combined systolic (congestive) and diastolic (congestive) heart failure: Secondary | ICD-10-CM | POA: Diagnosis not present

## 2019-10-15 DIAGNOSIS — I10 Essential (primary) hypertension: Secondary | ICD-10-CM | POA: Diagnosis not present

## 2019-10-15 DIAGNOSIS — J449 Chronic obstructive pulmonary disease, unspecified: Secondary | ICD-10-CM | POA: Diagnosis not present

## 2019-10-15 DIAGNOSIS — U071 COVID-19: Secondary | ICD-10-CM | POA: Diagnosis not present

## 2019-10-15 DIAGNOSIS — I469 Cardiac arrest, cause unspecified: Secondary | ICD-10-CM | POA: Diagnosis not present

## 2019-10-15 DIAGNOSIS — M6281 Muscle weakness (generalized): Secondary | ICD-10-CM | POA: Diagnosis not present

## 2019-10-15 DIAGNOSIS — E876 Hypokalemia: Secondary | ICD-10-CM | POA: Diagnosis not present

## 2019-10-15 DIAGNOSIS — Z79899 Other long term (current) drug therapy: Secondary | ICD-10-CM | POA: Diagnosis not present

## 2019-10-15 DIAGNOSIS — J1289 Other viral pneumonia: Secondary | ICD-10-CM | POA: Diagnosis not present

## 2019-10-15 DIAGNOSIS — Z794 Long term (current) use of insulin: Secondary | ICD-10-CM

## 2019-10-15 DIAGNOSIS — Z7401 Bed confinement status: Secondary | ICD-10-CM | POA: Diagnosis not present

## 2019-10-15 DIAGNOSIS — E1169 Type 2 diabetes mellitus with other specified complication: Secondary | ICD-10-CM

## 2019-10-15 DIAGNOSIS — D649 Anemia, unspecified: Secondary | ICD-10-CM | POA: Diagnosis not present

## 2019-10-15 DIAGNOSIS — I5021 Acute systolic (congestive) heart failure: Secondary | ICD-10-CM | POA: Diagnosis not present

## 2019-10-15 DIAGNOSIS — E119 Type 2 diabetes mellitus without complications: Secondary | ICD-10-CM | POA: Diagnosis not present

## 2019-10-15 DIAGNOSIS — R52 Pain, unspecified: Secondary | ICD-10-CM | POA: Diagnosis not present

## 2019-10-15 DIAGNOSIS — R6889 Other general symptoms and signs: Secondary | ICD-10-CM | POA: Diagnosis not present

## 2019-10-15 DIAGNOSIS — J189 Pneumonia, unspecified organism: Secondary | ICD-10-CM | POA: Diagnosis not present

## 2019-10-15 DIAGNOSIS — E785 Hyperlipidemia, unspecified: Secondary | ICD-10-CM | POA: Diagnosis not present

## 2019-10-15 DIAGNOSIS — K219 Gastro-esophageal reflux disease without esophagitis: Secondary | ICD-10-CM | POA: Diagnosis not present

## 2019-10-15 DIAGNOSIS — N184 Chronic kidney disease, stage 4 (severe): Secondary | ICD-10-CM | POA: Diagnosis not present

## 2019-10-15 DIAGNOSIS — E1340 Other specified diabetes mellitus with diabetic neuropathy, unspecified: Secondary | ICD-10-CM | POA: Diagnosis not present

## 2019-10-15 DIAGNOSIS — J9621 Acute and chronic respiratory failure with hypoxia: Secondary | ICD-10-CM | POA: Diagnosis not present

## 2019-10-15 LAB — CBC WITH DIFFERENTIAL/PLATELET
Abs Immature Granulocytes: 0.06 10*3/uL (ref 0.00–0.07)
Basophils Absolute: 0 10*3/uL (ref 0.0–0.1)
Basophils Relative: 0 %
Eosinophils Absolute: 0 10*3/uL (ref 0.0–0.5)
Eosinophils Relative: 0 %
HCT: 49.6 % (ref 39.0–52.0)
Hemoglobin: 15.9 g/dL (ref 13.0–17.0)
Immature Granulocytes: 1 %
Lymphocytes Relative: 11 %
Lymphs Abs: 1 10*3/uL (ref 0.7–4.0)
MCH: 28.3 pg (ref 26.0–34.0)
MCHC: 32.1 g/dL (ref 30.0–36.0)
MCV: 88.3 fL (ref 80.0–100.0)
Monocytes Absolute: 0.6 10*3/uL (ref 0.1–1.0)
Monocytes Relative: 7 %
Neutro Abs: 7.6 10*3/uL (ref 1.7–7.7)
Neutrophils Relative %: 81 %
Platelets: 285 10*3/uL (ref 150–400)
RBC: 5.62 MIL/uL (ref 4.22–5.81)
RDW: 14.8 % (ref 11.5–15.5)
WBC: 9.3 10*3/uL (ref 4.0–10.5)
nRBC: 0 % (ref 0.0–0.2)

## 2019-10-15 LAB — COMPREHENSIVE METABOLIC PANEL
ALT: 40 U/L (ref 0–44)
AST: 20 U/L (ref 15–41)
Albumin: 3.3 g/dL — ABNORMAL LOW (ref 3.5–5.0)
Alkaline Phosphatase: 129 U/L — ABNORMAL HIGH (ref 38–126)
Anion gap: 13 (ref 5–15)
BUN: 52 mg/dL — ABNORMAL HIGH (ref 8–23)
CO2: 31 mmol/L (ref 22–32)
Calcium: 8.8 mg/dL — ABNORMAL LOW (ref 8.9–10.3)
Chloride: 92 mmol/L — ABNORMAL LOW (ref 98–111)
Creatinine, Ser: 1.82 mg/dL — ABNORMAL HIGH (ref 0.61–1.24)
GFR calc Af Amer: 41 mL/min — ABNORMAL LOW (ref >60)
GFR calc non Af Amer: 36 mL/min — ABNORMAL LOW (ref >60)
Glucose, Bld: 169 mg/dL — ABNORMAL HIGH (ref 70–99)
Potassium: 4 mmol/L (ref 3.5–5.1)
Sodium: 136 mmol/L (ref 135–145)
Total Bilirubin: 0.8 mg/dL (ref 0.3–1.2)
Total Protein: 6.3 g/dL — ABNORMAL LOW (ref 6.5–8.1)

## 2019-10-15 LAB — MAGNESIUM: Magnesium: 2.3 mg/dL (ref 1.7–2.4)

## 2019-10-15 LAB — FERRITIN: Ferritin: 55 ng/mL (ref 24–336)

## 2019-10-15 LAB — GLUCOSE, CAPILLARY
Glucose-Capillary: 61 mg/dL — ABNORMAL LOW (ref 70–99)
Glucose-Capillary: 156 mg/dL — ABNORMAL HIGH (ref 70–99)

## 2019-10-15 LAB — C-REACTIVE PROTEIN: CRP: 1.2 mg/dL — ABNORMAL HIGH (ref <1.0)

## 2019-10-15 LAB — D-DIMER, QUANTITATIVE: D-Dimer, Quant: 0.44 ug/mL-FEU (ref 0.00–0.50)

## 2019-10-15 MED ORDER — HYDRALAZINE HCL 25 MG PO TABS: 50.0000 mg | ORAL_TABLET | Freq: Three times a day (TID) | ORAL | Status: DC

## 2019-10-15 MED ORDER — INSULIN GLARGINE 100 UNIT/ML ~~LOC~~ SOLN
45.0000 [IU] | Freq: Every day | SUBCUTANEOUS | Status: DC
  Filled 2019-10-15: qty 0.45

## 2019-10-15 MED ORDER — ISOSORBIDE MONONITRATE ER 30 MG PO TB24: 30.0000 mg | ORAL_TABLET | Freq: Every day | ORAL | Status: DC

## 2019-10-15 MED ORDER — LANTUS SOLOSTAR 100 UNIT/ML ~~LOC~~ SOPN: 45.0000 [IU] | PEN_INJECTOR | Freq: Every day | SUBCUTANEOUS | 11 refills | Status: DC

## 2019-10-15 MED ORDER — INSULIN ASPART 100 UNIT/ML ~~LOC~~ SOLN: SUBCUTANEOUS | 11 refills | Status: AC

## 2019-10-15 MED ORDER — DEXAMETHASONE 4 MG PO TABS: 4.0000 mg | ORAL_TABLET | Freq: Every day | ORAL | 0 refills | Status: AC

## 2019-10-15 MED ORDER — HYDRALAZINE HCL 50 MG PO TABS: 50.0000 mg | ORAL_TABLET | Freq: Three times a day (TID) | ORAL | Status: DC

## 2019-10-15 NOTE — Discharge Instructions (Signed)
Person Under Monitoring Name: [name]  Location: [address] [place] [address]   Infection Prevention Recommendations for Individuals Confirmed to have, or Being Evaluated for, 2019 Novel Coronavirus (COVID-19) Infection Who Receive Care at Home  Individuals who are confirmed to have, or are being evaluated for, COVID-19 should follow the prevention steps below until a healthcare provider or local or state health department says they can return to normal activities.  Stay home except to get medical care You should restrict activities outside your home, except for getting medical care. Do not go to work, school, or public areas, and do not use public transportation or taxis.  Call ahead before visiting your doctor Before your medical appointment, call the healthcare provider and tell them that you have, or are being evaluated for, COVID-19 infection. This will help the healthcare provider's office take steps to keep other people from getting infected. Ask your healthcare provider to call the local or state health department.  Monitor your symptoms Seek prompt medical attention if your illness is worsening (e.g., difficulty breathing). Before going to your medical appointment, call the healthcare provider and tell them that you have, or are being evaluated for, COVID-19 infection. Ask your healthcare provider to call the local or state health department.  Wear a facemask You should wear a facemask that covers your nose and mouth when you are in the same room with other people and when you visit a healthcare provider. People who live with or visit you should also wear a facemask while they are in the same room with you.  Separate yourself from other people in your home As much as possible, you should stay in a different room from other people in your home. Also, you should use a separate bathroom, if available.  Avoid sharing household items You should not share dishes,  drinking glasses, cups, eating utensils, towels, bedding, or other items with other people in your home. After using these items, you should wash them thoroughly with soap and water.  Cover your coughs and sneezes Cover your mouth and nose with a tissue when you cough or sneeze, or you can cough or sneeze into your sleeve. Throw used tissues in a lined trash can, and immediately wash your hands with soap and water for at least 20 seconds or use an alcohol-based hand rub.  Wash your [facility] your hands often and thoroughly with soap and water for at least 20 seconds. You can use an alcohol-based hand sanitizer if soap and water are not available and if your hands are not visibly dirty. Avoid touching your eyes, nose, and mouth with unwashed hands.   Prevention Steps for Caregivers and Household Members of Individuals Confirmed to have, or Being Evaluated for, COVID-19 Infection Being Cared for in the Home  If you live with, or provide care at home for, a person confirmed to have, or being evaluated for, COVID-19 infection please follow these guidelines to prevent infection:  Follow healthcare provider's instructions Make sure that you understand and can help the patient follow any healthcare provider instructions for all care.  Provide for the patient's basic needs You should help the patient with basic needs in the home and provide support for getting groceries, prescriptions, and other personal needs.  Monitor the patient's symptoms If they are getting sicker, call his or her medical provider and tell them that the patient has, or is being evaluated for, COVID-19 infection. This will help the healthcare provider's office  take steps to keep other people from getting infected. Ask the healthcare provider to call the local or state health department.  Limit the number of people who have contact with the patient  If possible, have only one caregiver for the patient.  Other  household members should stay in another home or place of residence. If this is not possible, they should stay  in another room, or be separated from the patient as much as possible. Use a separate bathroom, if available.  Restrict visitors who do not have an essential need to be in the home.  Keep older adults, very young children, and other sick people away from the patient Keep older adults, very young children, and those who have compromised immune systems or chronic health conditions away from the patient. This includes people with chronic heart, lung, or kidney conditions, diabetes, and cancer.  Ensure good ventilation Make sure that shared spaces in the home have good air flow, such as from an air conditioner or an opened window, weather permitting.  Wash your hands often  Wash your hands often and thoroughly with soap and water for at least 20 seconds. You can use an alcohol based hand sanitizer if soap and water are not available and if your hands are not visibly dirty.  Avoid touching your eyes, nose, and mouth with unwashed hands.  Use disposable paper towels to dry your hands. If not available, use dedicated cloth towels and replace them when they become wet.  Wear a facemask and gloves  Wear a disposable facemask at all times in the room and gloves when you touch or have contact with the patient's blood, body fluids, and/or secretions or excretions, such as sweat, saliva, sputum, nasal mucus, vomit, urine, or feces.  Ensure the mask fits over your nose and mouth tightly, and do not touch it during use.  Throw out disposable facemasks and gloves after using them. Do not reuse.  Wash your hands immediately after removing your facemask and gloves.  If your personal clothing becomes contaminated, carefully remove clothing and launder. Wash your hands after handling contaminated clothing.  Place all used disposable facemasks, gloves, and other waste in a lined container before  disposing them with other household waste.  Remove gloves and wash your hands immediately after handling these items.  Do not share dishes, glasses, or other household items with the patient  Avoid sharing household items. You should not share dishes, drinking glasses, cups, eating utensils, towels, bedding, or other items with a patient who is confirmed to have, or being evaluated for, COVID-19 infection.  After the person uses these items, you should wash them thoroughly with soap and water.  Wash laundry thoroughly  Immediately remove and wash clothes or bedding that have blood, body fluids, and/or secretions or excretions, such as sweat, saliva, sputum, nasal mucus, vomit, urine, or feces, on them.  Wear gloves when handling laundry from the patient.  Read and follow directions on labels of laundry or clothing items and detergent. In general, wash and dry with the warmest temperatures recommended on the label.  Clean all areas the individual has used often  Clean all touchable surfaces, such as counters, tabletops, doorknobs, bathroom fixtures, toilets, phones, keyboards, tablets, and bedside tables, every day. Also, clean any surfaces that may have blood, body fluids, and/or secretions or excretions on them.  Wear gloves when cleaning surfaces the patient has come in contact with.  Use a diluted bleach solution (e.g., dilute bleach with 1 part  bleach and 10 parts water) or a household disinfectant with a label that says EPA-registered for coronaviruses. To make a bleach solution at home, add 1 tablespoon of bleach to 1 quart (4 cups) of water. For a larger supply, add  cup of bleach to 1 gallon (16 cups) of water.  Read labels of cleaning products and follow recommendations provided on product labels. Labels contain instructions for safe and effective use of the cleaning product including precautions you should take when applying the product, such as wearing gloves or eye protection  and making sure you have good ventilation during use of the product.  Remove gloves and wash hands immediately after cleaning.  Monitor yourself for signs and symptoms of illness Caregivers and household members are considered close contacts, should monitor their health, and will be asked to limit movement outside of the home to the extent possible. Follow the monitoring steps for close contacts listed on the symptom monitoring form.   ? If you have additional questions, contact your local health department or call the epidemiologist on call at [phone] (available 24/7). ? This guidance is subject to change. For the most up-to-date guidance from [facility], please refer to their website: YouBlogs.pl

## 2019-10-15 NOTE — Progress Notes (Signed)
Report given to Marilynne Halsted, RN at [facility].

## 2019-10-15 NOTE — TOC Transition Note (Addendum)
Transition of Care [facility]) - CM/SW Discharge Note   Patient Details  Name: [name] MRN: [id number] Date of Birth: February 13, 1946  Transition of Care [facility]) CM/SW Contact:  Alberteen Sam, LCSW Phone Number: 10/15/2019, 10:56 AM   Clinical Narrative:     Patient will DC to: [facility] Anticipated DC date: 10/15/2019 Family notified: Bethena Roys Transport VC:[facility]  Per MD patient ready for DC to [place], patient, patient's family, and facility notified of DC. Discharge Summary sent to facility. CSW confirmed with RN at facility they are prepared to accept patient today.  RN given number for report  [phone]  . DC packet on chart. Ambulance transport requested for patient for 12:00 pm per RN request.  Victor signing off.  [place], [place]    Final next level of care: Skilled Nursing Facility Barriers to Discharge: No Barriers Identified   Patient Goals and CMS Choice Patient states their goals for this hospitalization and ongoing recovery are:: "to get better" CMS Medicare.gov Compare Post Acute Care list provided to:: Patient Choice offered to / list presented to : Patient  Discharge Placement PASRR number recieved: 10/14/19            Patient [facility] bed at: [facility] Patient to be transferred to facility by: [place] Name of family member notified: spouse Bethena Roys Patient and family notified of of transfer: 10/15/19  Discharge Plan and Services In-house Referral: NA   Post Acute Care Choice: [place] Arranged: NA          Social Determinants of Health (SDOH) Interventions     Readmission Risk Interventions No flowsheet data found.

## 2019-10-15 NOTE — Discharge Summary (Addendum)
PATIENT DETAILS Name: [name] Age: 73 y.o. Sex: male Date of Birth: Feb 02, 1946 MRN: [id number]. Admitting Physician: Bernadette Hoit, DO PFX:[id number], Beverely Low, MD  Admit Date: 10/11/2019 Discharge date: 10/15/2019  Recommendations for Outpatient Follow-up:  1. Follow up with PCP in 1-2 weeks 2. Please obtain CMP/CBC in one week 3. Repeat Chest Xray in 4-6 week 4. Please ensure follow-up with cardiology. 5. Beta-blocker on hold due to type I second-degree heart block  Admitted From:  Home  Disposition: SNF   Home Health: No  Equipment/Devices: None  Discharge Condition: Stable  CODE STATUS: FULL CODE  Diet recommendation:  Diet Order            Diet - low sodium heart healthy        Diet Carb Modified        Diet heart healthy/carb modified Room service appropriate? Yes; Fluid consistency: Thin  Diet effective now               Brief Summary: See H&P, Labs, Consult and Test reports for all details in brief, Patient is a 73 y.o. male with PMHx of chronic systolic heart failure, COPD s/p CABG, PAD, CVA, COPD who presented to [facility] ED with shortness of breath-he was found to have acute hypoxic respiratory failure secondary to COVID-19 pneumonia and decompensated systolic heart failure.  See below for further details  Brief Hospital Course: Acute Hypoxic Resp Failure due to Covid 19 Viral pneumonia and decompensated systolic heart failure ( EF 35-40% by TTE on 12/9): Significantly improved with diuresis, remdesivir and steroids.  Suspect he had more of decompensated heart failure than Covid pneumonitis.  He has responded very well to diuretics.  He is on room air.  He has no signs of volume overload on exam.    Will complete remdesivir today, he will go home on his usual dosing of diuretics-and tapering steroids for a few more days.  COVID-19 Labs:  Recent Labs    10/13/19 0100 10/14/19 0020 10/15/19 0056  DDIMER 1.21* 0.49 0.44  FERRITIN 64 62 55  CRP  1.2* 1.0* 1.2*    No results found for: SARSCOV2NAA   COVID-19 Medications: Steroids: 12/8>> Remdesivir: 12/7>>12/12  Elevated D-dimer: D-dimer has significantly improved-lower extremity Doppler negative.  Was briefly on therapeutic dose of Lovenox-but since D-dimer has rapidly normalized (suspect significant elevated D-dimer that 1 day was probably a lab error)-patient was transitioned back to prophylactic dosing of Lovenox.  He will continue to be on dual antiplatelet agents on discharge.   Type I second-degree heart block: Telemetry strips reviewed overnight-appears to have had a episode of type I second-degree heart block-and some episodes of NSVT/PVCs.  Beta-blocker has been discontinued.  Case was discussed with Dr. Radford Pax (cardiology) who reviewed his chart-and advise continuing to hold beta-blocker.  Patient asked to follow-up with his primary cardiologist at [facility].  CAD s/p CABG: No anginal symptoms-continue dual antiplatelet agents (outpatient regimen) along with statin/metoprolol  PAD/CVA: Appears stable-continue antiplatelet agents and statin.  DM-2 (A1c 8.9 on 12/9): CBGs relatively stable-had early morning hypoglycemia-decrease Lantus to 46 units, continue SSI.  Follow and adjust closely at SNF.    CBG (last 3)  Recent Labs    10/14/19 2225 10/15/19 0715 10/15/19 0818  GLUCAP 233* 61* 156*   HTN: Controlled-continue hydralazine, Imdur and Lasix.  COPD: Stable-continue bronchodilators.  Procedures/Studies: Echo 12/19>> 1. Left ventricular ejection fraction, by visual estimation, is 35 to 40%. The left ventricle has moderately decreased function. There is moderately increased  left ventricular hypertrophy.  2. Abnormal septal motion consistent with post-operative status.  3. Indeterminate diastolic filling due to E-A fusion.  4. The left ventricle demonstrates global hypokinesis.  5. Global right ventricle has moderately reduced systolic function.The right  ventricular size is mildly enlarged. No increase in right ventricular wall thickness.  6. Left atrial size was normal.  7. Right atrial size was normal.  8. Mild mitral annular calcification.  9. The mitral valve is degenerative. Mild mitral valve regurgitation. 10. The tricuspid valve is grossly normal. Tricuspid valve regurgitation is trivial. 11. The aortic valve has an indeterminant number of cusps. Aortic valve regurgitation is mild. Mild aortic valve stenosis. 12. Mild aortic stenosis. Suspect functional fusion of the RCC/LCC. Likely tricuspid. 13. The pulmonic valve was grossly normal. Pulmonic valve regurgitation is not visualized. 14. TR signal is inadequate for assessing pulmonary artery systolic pressure. 15. The inferior vena cava is dilated in size with <50% respiratory variability, suggesting right atrial pressure of 15 mmHg. 16. A prior study was performed on 02/16/2019. 17. EF is 35-40%. Prior study at [facility] compared via care everywhere. EF noted to be 40% during that study. Appears unchanged when compared with the report. Direct images from [facility] unable to be viewed. 18. No change from prior study.  Discharge Diagnoses:  Active Problems:   Acute systolic CHF (congestive heart failure) ([place])   Pneumonia due to COVID-19 virus   Discharge Instructions:    Person Under Monitoring Name: [name]  Location: [address] [place] [address]   Infection Prevention Recommendations for Individuals Confirmed to have, or Being Evaluated for, 2019 Novel Coronavirus (COVID-19) Infection Who Receive Care at Home  Individuals who are confirmed to have, or are being evaluated for, COVID-19 should follow the prevention steps below until a healthcare provider or local or state health department says they can return to normal activities.  Stay home except to get medical care You should restrict activities outside your home, except for getting medical care. Do not go to work, school,  or public areas, and do not use public transportation or taxis.  Call ahead before visiting your doctor Before your medical appointment, call the healthcare provider and tell them that you have, or are being evaluated for, COVID-19 infection. This will help the healthcare provider's office take steps to keep other people from getting infected. Ask your healthcare provider to call the local or state health department.  Monitor your symptoms Seek prompt medical attention if your illness is worsening (e.g., difficulty breathing). Before going to your medical appointment, call the healthcare provider and tell them that you have, or are being evaluated for, COVID-19 infection. Ask your healthcare provider to call the local or state health department.  Wear a facemask You should wear a facemask that covers your nose and mouth when you are in the same room with other people and when you visit a healthcare provider. People who live with or visit you should also wear a facemask while they are in the same room with you.  Separate yourself from other people in your home As much as possible, you should stay in a different room from other people in your home. Also, you should use a separate bathroom, if available.  Avoid sharing household items You should not share dishes, drinking glasses, cups, eating utensils, towels, bedding, or other items with other people in your home. After using these items, you should wash them thoroughly with soap and water.  Cover your coughs  and sneezes Cover your mouth and nose with a tissue when you cough or sneeze, or you can cough or sneeze into your sleeve. Throw used tissues in a lined trash can, and immediately wash your hands with soap and water for at least 20 seconds or use an alcohol-based hand rub.  Wash your [facility] your hands often and thoroughly with soap and water for at least 20 seconds. You can use an alcohol-based hand sanitizer if soap and  water are not available and if your hands are not visibly dirty. Avoid touching your eyes, nose, and mouth with unwashed hands.   Prevention Steps for Caregivers and Household Members of Individuals Confirmed to have, or Being Evaluated for, COVID-19 Infection Being Cared for in the Home  If you live with, or provide care at home for, a person confirmed to have, or being evaluated for, COVID-19 infection please follow these guidelines to prevent infection:  Follow healthcare provider's instructions Make sure that you understand and can help the patient follow any healthcare provider instructions for all care.  Provide for the patient's basic needs You should help the patient with basic needs in the home and provide support for getting groceries, prescriptions, and other personal needs.  Monitor the patient's symptoms If they are getting sicker, call his or her medical provider and tell them that the patient has, or is being evaluated for, COVID-19 infection. This will help the healthcare provider's office take steps to keep other people from getting infected. Ask the healthcare provider to call the local or state health department.  Limit the number of people who have contact with the patient  If possible, have only one caregiver for the patient.  Other household members should stay in another home or place of residence. If this is not possible, they should stay  in another room, or be separated from the patient as much as possible. Use a separate bathroom, if available.  Restrict visitors who do not have an essential need to be in the home.  Keep older adults, very young children, and other sick people away from the patient Keep older adults, very young children, and those who have compromised immune systems or chronic health conditions away from the patient. This includes people with chronic heart, lung, or kidney conditions, diabetes, and cancer.  Ensure good ventilation Make  sure that shared spaces in the home have good air flow, such as from an air conditioner or an opened window, weather permitting.  Wash your hands often  Wash your hands often and thoroughly with soap and water for at least 20 seconds. You can use an alcohol based hand sanitizer if soap and water are not available and if your hands are not visibly dirty.  Avoid touching your eyes, nose, and mouth with unwashed hands.  Use disposable paper towels to dry your hands. If not available, use dedicated cloth towels and replace them when they become wet.  Wear a facemask and gloves  Wear a disposable facemask at all times in the room and gloves when you touch or have contact with the patient's blood, body fluids, and/or secretions or excretions, such as sweat, saliva, sputum, nasal mucus, vomit, urine, or feces.  Ensure the mask fits over your nose and mouth tightly, and do not touch it during use.  Throw out disposable facemasks and gloves after using them. Do not reuse.  Wash your hands immediately after removing your facemask and gloves.  If your personal clothing becomes contaminated, carefully remove  clothing and launder. Wash your hands after handling contaminated clothing.  Place all used disposable facemasks, gloves, and other waste in a lined container before disposing them with other household waste.  Remove gloves and wash your hands immediately after handling these items.  Do not share dishes, glasses, or other household items with the patient  Avoid sharing household items. You should not share dishes, drinking glasses, cups, eating utensils, towels, bedding, or other items with a patient who is confirmed to have, or being evaluated for, COVID-19 infection.  After the person uses these items, you should wash them thoroughly with soap and water.  Wash laundry thoroughly  Immediately remove and wash clothes or bedding that have blood, body fluids, and/or secretions or excretions,  such as sweat, saliva, sputum, nasal mucus, vomit, urine, or feces, on them.  Wear gloves when handling laundry from the patient.  Read and follow directions on labels of laundry or clothing items and detergent. In general, wash and dry with the warmest temperatures recommended on the label.  Clean all areas the individual has used often  Clean all touchable surfaces, such as counters, tabletops, doorknobs, bathroom fixtures, toilets, phones, keyboards, tablets, and bedside tables, every day. Also, clean any surfaces that may have blood, body fluids, and/or secretions or excretions on them.  Wear gloves when cleaning surfaces the patient has come in contact with.  Use a diluted bleach solution (e.g., dilute bleach with 1 part bleach and 10 parts water) or a household disinfectant with a label that says EPA-registered for coronaviruses. To make a bleach solution at home, add 1 tablespoon of bleach to 1 quart (4 cups) of water. For a larger supply, add  cup of bleach to 1 gallon (16 cups) of water.  Read labels of cleaning products and follow recommendations provided on product labels. Labels contain instructions for safe and effective use of the cleaning product including precautions you should take when applying the product, such as wearing gloves or eye protection and making sure you have good ventilation during use of the product.  Remove gloves and wash hands immediately after cleaning.  Monitor yourself for signs and symptoms of illness Caregivers and household members are considered close contacts, should monitor their health, and will be asked to limit movement outside of the home to the extent possible. Follow the monitoring steps for close contacts listed on the symptom monitoring form.   ? If you have additional questions, contact your local health department or call the epidemiologist on call at [phone] (available 24/7). ? This guidance is subject to change. For the most  up-to-date guidance from CDC, please refer to their website: YouBlogs.pl    Activity:  As tolerated with Full fall precautions use walker/cane & assistance as needed  Discharge Instructions    (HEART FAILURE PATIENTS) Call MD:  Anytime you have any of the following symptoms: 1) 3 pound weight gain in 24 hours or 5 pounds in 1 week 2) shortness of breath, with or without a dry hacking cough 3) swelling in the hands, feet or stomach 4) if you have to sleep on extra pillows at night in order to breathe.   Complete by: As directed    Call MD for:  difficulty breathing, headache or visual disturbances   Complete by: As directed    Call MD for:  persistant dizziness or light-headedness   Complete by: As directed    Call MD for:  persistant nausea and vomiting   Complete by: As directed  Diet - low sodium heart healthy   Complete by: As directed    Diet Carb Modified   Complete by: As directed    Discharge instructions   Complete by: As directed    Follow with Primary MD  [name], MD in 1-2 weeks  Please get a complete blood count and chemistry panel checked by your Primary MD at your next visit, and again as instructed by your Primary MD.  Get Medicines reviewed and adjusted: Please take all your medications with you for your next visit with your Primary MD  Laboratory/radiological data: Please request your Primary MD to go over all hospital tests and procedure/radiological results at the follow up, please ask your Primary MD to get all Hospital records sent to his/her office.  In some cases, they will be blood work, cultures and biopsy results pending at the time of your discharge. Please request that your primary care M.D. follows up on these results.  Also Note the following: If you experience worsening of your admission symptoms, develop shortness of breath, life threatening emergency, suicidal or homicidal  thoughts you must seek medical attention immediately by calling 911 or calling your MD immediately  if symptoms less severe.  You must read complete instructions/literature along with all the possible adverse reactions/side effects for all the Medicines you take and that have been prescribed to you. Take any new Medicines after you have completely understood and accpet all the possible adverse reactions/side effects.   Do not drive when taking Pain medications or sleeping medications (Benzodaizepines)  Do not take more than prescribed Pain, Sleep and Anxiety Medications. It is not advisable to combine anxiety,sleep and pain medications without talking with your primary care practitioner  Special Instructions: If you have smoked or chewed Tobacco  in the last 2 yrs please stop smoking, stop any regular Alcohol  and or any Recreational drug use.  Wear Seat belts while driving.  Please note: You were cared for by a hospitalist during your hospital stay. Once you are discharged, your primary care physician will handle any further medical issues. Please note that NO REFILLS for any discharge medications will be authorized once you are discharged, as it is imperative that you return to your primary care physician (or establish a relationship with a primary care physician if you do not have one) for your post hospital discharge needs so that they can reassess your need for medications and monitor your lab values.   Check CBGs before meals and at bedtime   Increase activity slowly   Complete by: As directed      Allergies as of 10/15/2019   No Known Allergies     Medication List    STOP taking these medications   lisinopril 40 MG tablet Commonly known as: ZESTRIL   metoprolol tartrate 50 MG tablet Commonly known as: LOPRESSOR   sacubitril-valsartan 24-26 MG Commonly known as: ENTRESTO   tamsulosin 0.4 MG Caps capsule Commonly known as: FLOMAX     TAKE these medications   acetaminophen  325 MG tablet Commonly known as: TYLENOL Take 2 tablets (650 mg total) by mouth every 6 (six) hours as needed for mild pain (or Fever >/= 101).   albuterol 108 (90 Base) MCG/ACT inhaler Commonly known as: VENTOLIN HFA Inhale 2 puffs into the lungs every 6 (six) hours as needed for wheezing or shortness of breath.   aspirin EC 81 MG tablet Take 81 mg by mouth daily.   cholecalciferol 1000 units tablet Commonly known  as: VITAMIN D Take 1,000 Units by mouth daily.   clopidogrel 75 MG tablet Commonly known as: PLAVIX Take 75 mg by mouth daily.   CRANBERRY EXTRACT PO Take by mouth.   dexamethasone 4 MG tablet Commonly known as: DECADRON Take 1 tablet (4 mg total) by mouth daily for 3 days.   furosemide 40 MG tablet Commonly known as: LASIX Take 1 tablet (40 mg total) by mouth 2 (two) times daily.   gabapentin 100 MG capsule Commonly known as: NEURONTIN Take 100 mg by mouth 2 (two) times daily.   hydrALAZINE 50 MG tablet Commonly known as: APRESOLINE Take 1 tablet (50 mg total) by mouth every 8 (eight) hours.   insulin aspart 100 UNIT/ML injection Commonly known as: novoLOG 0-9 Units, Subcutaneous, 3 times daily with meals CBG < 70: Implement Hypoglycemia protocol/measures CBG 70 - 120: 0 units CBG 121 - 150: 1 unit CBG 151 - 200: 2 units CBG 201 - 250: 3 units CBG 251 - 300: 5 units CBG 301 - 350: 7 units CBG 351 - 400: 9 units CBG > 400: call MD   Ipratropium-Albuterol 20-100 MCG/ACT Aers respimat Commonly known as: COMBIVENT Inhale 2 puffs into the lungs 4 (four) times daily as needed.   isosorbide mononitrate 30 MG 24 hr tablet Commonly known as: IMDUR Take 1 tablet (30 mg total) by mouth daily.   Lantus SoloStar 100 UNIT/ML Solostar Pen Generic drug: Insulin Glargine Inject 45 Units into the skin daily at 10 pm. What changed: how much to take   loperamide 2 MG capsule Commonly known as: IMODIUM Take 4 mg by mouth as needed for diarrhea or loose  stools.   Magnesium 250 MG Tabs Take 250 mg by mouth daily.   metFORMIN 1000 MG tablet Commonly known as: GLUCOPHAGE Take 1,000 mg by mouth 2 (two) times daily with a meal.   multivitamin with minerals Tabs tablet Take 1 tablet by mouth daily.   multivitamin-lutein Caps capsule Take 1 capsule by mouth daily.   omeprazole 20 MG capsule Commonly known as: PRILOSEC Take 20 mg by mouth daily.   potassium chloride 10 MEQ tablet Commonly known as: KLOR-CON Take 10 mEq by mouth daily.   simvastatin 20 MG tablet Commonly known as: ZOCOR Take 20 mg by mouth at bedtime.   vitamin C 500 MG tablet Commonly known as: ASCORBIC ACID Take 500 mg by mouth daily.      Contact information for after-discharge care    [facility] .   Service: Skilled Nursing Contact information: [address] [place] [phone]             No Known Allergies  Consultations:   None   Other Procedures/Studies: DG Chest 2 View  Result Date: 09/26/2019 CLINICAL DATA:  73 year old male with increasing shortness of breath. EXAM: CHEST - 2 VIEW COMPARISON:  Chest radiographs 06/27/2018 and earlier. FINDINGS: Evidence of left lung base fibrothorax on 2017 CT Abdomen and Pelvis. Stable lung volumes. Stable cardiac size and mediastinal contours. Prior CABG. No pneumothorax or pulmonary edema. Chronic left lung base pleural scarring with no evidence of acute pleural effusion. No acute pulmonary opacity identified. No acute osseous abnormality identified. Negative visible bowel gas pattern. IMPRESSION: Chronic left lung base fibrothorax. No acute cardiopulmonary abnormality. Electronically Signed   By: Genevie Ann M.D.   On: 09/26/2019 05:04   DG Chest Port 1 View  Result Date: 10/12/2019 CLINICAL DATA:  73 year old with shortness of breath who is COVID-19 positive. EXAM: PORTABLE CHEST 1 VIEW COMPARISON:  10/11/2019 and earlier. FINDINGS:  Prior sternotomy for CABG. Cardiac silhouette mildly enlarged, unchanged. Mild interstitial pulmonary edema present yesterday has resolved. Pulmonary vascularity now normal. Prominent bronchovascular markings diffusely and central peribronchial thickening, unchanged. No visible confluent or ground-glass airspace opacity currently. Chronic pleuroparenchymal scarring at the LEFT base. IMPRESSION: 1. Resolution of interstitial pulmonary edema since yesterday. 2. Stable COPD/emphysema. No acute cardiopulmonary disease. 3. Stable mild cardiomegaly without pulmonary edema. Electronically Signed   By: Evangeline Dakin M.D.   On: 10/12/2019 08:30   DG Chest Port 1 View  Result Date: 10/11/2019 CLINICAL DATA:  Shortness of breath. EXAM: PORTABLE CHEST 1 VIEW COMPARISON:  Radiograph 09/26/2019 FINDINGS: Post median sternotomy and CABG. Borderline cardiomegaly with unchanged mediastinal contours. Chronic hyperinflation. Chronic pleuroparenchymal scarring at the left lung base. Increased peribronchial thickening from prior exam, possible Kerley B-lines at the bases. No pneumothorax. No acute osseous abnormalities. IMPRESSION: 1. Increased peribronchial thickening from prior exam, may represent acute bronchitis versus pulmonary edema, with possible septal thickening/Kerley B-lines at the right lung base 2. Chronic hyperinflation and pleuroparenchymal scarring at the left lung base. Electronically Signed   By: Keith Rake M.D.   On: 10/11/2019 01:21   ECHOCARDIOGRAM COMPLETE  Result Date: 10/12/2019   ECHOCARDIOGRAM REPORT   Patient Name:   [name] Date of Exam: 10/12/2019 Medical Rec #:  [id number]  Height:       66.0 in Accession #:    [id number] Weight:       174.8 lb Date of Birth:  [birth year]  BSA:          1.89 m Patient Age:    46 years   BP:           156/130 mmHg Patient Gender: M          HR:           61 bpm. Exam Location:  Inpatient Procedure: 2D Echo, Cardiac Doppler and Color Doppler Indications:     Acute respiratory failure.  History:        Patient has no prior history of Echocardiogram examinations.                 CHF, COPD; Risk Factors:Hypertension and Diabetes. Covid 19                 positive. Cancer.  Sonographer:    Roseanna Rainbow RDCS Referring Phys: [place]  Sonographer Comments: Technically difficult study due to poor echo windows and suboptimal apical window. IMPRESSIONS  1. Left ventricular ejection fraction, by visual estimation, is 35 to 40%. The left ventricle has moderately decreased function. There is moderately increased left ventricular hypertrophy.  2. Abnormal septal motion consistent with post-operative status.  3. Indeterminate diastolic filling due to E-A fusion.  4. The left ventricle demonstrates global hypokinesis.  5. Global right ventricle has moderately reduced systolic function.The right ventricular size is mildly enlarged. No increase in right ventricular wall thickness.  6. Left atrial size was normal.  7. Right atrial size was normal.  8. Mild mitral annular calcification.  9. The mitral valve is degenerative. Mild mitral valve regurgitation. 10. The tricuspid valve is grossly normal. Tricuspid valve regurgitation is trivial. 11. The aortic valve has an indeterminant number of cusps. Aortic valve regurgitation is mild. Mild aortic valve stenosis. 12. Mild aortic stenosis. Suspect functional fusion of the RCC/LCC. Likely tricuspid. 13. The pulmonic  valve was grossly normal. Pulmonic valve regurgitation is not visualized. 14. TR signal is inadequate for assessing pulmonary artery systolic pressure. 15. The inferior vena cava is dilated in size with <50% respiratory variability, suggesting right atrial pressure of 15 mmHg. 16. A prior study was performed on 02/16/2019. 17. EF is 35-40%. Prior study at [facility] compared via care everywhere. EF noted to be 40% during that study. Appears unchanged when compared with the report. Direct images from [facility] unable to be viewed.  18. No change from prior study. FINDINGS  Left Ventricle: Left ventricular ejection fraction, by visual estimation, is 35 to 40%. The left ventricle has moderately decreased function. The left ventricle demonstrates global hypokinesis. There is moderately increased left ventricular hypertrophy.  Concentric left ventricular hypertrophy. Abnormal (paradoxical) septal motion consistent with post-operative status. Indeterminate diastolic filling due to E-A fusion. Right Ventricle: The right ventricular size is mildly enlarged. No increase in right ventricular wall thickness. Global RV systolic function is has moderately reduced systolic function. Left Atrium: Left atrial size was normal in size. Right Atrium: Right atrial size was normal in size Pericardium: There is no evidence of pericardial effusion. Mitral Valve: The mitral valve is degenerative in appearance. Mild mitral annular calcification. Mild mitral valve regurgitation. MV peak gradient, 100.0 mmHg. Tricuspid Valve: The tricuspid valve is grossly normal. Tricuspid valve regurgitation is trivial. Aortic Valve: The aortic valve has an indeterminant number of cusps. Aortic valve regurgitation is mild. Mild aortic stenosis is present. Aortic valve mean gradient measures 8.0 mmHg. Aortic valve peak gradient measures 14.1 mmHg. Mild aortic stenosis. Suspect functional fusion of the RCC/LCC. Likely tricuspid. Pulmonic Valve: The pulmonic valve was grossly normal. Pulmonic valve regurgitation is not visualized. Pulmonic regurgitation is not visualized. Aorta: The aortic root is normal in size and structure. Venous: The inferior vena cava is dilated in size with less than 50% respiratory variability, suggesting right atrial pressure of 15 mmHg. IAS/Shunts: No atrial level shunt detected by color flow Doppler. Additional Comments: A prior study was performed on 02/16/2019.  LEFT VENTRICLE PLAX 2D LVIDd:         4.62 cm       Diastology LVIDs:         3.73 cm       LV e'  lateral:   7.07 cm/s LV PW:         1.53 cm       LV E/e' lateral: 10.5 LV IVS:        2.37 cm       LV e' medial:    5.88 cm/s LV SV:         39 ml         LV E/e' medial:  12.6 LV SV Index:   20.12  LV Volumes (MOD) LV area d, A2C:    34.50 cm LV area s, A2C:    30.70 cm LV major d, A2C:   7.08 cm LV major s, A2C:   7.79 cm LV vol d, MOD A2C: 143.0 ml LV vol s, MOD A2C: 106.0 ml LV SV MOD A2C:     37.0 ml RIGHT VENTRICLE            IVC RV S prime:     5.33 cm/s  IVC diam: 2.43 cm TAPSE (M-mode): 1.1 cm LEFT ATRIUM              Index       RIGHT ATRIUM  Index LA diam:        5.20 cm  2.75 cm/m  RA Area:     16.40 cm LA Vol (A2C):   107.0 ml 56.65 ml/m RA Volume:   48.80 ml  25.84 ml/m LA Vol (A4C):   60.5 ml  32.03 ml/m LA Biplane Vol: 81.6 ml  43.21 ml/m  AORTIC VALVE AV Vmax:           187.50 cm/s AV Vmean:          136.000 cm/s AV VTI:            0.432 m AV Peak Grad:      14.1 mmHg AV Mean Grad:      8.0 mmHg LVOT Vmax:         93.10 cm/s LVOT Vmean:        62.200 cm/s LVOT VTI:          0.198 m LVOT/AV VTI ratio: 0.46  AORTA Ao Root diam: 3.30 cm Ao Asc diam:  3.30 cm MITRAL VALVE MV Area (PHT): 4.44 cm             SHUNTS MV Peak grad:  100.0 mmHg           Systemic VTI: 0.20 m MV Mean grad:  74.0 mmHg MV Vmax:       5.00 m/s MV Vmean:      413.0 cm/s MV VTI:        1.67 m MV PHT:        49.59 msec MV Decel Time: 171 msec MR PISA:        1.57 cm MR PISA Radius: 0.50 cm MV E velocity: 73.90 cm/s 103 cm/s MV A velocity: 63.10 cm/s 70.3 cm/s MV E/A ratio:  1.17       1.5  Eleonore Chiquito MD Electronically signed by Eleonore Chiquito MD Signature Date/Time: 10/12/2019/5:14:09 PM    Final    VAS [place] LOWER EXTREMITY VENOUS (DVT)  Result Date: 10/13/2019  Lower Venous Study Indications: Swelling.  Risk Factors: COVID 19. Anticoagulation: Lovenox. Performing Technologist: Darlina Sicilian RDCS  Examination Guidelines: A complete evaluation includes B-mode imaging, spectral Doppler, color Doppler, and  power Doppler as needed of all accessible portions of each vessel. Bilateral testing is considered an integral part of a complete examination. Limited examinations for reoccurring indications may be performed as noted.  +---------+---------------+---------+-----------+----------+--------------+ RIGHT    CompressibilityPhasicitySpontaneityPropertiesThrombus Aging +---------+---------------+---------+-----------+----------+--------------+ CFV      Full           Yes      Yes                                 +---------+---------------+---------+-----------+----------+--------------+ SFJ      Full                                                        +---------+---------------+---------+-----------+----------+--------------+ FV Prox  Full                                                        +---------+---------------+---------+-----------+----------+--------------+ FV Mid  Full                                                        +---------+---------------+---------+-----------+----------+--------------+ FV DistalFull                                                        +---------+---------------+---------+-----------+----------+--------------+ POP      Full                                                        +---------+---------------+---------+-----------+----------+--------------+ PTV      Full                                                        +---------+---------------+---------+-----------+----------+--------------+ PERO     Full           Yes      Yes                                 +---------+---------------+---------+-----------+----------+--------------+   +---------+---------------+---------+-----------+----------+--------------+ LEFT     CompressibilityPhasicitySpontaneityPropertiesThrombus Aging +---------+---------------+---------+-----------+----------+--------------+ CFV      Full           Yes      Yes                                  +---------+---------------+---------+-----------+----------+--------------+ SFJ      Full                                                        +---------+---------------+---------+-----------+----------+--------------+ FV Prox  Full                                                        +---------+---------------+---------+-----------+----------+--------------+ FV Mid   Full                                                        +---------+---------------+---------+-----------+----------+--------------+ FV DistalFull                                                        +---------+---------------+---------+-----------+----------+--------------+  POP      Full           Yes      Yes                                 +---------+---------------+---------+-----------+----------+--------------+ PTV      Full                                                        +---------+---------------+---------+-----------+----------+--------------+ PERO     Full                                                        +---------+---------------+---------+-----------+----------+--------------+     Summary: Right: No evidence of deep vein thrombosis in the lower extremity. No indirect evidence of obstruction proximal to the inguinal ligament. No cystic structure found in the popliteal fossa. Left: No evidence of deep vein thrombosis in the lower extremity. No indirect evidence of obstruction proximal to the inguinal ligament. No cystic structure found in the popliteal fossa.  *See table(s) above for measurements and observations. Electronically signed by Monica Martinez MD on 10/13/2019 at 1:47:38 PM.    Final      TODAY-DAY OF DISCHARGE:  Subjective:   [name] today has no headache,no chest abdominal pain,no new weakness tingling or numbness, feels much better wants to go home today.   Objective:   Blood pressure (!) 166/57, pulse 64, temperature 98.8  F (37.1 C), temperature source Oral, resp. rate 16, height 5\' 6"  (1.676 m), weight 72.2 kg, SpO2 96 %.  Intake/Output Summary (Last 24 hours) at 10/15/2019 1028 Last data filed at 10/15/2019 0600 Gross per 24 hour  Intake --  Output 2350 ml  Net -2350 ml   Filed Weights   10/13/19 0500 10/14/19 0500 10/15/19 0500  Weight: 75 kg 73.9 kg 72.2 kg    Exam: Awake Alert, Oriented *3, No new F.N deficits, Normal affect [place].AT,PERRAL Supple Neck,No JVD, No cervical lymphadenopathy appriciated.  Symmetrical Chest wall movement, Good air movement bilaterally, CTAB RRR,No Gallops,Rubs or new Murmurs, No Parasternal Heave +ve B.Sounds, Abd Soft, Non tender, No organomegaly appriciated, No rebound -guarding or rigidity. No Cyanosis, Clubbing or edema, No new Rash or bruise   PERTINENT RADIOLOGIC STUDIES: DG Chest 2 View  Result Date: 09/26/2019 CLINICAL DATA:  73 year old male with increasing shortness of breath. EXAM: CHEST - 2 VIEW COMPARISON:  Chest radiographs 06/27/2018 and earlier. FINDINGS: Evidence of left lung base fibrothorax on 2017 CT Abdomen and Pelvis. Stable lung volumes. Stable cardiac size and mediastinal contours. Prior CABG. No pneumothorax or pulmonary edema. Chronic left lung base pleural scarring with no evidence of acute pleural effusion. No acute pulmonary opacity identified. No acute osseous abnormality identified. Negative visible bowel gas pattern. IMPRESSION: Chronic left lung base fibrothorax. No acute cardiopulmonary abnormality. Electronically Signed   By: Genevie Ann M.D.   On: 09/26/2019 05:04   DG Chest Port 1 View  Result Date: 10/12/2019 CLINICAL DATA:  73 year old with shortness of breath who is COVID-19 positive. EXAM: PORTABLE CHEST 1 VIEW COMPARISON:  10/11/2019 and earlier. FINDINGS:  Prior sternotomy for CABG. Cardiac silhouette mildly enlarged, unchanged. Mild interstitial pulmonary edema present yesterday has resolved. Pulmonary vascularity now normal.  Prominent bronchovascular markings diffusely and central peribronchial thickening, unchanged. No visible confluent or ground-glass airspace opacity currently. Chronic pleuroparenchymal scarring at the LEFT base. IMPRESSION: 1. Resolution of interstitial pulmonary edema since yesterday. 2. Stable COPD/emphysema. No acute cardiopulmonary disease. 3. Stable mild cardiomegaly without pulmonary edema. Electronically Signed   By: Evangeline Dakin M.D.   On: 10/12/2019 08:30   DG Chest Port 1 View  Result Date: 10/11/2019 CLINICAL DATA:  Shortness of breath. EXAM: PORTABLE CHEST 1 VIEW COMPARISON:  Radiograph 09/26/2019 FINDINGS: Post median sternotomy and CABG. Borderline cardiomegaly with unchanged mediastinal contours. Chronic hyperinflation. Chronic pleuroparenchymal scarring at the left lung base. Increased peribronchial thickening from prior exam, possible Kerley B-lines at the bases. No pneumothorax. No acute osseous abnormalities. IMPRESSION: 1. Increased peribronchial thickening from prior exam, may represent acute bronchitis versus pulmonary edema, with possible septal thickening/Kerley B-lines at the right lung base 2. Chronic hyperinflation and pleuroparenchymal scarring at the left lung base. Electronically Signed   By: Keith Rake M.D.   On: 10/11/2019 01:21   ECHOCARDIOGRAM COMPLETE  Result Date: 10/12/2019   ECHOCARDIOGRAM REPORT   Patient Name:   [name] Date of Exam: 10/12/2019 Medical Rec #:  [id number]  Height:       66.0 in Accession #:    [id number] Weight:       174.8 lb Date of Birth:  [birth year]  BSA:          1.89 m Patient Age:    89 years   BP:           156/130 mmHg Patient Gender: M          HR:           61 bpm. Exam Location:  Inpatient Procedure: 2D Echo, Cardiac Doppler and Color Doppler Indications:    Acute respiratory failure.  History:        Patient has no prior history of Echocardiogram examinations.                 CHF, COPD; Risk Factors:Hypertension and Diabetes.  Covid 19                 positive. Cancer.  Sonographer:    Roseanna Rainbow RDCS Referring Phys: [place]  Sonographer Comments: Technically difficult study due to poor echo windows and suboptimal apical window. IMPRESSIONS  1. Left ventricular ejection fraction, by visual estimation, is 35 to 40%. The left ventricle has moderately decreased function. There is moderately increased left ventricular hypertrophy.  2. Abnormal septal motion consistent with post-operative status.  3. Indeterminate diastolic filling due to E-A fusion.  4. The left ventricle demonstrates global hypokinesis.  5. Global right ventricle has moderately reduced systolic function.The right ventricular size is mildly enlarged. No increase in right ventricular wall thickness.  6. Left atrial size was normal.  7. Right atrial size was normal.  8. Mild mitral annular calcification.  9. The mitral valve is degenerative. Mild mitral valve regurgitation. 10. The tricuspid valve is grossly normal. Tricuspid valve regurgitation is trivial. 11. The aortic valve has an indeterminant number of cusps. Aortic valve regurgitation is mild. Mild aortic valve stenosis. 12. Mild aortic stenosis. Suspect functional fusion of the RCC/LCC. Likely tricuspid. 13. The pulmonic valve was grossly normal. Pulmonic valve regurgitation is not visualized. 14. TR signal is inadequate for assessing pulmonary artery systolic  pressure. 15. The inferior vena cava is dilated in size with <50% respiratory variability, suggesting right atrial pressure of 15 mmHg. 16. A prior study was performed on 02/16/2019. 17. EF is 35-40%. Prior study at [facility] compared via care everywhere. EF noted to be 40% during that study. Appears unchanged when compared with the report. Direct images from [facility] unable to be viewed. 18. No change from prior study. FINDINGS  Left Ventricle: Left ventricular ejection fraction, by visual estimation, is 35 to 40%. The left ventricle has moderately decreased  function. The left ventricle demonstrates global hypokinesis. There is moderately increased left ventricular hypertrophy.  Concentric left ventricular hypertrophy. Abnormal (paradoxical) septal motion consistent with post-operative status. Indeterminate diastolic filling due to E-A fusion. Right Ventricle: The right ventricular size is mildly enlarged. No increase in right ventricular wall thickness. Global RV systolic function is has moderately reduced systolic function. Left Atrium: Left atrial size was normal in size. Right Atrium: Right atrial size was normal in size Pericardium: There is no evidence of pericardial effusion. Mitral Valve: The mitral valve is degenerative in appearance. Mild mitral annular calcification. Mild mitral valve regurgitation. MV peak gradient, 100.0 mmHg. Tricuspid Valve: The tricuspid valve is grossly normal. Tricuspid valve regurgitation is trivial. Aortic Valve: The aortic valve has an indeterminant number of cusps. Aortic valve regurgitation is mild. Mild aortic stenosis is present. Aortic valve mean gradient measures 8.0 mmHg. Aortic valve peak gradient measures 14.1 mmHg. Mild aortic stenosis. Suspect functional fusion of the RCC/LCC. Likely tricuspid. Pulmonic Valve: The pulmonic valve was grossly normal. Pulmonic valve regurgitation is not visualized. Pulmonic regurgitation is not visualized. Aorta: The aortic root is normal in size and structure. Venous: The inferior vena cava is dilated in size with less than 50% respiratory variability, suggesting right atrial pressure of 15 mmHg. IAS/Shunts: No atrial level shunt detected by color flow Doppler. Additional Comments: A prior study was performed on 02/16/2019.  LEFT VENTRICLE PLAX 2D LVIDd:         4.62 cm       Diastology LVIDs:         3.73 cm       LV e' lateral:   7.07 cm/s LV PW:         1.53 cm       LV E/e' lateral: 10.5 LV IVS:        2.37 cm       LV e' medial:    5.88 cm/s LV SV:         39 ml         LV E/e' medial:   12.6 LV SV Index:   20.12  LV Volumes (MOD) LV area d, A2C:    34.50 cm LV area s, A2C:    30.70 cm LV major d, A2C:   7.08 cm LV major s, A2C:   7.79 cm LV vol d, MOD A2C: 143.0 ml LV vol s, MOD A2C: 106.0 ml LV SV MOD A2C:     37.0 ml RIGHT VENTRICLE            IVC RV S prime:     5.33 cm/s  IVC diam: 2.43 cm TAPSE (M-mode): 1.1 cm LEFT ATRIUM              Index       RIGHT ATRIUM           Index LA diam:        5.20 cm  2.75 cm/m  RA  Area:     16.40 cm LA Vol (A2C):   107.0 ml 56.65 ml/m RA Volume:   48.80 ml  25.84 ml/m LA Vol (A4C):   60.5 ml  32.03 ml/m LA Biplane Vol: 81.6 ml  43.21 ml/m  AORTIC VALVE AV Vmax:           187.50 cm/s AV Vmean:          136.000 cm/s AV VTI:            0.432 m AV Peak Grad:      14.1 mmHg AV Mean Grad:      8.0 mmHg LVOT Vmax:         93.10 cm/s LVOT Vmean:        62.200 cm/s LVOT VTI:          0.198 m LVOT/AV VTI ratio: 0.46  AORTA Ao Root diam: 3.30 cm Ao Asc diam:  3.30 cm MITRAL VALVE MV Area (PHT): 4.44 cm             SHUNTS MV Peak grad:  100.0 mmHg           Systemic VTI: 0.20 m MV Mean grad:  74.0 mmHg MV Vmax:       5.00 m/s MV Vmean:      413.0 cm/s MV VTI:        1.67 m MV PHT:        49.59 msec MV Decel Time: 171 msec MR PISA:        1.57 cm MR PISA Radius: 0.50 cm MV E velocity: 73.90 cm/s 103 cm/s MV A velocity: 63.10 cm/s 70.3 cm/s MV E/A ratio:  1.17       1.5  Eleonore Chiquito MD Electronically signed by Eleonore Chiquito MD Signature Date/Time: 10/12/2019/5:14:09 PM    Final    VAS [place] LOWER EXTREMITY VENOUS (DVT)  Result Date: 10/13/2019  Lower Venous Study Indications: Swelling.  Risk Factors: COVID 19. Anticoagulation: Lovenox. Performing Technologist: Darlina Sicilian RDCS  Examination Guidelines: A complete evaluation includes B-mode imaging, spectral Doppler, color Doppler, and power Doppler as needed of all accessible portions of each vessel. Bilateral testing is considered an integral part of a complete examination. Limited examinations for  reoccurring indications may be performed as noted.  +---------+---------------+---------+-----------+----------+--------------+ RIGHT    CompressibilityPhasicitySpontaneityPropertiesThrombus Aging +---------+---------------+---------+-----------+----------+--------------+ CFV      Full           Yes      Yes                                 +---------+---------------+---------+-----------+----------+--------------+ SFJ      Full                                                        +---------+---------------+---------+-----------+----------+--------------+ FV Prox  Full                                                        +---------+---------------+---------+-----------+----------+--------------+ FV Mid   Full                                                        +---------+---------------+---------+-----------+----------+--------------+  FV DistalFull                                                        +---------+---------------+---------+-----------+----------+--------------+ POP      Full                                                        +---------+---------------+---------+-----------+----------+--------------+ PTV      Full                                                        +---------+---------------+---------+-----------+----------+--------------+ PERO     Full           Yes      Yes                                 +---------+---------------+---------+-----------+----------+--------------+   +---------+---------------+---------+-----------+----------+--------------+ LEFT     CompressibilityPhasicitySpontaneityPropertiesThrombus Aging +---------+---------------+---------+-----------+----------+--------------+ CFV      Full           Yes      Yes                                 +---------+---------------+---------+-----------+----------+--------------+ SFJ      Full                                                         +---------+---------------+---------+-----------+----------+--------------+ FV Prox  Full                                                        +---------+---------------+---------+-----------+----------+--------------+ FV Mid   Full                                                        +---------+---------------+---------+-----------+----------+--------------+ FV DistalFull                                                        +---------+---------------+---------+-----------+----------+--------------+ POP      Full           Yes      Yes                                 +---------+---------------+---------+-----------+----------+--------------+  PTV      Full                                                        +---------+---------------+---------+-----------+----------+--------------+ PERO     Full                                                        +---------+---------------+---------+-----------+----------+--------------+     Summary: Right: No evidence of deep vein thrombosis in the lower extremity. No indirect evidence of obstruction proximal to the inguinal ligament. No cystic structure found in the popliteal fossa. Left: No evidence of deep vein thrombosis in the lower extremity. No indirect evidence of obstruction proximal to the inguinal ligament. No cystic structure found in the popliteal fossa.  *See table(s) above for measurements and observations. Electronically signed by Monica Martinez MD on 10/13/2019 at 1:47:38 PM.    Final      PERTINENT LAB RESULTS: CBC: Recent Labs    10/14/19 0020 10/15/19 0056  WBC 9.0 9.3  HGB 15.8 15.9  HCT 49.2 49.6  PLT 298 285   CMET CMP     Component Value Date/Time   NA 136 10/15/2019 0056   NA 136 04/30/2014 0548   K 4.0 10/15/2019 0056   K 3.8 04/30/2014 0548   CL 92 (L) 10/15/2019 0056   CL 105 04/30/2014 0548   CO2 31 10/15/2019 0056   CO2 25 04/30/2014 0548   GLUCOSE 169 (H) 10/15/2019  0056   GLUCOSE 84 04/30/2014 0548   BUN 52 (H) 10/15/2019 0056   BUN 14 04/30/2014 0548   CREATININE 1.82 (H) 10/15/2019 0056   CREATININE 1.36 (H) 04/30/2014 0548   CALCIUM 8.8 (L) 10/15/2019 0056   CALCIUM 8.7 04/30/2014 0548   PROT 6.3 (L) 10/15/2019 0056   PROT 7.7 04/28/2014 2346   ALBUMIN 3.3 (L) 10/15/2019 0056   ALBUMIN 4.0 04/28/2014 2346   AST 20 10/15/2019 0056   AST 10 (L) 04/28/2014 2346   ALT 40 10/15/2019 0056   ALT 17 04/28/2014 2346   ALKPHOS 129 (H) 10/15/2019 0056   ALKPHOS 92 04/28/2014 2346   BILITOT 0.8 10/15/2019 0056   BILITOT 1.0 04/28/2014 2346   GFRNONAA 36 (L) 10/15/2019 0056   GFRNONAA 53 (L) 04/30/2014 0548   GFRAA 41 (L) 10/15/2019 0056   GFRAA >60 04/30/2014 0548    GFR Estimated Creatinine Clearance: 32.1 mL/min (A) (by C-G formula based on SCr of 1.82 mg/dL (H)). No results for input(s): LIPASE, AMYLASE in the last 72 hours. No results for input(s): CKTOTAL, CKMB, CKMBINDEX, TROPONINI in the last 72 hours. Invalid input(s): POCBNP Recent Labs    10/14/19 0020 10/15/19 0056  DDIMER 0.49 0.44   No results for input(s): HGBA1C in the last 72 hours. No results for input(s): CHOL, HDL, LDLCALC, TRIG, CHOLHDL, LDLDIRECT in the last 72 hours. No results for input(s): TSH, T4TOTAL, T3FREE, THYROIDAB in the last 72 hours.  Invalid input(s): FREET3 Recent Labs    10/14/19 0020 10/15/19 0056  FERRITIN 62 55   Coags: No results for input(s): INR in the last 72 hours.  Invalid input(s): PT Microbiology: Recent Results (from  the past 240 hour(s))  Culture, blood (routine x 2)     Status: None (Preliminary result)   Collection Time: 10/11/19 12:21 AM   Specimen: BLOOD  Result Value Ref Range Status   Specimen Description BLOOD RIGTH WRIST  Final   Special Requests   Final    BOTTLES DRAWN AEROBIC AND ANAEROBIC Blood Culture adequate volume   Culture   Final    NO GROWTH 4 DAYS Performed at [facility], [address],  [place], [place] [id number]    Report Status PENDING  Incomplete  Culture, blood (routine x 2)     Status: None (Preliminary result)   Collection Time: 10/11/19 12:21 AM   Specimen: BLOOD  Result Value Ref Range Status   Specimen Description BLOOD LEFT [facility]  Final   Special Requests   Final    BOTTLES DRAWN AEROBIC AND ANAEROBIC Blood Culture adequate volume   Culture   Final    NO GROWTH 4 DAYS Performed at [facility], [address], [place], [place] [address]    Report Status PENDING  Incomplete    FURTHER DISCHARGE INSTRUCTIONS:  Get Medicines reviewed and adjusted: Please take all your medications with you for your next visit with your Primary MD  Laboratory/radiological data: Please request your Primary MD to go over all hospital tests and procedure/radiological results at the follow up, please ask your Primary MD to get all Hospital records sent to his/her office.  In some cases, they will be blood work, cultures and biopsy results pending at the time of your discharge. Please request that your primary care M.D. goes through all the records of your hospital data and follows up on these results.  Also Note the following: If you experience worsening of your admission symptoms, develop shortness of breath, life threatening emergency, suicidal or homicidal thoughts you must seek medical attention immediately by calling 911 or calling your MD immediately  if symptoms less severe.  You must read complete instructions/literature along with all the possible adverse reactions/side effects for all the Medicines you take and that have been prescribed to you. Take any new Medicines after you have completely understood and accpet all the possible adverse reactions/side effects.   Do not drive when taking Pain medications or sleeping medications (Benzodaizepines)  Do not take more than prescribed Pain, Sleep and Anxiety Medications. It is not advisable to combine anxiety,sleep and pain  medications without talking with your primary care practitioner  Special Instructions: If you have smoked or chewed Tobacco  in the last 2 yrs please stop smoking, stop any regular Alcohol  and or any Recreational drug use.  Wear Seat belts while driving.  Please note: You were cared for by a hospitalist during your hospital stay. Once you are discharged, your primary care physician will handle any further medical issues. Please note that NO REFILLS for any discharge medications will be authorized once you are discharged, as it is imperative that you return to your primary care physician (or establish a relationship with a primary care physician if you do not have one) for your post hospital discharge needs so that they can reassess your need for medications and monitor your lab values.  Total Time spent coordinating discharge including counseling, education and face to face time equals 35 minutes.  SignedOren Binet 10/15/2019 10:28 AM

## 2019-10-16 LAB — CULTURE, BLOOD (ROUTINE X 2)
Culture: NO GROWTH
Culture: NO GROWTH
Special Requests: ADEQUATE
Special Requests: ADEQUATE

## 2019-10-17 DIAGNOSIS — U071 COVID-19: Secondary | ICD-10-CM | POA: Diagnosis not present

## 2019-10-17 DIAGNOSIS — I5041 Acute combined systolic (congestive) and diastolic (congestive) heart failure: Secondary | ICD-10-CM | POA: Diagnosis not present

## 2019-10-17 DIAGNOSIS — K219 Gastro-esophageal reflux disease without esophagitis: Secondary | ICD-10-CM | POA: Diagnosis not present

## 2019-10-17 DIAGNOSIS — N184 Chronic kidney disease, stage 4 (severe): Secondary | ICD-10-CM | POA: Diagnosis not present

## 2019-10-17 LAB — GLUCOSE, CAPILLARY: Glucose-Capillary: 249 mg/dL — ABNORMAL HIGH (ref 70–99)

## 2019-10-18 LAB — GLUCOSE, CAPILLARY: Glucose-Capillary: 137 mg/dL — ABNORMAL HIGH (ref 70–99)

## 2019-11-05 DIAGNOSIS — I5041 Acute combined systolic (congestive) and diastolic (congestive) heart failure: Secondary | ICD-10-CM | POA: Diagnosis not present

## 2019-11-05 DIAGNOSIS — N184 Chronic kidney disease, stage 4 (severe): Secondary | ICD-10-CM | POA: Diagnosis not present

## 2019-11-05 DIAGNOSIS — U071 COVID-19: Secondary | ICD-10-CM | POA: Diagnosis not present

## 2019-11-05 DIAGNOSIS — K219 Gastro-esophageal reflux disease without esophagitis: Secondary | ICD-10-CM | POA: Diagnosis not present

## 2019-11-09 DIAGNOSIS — I5023 Acute on chronic systolic (congestive) heart failure: Secondary | ICD-10-CM | POA: Diagnosis not present

## 2019-11-09 DIAGNOSIS — Z951 Presence of aortocoronary bypass graft: Secondary | ICD-10-CM | POA: Diagnosis not present

## 2019-11-09 DIAGNOSIS — E1122 Type 2 diabetes mellitus with diabetic chronic kidney disease: Secondary | ICD-10-CM | POA: Diagnosis not present

## 2019-11-09 DIAGNOSIS — F17210 Nicotine dependence, cigarettes, uncomplicated: Secondary | ICD-10-CM | POA: Diagnosis not present

## 2019-11-09 DIAGNOSIS — R2681 Unsteadiness on feet: Secondary | ICD-10-CM | POA: Diagnosis not present

## 2019-11-09 DIAGNOSIS — J449 Chronic obstructive pulmonary disease, unspecified: Secondary | ICD-10-CM | POA: Diagnosis not present

## 2019-11-09 DIAGNOSIS — I70203 Unspecified atherosclerosis of native arteries of extremities, bilateral legs: Secondary | ICD-10-CM | POA: Diagnosis not present

## 2019-11-09 DIAGNOSIS — N184 Chronic kidney disease, stage 4 (severe): Secondary | ICD-10-CM | POA: Diagnosis not present

## 2019-11-09 DIAGNOSIS — I130 Hypertensive heart and chronic kidney disease with heart failure and stage 1 through stage 4 chronic kidney disease, or unspecified chronic kidney disease: Secondary | ICD-10-CM | POA: Diagnosis not present

## 2019-11-09 DIAGNOSIS — I69398 Other sequelae of cerebral infarction: Secondary | ICD-10-CM | POA: Diagnosis not present

## 2019-11-16 DIAGNOSIS — B9689 Other specified bacterial agents as the cause of diseases classified elsewhere: Secondary | ICD-10-CM | POA: Diagnosis not present

## 2019-11-16 DIAGNOSIS — J019 Acute sinusitis, unspecified: Secondary | ICD-10-CM | POA: Diagnosis not present

## 2019-11-21 DIAGNOSIS — J449 Chronic obstructive pulmonary disease, unspecified: Secondary | ICD-10-CM | POA: Diagnosis not present

## 2019-11-21 DIAGNOSIS — N184 Chronic kidney disease, stage 4 (severe): Secondary | ICD-10-CM | POA: Diagnosis not present

## 2019-11-21 DIAGNOSIS — R2681 Unsteadiness on feet: Secondary | ICD-10-CM | POA: Diagnosis not present

## 2019-11-21 DIAGNOSIS — E1122 Type 2 diabetes mellitus with diabetic chronic kidney disease: Secondary | ICD-10-CM | POA: Diagnosis not present

## 2019-11-21 DIAGNOSIS — I130 Hypertensive heart and chronic kidney disease with heart failure and stage 1 through stage 4 chronic kidney disease, or unspecified chronic kidney disease: Secondary | ICD-10-CM | POA: Diagnosis not present

## 2019-11-21 DIAGNOSIS — I5023 Acute on chronic systolic (congestive) heart failure: Secondary | ICD-10-CM | POA: Diagnosis not present

## 2019-11-21 DIAGNOSIS — I69398 Other sequelae of cerebral infarction: Secondary | ICD-10-CM | POA: Diagnosis not present

## 2019-11-25 DIAGNOSIS — Z794 Long term (current) use of insulin: Secondary | ICD-10-CM | POA: Diagnosis not present

## 2019-11-25 DIAGNOSIS — E1142 Type 2 diabetes mellitus with diabetic polyneuropathy: Secondary | ICD-10-CM | POA: Diagnosis not present

## 2019-11-25 DIAGNOSIS — J439 Emphysema, unspecified: Secondary | ICD-10-CM | POA: Diagnosis not present

## 2019-11-25 DIAGNOSIS — N1832 Chronic kidney disease, stage 3b: Secondary | ICD-10-CM | POA: Diagnosis not present

## 2019-11-25 DIAGNOSIS — I509 Heart failure, unspecified: Secondary | ICD-10-CM | POA: Diagnosis not present

## 2019-11-25 DIAGNOSIS — Z79899 Other long term (current) drug therapy: Secondary | ICD-10-CM | POA: Diagnosis not present

## 2019-12-07 DIAGNOSIS — R2681 Unsteadiness on feet: Secondary | ICD-10-CM | POA: Diagnosis not present

## 2019-12-07 DIAGNOSIS — J449 Chronic obstructive pulmonary disease, unspecified: Secondary | ICD-10-CM | POA: Diagnosis not present

## 2019-12-07 DIAGNOSIS — I130 Hypertensive heart and chronic kidney disease with heart failure and stage 1 through stage 4 chronic kidney disease, or unspecified chronic kidney disease: Secondary | ICD-10-CM | POA: Diagnosis not present

## 2019-12-07 DIAGNOSIS — I5023 Acute on chronic systolic (congestive) heart failure: Secondary | ICD-10-CM | POA: Diagnosis not present

## 2019-12-07 DIAGNOSIS — F17210 Nicotine dependence, cigarettes, uncomplicated: Secondary | ICD-10-CM | POA: Diagnosis not present

## 2019-12-07 DIAGNOSIS — E1122 Type 2 diabetes mellitus with diabetic chronic kidney disease: Secondary | ICD-10-CM | POA: Diagnosis not present

## 2019-12-07 DIAGNOSIS — Z951 Presence of aortocoronary bypass graft: Secondary | ICD-10-CM | POA: Diagnosis not present

## 2019-12-07 DIAGNOSIS — N184 Chronic kidney disease, stage 4 (severe): Secondary | ICD-10-CM | POA: Diagnosis not present

## 2019-12-07 DIAGNOSIS — I69398 Other sequelae of cerebral infarction: Secondary | ICD-10-CM | POA: Diagnosis not present

## 2019-12-07 DIAGNOSIS — I70203 Unspecified atherosclerosis of native arteries of extremities, bilateral legs: Secondary | ICD-10-CM | POA: Diagnosis not present

## 2019-12-26 ENCOUNTER — Other Ambulatory Visit: Payer: Self-pay

## 2019-12-26 ENCOUNTER — Emergency Department: Payer: PPO

## 2019-12-26 ENCOUNTER — Inpatient Hospital Stay
Admission: EM | Admit: 2019-12-26 | Discharge: 2019-12-29 | DRG: 291 | Disposition: A | Discharge status: Home Health | Payer: PPO | Attending: Internal Medicine | Admitting: Internal Medicine

## 2019-12-26 ENCOUNTER — Inpatient Hospital Stay
Admit: 2019-12-26 | Discharge: 2019-12-26 | Disposition: A | Discharge status: Home / Self Care | Payer: PPO | Attending: Internal Medicine | Admitting: Internal Medicine

## 2019-12-26 ENCOUNTER — Encounter: Payer: Self-pay | Admitting: Emergency Medicine

## 2019-12-26 DIAGNOSIS — N179 Acute kidney failure, unspecified: Secondary | ICD-10-CM

## 2019-12-26 DIAGNOSIS — Z79899 Other long term (current) drug therapy: Secondary | ICD-10-CM

## 2019-12-26 DIAGNOSIS — E1165 Type 2 diabetes mellitus with hyperglycemia: Secondary | ICD-10-CM | POA: Diagnosis present

## 2019-12-26 DIAGNOSIS — I25810 Atherosclerosis of coronary artery bypass graft(s) without angina pectoris: Secondary | ICD-10-CM | POA: Diagnosis not present

## 2019-12-26 DIAGNOSIS — R0603 Acute respiratory distress: Secondary | ICD-10-CM | POA: Diagnosis present

## 2019-12-26 DIAGNOSIS — U071 COVID-19: Secondary | ICD-10-CM | POA: Diagnosis not present

## 2019-12-26 DIAGNOSIS — E1122 Type 2 diabetes mellitus with diabetic chronic kidney disease: Secondary | ICD-10-CM | POA: Diagnosis present

## 2019-12-26 DIAGNOSIS — I441 Atrioventricular block, second degree: Secondary | ICD-10-CM | POA: Diagnosis present

## 2019-12-26 DIAGNOSIS — I509 Heart failure, unspecified: Secondary | ICD-10-CM | POA: Diagnosis not present

## 2019-12-26 DIAGNOSIS — I161 Hypertensive emergency: Secondary | ICD-10-CM | POA: Diagnosis not present

## 2019-12-26 DIAGNOSIS — R0902 Hypoxemia: Secondary | ICD-10-CM | POA: Diagnosis not present

## 2019-12-26 DIAGNOSIS — I5023 Acute on chronic systolic (congestive) heart failure: Secondary | ICD-10-CM | POA: Diagnosis present

## 2019-12-26 DIAGNOSIS — I5021 Acute systolic (congestive) heart failure: Secondary | ICD-10-CM | POA: Diagnosis present

## 2019-12-26 DIAGNOSIS — Z8249 Family history of ischemic heart disease and other diseases of the circulatory system: Secondary | ICD-10-CM

## 2019-12-26 DIAGNOSIS — Z6821 Body mass index (BMI) 21.0-21.9, adult: Secondary | ICD-10-CM | POA: Diagnosis not present

## 2019-12-26 DIAGNOSIS — G4730 Sleep apnea, unspecified: Secondary | ICD-10-CM | POA: Diagnosis not present

## 2019-12-26 DIAGNOSIS — I480 Paroxysmal atrial fibrillation: Secondary | ICD-10-CM | POA: Diagnosis not present

## 2019-12-26 DIAGNOSIS — I447 Left bundle-branch block, unspecified: Secondary | ICD-10-CM | POA: Diagnosis not present

## 2019-12-26 DIAGNOSIS — Z801 Family history of malignant neoplasm of trachea, bronchus and lung: Secondary | ICD-10-CM

## 2019-12-26 DIAGNOSIS — Z8673 Personal history of transient ischemic attack (TIA), and cerebral infarction without residual deficits: Secondary | ICD-10-CM

## 2019-12-26 DIAGNOSIS — Z951 Presence of aortocoronary bypass graft: Secondary | ICD-10-CM

## 2019-12-26 DIAGNOSIS — Z7902 Long term (current) use of antithrombotics/antiplatelets: Secondary | ICD-10-CM

## 2019-12-26 DIAGNOSIS — R739 Hyperglycemia, unspecified: Secondary | ICD-10-CM

## 2019-12-26 DIAGNOSIS — E785 Hyperlipidemia, unspecified: Secondary | ICD-10-CM | POA: Diagnosis present

## 2019-12-26 DIAGNOSIS — I5022 Chronic systolic (congestive) heart failure: Secondary | ICD-10-CM | POA: Diagnosis not present

## 2019-12-26 DIAGNOSIS — J441 Chronic obstructive pulmonary disease with (acute) exacerbation: Secondary | ICD-10-CM | POA: Diagnosis not present

## 2019-12-26 DIAGNOSIS — R0689 Other abnormalities of breathing: Secondary | ICD-10-CM | POA: Diagnosis not present

## 2019-12-26 DIAGNOSIS — N183 Chronic kidney disease, stage 3 unspecified: Secondary | ICD-10-CM | POA: Diagnosis not present

## 2019-12-26 DIAGNOSIS — I130 Hypertensive heart and chronic kidney disease with heart failure and stage 1 through stage 4 chronic kidney disease, or unspecified chronic kidney disease: Principal | ICD-10-CM | POA: Diagnosis present

## 2019-12-26 DIAGNOSIS — Z8042 Family history of malignant neoplasm of prostate: Secondary | ICD-10-CM

## 2019-12-26 DIAGNOSIS — F17210 Nicotine dependence, cigarettes, uncomplicated: Secondary | ICD-10-CM | POA: Diagnosis not present

## 2019-12-26 DIAGNOSIS — Z8679 Personal history of other diseases of the circulatory system: Secondary | ICD-10-CM

## 2019-12-26 DIAGNOSIS — E1151 Type 2 diabetes mellitus with diabetic peripheral angiopathy without gangrene: Secondary | ICD-10-CM | POA: Diagnosis present

## 2019-12-26 DIAGNOSIS — I248 Other forms of acute ischemic heart disease: Secondary | ICD-10-CM | POA: Diagnosis not present

## 2019-12-26 DIAGNOSIS — R404 Transient alteration of awareness: Secondary | ICD-10-CM | POA: Diagnosis not present

## 2019-12-26 DIAGNOSIS — Z8616 Personal history of COVID-19: Secondary | ICD-10-CM | POA: Diagnosis not present

## 2019-12-26 DIAGNOSIS — E440 Moderate protein-calorie malnutrition: Secondary | ICD-10-CM | POA: Diagnosis not present

## 2019-12-26 DIAGNOSIS — Z9089 Acquired absence of other organs: Secondary | ICD-10-CM | POA: Diagnosis not present

## 2019-12-26 DIAGNOSIS — R7989 Other specified abnormal findings of blood chemistry: Secondary | ICD-10-CM

## 2019-12-26 DIAGNOSIS — Z85828 Personal history of other malignant neoplasm of skin: Secondary | ICD-10-CM

## 2019-12-26 DIAGNOSIS — N1832 Chronic kidney disease, stage 3b: Secondary | ICD-10-CM | POA: Diagnosis not present

## 2019-12-26 DIAGNOSIS — J9601 Acute respiratory failure with hypoxia: Secondary | ICD-10-CM | POA: Diagnosis not present

## 2019-12-26 DIAGNOSIS — Z8546 Personal history of malignant neoplasm of prostate: Secondary | ICD-10-CM

## 2019-12-26 DIAGNOSIS — I110 Hypertensive heart disease with heart failure: Secondary | ICD-10-CM | POA: Diagnosis not present

## 2019-12-26 DIAGNOSIS — Z794 Long term (current) use of insulin: Secondary | ICD-10-CM

## 2019-12-26 DIAGNOSIS — I252 Old myocardial infarction: Secondary | ICD-10-CM

## 2019-12-26 DIAGNOSIS — I2510 Atherosclerotic heart disease of native coronary artery without angina pectoris: Secondary | ICD-10-CM | POA: Diagnosis not present

## 2019-12-26 DIAGNOSIS — R0602 Shortness of breath: Secondary | ICD-10-CM | POA: Diagnosis not present

## 2019-12-26 DIAGNOSIS — I340 Nonrheumatic mitral (valve) insufficiency: Secondary | ICD-10-CM | POA: Diagnosis not present

## 2019-12-26 DIAGNOSIS — R069 Unspecified abnormalities of breathing: Secondary | ICD-10-CM | POA: Diagnosis not present

## 2019-12-26 DIAGNOSIS — Z803 Family history of malignant neoplasm of breast: Secondary | ICD-10-CM

## 2019-12-26 DIAGNOSIS — Z7982 Long term (current) use of aspirin: Secondary | ICD-10-CM

## 2019-12-26 LAB — CBC WITH DIFFERENTIAL/PLATELET
Abs Immature Granulocytes: 0.16 10*3/uL — ABNORMAL HIGH (ref 0.00–0.07)
Basophils Absolute: 0.1 10*3/uL (ref 0.0–0.1)
Basophils Relative: 1 %
Eosinophils Absolute: 0.2 10*3/uL (ref 0.0–0.5)
Eosinophils Relative: 1 %
HCT: 49 % (ref 39.0–52.0)
Hemoglobin: 15.3 g/dL (ref 13.0–17.0)
Immature Granulocytes: 1 %
Lymphocytes Relative: 37 %
Lymphs Abs: 5.4 10*3/uL — ABNORMAL HIGH (ref 0.7–4.0)
MCH: 28.4 pg (ref 26.0–34.0)
MCHC: 31.2 g/dL (ref 30.0–36.0)
MCV: 90.9 fL (ref 80.0–100.0)
Monocytes Absolute: 0.9 10*3/uL (ref 0.1–1.0)
Monocytes Relative: 6 %
Neutro Abs: 8 10*3/uL — ABNORMAL HIGH (ref 1.7–7.7)
Neutrophils Relative %: 54 %
Platelets: 346 10*3/uL (ref 150–400)
RBC: 5.39 MIL/uL (ref 4.22–5.81)
RDW: 19.3 % — ABNORMAL HIGH (ref 11.5–15.5)
Smear Review: NORMAL
WBC: 14.7 10*3/uL — ABNORMAL HIGH (ref 4.0–10.5)
nRBC: 0 % (ref 0.0–0.2)

## 2019-12-26 LAB — COMPREHENSIVE METABOLIC PANEL
ALT: 37 U/L (ref 0–44)
AST: 62 U/L — ABNORMAL HIGH (ref 15–41)
Albumin: 3.7 g/dL (ref 3.5–5.0)
Alkaline Phosphatase: 166 U/L — ABNORMAL HIGH (ref 38–126)
Anion gap: 13 (ref 5–15)
BUN: 22 mg/dL (ref 8–23)
CO2: 24 mmol/L (ref 22–32)
Calcium: 8.7 mg/dL — ABNORMAL LOW (ref 8.9–10.3)
Chloride: 99 mmol/L (ref 98–111)
Creatinine, Ser: 2.01 mg/dL — ABNORMAL HIGH (ref 0.61–1.24)
GFR calc Af Amer: 37 mL/min — ABNORMAL LOW (ref >60)
GFR calc non Af Amer: 32 mL/min — ABNORMAL LOW (ref >60)
Glucose, Bld: 429 mg/dL — ABNORMAL HIGH (ref 70–99)
Potassium: 3.9 mmol/L (ref 3.5–5.1)
Sodium: 136 mmol/L (ref 135–145)
Total Bilirubin: 0.7 mg/dL (ref 0.3–1.2)
Total Protein: 7.5 g/dL (ref 6.5–8.1)

## 2019-12-26 LAB — HEMOGLOBIN A1C
Hgb A1c MFr Bld: 7.6 % — ABNORMAL HIGH (ref 4.8–5.6)
Mean Plasma Glucose: 171.42 mg/dL

## 2019-12-26 LAB — GLUCOSE, CAPILLARY
Glucose-Capillary: 333 mg/dL — ABNORMAL HIGH (ref 70–99)
Glucose-Capillary: 257 mg/dL — ABNORMAL HIGH (ref 70–99)
Glucose-Capillary: 232 mg/dL — ABNORMAL HIGH (ref 70–99)
Glucose-Capillary: 264 mg/dL — ABNORMAL HIGH (ref 70–99)

## 2019-12-26 LAB — TROPONIN I (HIGH SENSITIVITY)
Troponin I (High Sensitivity): 57 ng/L — ABNORMAL HIGH (ref <18)
Troponin I (High Sensitivity): 89 ng/L — ABNORMAL HIGH (ref <18)

## 2019-12-26 LAB — RESPIRATORY PANEL BY RT PCR (FLU A&B, COVID)
Influenza A by PCR: NEGATIVE
Influenza B by PCR: NEGATIVE
SARS Coronavirus 2 by RT PCR: POSITIVE — AB

## 2019-12-26 LAB — BRAIN NATRIURETIC PEPTIDE: B Natriuretic Peptide: 1145 pg/mL — ABNORMAL HIGH (ref 0.0–100.0)

## 2019-12-26 LAB — PROCALCITONIN: Procalcitonin: 0.11 ng/mL

## 2019-12-26 LAB — LACTIC ACID, PLASMA
Lactic Acid, Venous: 3.1 mmol/L (ref 0.5–1.9)
Lactic Acid, Venous: 2 mmol/L (ref 0.5–1.9)

## 2019-12-26 MED ORDER — CHLORHEXIDINE GLUCONATE CLOTH 2 % EX PADS
6.0000 | MEDICATED_PAD | Freq: Every day | CUTANEOUS | Status: DC
  Administered 2019-12-26: 6 via TOPICAL

## 2019-12-26 MED ORDER — SODIUM CHLORIDE 0.9% FLUSH
3.0000 mL | Freq: Two times a day (BID) | INTRAVENOUS | Status: DC
  Administered 2019-12-26 – 2019-12-28 (×6): 3 mL via INTRAVENOUS

## 2019-12-26 MED ORDER — SODIUM CHLORIDE 0.9 % IV SOLN: 250.0000 mL | INTRAVENOUS | Status: DC | PRN

## 2019-12-26 MED ORDER — INSULIN GLARGINE 100 UNIT/ML ~~LOC~~ SOLN
20.0000 [IU] | Freq: Every day | SUBCUTANEOUS | Status: DC
  Administered 2019-12-26 – 2019-12-28 (×3): 20 [IU] via SUBCUTANEOUS
  Filled 2019-12-26 (×5): qty 0.2

## 2019-12-26 MED ORDER — ACETAMINOPHEN 325 MG PO TABS: 650.0000 mg | ORAL_TABLET | ORAL | Status: DC | PRN

## 2019-12-26 MED ORDER — FUROSEMIDE 10 MG/ML IJ SOLN
40.0000 mg | Freq: Once | INTRAMUSCULAR | Status: AC
  Administered 2019-12-26: 40 mg via INTRAVENOUS
  Filled 2019-12-26: qty 4

## 2019-12-26 MED ORDER — IPRATROPIUM-ALBUTEROL 0.5-2.5 (3) MG/3ML IN SOLN
3.0000 mL | Freq: Three times a day (TID) | RESPIRATORY_TRACT | Status: DC
  Administered 2019-12-26 – 2019-12-29 (×8): 3 mL via RESPIRATORY_TRACT
  Filled 2019-12-26 (×8): qty 3

## 2019-12-26 MED ORDER — IPRATROPIUM-ALBUTEROL 0.5-2.5 (3) MG/3ML IN SOLN
3.0000 mL | Freq: Four times a day (QID) | RESPIRATORY_TRACT | Status: DC
  Administered 2019-12-26 (×2): 3 mL via RESPIRATORY_TRACT
  Filled 2019-12-26 (×2): qty 3

## 2019-12-26 MED ORDER — SIMVASTATIN 20 MG PO TABS
20.0000 mg | ORAL_TABLET | Freq: Every day | ORAL | Status: DC
  Administered 2019-12-26 – 2019-12-28 (×3): 20 mg via ORAL
  Filled 2019-12-26: qty 1
  Filled 2019-12-26: qty 2
  Filled 2019-12-26: qty 1
  Filled 2019-12-26: qty 2

## 2019-12-26 MED ORDER — ISOSORBIDE MONONITRATE ER 30 MG PO TB24
30.0000 mg | ORAL_TABLET | Freq: Every day | ORAL | Status: DC
  Administered 2019-12-26 – 2019-12-29 (×4): 30 mg via ORAL
  Filled 2019-12-26 (×4): qty 1

## 2019-12-26 MED ORDER — PANTOPRAZOLE SODIUM 40 MG PO TBEC
40.0000 mg | DELAYED_RELEASE_TABLET | Freq: Every day | ORAL | Status: DC
  Administered 2019-12-26 – 2019-12-29 (×4): 40 mg via ORAL
  Filled 2019-12-26 (×4): qty 1

## 2019-12-26 MED ORDER — ONDANSETRON HCL 4 MG/2ML IJ SOLN: 4.0000 mg | Freq: Four times a day (QID) | INTRAMUSCULAR | Status: DC | PRN

## 2019-12-26 MED ORDER — NITROGLYCERIN 2 % TD OINT
1.0000 [in_us] | TOPICAL_OINTMENT | Freq: Four times a day (QID) | TRANSDERMAL | Status: DC
  Administered 2019-12-26: 1 [in_us] via TOPICAL
  Filled 2019-12-26: qty 1

## 2019-12-26 MED ORDER — HYDRALAZINE HCL 50 MG PO TABS
50.0000 mg | ORAL_TABLET | Freq: Three times a day (TID) | ORAL | Status: DC
  Administered 2019-12-26 – 2019-12-29 (×10): 50 mg via ORAL
  Filled 2019-12-26 (×10): qty 1

## 2019-12-26 MED ORDER — ALBUTEROL SULFATE HFA 108 (90 BASE) MCG/ACT IN AERS
2.0000 | INHALATION_SPRAY | Freq: Four times a day (QID) | RESPIRATORY_TRACT | Status: DC | PRN
  Filled 2019-12-26: qty 6.7

## 2019-12-26 MED ORDER — IPRATROPIUM-ALBUTEROL 0.5-2.5 (3) MG/3ML IN SOLN
3.0000 mL | Freq: Once | RESPIRATORY_TRACT | Status: AC
  Administered 2019-12-26: 03:00:00 3 mL via RESPIRATORY_TRACT

## 2019-12-26 MED ORDER — INSULIN ASPART 100 UNIT/ML ~~LOC~~ SOLN
0.0000 [IU] | Freq: Every day | SUBCUTANEOUS | Status: DC
  Administered 2019-12-26: 3 [IU] via SUBCUTANEOUS
  Administered 2019-12-27: 2 [IU] via SUBCUTANEOUS
  Filled 2019-12-26 (×2): qty 1

## 2019-12-26 MED ORDER — ASPIRIN EC 81 MG PO TBEC
81.0000 mg | DELAYED_RELEASE_TABLET | Freq: Every day | ORAL | Status: DC
  Administered 2019-12-26 – 2019-12-29 (×4): 81 mg via ORAL
  Filled 2019-12-26 (×4): qty 1

## 2019-12-26 MED ORDER — SODIUM CHLORIDE 0.9 % IV SOLN
1.0000 g | Freq: Once | INTRAVENOUS | Status: AC
  Administered 2019-12-26: 1 g via INTRAVENOUS
  Filled 2019-12-26: qty 10

## 2019-12-26 MED ORDER — GABAPENTIN 100 MG PO CAPS
100.0000 mg | ORAL_CAPSULE | Freq: Two times a day (BID) | ORAL | Status: DC
  Administered 2019-12-26 – 2019-12-29 (×7): 100 mg via ORAL
  Filled 2019-12-26 (×7): qty 1

## 2019-12-26 MED ORDER — VITAMIN D 25 MCG (1000 UNIT) PO TABS
1000.0000 [IU] | ORAL_TABLET | Freq: Every day | ORAL | Status: DC
  Administered 2019-12-26 – 2019-12-29 (×4): 1000 [IU] via ORAL
  Filled 2019-12-26 (×4): qty 1

## 2019-12-26 MED ORDER — CLOPIDOGREL BISULFATE 75 MG PO TABS
75.0000 mg | ORAL_TABLET | Freq: Every day | ORAL | Status: DC
  Administered 2019-12-26 – 2019-12-27 (×2): 75 mg via ORAL
  Filled 2019-12-26 (×3): qty 1

## 2019-12-26 MED ORDER — INSULIN GLARGINE 100 UNIT/ML ~~LOC~~ SOLN
35.0000 [IU] | Freq: Every day | SUBCUTANEOUS | Status: DC
  Filled 2019-12-26: qty 0.35

## 2019-12-26 MED ORDER — POTASSIUM CHLORIDE CRYS ER 10 MEQ PO TBCR
10.0000 meq | EXTENDED_RELEASE_TABLET | Freq: Every day | ORAL | Status: DC
  Administered 2019-12-26 – 2019-12-29 (×4): 10 meq via ORAL
  Filled 2019-12-26 (×4): qty 1

## 2019-12-26 MED ORDER — ALBUTEROL SULFATE (2.5 MG/3ML) 0.083% IN NEBU: 2.5000 mg | INHALATION_SOLUTION | RESPIRATORY_TRACT | Status: DC | PRN

## 2019-12-26 MED ORDER — INSULIN ASPART 100 UNIT/ML ~~LOC~~ SOLN
0.0000 [IU] | Freq: Three times a day (TID) | SUBCUTANEOUS | Status: DC
  Administered 2019-12-26: 8 [IU] via SUBCUTANEOUS
  Administered 2019-12-26: 11 [IU] via SUBCUTANEOUS
  Administered 2019-12-26: 5 [IU] via SUBCUTANEOUS
  Administered 2019-12-27: 8 [IU] via SUBCUTANEOUS
  Administered 2019-12-27: 5 [IU] via SUBCUTANEOUS
  Administered 2019-12-27: 2 [IU] via SUBCUTANEOUS
  Administered 2019-12-28 (×2): 3 [IU] via SUBCUTANEOUS
  Administered 2019-12-28: 2 [IU] via SUBCUTANEOUS
  Administered 2019-12-29: 5 [IU] via SUBCUTANEOUS
  Filled 2019-12-26 (×10): qty 1

## 2019-12-26 MED ORDER — MORPHINE SULFATE (PF) 4 MG/ML IV SOLN
4.0000 mg | Freq: Once | INTRAVENOUS | Status: AC
  Administered 2019-12-26: 4 mg via INTRAVENOUS
  Filled 2019-12-26: qty 1

## 2019-12-26 MED ORDER — ENOXAPARIN SODIUM 40 MG/0.4ML ~~LOC~~ SOLN
40.0000 mg | SUBCUTANEOUS | Status: DC
  Administered 2019-12-26 – 2019-12-28 (×3): 40 mg via SUBCUTANEOUS
  Filled 2019-12-26 (×3): qty 0.4

## 2019-12-26 MED ORDER — SODIUM CHLORIDE 0.9 % IV SOLN
500.0000 mg | Freq: Once | INTRAVENOUS | Status: AC
  Administered 2019-12-26: 500 mg via INTRAVENOUS
  Filled 2019-12-26: qty 500

## 2019-12-26 MED ORDER — SODIUM CHLORIDE 0.9 % IV SOLN
1.0000 g | INTRAVENOUS | Status: DC
  Administered 2019-12-27: 1 g via INTRAVENOUS
  Filled 2019-12-26: qty 1

## 2019-12-26 MED ORDER — FUROSEMIDE 10 MG/ML IJ SOLN
60.0000 mg | Freq: Two times a day (BID) | INTRAMUSCULAR | Status: DC
  Administered 2019-12-26 – 2019-12-27 (×4): 60 mg via INTRAVENOUS
  Filled 2019-12-26 (×4): qty 6

## 2019-12-26 MED ORDER — SODIUM CHLORIDE 0.9% FLUSH
3.0000 mL | INTRAVENOUS | Status: DC | PRN
  Administered 2019-12-26: 09:00:00 3 mL via INTRAVENOUS

## 2019-12-26 MED ORDER — MAGNESIUM OXIDE 400 (241.3 MG) MG PO TABS
200.0000 mg | ORAL_TABLET | Freq: Every day | ORAL | Status: DC
  Administered 2019-12-26 – 2019-12-29 (×4): 200 mg via ORAL
  Filled 2019-12-26 (×4): qty 1

## 2019-12-26 MED ORDER — INSULIN ASPART 100 UNIT/ML ~~LOC~~ SOLN
10.0000 [IU] | Freq: Once | SUBCUTANEOUS | Status: AC
  Administered 2019-12-26: 10 [IU] via SUBCUTANEOUS
  Filled 2019-12-26: qty 1

## 2019-12-26 MED ORDER — METHYLPREDNISOLONE SODIUM SUCC 125 MG IJ SOLR
125.0000 mg | Freq: Once | INTRAMUSCULAR | Status: AC
  Administered 2019-12-26: 125 mg via INTRAVENOUS
  Filled 2019-12-26: qty 2

## 2019-12-26 NOTE — ED Notes (Signed)
Dr. Beather Arbour notified of lactic 3.1.

## 2019-12-26 NOTE — Progress Notes (Signed)
Removed nitro and cleaned area where it was applied due to significant drop in BP 20 min after administration.

## 2019-12-26 NOTE — Progress Notes (Addendum)
Pt up with physical therapy. Pt O2 administration device changed by RT to humidified [place] after 6 hours on HFNC at 6 LPM, down from BIPAP earlier in day.

## 2019-12-26 NOTE — ED Notes (Signed)
First call report

## 2019-12-26 NOTE — ED Notes (Signed)
Pt requests "more oxygen" nods when asked if he feels he isn't breathing deep enough  Dr Beather Arbour notified, RT called to adjust settings

## 2019-12-26 NOTE — H&P (Signed)
History and Physical    [name] PIR:[id number] DOB: 1946-07-25 DOA: 12/26/2019  PCP: [name], MD   Patient coming from: home I have personally briefly reviewed patient's old medical records in [place]  Chief Complaint: acute respiratory distress  HPI: [name] is a 74 y.o. male with medical history significant for static heart failure, last EF 35 to 40% in December 2020, CAD status post CABG,  COPD, CKD 3B, diabetes, CVA, hospitalized in December 2020 for respiratory failure secondary to Covid pneumonia and heart failure exacerbation, in which she had an episode of type I second-degree block was brought in by EMS in severe respiratory distress and on BiPAP.  O2 sats in the field reportedly 60%.  History is limited as patient, while improved with treatment in the emergency room remains on BiPAP.  He was able to endorse that he was in his usual state of health until about 2 AM this morning he awoke suddenly with shortness of breath.  He denies chest pain.  Has a mild cough but denies fever or chills.  ED Course: He arrived in the emergency room on BiPAP at FiO2 40% with O2 sat 100% on those settings, blood pressure was 207/120, HR 134, RR 37.  He was afebrile at 98.5.  EKG showed sinus tachycardia, no acute ST-T wave changes.  Venous pH showed pH of 7.32, PCO2 44 and PO2 173 on aforementioned settings.  Troponin 57, BNP pending at the time of admission.  His blood work white cell count 14,700, creatinine 2.01, close to baseline.  Blood sugar was 429.  He had elevated liver enzymes with AST 62 and alk phos 166.  Lactic acid was 3.1.  Chest x-ray showed mild CHF.  Patient was treated in the ER with Nitropaste, morphine and Lasix with decrease in work of breathing but remained on BiPAP.  Hospitalist consulted for admission.  Review of Systems: Limited due to clinical condition  Past Medical History:  Diagnosis Date  . Basal cell carcinoma 05/2019   right nasal ala, Tx: EDC  .  CHF (congestive heart failure) ([place])   . COPD (chronic obstructive pulmonary disease) ([place])   . Coronary artery disease   . Diabetes mellitus without complication ([place])   . GERD (gastroesophageal reflux disease)   . HLD (hyperlipidemia)   . Hypertension   . Peripheral vascular disease ([place])   . Pneumonia   . Prostate cancer ([place])   . Stroke [facility])     Past Surgical History:  Procedure Laterality Date  . COLONOSCOPY    . CORONARY ARTERY BYPASS GRAFT  2006  . EYE SURGERY    . GOLD SEED IMPLANT N/A 12/30/2016   Procedure: GOLD SEED IMPLANT  x3;  Surgeon: Hollice Espy, MD;  Location: [facility] ORS;  Service: Urology;  Laterality: N/A;  . HEMORRHOID SURGERY N/A 10/02/2016   Procedure: PROCTOSCOPY AND CONTROL OF RECTAL BLEEDING;  Surgeon: Excell Seltzer, MD;  Location: [facility] ORS;  Service: General;  Laterality: N/A;  . PROSTATE BIOPSY    . TONSILLECTOMY       reports that he has been smoking cigarettes. He has a 54.00 pack-year smoking history. He has never used smokeless tobacco. He reports that he does not drink alcohol or use drugs.  No Known Allergies  Family History  Problem Relation Age of Onset  . Lung cancer Mother   . Heart attack Father   . Hypertension Other   . Cancer Maternal Grandfather        prostate  .  Cancer Paternal Grandmother        breast     Prior to Admission medications   Medication Sig Start Date End Date Taking? Authorizing Provider  acetaminophen (TYLENOL) 325 MG tablet Take 2 tablets (650 mg total) by mouth every 6 (six) hours as needed for mild pain (or Fever >/= 101). 01/20/16   Gouru, Illene Silver, MD  albuterol (PROVENTIL HFA;VENTOLIN HFA) 108 (90 Base) MCG/ACT inhaler Inhale 2 puffs into the lungs every 6 (six) hours as needed for wheezing or shortness of breath.    [provider]  aspirin EC 81 MG tablet Take 81 mg by mouth daily.    [provider]  cholecalciferol (VITAMIN D) 1000 units tablet Take 1,000 Units by mouth daily.     [provider]  clopidogrel (PLAVIX) 75 MG tablet Take 75 mg by mouth daily.  08/26/16   [provider]  CRANBERRY EXTRACT PO Take by mouth.    [provider]  furosemide (LASIX) 40 MG tablet Take 1 tablet (40 mg total) by mouth 2 (two) times daily. 06/27/18   Schuyler Amor, MD  gabapentin (NEURONTIN) 100 MG capsule Take 100 mg by mouth 2 (two) times daily.    [provider]  hydrALAZINE (APRESOLINE) 50 MG tablet Take 1 tablet (50 mg total) by mouth every 8 (eight) hours. 10/15/19   Ghimire, Henreitta Leber, MD  insulin aspart (NOVOLOG) 100 UNIT/ML injection 0-9 Units, Subcutaneous, 3 times daily with meals CBG < 70: Implement Hypoglycemia protocol/measures CBG 70 - 120: 0 units CBG 121 - 150: 1 unit CBG 151 - 200: 2 units CBG 201 - 250: 3 units CBG 251 - 300: 5 units CBG 301 - 350: 7 units CBG 351 - 400: 9 units CBG > 400: call MD 10/15/19   Jonetta Osgood, MD  Ipratropium-Albuterol (COMBIVENT) 20-100 MCG/ACT AERS respimat Inhale 2 puffs into the lungs 4 (four) times daily as needed. 09/08/19 09/07/20  [provider]  isosorbide mononitrate (IMDUR) 30 MG 24 hr tablet Take 1 tablet (30 mg total) by mouth daily. 10/15/19   Ghimire, Henreitta Leber, MD  LANTUS SOLOSTAR 100 UNIT/ML Solostar Pen Inject 45 Units into the skin daily at 10 pm. 10/15/19   Ghimire, Henreitta Leber, MD  loperamide (IMODIUM) 2 MG capsule Take 4 mg by mouth as needed for diarrhea or loose stools.    [provider]  Magnesium 250 MG TABS Take 250 mg by mouth daily.    [provider]  metFORMIN (GLUCOPHAGE) 1000 MG tablet Take 1,000 mg by mouth 2 (two) times daily with a meal.    [provider]  metoprolol tartrate (LOPRESSOR) 50 MG tablet Take 50 mg by mouth 2 (two) times daily. 11/15/19   [provider]  Multiple Vitamin (MULTIVITAMIN WITH MINERALS) TABS tablet Take 1 tablet by mouth daily.    [provider]  multivitamin-lutein  (OCUVITE-LUTEIN) CAPS capsule Take 1 capsule by mouth daily.    [provider]  omeprazole (PRILOSEC) 20 MG capsule Take 20 mg by mouth daily.     [provider]  potassium chloride (K-DUR) 10 MEQ tablet Take 10 mEq by mouth daily.    [provider]  simvastatin (ZOCOR) 20 MG tablet Take 20 mg by mouth at bedtime.    [provider]  vitamin C (ASCORBIC ACID) 500 MG tablet Take 500 mg by mouth daily.    [provider]    Physical Exam: Vitals:   12/26/19 4034 12/26/19  0430 12/26/19 0445 12/26/19 0500  BP: 123/73 120/64 119/73 109/73  Pulse: (!) 102 100 99 97  Resp: 18 (!) 26 18 (!) 33  Temp:      TempSrc:      SpO2: 97% 99% 100% 100%  Weight:      Height:         Vitals:   12/26/19 0415 12/26/19 0430 12/26/19 0445 12/26/19 0500  BP: 123/73 120/64 119/73 109/73  Pulse: (!) 102 100 99 97  Resp: 18 (!) 26 18 (!) 33  Temp:      TempSrc:      SpO2: 97% 99% 100% 100%  Weight:      Height:        Constitutional: NAD, alert and oriented x 3 Eyes: PERRL, lids and conjunctivae normal ENMT: Mucous membranes are moist.  Neck: normal, supple, no masses, no thyromegaly Respiratory: bilateral rales, few wheezes. Mildly increasedrespiratory effort. Tachypnea Cardiovascular: tachycardic, no murmurs / rubs / gallops. No extremity edema. 2+ pedal pulses. No carotid bruits.  Abdomen: no tenderness, no masses palpated. No hepatosplenomegaly. Bowel sounds positive.  Musculoskeletal: no clubbing / cyanosis. No joint deformity upper and lower extremities.  Skin: no rashes, lesions, ulcers.  Neurologic: No gross focal neurologic deficit. Psychiatric: Normal mood and affect.   Labs on Admission: I have personally reviewed following labs and imaging studies  CBC: Recent Labs  Lab 12/26/19 0312  WBC 14.7*  NEUTROABS 8.0*  HGB 15.3  HCT 49.0  MCV 90.9  PLT 952   Basic Metabolic Panel: Recent Labs  Lab 12/26/19 0312  NA 136  K 3.9   CL 99  CO2 24  GLUCOSE 429*  BUN 22  CREATININE 2.01*  CALCIUM 8.7*   GFR: Estimated Creatinine Clearance: 34.3 mL/min (A) (by C-G formula based on SCr of 2.01 mg/dL (H)). Liver Function Tests: Recent Labs  Lab 12/26/19 0312  AST 62*  ALT 37  ALKPHOS 166*  BILITOT 0.7  PROT 7.5  ALBUMIN 3.7   No results for input(s): LIPASE, AMYLASE in the last 168 hours. No results for input(s): AMMONIA in the last 168 hours. Coagulation Profile: No results for input(s): INR, PROTIME in the last 168 hours. Cardiac Enzymes: No results for input(s): CKTOTAL, CKMB, CKMBINDEX, TROPONINI in the last 168 hours. BNP (last 3 results) No results for input(s): PROBNP in the last 8760 hours. HbA1C: No results for input(s): HGBA1C in the last 72 hours. CBG: No results for input(s): GLUCAP in the last 168 hours. Lipid Profile: No results for input(s): CHOL, HDL, LDLCALC, TRIG, CHOLHDL, LDLDIRECT in the last 72 hours. Thyroid Function Tests: No results for input(s): TSH, T4TOTAL, FREET4, T3FREE, THYROIDAB in the last 72 hours. Anemia Panel: No results for input(s): VITAMINB12, FOLATE, FERRITIN, TIBC, IRON, RETICCTPCT in the last 72 hours. Urine analysis:    Component Value Date/Time   COLORURINE YELLOW (A) 01/15/2016 2115   APPEARANCEUR CLEAR (A) 01/15/2016 2115   APPEARANCEUR Clear 04/28/2014 2346   LABSPEC 1.010 01/15/2016 2115   LABSPEC 1.009 04/28/2014 2346   PHURINE 5.0 01/15/2016 2115   GLUCOSEU NEGATIVE 01/15/2016 2115   GLUCOSEU Negative 04/28/2014 2346   HGBUR 1+ (A) 01/15/2016 2115   BILIRUBINUR NEGATIVE 01/15/2016 2115   BILIRUBINUR Negative 04/28/2014 2346   [place] 01/15/2016 2115   PROTEINUR 100 (A) 01/15/2016 2115   NITRITE NEGATIVE 01/15/2016 2115   LEUKOCYTESUR TRACE (A) 01/15/2016 2115   LEUKOCYTESUR 1+ 04/28/2014 2346    Radiological Exams on Admission: DG Chest Port 1  View  Result Date: 12/26/2019 CLINICAL DATA:  Shortness of breath for 1 hour EXAM:  PORTABLE CHEST 1 VIEW COMPARISON:  10/12/2019 FINDINGS: Single frontal view of the chest demonstrates stable postsurgical changes from median sternotomy. The cardiac silhouette is unchanged. There is increased central vascular congestion with interval development of diffuse interstitial prominence and patchy bilateral perihilar airspace disease. Small left pleural effusion is noted. No pneumothorax. IMPRESSION: 1. Findings consistent with mild congestive heart failure.  To Electronically Signed   By: Randa Ngo M.D.   On: 12/26/2019 03:36    EKG: Independently reviewed.   Assessment/Plan Principal Problem:   Acute respiratory failure with hypoxia ([place]) -More likely related to her heart failure exacerbation but with some component of COPD exacerbation -Patient does have history of Covid with pneumonia respiratory failure in 10/2019 from which he had recovered Covid so not believed to be coughing this new episode.  The test remains positive -O2 sat reportedly 60% in the field.  Arterial pH was 7.32, PCO2 44 with PO2 173 on FiO2 40% -Continue BiPAP for now and wean to O2 via nasal cannula as tolerated -Treat related conditions as outlined below    Acute systolic CHF (congestive heart failure) (HCC)   Hypertensive emergency   Chronic systolic heart failure ([place])   History of second degree heart block --Blood pressure on arrival was 270/120 and patient is in severe respiratory distress requiring BiPAP.  Chest x-ray showed pulmonary vascular congestion.  BNP pending -Continue IV Lasix, Nitropaste -Continue to trend troponins, initial was 57 and possibly related to demand ischemia as no complaints of chest pain no ischemic changes on EKG -No ACE inhibitor due to poor renal function, patient not currently on beta-blocker, likely related to history of second-degree heart block type I -Strict intake output monitoring, daily weights and salt and fluid restriction -Continue home hydralazine and  isosorbide once meds reconciled(can remove Nitropaste at that time if improved)    COPD with acute exacerbation (HCC) -Continue duo nebs scheduled and with as needed albuterol  -Continue Rocephin and azithromycin -Follow-up procalcitonin ordered from ER  Elevated troponin patient with history of CAD status post CABG -First troponin elevated at 57 but patient has no chest pain and no acute ischemic changes on EKG -Continue to trend troponins -Continue aspirin, Plavix, simvastatin.  Not currently on beta-blocker related to history of heart block   Hyperglycemia due to type 2 diabetes mellitus (HCC) -Blood sugar was 429 -Continue sliding scale insulin for now.    Chronic kidney disease (CKD) stage G3b -At baseline    History of CVA (cerebrovascular accident) -Continue aspirin, Plavix and simvastatin     Elevated LFTs -Suspect related to hepatic congestion from heart failure exacerbation      DVT prophylaxis: lovenox Code Status: full code  Family Communication: none Disposition Plan: Back to previous home environment Consults called: none     Athena Masse MD [facility]     12/26/2019, 5:35 AM

## 2019-12-26 NOTE — Evaluation (Signed)
Physical Therapy Evaluation Patient Details Name: [name] MRN: [id number] DOB: 07-02-1946 Today's Date: 12/26/2019   History of Present Illness  presented to ER secondary to severe respiratory distress, requiring BiPAP initially; admitted for management of acute respiratory failure related to hypoxia, CHF exacerbation.  Clinical Impression  Upon evaluation, patient alert and oriented; follows commands and demonstrates good effort with mobility tasks.  Eager fot OOB to chair as tolerated.  Bilat UE/LE strength and ROM grossly symmetrical and WFL; no focal weakness appreciated.  Able to complete bed mobility with min assist; sit/stand, basic transfers and gait (5') with RW, cga/min assist.  Demonstrates decreased step height/length, forward flexed posture, decreased dynamic balance; do recommend continued use of RW at this time.  Sats >98% on 4L with transfer; additional distance limited by scheduled breathing treatment.  Will continue to assess/progress gait distance in subsequent sessions as appropriate. Would benefit from skilled PT to address above deficits and promote optimal return to PLOF.; Recommend transition to HHPT upon discharge from acute hospitalization.     Follow Up Recommendations Home health PT    Equipment Recommendations       Recommendations for Other Services       Precautions / Restrictions Precautions Precautions: Fall Restrictions Weight Bearing Restrictions: No      Mobility  Bed Mobility Overal bed mobility: Needs Assistance Bed Mobility: Supine to Sit     Supine to sit: Min assist     General bed mobility comments: for truncal elevation  Transfers Overall transfer level: Needs assistance Equipment used: Rolling walker (2 wheeled) Transfers: Sit to/from Stand Sit to Stand: Min assist;Min guard         General transfer comment: cuing for hand placement to prevent pulling on RW  Ambulation/Gait Ambulation/Gait assistance: Min guard;Min  assist Gait Distance (Feet): 5 Feet Assistive device: Rolling walker (2 wheeled)       General Gait Details: decreased step height/length, forward flexed posture, decreased dynamic balance; do recommend continued use of RW at this time.  Sats >98% on 4L with transfer; additional distance limited by scheduled breathing treatment.  Stairs            Wheelchair Mobility    Modified Rankin (Stroke Patients Only)       Balance Overall balance assessment: Needs assistance Sitting-balance support: No upper extremity supported;Feet supported Sitting balance-Leahy Scale: Good     Standing balance support: Bilateral upper extremity supported Standing balance-Leahy Scale: Fair                               Pertinent Vitals/Pain Pain Assessment: No/denies pain    Home Living Family/patient expects to be discharged to:: Private residence Living Arrangements: Spouse/significant other Available Help at Discharge: Family Type of Home: House Home Access: Stairs to enter   Technical brewer of Steps: 2 Home Layout: One level Home Equipment: Shower seat;Cane - single point;Walker - 4 wheels;Wheelchair - manual      Prior Function           Comments: Mod indep with ADLs, household and community mobilization; intermittent use of SPC/RW as needed.  Per chart, at least 2 falls within previous six months.  No home O2.  Wife available to support/assist patient as needed.     Hand Dominance        Extremity/Trunk Assessment   Upper Extremity Assessment Upper Extremity Assessment: Overall WFL for tasks assessed    Lower Extremity Assessment Lower  Extremity Assessment: Overall WFL for tasks assessed(grossly at least 4/5 throughout)       Communication   Communication: No difficulties  Cognition Arousal/Alertness: Awake/alert Behavior During Therapy: WFL for tasks assessed/performed Overall Cognitive Status: Within Functional Limits for tasks assessed                                         General Comments      Exercises     Assessment/Plan    PT Assessment Patient needs continued PT services  PT Problem List Decreased activity tolerance;Decreased balance;Decreased mobility;Decreased safety awareness;Decreased knowledge of precautions;Cardiopulmonary status limiting activity;Decreased knowledge of use of DME       PT Treatment Interventions DME instruction;Gait training;Stair training;Functional mobility training;Therapeutic activities;Therapeutic exercise;Balance training;Patient/family education    PT Goals (Current goals can be found in the Care Plan section)  Acute Rehab PT Goals Patient Stated Goal: to get up to recliner, "wouldn't hurt my feelings" PT Goal Formulation: With patient/family Time For Goal Achievement: 01/09/20 Potential to Achieve Goals: Good    Frequency Min 2X/week   Barriers to discharge Decreased caregiver support      Co-evaluation               AM-PAC PT "6 Clicks" Mobility  Outcome Measure Help needed turning from your back to your side while in a flat bed without using bedrails?: None Help needed moving from lying on your back to sitting on the side of a flat bed without using bedrails?: A Little Help needed moving to and from a bed to a chair (including a wheelchair)?: A Little Help needed standing up from a chair using your arms (e.g., wheelchair or bedside chair)?: A Little Help needed to walk in hospital room?: A Little Help needed climbing 3-5 steps with a railing? : A Little 6 Click Score: 19    End of Session Equipment Utilized During Treatment: Oxygen Activity Tolerance: Patient tolerated treatment well Patient left: in chair;with call bell/phone within reach;with family/visitor present;with nursing/sitter in room Nurse Communication: Mobility status PT Visit Diagnosis: Muscle weakness (generalized) (M62.81);Difficulty in walking, not elsewhere classified  (R26.2)    Time: 9323-5573 PT Time Calculation (min) (ACUTE ONLY): 17 min   Charges:   PT Evaluation $PT Eval Moderate Complexity: 1 Mod         Tyheem Boughner H. Owens Shark, PT, DPT, NCS 12/26/19, 3:18 PM [phone]

## 2019-12-26 NOTE — ED Provider Notes (Addendum)
[facility] Emergency Department Provider Note   ____________________________________________   First MD Initiated Contact with Patient 12/26/19 [phone]     (approximate)  I have reviewed the triage vital signs and the nursing notes.   HISTORY  Chief Complaint Respiratory Distress  Level V caveat: Limited by respiratory distress  HPI [name] is a 74 y.o. male brought to the ED via EMS from home with a chief complaint of respiratory distress.  Patient has a history of CHF, COPD, not on home oxygen who reports sudden onset shortness of breath prior to arrival.  EMS reports room air saturations in the 60%.  Patient arrives to the ED diaphoretic, tachypneic, tachycardic on CPAP.  Nitroglycerin paste applied prior to arrival.  Confirms he was hospitalized for COVID-19 and treated with Remdesivir in December.  Denies current fever, cough or chest pain.  Rest of history is limited secondary to respiratory distress.       Past Medical History:  Diagnosis Date  . Basal cell carcinoma 05/2019   right nasal ala, Tx: EDC  . CHF (congestive heart failure) ([place])   . COPD (chronic obstructive pulmonary disease) ([place])   . Coronary artery disease   . Diabetes mellitus without complication ([place])   . GERD (gastroesophageal reflux disease)   . HLD (hyperlipidemia)   . Hypertension   . Peripheral vascular disease ([place])   . Pneumonia   . Prostate cancer ([place])   . Stroke [facility])     Patient Active Problem List   Diagnosis Date Noted  . Hypertensive emergency 12/26/2019  . Hyperglycemia due to type 2 diabetes mellitus ([place]) 12/26/2019  . Chronic kidney disease (CKD) stage G3b/A3, moderately decreased glomerular filtration rate (GFR) between 30-44 mL/min/1.73 square meter and albuminuria creatinine ratio greater than 300 mg/g 12/26/2019  . History of COVID-19 12/26/2019  . History of CVA (cerebrovascular accident) 12/26/2019  . 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 History of second  degree heart block 12/26/2019  . Elevated LFTs 12/26/2019  . Pneumonia due to COVID-19 virus   . Acute systolic CHF (congestive heart failure) ([place]) 10/11/2019  . Malignant neoplasm of prostate ([place]) 11/24/2016  . Bright red rectal bleeding 10/02/2016  . COPD with acute exacerbation ([place]) 01/17/2016  . Chronic systolic heart failure ([place]) 12/12/2015  . Essential hypertension 12/12/2015  . COPD (chronic obstructive pulmonary disease) with chronic bronchitis ([place]) 12/12/2015  . Diabetes ([place]) 12/12/2015  . Tobacco abuse 12/12/2015  . Acute respiratory failure with hypoxia ([place]) 12/03/2015    Past Surgical History:  Procedure Laterality Date  . COLONOSCOPY    . CORONARY ARTERY BYPASS GRAFT  2006  . EYE SURGERY    . GOLD SEED IMPLANT N/A 12/30/2016   Procedure: GOLD SEED IMPLANT  x3;  Surgeon: Hollice Espy, MD;  Location: [facility] ORS;  Service: Urology;  Laterality: N/A;  . HEMORRHOID SURGERY N/A 10/02/2016   Procedure: PROCTOSCOPY AND CONTROL OF RECTAL BLEEDING;  Surgeon: Excell Seltzer, MD;  Location: [facility] ORS;  Service: General;  Laterality: N/A;  . PROSTATE BIOPSY    . TONSILLECTOMY      Prior to Admission medications   Medication Sig Start Date End Date Taking? Authorizing Provider  acetaminophen (TYLENOL) 325 MG tablet Take 2 tablets (650 mg total) by mouth every 6 (six) hours as needed for mild pain (or Fever >/= 101). 01/20/16   Gouru, Illene Silver, MD  albuterol (PROVENTIL HFA;VENTOLIN HFA) 108 (90 Base) MCG/ACT inhaler Inhale 2 puffs into the lungs every 6 (six) hours  as needed for wheezing or shortness of breath.    [provider]  aspirin EC 81 MG tablet Take 81 mg by mouth daily.    [provider]  cholecalciferol (VITAMIN D) 1000 units tablet Take 1,000 Units by mouth daily.    [provider]  clopidogrel (PLAVIX) 75 MG tablet Take 75 mg by mouth daily.  08/26/16   [provider]  CRANBERRY EXTRACT PO Take by mouth.    [provider]  furosemide (LASIX) 40 MG tablet Take 1 tablet (40 mg total) by mouth 2 (two) times daily. 06/27/18   Schuyler Amor, MD  gabapentin (NEURONTIN) 100 MG capsule Take 100 mg by mouth 2 (two) times daily.    [provider]  hydrALAZINE (APRESOLINE) 50 MG tablet Take 1 tablet (50 mg total) by mouth every 8 (eight) hours. 10/15/19   Ghimire, Henreitta Leber, MD  insulin aspart (NOVOLOG) 100 UNIT/ML injection 0-9 Units, Subcutaneous, 3 times daily with meals CBG < 70: Implement Hypoglycemia protocol/measures CBG 70 - 120: 0 units CBG 121 - 150: 1 unit CBG 151 - 200: 2 units CBG 201 - 250: 3 units CBG 251 - 300: 5 units CBG 301 - 350: 7 units CBG 351 - 400: 9 units CBG > 400: call MD 10/15/19   Jonetta Osgood, MD  Ipratropium-Albuterol (COMBIVENT) 20-100 MCG/ACT AERS respimat Inhale 2 puffs into the lungs 4 (four) times daily as needed. 09/08/19 09/07/20  [provider]  isosorbide mononitrate (IMDUR) 30 MG 24 hr tablet Take 1 tablet (30 mg total) by mouth daily. 10/15/19   Ghimire, Henreitta Leber, MD  LANTUS SOLOSTAR 100 UNIT/ML Solostar Pen Inject 45 Units into the skin daily at 10 pm. 10/15/19   Ghimire, Henreitta Leber, MD  loperamide (IMODIUM) 2 MG capsule Take 4 mg by mouth as needed for diarrhea or loose stools.    [provider]  Magnesium 250 MG TABS Take 250 mg by mouth daily.    [provider]  metFORMIN (GLUCOPHAGE) 1000 MG tablet Take 1,000 mg by mouth 2 (two) times daily with a meal.    [provider]  metoprolol tartrate (LOPRESSOR) 50 MG tablet Take 50 mg by mouth 2 (two) times daily. 11/15/19   [provider]  Multiple Vitamin (MULTIVITAMIN WITH MINERALS) TABS tablet Take 1 tablet by mouth daily.    [provider]  multivitamin-lutein (OCUVITE-LUTEIN) CAPS capsule Take 1 capsule by mouth daily.    [provider]  omeprazole (PRILOSEC) 20 MG capsule Take 20 mg by mouth daily.     [provider]  potassium  chloride (K-DUR) 10 MEQ tablet Take 10 mEq by mouth daily.    [provider]  simvastatin (ZOCOR) 20 MG tablet Take 20 mg by mouth at bedtime.    [provider]  vitamin C (ASCORBIC ACID) 500 MG tablet Take 500 mg by mouth daily.    [provider]    Allergies Patient has no known allergies.  Family History  Problem Relation Age of Onset  . Lung cancer Mother   . Heart attack Father   . Hypertension Other   . Cancer Maternal Grandfather        prostate  . Cancer Paternal Grandmother        breast    Social History Social History   Tobacco Use  . Smoking status: Current Every Day Smoker    Packs/day: 1.00    Years: 54.00    Pack  years: 54.00    Types: Cigarettes  . Smokeless tobacco: Never Used  Substance Use Topics  . Alcohol use: No    Alcohol/week: 0.0 standard drinks  . Drug use: No    Review of Systems  Constitutional: No fever/chills Eyes: No visual changes. ENT: No sore throat. Cardiovascular: Denies chest pain. Respiratory: Positive for shortness of breath. Gastrointestinal: No abdominal pain.  No nausea, no vomiting.  No diarrhea.  No constipation. Genitourinary: Negative for dysuria. Musculoskeletal: Negative for back pain. Skin: Negative for rash. Neurological: Negative for headaches, focal weakness or numbness.   ____________________________________________   PHYSICAL EXAM:  VITAL SIGNS: ED Triage Vitals  Enc Vitals Group     BP      Pulse      Resp      Temp      Temp src      SpO2      Weight      Height      Head Circumference      Peak Flow      Pain Score      Pain Loc      Pain Edu?      Excl. in [place]?     Constitutional: Alert and oriented.  Ill appearing and in moderate acute distress. Eyes: Conjunctivae are normal. PERRL. EOMI. Head: Atraumatic. Nose: No congestion/rhinnorhea. Mouth/Throat: Mucous membranes are moist.  Oropharynx non-erythematous. Neck: No stridor.   Cardiovascular:  Tachycardic rate, regular rhythm. Grossly normal heart sounds.  Good peripheral circulation. Respiratory: Increased respiratory effort.  Retractions. Lungs with audible rales. Gastrointestinal: Soft and nontender. No distention. No abdominal bruits. No CVA tenderness. Musculoskeletal: No lower extremity tenderness nor edema.  No joint effusions. Neurologic:  Normal speech and language. No gross focal neurologic deficits are appreciated.  Skin:  Skin is warm, diaphorectic and intact. No rash noted. Psychiatric: Mood and affect are normal. Speech and behavior are normal.  ____________________________________________   LABS (all labs ordered are listed, but only abnormal results are displayed)  Labs Reviewed  RESPIRATORY PANEL BY RT PCR (FLU A&B, COVID) - Abnormal; Notable for the following components:      Result Value   SARS Coronavirus 2 by RT PCR POSITIVE (*)    All other components within normal limits  LACTIC ACID, PLASMA - Abnormal; Notable for the following components:   Lactic Acid, Venous 3.1 (*)    All other components within normal limits  LACTIC ACID, PLASMA - Abnormal; Notable for the following components:   Lactic Acid, Venous 2.0 (*)    All other components within normal limits  CBC WITH DIFFERENTIAL/PLATELET - Abnormal; Notable for the following components:   WBC 14.7 (*)    RDW 19.3 (*)    Neutro Abs 8.0 (*)    Lymphs Abs 5.4 (*)    Abs Immature Granulocytes 0.16 (*)    All other components within normal limits  COMPREHENSIVE METABOLIC PANEL - Abnormal; Notable for the following components:   Glucose, Bld 429 (*)    Creatinine, Ser 2.01 (*)    Calcium 8.7 (*)    AST 62 (*)    Alkaline Phosphatase 166 (*)    GFR calc non Af Amer 32 (*)    GFR calc Af Amer 37 (*)    All other components within normal limits  BLOOD GAS, ARTERIAL - Abnormal; Notable for the following components:   pH, Arterial 7.32 (*)    pO2, Arterial 173 (*)    Acid-base deficit 3.5 (*)  All  other components within normal limits  TROPONIN I (HIGH SENSITIVITY) - Abnormal; Notable for the following components:   Troponin I (High Sensitivity) 57 (*)    All other components within normal limits  CULTURE, BLOOD (ROUTINE X 2)  CULTURE, BLOOD (ROUTINE X 2)  CULTURE, BLOOD (ROUTINE X 2)  CULTURE, BLOOD (ROUTINE X 2)  PROCALCITONIN  BRAIN NATRIURETIC PEPTIDE  HEMOGLOBIN A1C  HIV ANTIBODY (ROUTINE TESTING W REFLEX)  CBC  TROPONIN I (HIGH SENSITIVITY)   ____________________________________________  EKG  ED ECG REPORT I, Estalee Mccandlish J, the attending physician, personally viewed and interpreted this ECG.   Date: 12/26/2019  EKG Time: 0309  Rate: 140  Rhythm: sinus tachycardia  Axis: LAD  Intervals:left bundle branch block  ST&T Change: ST depression inferior laterally No significant change from 10/14/2019  ____________________________________________  RADIOLOGY  ED MD interpretation: CHF  Official radiology report(s): DG Chest Port 1 View  Result Date: 12/26/2019 CLINICAL DATA:  Shortness of breath for 1 hour EXAM: PORTABLE CHEST 1 VIEW COMPARISON:  10/12/2019 FINDINGS: Single frontal view of the chest demonstrates stable postsurgical changes from median sternotomy. The cardiac silhouette is unchanged. There is increased central vascular congestion with interval development of diffuse interstitial prominence and patchy bilateral perihilar airspace disease. Small left pleural effusion is noted. No pneumothorax. IMPRESSION: 1. Findings consistent with mild congestive heart failure.  To Electronically Signed   By: Randa Ngo M.D.   On: 12/26/2019 03:36    ____________________________________________   PROCEDURES  Procedure(s) performed (including Critical Care):  Procedures  CRITICAL CARE Performed by: Paulette Blanch   Total critical care time: 45 minutes  Critical care time was exclusive of separately billable procedures and treating other patients.  Critical  care was necessary to treat or prevent imminent or life-threatening deterioration.  Critical care was time spent personally by me on the following activities: development of treatment plan with patient and/or surrogate as well as nursing, discussions with consultants, evaluation of patient's response to treatment, examination of patient, obtaining history from patient or surrogate, ordering and performing treatments and interventions, ordering and review of laboratory studies, ordering and review of radiographic studies, pulse oximetry and re-evaluation of patient's condition. ____________________________________________   INITIAL IMPRESSION / ASSESSMENT AND PLAN / ED COURSE  As part of my medical decision making, I reviewed the following data within the [facility] notes reviewed and incorporated, Labs reviewed, EKG interpreted, Old chart reviewed, Radiograph reviewed, Discussed with admitting physician and Notes from prior ED visits     Yonah Tangeman was evaluated in Emergency Department on 12/26/2019 for the symptoms described in the history of present illness. He was evaluated in the context of the global COVID-19 pandemic, which necessitated consideration that the patient might be at risk for infection with the SARS-CoV-2 virus that causes COVID-19. Institutional protocols and algorithms that pertain to the evaluation of patients at risk for COVID-19 are in a state of rapid change based on information released by regulatory bodies including the CDC and federal and state organizations. These policies and algorithms were followed during the patient's care in the ED.    74 year old male with COPD, CHF who presents in respiratory distress. Differential includes, but is not limited to, viral syndrome, bronchitis including COPD exacerbation, pneumonia, reactive airway disease including asthma, CHF including exacerbation with or without pulmonary/interstitial edema, pneumothorax, ACS,  thoracic trauma, and pulmonary embolism.  Patient placed on BiPAP immediately upon his arrival to the treatment room.  Suspect predominantly CHF picture with  mixed COPD.  Administer IV Morphine, Lasix, Solu-Medrol, DuoNeb.  Anticipate hospitalization.   Clinical Course as of Dec 26 543  Mon Dec 26, 2019  9326 Patient looking more comfortable on BiPAP.  Able to speak more words.  Confirms he is FULL CODE.   [JS]  0339 HR 115, BP 148/75, 98% on biPAP   [JS]  0358 Lactic acid noted. Code sepsis was not initiated as elevated lactate most likely secondary to respiratory acidosis with clinical picture of flash pulmonary edema.  Patient is afebrile and does not report history of cough.  Will give empiric IV Rocephin and Azithromycin; will check procalcitonin.   [JS]  0544 Procalcitonin 0.11; further antibiotics are not warranted.  Lactic acid has improved.   [JS]    Clinical Course User Index [JS] Paulette Blanch, MD     ____________________________________________   FINAL CLINICAL IMPRESSION(S) / ED DIAGNOSES  Final diagnoses:  COPD exacerbation ([place])  Respiratory distress  Acute on chronic congestive heart failure, unspecified heart failure type ([place])  AKI (acute kidney injury) ([place])  Hyperglycemia     ED Discharge Orders    None       Note:  This document was prepared using Dragon voice recognition software and may include unintentional dictation errors.   Paulette Blanch, MD 12/26/19 7124    Paulette Blanch, MD 12/26/19 [phone]

## 2019-12-26 NOTE — ED Triage Notes (Signed)
Patient presents to Emergency Department via [facility] from home with complaints of SOB for the last hour.  Pt arrives on BIPAP and gave 1" nitro.      History of CHF with lasix use; and afib

## 2019-12-26 NOTE — ED Notes (Addendum)
Wife in lobby, this RN updated with care thus far and POC going forward with intention to admit pt  Wife reports hx of stroke has left pt with double vision and balance control, pt sleeps sitting up, pt uses cane or walker  Wife given visitation hours and number for hosp operator to call for phone in pt's room

## 2019-12-26 NOTE — Progress Notes (Signed)
According to [facility] because patient was COVID positive in December it is okay for pt to be taken off of COVID isolation and patient can continue to be treated as per standard care. Bipap okay under circumstances.

## 2019-12-26 NOTE — Progress Notes (Signed)
He feels a little better today.  Breathing is better but he is not quite back to his baseline.  No cough, wheezing, abdominal or leg swelling. He said he took 45 units of Lantus last night. He is off of BiPAP and he is on 6 L/min oxygen via high flow nasal cannula.  Otherwise rest of vital signs are unremarkable. 20 units of Lantus has been ordered.  Continue current treatment.

## 2019-12-26 NOTE — ED Notes (Signed)
Nitro removed from left chest d/t BP
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
## 2019-12-26 NOTE — Progress Notes (Signed)
*  PRELIMINARY RESULTS* Echocardiogram 2D Echocardiogram has been performed.  [name] [name] 12/26/2019, 8:17 PM

## 2019-12-26 NOTE — Progress Notes (Signed)
Inpatient Diabetes Program Recommendations  AACE/ADA: New Consensus Statement on Inpatient Glycemic Control (2015)  Target Ranges:  Prepandial:   less than 140 mg/dL      Peak postprandial:   less than 180 mg/dL (1-2 hours)      Critically ill patients:  140 - 180 mg/dL   Results for VEER, ELAMIN (MRN [id number]) as of 12/26/2019 08:18  Ref. Range 12/26/2019 06:45  Glucose-Capillary   125 mg Solumedrol given 3:22am Latest Ref Range: 70 - 99 mg/dL  10 units NOVOLOG given at 4:30am  333 (H)  11 units NOVOLOG     Admit with: Acute respiratory failure with hypoxia/ Acute systolic CHF/ Hypertensive emergency/ Chronic systolic heart failure  History: DM, COPD, CKD, CVA, COVID (Dec 2020)  Home DM Meds: Lantus 45 units QHS       Novolog 0-9 units TID per SSI  Current Orders: Novolog Moderate Correction Scale/ SSI (0-15 units) TID AC + HS     MD- Note patient takes Lantus as outpatient.  Please consider starting Lantus 30 units Daily (please start this AM)  70% total home dose to start     --Will follow patient during hospitalization--  Wyn Quaker RN, MSN, CDE Diabetes Coordinator Inpatient Glycemic Control Team Team Pager: [phone] (8a-5p)

## 2019-12-26 NOTE — Progress Notes (Signed)
Pt with chaplain at bedside reviewing advanced directives with patient and wife.

## 2019-12-27 DIAGNOSIS — I5022 Chronic systolic (congestive) heart failure: Secondary | ICD-10-CM

## 2019-12-27 DIAGNOSIS — E440 Moderate protein-calorie malnutrition: Secondary | ICD-10-CM | POA: Insufficient documentation

## 2019-12-27 LAB — GLUCOSE, CAPILLARY
Glucose-Capillary: 145 mg/dL — ABNORMAL HIGH (ref 70–99)
Glucose-Capillary: 243 mg/dL — ABNORMAL HIGH (ref 70–99)
Glucose-Capillary: 300 mg/dL — ABNORMAL HIGH (ref 70–99)
Glucose-Capillary: 224 mg/dL — ABNORMAL HIGH (ref 70–99)

## 2019-12-27 LAB — BASIC METABOLIC PANEL
Anion gap: 11 (ref 5–15)
BUN: 30 mg/dL — ABNORMAL HIGH (ref 8–23)
CO2: 25 mmol/L (ref 22–32)
Calcium: 8.6 mg/dL — ABNORMAL LOW (ref 8.9–10.3)
Chloride: 99 mmol/L (ref 98–111)
Creatinine, Ser: 1.85 mg/dL — ABNORMAL HIGH (ref 0.61–1.24)
GFR calc Af Amer: 41 mL/min — ABNORMAL LOW (ref >60)
GFR calc non Af Amer: 35 mL/min — ABNORMAL LOW (ref >60)
Glucose, Bld: 218 mg/dL — ABNORMAL HIGH (ref 70–99)
Potassium: 4.4 mmol/L (ref 3.5–5.1)
Sodium: 135 mmol/L (ref 135–145)

## 2019-12-27 LAB — ECHOCARDIOGRAM COMPLETE
Height: 71 in
Weight: 2694.9 oz

## 2019-12-27 LAB — HIV ANTIBODY (ROUTINE TESTING W REFLEX): HIV Screen 4th Generation wRfx: NONREACTIVE — AB

## 2019-12-27 MED ORDER — AMLODIPINE BESYLATE 5 MG PO TABS
5.0000 mg | ORAL_TABLET | Freq: Every day | ORAL | Status: DC
  Administered 2019-12-27 – 2019-12-29 (×3): 5 mg via ORAL
  Filled 2019-12-27 (×3): qty 1

## 2019-12-27 MED ORDER — ORAL CARE MOUTH RINSE
15.0000 mL | Freq: Two times a day (BID) | OROMUCOSAL | Status: DC
  Administered 2019-12-28 (×2): 15 mL via OROMUCOSAL

## 2019-12-27 MED ORDER — ENSURE MAX PROTEIN PO LIQD
11.0000 [oz_av] | Freq: Two times a day (BID) | ORAL | Status: DC
  Administered 2019-12-27 – 2019-12-28 (×2): 11 [oz_av] via ORAL
  Filled 2019-12-27: qty 330

## 2019-12-27 MED ORDER — BUDESONIDE 0.5 MG/2ML IN SUSP
0.5000 mg | Freq: Two times a day (BID) | RESPIRATORY_TRACT | Status: DC
  Administered 2019-12-27 – 2019-12-29 (×5): 0.5 mg via RESPIRATORY_TRACT
  Filled 2019-12-27 (×5): qty 2

## 2019-12-27 NOTE — Progress Notes (Addendum)
PROGRESS NOTE    [name]  TKP:[id number] DOB: June 18, 1946 DOA: 12/26/2019  PCP: [name], MD    LOS - 1   Brief Narrative:  [name] is a 74 y.o. male with medical history significant for systolic heart failure, last EF 35 to 40% in December 2020, CAD status post CABG,  COPD, CKD 3B, diabetes, CVA, hospitalized in December 2020 for respiratory failure secondary to Covid pneumonia and heart failure exacerbation who was brought in by EMS on 12/26/19 in severe respiratory distress and on BiPAP after being found with O2 sats in 60's%.  Patient reported acute onset of SOB that woke him from sleep at 2 AM.  In the [facility], BP 207/120, RR 37 100% spO2 on bipap with FiO2 40%.  ECG without acute ST-T wave changes.  Chest xray consistent with pulmonary edema.  Treated in the [facility] with nitropaste, morphine and Lasix with improvement in respiratory status but still required bipap.  Admitted to hospitalist service for further management.   Subjective 2/23: Patient seen this morning at bedside.  No acute events reported overnight.  Patient reports feeling much better today.  Denies cough or wheezing, chest pain, shortness of breath, nausea vomiting or diarrhea.  Assessment & Plan:   Principal Problem:   Acute respiratory failure with hypoxia (HCC) Active Problems:   Chronic systolic heart failure (HCC)   COPD with acute exacerbation (HCC)   Acute systolic CHF (congestive heart failure) (HCC)   Hypertensive emergency   Hyperglycemia due to type 2 diabetes mellitus (HCC)   Chronic kidney disease (CKD) stage G3b/A3, moderately decreased glomerular filtration rate (GFR) between 30-44 mL/min/1.73 square meter and albuminuria creatinine ratio greater than 300 mg/g   History of COVID-19   History of CVA (cerebrovascular accident)   Hx of CABG   History of second degree heart block   Elevated LFTs   Acute respiratory failure with hypoxia secondary to  Acute decompensation of systolic CHF and    Hypertensive emergency Patient does have history of COPD, but not acutely exacerbated, no wheezing on exam.  BP was 270/120 on arrival.  Chest x-ray with pulmonary vascular congestion.  BNP elevated at 1145.  Mildly elevated troponin likely demand ischemia.  No ischemic changes on EKG, no chest pain. --Net fluid balance: -1346 cc to date this admission --Supplemental oxygen as needed to maintain O2 sat greater than 90% --Wean oxygen as tolerated --Continue Lasix 60 mg IV twice daily --Not on beta-blocker, presumably due to history of second-degree heart block --Strict I/O's, daily weights --Low-sodium diet and fluid restriction --Hold off on ACE inhibitor for now due to renal insufficiency --Continue home hydralazine and Imdur BP remains elevated today despite home meds --add amlodipine 5 mg PO daily  History of second degree heart block --Monitor on telemetry --Not on beta-blocker for CHF  COPD with mild acute exacerbation on admission, exacerbation resolved -Continue duo nebs scheduled  --Continue as needed albuterol  --Pulmicort nebs twice daily --Discontinue Rocephin and azithromycin, as procalcitonin negative  Elevated troponin patient  History of CAD status post CABG Troponins trended and flat.  First troponin elevated at 57, patient has no chest pain,  -Continue aspirin, Plavix, simvastatin.   --Not on beta-blocker due to to history of heart block  Hyperglycemia due to type 2 diabetes mellitus -Blood sugar was 429 -Continue sliding scale insulin for now.  Chronic kidney disease (CKD) stage G3b -At baseline  History of CVA (cerebrovascular accident), unspecified type -Continue aspirin, Plavix and simvastatin  Elevated LFTs -  Suspect related to hepatic congestion from heart failure exacerbation    DVT prophylaxis: Lovenox   Code Status: Full Code  Family Communication: None at bedside during encounter  Disposition Plan: Anticipate discharge to prior home  environment pending further clinical improvement.  Will arrange for home health PT and OT as recommended. Coming From home Exp DC Date 2/24-25 Barriers medical as above Medically Stable for Discharge?  No  Consultants:   None  Procedures:   None  Antimicrobials:   Rocephin 2/22>> 2/23   Objective: Vitals:   12/27/19 0400 12/27/19 0500 12/27/19 0600 12/27/19 0644  BP: (!) 155/121  (!) 142/71 137/71  Pulse: 89  94   Resp: (!) 26  20   Temp:      TempSrc:      SpO2: 95%  95%   Weight:  75.5 kg    Height:        Intake/Output Summary (Last 24 hours) at 12/27/2019 0803 Last data filed at 12/26/2019 2102 Gross per 24 hour  Intake 243 ml  Output 1200 ml  Net -957 ml   Filed Weights   12/26/19 0325 12/26/19 0645 12/27/19 0500  Weight: 81.6 kg 76.4 kg 75.5 kg    Examination:  General exam: awake, alert, no acute distress HEENT: moist mucus membranes, hearing grossly normal  Respiratory system: CTAB, decreased at the bases, no wheezes, rales or rhonchi, normal respiratory effort. Cardiovascular system: normal S1/S2, RRR, no JVD, murmurs, rubs, gallops, no pedal edema.   Gastrointestinal system: soft, NT, ND, no HSM felt, +bowel sounds. Central nervous system: A&O x4. no gross focal neurologic deficits, normal speech Extremities: moves all, no cyanosis, normal tone Skin: dry, intact, normal temperature, normal color Psychiatry: normal mood, congruent affect, judgement and insight appear normal    Data Reviewed: I have personally reviewed following labs and imaging studies  CBC: Recent Labs  Lab 12/26/19 0312  WBC 14.7*  NEUTROABS 8.0*  HGB 15.3  HCT 49.0  MCV 90.9  PLT 948   Basic Metabolic Panel: Recent Labs  Lab 12/26/19 0312 12/27/19 0416  NA 136 135  K 3.9 4.4  CL 99 99  CO2 24 25  GLUCOSE 429* 218*  BUN 22 30*  CREATININE 2.01* 1.85*  CALCIUM 8.7* 8.6*   GFR: Estimated Creatinine Clearance: 37.3 mL/min (A) (by C-G formula based on SCr of  1.85 mg/dL (H)). Liver Function Tests: Recent Labs  Lab 12/26/19 0312  AST 62*  ALT 37  ALKPHOS 166*  BILITOT 0.7  PROT 7.5  ALBUMIN 3.7   No results for input(s): LIPASE, AMYLASE in the last 168 hours. No results for input(s): AMMONIA in the last 168 hours. Coagulation Profile: No results for input(s): INR, PROTIME in the last 168 hours. Cardiac Enzymes: No results for input(s): CKTOTAL, CKMB, CKMBINDEX, TROPONINI in the last 168 hours. BNP (last 3 results) No results for input(s): PROBNP in the last 8760 hours. HbA1C: Recent Labs    12/26/19 0825  HGBA1C 7.6*   CBG: Recent Labs  Lab 12/26/19 0645 12/26/19 1110 12/26/19 1607 12/26/19 2114  GLUCAP 333* 257* 232* 264*   Lipid Profile: No results for input(s): CHOL, HDL, LDLCALC, TRIG, CHOLHDL, LDLDIRECT in the last 72 hours. Thyroid Function Tests: No results for input(s): TSH, T4TOTAL, FREET4, T3FREE, THYROIDAB in the last 72 hours. Anemia Panel: No results for input(s): VITAMINB12, FOLATE, FERRITIN, TIBC, IRON, RETICCTPCT in the last 72 hours. Sepsis Labs: Recent Labs  Lab 12/26/19 0312 12/26/19 0506  PROCALCITON 0.11  --  LATICACIDVEN 3.1* 2.0*    Recent Results (from the past 240 hour(s))  Culture, blood (routine x 2)     Status: None (Preliminary result)   Collection Time: 12/26/19  3:12 AM   Specimen: BLOOD  Result Value Ref Range Status   Specimen Description BLOOD RIGHT WRIST  Final   Special Requests   Final    BOTTLES DRAWN AEROBIC AND ANAEROBIC Blood Culture adequate volume   Culture   Final    NO GROWTH 1 DAY Performed at [facility], [address], [place],  [address]    Report Status PENDING  Incomplete  Culture, blood (routine x 2)     Status: None (Preliminary result)   Collection Time: 12/26/19  3:12 AM   Specimen: BLOOD  Result Value Ref Range Status   Specimen Description BLOOD LEFT WRIST  Final   Special Requests   Final    BOTTLES DRAWN AEROBIC AND ANAEROBIC  Blood Culture adequate volume   Culture   Final    NO GROWTH 1 DAY Performed at [facility], [address] [place], [place] [address]    Report Status PENDING  Incomplete  Respiratory Panel by RT PCR (Flu A&B, Covid) - Nasopharyngeal Swab     Status: Abnormal   Collection Time: 12/26/19  3:12 AM   Specimen: Nasopharyngeal Swab  Result Value Ref Range Status   SARS Coronavirus 2 by RT PCR POSITIVE (A) NEGATIVE Final    Comment: RESULT CALLED TO, READ BACK BY AND VERIFIED WITH: noel webster at 0500 on 12/26/19 rww (NOTE) SARS-CoV-2 target nucleic acids are DETECTED. SARS-CoV-2 RNA is generally detectable in upper respiratory specimens  during the acute phase of infection. Positive results are indicative of the presence of the identified virus, but do not rule out bacterial infection or co-infection with other pathogens not detected by the test. Clinical correlation with patient history and other diagnostic information is necessary to determine patient infection status. The expected result is Negative. Fact Sheet for Patients:  PinkCheek.be Fact Sheet for Healthcare Providers: GravelBags.it This test is not yet approved or cleared by the [place] FDA and  has been authorized for detection and/or diagnosis of SARS-CoV-2 by FDA under an Emergency Use Authorization (EUA).  This EUA will remain in effect (meaning this test can be used) f or the duration of  the COVID-19 declaration under Section 564(b)(1) of the Act, 21 U.S.C. section 360bbb-3(b)(1), unless the authorization is terminated or revoked sooner.    Influenza A by PCR NEGATIVE NEGATIVE Final   Influenza B by PCR NEGATIVE NEGATIVE Final    Comment: (NOTE) The Xpert Xpress SARS-CoV-2/FLU/RSV assay is intended as an aid in  the diagnosis of influenza from Nasopharyngeal swab specimens and  should not be used as a sole basis for treatment. Nasal washings  and  aspirates are unacceptable for Xpert Xpress SARS-CoV-2/FLU/RSV  testing. Fact Sheet for Patients: PinkCheek.be Fact Sheet for Healthcare Providers: GravelBags.it This test is not yet approved or cleared by the [place] FDA and  has been authorized for detection and/or diagnosis of SARS-CoV-2 by  FDA under an Emergency Use Authorization (EUA). This EUA will remain  in effect (meaning this test can be used) for the duration of the  Covid-19 declaration under Section 564(b)(1) of the Act, 21  U.S.C. section 360bbb-3(b)(1), unless the authorization is  terminated or revoked. Performed at [facility], [address] [place], [place] [address]  Radiology Studies: DG Chest Port 1 View  Result Date: 12/26/2019 CLINICAL DATA:  Shortness of breath for 1 hour EXAM: PORTABLE CHEST 1 VIEW COMPARISON:  10/12/2019 FINDINGS: Single frontal view of the chest demonstrates stable postsurgical changes from median sternotomy. The cardiac silhouette is unchanged. There is increased central vascular congestion with interval development of diffuse interstitial prominence and patchy bilateral perihilar airspace disease. Small left pleural effusion is noted. No pneumothorax. IMPRESSION: 1. Findings consistent with mild congestive heart failure.  To Electronically Signed   By: Randa Ngo M.D.   On: 12/26/2019 03:36        Scheduled Meds: . aspirin EC  81 mg Oral Daily  . Chlorhexidine Gluconate Cloth  6 each Topical Daily  . cholecalciferol  1,000 Units Oral Daily  . clopidogrel  75 mg Oral Daily  . enoxaparin (LOVENOX) injection  40 mg Subcutaneous Q24H  . furosemide  60 mg Intravenous Q12H  . gabapentin  100 mg Oral BID  . hydrALAZINE  50 mg Oral Q8H  . insulin aspart  0-15 Units Subcutaneous TID WC  . insulin aspart  0-5 Units Subcutaneous QHS  . insulin glargine  20 Units Subcutaneous Daily  .  ipratropium-albuterol  3 mL Nebulization TID  . isosorbide mononitrate  30 mg Oral Daily  . magnesium oxide  200 mg Oral Daily  . pantoprazole  40 mg Oral Daily  . potassium chloride  10 mEq Oral Daily  . simvastatin  20 mg Oral QHS  . sodium chloride flush  3 mL Intravenous Q12H   Continuous Infusions: . sodium chloride    . cefTRIAXone (ROCEPHIN)  IV       LOS: 1 day    Time spent: 40 minutes    Ezekiel Slocumb, DO [facility]   If 7PM-7AM, please contact night-coverage [email or web address] 12/27/2019, 8:03 AM

## 2019-12-27 NOTE — Plan of Care (Signed)
  Problem: Education: Goal: Knowledge of General Education information will improve Description: Including pain rating scale, medication(s)/side effects and non-pharmacologic comfort measures Outcome: Progressing   Problem: Clinical Measurements: Goal: Ability to maintain clinical measurements within normal limits will improve Outcome: Progressing   

## 2019-12-27 NOTE — Progress Notes (Signed)
Initial Nutrition Assessment  DOCUMENTATION CODES:   Non-severe (moderate) malnutrition in context of chronic illness  INTERVENTION:  Provide Ensure Max Protein po BID, each supplement provides 150 kcal and 30 grams of protein.  Encouraged adequate intake of protein at meals.  NUTRITION DIAGNOSIS:   Moderate Malnutrition related to chronic illness(COPD, CHF) as evidenced by mild-moderate fat depletion, mild-moderate muscle depletion.  GOAL:   Patient will meet greater than or equal to 90% of their needs  MONITOR:   PO intake, Supplement acceptance, Labs, Weight trends, Skin, I & O's  REASON FOR ASSESSMENT:   Consult Assessment of nutrition requirement/status  ASSESSMENT:   74 year old male with PMHx of COPD, CAD s/p CABG, DM, HTN, hx CVA, CHF, GERD, CKD, prostate cancer, HLD, PVD, hx COVID-19 in 10/2019 admitted with acute systolic CHF, hypertensive emergency, acute exacerbation of COPD.   Met with patient at bedside. He reports his appetite was decreased for a few days but he feels like it is improving now. He was only eating 50% of his meals yesterday but reports he was able to eat well at breakfast this morning (100%). Patient reports he typically eats two meals per day. He has breakfast and dinner. He reports eating well-balanced meals but reports his intake varies day to day so unable to provide specifics. He does endorse feeling weaker now. Discussed importance of meeting calorie/protein needs to prevent further loss of lean body mass. Patient is amenable to drinking ONS to help meet calorie/protein needs.  Patient reports he does not believe he has had any unintentional weight loss. According to chart he was 79.4 kg on 10/11/2019. He is now 75.5 kg (166.45 lbs). He has lost 3.9 kg (4.9% body weight) over the past 2 months, which is not significant for time frame but is still concerning.  Medications reviewed and include: vitamin D3 1000 units daily, Lasix 60 mg Q12hrs IV,  gabapentin, Novolog 0-15 units TID, Novolog 0-5 units QHS, Lantus 20 units daily, magnesium oxide 200 mg daily, pantoprazole, potassium chloride 10 mEq daily, ceftriaxone.  Labs reviewed: CBG 145-264, BUN 30, Creatinine 1.85.  NUTRITION - FOCUSED PHYSICAL EXAM:    Most Recent Value  Orbital Region  Moderate depletion  Upper Arm Region  Mild depletion  Thoracic and Lumbar Region  Mild depletion  Buccal Region  Moderate depletion  Temple Region  Moderate depletion  Clavicle Bone Region  Mild depletion  Clavicle and Acromion Bone Region  Mild depletion  Scapular Bone Region  Mild depletion  Dorsal Hand  Moderate depletion  Patellar Region  Mild depletion  Anterior Thigh Region  Mild depletion  Posterior Calf Region  Mild depletion  Edema (RD Assessment)  None  Hair  Reviewed  Eyes  Reviewed  Mouth  Reviewed  Skin  Reviewed  Nails  Reviewed     Diet Order:   Diet Order            Diet heart healthy/carb modified Room service appropriate? Yes; Fluid consistency: Thin; Fluid restriction: 1500 mL Fluid  Diet effective now             EDUCATION NEEDS:   Education needs have been addressed  Skin:  Skin Assessment: Reviewed RN Assessment(MSAD to buttocks)  Last BM:  Unknown  Height:   Ht Readings from Last 1 Encounters:  12/26/19 5' 11"  (1.803 m)   Weight:   Wt Readings from Last 1 Encounters:  12/27/19 75.5 kg   Ideal Body Weight:  78.2 kg  BMI:  Body mass index is 23.21 kg/m.  Estimated Nutritional Needs:   Kcal:  1900-2100  Protein:  95-105 grams  Fluid:  1.8 L/day  Jacklynn Barnacle, MS, RD, LDN Pager number available on Amion
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
## 2019-12-27 NOTE — Progress Notes (Signed)
Physical Therapy Treatment Patient Details Name: [name] MRN: [id number] DOB: [birth year] Today's Date: 12/27/2019    History of Present Illness Pt is 74 y/o M with PMH: COPD, CAD s/p CABG, DM, HTN, CVA, sCHF, CKD, and COVID-19 10/2019. Presented to ER secondary to severe respiratory distress, requiring BiPAP initially; admitted for management of acute respiratory failure related to hypoxia, CHF exacerbation.    PT Comments    Pt alert, in chair, wife at bedside, agreeable to PT. Overall the patient demonstrated great progress towards goals. Sit <> stand with CGA/supervision and RW x5 reps, improved safety and stability noted with cueing and repetition. The patient also ambulated ~189ft with RW and CGA. Pt with flexed trunk, cues for posture and RW placement, did exhibit decreased step length/height. spO2>92% on 2L throughout mobility, HR showing afib in 90s-low 100s. Pt did not need physical assist during ambulation. Pt in recliner with all needs in reach, in no acute distress. The patient would benefit from further skilled PT intervention to continue to progress towards goals, current recommendation remains appropriate.    Follow Up Recommendations  Home health PT     Equipment Recommendations  Rolling walker with 5" wheels    Recommendations for Other Services       Precautions / Restrictions Precautions Precautions: Fall Restrictions Weight Bearing Restrictions: No    Mobility  Bed Mobility               General bed mobility comments: pt recieved sitting up in chair  Transfers Overall transfer level: Needs assistance Equipment used: Rolling walker (2 wheeled) Transfers: Sit to/from Stand Sit to Stand: Min guard;Supervision         General transfer comment: provided verbal cues, faded cueing with repetition pt able to maintain safe hand placement  Ambulation/Gait Ambulation/Gait assistance: Min guard Gait Distance (Feet): 100 Feet Assistive device: Rolling  walker (2 wheeled)   Gait velocity: decreased   General Gait Details: Pt with flexed trunk, cues for posture and RW placement. did exhibit decreased step length/height. spO2>92% on 2L throughout mobility   Stairs             Wheelchair Mobility    Modified Rankin (Stroke Patients Only)       Balance Overall balance assessment: Needs assistance Sitting-balance support: Feet supported Sitting balance-Leahy Scale: Good     Standing balance support: Bilateral upper extremity supported Standing balance-Leahy Scale: Fair Standing balance comment: pt able to maintain static standing without UE support                            Cognition Arousal/Alertness: Awake/alert Behavior During Therapy: WFL for tasks assessed/performed Overall Cognitive Status: Within Functional Limits for tasks assessed                                        Exercises Other Exercises Other Exercises: Pt performed sit <> stand transfer with RW x5 reps this session, improved stability noted with ea repetition as well as improved hand placement Other Exercises: OT facilitates education and pt participation in seated breathing/postural extension exercises for one set of 10 reps with 2-3 second hold/deep breath to increase surface area for deeper breathing. In addition, OT facilitates education re: implementation of breathing exercises outside of therapy to improve continuity and pt endurance/activity tolerance for standing tasks/fxl mobility. Pt and spouse verbalize understanding.  General Comments        Pertinent Vitals/Pain Pain Assessment: No/denies pain    Home Living Family/patient expects to be discharged to:: Private residence Living Arrangements: Spouse/significant other Available Help at Discharge: Family Type of Home: House Home Access: Stairs to enter   Home Layout: One level Home Equipment: Shower seat;Cane - single point;Walker - 4 wheels;Wheelchair -  manual      Prior Function Level of Independence: Needs assistance;Independent with assistive device(s)  Gait / Transfers Assistance Needed: MOD I with SPC but endorses >5 falls in one year. ADL's / Homemaking Assistance Needed: Pt and spouse report that pt Indep most BADLs, but wife provides some setup/Supv for bathing d/t wet floor/hx falls. Pt showers in sitting on shower seat now. Comments: Pt reports recently stopping driving d/t double vision. No home O2. Wife available to assist as needed. Of note: pt reports large amount of items in hallways and generally in the home making use of walker difficult d/t lmiited space.   PT Goals (current goals can now be found in the care plan section) Acute Rehab PT Goals Patient Stated Goal: to get to moving around better and improve my endurance Progress towards PT goals: Progressing toward goals    Frequency    Min 2X/week      PT Plan Current plan remains appropriate    Co-evaluation              AM-PAC PT "6 Clicks" Mobility   Outcome Measure  Help needed turning from your back to your side while in a flat bed without using bedrails?: None Help needed moving from lying on your back to sitting on the side of a flat bed without using bedrails?: A Little Help needed moving to and from a bed to a chair (including a wheelchair)?: A Little Help needed standing up from a chair using your arms (e.g., wheelchair or bedside chair)?: A Little Help needed to walk in hospital room?: A Little Help needed climbing 3-5 steps with a railing? : A Little 6 Click Score: 19    End of Session Equipment Utilized During Treatment: Oxygen(2L) Activity Tolerance: Patient tolerated treatment well Patient left: in chair;with call bell/phone within reach;with family/visitor present;with nursing/sitter in room Nurse Communication: Mobility status PT Visit Diagnosis: Muscle weakness (generalized) (M62.81);Difficulty in walking, not elsewhere classified  (R26.2)     Time: 1219-7588 PT Time Calculation (min) (ACUTE ONLY): 34 min  Charges:  $Therapeutic Exercise: 23-37 mins                     Lieutenant Diego PT, DPT 2:10 PM,12/27/19

## 2019-12-27 NOTE — TOC Initial Note (Signed)
Transition of Care [facility]) - Initial/Assessment Note    Patient Details  Name: [name] MRN: [id number] Date of Birth: [birth year]  [facility] of [facility]) CM/SW Contact:    Magnus Ivan, [place] Phone Number: 12/27/2019, 10:55 AM  Clinical Narrative:                CSW met with patient at bedside. Explained CSW role. Patient reported he lives with his wife who is involved and supportive with his care. Patient reported his wife takes him to appointments. Patient uses Product[facility]process development scientist in [place] and denied issues with obtaining medications. PCP is Dr. Baldemar Lenis, [place] submitted request for Nurse Secretary to schedule PCP follow up appointment for within 5-7 days of discharge. Patient reported he has DME at home including a rollator, manual wheelchair, 2 power wheelchairs, and a shower chair. Patient denied equipment needs at this time. Patient reported he has home health services through [facility]. He stated he would like to continue these services upon discharge. CSW called and spoke to [facility] with [facility] and confirmed this.   CSW also completed Heart Failure Consult with patient at bedside. Patient reported he does not have a scale but plans to get one from [place]. CSW provided education on recommendation for monitoring patient's weight daily and reporting weight gains of 2 pounds overnight or 5 pounds in one week to his doctor. Patient verbalized understanding. Patient reported he does not go to the [facility], but does follow up regularly with his PCP and Cardiologist. Patient said he has had heart failure since 2006 and denied need for additional education at this time. Per chart, patient does have an appointment scheduled with the [facility] in March.   Patient denied any needs at this time and was encouraged to reach out if needs arise. CSW will continue to follow.    Expected Discharge Plan: [place] Barriers to Discharge: Continued Medical  Work up   Patient Goals and CMS Choice   CMS Medicare.gov Compare Post Acute Care list provided to:: (Patient stated he is active with [facility] for Home Health services and would like to continue with them upon discharge.)    Expected Discharge Plan and Services Expected Discharge Plan: [place] arrangements for the past 2 months: Single Family Home                                      Prior Living Arrangements/Services Living arrangements for the past 2 months: Single Family Home Lives with:: Spouse Patient language and need for interpreter reviewed:: Yes Do you feel safe going back to the place where you live?: Yes      Need for Family Participation in Patient Care: Yes (Comment) Care giver support system in place?: Yes (comment) Current home services: DME, Home OT, Home PT, Home RN(Called Cassie with [facility] to confirm.) Criminal Activity/Legal Involvement Pertinent to Current Situation/Hospitalization: No - Comment as needed  Activities of Daily Living Home Assistive Devices/Equipment: Cane (specify quad or straight)(straight cane) ADL Screening (condition at time of admission) Patient's cognitive ability adequate to safely complete daily activities?: Yes Is the patient deaf or have difficulty hearing?: No Does the patient have difficulty seeing, even when wearing glasses/contacts?: Yes Does the patient have difficulty concentrating, remembering, or making decisions?: No Patient able to express need for assistance with  ADLs?: Yes Does the patient have difficulty dressing or bathing?: Yes Independently performs ADLs?: No Communication: Independent Dressing (OT): Independent Grooming: Independent Feeding: Independent Bathing: Dependent Is this a change from baseline?: Pre-admission baseline Toileting: Needs assistance Is this a change from baseline?: Pre-admission baseline In/Out Bed: Independent Walks in Home:  Independent Does the patient have difficulty walking or climbing stairs?: Yes Weakness of Legs: Both Weakness of Arms/Hands: Both  Permission Sought/Granted   Permission granted to share information with : Yes, Verbal Permission Granted     Permission granted to share info w AGENCY: [facility]        Emotional Assessment Appearance:: Appears stated age, Developmentally appropriate Attitude/Demeanor/Rapport: Engaged, Gracious Affect (typically observed): Calm Orientation: : Oriented to Self, Oriented to Place, Oriented to  Time, Oriented to Situation Alcohol / Substance Use: Not Applicable Psych Involvement: No (comment)  Admission diagnosis:  Respiratory distress [R06.03] Hyperglycemia [R73.9] COPD exacerbation (HCC) [J44.1] Acute respiratory failure with hypoxia ([place]) [J96.01] AKI (acute kidney injury) ([place]) [N17.9] Acute on chronic congestive heart failure, unspecified heart failure type ([place]) [I50.9] Patient Active Problem List   Diagnosis Date Noted  . Hypertensive emergency 12/26/2019  . Hyperglycemia due to type 2 diabetes mellitus ([place]) 12/26/2019  . Chronic kidney disease (CKD) stage G3b/A3, moderately decreased glomerular filtration rate (GFR) between 30-44 mL/min/1.73 square meter and albuminuria creatinine ratio greater than 300 mg/g 12/26/2019  . History of COVID-19 12/26/2019  . History of CVA (cerebrovascular accident) 12/26/2019  . 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 History of second degree heart block 12/26/2019  . Elevated LFTs 12/26/2019  . Pneumonia due to COVID-19 virus   . Acute systolic CHF (congestive heart failure) ([place]) 10/11/2019  . Malignant neoplasm of prostate ([place]) 11/24/2016  . Bright red rectal bleeding 10/02/2016  . COPD with acute exacerbation ([place]) 01/17/2016  . Chronic systolic heart failure ([place]) 12/12/2015  . Essential hypertension 12/12/2015  . COPD (chronic obstructive pulmonary disease) with chronic bronchitis ([place]) 12/12/2015  .  Diabetes ([place]) 12/12/2015  . Tobacco abuse 12/12/2015  . Acute respiratory failure with hypoxia ([place]) 12/03/2015   PCP:  [name], MD Pharmacy:   [facility] [address] [place] [address] Phone: [phone] Fax: [phone]     Social Determinants of Health (SDOH) Interventions    Readmission Risk Interventions Readmission Risk Prevention Plan 12/27/2019  Transportation Screening Complete  PCP or Specialist Appt within 3-5 Days Complete  HRI or Home Care Consult Complete  Medication Review (RN Care Manager) Complete  Some recent data might be hidden

## 2019-12-28 LAB — COMPREHENSIVE METABOLIC PANEL
ALT: 24 U/L (ref 0–44)
AST: 16 U/L (ref 15–41)
Albumin: 3.3 g/dL — ABNORMAL LOW (ref 3.5–5.0)
Alkaline Phosphatase: 91 U/L (ref 38–126)
Anion gap: 8 (ref 5–15)
BUN: 35 mg/dL — ABNORMAL HIGH (ref 8–23)
CO2: 30 mmol/L (ref 22–32)
Calcium: 8.6 mg/dL — ABNORMAL LOW (ref 8.9–10.3)
Chloride: 97 mmol/L — ABNORMAL LOW (ref 98–111)
Creatinine, Ser: 1.67 mg/dL — ABNORMAL HIGH (ref 0.61–1.24)
GFR calc Af Amer: 46 mL/min — ABNORMAL LOW (ref >60)
GFR calc non Af Amer: 40 mL/min — ABNORMAL LOW (ref >60)
Glucose, Bld: 133 mg/dL — ABNORMAL HIGH (ref 70–99)
Potassium: 4.1 mmol/L (ref 3.5–5.1)
Sodium: 135 mmol/L (ref 135–145)
Total Bilirubin: 1.3 mg/dL — ABNORMAL HIGH (ref 0.3–1.2)
Total Protein: 6.7 g/dL (ref 6.5–8.1)

## 2019-12-28 LAB — CBC
HCT: 41.3 % (ref 39.0–52.0)
Hemoglobin: 13.5 g/dL (ref 13.0–17.0)
MCH: 28.5 pg (ref 26.0–34.0)
MCHC: 32.7 g/dL (ref 30.0–36.0)
MCV: 87.3 fL (ref 80.0–100.0)
Platelets: 281 10*3/uL (ref 150–400)
RBC: 4.73 MIL/uL (ref 4.22–5.81)
RDW: 18 % — ABNORMAL HIGH (ref 11.5–15.5)
WBC: 11.5 10*3/uL — ABNORMAL HIGH (ref 4.0–10.5)
nRBC: 0 % (ref 0.0–0.2)

## 2019-12-28 LAB — GLUCOSE, CAPILLARY
Glucose-Capillary: 137 mg/dL — ABNORMAL HIGH (ref 70–99)
Glucose-Capillary: 188 mg/dL — ABNORMAL HIGH (ref 70–99)
Glucose-Capillary: 190 mg/dL — ABNORMAL HIGH (ref 70–99)
Glucose-Capillary: 200 mg/dL — ABNORMAL HIGH (ref 70–99)

## 2019-12-28 LAB — MAGNESIUM: Magnesium: 2.2 mg/dL (ref 1.7–2.4)

## 2019-12-28 MED ORDER — APIXABAN 5 MG PO TABS
5.0000 mg | ORAL_TABLET | Freq: Two times a day (BID) | ORAL | Status: DC
  Administered 2019-12-28 – 2019-12-29 (×2): 5 mg via ORAL
  Filled 2019-12-28 (×2): qty 1

## 2019-12-28 MED ORDER — METOPROLOL TARTRATE 25 MG PO TABS
12.5000 mg | ORAL_TABLET | Freq: Two times a day (BID) | ORAL | Status: DC
  Administered 2019-12-28: 12.5 mg via ORAL
  Filled 2019-12-28 (×2): qty 1

## 2019-12-28 MED ORDER — FUROSEMIDE 10 MG/ML IJ SOLN
40.0000 mg | Freq: Two times a day (BID) | INTRAMUSCULAR | Status: DC
  Administered 2019-12-28 (×2): 40 mg via INTRAVENOUS
  Filled 2019-12-28 (×2): qty 4

## 2019-12-28 MED ORDER — METOPROLOL TARTRATE 50 MG PO TABS
50.0000 mg | ORAL_TABLET | Freq: Two times a day (BID) | ORAL | Status: DC
  Administered 2019-12-28: 50 mg via ORAL
  Filled 2019-12-28: qty 1

## 2019-12-28 NOTE — Progress Notes (Signed)
Pt going in and out of Afib w PVCs this AM . Cardiology C/S. Pt HR in the 40s at times this afternoon asymptomatic. MD notified and meds adjusted. See MAR.

## 2019-12-28 NOTE — Progress Notes (Addendum)
PROGRESS NOTE    [name]  BZJ:[id number] DOB: 1946-05-22 DOA: 12/26/2019  PCP: [name], MD    LOS - 2   Brief Narrative:  [name] a 74 y.o.malewith medical history significant forsystolic heart failure, last EF 35 to 40% in December 2020, CAD status post CABG, COPD, CKD 3B, diabetes, CVA, hospitalized in December 2020 for respiratory failure secondary to Covid pneumonia and heart failure exacerbation who was brought in by EMS on 12/26/19 in severe respiratory distress and on BiPAP after being found with O2 sats in 60's%.Patient reported acute onset of SOB that woke him from sleep at 2 AM.  In the ED, BP 207/120, RR 37 100% spO2 on bipap with FiO2 40%.  ECG without acute ST-T wave changes.  Chest xray consistent with pulmonary edema.  Treated in the ED with nitropaste, morphine and Lasix with improvement in respiratory status but still required bipap.  Admitted to hospitalist service for further management.   Subjective 2/24: Patient up in chair eating breakfast when seen this morning.  He no acute events reported overnight.  He states he is feeling fine, and asks if he will get to go home today.  He denies chest pain, palpitations, shortness of breath, fevers chills or other acute complaints.  This morning on telemetry, patient appeared to have run of A. fib with PVCs.  A. fib confirmed on EKG and cardiology was consulted.  Assessment & Plan:   Principal Problem:   Acute respiratory failure with hypoxia (HCC) Active Problems:   Chronic systolic heart failure (HCC)   COPD with acute exacerbation (HCC)   Acute systolic CHF (congestive heart failure) (HCC)   Hypertensive emergency   Hyperglycemia due to type 2 diabetes mellitus (HCC)   Chronic kidney disease (CKD) stage G3b/A3, moderately decreased glomerular filtration rate (GFR) between 30-44 mL/min/1.73 square meter and albuminuria creatinine ratio greater than 300 mg/g   History of COVID-19   History of CVA  (cerebrovascular accident)   Hx of CABG   History of second degree heart block   Elevated LFTs   Malnutrition of moderate degree   New onset Paroxysmal Atrial Fibrillation - noted on telemetry this AM.  Patient asymptomatic.  CHA2DS2-VASc score of 7 (11.2% stroke risk per year). --Cardiology consulted --increased beta blocker dose --consider full anticoagulation in place of DAPT --close follow up with Dr. Ubaldo Glassing on discharge ADDENDUM: patient continues to go in and out of a-fib today, concerned for his stroke risk as he has history.  Will start Eliquis 5 mg PO BID.  Stop Plavix for now.  Re-address with cardiology in follow-up.  Acute respiratory failure with hypoxia secondary to  Acute decompensation of systolic CHF and  Hypertensive emergency Patient does have history of COPD, but not acutely exacerbated, no wheezing on exam.  BP was 270/120 on arrival.  Chest x-ray with pulmonary vascular congestion.  BNP elevated at 1145.  Mildly elevated troponin likely demand ischemia.  No ischemic changes on EKG, no chest pain. --Net fluid balance: -4296 cc to date this admission --Supplemental oxygen as needed to maintain O2 sat greater than 90% --Wean oxygen as tolerated --reduce Lasix to 40 mg IV twice daily --Not on beta-blocker, presumably due to history of second-degree heart block --Strict I/O's, daily weights --Low-sodium diet and fluid restriction --Hold off on ACE inhibitor for now due to renal insufficiency --Continue home hydralazine and Imdur BP remains elevated today despite home meds  --add amlodipine 5 mg PO daily  History of second degree heart  block --Monitor on telemetry --Not on beta-blocker for CHF  COPD with mild acute exacerbation on admission, exacerbation resolved -Continue duo nebs scheduled  --Continue as needed albuterol  --Pulmicort nebs twice daily --Discontinue Rocephin and azithromycin, as procalcitonin negative  Elevated troponin patient  History of  CAD status post CABG Troponins trended and flat.  First troponin elevated at 57, patient has no chest pain,  -Continue aspirin, Plavix, simvastatin.  --Not on beta-blocker due to to history of heart block  Hyperglycemia due to type 2 diabetes mellitus -Blood sugar was 429 -Continue sliding scale insulin for now.  Chronic kidney disease (CKD) stage G3b -At baseline  History of CVA (cerebrovascular accident), likely embolic with underlying a-fib -Continue aspirin, Plavix and simvastatin  Elevated LFTs -Suspect related to hepatic congestion from heart failure exacerbation   DVT prophylaxis: Lovenox   Code Status: Full Code  Family Communication: None at bedside during encounter  Disposition Plan: Anticipate discharge to prior home environment pending further clinical improvement and clearance by cardiology, given new onset a-fib today.  Will arrange for home health PT and OT as recommended. Coming From home Exp DC Date 2/25 Barriers medical as above Medically Stable for Discharge?  No  Consultants:   None  Procedures:   None  Antimicrobials:   Rocephin 2/22>> 2/23   Objective: Vitals:   12/28/19 0435 12/28/19 0714 12/28/19 0743 12/28/19 1007  BP: (!) 138/95  132/81   Pulse: 88  (!) 59 81  Resp: 18  17   Temp: 98.1 F (36.7 C)  98.5 F (36.9 C)   TempSrc: Oral  Oral   SpO2: 98% 97% 95%   Weight: 74.4 kg     Height:        Intake/Output Summary (Last 24 hours) at 12/28/2019 1112 Last data filed at 12/28/2019 0930 Gross per 24 hour  Intake 120 ml  Output 4350 ml  Net -4230 ml   Filed Weights   12/27/19 0500 12/27/19 2030 12/28/19 0435  Weight: 75.5 kg 75.8 kg 74.4 kg    Examination:  General exam: awake, alert, no acute distress HEENT: atraumatic, clear conjunctiva, anicteric sclera, moist mucus membranes, hearing grossly normal  Respiratory system: CTAB, bibasilar rales, no wheezes or rhonchi, normal respiratory effort. Cardiovascular  system: normal S1/S2, RRR, no pedal edema.   Central nervous system: A&O x4. no gross focal neurologic deficits, normal speech Extremities: moves all, no cyanosis, normal tone Skin: dry, intact, normal temperature, normal color Psychiatry: normal mood, congruent affect, judgement and insight appear normal    Data Reviewed: I have personally reviewed following labs and imaging studies  CBC: Recent Labs  Lab 12/26/19 0312 12/28/19 0548  WBC 14.7* 11.5*  NEUTROABS 8.0*  --   HGB 15.3 13.5  HCT 49.0 41.3  MCV 90.9 87.3  PLT 346 034   Basic Metabolic Panel: Recent Labs  Lab 12/26/19 0312 12/27/19 0416 12/28/19 0548  NA 136 135 135  K 3.9 4.4 4.1  CL 99 99 97*  CO2 24 25 30   GLUCOSE 429* 218* 133*  BUN 22 30* 35*  CREATININE 2.01* 1.85* 1.67*  CALCIUM 8.7* 8.6* 8.6*  MG  --   --  2.2   GFR: Estimated Creatinine Clearance: 40.8 mL/min (A) (by C-G formula based on SCr of 1.67 mg/dL (H)). Liver Function Tests: Recent Labs  Lab 12/26/19 0312 12/28/19 0548  AST 62* 16  ALT 37 24  ALKPHOS 166* 91  BILITOT 0.7 1.3*  PROT 7.5 6.7  ALBUMIN 3.7 3.3*  No results for input(s): LIPASE, AMYLASE in the last 168 hours. No results for input(s): AMMONIA in the last 168 hours. Coagulation Profile: No results for input(s): INR, PROTIME in the last 168 hours. Cardiac Enzymes: No results for input(s): CKTOTAL, CKMB, CKMBINDEX, TROPONINI in the last 168 hours. BNP (last 3 results) No results for input(s): PROBNP in the last 8760 hours. HbA1C: Recent Labs    12/26/19 0825  HGBA1C 7.6*   CBG: Recent Labs  Lab 12/27/19 0741 12/27/19 1109 12/27/19 1600 12/27/19 2110 12/28/19 0743  GLUCAP 145* 243* 300* 224* 137*   Lipid Profile: No results for input(s): CHOL, HDL, LDLCALC, TRIG, CHOLHDL, LDLDIRECT in the last 72 hours. Thyroid Function Tests: No results for input(s): TSH, T4TOTAL, FREET4, T3FREE, THYROIDAB in the last 72 hours. Anemia Panel: No results for input(s):  VITAMINB12, FOLATE, FERRITIN, TIBC, IRON, RETICCTPCT in the last 72 hours. Sepsis Labs: Recent Labs  Lab 12/26/19 0312 12/26/19 0506  PROCALCITON 0.11  --   LATICACIDVEN 3.1* 2.0*    Recent Results (from the past 240 hour(s))  Culture, blood (routine x 2)     Status: None (Preliminary result)   Collection Time: 12/26/19  3:12 AM   Specimen: BLOOD  Result Value Ref Range Status   Specimen Description BLOOD RIGHT WRIST  Final   Special Requests   Final    BOTTLES DRAWN AEROBIC AND ANAEROBIC Blood Culture adequate volume   Culture   Final    NO GROWTH 2 DAYS Performed at [facility], [address] [place], [place] [address]    Report Status PENDING  Incomplete  Culture, blood (routine x 2)     Status: None (Preliminary result)   Collection Time: 12/26/19  3:12 AM   Specimen: BLOOD  Result Value Ref Range Status   Specimen Description BLOOD LEFT WRIST  Final   Special Requests   Final    BOTTLES DRAWN AEROBIC AND ANAEROBIC Blood Culture adequate volume   Culture   Final    NO GROWTH 2 DAYS Performed at [facility], [address], [place], [place] [address]    Report Status PENDING  Incomplete  Respiratory Panel by RT PCR (Flu A&B, Covid) - Nasopharyngeal Swab     Status: Abnormal   Collection Time: 12/26/19  3:12 AM   Specimen: Nasopharyngeal Swab  Result Value Ref Range Status   SARS Coronavirus 2 by RT PCR POSITIVE (A) NEGATIVE Final    Comment: RESULT CALLED TO, READ BACK BY AND VERIFIED WITH: noel webster at 0500 on 12/26/19 rww (NOTE) SARS-CoV-2 target nucleic acids are DETECTED. SARS-CoV-2 RNA is generally detectable in upper respiratory specimens  during the acute phase of infection. Positive results are indicative of the presence of the identified virus, but do not rule out bacterial infection or co-infection with other pathogens not detected by the test. Clinical correlation with patient history and other diagnostic information is  necessary to determine patient infection status. The expected result is Negative. Fact Sheet for Patients:  PinkCheek.be Fact Sheet for Healthcare Providers: GravelBags.it This test is not yet approved or cleared by the [place] FDA and  has been authorized for detection and/or diagnosis of SARS-CoV-2 by FDA under an Emergency Use Authorization (EUA).  This EUA will remain in effect (meaning this test can be used) f or the duration of  the COVID-19 declaration under Section 564(b)(1) of the Act, 21 U.S.C. section 360bbb-3(b)(1), unless the authorization is terminated or revoked sooner.    Influenza A by  PCR NEGATIVE NEGATIVE Final   Influenza B by PCR NEGATIVE NEGATIVE Final    Comment: (NOTE) The Xpert Xpress SARS-CoV-2/FLU/RSV assay is intended as an aid in  the diagnosis of influenza from Nasopharyngeal swab specimens and  should not be used as a sole basis for treatment. Nasal washings and  aspirates are unacceptable for Xpert Xpress SARS-CoV-2/FLU/RSV  testing. Fact Sheet for Patients: PinkCheek.be Fact Sheet for Healthcare Providers: GravelBags.it This test is not yet approved or cleared by the [place] FDA and  has been authorized for detection and/or diagnosis of SARS-CoV-2 by  FDA under an Emergency Use Authorization (EUA). This EUA will remain  in effect (meaning this test can be used) for the duration of the  Covid-19 declaration under Section 564(b)(1) of the Act, 21  U.S.C. section 360bbb-3(b)(1), unless the authorization is  terminated or revoked. Performed at [facility], [address] [place], [place] [address]          Radiology Studies: ECHOCARDIOGRAM COMPLETE  Result Date: 12/27/2019    ECHOCARDIOGRAM REPORT   Patient Name:   [name] Date of Exam: 12/26/2019 Medical Rec #:  [id number]  Height:       71.0 in Accession  #:    [id number] Weight:       168.4 lb Date of Birth:  1946/01/13  BSA:          1.960 m Patient Age:    63 years   BP:           122/65 mmHg Patient Gender: M          HR:           78 bpm. Exam Location:  [facility] Procedure: 2D Echo, Cardiac Doppler and Color Doppler Indications:     CHF 428.31  History:         Patient has prior history of Echocardiogram examinations. CHF,                  CAD; Stroke.  Sonographer:     Alyse Low Roar Referring Phys:  [id number] Athena Masse Diagnosing Phys: Serafina Royals MD IMPRESSIONS  1. Left ventricular ejection fraction, by estimation, is 35 to 40%. The left ventricle has moderately decreased function. The left ventricle demonstrates global hypokinesis. Left ventricular diastolic parameters are consistent with Grade I diastolic dysfunction (impaired relaxation).  2. Right ventricular systolic function is normal. The right ventricular size is normal. There is mildly elevated pulmonary artery systolic pressure.  3. Left atrial size was mildly dilated.  4. Right atrial size was mildly dilated.  5. The mitral valve is normal in structure and function. Mild mitral valve regurgitation.  6. The aortic valve is normal in structure and function. Aortic valve regurgitation is trivial. Mild aortic valve stenosis. FINDINGS  Left Ventricle: Left ventricular ejection fraction, by estimation, is 35 to 40%. The left ventricle has moderately decreased function. The left ventricle demonstrates global hypokinesis. The left ventricular internal cavity size was normal in size. There is no left ventricular hypertrophy. Left ventricular diastolic parameters are consistent with Grade I diastolic dysfunction (impaired relaxation). Right Ventricle: The right ventricular size is normal. No increase in right ventricular wall thickness. Right ventricular systolic function is normal. There is mildly elevated pulmonary artery systolic pressure. The tricuspid regurgitant velocity is 2.72  m/s, and with an  assumed right atrial pressure of 10 mmHg, the estimated right ventricular systolic pressure is 51.0 mmHg. Left Atrium: Left atrial size was mildly dilated. Right Atrium:  Right atrial size was mildly dilated. Pericardium: There is no evidence of pericardial effusion. Mitral Valve: The mitral valve is normal in structure and function. Mild mitral valve regurgitation. Tricuspid Valve: The tricuspid valve is normal in structure. Tricuspid valve regurgitation is mild. Aortic Valve: The aortic valve is normal in structure and function. Aortic valve regurgitation is trivial. Aortic regurgitation PHT measures 447 msec. Mild aortic stenosis is present. Aortic valve mean gradient measures 11.5 mmHg. Aortic valve peak gradient measures 23.9 mmHg. Aortic valve area, by VTI measures 0.92 cm. Pulmonic Valve: The pulmonic valve was normal in structure. Pulmonic valve regurgitation is not visualized. Aorta: The aortic root and ascending aorta are structurally normal, with no evidence of dilitation. IAS/Shunts: No atrial level shunt detected by color flow Doppler.  LEFT VENTRICLE PLAX 2D LVIDd:         5.29 cm  Diastology LVIDs:         4.37 cm  LV e' lateral:   7.56 cm/s LV PW:         1.39 cm  LV E/e' lateral: 17.3 LV IVS:        1.63 cm  LV e' medial:    6.18 cm/s LVOT diam:     2.00 cm  LV E/e' medial:  21.2 LV SV:         47.44 ml LV SV Index:   24.20 LVOT Area:     3.14 cm  RIGHT VENTRICLE RV S prime:     9.69 cm/s LEFT ATRIUM             Index       RIGHT ATRIUM           Index LA diam:        4.70 cm 2.40 cm/m  RA Area:     15.20 cm LA Vol (A2C):   83.6 ml 42.65 ml/m RA Volume:   40.90 ml  20.87 ml/m LA Vol (A4C):   78.7 ml 40.15 ml/m LA Biplane Vol: 84.2 ml 42.96 ml/m  AORTIC VALVE                    PULMONIC VALVE AV Area (Vmax):    1.08 cm     PV Vmax:        1.00 m/s AV Area (Vmean):   1.21 cm     PV Peak grad:   4.0 mmHg AV Area (VTI):     0.92 cm     RVOT Peak grad: 3 mmHg AV Vmax:           244.50 cm/s AV  Vmean:          152.000 cm/s AV VTI:            0.518 m AV Peak Grad:      23.9 mmHg AV Mean Grad:      11.5 mmHg LVOT Vmax:         84.40 cm/s LVOT Vmean:        58.600 cm/s LVOT VTI:          0.151 m LVOT/AV VTI ratio: 0.29 AI PHT:            447 msec  AORTA Ao Root diam: 2.70 cm MITRAL VALVE                TRICUSPID VALVE MV Area (PHT): 4.49 cm     TR Peak grad:   29.6 mmHg MV Decel Time: 169 msec  TR Vmax:        272.00 cm/s MV E velocity: 131.00 cm/s                             SHUNTS                             Systemic VTI:  0.15 m                             Systemic Diam: 2.00 cm Serafina Royals MD Electronically signed by Serafina Royals MD Signature Date/Time: 12/27/2019/12:50:40 PM    Final         Scheduled Meds:  amLODipine  5 mg Oral Daily   aspirin EC  81 mg Oral Daily   budesonide (PULMICORT) nebulizer solution  0.5 mg Nebulization BID   cholecalciferol  1,000 Units Oral Daily   enoxaparin (LOVENOX) injection  40 mg Subcutaneous Q24H   furosemide  40 mg Intravenous Q12H   gabapentin  100 mg Oral BID   hydrALAZINE  50 mg Oral Q8H   insulin aspart  0-15 Units Subcutaneous TID WC   insulin aspart  0-5 Units Subcutaneous QHS   insulin glargine  20 Units Subcutaneous Daily   ipratropium-albuterol  3 mL Nebulization TID   isosorbide mononitrate  30 mg Oral Daily   magnesium oxide  200 mg Oral Daily   mouth rinse  15 mL Mouth Rinse BID   metoprolol tartrate  50 mg Oral BID   pantoprazole  40 mg Oral Daily   potassium chloride  10 mEq Oral Daily   Ensure Max Protein  11 oz Oral BID BM   simvastatin  20 mg Oral QHS   sodium chloride flush  3 mL Intravenous Q12H   Continuous Infusions:  sodium chloride       LOS: 2 days    Time spent: 35-40 minutes    Ezekiel Slocumb, DO [facility]   If 7PM-7AM, please contact night-coverage [email or web address] 12/28/2019, 11:12 AM

## 2019-12-28 NOTE — Consult Note (Signed)
[facility] Consultation Note  Patient ID: [name], MRN: [id number], DOB/AGE: Nov 03, 1946 74 y.o. Admit date: 12/26/2019   Date of Consult: 12/28/2019 Primary Physician: [name], MD Primary Cardiologist: Ubaldo Glassing  Chief Complaint:  Chief Complaint  Patient presents with  . Respiratory Distress   Reason for Consult: Heart failure  HPI: 74 y.o. male with known coronary disease status post coronary bypass graft and previous myocardial infarction with ejection fraction of 35% and recent echocardiogram showing an ejection fraction of 35% with moderate mitral regurgitation.  The patient also has chronic kidney disease with a glomerular filtration rate of 46 and sleep apnea diabetes hypertension hyperlipidemia.  He has had appropriate medication management for all these issues but recently has had significant progression of shortness of breath and lower extremity edema and abdominal edema.  With this he was seen in the emergency room for which he was found to have a BNP of 1145 and a troponin of 89 and a chest x-ray of pulmonary edema and congestive heart failure.  He did have appropriate medication management from home as well as intravenous Lasix which has improved his symptoms.  Additionally he has had new onset of atrial fibrillation with rapid ventricular rate with an EKG showing A. fib with septal and infarct with rapid rate and interventricular conduction defect.  This has complicated his current issues.  Currently he feels much better and has dynamically stable  Past Medical History:  Diagnosis Date  . Basal cell carcinoma 05/2019   right nasal ala, Tx: EDC  . CHF (congestive heart failure) ([place])   . COPD (chronic obstructive pulmonary disease) ([place])   . Coronary artery disease   . Diabetes mellitus without complication ([place])   . GERD (gastroesophageal reflux disease)   . HLD (hyperlipidemia)   . Hypertension   . Peripheral vascular disease ([place])   . Pneumonia   .  Prostate cancer ([place])   . Stroke [facility])       Surgical History:  Past Surgical History:  Procedure Laterality Date  . COLONOSCOPY    . CORONARY ARTERY BYPASS GRAFT  2006  . EYE SURGERY    . GOLD SEED IMPLANT N/A 12/30/2016   Procedure: GOLD SEED IMPLANT  x3;  Surgeon: Hollice Espy, MD;  Location: [facility] ORS;  Service: Urology;  Laterality: N/A;  . HEMORRHOID SURGERY N/A 10/02/2016   Procedure: PROCTOSCOPY AND CONTROL OF RECTAL BLEEDING;  Surgeon: Excell Seltzer, MD;  Location: [facility] ORS;  Service: General;  Laterality: N/A;  . PROSTATE BIOPSY    . TONSILLECTOMY       Home Meds: Prior to Admission medications   Medication Sig Start Date End Date Taking? Authorizing Provider  acetaminophen (TYLENOL) 325 MG tablet Take 2 tablets (650 mg total) by mouth every 6 (six) hours as needed for mild pain (or Fever >/= 101). 01/20/16  Yes Gouru, Illene Silver, MD  albuterol (PROVENTIL HFA;VENTOLIN HFA) 108 (90 Base) MCG/ACT inhaler Inhale 2 puffs into the lungs every 6 (six) hours as needed for wheezing or shortness of breath.   Yes [provider]  aspirin EC 81 MG tablet Take 81 mg by mouth daily.   Yes [provider]  bifidobacterium infantis (ALIGN) capsule Take 1 capsule by mouth every morning.   Yes [provider]  cholecalciferol (VITAMIN D) 1000 units tablet Take 1,000 Units by mouth daily.   Yes [provider]  Cinnamon 500 MG capsule Take 1,000 mg by mouth 2 (two) times daily.   Yes [provider]  clopidogrel (PLAVIX) 75 MG tablet Take 75 mg by mouth daily.  08/26/16  Yes [provider]  CRANBERRY EXTRACT PO Take 500 mg by mouth every morning.    Yes [provider]  furosemide (LASIX) 40 MG tablet Take 1 tablet (40 mg total) by mouth 2 (two) times daily. 06/27/18  Yes Schuyler Amor, MD  gabapentin (NEURONTIN) 100 MG capsule Take 100 mg by mouth 2 (two) times daily.   Yes [provider]  glucose 4 GM chewable tablet  Chew 1 tablet by mouth as needed for low blood sugar.   Yes [provider]  hydrALAZINE (APRESOLINE) 50 MG tablet Take 1 tablet (50 mg total) by mouth every 8 (eight) hours. 10/15/19  Yes Ghimire, Henreitta Leber, MD  insulin aspart (NOVOLOG) 100 UNIT/ML injection 0-9 Units, Subcutaneous, 3 times daily with meals CBG < 70: Implement Hypoglycemia protocol/measures CBG 70 - 120: 0 units CBG 121 - 150: 1 unit CBG 151 - 200: 2 units CBG 201 - 250: 3 units CBG 251 - 300: 5 units CBG 301 - 350: 7 units CBG 351 - 400: 9 units CBG > 400: call MD 10/15/19  Yes Ghimire, Henreitta Leber, MD  Ipratropium-Albuterol (COMBIVENT) 20-100 MCG/ACT AERS respimat Inhale 2 puffs into the lungs 4 (four) times daily as needed. 09/08/19 09/07/20 Yes [provider]  isosorbide mononitrate (IMDUR) 30 MG 24 hr tablet Take 1 tablet (30 mg total) by mouth daily. 10/15/19  Yes Ghimire, Henreitta Leber, MD  LANTUS SOLOSTAR 100 UNIT/ML Solostar Pen Inject 45 Units into the skin daily at 10 pm. 10/15/19  Yes Ghimire, Henreitta Leber, MD  loperamide (IMODIUM) 2 MG capsule Take 4 mg by mouth as needed for diarrhea or loose stools.   Yes [provider]  Magnesium 250 MG TABS Take 250 mg by mouth daily.   Yes [provider]  Misc Natural Products (TOTAL MEMORY & FOCUS FORMULA PO) Take 1 capsule by mouth See admin instructions. Take 2 capsule at 9:00 AM, 1 capsule at 5:00 PM, and 1 capsule at bedtime.   Yes [provider]  Multiple Vitamin (MULTIVITAMIN WITH MINERALS) TABS tablet Take 1 tablet by mouth daily.   Yes [provider]  multivitamin-lutein (OCUVITE-LUTEIN) CAPS capsule Take 1 capsule by mouth daily.   Yes [provider]  omeprazole (PRILOSEC) 20 MG capsule Take 20 mg by mouth daily.    Yes [provider]  potassium chloride (K-DUR) 10 MEQ tablet Take 10 mEq by mouth daily.   Yes [provider]  simvastatin (ZOCOR) 20 MG tablet Take 20 mg by mouth at bedtime.    Yes [provider]  SUPER B COMPLEX/C PO Take 1 tablet by mouth every morning.   Yes [provider]  vitamin C (ASCORBIC ACID) 500 MG tablet Take 500 mg by mouth 2 (two) times daily.    Yes [provider]    Inpatient Medications:  . amLODipine  5 mg Oral Daily  . aspirin EC  81 mg Oral Daily  . budesonide (PULMICORT) nebulizer solution  0.5 mg Nebulization BID  . cholecalciferol  1,000 Units Oral Daily  . clopidogrel  75 mg Oral Daily  . enoxaparin (LOVENOX) injection  40 mg Subcutaneous Q24H  . furosemide  40 mg Intravenous Q12H  . gabapentin  100 mg Oral BID  . hydrALAZINE  50 mg Oral Q8H  . insulin aspart  0-15 Units Subcutaneous TID WC  . insulin aspart  0-5  Units Subcutaneous QHS  . insulin glargine  20 Units Subcutaneous Daily  . ipratropium-albuterol  3 mL Nebulization TID  . isosorbide mononitrate  30 mg Oral Daily  . magnesium oxide  200 mg Oral Daily  . mouth rinse  15 mL Mouth Rinse BID  . pantoprazole  40 mg Oral Daily  . potassium chloride  10 mEq Oral Daily  . Ensure Max Protein  11 oz Oral BID BM  . simvastatin  20 mg Oral QHS  . sodium chloride flush  3 mL Intravenous Q12H   . sodium chloride      Allergies: No Known Allergies  Social History   Socioeconomic History  . Marital status: Married    Spouse name: Not on file  . Number of children: Not on file  . Years of education: Not on file  . Highest education level: Not on file  Occupational History  . Not on file  Tobacco Use  . Smoking status: Current Every Day Smoker    Packs/day: 1.00    Years: 54.00    Pack years: 54.00    Types: Cigarettes  . Smokeless tobacco: Never Used  Substance and Sexual Activity  . Alcohol use: No    Alcohol/week: 0.0 standard drinks  . Drug use: No  . Sexual activity: Yes  Other Topics Concern  . Not on file  Social History Narrative  . Not on file   Social Determinants of Health   Financial Resource Strain: Low Risk   .  Difficulty of Paying Living Expenses: Not very hard  Food Insecurity: No Food Insecurity  . Worried About Charity fundraiser in the Last Year: Never true  . Ran Out of Food in the Last Year: Never true  Transportation Needs: No Transportation Needs  . Lack of Transportation (Medical): No  . Lack of Transportation (Non-Medical): No  Physical Activity: Insufficiently Active  . Days of Exercise per Week: 1 day  . Minutes of Exercise per Session: 10 min  Stress: Stress Concern Present  . Feeling of Stress : To some extent  Social Connections: Moderately Isolated  . Frequency of Communication with Friends and Family: Once a week  . Frequency of Social Gatherings with Friends and Family: Once a week  . Attends Religious Services: Never  . Active Member of Clubs or Organizations: No  . Attends Archivist Meetings: Never  . Marital Status: Married  Human resources officer Violence: Not At Risk  . Fear of Current or Ex-Partner: No  . Emotionally Abused: No  . Physically Abused: No  . Sexually Abused: No     Family History  Problem Relation Age of Onset  . Lung cancer Mother   . Heart attack Father   . Hypertension Other   . Cancer Maternal Grandfather        prostate  . Cancer Paternal Grandmother        breast     Review of Systems Positive for shortness of breath edema Negative for: General:  chills, fever, night sweats or weight changes.  Cardiovascular: PND orthopnea syncope dizziness  Dermatological skin lesions rashes Respiratory: Cough congestion Urologic: Frequent urination urination at night and hematuria Abdominal: negative for nausea, vomiting, diarrhea, bright red blood per rectum, melena, or hematemesis Neurologic: negative for visual changes, and/or hearing changes  All other systems reviewed and are otherwise negative except as noted above.  Labs: No results for input(s): CKTOTAL, CKMB, TROPONINI in the last 72 hours. Lab Results  Component Value  Date    WBC 11.5 (H) 12/28/2019   HGB 13.5 12/28/2019   HCT 41.3 12/28/2019   MCV 87.3 12/28/2019   PLT 281 12/28/2019    Recent Labs  Lab 12/28/19 0548  NA 135  K 4.1  CL 97*  CO2 30  BUN 35*  CREATININE 1.67*  CALCIUM 8.6*  PROT 6.7  BILITOT 1.3*  ALKPHOS 91  ALT 24  AST 16  GLUCOSE 133*   No results found for: CHOL, HDL, LDLCALC, TRIG Lab Results  Component Value Date   DDIMER 0.44 10/15/2019    Radiology/Studies:  [facility] Chest Port 1 View  Result Date: 12/26/2019 CLINICAL DATA:  Shortness of breath for 1 hour EXAM: PORTABLE CHEST 1 VIEW COMPARISON:  10/12/2019 FINDINGS: Single frontal view of the chest demonstrates stable postsurgical changes from median sternotomy. The cardiac silhouette is unchanged. There is increased central vascular congestion with interval development of diffuse interstitial prominence and patchy bilateral perihilar airspace disease. Small left pleural effusion is noted. No pneumothorax. IMPRESSION: 1. Findings consistent with mild congestive heart failure.  To Electronically Signed   By: Randa Ngo M.D.   On: 12/26/2019 03:36   ECHOCARDIOGRAM COMPLETE  Result Date: 12/27/2019    ECHOCARDIOGRAM REPORT   Patient Name:   [name] Date of Exam: 12/26/2019 Medical Rec #:  [id number]  Height:       71.0 in Accession #:    [id number] Weight:       168.4 lb Date of Birth:  07-Jun-1946  BSA:          1.960 m Patient Age:    31 years   BP:           122/65 mmHg Patient Gender: M          HR:           78 bpm. Exam Location:  [facility] Procedure: 2D Echo, Cardiac Doppler and Color Doppler Indications:     CHF 428.31  History:         Patient has prior history of Echocardiogram examinations. CHF,                  CAD; Stroke.  Sonographer:     Alyse Low Roar Referring Phys:  [id number] Athena Masse Diagnosing Phys: Serafina Royals MD IMPRESSIONS  1. Left ventricular ejection fraction, by estimation, is 35 to 40%. The left ventricle has moderately decreased function. The left  ventricle demonstrates global hypokinesis. Left ventricular diastolic parameters are consistent with Grade I diastolic dysfunction (impaired relaxation).  2. Right ventricular systolic function is normal. The right ventricular size is normal. There is mildly elevated pulmonary artery systolic pressure.  3. Left atrial size was mildly dilated.  4. Right atrial size was mildly dilated.  5. The mitral valve is normal in structure and function. Mild mitral valve regurgitation.  6. The aortic valve is normal in structure and function. Aortic valve regurgitation is trivial. Mild aortic valve stenosis. FINDINGS  Left Ventricle: Left ventricular ejection fraction, by estimation, is 35 to 40%. The left ventricle has moderately decreased function. The left ventricle demonstrates global hypokinesis. The left ventricular internal cavity size was normal in size. There is no left ventricular hypertrophy. Left ventricular diastolic parameters are consistent with Grade I diastolic dysfunction (impaired relaxation). Right Ventricle: The right ventricular size is normal. No increase in right ventricular wall thickness. Right ventricular systolic function is normal. There is mildly elevated pulmonary artery systolic pressure. The tricuspid regurgitant velocity is 2.72  m/s, and with an assumed right atrial pressure of 10 mmHg, the estimated right ventricular systolic pressure is 39.0 mmHg. Left Atrium: Left atrial size was mildly dilated. Right Atrium: Right atrial size was mildly dilated. Pericardium: There is no evidence of pericardial effusion. Mitral Valve: The mitral valve is normal in structure and function. Mild mitral valve regurgitation. Tricuspid Valve: The tricuspid valve is normal in structure. Tricuspid valve regurgitation is mild. Aortic Valve: The aortic valve is normal in structure and function. Aortic valve regurgitation is trivial. Aortic regurgitation PHT measures 447 msec. Mild aortic stenosis is present. Aortic  valve mean gradient measures 11.5 mmHg. Aortic valve peak gradient measures 23.9 mmHg. Aortic valve area, by VTI measures 0.92 cm. Pulmonic Valve: The pulmonic valve was normal in structure. Pulmonic valve regurgitation is not visualized. Aorta: The aortic root and ascending aorta are structurally normal, with no evidence of dilitation. IAS/Shunts: No atrial level shunt detected by color flow Doppler.  LEFT VENTRICLE PLAX 2D LVIDd:         5.29 cm  Diastology LVIDs:         4.37 cm  LV e' lateral:   7.56 cm/s LV PW:         1.39 cm  LV E/e' lateral: 17.3 LV IVS:        1.63 cm  LV e' medial:    6.18 cm/s LVOT diam:     2.00 cm  LV E/e' medial:  21.2 LV SV:         47.44 ml LV SV Index:   24.20 LVOT Area:     3.14 cm  RIGHT VENTRICLE RV S prime:     9.69 cm/s LEFT ATRIUM             Index       RIGHT ATRIUM           Index LA diam:        4.70 cm 2.40 cm/m  RA Area:     15.20 cm LA Vol (A2C):   83.6 ml 42.65 ml/m RA Volume:   40.90 ml  20.87 ml/m LA Vol (A4C):   78.7 ml 40.15 ml/m LA Biplane Vol: 84.2 ml 42.96 ml/m  AORTIC VALVE                    PULMONIC VALVE AV Area (Vmax):    1.08 cm     PV Vmax:        1.00 m/s AV Area (Vmean):   1.21 cm     PV Peak grad:   4.0 mmHg AV Area (VTI):     0.92 cm     RVOT Peak grad: 3 mmHg AV Vmax:           244.50 cm/s AV Vmean:          152.000 cm/s AV VTI:            0.518 m AV Peak Grad:      23.9 mmHg AV Mean Grad:      11.5 mmHg LVOT Vmax:         84.40 cm/s LVOT Vmean:        58.600 cm/s LVOT VTI:          0.151 m LVOT/AV VTI ratio: 0.29 AI PHT:            447 msec  AORTA Ao Root diam: 2.70 cm MITRAL VALVE  TRICUSPID VALVE MV Area (PHT): 4.49 cm     TR Peak grad:   29.6 mmHg MV Decel Time: 169 msec     TR Vmax:        272.00 cm/s MV E velocity: 131.00 cm/s                             SHUNTS                             Systemic VTI:  0.15 m                             Systemic Diam: 2.00 cm Serafina Royals MD Electronically signed by Serafina Royals MD  Signature Date/Time: 12/27/2019/12:50:40 PM    Final     EKG: Atrial fibrillation with rapid ventricular rate septal myocardial infarction with interventricular conduction defect  Weights: Filed Weights   12/27/19 0500 12/27/19 2030 12/28/19 0435  Weight: 75.5 kg 75.8 kg 74.4 kg     Physical Exam: Blood pressure 132/81, pulse (!) 59, temperature 98.5 F (36.9 C), temperature source Oral, resp. rate 17, height 5\' 11"  (1.803 m), weight 74.4 kg, SpO2 95 %. Body mass index is 22.89 kg/m. General: Well developed, well nourished, in no acute distress. Head eyes ears nose throat: Normocephalic, atraumatic, sclera non-icteric, no xanthomas, nares are without discharge. No apparent thyromegaly and/or mass  Lungs: Normal respiratory effort.  You wheezes, basilar rales, no rhonchi.  Heart: Irregular with normal S1 S2. no murmur gallop, no rub, PMI is normal size and placement, carotid upstroke normal without bruit, jugular venous pressure is normal Abdomen: Soft, non-tender,  distended with normoactive bowel sounds. No hepatomegaly. No rebound/guarding. No obvious abdominal masses. Abdominal aorta is normal size without bruit Extremities: 2+ edema. no cyanosis, no clubbing, no ulcers  Peripheral : 2+ bilateral upper extremity pulses, 2+ bilateral femoral pulses, 2+ bilateral dorsal pedal pulse Neuro: Alert and oriented. No facial asymmetry. No focal deficit. Moves all extremities spontaneously. Musculoskeletal: Normal muscle tone without kyphosis Psych:  Responds to questions appropriately with a normal affect.    Assessment: 74 year old male with coronary bypass graft diabetes hypertension hyperlipidemia chronic kidney disease sleep apnea with acute on chronic systolic dysfunction congestive heart failure and minimal elevation of troponin consistent with demand ischemia rather than acute coronary syndrome  Plan: 1.  Continue intravenous Lasix for significant pulmonary edema ascites and lower  extremity edema and acute on chronic systolic dysfunction congestive heart failure 2.  Heart rate control of atrial fibrillation with increased dose of metoprolol with a goal heart rate below 100 bpm 3.  Further consideration of discontinuation of dual antiplatelet therapy and substituted for anticoagulation for further risk reduction stroke with atrial fibrillation 4.  Continue other medication management for risk reduction in cardiovascular event with further adjustments after above  Signed, Corey Skains M.D. [facility] 12/28/2019, 9:24 AM

## 2019-12-29 LAB — BASIC METABOLIC PANEL
Anion gap: 9 (ref 5–15)
BUN: 45 mg/dL — ABNORMAL HIGH (ref 8–23)
CO2: 28 mmol/L (ref 22–32)
Calcium: 8.5 mg/dL — ABNORMAL LOW (ref 8.9–10.3)
Chloride: 95 mmol/L — ABNORMAL LOW (ref 98–111)
Creatinine, Ser: 1.99 mg/dL — ABNORMAL HIGH (ref 0.61–1.24)
GFR calc Af Amer: 37 mL/min — ABNORMAL LOW (ref >60)
GFR calc non Af Amer: 32 mL/min — ABNORMAL LOW (ref >60)
Glucose, Bld: 197 mg/dL — ABNORMAL HIGH (ref 70–99)
Potassium: 4.2 mmol/L (ref 3.5–5.1)
Sodium: 132 mmol/L — ABNORMAL LOW (ref 135–145)

## 2019-12-29 LAB — MAGNESIUM: Magnesium: 2.6 mg/dL — ABNORMAL HIGH (ref 1.7–2.4)

## 2019-12-29 LAB — GLUCOSE, CAPILLARY: Glucose-Capillary: 201 mg/dL — ABNORMAL HIGH (ref 70–99)

## 2019-12-29 MED ORDER — METOPROLOL TARTRATE 25 MG PO TABS: 12.5000 mg | ORAL_TABLET | Freq: Two times a day (BID) | ORAL | 0 refills | Status: DC

## 2019-12-29 MED ORDER — FUROSEMIDE 40 MG PO TABS
60.0000 mg | ORAL_TABLET | Freq: Two times a day (BID) | ORAL | Status: DC
  Administered 2019-12-29: 60 mg via ORAL
  Filled 2019-12-29: qty 1

## 2019-12-29 MED ORDER — FUROSEMIDE 20 MG PO TABS: 60.0000 mg | ORAL_TABLET | Freq: Two times a day (BID) | ORAL | 1 refills | Status: DC

## 2019-12-29 MED ORDER — AMLODIPINE BESYLATE 5 MG PO TABS: 5.0000 mg | ORAL_TABLET | Freq: Every day | ORAL | 1 refills | Status: DC

## 2019-12-29 MED ORDER — APIXABAN 5 MG PO TABS: 5.0000 mg | ORAL_TABLET | Freq: Two times a day (BID) | ORAL | 1 refills | Status: DC

## 2019-12-29 MED ORDER — ENSURE MAX PROTEIN PO LIQD: 11.0000 [oz_av] | Freq: Two times a day (BID) | ORAL | Status: DC

## 2019-12-29 NOTE — Progress Notes (Signed)
[facility] Encounter Note  Patient: [name] / Admit Date: 12/26/2019 / Date of Encounter: 12/29/2019, 8:48 AM   Subjective: Patient is breathing much better at this time.  No evidence of significant new symptoms.  Patient is ambulating without evidence of significant new chest pain or shortness of breath.  Patient has lost weight with intravenous Lasix.  Atrial fibrillation heart rate controlled.  No evidence of myocardial infarction or acute coronary syndrome with some demand ischemia with minimal elevation of troponin.  Echocardiogram confirming moderate to severe LV systolic dysfunction with some valvular disease as before  Review of Systems: Positive for: Shortness of breath Negative for: Vision change, hearing change, syncope, dizziness, nausea, vomiting,diarrhea, bloody stool, stomach pain, cough, congestion, diaphoresis, urinary frequency, urinary pain,skin lesions, skin rashes Others previously listed  Objective: Telemetry: Atrial fibrillation controlled ventricular rate Physical Exam: Blood pressure 133/64, pulse (!) 55, temperature 98.2 F (36.8 C), temperature source Oral, resp. rate 16, height 5\' 11"  (1.803 m), weight 69.1 kg, SpO2 94 %. Body mass index is 21.26 kg/m. General: Well developed, well nourished, in no acute distress. Head: Normocephalic, atraumatic, sclera non-icteric, no xanthomas, nares are without discharge. Neck: No apparent masses Lungs: Normal respirations with no wheezes, no rhonchi, basilar rales , some crackles   Heart: Irregular rate and rhythm, normal S1 S2, no murmur, no rub, no gallop, PMI is normal size and placement, carotid upstroke normal without bruit, jugular venous pressure normal Abdomen: Soft, non-tender, non-distended with normoactive bowel sounds. No hepatosplenomegaly. Abdominal aorta is normal size without bruit Extremities: Trace to 1+ edema, no clubbing, no cyanosis, no ulcers,  Peripheral: 2+ radial, 2+ femoral,  2+ dorsal pedal pulses Neuro: Alert and oriented. Moves all extremities spontaneously. Psych:  Responds to questions appropriately with a normal affect.   Intake/Output Summary (Last 24 hours) at 12/29/2019 0848 Last data filed at 12/29/2019 9147 Gross per 24 hour  Intake 1197 ml  Output 2350 ml  Net -1153 ml    Inpatient Medications:  . amLODipine  5 mg Oral Daily  . apixaban  5 mg Oral BID  . aspirin EC  81 mg Oral Daily  . budesonide (PULMICORT) nebulizer solution  0.5 mg Nebulization BID  . cholecalciferol  1,000 Units Oral Daily  . furosemide  60 mg Oral BID  . gabapentin  100 mg Oral BID  . hydrALAZINE  50 mg Oral Q8H  . insulin aspart  0-15 Units Subcutaneous TID WC  . insulin aspart  0-5 Units Subcutaneous QHS  . insulin glargine  20 Units Subcutaneous Daily  . ipratropium-albuterol  3 mL Nebulization TID  . isosorbide mononitrate  30 mg Oral Daily  . magnesium oxide  200 mg Oral Daily  . mouth rinse  15 mL Mouth Rinse BID  . metoprolol tartrate  12.5 mg Oral BID  . pantoprazole  40 mg Oral Daily  . potassium chloride  10 mEq Oral Daily  . Ensure Max Protein  11 oz Oral BID BM  . simvastatin  20 mg Oral QHS  . sodium chloride flush  3 mL Intravenous Q12H   Infusions:  . sodium chloride      Labs: Recent Labs    12/28/19 0548 12/29/19 0426  NA 135 132*  K 4.1 4.2  CL 97* 95*  CO2 30 28  GLUCOSE 133* 197*  BUN 35* 45*  CREATININE 1.67* 1.99*  CALCIUM 8.6* 8.5*  MG 2.2 2.6*   Recent Labs    12/28/19 0548  AST  16  ALT 24  ALKPHOS 91  BILITOT 1.3*  PROT 6.7  ALBUMIN 3.3*   Recent Labs    12/28/19 0548  WBC 11.5*  HGB 13.5  HCT 41.3  MCV 87.3  PLT 281   No results for input(s): CKTOTAL, CKMB, TROPONINI in the last 72 hours. Invalid input(s): POCBNP No results for input(s): HGBA1C in the last 72 hours.   Weights: Filed Weights   12/27/19 2030 12/28/19 0435 12/29/19 0326  Weight: 75.8 kg 74.4 kg 69.1 kg     Radiology/Studies:  DG  Chest Port 1 View  Result Date: 12/26/2019 CLINICAL DATA:  Shortness of breath for 1 hour EXAM: PORTABLE CHEST 1 VIEW COMPARISON:  10/12/2019 FINDINGS: Single frontal view of the chest demonstrates stable postsurgical changes from median sternotomy. The cardiac silhouette is unchanged. There is increased central vascular congestion with interval development of diffuse interstitial prominence and patchy bilateral perihilar airspace disease. Small left pleural effusion is noted. No pneumothorax. IMPRESSION: 1. Findings consistent with mild congestive heart failure.  To Electronically Signed   By: [name] M.D.   On: 12/26/2019 03:36   ECHOCARDIOGRAM COMPLETE  Result Date: 12/27/2019    ECHOCARDIOGRAM REPORT   Patient Name:   [name] Date of Exam: 12/26/2019 Medical Rec #:  [id number]  Height:       71.0 in Accession #:    [id number] Weight:       168.4 lb Date of Birth:  [birth year]  BSA:          1.960 m Patient Age:    33 years   BP:           122/65 mmHg Patient Gender: M          HR:           78 bpm. Exam Location:  [facility] Procedure: 2D Echo, Cardiac Doppler and Color Doppler Indications:     CHF 428.31  History:         Patient has prior history of Echocardiogram examinations. CHF,                  CAD; Stroke.  Sonographer:     [name] Referring Phys:  [id number] [name] Diagnosing Phys: Barry Royals MD IMPRESSIONS  1. Left ventricular ejection fraction, by estimation, is 35 to 40%. The left ventricle has moderately decreased function. The left ventricle demonstrates global hypokinesis. Left ventricular diastolic parameters are consistent with Grade I diastolic dysfunction (impaired relaxation).  2. Right ventricular systolic function is normal. The right ventricular size is normal. There is mildly elevated pulmonary artery systolic pressure.  3. Left atrial size was mildly dilated.  4. Right atrial size was mildly dilated.  5. The mitral valve is normal in structure and function. Mild  mitral valve regurgitation.  6. The aortic valve is normal in structure and function. Aortic valve regurgitation is trivial. Mild aortic valve stenosis. FINDINGS  Left Ventricle: Left ventricular ejection fraction, by estimation, is 35 to 40%. The left ventricle has moderately decreased function. The left ventricle demonstrates global hypokinesis. The left ventricular internal cavity size was normal in size. There is no left ventricular hypertrophy. Left ventricular diastolic parameters are consistent with Grade I diastolic dysfunction (impaired relaxation). Right Ventricle: The right ventricular size is normal. No increase in right ventricular wall thickness. Right ventricular systolic function is normal. There is mildly elevated pulmonary artery systolic pressure. The tricuspid regurgitant velocity is 2.72  m/s, and with an assumed right atrial  pressure of 10 mmHg, the estimated right ventricular systolic pressure is 45.4 mmHg. Left Atrium: Left atrial size was mildly dilated. Right Atrium: Right atrial size was mildly dilated. Pericardium: There is no evidence of pericardial effusion. Mitral Valve: The mitral valve is normal in structure and function. Mild mitral valve regurgitation. Tricuspid Valve: The tricuspid valve is normal in structure. Tricuspid valve regurgitation is mild. Aortic Valve: The aortic valve is normal in structure and function. Aortic valve regurgitation is trivial. Aortic regurgitation PHT measures 447 msec. Mild aortic stenosis is present. Aortic valve mean gradient measures 11.5 mmHg. Aortic valve peak gradient measures 23.9 mmHg. Aortic valve area, by VTI measures 0.92 cm. Pulmonic Valve: The pulmonic valve was normal in structure. Pulmonic valve regurgitation is not visualized. Aorta: The aortic root and ascending aorta are structurally normal, with no evidence of dilitation. IAS/Shunts: No atrial level shunt detected by color flow Doppler.  LEFT VENTRICLE PLAX 2D LVIDd:         5.29 cm   Diastology LVIDs:         4.37 cm  LV e' lateral:   7.56 cm/s LV PW:         1.39 cm  LV E/e' lateral: 17.3 LV IVS:        1.63 cm  LV e' medial:    6.18 cm/s LVOT diam:     2.00 cm  LV E/e' medial:  21.2 LV SV:         47.44 ml LV SV Index:   24.20 LVOT Area:     3.14 cm  RIGHT VENTRICLE RV S prime:     9.69 cm/s LEFT ATRIUM             Index       RIGHT ATRIUM           Index LA diam:        4.70 cm 2.40 cm/m  RA Area:     15.20 cm LA Vol (A2C):   83.6 ml 42.65 ml/m RA Volume:   40.90 ml  20.87 ml/m LA Vol (A4C):   78.7 ml 40.15 ml/m LA Biplane Vol: 84.2 ml 42.96 ml/m  AORTIC VALVE                    PULMONIC VALVE AV Area (Vmax):    1.08 cm     PV Vmax:        1.00 m/s AV Area (Vmean):   1.21 cm     PV Peak grad:   4.0 mmHg AV Area (VTI):     0.92 cm     RVOT Peak grad: 3 mmHg AV Vmax:           244.50 cm/s AV Vmean:          152.000 cm/s AV VTI:            0.518 m AV Peak Grad:      23.9 mmHg AV Mean Grad:      11.5 mmHg LVOT Vmax:         84.40 cm/s LVOT Vmean:        58.600 cm/s LVOT VTI:          0.151 m LVOT/AV VTI ratio: 0.29 AI PHT:            447 msec  AORTA Ao Root diam: 2.70 cm MITRAL VALVE                TRICUSPID VALVE MV Area (  PHT): 4.49 cm     TR Peak grad:   29.6 mmHg MV Decel Time: 169 msec     TR Vmax:        272.00 cm/s MV E velocity: 131.00 cm/s                             SHUNTS                             Systemic VTI:  0.15 m                             Systemic Diam: 2.00 cm Barry Royals MD Electronically signed by Barry Royals MD Signature Date/Time: 12/27/2019/12:50:40 PM    Final      Assessment and Recommendation  74 y.o. 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  Continue medication management for acute on chronic systolic dysfunction congestive heart failure with intravenous Lasix and change over to higher dose oral Lasix 2.  Continue heart rate control with current medical regimen which appears to be improved with ambulation 3.  No further cardiac diagnostics necessary at this time 4.  Continue anticoagulation for further risk reduction in stroke with atrial fibrillation 5.  If ambulating well on current medical regimen and stable without further significant symptoms okay for discharge to home from the cardiac standpoint with follow-up next week for further adjustments of medication management  Signed, [name] M.D. FACC

## 2019-12-29 NOTE — ACP (Advance Care Planning) (Signed)
Chaplain educated pt. and wife Bethena Roys) about Advanced Directive and assisted in filling out the form, having pt. initial wherever necessary.  Pt. indicated in Living Will that he would not like heroic measures to prolong life @ the time when medical team determines dying process has begun, but pt. would like curative treatment if recovery seems possible.  Pt. authorizes [facility]CPOA Rito Lecomte) to act independently from Living Will @ her discretion.

## 2019-12-29 NOTE — Care Management Important Message (Signed)
Important Message  Patient Details  Name: [name] MRN: [id number] Date of Birth: May 02, 1946   Medicare Important Message Given:  No  Patient discharged prior to arrival to unit to deliver concurrent Medicare IM.     Dannette Barbara 12/29/2019, 11:58 AM

## 2019-12-29 NOTE — Discharge Summary (Signed)
Physician Discharge Summary  Asbury Hair IZT:[id number] DOB: 09-07-1946 DOA: 12/26/2019  PCP: [name], MD  Admit date: 12/26/2019 Discharge date: 12/29/2019  Admitted From: home Disposition:  home  Recommendations for Outpatient Follow-up:  1. Follow up with PCP in 1-2 weeks 2. Please obtain BMP/CBC in one week 3. Please follow up with cardiology as scheduled.  Home Health: yes RN, PT, OT Equipment/Devices: none, already has rolling walker   Discharge Condition: stable  CODE STATUS: full  Diet recommendation: Heart Healthy / Carb Modified   Brief/Interim Summary:  Wlliam Russis a 74 y.o.malewithwith medical history significant forsystolicheart failure, last EF 35 to 40% in December 2020, CAD status post CABG, COPD, CKD 3B, diabetes, CVA, hospitalized in December 2020 for respiratory failure secondary to Covid pneumonia and heart failure exacerbationwho wasbrought in [facility]in severe respiratory distress and on BiPAP after being found withO2 satsin60's%.Patient reported acute onset of SOB that woke him from sleep at 2 AM. In the ED, BP 207/120, RR 37 100% spO2 on bipap with FiO2 40%. ECG without acute ST-T wave changes. Chest xray consistent with pulmonary edema. Treated in the ED with nitropaste, morphine and Lasix with improvement in respiratory status but still required bipap. Admitted to hospitalist service for further management.   New onset Paroxysmal Atrial Fibrillation - noted on telemetry this AM.  Patient asymptomatic.  CHA2DS2-VASc score of 7 (11.2% stroke risk per year). --Cardiology consulted, increased beta blocker dose, consider full anticoagulation in place of DAPT, close follow up with Dr. Ubaldo Glassing on discharge --Started on Eliquis 5 mg PO BID as CHADS2-VASC score is 7 very high stroke risk and already has history of stroke in past.  Stop Plavix for now.  Re-address with cardiology in follow-up.  Acute respiratory failure with hypoxiasecondary  to Acute decompensation of systolic CHF and Hypertensive emergency Patient does have history of COPD, but not acutely exacerbated, no wheezing on exam.BP was 270/120 on arrival. Chest x-ray with pulmonary vascular congestion. BNP elevated at 1145.Mildly elevated troponin likely demand ischemia. No ischemic changes on EKG, no chest pain. --Net fluid balance: -5559 ccfor this admission --weaned off oxygen --transition to PO lasix --Not on beta-blocker, presumably due to history of second-degree heart block --Strict I/O's,daily weights, low-sodium diet and fluid restriction --Hold off on ACE inhibitor for now due to renal insufficiency --Continue home hydralazine and Imdur BP remains elevated today despite home meds  --add amlodipine 5 mg PO daily  History of second degree heart block --Monitor on telemetry --Not on beta-blocker for CHF, resumed this admission due to a-fib. --ECG in follow up with cardiology  COPD withmildacute exacerbationon admission,exacerbation resolved -Continue duo nebs scheduled --Continueas needed albuterol --Pulmicort nebs twice daily --DiscontinueRocephin and azithromycin,as procalcitonin negative  Elevated troponin patient History of CAD status post CABG Troponins trended and flat.First troponin elevated at 57,patient has no chest pain, -Continue aspirin, Plavix, simvastatin. --Not on beta-blockerdue toto history of heart block  Hyperglycemia due to type 2 diabetes mellitus -Blood sugar was 429 -Continue sliding scale insulin for now.  Chronic kidney disease (CKD) stage G3b -At baseline  History of CVA (cerebrovascular accident),likely embolic with underlying a-fib -Continue aspirin, Plavix and simvastatin  Elevated LFTs -Suspect related to hepatic congestion from heart failure exacerbation   Discharge Diagnoses: Principal Problem:   Acute respiratory failure with hypoxia (HCC) Active Problems:   Chronic  systolic heart failure (HCC)   COPD with acute exacerbation (HCC)   Acute systolic CHF (congestive heart failure) ([place])   Hypertensive emergency  Hyperglycemia due to type 2 diabetes mellitus (HCC)   Chronic kidney disease (CKD) stage G3b/A3, moderately decreased glomerular filtration rate (GFR) between 30-44 mL/min/1.73 square meter and albuminuria creatinine ratio greater than 300 mg/g   History of COVID-19   History of CVA (cerebrovascular accident)   Hx of CABG   History of second degree heart block   Elevated LFTs   Malnutrition of moderate degree    Discharge Instructions   Discharge Instructions    (HEART FAILURE PATIENTS) Call MD:  Anytime you have any of the following symptoms: 1) 3 pound weight gain in 24 hours or 5 pounds in 1 week 2) shortness of breath, with or without a dry hacking cough 3) swelling in the hands, feet or stomach 4) if you have to sleep on extra pillows at night in order to breathe.   Complete by: As directed    Call MD for:  extreme fatigue   Complete by: As directed    Diet - low sodium heart healthy   Complete by: As directed    Discharge instructions   Complete by: As directed    Take Eliquis (aka apixaban) twice daily.  This medicine is to prevent stroke because you have an abnormal heart rhythm called A-fib.  You should STOP PLAVIX because it will increase your risk of bleeding if taken with Eliquis.  See Dr. Ubaldo Glassing as soon as possible next week for follow up regarding medication changes.   Increase activity slowly   Complete by: As directed      Allergies as of 12/29/2019   No Known Allergies     Medication List    STOP taking these medications   clopidogrel 75 MG tablet Commonly known as: PLAVIX     TAKE these medications   acetaminophen 325 MG tablet Commonly known as: TYLENOL Take 2 tablets (650 mg total) by mouth every 6 (six) hours as needed for mild pain (or Fever >/= 101).   albuterol 108 (90 Base) MCG/ACT inhaler Commonly  known as: VENTOLIN HFA Inhale 2 puffs into the lungs every 6 (six) hours as needed for wheezing or shortness of breath.   amLODipine 5 MG tablet Commonly known as: NORVASC Take 1 tablet (5 mg total) by mouth daily. Start taking on: December 30, 2019   apixaban 5 MG Tabs tablet Commonly known as: ELIQUIS Take 1 tablet (5 mg total) by mouth 2 (two) times daily.   aspirin EC 81 MG tablet Take 81 mg by mouth daily.   bifidobacterium infantis capsule Take 1 capsule by mouth every morning.   cholecalciferol 1000 units tablet Commonly known as: VITAMIN D Take 1,000 Units by mouth daily.   Cinnamon 500 MG capsule Take 1,000 mg by mouth 2 (two) times daily.   CRANBERRY EXTRACT PO Take 500 mg by mouth every morning.   Ensure Max Protein Liqd Take 330 mLs (11 oz total) by mouth 2 (two) times daily between meals.   furosemide 20 MG tablet Commonly known as: LASIX Take 3 tablets (60 mg total) by mouth 2 (two) times daily. What changed:   medication strength  how much to take   gabapentin 100 MG capsule Commonly known as: NEURONTIN Take 100 mg by mouth 2 (two) times daily.   glucose 4 GM chewable tablet Chew 1 tablet by mouth as needed for low blood sugar.   hydrALAZINE 50 MG tablet Commonly known as: APRESOLINE Take 1 tablet (50 mg total) by mouth every 8 (eight) hours.   insulin aspart  100 UNIT/ML injection Commonly known as: novoLOG 0-9 Units, Subcutaneous, 3 times daily with meals CBG < 70: Implement Hypoglycemia protocol/measures CBG 70 - 120: 0 units CBG 121 - 150: 1 unit CBG 151 - 200: 2 units CBG 201 - 250: 3 units CBG 251 - 300: 5 units CBG 301 - 350: 7 units CBG 351 - 400: 9 units CBG > 400: call MD   Ipratropium-Albuterol 20-100 MCG/ACT Aers respimat Commonly known as: COMBIVENT Inhale 2 puffs into the lungs 4 (four) times daily as needed.   isosorbide mononitrate 30 MG 24 hr tablet Commonly known as: IMDUR Take 1 tablet (30 mg total) by mouth  daily.   Lantus SoloStar 100 UNIT/ML Solostar Pen Generic drug: Insulin Glargine Inject 45 Units into the skin daily at 10 pm.   loperamide 2 MG capsule Commonly known as: IMODIUM Take 4 mg by mouth as needed for diarrhea or loose stools.   Magnesium 250 MG Tabs Take 250 mg by mouth daily.   metoprolol tartrate 25 MG tablet Commonly known as: LOPRESSOR Take 0.5 tablets (12.5 mg total) by mouth 2 (two) times daily.   multivitamin with minerals Tabs tablet Take 1 tablet by mouth daily.   multivitamin-lutein Caps capsule Take 1 capsule by mouth daily.   omeprazole 20 MG capsule Commonly known as: PRILOSEC Take 20 mg by mouth daily.   potassium chloride 10 MEQ tablet Commonly known as: KLOR-CON Take 10 mEq by mouth daily.   simvastatin 20 MG tablet Commonly known as: ZOCOR Take 20 mg by mouth at bedtime.   SUPER B COMPLEX/C PO Take 1 tablet by mouth every morning.   TOTAL MEMORY & FOCUS FORMULA PO Take 1 capsule by mouth See admin instructions. Take 2 capsule at 9:00 AM, 1 capsule at 5:00 PM, and 1 capsule at bedtime.   vitamin C 500 MG tablet Commonly known as: ASCORBIC ACID Take 500 mg by mouth 2 (two) times daily.            Durable Medical Equipment  (From admission, onward)         Start     Ordered   12/29/19 0949  For home use only DME Walker rolling  Once    Question Answer Comment  Walker: With 5 Inch Wheels   Patient needs a walker to treat with the following condition Generalized weakness      12/29/19 0948         Follow-up Information    [facility] Follow up on 01/05/2020.   Specialty: Cardiology Why: at 1:00pm. Enter through the [place] entrance Contact information: [address]       [name], MD. Schedule an appointment as soon as possible for a visit.   Specialty: Family Medicine Why: Please schedule PCP  appointment for within 5-7 days of discharge date. Contact information: [address] [facility] [place] [address] [phone]        Teodoro Spray, MD. Schedule an appointment as soon as possible for a visit in 1 week(s).   Specialty: Cardiology Contact information: [place] [address] [phone]          No Known Allergies  Consultations:  cardiology    Procedures/Studies: DG Chest Port 1 View  Result Date: 12/26/2019 CLINICAL DATA:  Shortness of breath for 1 hour EXAM: PORTABLE CHEST 1 VIEW COMPARISON:  10/12/2019 FINDINGS: Single frontal view of the chest demonstrates stable postsurgical changes from median sternotomy. The cardiac silhouette is unchanged. There is increased central vascular congestion with interval development of diffuse interstitial prominence and patchy bilateral perihilar airspace disease. Small left pleural effusion is noted. No pneumothorax. IMPRESSION: 1. Findings consistent with mild congestive heart failure.  To Electronically Signed   By: Randa Ngo M.D.   On: 12/26/2019 03:36   ECHOCARDIOGRAM COMPLETE  Result Date: 12/27/2019    ECHOCARDIOGRAM REPORT   Patient Name:   BUD KAESER Date of Exam: 12/26/2019 Medical Rec #:  [id number]  Height:       71.0 in Accession #:    [id number] Weight:       168.4 lb Date of Birth:  03-Dec-1945  BSA:          1.960 m Patient Age:    74 years   BP:           122/65 mmHg Patient Gender: M          HR:           78 bpm. Exam Location:  [facility] Procedure: 2D Echo, Cardiac Doppler and Color Doppler Indications:     CHF 428.31  History:         Patient has prior history of Echocardiogram examinations. CHF,                  CAD; Stroke.  Sonographer:     Alyse Low Roar Referring Phys:  [id number] Athena Masse Diagnosing Phys: Serafina Royals MD IMPRESSIONS  1. Left ventricular ejection fraction, by estimation, is 35 to 40%. The left ventricle has moderately  decreased function. The left ventricle demonstrates global hypokinesis. Left ventricular diastolic parameters are consistent with Grade I diastolic dysfunction (impaired relaxation).  2. Right ventricular systolic function is normal. The right ventricular size is normal. There is mildly elevated pulmonary artery systolic pressure.  3. Left atrial size was mildly dilated.  4. Right atrial size was mildly dilated.  5. The mitral valve is normal in structure and function. Mild mitral valve regurgitation.  6. The aortic valve is normal in structure and function. Aortic valve regurgitation is trivial. Mild aortic valve stenosis. FINDINGS  Left Ventricle: Left ventricular ejection fraction, by estimation, is 35 to 40%. The left ventricle has moderately decreased function. The left ventricle demonstrates global hypokinesis. The left ventricular internal cavity size was normal in size. There is no left ventricular hypertrophy. Left ventricular diastolic parameters are consistent with Grade I diastolic dysfunction (impaired relaxation). Right Ventricle: The right ventricular size is normal. No increase in right ventricular wall thickness. Right ventricular systolic function is normal. There is mildly elevated pulmonary artery systolic pressure. The tricuspid regurgitant velocity is 2.72  m/s, and with an assumed right atrial pressure of 10 mmHg, the estimated right ventricular systolic pressure is 79.3 mmHg. Left Atrium: Left atrial size was mildly dilated. Right Atrium: Right atrial size was mildly dilated. Pericardium: There is no evidence of pericardial effusion. Mitral Valve: The mitral valve is normal in structure and function. Mild mitral valve regurgitation. Tricuspid Valve: The tricuspid valve is normal in structure. Tricuspid valve regurgitation is mild. Aortic Valve: The aortic valve is normal in structure and function. Aortic valve regurgitation is trivial. Aortic regurgitation PHT measures 447 msec. Mild aortic  stenosis is present. Aortic valve mean gradient measures 11.5 mmHg. Aortic valve peak gradient measures 23.9 mmHg. Aortic valve area, by VTI measures 0.92 cm. Pulmonic Valve: The pulmonic  valve was normal in structure. Pulmonic valve regurgitation is not visualized. Aorta: The aortic root and ascending aorta are structurally normal, with no evidence of dilitation. IAS/Shunts: No atrial level shunt detected by color flow Doppler.  LEFT VENTRICLE PLAX 2D LVIDd:         5.29 cm  Diastology LVIDs:         4.37 cm  LV e' lateral:   7.56 cm/s LV PW:         1.39 cm  LV E/e' lateral: 17.3 LV IVS:        1.63 cm  LV e' medial:    6.18 cm/s LVOT diam:     2.00 cm  LV E/e' medial:  21.2 LV SV:         47.44 ml LV SV Index:   24.20 LVOT Area:     3.14 cm  RIGHT VENTRICLE RV S prime:     9.69 cm/s LEFT ATRIUM             Index       RIGHT ATRIUM           Index LA diam:        4.70 cm 2.40 cm/m  RA Area:     15.20 cm LA Vol (A2C):   83.6 ml 42.65 ml/m RA Volume:   40.90 ml  20.87 ml/m LA Vol (A4C):   78.7 ml 40.15 ml/m LA Biplane Vol: 84.2 ml 42.96 ml/m  AORTIC VALVE                    PULMONIC VALVE AV Area (Vmax):    1.08 cm     PV Vmax:        1.00 m/s AV Area (Vmean):   1.21 cm     PV Peak grad:   4.0 mmHg AV Area (VTI):     0.92 cm     RVOT Peak grad: 3 mmHg AV Vmax:           244.50 cm/s AV Vmean:          152.000 cm/s AV VTI:            0.518 m AV Peak Grad:      23.9 mmHg AV Mean Grad:      11.5 mmHg LVOT Vmax:         84.40 cm/s LVOT Vmean:        58.600 cm/s LVOT VTI:          0.151 m LVOT/AV VTI ratio: 0.29 AI PHT:            447 msec  AORTA Ao Root diam: 2.70 cm MITRAL VALVE                TRICUSPID VALVE MV Area (PHT): 4.49 cm     TR Peak grad:   29.6 mmHg MV Decel Time: 169 msec     TR Vmax:        272.00 cm/s MV E velocity: 131.00 cm/s                             SHUNTS                             Systemic VTI:  0.15 m  Systemic Diam: 2.00 cm Serafina Royals MD Electronically  signed by Serafina Royals MD Signature Date/Time: 12/27/2019/12:50:40 PM    Final       Echo EF 62-83%, grade 1 diastolic dysfunction   Subjective: Patient walking hall with PT this AM, appearing stead with rolling walker.  Seen later in room, up in chair.  Denies CP or SOB.  Agrees with going home and [facility] PT set up.   Discharge Exam: Vitals:   12/29/19 0326 12/29/19 0808  BP: 126/66 133/64  Pulse: 66 (!) 55  Resp:  16  Temp: 98.7 F (37.1 C) 98.2 F (36.8 C)  SpO2: 96% 94%   Vitals:   12/28/19 1947 12/28/19 1954 12/29/19 0326 12/29/19 0808  BP: (!) 143/60  126/66 133/64  Pulse: 76  66 (!) 55  Resp:    16  Temp: 98 F (36.7 C)  98.7 F (37.1 C) 98.2 F (36.8 C)  TempSrc: Oral  Oral Oral  SpO2: 95% 95% 96% 94%  Weight:   69.1 kg   Height:        General: Pt is alert, awake, not in acute distress Cardiovascular: RRR, S1/S2 +, no rubs, no gallops Respiratory: CTA bilaterally, no wheezing, no rhonchi Abdominal: Soft, NT, ND, bowel sounds + Extremities: no edema, no cyanosis    The results of significant diagnostics from this hospitalization (including imaging, microbiology, ancillary and laboratory) are listed below for reference.     Microbiology: Recent Results (from the past 240 hour(s))  Culture, blood (routine x 2)     Status: None (Preliminary result)   Collection Time: 12/26/19  3:12 AM   Specimen: BLOOD  Result Value Ref Range Status   Specimen Description BLOOD RIGHT WRIST  Final   Special Requests   Final    BOTTLES DRAWN AEROBIC AND ANAEROBIC Blood Culture adequate volume   Culture   Final    NO GROWTH 3 DAYS Performed at [facility], [address], [place], [place] [address]    Report Status PENDING  Incomplete  Culture, blood (routine x 2)     Status: None (Preliminary result)   Collection Time: 12/26/19  3:12 AM   Specimen: BLOOD  Result Value Ref Range Status   Specimen Description BLOOD LEFT WRIST  Final   Special Requests    Final    BOTTLES DRAWN AEROBIC AND ANAEROBIC Blood Culture adequate volume   Culture   Final    NO GROWTH 3 DAYS Performed at [facility], [address], [place], [place] [address]    Report Status PENDING  Incomplete  Respiratory Panel by RT PCR (Flu A&B, Covid) - Nasopharyngeal Swab     Status: Abnormal   Collection Time: 12/26/19  3:12 AM   Specimen: Nasopharyngeal Swab  Result Value Ref Range Status   SARS Coronavirus 2 by RT PCR POSITIVE (A) NEGATIVE Final    Comment: RESULT CALLED TO, READ BACK BY AND VERIFIED WITH: noel webster at 0500 on 12/26/19 rww (NOTE) SARS-CoV-2 target nucleic acids are DETECTED. SARS-CoV-2 RNA is generally detectable in upper respiratory specimens  during the acute phase of infection. Positive results are indicative of the presence of the identified virus, but do not rule out bacterial infection or co-infection with other pathogens not detected by the test. Clinical correlation with patient history and other diagnostic information is necessary to determine patient infection status. The expected result is Negative. Fact Sheet for Patients:  PinkCheek.be Fact Sheet for Healthcare Providers: GravelBags.it This test is  not yet approved or cleared by the [place] and  has been authorized for detection and/or diagnosis of SARS-CoV-2 by FDA under an Emergency Use Authorization (EUA).  This EUA will remain in effect (meaning this test can be used) f or the duration of  the COVID-19 declaration under Section 564(b)(1) of the Act, 21 U.S.C. section 360bbb-3(b)(1), unless the authorization is terminated or revoked sooner.    Influenza A by PCR NEGATIVE NEGATIVE Final   Influenza B by PCR NEGATIVE NEGATIVE Final    Comment: (NOTE) The Xpert Xpress SARS-CoV-2/FLU/RSV assay is intended as an aid in  the diagnosis of influenza from Nasopharyngeal swab specimens and  should not be used  as a sole basis for treatment. Nasal washings and  aspirates are unacceptable for Xpert Xpress SARS-CoV-2/FLU/RSV  testing. Fact Sheet for Patients: PinkCheek.be Fact Sheet for Healthcare Providers: GravelBags.it This test is not yet approved or cleared by the [place] FDA and  has been authorized for detection and/or diagnosis of SARS-CoV-2 by  FDA under an Emergency Use Authorization (EUA). This EUA will remain  in effect (meaning this test can be used) for the duration of the  Covid-19 declaration under Section 564(b)(1) of the Act, 21  U.S.C. section 360bbb-3(b)(1), unless the authorization is  terminated or revoked. Performed at [facility], [address] [place], [place] [address]      Labs: BNP (last 3 results) Recent Labs    10/11/19 0021 12/26/19 0506  BNP 1,635.0* 8,182.9*   Basic Metabolic Panel: Recent Labs  Lab 12/26/19 0312 12/27/19 0416 12/28/19 0548 12/29/19 0426  NA 136 135 135 132*  K 3.9 4.4 4.1 4.2  CL 99 99 97* 95*  CO2 24 25 30 28   GLUCOSE 429* 218* 133* 197*  BUN 22 30* 35* 45*  CREATININE 2.01* 1.85* 1.67* 1.99*  CALCIUM 8.7* 8.6* 8.6* 8.5*  MG  --   --  2.2 2.6*   Liver Function Tests: Recent Labs  Lab 12/26/19 0312 12/28/19 0548  AST 62* 16  ALT 37 24  ALKPHOS 166* 91  BILITOT 0.7 1.3*  PROT 7.5 6.7  ALBUMIN 3.7 3.3*   No results for input(s): LIPASE, AMYLASE in the last 168 hours. No results for input(s): AMMONIA in the last 168 hours. CBC: Recent Labs  Lab 12/26/19 0312 12/28/19 0548  WBC 14.7* 11.5*  NEUTROABS 8.0*  --   HGB 15.3 13.5  HCT 49.0 41.3  MCV 90.9 87.3  PLT 346 281   Cardiac Enzymes: No results for input(s): CKTOTAL, CKMB, CKMBINDEX, TROPONINI in the last 168 hours. BNP: Invalid input(s): POCBNP CBG: Recent Labs  Lab 12/28/19 0743 12/28/19 1145 12/28/19 1708 12/28/19 2137 12/29/19 0809  GLUCAP 137* 188* 190* 200* 201*    D-Dimer No results for input(s): DDIMER in the last 72 hours. Hgb A1c No results for input(s): HGBA1C in the last 72 hours. Lipid Profile No results for input(s): CHOL, HDL, LDLCALC, TRIG, CHOLHDL, LDLDIRECT in the last 72 hours. Thyroid function studies No results for input(s): TSH, T4TOTAL, T3FREE, THYROIDAB in the last 72 hours.  Invalid input(s): FREET3 Anemia work up No results for input(s): VITAMINB12, FOLATE, FERRITIN, TIBC, IRON, RETICCTPCT in the last 72 hours. Urinalysis    Component Value Date/Time   COLORURINE YELLOW (A) 01/15/2016 2115   APPEARANCEUR CLEAR (A) 01/15/2016 2115   APPEARANCEUR Clear 04/28/2014 2346   LABSPEC 1.010 01/15/2016 2115   LABSPEC 1.009 04/28/2014 2346   PHURINE 5.0 01/15/2016 2115   GLUCOSEU  NEGATIVE 01/15/2016 2115   GLUCOSEU Negative 04/28/2014 2346   HGBUR 1+ (A) 01/15/2016 2115   BILIRUBINUR NEGATIVE 01/15/2016 2115   BILIRUBINUR Negative 04/28/2014 2346   [place] 01/15/2016 2115   PROTEINUR 100 (A) 01/15/2016 2115   NITRITE NEGATIVE 01/15/2016 2115   LEUKOCYTESUR TRACE (A) 01/15/2016 2115   LEUKOCYTESUR 1+ 04/28/2014 2346   Sepsis Labs Invalid input(s): PROCALCITONIN,  WBC,  LACTICIDVEN Microbiology Recent Results (from the past 240 hour(s))  Culture, blood (routine x 2)     Status: None (Preliminary result)   Collection Time: 12/26/19  3:12 AM   Specimen: BLOOD  Result Value Ref Range Status   Specimen Description BLOOD RIGHT WRIST  Final   Special Requests   Final    BOTTLES DRAWN AEROBIC AND ANAEROBIC Blood Culture adequate volume   Culture   Final    NO GROWTH 3 DAYS Performed at [facility], [address] [place], [place] [address]    Report Status PENDING  Incomplete  Culture, blood (routine x 2)     Status: None (Preliminary result)   Collection Time: 12/26/19  3:12 AM   Specimen: BLOOD  Result Value Ref Range Status   Specimen Description BLOOD LEFT WRIST  Final   Special Requests    Final    BOTTLES DRAWN AEROBIC AND ANAEROBIC Blood Culture adequate volume   Culture   Final    NO GROWTH 3 DAYS Performed at [facility], [address], [place], [place] [address]    Report Status PENDING  Incomplete  Respiratory Panel by RT PCR (Flu A&B, Covid) - Nasopharyngeal Swab     Status: Abnormal   Collection Time: 12/26/19  3:12 AM   Specimen: Nasopharyngeal Swab  Result Value Ref Range Status   SARS Coronavirus 2 by RT PCR POSITIVE (A) NEGATIVE Final    Comment: RESULT CALLED TO, READ BACK BY AND VERIFIED WITH: noel webster at 0500 on 12/26/19 rww (NOTE) SARS-CoV-2 target nucleic acids are DETECTED. SARS-CoV-2 RNA is generally detectable in upper respiratory specimens  during the acute phase of infection. Positive results are indicative of the presence of the identified virus, but do not rule out bacterial infection or co-infection with other pathogens not detected by the test. Clinical correlation with patient history and other diagnostic information is necessary to determine patient infection status. The expected result is Negative. Fact Sheet for Patients:  PinkCheek.be Fact Sheet for Healthcare Providers: GravelBags.it This test is not yet approved or cleared by the [place] FDA and  has been authorized for detection and/or diagnosis of SARS-CoV-2 by FDA under an Emergency Use Authorization (EUA).  This EUA will remain in effect (meaning this test can be used) f or the duration of  the COVID-19 declaration under Section 564(b)(1) of the Act, 21 U.S.C. section 360bbb-3(b)(1), unless the authorization is terminated or revoked sooner.    Influenza A by PCR NEGATIVE NEGATIVE Final   Influenza B by PCR NEGATIVE NEGATIVE Final    Comment: (NOTE) The Xpert Xpress SARS-CoV-2/FLU/RSV assay is intended as an aid in  the diagnosis of influenza from Nasopharyngeal swab specimens and  should not be used  as a sole basis for treatment. Nasal washings and  aspirates are unacceptable for Xpert Xpress SARS-CoV-2/FLU/RSV  testing. Fact Sheet for Patients: PinkCheek.be Fact Sheet for Healthcare Providers: GravelBags.it This test is not yet approved or cleared by the [place] FDA and  has been authorized for detection and/or diagnosis of SARS-CoV-2 by  FDA under  an Emergency Use Authorization (EUA). This EUA will remain  in effect (meaning this test can be used) for the duration of the  Covid-19 declaration under Section 564(b)(1) of the Act, 21  U.S.C. section 360bbb-3(b)(1), unless the authorization is  terminated or revoked. Performed at [facility], [address] [place],  [address]      Time coordinating discharge: Over 30 minutes  SIGNED:   Ezekiel Slocumb, DO [facility] 12/29/2019, 10:04 AM   If 7PM-7AM, please contact night-coverage [email or web address]

## 2019-12-29 NOTE — Plan of Care (Signed)
  Problem: Clinical Measurements: Goal: Ability to maintain clinical measurements within normal limits will improve Outcome: Progressing Goal: Diagnostic test results will improve Outcome: Progressing   Problem: Elimination: Goal: Will not experience complications related to bowel motility Outcome: Progressing   Problem: Education: Goal: Ability to demonstrate management of disease process will improve Outcome: Progressing   Problem: Elimination: Goal: Will not experience complications related to urinary retention Outcome: Completed/Met

## 2019-12-29 NOTE — Progress Notes (Signed)
Discharge instructions given and all questions answered. 

## 2019-12-29 NOTE — TOC Transition Note (Signed)
Transition of Care [facility]) - CM/SW Discharge Note   Patient Details  Name: [name] MRN: [id number] Date of Birth: 12-07-1945  [facility]) CM/SW Contact:  Victorino Dike, RN Phone Number: 12/29/2019, 10:06 AM   Clinical Narrative:   Patient to discharge home today by Private Vehicle.  Recommendations for Rolling Walker, patient received his rollater within last 5 years and does not feel he needs a RW.  [facility] to continue following patient for [facility] RN, PT, and OT services. Provided patient with Eliquis 30 day coupon. No further TOC needs at this time, please re-consult for new needs.      Final next level of care: Home/Self Care Barriers to Discharge: Barriers Resolved   Patient Goals and CMS Choice   CMS Medicare.gov Compare Post Acute Care list provided to:: (Patient stated he is active with [facility] for Home Health services and would like to continue with them upon discharge.)    Discharge Placement                       Discharge Plan and Services                DME Arranged: N/A DME Agency: NA       HH Arranged: RN, PT, OT HH Agency: [facility] Date HH Agency Contacted: 12/29/19 Time [place]: 1006 Representative spoke with at [place] ([place]) Interventions     Readmission Risk Interventions Readmission Risk Prevention Plan 12/27/2019  Transportation Screening Complete  PCP or Specialist Appt within 3-5 Days Complete  HRI or Home Care Consult Complete  Medication Review Press photographer) Complete  Some recent data might be hidden

## 2019-12-29 NOTE — Progress Notes (Signed)
Physical Therapy Treatment Patient Details Name: [name] MRN: [id number] DOB: November 20, 1945 Today's Date: 12/29/2019    History of Present Illness Pt is 74 y/o M with PMH: COPD, CAD s/p CABG, DM, HTN, CVA, sCHF, CKD, and COVID-19 10/2019. Presented to ER secondary to severe respiratory distress, requiring BiPAP initially; admitted for management of acute respiratory failure related to hypoxia, CHF exacerbation.    PT Comments    Patient alert, up in recliner agreeable to PT. Pt able to perform seated exercises with visual cues. Sit <> stand with RW and supervision, excellent technique noted without verbal cues. The patient ambulated ~118ft with RW and CGA. Pt exhibited step to gait pattern of RLE, decreased step height bilaterally. Pt on room air, 2-3 instances of standing rest breaks and cues for PLB to keep spO2 >90% (dropped to 86-88% with fatigue/talking while walking). Pt with mild complaints of fatigue, otherwise asymptomatic. Pt in recliner, all needs in reach at end of session. The patient would benefit from further skilled PT intervention to maximize safety, mobility, and independence.     Follow Up Recommendations  Home health PT     Equipment Recommendations  Rolling walker with 5" wheels    Recommendations for Other Services       Precautions / Restrictions Precautions Precautions: Fall Precaution Comments: watch HR Restrictions Weight Bearing Restrictions: No    Mobility  Bed Mobility               General bed mobility comments: pt recieved sitting up in chair  Transfers Overall transfer level: Needs assistance Equipment used: Rolling walker (2 wheeled) Transfers: Sit to/from Stand Sit to Stand: Supervision            Ambulation/Gait Ambulation/Gait assistance: Min guard Gait Distance (Feet): 190 Feet Assistive device: Rolling walker (2 wheeled)   Gait velocity: decreased   General Gait Details: Pt exhibited step to gait pattern of RLE,  decreased step height bilaterally. on room air, 2-3 instances of standing rest breaks and cues for PLB to keep spO2 >90%. Pt with mild complaints of fatigue, otherwise asymptomatic.   Stairs             Wheelchair Mobility    Modified Rankin (Stroke Patients Only)       Balance Overall balance assessment: Needs assistance Sitting-balance support: Feet supported Sitting balance-Leahy Scale: Good       Standing balance-Leahy Scale: Fair                              Cognition Arousal/Alertness: Awake/alert Behavior During Therapy: WFL for tasks assessed/performed Overall Cognitive Status: Within Functional Limits for tasks assessed                                        Exercises Other Exercises Other Exercises: Pt performed seated marching, LAQ, and ankle pumps x20 with visual cues    General Comments        Pertinent Vitals/Pain Pain Assessment: No/denies pain    Home Living                      Prior Function            PT Goals (current goals can now be found in the care plan section) Progress towards PT goals: Progressing toward goals    Frequency  Min 2X/week      PT Plan Current plan remains appropriate    Co-evaluation              AM-PAC PT "6 Clicks" Mobility   Outcome Measure  Help needed turning from your back to your side while in a flat bed without using bedrails?: None Help needed moving from lying on your back to sitting on the side of a flat bed without using bedrails?: None Help needed moving to and from a bed to a chair (including a wheelchair)?: A Little Help needed standing up from a chair using your arms (e.g., wheelchair or bedside chair)?: None Help needed to walk in hospital room?: A Little Help needed climbing 3-5 steps with a railing? : A Little 6 Click Score: 21    End of Session Equipment Utilized During Treatment: Gait belt Activity Tolerance: Patient tolerated  treatment well Patient left: in chair;with nursing/sitter in room;with call bell/phone within reach Nurse Communication: Mobility status PT Visit Diagnosis: Muscle weakness (generalized) (M62.81);Difficulty in walking, not elsewhere classified (R26.2)     Time: 4035-2481 PT Time Calculation (min) (ACUTE ONLY): 25 min  Charges:  $Therapeutic Exercise: 23-37 mins                     Lieutenant Diego PT, DPT 10:51 AM,12/29/19

## 2019-12-30 ENCOUNTER — Telehealth: Payer: Self-pay | Admitting: Family

## 2019-12-30 NOTE — Telephone Encounter (Signed)
Called patient to confirm his follow up appt we scheduled with [facility] after his recent hospital d/c and to see how patient was doing but was unable to reach or leave VM.   Alyse Low, [place]

## 2019-12-31 LAB — CULTURE, BLOOD (ROUTINE X 2)
Culture: NO GROWTH
Culture: NO GROWTH
Special Requests: ADEQUATE
Special Requests: ADEQUATE

## 2020-01-02 LAB — BLOOD GAS, ARTERIAL
Acid-base deficit: 3.5 mmol/L — ABNORMAL HIGH (ref 0.0–2.0)
Bicarbonate: 22.7 mmol/L (ref 20.0–28.0)
FIO2: 0.4
O2 Saturation: 99.5 %
Patient temperature: 37
pCO2 arterial: 44 mmHg (ref 32.0–48.0)
pH, Arterial: 7.32 — ABNORMAL LOW (ref 7.350–7.450)
pO2, Arterial: 173 mmHg — ABNORMAL HIGH (ref 83.0–108.0)

## 2020-01-03 DIAGNOSIS — J449 Chronic obstructive pulmonary disease, unspecified: Secondary | ICD-10-CM | POA: Diagnosis not present

## 2020-01-03 DIAGNOSIS — I480 Paroxysmal atrial fibrillation: Secondary | ICD-10-CM | POA: Diagnosis not present

## 2020-01-03 DIAGNOSIS — I69398 Other sequelae of cerebral infarction: Secondary | ICD-10-CM | POA: Diagnosis not present

## 2020-01-03 DIAGNOSIS — I130 Hypertensive heart and chronic kidney disease with heart failure and stage 1 through stage 4 chronic kidney disease, or unspecified chronic kidney disease: Secondary | ICD-10-CM | POA: Diagnosis not present

## 2020-01-03 DIAGNOSIS — E1122 Type 2 diabetes mellitus with diabetic chronic kidney disease: Secondary | ICD-10-CM | POA: Diagnosis not present

## 2020-01-03 DIAGNOSIS — R2681 Unsteadiness on feet: Secondary | ICD-10-CM | POA: Diagnosis not present

## 2020-01-03 DIAGNOSIS — I5023 Acute on chronic systolic (congestive) heart failure: Secondary | ICD-10-CM | POA: Diagnosis not present

## 2020-01-03 DIAGNOSIS — E1165 Type 2 diabetes mellitus with hyperglycemia: Secondary | ICD-10-CM | POA: Diagnosis not present

## 2020-01-03 DIAGNOSIS — J9611 Chronic respiratory failure with hypoxia: Secondary | ICD-10-CM | POA: Diagnosis not present

## 2020-01-03 DIAGNOSIS — N1832 Chronic kidney disease, stage 3b: Secondary | ICD-10-CM | POA: Diagnosis not present

## 2020-01-04 NOTE — Progress Notes (Signed)
Patient ID: [name], male    DOB: [birth year], 74 y.o.   MRN: [id number]  HPI  [name] is a 74 y/o male with a history of CAD, DM, hyperlipidemia, HTN, stroke, COPD, GERD, PVD, atrial fibrillation, current tobacco use and chronic heart failure.   Echo report from 12/26/19 reviewed and showed an EF of 35-40% along with mild Barry and mild mildly elevated PA pressure.   Admitted 12/26/19 due to acute heart failure and HTN. Cardiology consult obtained. Elevated troponin thought to be due to demand ischemia. Discharged after 3 days.   He presents today for a follow-up visit although hasn't been seen since 2017. He presents with a chief complaint of minimal shortness of breath upon minimal exertion. He describes this as chronic in nature having been present for many years although has improved since recent admission. He has associated fatigue, easy bruising and difficulty sleeping along with this. He denies any abdominal distention, palpitations, pedal edema, cheat pain, dizziness or cough.  Just recently got a scale and is going to start weighing himself daily.   Past Medical History:  Diagnosis Date  . Arrhythmia    atrial fibrillation  . Basal cell carcinoma 05/2019   right nasal ala, Tx: EDC  . CHF (congestive heart failure) ([place])   . COPD (chronic obstructive pulmonary disease) ([place])   . Coronary artery disease   . Diabetes mellitus without complication ([place])   . GERD (gastroesophageal reflux disease)   . HLD (hyperlipidemia)   . Hypertension   . Peripheral vascular disease ([place])   . Pneumonia   . Prostate cancer ([place])   . Stroke [facility])    Past Surgical History:  Procedure Laterality Date  . COLONOSCOPY    . CORONARY ARTERY BYPASS GRAFT  2006  . EYE SURGERY    . GOLD SEED IMPLANT N/A 12/30/2016   Procedure: GOLD SEED IMPLANT  x3;  Surgeon: Hollice Espy, MD;  Location: [facility] ORS;  Service: Urology;  Laterality: N/A;  . HEMORRHOID SURGERY N/A 10/02/2016   Procedure: PROCTOSCOPY AND  CONTROL OF RECTAL BLEEDING;  Surgeon: Excell Seltzer, MD;  Location: [facility] ORS;  Service: General;  Laterality: N/A;  . PROSTATE BIOPSY    . TONSILLECTOMY     Family History  Problem Relation Age of Onset  . Lung cancer Mother   . Heart attack Father   . Hypertension Other   . Cancer Maternal Grandfather        prostate  . Cancer Paternal Grandmother        breast   Social History   Tobacco Use  . Smoking status: Current Every Day Smoker    Packs/day: 1.00    Years: 54.00    Pack years: 54.00    Types: Cigarettes  . Smokeless tobacco: Never Used  Substance Use Topics  . Alcohol use: No    Alcohol/week: 0.0 standard drinks   No Known Allergies Prior to Admission medications   Medication Sig Start Date End Date Taking? Authorizing Provider  acetaminophen (TYLENOL) 325 MG tablet Take 2 tablets (650 mg total) by mouth every 6 (six) hours as needed for mild pain (or Fever >/= 101). 01/20/16  Yes Gouru, Illene Silver, MD  albuterol (PROVENTIL HFA;VENTOLIN HFA) 108 (90 Base) MCG/ACT inhaler Inhale 2 puffs into the lungs every 6 (six) hours as needed for wheezing or shortness of breath.   Yes [provider]  amLODipine (NORVASC) 5 MG tablet Take 1 tablet (5 mg total) by mouth daily. 12/30/19  Yes  Nicole Kindred A, DO  apixaban (ELIQUIS) 5 MG TABS tablet Take 1 tablet (5 mg total) by mouth 2 (two) times daily. 12/29/19  Yes Nicole Kindred A, DO  aspirin EC 81 MG tablet Take 81 mg by mouth daily.   Yes [provider]  bifidobacterium infantis (ALIGN) capsule Take 1 capsule by mouth every morning.   Yes [provider]  cholecalciferol (VITAMIN D) 1000 units tablet Take 1,000 Units by mouth daily.   Yes [provider]  Cinnamon 500 MG capsule Take 1,000 mg by mouth 2 (two) times daily.   Yes [provider]  CRANBERRY EXTRACT PO Take 500 mg by mouth every morning.    Yes [provider]  Ensure Max Protein (ENSURE MAX PROTEIN) LIQD Take 330  mLs (11 oz total) by mouth 2 (two) times daily between meals. 12/29/19  Yes Nicole Kindred A, DO  furosemide (LASIX) 20 MG tablet Take 3 tablets (60 mg total) by mouth 2 (two) times daily. 12/29/19  Yes Nicole Kindred A, DO  gabapentin (NEURONTIN) 100 MG capsule Take 100 mg by mouth 2 (two) times daily.   Yes [provider]  glucose 4 GM chewable tablet Chew 1 tablet by mouth as needed for low blood sugar.   Yes [provider]  hydrALAZINE (APRESOLINE) 50 MG tablet Take 1 tablet (50 mg total) by mouth every 8 (eight) hours. 10/15/19  Yes Ghimire, Henreitta Leber, MD  insulin aspart (NOVOLOG) 100 UNIT/ML injection 0-9 Units, Subcutaneous, 3 times daily with meals CBG < 70: Implement Hypoglycemia protocol/measures CBG 70 - 120: 0 units CBG 121 - 150: 1 unit CBG 151 - 200: 2 units CBG 201 - 250: 3 units CBG 251 - 300: 5 units CBG 301 - 350: 7 units CBG 351 - 400: 9 units CBG > 400: call MD 10/15/19  Yes Ghimire, Henreitta Leber, MD  Ipratropium-Albuterol (COMBIVENT) 20-100 MCG/ACT AERS respimat Inhale 2 puffs into the lungs 4 (four) times daily as needed. 09/08/19 09/07/20 Yes [provider]  isosorbide mononitrate (IMDUR) 30 MG 24 hr tablet Take 1 tablet (30 mg total) by mouth daily. 10/15/19  Yes Ghimire, Henreitta Leber, MD  LANTUS SOLOSTAR 100 UNIT/ML Solostar Pen Inject 45 Units into the skin daily at 10 pm. 10/15/19  Yes Ghimire, Henreitta Leber, MD  loperamide (IMODIUM) 2 MG capsule Take 4 mg by mouth as needed for diarrhea or loose stools.   Yes [provider]  Magnesium 250 MG TABS Take 250 mg by mouth daily.   Yes [provider]  metoprolol tartrate (LOPRESSOR) 25 MG tablet Take 0.5 tablets (12.5 mg total) by mouth 2 (two) times daily. 12/29/19  Yes Ezekiel Slocumb, DO  Misc Natural Products (TOTAL MEMORY & FOCUS FORMULA PO) Take 1 capsule by mouth See admin instructions. Take 2 capsule at 9:00 AM, 1 capsule at 5:00 PM, and 1 capsule at bedtime.   Yes [provider]  Multiple Vitamin (MULTIVITAMIN WITH MINERALS) TABS tablet Take 1 tablet by mouth daily.   Yes [provider]  multivitamin-lutein (OCUVITE-LUTEIN) CAPS capsule Take 1 capsule by mouth daily.   Yes [provider]  omeprazole (PRILOSEC) 20 MG capsule Take 20 mg by mouth daily.    Yes [provider]  potassium chloride (K-DUR) 10 MEQ tablet Take 10 mEq by mouth daily.   Yes [provider]  simvastatin (ZOCOR) 20 MG tablet Take 20 mg by mouth at bedtime.   Yes [provider]  SUPER  B COMPLEX/C PO Take 1 tablet by mouth every morning.   Yes [provider]  vitamin C (ASCORBIC ACID) 500 MG tablet Take 500 mg by mouth 2 (two) times daily.    Yes [provider]     Review of Systems  Constitutional: Positive for fatigue. Negative for appetite change.  HENT: Positive for congestion. Negative for postnasal drip and sore throat.   Eyes: Negative.   Respiratory: Positive for shortness of breath. Negative for cough and chest tightness.   Cardiovascular: Negative for chest pain, palpitations and leg swelling.  Gastrointestinal: Negative for abdominal distention and abdominal pain.  Endocrine: Negative.   Genitourinary: Negative.   Musculoskeletal: Negative for back pain and neck pain.  Skin: Negative.   Allergic/Immunologic: Negative.   Neurological: Negative.  Negative for dizziness and light-headedness.  Hematological: Negative for adenopathy. Bruises/bleeds easily.  Psychiatric/Behavioral: Positive for sleep disturbance. Negative for dysphoric mood. The patient is not nervous/anxious.     Vitals:   01/05/20 1249  BP: 124/65  Pulse: 75  Resp: 18  SpO2: 93%  Weight: 169 lb 3.2 oz (76.7 kg)  Height: 5\' 6"  (1.676 m)   Wt Readings from Last 3 Encounters:  01/05/20 169 lb 3.2 oz (76.7 kg)  12/29/19 152 lb 6.4 oz (69.1 kg)  10/15/19 159 lb 2.8 oz (72.2 kg)   Lab Results  Component Value Date   CREATININE  1.99 (H) 12/29/2019   CREATININE 1.67 (H) 12/28/2019   CREATININE 1.85 (H) 12/27/2019    Physical Exam Vitals and nursing note reviewed.  Constitutional:      Appearance: Normal appearance.  HENT:     Head: Normocephalic and atraumatic.  Cardiovascular:     Rate and Rhythm: Normal rate. Rhythm irregular.  Pulmonary:     Effort: Pulmonary effort is normal.     Breath sounds: Rales (RLL) present. No wheezing.  Abdominal:     General: Abdomen is flat. There is no distension.     Palpations: Abdomen is soft.  Musculoskeletal:        General: No tenderness.     Cervical back: Normal range of motion and neck supple.     Right lower leg: No edema.     Left lower leg: No edema.  Skin:    General: Skin is warm and dry.  Neurological:     General: No focal deficit present.     Mental Status: He is alert and oriented to person, place, and time.  Psychiatric:        Mood and Affect: Mood normal.        Behavior: Behavior normal.        Thought Content: Thought content normal.     Assessment & Plan:  1: Chronic heart failure with reduced ejection fraction- - NYHA class II - euvolemic today - just bought some scales; instructed to weigh every morning, write the weight down and call for an overnight weight gain of >2 pounds or a weekly weight gain of >5 pounds - does add "some" salt but says that he's using less; admits that he "really likes salt" but understand the importance of following a low sodium diet (did eat hot dogs w/ chili and mustard last night) - renal function may not allow for entresto - consider changing metoprolol to succinate version - saw cardiology (Fath) 08/17/2019 & returns later today - BNP 12/26/19 was 1145.0 - did receive his flu vaccine for this season  2: HTN- - BP looks good today - saw  PCP Baldemar Lenis) 11/25/19 - BMP 12/29/19 reviewed and showed sodium 132, potassium 4.2, creatinine 1.99 and GFR 32  3: DM- - A1c 12/26/19 was 7.6% - fasting glucose at  home this morning was 315 but he says that last night he ate hot dogs with mustard and chili on them  4: Atrial fibrillation- - newly diagnosed - currently on apixaban  5: Tobacco use- - smokes ~ 1/2 ppd of cigarettes - doesn't have a huge desire to quit smoking - complete cessation discussed for 3 minutes with him  Medication bottles were reviewed.   Return in 2 months or sooner for any questions/problems before then.

## 2020-01-05 ENCOUNTER — Encounter: Payer: Self-pay | Admitting: Family

## 2020-01-05 ENCOUNTER — Ambulatory Visit: Payer: PPO | Attending: Family | Admitting: Family

## 2020-01-05 ENCOUNTER — Other Ambulatory Visit: Payer: Self-pay

## 2020-01-05 VITALS — BP 124/65 | HR 75 | Resp 18 | Ht 66.0 in | Wt 169.2 lb

## 2020-01-05 DIAGNOSIS — Z8249 Family history of ischemic heart disease and other diseases of the circulatory system: Secondary | ICD-10-CM | POA: Insufficient documentation

## 2020-01-05 DIAGNOSIS — Z951 Presence of aortocoronary bypass graft: Secondary | ICD-10-CM | POA: Diagnosis not present

## 2020-01-05 DIAGNOSIS — I161 Hypertensive emergency: Secondary | ICD-10-CM | POA: Diagnosis not present

## 2020-01-05 DIAGNOSIS — I4891 Unspecified atrial fibrillation: Secondary | ICD-10-CM | POA: Diagnosis not present

## 2020-01-05 DIAGNOSIS — I2510 Atherosclerotic heart disease of native coronary artery without angina pectoris: Secondary | ICD-10-CM | POA: Diagnosis not present

## 2020-01-05 DIAGNOSIS — E1122 Type 2 diabetes mellitus with diabetic chronic kidney disease: Secondary | ICD-10-CM

## 2020-01-05 DIAGNOSIS — Z7982 Long term (current) use of aspirin: Secondary | ICD-10-CM | POA: Insufficient documentation

## 2020-01-05 DIAGNOSIS — E785 Hyperlipidemia, unspecified: Secondary | ICD-10-CM | POA: Insufficient documentation

## 2020-01-05 DIAGNOSIS — Z85828 Personal history of other malignant neoplasm of skin: Secondary | ICD-10-CM | POA: Diagnosis not present

## 2020-01-05 DIAGNOSIS — I480 Paroxysmal atrial fibrillation: Secondary | ICD-10-CM

## 2020-01-05 DIAGNOSIS — Z794 Long term (current) use of insulin: Secondary | ICD-10-CM | POA: Insufficient documentation

## 2020-01-05 DIAGNOSIS — I5021 Acute systolic (congestive) heart failure: Secondary | ICD-10-CM | POA: Diagnosis not present

## 2020-01-05 DIAGNOSIS — E1151 Type 2 diabetes mellitus with diabetic peripheral angiopathy without gangrene: Secondary | ICD-10-CM | POA: Diagnosis not present

## 2020-01-05 DIAGNOSIS — Z7901 Long term (current) use of anticoagulants: Secondary | ICD-10-CM | POA: Diagnosis not present

## 2020-01-05 DIAGNOSIS — Z8546 Personal history of malignant neoplasm of prostate: Secondary | ICD-10-CM | POA: Diagnosis not present

## 2020-01-05 DIAGNOSIS — F17210 Nicotine dependence, cigarettes, uncomplicated: Secondary | ICD-10-CM | POA: Insufficient documentation

## 2020-01-05 DIAGNOSIS — J449 Chronic obstructive pulmonary disease, unspecified: Secondary | ICD-10-CM | POA: Insufficient documentation

## 2020-01-05 DIAGNOSIS — K219 Gastro-esophageal reflux disease without esophagitis: Secondary | ICD-10-CM | POA: Diagnosis not present

## 2020-01-05 DIAGNOSIS — Z79899 Other long term (current) drug therapy: Secondary | ICD-10-CM | POA: Insufficient documentation

## 2020-01-05 DIAGNOSIS — N1832 Chronic kidney disease, stage 3b: Secondary | ICD-10-CM | POA: Diagnosis not present

## 2020-01-05 DIAGNOSIS — I110 Hypertensive heart disease with heart failure: Secondary | ICD-10-CM | POA: Insufficient documentation

## 2020-01-05 DIAGNOSIS — I5022 Chronic systolic (congestive) heart failure: Secondary | ICD-10-CM | POA: Diagnosis not present

## 2020-01-05 DIAGNOSIS — Z8673 Personal history of transient ischemic attack (TIA), and cerebral infarction without residual deficits: Secondary | ICD-10-CM | POA: Insufficient documentation

## 2020-01-05 DIAGNOSIS — E119 Type 2 diabetes mellitus without complications: Secondary | ICD-10-CM | POA: Diagnosis not present

## 2020-01-05 DIAGNOSIS — I10 Essential (primary) hypertension: Secondary | ICD-10-CM

## 2020-01-05 DIAGNOSIS — I639 Cerebral infarction, unspecified: Secondary | ICD-10-CM | POA: Diagnosis not present

## 2020-01-05 DIAGNOSIS — Z720 Tobacco use: Secondary | ICD-10-CM | POA: Diagnosis not present

## 2020-01-05 DIAGNOSIS — I4819 Other persistent atrial fibrillation: Secondary | ICD-10-CM | POA: Diagnosis not present

## 2020-01-05 DIAGNOSIS — I350 Nonrheumatic aortic (valve) stenosis: Secondary | ICD-10-CM | POA: Diagnosis not present

## 2020-01-05 DIAGNOSIS — E1169 Type 2 diabetes mellitus with other specified complication: Secondary | ICD-10-CM | POA: Diagnosis not present

## 2020-01-05 NOTE — Patient Instructions (Signed)
Continue weighing daily and call for an overnight weight gain of > 2 pounds or a weekly weight gain of >5 pounds. 

## 2020-01-06 DIAGNOSIS — N179 Acute kidney failure, unspecified: Secondary | ICD-10-CM | POA: Diagnosis not present

## 2020-01-06 DIAGNOSIS — Z79899 Other long term (current) drug therapy: Secondary | ICD-10-CM | POA: Diagnosis not present

## 2020-01-06 DIAGNOSIS — N183 Chronic kidney disease, stage 3 unspecified: Secondary | ICD-10-CM | POA: Diagnosis not present

## 2020-01-06 DIAGNOSIS — E1142 Type 2 diabetes mellitus with diabetic polyneuropathy: Secondary | ICD-10-CM | POA: Diagnosis not present

## 2020-01-06 DIAGNOSIS — Z794 Long term (current) use of insulin: Secondary | ICD-10-CM | POA: Diagnosis not present

## 2020-01-06 DIAGNOSIS — E785 Hyperlipidemia, unspecified: Secondary | ICD-10-CM | POA: Diagnosis not present

## 2020-01-06 DIAGNOSIS — E1169 Type 2 diabetes mellitus with other specified complication: Secondary | ICD-10-CM | POA: Diagnosis not present

## 2020-01-06 DIAGNOSIS — I5023 Acute on chronic systolic (congestive) heart failure: Secondary | ICD-10-CM | POA: Diagnosis not present

## 2020-01-06 DIAGNOSIS — I480 Paroxysmal atrial fibrillation: Secondary | ICD-10-CM | POA: Diagnosis not present

## 2020-01-20 DIAGNOSIS — I480 Paroxysmal atrial fibrillation: Secondary | ICD-10-CM | POA: Diagnosis not present

## 2020-01-20 DIAGNOSIS — J9611 Chronic respiratory failure with hypoxia: Secondary | ICD-10-CM | POA: Diagnosis not present

## 2020-01-20 DIAGNOSIS — I69398 Other sequelae of cerebral infarction: Secondary | ICD-10-CM | POA: Diagnosis not present

## 2020-01-20 DIAGNOSIS — N1832 Chronic kidney disease, stage 3b: Secondary | ICD-10-CM | POA: Diagnosis not present

## 2020-01-20 DIAGNOSIS — E1122 Type 2 diabetes mellitus with diabetic chronic kidney disease: Secondary | ICD-10-CM | POA: Diagnosis not present

## 2020-01-20 DIAGNOSIS — I5023 Acute on chronic systolic (congestive) heart failure: Secondary | ICD-10-CM | POA: Diagnosis not present

## 2020-01-20 DIAGNOSIS — I130 Hypertensive heart and chronic kidney disease with heart failure and stage 1 through stage 4 chronic kidney disease, or unspecified chronic kidney disease: Secondary | ICD-10-CM | POA: Diagnosis not present

## 2020-02-01 DIAGNOSIS — E119 Type 2 diabetes mellitus without complications: Secondary | ICD-10-CM | POA: Diagnosis not present

## 2020-02-01 DIAGNOSIS — E1159 Type 2 diabetes mellitus with other circulatory complications: Secondary | ICD-10-CM | POA: Diagnosis not present

## 2020-02-01 DIAGNOSIS — I350 Nonrheumatic aortic (valve) stenosis: Secondary | ICD-10-CM | POA: Diagnosis not present

## 2020-02-01 DIAGNOSIS — J438 Other emphysema: Secondary | ICD-10-CM | POA: Diagnosis not present

## 2020-02-01 DIAGNOSIS — E1169 Type 2 diabetes mellitus with other specified complication: Secondary | ICD-10-CM | POA: Diagnosis not present

## 2020-02-01 DIAGNOSIS — I10 Essential (primary) hypertension: Secondary | ICD-10-CM | POA: Diagnosis not present

## 2020-02-01 DIAGNOSIS — I25810 Atherosclerosis of coronary artery bypass graft(s) without angina pectoris: Secondary | ICD-10-CM | POA: Diagnosis not present

## 2020-02-01 DIAGNOSIS — I5022 Chronic systolic (congestive) heart failure: Secondary | ICD-10-CM | POA: Diagnosis not present

## 2020-02-01 DIAGNOSIS — I5021 Acute systolic (congestive) heart failure: Secondary | ICD-10-CM | POA: Diagnosis not present

## 2020-02-01 DIAGNOSIS — N1832 Chronic kidney disease, stage 3b: Secondary | ICD-10-CM | POA: Diagnosis not present

## 2020-02-01 DIAGNOSIS — I639 Cerebral infarction, unspecified: Secondary | ICD-10-CM | POA: Diagnosis not present

## 2020-02-01 DIAGNOSIS — I161 Hypertensive emergency: Secondary | ICD-10-CM | POA: Diagnosis not present

## 2020-02-06 DIAGNOSIS — J449 Chronic obstructive pulmonary disease, unspecified: Secondary | ICD-10-CM | POA: Diagnosis not present

## 2020-02-06 DIAGNOSIS — I69398 Other sequelae of cerebral infarction: Secondary | ICD-10-CM | POA: Diagnosis not present

## 2020-02-06 DIAGNOSIS — E1122 Type 2 diabetes mellitus with diabetic chronic kidney disease: Secondary | ICD-10-CM | POA: Diagnosis not present

## 2020-02-06 DIAGNOSIS — I5023 Acute on chronic systolic (congestive) heart failure: Secondary | ICD-10-CM | POA: Diagnosis not present

## 2020-02-06 DIAGNOSIS — N1832 Chronic kidney disease, stage 3b: Secondary | ICD-10-CM | POA: Diagnosis not present

## 2020-02-06 DIAGNOSIS — E1165 Type 2 diabetes mellitus with hyperglycemia: Secondary | ICD-10-CM | POA: Diagnosis not present

## 2020-02-06 DIAGNOSIS — I480 Paroxysmal atrial fibrillation: Secondary | ICD-10-CM | POA: Diagnosis not present

## 2020-02-06 DIAGNOSIS — R2681 Unsteadiness on feet: Secondary | ICD-10-CM | POA: Diagnosis not present

## 2020-02-06 DIAGNOSIS — J9611 Chronic respiratory failure with hypoxia: Secondary | ICD-10-CM | POA: Diagnosis not present

## 2020-02-06 DIAGNOSIS — I130 Hypertensive heart and chronic kidney disease with heart failure and stage 1 through stage 4 chronic kidney disease, or unspecified chronic kidney disease: Secondary | ICD-10-CM | POA: Diagnosis not present

## 2020-03-05 ENCOUNTER — Other Ambulatory Visit: Payer: Self-pay

## 2020-03-05 ENCOUNTER — Inpatient Hospital Stay: Payer: PPO | Attending: Radiation Oncology

## 2020-03-05 DIAGNOSIS — C61 Malignant neoplasm of prostate: Secondary | ICD-10-CM | POA: Diagnosis not present

## 2020-03-05 LAB — PSA: Prostatic Specific Antigen: 0.22 ng/mL (ref 0.00–4.00)

## 2020-03-08 ENCOUNTER — Encounter: Payer: Self-pay | Admitting: Radiation Oncology

## 2020-03-12 ENCOUNTER — Other Ambulatory Visit: Payer: Self-pay

## 2020-03-12 ENCOUNTER — Ambulatory Visit: Payer: PPO | Admitting: Family

## 2020-03-12 ENCOUNTER — Ambulatory Visit
Admission: RE | Admit: 2020-03-12 | Discharge: 2020-03-12 | Disposition: A | Discharge status: Home / Self Care | Payer: PPO | Source: Ambulatory Visit | Attending: Radiation Oncology | Admitting: Radiation Oncology

## 2020-03-12 ENCOUNTER — Encounter: Payer: Self-pay | Admitting: Radiation Oncology

## 2020-03-12 ENCOUNTER — Encounter: Payer: Self-pay | Admitting: Family

## 2020-03-12 VITALS — BP 103/65 | HR 61 | Temp 97.5°F | Resp 16 | Wt 173.2 lb

## 2020-03-12 VITALS — BP 119/64 | HR 65 | Resp 16 | Ht 66.0 in | Wt 175.0 lb

## 2020-03-12 DIAGNOSIS — Z923 Personal history of irradiation: Secondary | ICD-10-CM | POA: Insufficient documentation

## 2020-03-12 DIAGNOSIS — C61 Malignant neoplasm of prostate: Secondary | ICD-10-CM | POA: Diagnosis not present

## 2020-03-12 DIAGNOSIS — I10 Essential (primary) hypertension: Secondary | ICD-10-CM

## 2020-03-12 DIAGNOSIS — I480 Paroxysmal atrial fibrillation: Secondary | ICD-10-CM

## 2020-03-12 DIAGNOSIS — E1122 Type 2 diabetes mellitus with diabetic chronic kidney disease: Secondary | ICD-10-CM

## 2020-03-12 DIAGNOSIS — I5022 Chronic systolic (congestive) heart failure: Secondary | ICD-10-CM

## 2020-03-12 DIAGNOSIS — Z720 Tobacco use: Secondary | ICD-10-CM

## 2020-03-12 MED ORDER — APIXABAN 5 MG PO TABS: 5.0000 mg | ORAL_TABLET | Freq: Two times a day (BID) | ORAL | 3 refills | Status: DC

## 2020-03-12 NOTE — Progress Notes (Signed)
[name] ID: [name], male    DOB: 16-Feb-1946, 74 y.o.   MRN: [id number]  HPI  [name] is a 74 y/o male with a history of CAD, DM, hyperlipidemia, HTN, stroke, COPD, GERD, PVD, atrial fibrillation, current tobacco use and chronic heart failure.   Echo report from 12/26/19 reviewed and showed an EF of 35-40% along with mild Barry and mild mildly elevated PA pressure.   Admitted 12/26/19 due to acute heart failure and HTN. Cardiology consult obtained. Elevated troponin thought to be due to demand ischemia. Discharged after 3 days.   [name] presents today for a follow-up visit with a chief complaint of moderate shortness of breath with little exertion. [name] describes this as chronic in nature having been present for several years. [name] says that [name] can walk " a little bit" and then then gets short of breath but quickly recovers. [name] has associated fatigue, easy bruising and chronic difficulty sleeping along with this.   [name] says that [name]'s been out of his eliquis for ~ 1 month and says that [name] doesn't know why it's not getting filled.   Past Medical History:  Diagnosis Date  . Arrhythmia    atrial fibrillation  . Basal cell carcinoma 05/2019   right nasal ala, Tx: EDC  . CHF (congestive heart failure) ([place])   . COPD (chronic obstructive pulmonary disease) ([place])   . Coronary artery disease   . Diabetes mellitus without complication ([place])   . GERD (gastroesophageal reflux disease)   . HLD (hyperlipidemia)   . Hypertension   . Peripheral vascular disease ([place])   . Pneumonia   . Prostate cancer ([place])   . Stroke [facility])    Past Surgical History:  Procedure Laterality Date  . COLONOSCOPY    . CORONARY ARTERY BYPASS GRAFT  2006  . EYE SURGERY    . GOLD SEED IMPLANT N/A 12/30/2016   Procedure: GOLD SEED IMPLANT  x3;  Surgeon: Hollice Espy, MD;  Location: [facility] ORS;  Service: Urology;  Laterality: N/A;  . HEMORRHOID SURGERY N/A 10/02/2016   Procedure: PROCTOSCOPY AND CONTROL OF RECTAL BLEEDING;   Surgeon: Excell Seltzer, MD;  Location: [facility] ORS;  Service: General;  Laterality: N/A;  . PROSTATE BIOPSY    . TONSILLECTOMY     Family History  Problem Relation Age of Onset  . Lung cancer Mother   . Heart attack Father   . Hypertension Other   . Cancer Maternal Grandfather        prostate  . Cancer Paternal Grandmother        breast   Social History   Tobacco Use  . Smoking status: Current Every Day Smoker    Packs/day: 1.00    Years: 54.00    Pack years: 54.00    Types: Cigarettes  . Smokeless tobacco: Never Used  Substance Use Topics  . Alcohol use: No    Alcohol/week: 0.0 standard drinks   No Known Allergies  Past Medical History:  Diagnosis Date  . Arrhythmia    atrial fibrillation  . Basal cell carcinoma 05/2019   right nasal ala, Tx: EDC  . CHF (congestive heart failure) ([place])   . COPD (chronic obstructive pulmonary disease) ([place])   . Coronary artery disease   . Diabetes mellitus without complication ([place])   . GERD (gastroesophageal reflux disease)   . HLD (hyperlipidemia)   . Hypertension   . Peripheral vascular disease ([place])   . Pneumonia   . Prostate cancer ([place])   .  Stroke [facility]    Past Surgical History:  Procedure Laterality Date  . COLONOSCOPY    . CORONARY ARTERY BYPASS GRAFT  2006  . EYE SURGERY    . GOLD SEED IMPLANT N/A 12/30/2016   Procedure: GOLD SEED IMPLANT  x3;  Surgeon: Hollice Espy, MD;  Location: [facility] ORS;  Service: Urology;  Laterality: N/A;  . HEMORRHOID SURGERY N/A 10/02/2016   Procedure: PROCTOSCOPY AND CONTROL OF RECTAL BLEEDING;  Surgeon: Excell Seltzer, MD;  Location: [facility];  Service: General;  Laterality: N/A;  . PROSTATE BIOPSY    . TONSILLECTOMY     Family History  Problem Relation Age of Onset  . Lung cancer Mother   . Heart attack Father   . Hypertension Other   . Cancer Maternal Grandfather        prostate  . Cancer Paternal Grandmother        breast   Social History   Tobacco Use  . Smoking status:  Current Every Day Smoker    Packs/day: 1.00    Years: 54.00    Pack years: 54.00    Types: Cigarettes  . Smokeless tobacco: Never Used  Substance Use Topics  . Alcohol use: No    Alcohol/week: 0.0 standard drinks   No Known Allergies Prior to Admission medications   Medication Sig Start Date End Date Taking? Authorizing Provider  acetaminophen (TYLENOL) 325 MG tablet Take 2 tablets (650 mg total) by mouth every 6 (six) hours as needed for mild pain (or Fever >/= 101). 01/20/16  Yes Gouru, Illene Silver, MD  albuterol (PROVENTIL HFA;VENTOLIN HFA) 108 (90 Base) MCG/ACT inhaler Inhale 2 puffs into the lungs every 6 (six) hours as needed for wheezing or shortness of breath.   Yes [provider]  amLODipine (NORVASC) 5 MG tablet Take 1 tablet (5 mg total) by mouth daily. 12/30/19  Yes Nicole Kindred A, DO  aspirin EC 81 MG tablet Take 81 mg by mouth daily.   Yes [provider]  bifidobacterium infantis (ALIGN) capsule Take 1 capsule by mouth every morning.   Yes [provider]  cholecalciferol (VITAMIN D) 1000 units tablet Take 1,000 Units by mouth daily.   Yes [provider]  Cinnamon 500 MG capsule Take 1,000 mg by mouth 2 (two) times daily.   Yes [provider]  CRANBERRY EXTRACT PO Take 500 mg by mouth every morning.    Yes [provider]  Ensure Max Protein (ENSURE MAX PROTEIN) LIQD Take 330 mLs (11 oz total) by mouth 2 (two) times daily between meals. 12/29/19  Yes Nicole Kindred A, DO  furosemide (LASIX) 20 MG tablet Take 3 tablets (60 mg total) by mouth 2 (two) times daily. 12/29/19  Yes Nicole Kindred A, DO  gabapentin (NEURONTIN) 100 MG capsule Take 100 mg by mouth 2 (two) times daily.   Yes [provider]  glucose 4 GM chewable tablet Chew 1 tablet by mouth as needed for low blood sugar.   Yes [provider]  hydrALAZINE (APRESOLINE) 50 MG tablet Take 1 tablet (50 mg total) by mouth every 8 (eight) hours. 10/15/19   Yes Ghimire, Henreitta Leber, MD  insulin aspart (NOVOLOG) 100 UNIT/ML injection 0-9 Units, Subcutaneous, 3 times daily with meals CBG < 70: Implement Hypoglycemia protocol/measures CBG 70 - 120: 0 units CBG 121 - 150: 1 unit CBG 151 - 200: 2 units CBG 201 - 250: 3 units CBG 251 - 300: 5 units CBG 301 - 350: 7 units CBG 351 -  400: 9 units CBG > 400: call MD 10/15/19  Yes Ghimire, Henreitta Leber, MD  Ipratropium-Albuterol (COMBIVENT) 20-100 MCG/ACT AERS respimat Inhale 2 puffs into the lungs 4 (four) times daily as needed. 09/08/19 09/07/20 Yes [provider]  isosorbide mononitrate (IMDUR) 30 MG 24 hr tablet Take 1 tablet (30 mg total) by mouth daily. 10/15/19  Yes Ghimire, Henreitta Leber, MD  LANTUS SOLOSTAR 100 UNIT/ML Solostar Pen Inject 45 Units into the skin daily at 10 pm. [name] taking differently: Inject 47 Units into the skin daily at 10 pm.  10/15/19  Yes Ghimire, Henreitta Leber, MD  Magnesium 250 MG TABS Take 250 mg by mouth daily.   Yes [provider]  metoprolol tartrate (LOPRESSOR) 25 MG tablet Take 0.5 tablets (12.5 mg total) by mouth 2 (two) times daily. 12/29/19  Yes Ezekiel Slocumb, DO  Misc Natural Products (TOTAL MEMORY & FOCUS FORMULA PO) Take 1 capsule by mouth See admin instructions. Take 2 capsule at 9:00 AM, 1 capsule at 5:00 PM, and 1 capsule at bedtime.   Yes [provider]  Multiple Vitamin (MULTIVITAMIN WITH MINERALS) TABS tablet Take 1 tablet by mouth daily.   Yes [provider]  multivitamin-lutein (OCUVITE-LUTEIN) CAPS capsule Take 1 capsule by mouth daily.   Yes [provider]  omeprazole (PRILOSEC) 20 MG capsule Take 20 mg by mouth daily.    Yes [provider]  potassium chloride (K-DUR) 10 MEQ tablet Take 10 mEq by mouth daily.   Yes [provider]  simvastatin (ZOCOR) 20 MG tablet Take 20 mg by mouth at bedtime.   Yes [provider]  SUPER B COMPLEX/C PO Take 1 tablet by mouth every morning.   Yes  [provider]  vitamin C (ASCORBIC ACID) 500 MG tablet Take 500 mg by mouth 2 (two) times daily.    Yes [provider]  apixaban (ELIQUIS) 5 MG TABS tablet Take 1 tablet (5 mg total) by mouth 2 (two) times daily. [name] not taking: Reported on 03/12/2020 12/29/19   Ezekiel Slocumb, DO     Review of Systems  Constitutional: Positive for fatigue. Negative for appetite change.  HENT: Positive for congestion. Negative for postnasal drip and sore throat.   Eyes: Negative.   Respiratory: Positive for shortness of breath (easily). Negative for cough and chest tightness.   Cardiovascular: Negative for chest pain, palpitations and leg swelling.  Gastrointestinal: Negative for abdominal distention and abdominal pain.  Endocrine: Negative.   Genitourinary: Negative.   Musculoskeletal: Negative for back pain and neck pain.  Skin: Negative.   Allergic/Immunologic: Negative.   Neurological: Negative.  Negative for dizziness and light-headedness.  Hematological: Negative for adenopathy. Bruises/bleeds easily.  Psychiatric/Behavioral: Positive for sleep disturbance. Negative for dysphoric mood. The [name] is not nervous/anxious.    Vitals:   03/12/20 1027  BP: 119/64  Pulse: 65  Resp: 16  SpO2: 98%  Weight: 175 lb (79.4 kg)  Height: 5\' 6"  (1.676 m)   Wt Readings from Last 3 Encounters:  03/12/20 175 lb (79.4 kg)  03/08/20 173 lb 3.2 oz (78.6 kg)  01/05/20 169 lb 3.2 oz (76.7 kg)   Lab Results  Component Value Date   CREATININE 1.99 (H) 12/29/2019   CREATININE 1.67 (H) 12/28/2019   CREATININE 1.85 (H) 12/27/2019    Physical Exam Vitals and nursing note reviewed.  Constitutional:      Appearance: Normal appearance.  HENT:     Head: Normocephalic and atraumatic.  Cardiovascular:  Rate and Rhythm: Normal rate. Rhythm irregular.  Pulmonary:     Effort: Pulmonary effort is normal.     Breath sounds: No wheezing or rales.  Abdominal:     General: Abdomen is  flat. There is no distension.     Palpations: Abdomen is soft.  Musculoskeletal:        General: No tenderness.     Cervical back: Normal range of motion and neck supple.     Right lower leg: No edema.     Left lower leg: No edema.  Skin:    General: Skin is warm and dry.  Neurological:     General: No focal deficit present.     Mental Status: [name] is alert and oriented to person, place, and time.  Psychiatric:        Mood and Affect: Mood normal.        Behavior: Behavior normal.        Thought Content: Thought content normal.     Assessment & Plan:  1: Chronic heart failure with reduced ejection fraction- - NYHA class III - euvolemic today - weighing daily; reminded to call for an overnight weight gain of >2 pounds or a weekly weight gain of >5 pounds - weight up 6 pounds from last visit here 2 months ago - does add "some" salt but says that [name]'s using less; admits that [name] "really likes salt" but understand the importance of following a low sodium diet  - renal function may not allow for entresto; could consider stopping hydralazine or amlodipine to get BP higher - saw cardiology (Fath) 02/01/20 - BNP 12/26/19 was 1145.0 - PharmD reconciled medications with the [name]  2: HTN- - BP looks good today - saw PCP Baldemar Lenis) 01/06/20 - BMP 01/06/20 reviewed and showed sodium 134, potassium 4.2, creatinine 2.0 and GFR 33  3: DM- - A1c 12/26/19 was 7.6% - fasting glucose at home this morning was 179  4: Atrial fibrillation- - newly diagnosed - new prescription sent in for eliquis as pharmacist called [name]'s pharmacy and was told [name] didn't have any refills on it - advised [name] that [name] needs to follow-up with provider if [name] doesn't get something refilled in a timely manner in case there's a glitch in communication  5: Tobacco use- - smokes ~ 1 ppd of cigarettes - doesn't have a huge desire to quit smoking - complete cessation discussed for 3 minutes with him   [name] did not  bring his medications nor a list. Each medication was verbally reviewed with the [name] and [name] was encouraged to bring the bottles to every visit to confirm accuracy of list.  Return in 6 months or sooner for any questions/problems before then.

## 2020-03-12 NOTE — Patient Instructions (Signed)
Continue weighing daily and call for an overnight weight gain of > 2 pounds or a weekly weight gain of >5 pounds. 

## 2020-03-12 NOTE — Progress Notes (Signed)
[facility] - PHARMACIST COUNSELING NOTE  ADHERENCE ASSESSMENT  Adherence strategy: Patient's wife helps patient with medications. Utilizes a pill bottle. Patient has not been taking apixaban as he is out of this medication and there was difficulty obtaining prescription from pharmacy.    Do you ever forget to take your medication? [] Yes (1) [x] No (0)  Do you ever skip doses due to side effects? [] Yes (1) [x] No (0)  Do you have trouble affording your medicines? [] Yes (1) [x] No (0)  Are you ever unable to pick up your medication due to transportation difficulties? [] Yes (1) [x] No (0)  Do you ever stop taking your medications because you don't believe they are helping? [] Yes (1) [x] No (0)  Total score 0   Recommendations given to patient about increasing adherence: None. Spoke with patient's pharmacy and there are no refills on apixaban.   Guideline-Directed Medical Therapy/Evidence Based Medicine  ACE/ARB/ARNI: None. Has been on lisinopril and Entresto in the past Beta Blocker: Metoprolol tartrate 12.5 mg BID Aldosterone Antagonist: None Diuretic: Furosemide 60 mg BID    SUBJECTIVE  HPI: Patient is a 74 y/o M with PMH as below who presents to CHF clinic for follow-up.   Past Medical History:  Diagnosis Date  . Arrhythmia    atrial fibrillation  . Basal cell carcinoma 05/2019   right nasal ala, Tx: EDC  . CHF (congestive heart failure) ([place])   . COPD (chronic obstructive pulmonary disease) ([place])   . Coronary artery disease   . Diabetes mellitus without complication ([place])   . GERD (gastroesophageal reflux disease)   . HLD (hyperlipidemia)   . Hypertension   . Peripheral vascular disease ([place])   . Pneumonia   . Prostate cancer ([place])   . Stroke [facility])        OBJECTIVE   Vital signs: HR 65, BP 119/64, weight (pounds) 175 ECHO: Date 12/26/19, EF 35-40% , notes: LV global hypokinesis, grade 1 diastolic dysfunction, mildly  elevated PA systolic pressure, mildly dilated LA, mildly dilated RA, mild MVR, mild TVR, mild AV stenosis  BMP Latest Ref Rng & Units 12/29/2019 12/28/2019 12/27/2019  Glucose 70 - 99 mg/dL 197(H) 133(H) 218(H)  BUN 8 - 23 mg/dL 45(H) 35(H) 30(H)  Creatinine 0.61 - 1.24 mg/dL 1.99(H) 1.67(H) 1.85(H)  Sodium 135 - 145 mmol/L 132(L) 135 135  Potassium 3.5 - 5.1 mmol/L 4.2 4.1 4.4  Chloride 98 - 111 mmol/L 95(L) 97(L) 99  CO2 22 - 32 mmol/L 28 30 25   Calcium 8.9 - 10.3 mg/dL 8.5(L) 8.6(L) 8.6(L)    ASSESSMENT Patient is well-appearing. Taking medications at home as prescribed. Denies side effects. Checks blood pressure at home. Did not bring log. Denies hypotension / recent falls. Reports breathing has been stable.   Normalized CrCl 33 mL/min based on most recent Scr of 1.99   PLAN  1). CHF -Furosemide 60 mg BID + potassium 10 mEq / day, metoprolol tartrate 12.5 mg BID -Further addition of RAAS agent limited by soft pressures -Spoke with provider - no changes to medication regimen today  2). Atrial fibrillation -New onset during 12/2019 hospitalization -Rate control with metoprolol tartrate 12.5 mg BID. HR 65 today -Stroke prophylaxis with apixaban 5 mg BID. Patient has been out of this medication. Spoke with pharmacy - no refills left on prescription -Spoke with provider - they will write for new Rx  3). Hypertension -Amlodipine 5 mg daily, furosemide 60 mg BID, hydralazine 50 mg TID, metoprolol tartrate 12.5 mg  BID -Checking blood pressure at home. Did not bring log. Denies signs/symptoms of hypotension -Spoke with provider - may consider discontinuation of amlodipine / hydralazine in future in favor of RAAS agent   4). T2DM -Last HgbA1c was 7.6% on 12/26/19 which had decreased from 8.9% on 10/12/2019 -Antidiabetic regimen includes insulin aspart sliding scale TIDAC, insulin glargine 47 units daily -Would likely benefit from SGLT-2 inhibitor if renal function permits or GLP-1 RA  otherwise  5). CKD stage G3b -Likely secondary to diabetes / hypertension -Baseline SCr appears to be 1.6 - 2.0 -Last ACR 667 on 09/01/2019  -Would likely benefit with addition of RAAS agent if blood pressure tolerates  6). CAD s/p CABG -Denies chest pain -Aspirin 81 mg daily, isosorbide 30 mg daily -Clopidogrel d/c upon initiation of apixaban  7). Hx of CVA -Aspirin 81 mg daily + simvastatin 20 mg HS  8). COPD -Ongoing tobacco use -Albuterol HFA PRN + Combivent HFA PRN -Patient may benefit from addition of maintenance inhaler e.g. Spiriva   Time spent: 15 minutes  [place] Resident 03/12/2020 10:31 AM    Current Outpatient Medications:  .  acetaminophen (TYLENOL) 325 MG tablet, Take 2 tablets (650 mg total) by mouth every 6 (six) hours as needed for mild pain (or Fever >/= 101)., Disp: , Rfl:  .  albuterol (PROVENTIL HFA;VENTOLIN HFA) 108 (90 Base) MCG/ACT inhaler, Inhale 2 puffs into the lungs every 6 (six) hours as needed for wheezing or shortness of breath., Disp: , Rfl:  .  amLODipine (NORVASC) 5 MG tablet, Take 1 tablet (5 mg total) by mouth daily., Disp: 30 tablet, Rfl: 1 .  apixaban (ELIQUIS) 5 MG TABS tablet, Take 1 tablet (5 mg total) by mouth 2 (two) times daily., Disp: 60 tablet, Rfl: 1 .  aspirin EC 81 MG tablet, Take 81 mg by mouth daily., Disp: , Rfl:  .  bifidobacterium infantis (ALIGN) capsule, Take 1 capsule by mouth every morning., Disp: , Rfl:  .  cholecalciferol (VITAMIN D) 1000 units tablet, Take 1,000 Units by mouth daily., Disp: , Rfl:  .  Cinnamon 500 MG capsule, Take 1,000 mg by mouth 2 (two) times daily., Disp: , Rfl:  .  CRANBERRY EXTRACT PO, Take 500 mg by mouth every morning. , Disp: , Rfl:  .  Ensure Max Protein (ENSURE MAX PROTEIN) LIQD, Take 330 mLs (11 oz total) by mouth 2 (two) times daily between meals., Disp:  , Rfl:  .  furosemide (LASIX) 20 MG tablet, Take 3 tablets (60 mg total) by mouth 2 (two) times daily., Disp: 30  tablet, Rfl: 1 .  gabapentin (NEURONTIN) 100 MG capsule, Take 100 mg by mouth 2 (two) times daily., Disp: , Rfl:  .  glucose 4 GM chewable tablet, Chew 1 tablet by mouth as needed for low blood sugar., Disp: , Rfl:  .  hydrALAZINE (APRESOLINE) 50 MG tablet, Take 1 tablet (50 mg total) by mouth every 8 (eight) hours., Disp:  , Rfl:  .  insulin aspart (NOVOLOG) 100 UNIT/ML injection, 0-9 Units, Subcutaneous, 3 times daily with meals CBG < 70: Implement Hypoglycemia protocol/measures CBG 70 - 120: 0 units CBG 121 - 150: 1 unit CBG 151 - 200: 2 units CBG 201 - 250: 3 units CBG 251 - 300: 5 units CBG 301 - 350: 7 units CBG 351 - 400: 9 units CBG > 400: call MD, Disp: 10 mL, Rfl: 11 .  Ipratropium-Albuterol (COMBIVENT) 20-100 MCG/ACT AERS respimat, Inhale 2 puffs into the  lungs 4 (four) times daily as needed., Disp: , Rfl:  .  isosorbide mononitrate (IMDUR) 30 MG 24 hr tablet, Take 1 tablet (30 mg total) by mouth daily., Disp:  , Rfl:  .  LANTUS SOLOSTAR 100 UNIT/ML Solostar Pen, Inject 45 Units into the skin daily at 10 pm., Disp: 15 mL, Rfl: 11 .  loperamide (IMODIUM) 2 MG capsule, Take 4 mg by mouth as needed for diarrhea or loose stools., Disp: , Rfl:  .  Magnesium 250 MG TABS, Take 250 mg by mouth daily., Disp: , Rfl:  .  metoprolol tartrate (LOPRESSOR) 25 MG tablet, Take 0.5 tablets (12.5 mg total) by mouth 2 (two) times daily., Disp: 15 tablet, Rfl: 0 .  Misc Natural Products (TOTAL MEMORY & FOCUS FORMULA PO), Take 1 capsule by mouth See admin instructions. Take 2 capsule at 9:00 AM, 1 capsule at 5:00 PM, and 1 capsule at bedtime., Disp: , Rfl:  .  Multiple Vitamin (MULTIVITAMIN WITH MINERALS) TABS tablet, Take 1 tablet by mouth daily., Disp: , Rfl:  .  multivitamin-lutein (OCUVITE-LUTEIN) CAPS capsule, Take 1 capsule by mouth daily., Disp: , Rfl:  .  omeprazole (PRILOSEC) 20 MG capsule, Take 20 mg by mouth daily. , Disp: , Rfl:  .  potassium chloride (K-DUR) 10 MEQ tablet, Take 10 mEq by mouth  daily., Disp: , Rfl:  .  simvastatin (ZOCOR) 20 MG tablet, Take 20 mg by mouth at bedtime., Disp: , Rfl:  .  SUPER B COMPLEX/C PO, Take 1 tablet by mouth every morning., Disp: , Rfl:  .  vitamin C (ASCORBIC ACID) 500 MG tablet, Take 500 mg by mouth 2 (two) times daily. , Disp: , Rfl:    COUNSELING POINTS/CLINICAL PEARLS  Metoprolol Succinate (Goal: 200 mg once daily) Warn patient to avoid activities requiring mental alertness or coordination until drug effects are realized, as drug may cause dizziness. Tell patient planning major surgery with anesthesia to alert physician that drug is being used, as drug impairs ability of heart to respond to reflex adrenergic stimuli. Drug may cause diarrhea, fatigue, headache, or depression. Advise diabetic patient to carefully monitor blood glucose as drug may mask symptoms of hypoglycemia. Patient should take extended-release tablet with or immediately following meals. Counsel patient against sudden discontinuation of drug, as this may precipitate hypertension, angina, or myocardial infarction. In the event of a missed dose, counsel patient to skip the missed dose and maintain a regular dosing schedule. Furosemide  Drug causes sun-sensitivity. Advise patient to use sunscreen and avoid tanning beds. Patient should avoid activities requiring coordination until drug effects are realized, as drug may cause dizziness, vertigo, or blurred vision. This drug may cause hyperglycemia, hyperuricemia, constipation, diarrhea, loss of appetite, nausea, vomiting, purpuric disorder, cramps, spasticity, asthenia, headache, paresthesia, or scaling eczema. Instruct patient to report unusual bleeding/bruising or signs/symptoms of hypotension, infection, pancreatitis, or ototoxicity (tinnitus, hearing impairment). Advise patient to report signs/symptoms of a severe skin reactions (flu-like symptoms, spreading red rash, or skin/mucous membrane blistering) or erythema  multiforme. Instruct patient to eat high-potassium foods during drug therapy, as directed by healthcare professional.  Patient should not drink alcohol while taking this drug.  DRUGS TO AVOID IN HEART FAILURE  Drug or Class Mechanism  Analgesics . NSAIDs . COX-2 inhibitors . Glucocorticoids  Sodium and water retention, increased systemic vascular resistance, decreased response to diuretics   Diabetes Medications . Metformin . Thiazolidinediones o Rosiglitazone (Avandia) o Pioglitazone (Actos) . DPP4 Inhibitors o Saxagliptin (Onglyza) o Sitagliptin (Januvia)  Lactic acidosis Possible calcium channel blockade   Unknown  Antiarrhythmics . Class I  o Flecainide o Disopyramide . Class III o Sotalol . Other o Dronedarone  Negative inotrope, proarrhythmic   Proarrhythmic, beta blockade  Negative inotrope  Antihypertensives . Alpha Blockers o Doxazosin . Calcium Channel Blockers o Diltiazem o Verapamil o Nifedipine . Central Alpha Adrenergics o Moxonidine . Peripheral Vasodilators o Minoxidil  Increases renin and aldosterone  Negative inotrope    Possible sympathetic withdrawal  Unknown  Anti-infective . Itraconazole . Amphotericin B  Negative inotrope Unknown  Hematologic . Anagrelide . Cilostazol   Possible inhibition of PD IV Inhibition of PD III causing arrhythmias  Neurologic/Psychiatric . Stimulants . Anti-Seizure Drugs o Carbamazepine o Pregabalin . Antidepressants o Tricyclics o Citalopram . Parkinsons o Bromocriptine o Pergolide o Pramipexole . Antipsychotics o Clozapine . Antimigraine o Ergotamine o Methysergide . Appetite suppressants . Bipolar o Lithium  Peripheral alpha and beta agonist activity  Negative inotrope and chronotrope Calcium channel blockade  Negative inotrope, proarrhythmic Dose-dependent QT prolongation  Excessive serotonin activity/valvular damage Excessive serotonin activity/valvular  damage Unknown  IgE mediated hypersensitivy, calcium channel blockade  Excessive serotonin activity/valvular damage Excessive serotonin activity/valvular damage Valvular damage  Direct myofibrillar degeneration, adrenergic stimulation  Antimalarials . Chloroquine . Hydroxychloroquine Intracellular inhibition of lysosomal enzymes  Urologic Agents . Alpha Blockers o Doxazosin o Prazosin o Tamsulosin o Terazosin  Increased renin and aldosterone  Adapted from Page RL, et al. "Drugs That May Cause or Exacerbate Heart Failure: A Scientific Statement from the [place]." Circulation 2016; 366:Q94-T65. DOI: 10.1161/CIR.0000000000000426   MEDICATION ADHERENCES TIPS AND STRATEGIES 1. Taking medication as prescribed improves patient outcomes in heart failure (reduces hospitalizations, improves symptoms, increases survival) 2. Side effects of medications can be managed by decreasing doses, switching agents, stopping drugs, or adding additional therapy. Please let someone in the [facility] know if you have having bothersome side effects so we can modify your regimen. Do not alter your medication regimen without talking to [place].  3. Medication reminders can help patients remember to take drugs on time. If you are missing or forgetting doses you can try linking behaviors, using pill boxes, or an electronic reminder like an alarm on your phone or an app. Some people can also get automated phone calls as medication reminders.

## 2020-03-12 NOTE — Progress Notes (Signed)
Radiation Oncology Follow up Note  Name: [name]   Date:   03/12/2020 MRN:  [id number] DOB: [birth year]    This 74 y.o. male presents to the clinic today for 3-year follow-up status post IMRT radiation therapy for Gleason 7 adenocarcinoma the prostate.  REFERRING PROVIDER: [name], MD  HPI: Patient is a 74 year old male now at 3 years having completed IMRT radiation therapy to his prostate for Gleason 7 adenocarcinoma presenting with a PSA of 6.5.  Seen today in routine follow-up he is doing well.  He specifically denies any increased lower urinary tract symptoms diarrhea or fatigue most recent PSA is 0.22.  Which is down from 0.491-year prior.  COMPLICATIONS OF TREATMENT: none  FOLLOW UP COMPLIANCE: keeps appointments   PHYSICAL EXAM:  BP 103/65 (BP Location: Left Arm, Patient Position: Sitting, Cuff Size: Small)   Pulse 61   Temp (!) 97.5 F (36.4 C) (Tympanic)   Resp 16   Wt 173 lb 3.2 oz (78.6 kg)   BMI 27.96 kg/m  Wheelchair-bound male in NAD.  Well-developed well-nourished patient in NAD. HEENT reveals PERLA, EOMI, discs not visualized.  Oral cavity is clear. No oral mucosal lesions are identified. Neck is clear without evidence of cervical or supraclavicular adenopathy. Lungs are clear to A&P. Cardiac examination is essentially unremarkable with regular rate and rhythm without murmur rub or thrill. Abdomen is benign with no organomegaly or masses noted. Motor sensory and DTR levels are equal and symmetric in the upper and lower extremities. Cranial nerves II through XII are grossly intact. Proprioception is intact. No peripheral adenopathy or edema is identified. No motor or sensory levels are noted. Crude visual fields are within normal range.  RADIOLOGY RESULTS: No current films to review  PLAN: Present time patient is doing well under excellent biochemical control of his prostate cancer.  And pleased with his overall progress.  I have asked to see him back in 1 year  for follow-up with a PSA at that time.  Patient knows to call with any concerns at any time.  I would like to take this opportunity to thank you for allowing me to participate in the care of your patient.Noreene Filbert, MD

## 2020-03-21 DIAGNOSIS — R262 Difficulty in walking, not elsewhere classified: Secondary | ICD-10-CM | POA: Diagnosis not present

## 2020-03-21 DIAGNOSIS — H539 Unspecified visual disturbance: Secondary | ICD-10-CM | POA: Diagnosis not present

## 2020-03-21 DIAGNOSIS — Z8679 Personal history of other diseases of the circulatory system: Secondary | ICD-10-CM | POA: Diagnosis not present

## 2020-03-21 DIAGNOSIS — R278 Other lack of coordination: Secondary | ICD-10-CM | POA: Diagnosis not present

## 2020-03-21 DIAGNOSIS — Z8673 Personal history of transient ischemic attack (TIA), and cerebral infarction without residual deficits: Secondary | ICD-10-CM | POA: Diagnosis not present

## 2020-04-03 DIAGNOSIS — E1142 Type 2 diabetes mellitus with diabetic polyneuropathy: Secondary | ICD-10-CM | POA: Diagnosis not present

## 2020-04-03 DIAGNOSIS — Z79899 Other long term (current) drug therapy: Secondary | ICD-10-CM | POA: Diagnosis not present

## 2020-04-03 DIAGNOSIS — I5023 Acute on chronic systolic (congestive) heart failure: Secondary | ICD-10-CM | POA: Diagnosis not present

## 2020-04-03 DIAGNOSIS — N179 Acute kidney failure, unspecified: Secondary | ICD-10-CM | POA: Diagnosis not present

## 2020-04-09 DIAGNOSIS — E440 Moderate protein-calorie malnutrition: Secondary | ICD-10-CM | POA: Diagnosis not present

## 2020-04-09 DIAGNOSIS — E1142 Type 2 diabetes mellitus with diabetic polyneuropathy: Secondary | ICD-10-CM | POA: Diagnosis not present

## 2020-04-09 DIAGNOSIS — I152 Hypertension secondary to endocrine disorders: Secondary | ICD-10-CM | POA: Diagnosis not present

## 2020-04-09 DIAGNOSIS — J449 Chronic obstructive pulmonary disease, unspecified: Secondary | ICD-10-CM | POA: Diagnosis not present

## 2020-04-09 DIAGNOSIS — E1169 Type 2 diabetes mellitus with other specified complication: Secondary | ICD-10-CM | POA: Diagnosis not present

## 2020-04-09 DIAGNOSIS — E1159 Type 2 diabetes mellitus with other circulatory complications: Secondary | ICD-10-CM | POA: Diagnosis not present

## 2020-04-09 DIAGNOSIS — I5022 Chronic systolic (congestive) heart failure: Secondary | ICD-10-CM | POA: Diagnosis not present

## 2020-04-09 DIAGNOSIS — N1832 Chronic kidney disease, stage 3b: Secondary | ICD-10-CM | POA: Diagnosis not present

## 2020-04-09 DIAGNOSIS — Z794 Long term (current) use of insulin: Secondary | ICD-10-CM | POA: Diagnosis not present

## 2020-04-09 DIAGNOSIS — E785 Hyperlipidemia, unspecified: Secondary | ICD-10-CM | POA: Diagnosis not present

## 2020-05-02 DIAGNOSIS — E1159 Type 2 diabetes mellitus with other circulatory complications: Secondary | ICD-10-CM | POA: Diagnosis not present

## 2020-05-02 DIAGNOSIS — I25810 Atherosclerosis of coronary artery bypass graft(s) without angina pectoris: Secondary | ICD-10-CM | POA: Diagnosis not present

## 2020-05-02 DIAGNOSIS — I5022 Chronic systolic (congestive) heart failure: Secondary | ICD-10-CM | POA: Diagnosis not present

## 2020-05-02 DIAGNOSIS — I350 Nonrheumatic aortic (valve) stenosis: Secondary | ICD-10-CM | POA: Diagnosis not present

## 2020-05-02 DIAGNOSIS — I5021 Acute systolic (congestive) heart failure: Secondary | ICD-10-CM | POA: Diagnosis not present

## 2020-05-02 DIAGNOSIS — I152 Hypertension secondary to endocrine disorders: Secondary | ICD-10-CM | POA: Diagnosis not present

## 2020-05-02 DIAGNOSIS — I639 Cerebral infarction, unspecified: Secondary | ICD-10-CM | POA: Diagnosis not present

## 2020-06-02 ENCOUNTER — Encounter: Payer: Self-pay | Admitting: Emergency Medicine

## 2020-06-02 ENCOUNTER — Emergency Department: Payer: PPO

## 2020-06-02 ENCOUNTER — Other Ambulatory Visit: Payer: Self-pay

## 2020-06-02 ENCOUNTER — Emergency Department
Admission: EM | Admit: 2020-06-02 | Discharge: 2020-06-02 | Disposition: A | Discharge status: Home / Self Care | Payer: PPO | Attending: Emergency Medicine | Admitting: Emergency Medicine

## 2020-06-02 DIAGNOSIS — I119 Hypertensive heart disease without heart failure: Secondary | ICD-10-CM | POA: Insufficient documentation

## 2020-06-02 DIAGNOSIS — F17210 Nicotine dependence, cigarettes, uncomplicated: Secondary | ICD-10-CM | POA: Diagnosis not present

## 2020-06-02 DIAGNOSIS — Z951 Presence of aortocoronary bypass graft: Secondary | ICD-10-CM | POA: Insufficient documentation

## 2020-06-02 DIAGNOSIS — R0602 Shortness of breath: Secondary | ICD-10-CM | POA: Diagnosis not present

## 2020-06-02 DIAGNOSIS — Z794 Long term (current) use of insulin: Secondary | ICD-10-CM | POA: Insufficient documentation

## 2020-06-02 DIAGNOSIS — J449 Chronic obstructive pulmonary disease, unspecified: Secondary | ICD-10-CM | POA: Diagnosis not present

## 2020-06-02 DIAGNOSIS — J811 Chronic pulmonary edema: Secondary | ICD-10-CM | POA: Diagnosis not present

## 2020-06-02 DIAGNOSIS — Z7982 Long term (current) use of aspirin: Secondary | ICD-10-CM | POA: Insufficient documentation

## 2020-06-02 DIAGNOSIS — E119 Type 2 diabetes mellitus without complications: Secondary | ICD-10-CM | POA: Diagnosis not present

## 2020-06-02 DIAGNOSIS — I5023 Acute on chronic systolic (congestive) heart failure: Secondary | ICD-10-CM | POA: Diagnosis not present

## 2020-06-02 DIAGNOSIS — I110 Hypertensive heart disease with heart failure: Secondary | ICD-10-CM | POA: Diagnosis not present

## 2020-06-02 LAB — CBC
HCT: 44.8 % (ref 39.0–52.0)
Hemoglobin: 14 g/dL (ref 13.0–17.0)
MCH: 25.5 pg — ABNORMAL LOW (ref 26.0–34.0)
MCHC: 31.3 g/dL (ref 30.0–36.0)
MCV: 81.5 fL (ref 80.0–100.0)
Platelets: 263 10*3/uL (ref 150–400)
RBC: 5.5 MIL/uL (ref 4.22–5.81)
RDW: 17.5 % — ABNORMAL HIGH (ref 11.5–15.5)
WBC: 11.8 10*3/uL — ABNORMAL HIGH (ref 4.0–10.5)
nRBC: 0 % (ref 0.0–0.2)

## 2020-06-02 LAB — BASIC METABOLIC PANEL
Anion gap: 13 (ref 5–15)
BUN: 37 mg/dL — ABNORMAL HIGH (ref 8–23)
CO2: 30 mmol/L (ref 22–32)
Calcium: 9.2 mg/dL (ref 8.9–10.3)
Chloride: 95 mmol/L — ABNORMAL LOW (ref 98–111)
Creatinine, Ser: 1.88 mg/dL — ABNORMAL HIGH (ref 0.61–1.24)
GFR calc Af Amer: 40 mL/min — ABNORMAL LOW (ref >60)
GFR calc non Af Amer: 34 mL/min — ABNORMAL LOW (ref >60)
Glucose, Bld: 156 mg/dL — ABNORMAL HIGH (ref 70–99)
Potassium: 3.6 mmol/L (ref 3.5–5.1)
Sodium: 138 mmol/L (ref 135–145)

## 2020-06-02 LAB — BRAIN NATRIURETIC PEPTIDE: B Natriuretic Peptide: 1179.3 pg/mL — ABNORMAL HIGH (ref 0.0–100.0)

## 2020-06-02 MED ORDER — POTASSIUM CHLORIDE ER 10 MEQ PO TBCR: 10.0000 meq | EXTENDED_RELEASE_TABLET | Freq: Every day | ORAL | 0 refills | Status: DC

## 2020-06-02 MED ORDER — FUROSEMIDE 10 MG/ML IJ SOLN
40.0000 mg | Freq: Once | INTRAMUSCULAR | Status: AC
  Administered 2020-06-02: 40 mg via INTRAVENOUS
  Filled 2020-06-02: qty 4

## 2020-06-02 MED ORDER — SODIUM CHLORIDE 0.9% FLUSH: 3.0000 mL | Freq: Once | INTRAVENOUS | Status: DC

## 2020-06-02 MED ORDER — POTASSIUM CHLORIDE CRYS ER 20 MEQ PO TBCR
20.0000 meq | EXTENDED_RELEASE_TABLET | Freq: Once | ORAL | Status: AC
  Administered 2020-06-02: 20 meq via ORAL
  Filled 2020-06-02: qty 1

## 2020-06-02 NOTE — ED Notes (Signed)
VS obtained by this RN. Pt visualized sitting in wheelchair at this time. This RN apologized for and explained delay.

## 2020-06-02 NOTE — ED Notes (Signed)
Pt ambulated on room air. Increased WOB reported, but over all pt appeared to tolerate well. Pt gait and speed was consistent with baseline.  Pt SpO2 maintained at 93-96%, compared to 95-99% pre-ambulation

## 2020-06-02 NOTE — ED Triage Notes (Signed)
Pt presents to ED via POV with c/o SOB since yesterday. Pt states increasingly worse over night. Pt states in the past has taken his furosemide with relief, pt states "I've been taking it all night and it hasn't helped".   Pt with noted abdominal distention on arrival to ED, tachypnea on arrival to ED.

## 2020-06-02 NOTE — Discharge Instructions (Addendum)
Increase your Lasix by 40 mg every day.  Take 60 in the morning, 60 at night, 40 in the middle of the day.  Return to emergency department for any peripheral swelling, worsening shortness of breath, chest pain.  I will increase your potassium over the next several days with an additional 10 mEq tablet.  Take this in addition to your regular potassium.

## 2020-06-02 NOTE — ED Provider Notes (Signed)
[facility] Emergency Department Provider Note  ____________________________________________  Time seen: Approximately 1:58 PM  I have reviewed the triage vital signs and the nursing notes.   HISTORY  Chief Complaint Shortness of Breath    HPI [name] is a 74 y.o. male who presents the emergency department complaining of shortness of breath x1 week.  Patient states that he has a history of CHF, A. fib, COPD, coronary artery disease, diabetes, GERD, peripheral vascular disease, CVA.  He states that over the past week he has had slowly progressive shortness of breath.  No fevers or chills, no URI symptoms of nasal congestion or sore throat.  Patient has had no cough.  Patient believes that it is increasing fluid from his CHF.  He tried taking increased doses of his diuretics yesterday but states that this did not help.  He has been seen in the CHF clinic before for similar symptoms but states that he has not had a visit with the clinic in several months.  Patient denies any headache, visual changes, chest pain, abdominal pain, nausea or vomiting.         Past Medical History:  Diagnosis Date   Arrhythmia    atrial fibrillation   Basal cell carcinoma 05/2019   right nasal ala, Tx: [facility]   CHF (congestive heart failure) (HCC)    COPD (chronic obstructive pulmonary disease) ([place])    Coronary artery disease    Diabetes mellitus without complication (HCC)    GERD (gastroesophageal reflux disease)    HLD (hyperlipidemia)    Hypertension    Peripheral vascular disease ([place])    Pneumonia    Prostate cancer ([place])    Stroke [facility])     Patient Active Problem List   Diagnosis Date Noted   Malnutrition of moderate degree 12/27/2019   Hypertensive emergency 12/26/2019   Hyperglycemia due to type 2 diabetes mellitus ([place]) 12/26/2019   Chronic kidney disease (CKD) stage G3b/A3, moderately decreased glomerular filtration rate (GFR) between 30-44  mL/min/1.73 square meter and albuminuria creatinine ratio greater than 300 mg/g 12/26/2019   History of COVID-19 12/26/2019   History of CVA (cerebrovascular accident) 12/26/2019   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   History of second degree heart block 12/26/2019   Elevated LFTs 12/26/2019   Pneumonia due to COVID-19 virus    Acute systolic CHF (congestive heart failure) ([place]) 10/11/2019   Malignant neoplasm of prostate ([place]) 11/24/2016   Bright red rectal bleeding 10/02/2016   COPD with acute exacerbation ([place]) 86/76/1951   Chronic systolic heart failure ([place]) 12/12/2015   Essential hypertension 12/12/2015   COPD (chronic obstructive pulmonary disease) with chronic bronchitis ([place]) 12/12/2015   Diabetes ([place]) 12/12/2015   Tobacco abuse 12/12/2015   Acute respiratory failure with hypoxia ([place]) 12/03/2015    Past Surgical History:  Procedure Laterality Date   COLONOSCOPY     CORONARY ARTERY BYPASS GRAFT  2006   EYE SURGERY     GOLD SEED IMPLANT N/A 12/30/2016   Procedure: GOLD SEED IMPLANT  x3;  Surgeon: Hollice Espy, MD;  Location: [facility] ORS;  Service: Urology;  Laterality: N/A;   HEMORRHOID SURGERY N/A 10/02/2016   Procedure: PROCTOSCOPY AND CONTROL OF RECTAL BLEEDING;  Surgeon: Excell Seltzer, MD;  Location: [facility] ORS;  Service: General;  Laterality: N/A;   PROSTATE BIOPSY     TONSILLECTOMY      Prior to Admission medications   Medication Sig Start Date End Date Taking? Authorizing Provider  acetaminophen (TYLENOL) 325 MG tablet Take  2 tablets (650 mg total) by mouth every 6 (six) hours as needed for mild pain (or Fever >/= 101). 01/20/16   Gouru, Illene Silver, MD  albuterol (PROVENTIL HFA;VENTOLIN HFA) 108 (90 Base) MCG/ACT inhaler Inhale 2 puffs into the lungs every 6 (six) hours as needed for wheezing or shortness of breath.    [provider]  amLODipine (NORVASC) 5 MG tablet Take 1 tablet (5 mg total) by mouth daily. 12/30/19   Ezekiel Slocumb, DO  apixaban  (ELIQUIS) 5 MG TABS tablet Take 1 tablet (5 mg total) by mouth 2 (two) times daily. 03/12/20   Alisa Graff, FNP  aspirin EC 81 MG tablet Take 81 mg by mouth daily.    [provider]  bifidobacterium infantis (ALIGN) capsule Take 1 capsule by mouth every morning.    [provider]  cholecalciferol (VITAMIN D) 1000 units tablet Take 1,000 Units by mouth daily.    [provider]  Cinnamon 500 MG capsule Take 1,000 mg by mouth 2 (two) times daily.    [provider]  CRANBERRY EXTRACT PO Take 500 mg by mouth every morning.     [provider]  Ensure Max Protein (ENSURE MAX PROTEIN) LIQD Take 330 mLs (11 oz total) by mouth 2 (two) times daily between meals. 12/29/19   Ezekiel Slocumb, DO  furosemide (LASIX) 20 MG tablet Take 3 tablets (60 mg total) by mouth 2 (two) times daily. 12/29/19   Ezekiel Slocumb, DO  gabapentin (NEURONTIN) 100 MG capsule Take 100 mg by mouth 2 (two) times daily.    [provider]  glucose 4 GM chewable tablet Chew 1 tablet by mouth as needed for low blood sugar.    [provider]  hydrALAZINE (APRESOLINE) 50 MG tablet Take 1 tablet (50 mg total) by mouth every 8 (eight) hours. 10/15/19   Ghimire, Henreitta Leber, MD  insulin aspart (NOVOLOG) 100 UNIT/ML injection 0-9 Units, Subcutaneous, 3 times daily with meals CBG < 70: Implement Hypoglycemia protocol/measures CBG 70 - 120: 0 units CBG 121 - 150: 1 unit CBG 151 - 200: 2 units CBG 201 - 250: 3 units CBG 251 - 300: 5 units CBG 301 - 350: 7 units CBG 351 - 400: 9 units CBG > 400: call MD 10/15/19   Jonetta Osgood, MD  Ipratropium-Albuterol (COMBIVENT) 20-100 MCG/ACT AERS respimat Inhale 2 puffs into the lungs 4 (four) times daily as needed. 09/08/19 09/07/20  [provider]  isosorbide mononitrate (IMDUR) 30 MG 24 hr tablet Take 1 tablet (30 mg total) by mouth daily. 10/15/19   Ghimire, Henreitta Leber, MD  LANTUS SOLOSTAR 100 UNIT/ML Solostar Pen  Inject 45 Units into the skin daily at 10 pm. Patient taking differently: Inject 47 Units into the skin daily at 10 pm.  10/15/19   Ghimire, Henreitta Leber, MD  Magnesium 250 MG TABS Take 250 mg by mouth daily.    [provider]  metoprolol tartrate (LOPRESSOR) 25 MG tablet Take 0.5 tablets (12.5 mg total) by mouth 2 (two) times daily. 12/29/19   Ezekiel Slocumb, DO  Misc Natural Products (TOTAL MEMORY & FOCUS FORMULA PO) Take 1 capsule by mouth See admin instructions. Take 2 capsule at 9:00 AM, 1 capsule at 5:00 PM, and 1 capsule at bedtime.    [provider]  Multiple Vitamin (MULTIVITAMIN WITH MINERALS) TABS tablet Take 1 tablet by mouth daily.    [provider]  multivitamin-lutein (OCUVITE-LUTEIN) CAPS capsule Take 1 capsule  by mouth daily.    [provider]  omeprazole (PRILOSEC) 20 MG capsule Take 20 mg by mouth daily.     [provider]  potassium chloride (K-DUR) 10 MEQ tablet Take 10 mEq by mouth daily.    [provider]  potassium chloride (KLOR-CON) 10 MEQ tablet Take 1 tablet (10 mEq total) by mouth daily. 06/02/20   Adalena Abdulla, Charline Bills, PA-C  simvastatin (ZOCOR) 20 MG tablet Take 20 mg by mouth at bedtime.    [provider]  SUPER B COMPLEX/C PO Take 1 tablet by mouth every morning.    [provider]  vitamin C (ASCORBIC ACID) 500 MG tablet Take 500 mg by mouth 2 (two) times daily.     [provider]    Allergies Patient has no known allergies.  Family History  Problem Relation Age of Onset   Lung cancer Mother    Heart attack Father    Hypertension Other    Cancer Maternal Grandfather        prostate   Cancer Paternal Grandmother        breast    Social History Social History   Tobacco Use   Smoking status: Current Every Day Smoker    Packs/day: 1.00    Years: 54.00    Pack years: 54.00    Types: Cigarettes   Smokeless tobacco: Never Used  Scientific laboratory technician Use:  Never used  Substance Use Topics   Alcohol use: No    Alcohol/week: 0.0 standard drinks   Drug use: No     Review of Systems  Constitutional: No fever/chills Eyes: No visual changes. No discharge ENT: No upper respiratory complaints. Cardiovascular: no chest pain. Respiratory: no cough. No SOB. Gastrointestinal: No abdominal pain.  No nausea, no vomiting.  No diarrhea.  No constipation. Genitourinary: Negative for dysuria. No hematuria Musculoskeletal: Negative for musculoskeletal pain. Skin: Negative for rash, abrasions, lacerations, ecchymosis. Neurological: Negative for headaches, focal weakness or numbness. 10-point ROS otherwise negative.  ____________________________________________   PHYSICAL EXAM:  VITAL SIGNS: ED Triage Vitals  Enc Vitals Group     BP 06/02/20 0804 (!) 148/65     Pulse Rate 06/02/20 0804 65     Resp 06/02/20 0804 (!) 28     Temp 06/02/20 0804 98.6 F (37 C)     Temp Source 06/02/20 0804 Oral     SpO2 06/02/20 0804 99 %     Weight 06/02/20 0805 175 lb 0.7 oz (79.4 kg)     Height 06/02/20 0805 5\' 6"  (1.676 m)     Head Circumference --      Peak Flow --      Pain Score 06/02/20 0805 0     Pain Loc --      Pain Edu? --      Excl. in [place]? --      Constitutional: Alert and oriented. Well appearing and in no acute distress. Eyes: Conjunctivae are normal. PERRL. EOMI. Head: Atraumatic. ENT:      Ears:       Nose: No congestion/rhinnorhea.      Mouth/Throat: Mucous membranes are moist.  Neck: No stridor.   Hematological/Lymphatic/Immunilogical: No cervical lymphadenopathy. Cardiovascular: Normal rate, regular rhythm. Normal S1 and S2.  Good peripheral circulation. Respiratory: Normal respiratory effort without tachypnea or retractions. Lungs CTAB.  No appreciable wheezing, rales, or rhonchi.  Good air entry to the bases with no decreased or absent breath sounds.  No increased work of breathing  with accessory muscle use, belly  breathing. Musculoskeletal: Full range of motion to all extremities. No gross deformities appreciated. Neurologic:  Normal speech and language. No gross focal neurologic deficits are appreciated.  Skin:  Skin is warm, dry and intact. No rash noted. Psychiatric: Mood and affect are normal. Speech and behavior are normal. Patient exhibits appropriate insight and judgement.   ____________________________________________   LABS (all labs ordered are listed, but only abnormal results are displayed)  Labs Reviewed  BASIC METABOLIC PANEL - Abnormal; Notable for the following components:      Result Value   Chloride 95 (*)    Glucose, Bld 156 (*)    BUN 37 (*)    Creatinine, Ser 1.88 (*)    GFR calc non Af Amer 34 (*)    GFR calc Af Amer 40 (*)    All other components within normal limits  CBC - Abnormal; Notable for the following components:   WBC 11.8 (*)    MCH 25.5 (*)    RDW 17.5 (*)    All other components within normal limits  BRAIN NATRIURETIC PEPTIDE - Abnormal; Notable for the following components:   B Natriuretic Peptide 1,179.3 (*)    All other components within normal limits   ____________________________________________  EKG   ____________________________________________  RADIOLOGY I personally viewed and evaluated these images as part of my medical decision making, as well as reviewing the written report by the radiologist.  DG Chest 2 View  Result Date: 06/02/2020 CLINICAL DATA:  74 year old male with shortness of breath EXAM: CHEST - 2 VIEW COMPARISON:  Prior chest x-rays 12/26/2019 and 09/26/2019 FINDINGS: Stable cardiac and mediastinal contours. Atherosclerotic calcifications are present in the transverse aorta. The patient is status post median sternotomy with evidence of prior multivessel CABG. Pulmonary vascular congestion with mild interstitial edema. Ker[place] B-lines are present in the periphery of the lower lungs. Stellate opacity projecting over the left  lung base is similar compared to prior imaging from November of 2020 and likely represents a region of lingular scarring. IMPRESSION: 1. Pulmonary vascular congestion with mild interstitial edema consistent with mild CHF. 2.  Aortic Atherosclerosis (ICD10-170.0) Electronically Signed   By: Jacqulynn Cadet M.D.   On: 06/02/2020 09:32    ____________________________________________    PROCEDURES  Procedure(s) performed:    Procedures    Medications  sodium chloride flush (NS) 0.9 % injection 3 mL (3 mLs Intravenous Not Given 06/02/20 1617)  furosemide (LASIX) injection 40 mg (40 mg Intravenous Given 06/02/20 1618)  potassium chloride SA (KLOR-CON) CR tablet 20 mEq (20 mEq Oral Given 06/02/20 1648)     ____________________________________________   INITIAL IMPRESSION / ASSESSMENT AND PLAN / ED COURSE  Pertinent labs & imaging results that were available during my care of the patient were reviewed by me and considered in my medical decision making (see chart for details).  Review of the [place] CSRS was performed in accordance of the [place] prior to dispensing any controlled drugs.           Patient's diagnosis is consistent with CHF, shortness of breath.  Patient presented to emergency department for increasing shortness of breath over the last week.  On arrival, patient was tachypneic but maintaining his oxygen saturations.  Patient states that he has CHF, has taken twice his daily dose of Lasix yesterday.  Patient states that he does have intermittent flares with his CHF, has been seen in the CHF clinic several times but the last time was several months  ago.  Overall patient appeared well.  He was maintaining good saturation here in the emergency department.  Patient was in no respiratory distress on my presentation to the room.  No significant adventitious lung sounds.  Patient did have an elevated BNP which appears to be the patient's baseline as well as some findings on chest x-ray  consistent with mild CHF.  Patient was ambulated here in the emergency department with no desaturation.  At this time we have given patient IV Lasix.  He states that his breathing feels better than it was yesterday after the IV Lasix.  I will have the patient take an additional dose of Lasix of 40 mg each day.  Patient is already on potassium due to his Lasix use.  I will increase an additional 20 mg as patient is at 3.6 with his potassium while taking potassium and I have increased his Lasix.  Patient was to follow-up with the CHF clinic, however he states that he already has a scheduled appointment with his cardiologist day after tomorrow.  Patient is to keep this appointment.  I have discussed at length with the patient and his family that if he has any worsening of his symptoms he should return to the emergency department.  This includes increased shortness of breath, chest tightness, chest pain.  Patient and family verbalized understanding of same.  They will follow-up with his cardiologist or return to the emergency department if symptoms worsen.  Patient is given ED precautions to return to the ED for any worsening or new symptoms.     ____________________________________________  FINAL CLINICAL IMPRESSION(S) / ED DIAGNOSES  Final diagnoses:  Acute on chronic systolic congestive heart failure (HCC)      NEW MEDICATIONS STARTED DURING THIS VISIT:  ED Discharge Orders         Ordered    potassium chloride (KLOR-CON) 10 MEQ tablet  Daily     Discontinue  Reprint     06/02/20 1700              This chart was dictated using voice recognition software/Dragon. Despite best efforts to proofread, errors can occur which can change the meaning. Any change was purely unintentional.    Darletta Moll, PA-C 06/02/20 1701    Nance Pear, MD 06/02/20 1705

## 2020-06-04 DIAGNOSIS — J438 Other emphysema: Secondary | ICD-10-CM | POA: Diagnosis not present

## 2020-06-04 DIAGNOSIS — I152 Hypertension secondary to endocrine disorders: Secondary | ICD-10-CM | POA: Diagnosis not present

## 2020-06-04 DIAGNOSIS — I25810 Atherosclerosis of coronary artery bypass graft(s) without angina pectoris: Secondary | ICD-10-CM | POA: Diagnosis not present

## 2020-06-04 DIAGNOSIS — E1159 Type 2 diabetes mellitus with other circulatory complications: Secondary | ICD-10-CM | POA: Diagnosis not present

## 2020-06-04 DIAGNOSIS — I350 Nonrheumatic aortic (valve) stenosis: Secondary | ICD-10-CM | POA: Diagnosis not present

## 2020-06-04 DIAGNOSIS — I5021 Acute systolic (congestive) heart failure: Secondary | ICD-10-CM | POA: Diagnosis not present

## 2020-06-04 DIAGNOSIS — I5022 Chronic systolic (congestive) heart failure: Secondary | ICD-10-CM | POA: Diagnosis not present

## 2020-06-21 ENCOUNTER — Encounter: Payer: Self-pay | Admitting: Family

## 2020-06-21 ENCOUNTER — Other Ambulatory Visit: Payer: Self-pay

## 2020-06-21 ENCOUNTER — Ambulatory Visit: Payer: PPO | Attending: Family | Admitting: Family

## 2020-06-21 VITALS — BP 146/44 | HR 31 | Resp 16 | Ht 66.0 in | Wt 176.2 lb

## 2020-06-21 DIAGNOSIS — I110 Hypertensive heart disease with heart failure: Secondary | ICD-10-CM | POA: Insufficient documentation

## 2020-06-21 DIAGNOSIS — I480 Paroxysmal atrial fibrillation: Secondary | ICD-10-CM

## 2020-06-21 DIAGNOSIS — F17210 Nicotine dependence, cigarettes, uncomplicated: Secondary | ICD-10-CM | POA: Diagnosis not present

## 2020-06-21 DIAGNOSIS — Z79899 Other long term (current) drug therapy: Secondary | ICD-10-CM | POA: Diagnosis not present

## 2020-06-21 DIAGNOSIS — I4891 Unspecified atrial fibrillation: Secondary | ICD-10-CM | POA: Diagnosis not present

## 2020-06-21 DIAGNOSIS — Z85828 Personal history of other malignant neoplasm of skin: Secondary | ICD-10-CM | POA: Insufficient documentation

## 2020-06-21 DIAGNOSIS — Z8546 Personal history of malignant neoplasm of prostate: Secondary | ICD-10-CM | POA: Insufficient documentation

## 2020-06-21 DIAGNOSIS — E1151 Type 2 diabetes mellitus with diabetic peripheral angiopathy without gangrene: Secondary | ICD-10-CM | POA: Insufficient documentation

## 2020-06-21 DIAGNOSIS — I5022 Chronic systolic (congestive) heart failure: Secondary | ICD-10-CM | POA: Insufficient documentation

## 2020-06-21 DIAGNOSIS — E785 Hyperlipidemia, unspecified: Secondary | ICD-10-CM | POA: Diagnosis not present

## 2020-06-21 DIAGNOSIS — Z7901 Long term (current) use of anticoagulants: Secondary | ICD-10-CM | POA: Diagnosis not present

## 2020-06-21 DIAGNOSIS — Z7982 Long term (current) use of aspirin: Secondary | ICD-10-CM | POA: Diagnosis not present

## 2020-06-21 DIAGNOSIS — Z8249 Family history of ischemic heart disease and other diseases of the circulatory system: Secondary | ICD-10-CM | POA: Insufficient documentation

## 2020-06-21 DIAGNOSIS — K219 Gastro-esophageal reflux disease without esophagitis: Secondary | ICD-10-CM | POA: Diagnosis not present

## 2020-06-21 DIAGNOSIS — Z8719 Personal history of other diseases of the digestive system: Secondary | ICD-10-CM | POA: Diagnosis not present

## 2020-06-21 DIAGNOSIS — Z8673 Personal history of transient ischemic attack (TIA), and cerebral infarction without residual deficits: Secondary | ICD-10-CM | POA: Diagnosis not present

## 2020-06-21 DIAGNOSIS — J449 Chronic obstructive pulmonary disease, unspecified: Secondary | ICD-10-CM | POA: Diagnosis not present

## 2020-06-21 DIAGNOSIS — Z951 Presence of aortocoronary bypass graft: Secondary | ICD-10-CM | POA: Insufficient documentation

## 2020-06-21 DIAGNOSIS — Z720 Tobacco use: Secondary | ICD-10-CM

## 2020-06-21 DIAGNOSIS — I2510 Atherosclerotic heart disease of native coronary artery without angina pectoris: Secondary | ICD-10-CM | POA: Diagnosis not present

## 2020-06-21 DIAGNOSIS — Z794 Long term (current) use of insulin: Secondary | ICD-10-CM | POA: Insufficient documentation

## 2020-06-21 DIAGNOSIS — I10 Essential (primary) hypertension: Secondary | ICD-10-CM

## 2020-06-21 NOTE — Patient Instructions (Signed)
Continue weighing daily and call for an overnight weight gain of > 2 pounds or a weekly weight gain of >5 pounds. 

## 2020-06-21 NOTE — Progress Notes (Signed)
Patient ID: [name], male    DOB: 07-05-1946, 74 y.o.   MRN: [id number]  HPI  [name] is a 74 y/o male with a history of CAD, DM, hyperlipidemia, HTN, stroke, COPD, GERD, PVD, atrial fibrillation, current tobacco use and chronic heart failure.   Echo report from 12/26/19 reviewed and showed an EF of 35-40% along with mild Barry and mild mildly elevated PA pressure.   Was in the ED 06/02/20 due to acute on chronic HF. IV lasix given. Oxygenating well and he was released with cardiology f/u. Admitted 12/26/19 due to acute heart failure and HTN. Cardiology consult obtained. Elevated troponin thought to be due to demand ischemia. Discharged after 3 days.   He presents today for a follow-up visit with a chief complaint of moderate shortness of breath upon minimal exertion. He describes this as chronic in nature having been present for several years. He has associated fatigue, head congestion and palpitations along with this. He denies any difficulty sleeping (with melatonin), dizziness, abdominal distention, palpitations, pedal edema, chest pain, cough or weight gain.   Currently taking furosemide 40mg  TID but occasionally has to take an additional 20mg .   Past Medical History:  Diagnosis Date  . Arrhythmia    atrial fibrillation  . Basal cell carcinoma 05/2019   right nasal ala, Tx: EDC  . CHF (congestive heart failure) ([place])   . COPD (chronic obstructive pulmonary disease) ([place])   . Coronary artery disease   . Diabetes mellitus without complication ([place])   . GERD (gastroesophageal reflux disease)   . HLD (hyperlipidemia)   . Hypertension   . Peripheral vascular disease ([place])   . Pneumonia   . Prostate cancer ([place])   . Stroke [facility])    Past Surgical History:  Procedure Laterality Date  . COLONOSCOPY    . CORONARY ARTERY BYPASS GRAFT  2006  . EYE SURGERY    . GOLD SEED IMPLANT N/A 12/30/2016   Procedure: GOLD SEED IMPLANT  x3;  Surgeon: Hollice Espy, MD;  Location: [facility] ORS;  Service:  Urology;  Laterality: N/A;  . HEMORRHOID SURGERY N/A 10/02/2016   Procedure: PROCTOSCOPY AND CONTROL OF RECTAL BLEEDING;  Surgeon: Excell Seltzer, MD;  Location: [facility] ORS;  Service: General;  Laterality: N/A;  . PROSTATE BIOPSY    . TONSILLECTOMY     Family History  Problem Relation Age of Onset  . Lung cancer Mother   . Heart attack Father   . Hypertension Other   . Cancer Maternal Grandfather        prostate  . Cancer Paternal Grandmother        breast   Social History   Tobacco Use  . Smoking status: Current Every Day Smoker    Packs/day: 1.00    Years: 54.00    Pack years: 54.00    Types: Cigarettes  . Smokeless tobacco: Never Used  Substance Use Topics  . Alcohol use: No    Alcohol/week: 0.0 standard drinks   No Known Allergies  Past Medical History:  Diagnosis Date  . Arrhythmia    atrial fibrillation  . Basal cell carcinoma 05/2019   right nasal ala, Tx: EDC  . CHF (congestive heart failure) ([place])   . COPD (chronic obstructive pulmonary disease) ([place])   . Coronary artery disease   . Diabetes mellitus without complication ([place])   . GERD (gastroesophageal reflux disease)   . HLD (hyperlipidemia)   . Hypertension   . Peripheral vascular disease ([place])   . Pneumonia   .  Prostate cancer ([place])   . Stroke [facility])    Past Surgical History:  Procedure Laterality Date  . COLONOSCOPY    . CORONARY ARTERY BYPASS GRAFT  2006  . EYE SURGERY    . GOLD SEED IMPLANT N/A 12/30/2016   Procedure: GOLD SEED IMPLANT  x3;  Surgeon: Hollice Espy, MD;  Location: [facility] ORS;  Service: Urology;  Laterality: N/A;  . HEMORRHOID SURGERY N/A 10/02/2016   Procedure: PROCTOSCOPY AND CONTROL OF RECTAL BLEEDING;  Surgeon: Excell Seltzer, MD;  Location: [facility] ORS;  Service: General;  Laterality: N/A;  . PROSTATE BIOPSY    . TONSILLECTOMY     Family History  Problem Relation Age of Onset  . Lung cancer Mother   . Heart attack Father   . Hypertension Other   . Cancer Maternal  Grandfather        prostate  . Cancer Paternal Grandmother        breast   Social History   Tobacco Use  . Smoking status: Current Every Day Smoker    Packs/day: 1.00    Years: 54.00    Pack years: 54.00    Types: Cigarettes  . Smokeless tobacco: Never Used  Substance Use Topics  . Alcohol use: No    Alcohol/week: 0.0 standard drinks   No Known Allergies  Prior to Admission medications   Medication Sig Start Date End Date Taking? Authorizing Provider  acetaminophen (TYLENOL) 325 MG tablet Take 2 tablets (650 mg total) by mouth every 6 (six) hours as needed for mild pain (or Fever >/= 101). 01/20/16  Yes Gouru, Illene Silver, MD  albuterol (PROVENTIL HFA;VENTOLIN HFA) 108 (90 Base) MCG/ACT inhaler Inhale 2 puffs into the lungs every 6 (six) hours as needed for wheezing or shortness of breath.   Yes [provider]  amLODipine (NORVASC) 5 MG tablet Take 1 tablet (5 mg total) by mouth daily. 12/30/19  Yes Ezekiel Slocumb, DO  apixaban (ELIQUIS) 5 MG TABS tablet Take 1 tablet (5 mg total) by mouth 2 (two) times daily. 03/12/20  Yes Rolland Steinert A, FNP  Apoaequorin (PREVAGEN PO) Take by mouth.   Yes [provider]  aspirin EC 81 MG tablet Take 81 mg by mouth daily.   Yes [provider]  bifidobacterium infantis (ALIGN) capsule Take 1 capsule by mouth every morning.   Yes [provider]  cholecalciferol (VITAMIN D) 1000 units tablet Take 1,000 Units by mouth daily.   Yes [provider]  Cinnamon 500 MG capsule Take 1,000 mg by mouth 2 (two) times daily.   Yes [provider]  CRANBERRY EXTRACT PO Take 500 mg by mouth every morning.    Yes [provider]  Ensure Max Protein (ENSURE MAX PROTEIN) LIQD Take 330 mLs (11 oz total) by mouth 2 (two) times daily between meals. 12/29/19  Yes Nicole Kindred A, DO  furosemide (LASIX) 20 MG tablet Take 3 tablets (60 mg total) by mouth 2 (two) times daily. Patient taking differently: Take 40  mg by mouth in the morning, at noon, and at bedtime.  12/29/19  Yes Nicole Kindred A, DO  gabapentin (NEURONTIN) 100 MG capsule Take 100 mg by mouth 2 (two) times daily.   Yes [provider]  glucose 4 GM chewable tablet Chew 1 tablet by mouth as needed for low blood sugar.   Yes [provider]  hydrALAZINE (APRESOLINE) 50 MG tablet Take 1 tablet (50 mg total) by mouth every 8 (eight) hours. 10/15/19  Yes  Ghimire, Henreitta Leber, MD  insulin aspart (NOVOLOG) 100 UNIT/ML injection 0-9 Units, Subcutaneous, 3 times daily with meals CBG < 70: Implement Hypoglycemia protocol/measures CBG 70 - 120: 0 units CBG 121 - 150: 1 unit CBG 151 - 200: 2 units CBG 201 - 250: 3 units CBG 251 - 300: 5 units CBG 301 - 350: 7 units CBG 351 - 400: 9 units CBG > 400: call MD 10/15/19  Yes Ghimire, Henreitta Leber, MD  Ipratropium-Albuterol (COMBIVENT) 20-100 MCG/ACT AERS respimat Inhale 2 puffs into the lungs 4 (four) times daily as needed. 09/08/19 09/07/20 Yes [provider]  isosorbide mononitrate (IMDUR) 30 MG 24 hr tablet Take 1 tablet (30 mg total) by mouth daily. 10/15/19  Yes Ghimire, Henreitta Leber, MD  LANTUS SOLOSTAR 100 UNIT/ML Solostar Pen Inject 45 Units into the skin daily at 10 pm. Patient taking differently: Inject 47 Units into the skin daily at 10 pm.  10/15/19  Yes Ghimire, Henreitta Leber, MD  Magnesium 250 MG TABS Take 250 mg by mouth daily.   Yes [provider]  Melatonin 5 MG CAPS Take by mouth at bedtime.   Yes [provider]  metoprolol tartrate (LOPRESSOR) 25 MG tablet Take 0.5 tablets (12.5 mg total) by mouth 2 (two) times daily. 12/29/19  Yes Ezekiel Slocumb, DO  Misc Natural Products (TOTAL MEMORY & FOCUS FORMULA PO) Take 1 capsule by mouth See admin instructions. Take 2 capsule at 9:00 AM, 1 capsule at 5:00 PM, and 1 capsule at bedtime.   Yes [provider]  Multiple Vitamin (MULTIVITAMIN WITH MINERALS) TABS tablet Take 1 tablet by mouth daily.    Yes [provider]  omeprazole (PRILOSEC) 20 MG capsule Take 20 mg by mouth daily.    Yes [provider]  potassium chloride (K-DUR) 10 MEQ tablet Take 10 mEq by mouth daily.   Yes [provider]  simvastatin (ZOCOR) 20 MG tablet Take 20 mg by mouth at bedtime.   Yes [provider]  SUPER B COMPLEX/C PO Take 1 tablet by mouth every morning.   Yes [provider]  vitamin C (ASCORBIC ACID) 500 MG tablet Take 500 mg by mouth 2 (two) times daily.    Yes [provider]    Review of Systems  Constitutional: Positive for fatigue. Negative for appetite change.  HENT: Positive for congestion. Negative for postnasal drip and sore throat.   Eyes: Negative.   Respiratory: Positive for shortness of breath (easily). Negative for cough and chest tightness.   Cardiovascular: Positive for palpitations. Negative for chest pain and leg swelling.  Gastrointestinal: Negative for abdominal distention and abdominal pain.  Endocrine: Negative.   Genitourinary: Negative.   Musculoskeletal: Negative for back pain and neck pain.  Skin: Negative.   Allergic/Immunologic: Negative.   Neurological: Negative.  Negative for dizziness and light-headedness.  Hematological: Negative for adenopathy. Bruises/bleeds easily.  Psychiatric/Behavioral: Negative for dysphoric mood and sleep disturbance. The patient is not nervous/anxious.    Vitals:   06/21/20 1130  BP: (!) 146/44  Pulse: (!) 31  Resp: 16  SpO2: 97%  Weight: 176 lb 4 oz (79.9 kg)  Height: 5\' 6"  (1.676 m)   Wt Readings from Last 3 Encounters:  06/21/20 176 lb 4 oz (79.9 kg)  06/02/20 173 lb (78.5 kg)  03/08/20 173 lb 3.2 oz (78.6 kg)   Lab Results  Component Value Date   CREATININE 1.88 (H) 06/02/2020   CREATININE 1.99 (H) 12/29/2019   CREATININE 1.67 (H)  12/28/2019    Physical Exam Vitals and nursing note reviewed.  Constitutional:      Appearance: Normal appearance.  HENT:     Head:  Normocephalic and atraumatic.  Cardiovascular:     Rate and Rhythm: Normal rate. Rhythm irregular.  Pulmonary:     Effort: Pulmonary effort is normal.     Breath sounds: No wheezing or rales.  Abdominal:     General: Abdomen is flat. There is no distension.     Palpations: Abdomen is soft.  Musculoskeletal:        General: No tenderness.     Cervical back: Normal range of motion and neck supple.     Right lower leg: No edema.     Left lower leg: No edema.  Skin:    General: Skin is warm and dry.  Neurological:     General: No focal deficit present.     Mental Status: He is alert and oriented to person, place, and time.  Psychiatric:        Mood and Affect: Mood normal.        Behavior: Behavior normal.        Thought Content: Thought content normal.     Assessment & Plan:  1: Chronic heart failure with reduced ejection fraction- - NYHA class III - euvolemic today - weighing daily; reminded to call for an overnight weight gain of >2 pounds or a weekly weight gain of >5 pounds - weight up 1.4 pounds from last visit here 3 months ago - does add "some" salt but says that he's using less; admits that he "really likes salt" but understand the importance of following a low sodium diet  - saw cardiology (Fath) 06/04/20 & diuretic being adjusted; currently taking furosemide 40mg  TID with occasional extra 20mg ; returns to see him 07/02/20 - may need to change diuretic to torsemide if he's not responding to furosemide; instructed him to write down when he takes the extra so he can keep track with how often he needs extra - BNP 06/02/20 was 1179.3 - currently not planning to get COVID vaccines  2: HTN- - BP looks good today  - saw PCP Baldemar Lenis) 04/09/20 - BMP 06/04/20 reviewed and showed sodium 136, potassium 3.9, creatinine 2.0 and GFR 33  3: DM- - A1c 04/03/20 was 10.2% - fasting glucose at home this morning was 316  4: Tobacco use- - smokes ~ 1 ppd of cigarettes - doesn't have a huge  desire to quit smoking - complete cessation discussed for 3 minutes with him  5: Atrial fibrillation- - HR irregular today - taking apixaban and metoprolol   Medication bottles reviewed  Return in 3 months or sooner for any questions/problems before then.

## 2020-07-02 DIAGNOSIS — E1159 Type 2 diabetes mellitus with other circulatory complications: Secondary | ICD-10-CM | POA: Diagnosis not present

## 2020-07-02 DIAGNOSIS — I25810 Atherosclerosis of coronary artery bypass graft(s) without angina pectoris: Secondary | ICD-10-CM | POA: Diagnosis not present

## 2020-07-02 DIAGNOSIS — I5022 Chronic systolic (congestive) heart failure: Secondary | ICD-10-CM | POA: Diagnosis not present

## 2020-07-02 DIAGNOSIS — I5021 Acute systolic (congestive) heart failure: Secondary | ICD-10-CM | POA: Diagnosis not present

## 2020-07-02 DIAGNOSIS — I152 Hypertension secondary to endocrine disorders: Secondary | ICD-10-CM | POA: Diagnosis not present

## 2020-07-18 ENCOUNTER — Ambulatory Visit: Payer: PPO | Admitting: Dermatology

## 2020-07-18 ENCOUNTER — Other Ambulatory Visit: Payer: Self-pay

## 2020-07-18 DIAGNOSIS — Z1283 Encounter for screening for malignant neoplasm of skin: Secondary | ICD-10-CM | POA: Diagnosis not present

## 2020-07-18 DIAGNOSIS — L570 Actinic keratosis: Secondary | ICD-10-CM

## 2020-07-18 DIAGNOSIS — Z85828 Personal history of other malignant neoplasm of skin: Secondary | ICD-10-CM

## 2020-07-18 DIAGNOSIS — D692 Other nonthrombocytopenic purpura: Secondary | ICD-10-CM | POA: Diagnosis not present

## 2020-07-18 DIAGNOSIS — L309 Dermatitis, unspecified: Secondary | ICD-10-CM

## 2020-07-18 DIAGNOSIS — D1800 Hemangioma unspecified site: Secondary | ICD-10-CM | POA: Diagnosis not present

## 2020-07-18 DIAGNOSIS — E119 Type 2 diabetes mellitus without complications: Secondary | ICD-10-CM

## 2020-07-18 DIAGNOSIS — L821 Other seborrheic keratosis: Secondary | ICD-10-CM | POA: Diagnosis not present

## 2020-07-18 DIAGNOSIS — D225 Melanocytic nevi of trunk: Secondary | ICD-10-CM

## 2020-07-18 DIAGNOSIS — L814 Other melanin hyperpigmentation: Secondary | ICD-10-CM | POA: Diagnosis not present

## 2020-07-18 DIAGNOSIS — L853 Xerosis cutis: Secondary | ICD-10-CM

## 2020-07-18 DIAGNOSIS — B353 Tinea pedis: Secondary | ICD-10-CM | POA: Diagnosis not present

## 2020-07-18 DIAGNOSIS — D229 Melanocytic nevi, unspecified: Secondary | ICD-10-CM

## 2020-07-18 MED ORDER — CLOBETASOL PROP EMOLLIENT BASE 0.05 % EX CREA: TOPICAL_CREAM | CUTANEOUS | 0 refills | Status: DC

## 2020-07-18 MED ORDER — KETOCONAZOLE 2 % EX CREA: TOPICAL_CREAM | CUTANEOUS | 3 refills | Status: DC

## 2020-07-18 NOTE — Progress Notes (Signed)
Follow-Up Visit   Subjective  [name] is a 74 y.o. male who presents for the following: Annual Exam (UBSE - Hx of BCC; patient has noticed a new dark spot on his R hand that he would like checked today ).  His home nurse wants his feet checked today.  The following portions of the chart were reviewed this encounter and updated as appropriate:     Review of Systems:  No other skin or systemic complaints except as noted in HPI or Assessment and Plan.  Objective  Well appearing patient in no apparent distress; mood and affect are within normal limits.  A focused examination was performed including the upper body and feet. Relevant physical exam findings are noted in the Assessment and Plan.  Objective  L mid forearm x 6, L elbow x 1, L post ear helix x 2, R shoulder x 1, occipital scalp x 1, nasal dorsum x 1, R upper chest x 1, L hand dorsum x 2, R forearm x 1 (16): Keratotic papules   Objective  Right Upper Arm: Pink lichenified patch on the right upper arm   Objective  Left Upper Back, R lat chest: 0.4 x 0.3 cm medium dark brown macule slightly waxy Flesh/brown papule on the L upper back  Objective  the feet and lower legs: Thick hyperkeratosis and ichthyotic scale dorsal>plantar, web spaces with scale and maceration. Nail dystrophy- thickened long toenails Pt is diabetic   Images      Assessment & Plan  AK (actinic keratosis) (16) L mid forearm x 6, L elbow x 1, L post ear helix x 2, R shoulder x 1, occipital scalp x 1, nasal dorsum x 1, R upper chest x 1, L hand dorsum x 2, R forearm x 1  Hypertrophic  Destruction of lesion - L mid forearm x 6, L elbow x 1, L post ear helix x 2, R shoulder x 1, occipital scalp x 1, nasal dorsum x 1, R upper chest x 1, L hand dorsum x 2, R forearm x 1  Destruction method: cryotherapy   Informed consent: discussed and consent obtained   Lesion destroyed using liquid nitrogen: Yes   Region frozen until ice ball extended beyond  lesion: Yes   Outcome: patient tolerated procedure well with no complications   Post-procedure details: wound care instructions given    Dermatitis Right Upper Arm  With [facility] Start Clobetasol cream apply to aa's BID until rash cleared. 60g 0RF. If not clear after one month RTC. Avoid scratching/rubbing area  Clobetasol Prop Emollient Base (CLOBETASOL PROPIONATE E) 0.05 % emollient cream - Right Upper Arm  Nevus (2) R lat chest; Left Upper Back  Benign-appearing.  Observation.  Call clinic for new or changing lesions.  Recommend daily use of broad spectrum spf 30+ sunscreen to sun-exposed areas.     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 Then apply Ketoconazole 2% cream BID including web spaces. Then apply Amlactin 12% moisturizing cream twice daily to feet and legs (or Eucerin Roughness Relief Spot treatment, or Gold Bond Rough and Bumpy moisturizing cream).   Recommend patient follow up with his podiatrist to have his toenails cut.   He has visit with PCP next month, and reminded patient to have his feet examined at  that time since he is diabetic.  Discussed the increased risk of infection/ulceration/amputation of toes/feet associated with poorly controlled DM  ketoconazole (NIZORAL) 2 % cream - the feet and lower legs   Seborrheic Keratoses - R hand dorsum, L temple face, trunk, extremities - Stuck-on, waxy, tan-brown papules and plaques  - Discussed benign etiology and prognosis. - Observe - Call for any changes  History of Basal Cell Carcinoma of the Skin - R nasal ala 05/30/19 ED&C - No evidence of recurrence today - Recommend regular full body skin exams - Recommend daily broad spectrum sunscreen SPF 30+ to sun-exposed areas, reapply every 2 hours as needed.  - Call  if any new or changing lesions are noted between office visits  Purpura - arms - Violaceous macules and patches - Benign - Related to age, sun damage and/or use of blood thinners - Observe - Can use OTC arnica containing moisturizer such as Dermend Bruise Formula if desired - Call for worsening or other concerns  Lentigines - back - Scattered tan macules - Discussed due to sun exposure - Benign, observe - Call for any changes  Hemangiomas - trunk, extremities  - Red papules - Discussed benign nature - Observe - Call for any changes  Return in about 4 weeks (around 08/15/2020) for to 6 week for rash and feet recheck.  Luther Redo, CMA, am acting as scribe for Brendolyn Patty, MD .  Documentation: I have reviewed the above documentation for accuracy and completeness, and I agree with the above.  Brendolyn Patty MD

## 2020-07-26 DIAGNOSIS — Z794 Long term (current) use of insulin: Secondary | ICD-10-CM | POA: Diagnosis not present

## 2020-07-26 DIAGNOSIS — E1140 Type 2 diabetes mellitus with diabetic neuropathy, unspecified: Secondary | ICD-10-CM | POA: Diagnosis not present

## 2020-07-26 DIAGNOSIS — L03032 Cellulitis of left toe: Secondary | ICD-10-CM | POA: Diagnosis not present

## 2020-07-26 DIAGNOSIS — B351 Tinea unguium: Secondary | ICD-10-CM | POA: Diagnosis not present

## 2020-07-26 DIAGNOSIS — L851 Acquired keratosis [keratoderma] palmaris et plantaris: Secondary | ICD-10-CM | POA: Diagnosis not present

## 2020-07-30 ENCOUNTER — Other Ambulatory Visit
Admission: RE | Admit: 2020-07-30 | Discharge: 2020-07-30 | Disposition: A | Discharge status: Home / Self Care | Payer: PPO | Source: Ambulatory Visit | Attending: Cardiology | Admitting: Cardiology

## 2020-07-30 DIAGNOSIS — I639 Cerebral infarction, unspecified: Secondary | ICD-10-CM | POA: Diagnosis not present

## 2020-07-30 DIAGNOSIS — Z794 Long term (current) use of insulin: Secondary | ICD-10-CM | POA: Diagnosis not present

## 2020-07-30 DIAGNOSIS — I152 Hypertension secondary to endocrine disorders: Secondary | ICD-10-CM | POA: Diagnosis not present

## 2020-07-30 DIAGNOSIS — E119 Type 2 diabetes mellitus without complications: Secondary | ICD-10-CM | POA: Diagnosis not present

## 2020-07-30 DIAGNOSIS — I25810 Atherosclerosis of coronary artery bypass graft(s) without angina pectoris: Secondary | ICD-10-CM | POA: Diagnosis not present

## 2020-07-30 DIAGNOSIS — E1159 Type 2 diabetes mellitus with other circulatory complications: Secondary | ICD-10-CM | POA: Diagnosis not present

## 2020-07-30 DIAGNOSIS — I5022 Chronic systolic (congestive) heart failure: Secondary | ICD-10-CM | POA: Diagnosis not present

## 2020-07-30 DIAGNOSIS — I350 Nonrheumatic aortic (valve) stenosis: Secondary | ICD-10-CM | POA: Diagnosis not present

## 2020-07-30 DIAGNOSIS — J438 Other emphysema: Secondary | ICD-10-CM | POA: Diagnosis not present

## 2020-07-30 LAB — BRAIN NATRIURETIC PEPTIDE: B Natriuretic Peptide: 1048.1 pg/mL — ABNORMAL HIGH (ref 0.0–100.0)

## 2020-08-06 DIAGNOSIS — Z23 Encounter for immunization: Secondary | ICD-10-CM | POA: Diagnosis not present

## 2020-08-15 ENCOUNTER — Ambulatory Visit
Admission: RE | Admit: 2020-08-15 | Discharge: 2020-08-15 | Disposition: A | Discharge status: Home / Self Care | Payer: PPO | Source: Ambulatory Visit | Attending: Cardiology | Admitting: Cardiology

## 2020-08-15 ENCOUNTER — Other Ambulatory Visit: Payer: Self-pay

## 2020-08-15 ENCOUNTER — Other Ambulatory Visit: Payer: Self-pay | Admitting: Cardiology

## 2020-08-15 DIAGNOSIS — I25810 Atherosclerosis of coronary artery bypass graft(s) without angina pectoris: Secondary | ICD-10-CM | POA: Diagnosis not present

## 2020-08-15 DIAGNOSIS — I350 Nonrheumatic aortic (valve) stenosis: Secondary | ICD-10-CM | POA: Diagnosis not present

## 2020-08-15 DIAGNOSIS — I5022 Chronic systolic (congestive) heart failure: Secondary | ICD-10-CM | POA: Diagnosis not present

## 2020-08-15 DIAGNOSIS — I152 Hypertension secondary to endocrine disorders: Secondary | ICD-10-CM | POA: Diagnosis not present

## 2020-08-15 DIAGNOSIS — I639 Cerebral infarction, unspecified: Secondary | ICD-10-CM | POA: Diagnosis not present

## 2020-08-15 DIAGNOSIS — J438 Other emphysema: Secondary | ICD-10-CM | POA: Diagnosis not present

## 2020-08-15 DIAGNOSIS — E1159 Type 2 diabetes mellitus with other circulatory complications: Secondary | ICD-10-CM | POA: Diagnosis not present

## 2020-08-15 MED ORDER — POTASSIUM CHLORIDE CRYS ER 20 MEQ PO TBCR
EXTENDED_RELEASE_TABLET | ORAL | Status: AC
  Administered 2020-08-15: 40 meq via ORAL
  Filled 2020-08-15: qty 2

## 2020-08-15 MED ORDER — POTASSIUM CHLORIDE CRYS ER 20 MEQ PO TBCR: 40.0000 meq | EXTENDED_RELEASE_TABLET | Freq: Once | ORAL | Status: AC

## 2020-08-15 MED ORDER — FUROSEMIDE 10 MG/ML IJ SOLN: 80.0000 mg | Freq: Once | INTRAMUSCULAR | Status: DC

## 2020-08-15 MED ORDER — FUROSEMIDE 10 MG/ML IJ SOLN
INTRAMUSCULAR | Status: AC
  Filled 2020-08-15: qty 4

## 2020-08-15 MED ORDER — FUROSEMIDE 10 MG/ML IJ SOLN
INTRAMUSCULAR | Status: AC
  Filled 2020-08-15: qty 8

## 2020-08-15 MED ORDER — POTASSIUM CHLORIDE ER 10 MEQ PO TBCR: 40.0000 meq | EXTENDED_RELEASE_TABLET | Freq: Once | ORAL | Status: DC

## 2020-08-15 MED ORDER — FUROSEMIDE 10 MG/ML IJ SOLN
80.0000 mg | Freq: Once | INTRAMUSCULAR | Status: AC
  Administered 2020-08-15: 80 mg via INTRAVENOUS

## 2020-08-20 DIAGNOSIS — I5022 Chronic systolic (congestive) heart failure: Secondary | ICD-10-CM | POA: Diagnosis not present

## 2020-08-20 DIAGNOSIS — Z125 Encounter for screening for malignant neoplasm of prostate: Secondary | ICD-10-CM | POA: Diagnosis not present

## 2020-08-20 DIAGNOSIS — N1832 Chronic kidney disease, stage 3b: Secondary | ICD-10-CM | POA: Diagnosis not present

## 2020-08-20 DIAGNOSIS — Z79899 Other long term (current) drug therapy: Secondary | ICD-10-CM | POA: Diagnosis not present

## 2020-08-20 DIAGNOSIS — J449 Chronic obstructive pulmonary disease, unspecified: Secondary | ICD-10-CM | POA: Diagnosis not present

## 2020-08-20 DIAGNOSIS — E785 Hyperlipidemia, unspecified: Secondary | ICD-10-CM | POA: Diagnosis not present

## 2020-08-20 DIAGNOSIS — Z794 Long term (current) use of insulin: Secondary | ICD-10-CM | POA: Diagnosis not present

## 2020-08-20 DIAGNOSIS — E1142 Type 2 diabetes mellitus with diabetic polyneuropathy: Secondary | ICD-10-CM | POA: Diagnosis not present

## 2020-08-20 DIAGNOSIS — E1169 Type 2 diabetes mellitus with other specified complication: Secondary | ICD-10-CM | POA: Diagnosis not present

## 2020-08-21 DIAGNOSIS — H5021 Vertical strabismus, right eye: Secondary | ICD-10-CM | POA: Diagnosis not present

## 2020-08-21 DIAGNOSIS — H35412 Lattice degeneration of retina, left eye: Secondary | ICD-10-CM | POA: Diagnosis not present

## 2020-08-21 DIAGNOSIS — H43811 Vitreous degeneration, right eye: Secondary | ICD-10-CM | POA: Diagnosis not present

## 2020-08-21 DIAGNOSIS — E113293 Type 2 diabetes mellitus with mild nonproliferative diabetic retinopathy without macular edema, bilateral: Secondary | ICD-10-CM | POA: Diagnosis not present

## 2020-08-22 ENCOUNTER — Other Ambulatory Visit: Payer: Self-pay

## 2020-08-22 ENCOUNTER — Ambulatory Visit: Payer: PPO | Admitting: Dermatology

## 2020-08-22 DIAGNOSIS — Q809 Congenital ichthyosis, unspecified: Secondary | ICD-10-CM | POA: Diagnosis not present

## 2020-08-22 DIAGNOSIS — L853 Xerosis cutis: Secondary | ICD-10-CM | POA: Diagnosis not present

## 2020-08-22 DIAGNOSIS — B353 Tinea pedis: Secondary | ICD-10-CM | POA: Diagnosis not present

## 2020-08-22 DIAGNOSIS — L570 Actinic keratosis: Secondary | ICD-10-CM | POA: Diagnosis not present

## 2020-08-22 DIAGNOSIS — L820 Inflamed seborrheic keratosis: Secondary | ICD-10-CM

## 2020-08-22 DIAGNOSIS — E119 Type 2 diabetes mellitus without complications: Secondary | ICD-10-CM | POA: Diagnosis not present

## 2020-08-22 DIAGNOSIS — B351 Tinea unguium: Secondary | ICD-10-CM

## 2020-08-22 DIAGNOSIS — L309 Dermatitis, unspecified: Secondary | ICD-10-CM

## 2020-08-22 NOTE — Patient Instructions (Addendum)
Soak feet in warm water and epsom salt daily for 20 minutes then gently rub off dead skin with washcloth after soaking to help exfoliate. Then apply Ketoconazole 2% cream BID including web spaces. Then apply Amlactin 12% moisturizing cream twice daily to feet, legs, and arms (or Eucerin Roughness Relief Spot treatment, or Gold Bond Rough and Bumpy moisturizing cream).   Cryotherapy Aftercare  . Wash gently with soap and water everyday.   Marland Kitchen Apply Vaseline and Band-Aid daily until healed.

## 2020-08-22 NOTE — Progress Notes (Signed)
   Follow-Up Visit   Subjective  [name] is a 74 y.o. male who presents for the following: Follow-up. Patient has severe xerosis on feet and lower legs. He has not started treatment yet. He saw his podiatrist and had toenails cut and one removed. The podiatrist recommended patient not start treatment until area healed where toenail was removed. Dermatitis with [place] on the right upper arm is not resolved. History of AKs treated last visit, residual spot on scalp.  The following portions of the chart were reviewed this encounter and updated as appropriate:      Review of Systems:  No other skin or systemic complaints except as noted in HPI or Assessment and Plan.  Objective  Well appearing patient in no apparent distress; mood and affect are within normal limits.  A focused examination was performed including face, scalp. Relevant physical exam findings are noted in the Assessment and Plan.  Objective  Feet, lower legs, arms: Ichthyotic scale with erythema of the feet; nails are trimmed; ichthyotic scale on arms  Objective  Arms: Healing sores on arms from prior LN2  Objective  Occipital Scalp x 1, Left Elbow x 1 (2): Residual keratotic papules.  Objective  Right Hand Dorsum x 1, Left Temple x 1 (2): Erythematous keratotic or waxy stuck-on papule or plaque.   Objective  Right Upper Arm: Xerosis, rash is clear   Assessment & Plan  Xerosis cutis Feet, lower legs, arms  Severe with ichthyosis and tinea pedis/unguium, patient is diabetic   Soak feet in warm water and epsom salt daily for 20 minutes then gently rub off dead skin with washcloth after soaking to help exfoliate. Then apply Ketoconazole 2% cream BID including web spaces. Then apply Amlactin 12% moisturizing cream twice daily to feet and legs/arms (or Eucerin Roughness Relief Spot treatment, or Gold Bond Rough and Bumpy moisturizing cream).   Other Related Medications ketoconazole (NIZORAL) 2 %  cream  Actinic keratosis Arms  Healing from LN2 treatment.  Start antibiotic ointment and cover with bandage.  AK (actinic keratosis) (2) Occipital Scalp x 1, Left Elbow x 1  Destruction of lesion - Occipital Scalp x 1, Left Elbow x 1  Destruction method: cryotherapy   Informed consent: discussed and consent obtained   Lesion destroyed using liquid nitrogen: Yes   Region frozen until ice ball extended beyond lesion: Yes   Outcome: patient tolerated procedure well with no complications   Post-procedure details: wound care instructions given    Inflamed seborrheic keratosis (2) Right Hand Dorsum x 1, Left Temple x 1  Destruction of lesion - Right Hand Dorsum x 1, Left Temple x 1  Destruction method: cryotherapy   Informed consent: discussed and consent obtained   Lesion destroyed using liquid nitrogen: Yes   Region frozen until ice ball extended beyond lesion: Yes   Outcome: patient tolerated procedure well with no complications   Post-procedure details: wound care instructions given    Dermatitis Right Upper Arm  Hx Dermatitis with LSC, resolved.  Return in about 4 weeks (around 09/19/2020) for AKs, ISKs, rash on feet.   Documentation: I have reviewed the above documentation for accuracy and completeness, and I agree with the above.  Brendolyn Patty MD

## 2020-09-07 ENCOUNTER — Other Ambulatory Visit: Payer: Self-pay

## 2020-09-07 ENCOUNTER — Ambulatory Visit
Admission: EM | Admit: 2020-09-07 | Discharge: 2020-09-07 | Disposition: A | Discharge status: Home / Self Care | Payer: PPO | Attending: Emergency Medicine | Admitting: Emergency Medicine

## 2020-09-07 DIAGNOSIS — Z20822 Contact with and (suspected) exposure to covid-19: Secondary | ICD-10-CM | POA: Diagnosis not present

## 2020-09-07 NOTE — ED Triage Notes (Signed)
Pt is wanting a COVID test & is denying ALL sxs today.

## 2020-09-07 NOTE — Addendum Note (Signed)
Encounter addended by: Kathyrn Drown, RN on: 09/07/2020 10:17 AM  Actions taken: Charge Capture section accepted

## 2020-09-08 LAB — SARS CORONAVIRUS 2 (TAT 6-24 HRS): SARS Coronavirus 2: NEGATIVE

## 2020-09-12 ENCOUNTER — Ambulatory Visit: Payer: PPO | Admitting: Family

## 2020-09-19 ENCOUNTER — Ambulatory Visit: Payer: PPO | Admitting: Dermatology

## 2020-09-21 NOTE — Progress Notes (Signed)
Patient ID: [name], male    DOB: [birth year], 74 y.o.   MRN: [id number]  HPI  [name] is a 74 y/o male with a history of CAD, DM, hyperlipidemia, HTN, stroke, COPD, GERD, PVD, atrial fibrillation, current tobacco use and chronic heart failure.   Echo report from 12/26/19 reviewed and showed an EF of 35-40% along with mild Barry and mild mildly elevated PA pressure.   Was in the ED 06/02/20 due to HF exacerbation where he was treated with IV lasix and released.   He presents today for a follow-up visit with a chief complaint of moderate shortness of breath upon minimal exertion. He describes this as chronic in nature having been present for several years. He has associated fatigue, head congestion and easy bruising along with this. He denies any difficulty sleeping, dizziness, abdominal distention, palpitations, pedal edema, chest pain, cough or weight gain.   Says that he normally takes 40mg  torsemide BID but 2-3 days/ week, he'll take and extra couple of tablets if he feels more short of breath.   Says that he's planning on eating out at [facility] twice this week to celebrate Thanksgiving with family.   Past Medical History:  Diagnosis Date   Arrhythmia    atrial fibrillation   Basal cell carcinoma 05/2019   right nasal ala, Tx: [facility]   Basal cell carcinoma 04/12/2019   Left posterior ear. Nodular pattern, excoriated.    CHF (congestive heart failure) (HCC)    COPD (chronic obstructive pulmonary disease) (HCC)    Coronary artery disease    Diabetes mellitus without complication (HCC)    GERD (gastroesophageal reflux disease)    HLD (hyperlipidemia)    Hypertension    Peripheral vascular disease (HCC)    Pneumonia    Prostate cancer ([place])    Squamous cell carcinoma of skin 08/10/2018   Left upper arm above elbow. [facility].   Squamous cell carcinoma of skin 09/07/2018   Right forearm, below elbow. [facility]. [facility] 01/10/2019.   Stroke [facility])    Past Surgical History:   Procedure Laterality Date   COLONOSCOPY     CORONARY ARTERY BYPASS GRAFT  2006   EYE SURGERY     GOLD SEED IMPLANT N/A 12/30/2016   Procedure: GOLD SEED IMPLANT  x3;  Surgeon: Hollice Espy, MD;  Location: [facility] ORS;  Service: Urology;  Laterality: N/A;   HEMORRHOID SURGERY N/A 10/02/2016   Procedure: PROCTOSCOPY AND CONTROL OF RECTAL BLEEDING;  Surgeon: Excell Seltzer, MD;  Location: [facility] ORS;  Service: General;  Laterality: N/A;   PROSTATE BIOPSY     TONSILLECTOMY     Family History  Problem Relation Age of Onset   Lung cancer Mother    Heart attack Father    Hypertension Other    Cancer Maternal Grandfather        prostate   Cancer Paternal Grandmother        breast   Social History   Tobacco Use   Smoking status: Current Every Day Smoker    Packs/day: 1.00    Years: 54.00    Pack years: 54.00    Types: Cigarettes   Smokeless tobacco: Never Used  Substance Use Topics   Alcohol use: No    Alcohol/week: 0.0 standard drinks   No Known Allergies  Past Medical History:  Diagnosis Date   Arrhythmia    atrial fibrillation   Basal cell carcinoma 05/2019   right nasal ala, Tx: [facility]   Basal cell carcinoma  04/12/2019   Left posterior ear. Nodular pattern, excoriated.    CHF (congestive heart failure) (HCC)    COPD (chronic obstructive pulmonary disease) (HCC)    Coronary artery disease    Diabetes mellitus without complication (HCC)    GERD (gastroesophageal reflux disease)    HLD (hyperlipidemia)    Hypertension    Peripheral vascular disease (HCC)    Pneumonia    Prostate cancer ([place])    Squamous cell carcinoma of skin 08/10/2018   Left upper arm above elbow. [facility].   Squamous cell carcinoma of skin 09/07/2018   Right forearm, below elbow. [facility]. [facility] 01/10/2019.   Stroke [facility])    Past Surgical History:  Procedure Laterality Date   COLONOSCOPY     CORONARY ARTERY BYPASS GRAFT  2006   EYE SURGERY     GOLD SEED IMPLANT N/A  12/30/2016   Procedure: GOLD SEED IMPLANT  x3;  Surgeon: Hollice Espy, MD;  Location: [facility] ORS;  Service: Urology;  Laterality: N/A;   HEMORRHOID SURGERY N/A 10/02/2016   Procedure: PROCTOSCOPY AND CONTROL OF RECTAL BLEEDING;  Surgeon: Excell Seltzer, MD;  Location: [facility];  Service: General;  Laterality: N/A;   PROSTATE BIOPSY     TONSILLECTOMY     Family History  Problem Relation Age of Onset   Lung cancer Mother    Heart attack Father    Hypertension Other    Cancer Maternal Grandfather        prostate   Cancer Paternal Grandmother        breast   Social History   Tobacco Use   Smoking status: Current Every Day Smoker    Packs/day: 1.00    Years: 54.00    Pack years: 54.00    Types: Cigarettes   Smokeless tobacco: Never Used  Substance Use Topics   Alcohol use: No    Alcohol/week: 0.0 standard drinks   No Known Allergies  Prior to Admission medications   Medication Sig Start Date End Date Taking? Authorizing Provider  acetaminophen (TYLENOL) 325 MG tablet Take 2 tablets (650 mg total) by mouth every 6 (six) hours as needed for mild pain (or Fever >/= 101). 01/20/16  Yes Gouru, Illene Silver, MD  albuterol (PROVENTIL HFA;VENTOLIN HFA) 108 (90 Base) MCG/ACT inhaler Inhale 2 puffs into the lungs every 6 (six) hours as needed for wheezing or shortness of breath.   Yes [provider]  amLODipine (NORVASC) 5 MG tablet Take 1 tablet (5 mg total) by mouth daily. 12/30/19  Yes Ezekiel Slocumb, DO  apixaban (ELIQUIS) 5 MG TABS tablet Take 1 tablet (5 mg total) by mouth 2 (two) times daily. 03/12/20  Yes Kaily Wragg A, FNP  Apoaequorin (PREVAGEN PO) Take by mouth.   Yes [provider]  aspirin EC 81 MG tablet Take 81 mg by mouth daily.   Yes [provider]  bifidobacterium infantis (ALIGN) capsule Take 1 capsule by mouth every morning.   Yes [provider]  cholecalciferol (VITAMIN D) 1000 units tablet Take 1,000 Units by mouth daily.    Yes [provider]  Cinnamon 500 MG capsule Take 1,000 mg by mouth 2 (two) times daily.   Yes [provider]  CRANBERRY EXTRACT PO Take 500 mg by mouth every morning.    Yes [provider]  Ensure Max Protein (ENSURE MAX PROTEIN) LIQD Take 330 mLs (11 oz total) by mouth 2 (two) times daily between meals. 12/29/19  Yes Nicole Kindred A, DO  gabapentin (  NEURONTIN) 100 MG capsule Take 100 mg by mouth 2 (two) times daily.   Yes [provider]  glucose 4 GM chewable tablet Chew 1 tablet by mouth as needed for low blood sugar.   Yes [provider]  hydrALAZINE (APRESOLINE) 50 MG tablet Take 1 tablet (50 mg total) by mouth every 8 (eight) hours. 10/15/19  Yes Ghimire, Henreitta Leber, MD  insulin aspart (NOVOLOG) 100 UNIT/ML injection 0-9 Units, Subcutaneous, 3 times daily with meals CBG < 70: Implement Hypoglycemia protocol/measures CBG 70 - 120: 0 units CBG 121 - 150: 1 unit CBG 151 - 200: 2 units CBG 201 - 250: 3 units CBG 251 - 300: 5 units CBG 301 - 350: 7 units CBG 351 - 400: 9 units CBG > 400: call MD 10/15/19  Yes Ghimire, Henreitta Leber, MD  isosorbide mononitrate (IMDUR) 30 MG 24 hr tablet Take 1 tablet (30 mg total) by mouth daily. 10/15/19  Yes Ghimire, Henreitta Leber, MD  ketoconazole (NIZORAL) 2 % cream Apply to the feet and between the toes BID 07/18/20  Yes Brendolyn Patty, MD  LANTUS SOLOSTAR 100 UNIT/ML Solostar Pen Inject 45 Units into the skin daily at 10 pm. Patient taking differently: Inject 47 Units into the skin daily at 10 pm.  10/15/19  Yes Ghimire, Henreitta Leber, MD  Magnesium 250 MG TABS Take 250 mg by mouth daily.   Yes [provider]  Misc Natural Products (TOTAL MEMORY & FOCUS FORMULA PO) Take 1 capsule by mouth See admin instructions. Take 2 capsule at 9:00 AM, 1 capsule at 5:00 PM, and 1 capsule at bedtime.   Yes [provider]  Multiple Vitamin (MULTIVITAMIN WITH MINERALS) TABS tablet Take 1 tablet by mouth daily.   Yes  [provider]  omeprazole (PRILOSEC) 20 MG capsule Take 20 mg by mouth daily.    Yes [provider]  potassium chloride (K-DUR) 10 MEQ tablet Take 10 mEq by mouth daily.   Yes [provider]  simvastatin (ZOCOR) 20 MG tablet Take 20 mg by mouth at bedtime.   Yes [provider]  SUPER B COMPLEX/C PO Take 1 tablet by mouth every morning.   Yes [provider]  torsemide (DEMADEX) 20 MG tablet Take 40 mg by mouth 2 (two) times daily.   Yes [provider]  traZODone (DESYREL) 50 MG tablet Take 50 mg by mouth at bedtime.   Yes [provider]  vitamin C (ASCORBIC ACID) 500 MG tablet Take 500 mg by mouth 2 (two) times daily.    Yes [provider]  metoprolol tartrate (LOPRESSOR) 25 MG tablet Take 0.5 tablets (12.5 mg total) by mouth 2 (two) times daily. Patient not taking: Reported on 09/24/2020 12/29/19   Ezekiel Slocumb, DO    Review of Systems  Constitutional: Positive for fatigue. Negative for appetite change.  HENT: Positive for congestion. Negative for postnasal drip and sore throat.   Eyes: Negative.   Respiratory: Positive for shortness of breath (easily). Negative for cough and chest tightness.   Cardiovascular: Negative for chest pain, palpitations and leg swelling.  Gastrointestinal: Negative for abdominal distention and abdominal pain.  Endocrine: Negative.   Genitourinary: Negative.   Musculoskeletal: Negative for back pain and neck pain.  Skin: Negative.   Allergic/Immunologic: Negative.   Neurological: Negative.  Negative for dizziness and light-headedness.  Hematological: Negative for adenopathy. Bruises/bleeds easily.  Psychiatric/Behavioral: Negative for dysphoric mood and sleep disturbance (sleeping on 1 pillow). The patient is not nervous/anxious.  Vitals:   09/24/20 1006  BP: (!) 155/50  Pulse: 67  Resp: 18  SpO2: 95%  Weight: 169 lb 2 oz (76.7 kg)  Height: 5\' 6"  (1.676 m)   Wt  Readings from Last 3 Encounters:  09/24/20 169 lb 2 oz (76.7 kg)  06/21/20 176 lb 4 oz (79.9 kg)  06/02/20 173 lb (78.5 kg)   Lab Results  Component Value Date   CREATININE 1.88 (H) 06/02/2020   CREATININE 1.99 (H) 12/29/2019   CREATININE 1.67 (H) 12/28/2019    Physical Exam Vitals and nursing note reviewed.  Constitutional:      Appearance: Normal appearance.  HENT:     Head: Normocephalic and atraumatic.  Cardiovascular:     Rate and Rhythm: Normal rate. Rhythm irregular.  Pulmonary:     Effort: Pulmonary effort is normal.     Breath sounds: No wheezing or rales.  Abdominal:     General: Abdomen is flat. There is no distension.     Palpations: Abdomen is soft.  Musculoskeletal:        General: No tenderness.     Cervical back: Normal range of motion and neck supple.     Right lower leg: No edema.     Left lower leg: No edema.  Skin:    General: Skin is warm and dry.  Neurological:     General: No focal deficit present.     Mental Status: He is alert and oriented to person, place, and time.  Psychiatric:        Mood and Affect: Mood normal.        Behavior: Behavior normal.        Thought Content: Thought content normal.    Assessment & Plan:  1: Chronic heart failure with reduced ejection fraction- - NYHA class III - euvolemic today - weighing daily; reminded to call for an overnight weight gain of >2 pounds or a weekly weight gain of >5 pounds - weight down 7 pounds from last visit here 3 months ago - does add "some" salt but says that he's using less; admits that he "really likes salt" but understand the importance of following a low sodium diet  - tells me that he's planning on eating out at [employer] twice this week to celebrate Thanksgiving with family; emphasized the high sodium content and to be extremely careful with what foods he eats - takes extra diuretic a couple of times/ week and explained that his renal function needed to be closely watched and  that he should f/u with Dr. Ubaldo Glassing to let him know this - saw cardiology (Fath) 08/15/20  - BNP 06/02/20 was 1179.3 - currently not planning to get COVID vaccines - reports receiving his flu vaccine for this season  2: HTN- - BP mildly elevated today - saw PCP Baldemar Lenis) 08/20/20 - BMP 08/20/20 reviewed and showed sodium 137, potassium 5.5, creatinine 2.0 3 and GFR 28  3: DM- - A1c 08/20/20 was 8.9% - fasting glucose in clinic this morning was 75; juice and graham crackers were given to him  4: Tobacco use- - smokes ~ 1 ppd of cigarettes - doesn't have a huge desire to quit smoking - complete cessation discussed for 3 minutes with him   Medication bottles reviewed  Return in 1 year or sooner for any questions/problems before then.

## 2020-09-24 ENCOUNTER — Other Ambulatory Visit: Payer: Self-pay

## 2020-09-24 ENCOUNTER — Encounter: Payer: Self-pay | Admitting: Family

## 2020-09-24 ENCOUNTER — Ambulatory Visit: Payer: PPO | Attending: Family | Admitting: Family

## 2020-09-24 VITALS — BP 155/50 | HR 67 | Resp 18 | Ht 66.0 in | Wt 169.1 lb

## 2020-09-24 DIAGNOSIS — Z794 Long term (current) use of insulin: Secondary | ICD-10-CM | POA: Diagnosis not present

## 2020-09-24 DIAGNOSIS — Z7901 Long term (current) use of anticoagulants: Secondary | ICD-10-CM | POA: Insufficient documentation

## 2020-09-24 DIAGNOSIS — E785 Hyperlipidemia, unspecified: Secondary | ICD-10-CM | POA: Insufficient documentation

## 2020-09-24 DIAGNOSIS — I4891 Unspecified atrial fibrillation: Secondary | ICD-10-CM | POA: Diagnosis not present

## 2020-09-24 DIAGNOSIS — R0602 Shortness of breath: Secondary | ICD-10-CM | POA: Diagnosis not present

## 2020-09-24 DIAGNOSIS — E1122 Type 2 diabetes mellitus with diabetic chronic kidney disease: Secondary | ICD-10-CM

## 2020-09-24 DIAGNOSIS — I2510 Atherosclerotic heart disease of native coronary artery without angina pectoris: Secondary | ICD-10-CM | POA: Insufficient documentation

## 2020-09-24 DIAGNOSIS — Z7982 Long term (current) use of aspirin: Secondary | ICD-10-CM | POA: Diagnosis not present

## 2020-09-24 DIAGNOSIS — F17210 Nicotine dependence, cigarettes, uncomplicated: Secondary | ICD-10-CM | POA: Diagnosis not present

## 2020-09-24 DIAGNOSIS — Z79899 Other long term (current) drug therapy: Secondary | ICD-10-CM | POA: Insufficient documentation

## 2020-09-24 DIAGNOSIS — I110 Hypertensive heart disease with heart failure: Secondary | ICD-10-CM | POA: Diagnosis not present

## 2020-09-24 DIAGNOSIS — Z8249 Family history of ischemic heart disease and other diseases of the circulatory system: Secondary | ICD-10-CM | POA: Insufficient documentation

## 2020-09-24 DIAGNOSIS — I5022 Chronic systolic (congestive) heart failure: Secondary | ICD-10-CM | POA: Diagnosis not present

## 2020-09-24 DIAGNOSIS — Z8673 Personal history of transient ischemic attack (TIA), and cerebral infarction without residual deficits: Secondary | ICD-10-CM | POA: Diagnosis not present

## 2020-09-24 DIAGNOSIS — K219 Gastro-esophageal reflux disease without esophagitis: Secondary | ICD-10-CM | POA: Insufficient documentation

## 2020-09-24 DIAGNOSIS — J449 Chronic obstructive pulmonary disease, unspecified: Secondary | ICD-10-CM | POA: Diagnosis not present

## 2020-09-24 DIAGNOSIS — Z720 Tobacco use: Secondary | ICD-10-CM

## 2020-09-24 DIAGNOSIS — E1151 Type 2 diabetes mellitus with diabetic peripheral angiopathy without gangrene: Secondary | ICD-10-CM | POA: Diagnosis not present

## 2020-09-24 DIAGNOSIS — I10 Essential (primary) hypertension: Secondary | ICD-10-CM

## 2020-09-24 LAB — GLUCOSE, CAPILLARY: Glucose-Capillary: 75 mg/dL (ref 70–99)

## 2020-09-24 NOTE — Patient Instructions (Signed)
Continue weighing daily and call for an overnight weight gain of > 2 pounds or a weekly weight gain of >5 pounds. 

## 2020-10-03 ENCOUNTER — Ambulatory Visit: Payer: PPO | Admitting: Dermatology

## 2020-10-03 ENCOUNTER — Other Ambulatory Visit: Payer: Self-pay

## 2020-10-03 DIAGNOSIS — L853 Xerosis cutis: Secondary | ICD-10-CM

## 2020-10-03 DIAGNOSIS — L578 Other skin changes due to chronic exposure to nonionizing radiation: Secondary | ICD-10-CM | POA: Diagnosis not present

## 2020-10-03 DIAGNOSIS — I152 Hypertension secondary to endocrine disorders: Secondary | ICD-10-CM | POA: Diagnosis not present

## 2020-10-03 DIAGNOSIS — E1159 Type 2 diabetes mellitus with other circulatory complications: Secondary | ICD-10-CM | POA: Diagnosis not present

## 2020-10-03 DIAGNOSIS — Q809 Congenital ichthyosis, unspecified: Secondary | ICD-10-CM | POA: Diagnosis not present

## 2020-10-03 DIAGNOSIS — E119 Type 2 diabetes mellitus without complications: Secondary | ICD-10-CM | POA: Diagnosis not present

## 2020-10-03 DIAGNOSIS — L820 Inflamed seborrheic keratosis: Secondary | ICD-10-CM

## 2020-10-03 DIAGNOSIS — I25810 Atherosclerosis of coronary artery bypass graft(s) without angina pectoris: Secondary | ICD-10-CM | POA: Diagnosis not present

## 2020-10-03 DIAGNOSIS — I350 Nonrheumatic aortic (valve) stenosis: Secondary | ICD-10-CM | POA: Diagnosis not present

## 2020-10-03 DIAGNOSIS — B351 Tinea unguium: Secondary | ICD-10-CM

## 2020-10-03 DIAGNOSIS — I639 Cerebral infarction, unspecified: Secondary | ICD-10-CM | POA: Diagnosis not present

## 2020-10-03 DIAGNOSIS — I5022 Chronic systolic (congestive) heart failure: Secondary | ICD-10-CM | POA: Diagnosis not present

## 2020-10-03 NOTE — Patient Instructions (Addendum)
Cryotherapy Aftercare  . Wash gently with soap and water everyday.   Marland Kitchen Apply Vaseline and Band-Aid daily until healed.   Soak feet in warm water and epsom salt daily for 20 minutes then gently rub off dead skin with washcloth after soaking to help exfoliate. Then apply Ketoconazole 2% cream BID including web spaces. Then apply Amlactin 12% moisturizing cream twice daily to feet and legs/arms (or Eucerin Roughness Relief Spot treatment, or Gold Bond Rough and Bumpy moisturizing cream).

## 2020-10-03 NOTE — Progress Notes (Signed)
   Follow-Up Visit   Subjective  [name] is a 74 y.o. male who presents for the following: Follow-up.  Hasn't treated feet yet because he lost the ketoconazole cream.  Recheck spots that were frozen last visit.  All better except ISK still there on L temple.    The following portions of the chart were reviewed this encounter and updated as appropriate:      Review of Systems:  No other skin or systemic complaints except as noted in HPI or Assessment and Plan.  Objective  Well appearing patient in no apparent distress; mood and affect are within normal limits.  A focused examination was performed including face, scalp, arms. Relevant physical exam findings are noted in the Assessment and Plan.  Objective  feet: Feet not examined today.  Previous visit- Ichthyotic scale with erythema of the feet; nails are trimmed;  ichthyotic scale on arms  Objective  Left Temple: Residual erythematous waxy stuck-on plaque    Assessment & Plan   Actinic Damage - chronic, secondary to cumulative UV radiation exposure/sun exposure over time - diffuse scaly erythematous macules with underlying dyspigmentation - Recommend daily broad spectrum sunscreen SPF 30+ to sun-exposed areas, reapply every 2 hours as needed.  - Call for new or changing lesions.  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  Lost Rx cream.  He will pick up another RF and start treatment as below.  Soak feet in warm water and epsom salt daily for 20 minutes then gently rub off dead skin with washcloth after soaking to help exfoliate. Then apply Ketoconazole 2% cream BID including web spaces. Then apply Amlactin 12% moisturizing cream twice daily to feet and legs/arms (or Eucerin Roughness Relief Spot treatment, or Gold Bond Rough and Bumpy moisturizing cream).     Other Related Medications ketoconazole (NIZORAL) 2 % cream  Inflamed  seborrheic keratosis Left Temple  Destruction of lesion - Left Temple  Destruction method: cryotherapy   Informed consent: discussed and consent obtained   Lesion destroyed using liquid nitrogen: Yes   Region frozen until ice ball extended beyond lesion: Yes   Outcome: patient tolerated procedure well with no complications   Post-procedure details: wound care instructions given    Return in about 9 weeks (around 12/05/2020) for xerosis on feet, AKs.  IJamesetta Orleans, CMA, am acting as scribe for Brendolyn Patty, MD .  Documentation: I have reviewed the above documentation for accuracy and completeness, and I agree with the above.  Brendolyn Patty MD

## 2020-10-23 ENCOUNTER — Other Ambulatory Visit: Payer: Self-pay

## 2020-10-23 ENCOUNTER — Inpatient Hospital Stay
Admission: EM | Admit: 2020-10-23 | Discharge: 2020-10-28 | DRG: 291 | Disposition: A | Discharge status: Home Health | Payer: PPO | Attending: Internal Medicine | Admitting: Internal Medicine

## 2020-10-23 ENCOUNTER — Emergency Department: Payer: PPO

## 2020-10-23 DIAGNOSIS — I2510 Atherosclerotic heart disease of native coronary artery without angina pectoris: Secondary | ICD-10-CM | POA: Diagnosis present

## 2020-10-23 DIAGNOSIS — I959 Hypotension, unspecified: Secondary | ICD-10-CM | POA: Diagnosis not present

## 2020-10-23 DIAGNOSIS — R778 Other specified abnormalities of plasma proteins: Secondary | ICD-10-CM | POA: Diagnosis not present

## 2020-10-23 DIAGNOSIS — Z8249 Family history of ischemic heart disease and other diseases of the circulatory system: Secondary | ICD-10-CM

## 2020-10-23 DIAGNOSIS — I5033 Acute on chronic diastolic (congestive) heart failure: Secondary | ICD-10-CM | POA: Diagnosis present

## 2020-10-23 DIAGNOSIS — J9690 Respiratory failure, unspecified, unspecified whether with hypoxia or hypercapnia: Secondary | ICD-10-CM | POA: Diagnosis not present

## 2020-10-23 DIAGNOSIS — N184 Chronic kidney disease, stage 4 (severe): Secondary | ICD-10-CM | POA: Diagnosis present

## 2020-10-23 DIAGNOSIS — E1165 Type 2 diabetes mellitus with hyperglycemia: Secondary | ICD-10-CM | POA: Diagnosis present

## 2020-10-23 DIAGNOSIS — Z8673 Personal history of transient ischemic attack (TIA), and cerebral infarction without residual deficits: Secondary | ICD-10-CM | POA: Diagnosis not present

## 2020-10-23 DIAGNOSIS — Z794 Long term (current) use of insulin: Secondary | ICD-10-CM

## 2020-10-23 DIAGNOSIS — I454 Nonspecific intraventricular block: Secondary | ICD-10-CM | POA: Diagnosis present

## 2020-10-23 DIAGNOSIS — F17210 Nicotine dependence, cigarettes, uncomplicated: Secondary | ICD-10-CM | POA: Diagnosis present

## 2020-10-23 DIAGNOSIS — I5023 Acute on chronic systolic (congestive) heart failure: Secondary | ICD-10-CM

## 2020-10-23 DIAGNOSIS — K219 Gastro-esophageal reflux disease without esophagitis: Secondary | ICD-10-CM | POA: Diagnosis present

## 2020-10-23 DIAGNOSIS — E739 Lactose intolerance, unspecified: Secondary | ICD-10-CM | POA: Diagnosis present

## 2020-10-23 DIAGNOSIS — R0602 Shortness of breath: Secondary | ICD-10-CM | POA: Diagnosis not present

## 2020-10-23 DIAGNOSIS — R000 Tachycardia, unspecified: Secondary | ICD-10-CM | POA: Diagnosis not present

## 2020-10-23 DIAGNOSIS — N189 Chronic kidney disease, unspecified: Secondary | ICD-10-CM

## 2020-10-23 DIAGNOSIS — I5032 Chronic diastolic (congestive) heart failure: Secondary | ICD-10-CM | POA: Diagnosis present

## 2020-10-23 DIAGNOSIS — Z85828 Personal history of other malignant neoplasm of skin: Secondary | ICD-10-CM

## 2020-10-23 DIAGNOSIS — E785 Hyperlipidemia, unspecified: Secondary | ICD-10-CM | POA: Diagnosis present

## 2020-10-23 DIAGNOSIS — Z79899 Other long term (current) drug therapy: Secondary | ICD-10-CM | POA: Diagnosis not present

## 2020-10-23 DIAGNOSIS — I509 Heart failure, unspecified: Secondary | ICD-10-CM | POA: Diagnosis not present

## 2020-10-23 DIAGNOSIS — J441 Chronic obstructive pulmonary disease with (acute) exacerbation: Secondary | ICD-10-CM | POA: Diagnosis present

## 2020-10-23 DIAGNOSIS — Z7901 Long term (current) use of anticoagulants: Secondary | ICD-10-CM | POA: Diagnosis present

## 2020-10-23 DIAGNOSIS — Z8701 Personal history of pneumonia (recurrent): Secondary | ICD-10-CM | POA: Diagnosis not present

## 2020-10-23 DIAGNOSIS — E1151 Type 2 diabetes mellitus with diabetic peripheral angiopathy without gangrene: Secondary | ICD-10-CM | POA: Diagnosis present

## 2020-10-23 DIAGNOSIS — I10 Essential (primary) hypertension: Secondary | ICD-10-CM | POA: Diagnosis present

## 2020-10-23 DIAGNOSIS — I130 Hypertensive heart and chronic kidney disease with heart failure and stage 1 through stage 4 chronic kidney disease, or unspecified chronic kidney disease: Principal | ICD-10-CM | POA: Diagnosis present

## 2020-10-23 DIAGNOSIS — Z8546 Personal history of malignant neoplasm of prostate: Secondary | ICD-10-CM | POA: Diagnosis not present

## 2020-10-23 DIAGNOSIS — I5043 Acute on chronic combined systolic (congestive) and diastolic (congestive) heart failure: Secondary | ICD-10-CM | POA: Diagnosis present

## 2020-10-23 DIAGNOSIS — Z20822 Contact with and (suspected) exposure to covid-19: Secondary | ICD-10-CM | POA: Diagnosis present

## 2020-10-23 DIAGNOSIS — R0689 Other abnormalities of breathing: Secondary | ICD-10-CM | POA: Diagnosis not present

## 2020-10-23 DIAGNOSIS — E1122 Type 2 diabetes mellitus with diabetic chronic kidney disease: Secondary | ICD-10-CM | POA: Diagnosis present

## 2020-10-23 DIAGNOSIS — I480 Paroxysmal atrial fibrillation: Secondary | ICD-10-CM | POA: Diagnosis not present

## 2020-10-23 DIAGNOSIS — I493 Ventricular premature depolarization: Secondary | ICD-10-CM | POA: Diagnosis present

## 2020-10-23 DIAGNOSIS — J90 Pleural effusion, not elsewhere classified: Secondary | ICD-10-CM | POA: Diagnosis not present

## 2020-10-23 DIAGNOSIS — R7989 Other specified abnormal findings of blood chemistry: Secondary | ICD-10-CM

## 2020-10-23 DIAGNOSIS — N183 Chronic kidney disease, stage 3 unspecified: Secondary | ICD-10-CM | POA: Diagnosis not present

## 2020-10-23 DIAGNOSIS — R069 Unspecified abnormalities of breathing: Secondary | ICD-10-CM | POA: Diagnosis not present

## 2020-10-23 DIAGNOSIS — R790 Abnormal level of blood mineral: Secondary | ICD-10-CM | POA: Diagnosis not present

## 2020-10-23 DIAGNOSIS — J9601 Acute respiratory failure with hypoxia: Secondary | ICD-10-CM | POA: Diagnosis present

## 2020-10-23 DIAGNOSIS — I4891 Unspecified atrial fibrillation: Secondary | ICD-10-CM | POA: Diagnosis present

## 2020-10-23 DIAGNOSIS — Z9119 Patient's noncompliance with other medical treatment and regimen: Secondary | ICD-10-CM | POA: Diagnosis not present

## 2020-10-23 DIAGNOSIS — J80 Acute respiratory distress syndrome: Secondary | ICD-10-CM | POA: Diagnosis not present

## 2020-10-23 DIAGNOSIS — Z951 Presence of aortocoronary bypass graft: Secondary | ICD-10-CM

## 2020-10-23 LAB — COMPREHENSIVE METABOLIC PANEL
ALT: 15 U/L (ref 0–44)
AST: 17 U/L (ref 15–41)
Albumin: 3.9 g/dL (ref 3.5–5.0)
Alkaline Phosphatase: 118 U/L (ref 38–126)
Anion gap: 13 (ref 5–15)
BUN: 30 mg/dL — ABNORMAL HIGH (ref 8–23)
CO2: 30 mmol/L (ref 22–32)
Calcium: 9.1 mg/dL (ref 8.9–10.3)
Chloride: 94 mmol/L — ABNORMAL LOW (ref 98–111)
Creatinine, Ser: 2.06 mg/dL — ABNORMAL HIGH (ref 0.61–1.24)
GFR, Estimated: 33 mL/min — ABNORMAL LOW (ref >60)
Glucose, Bld: 209 mg/dL — ABNORMAL HIGH (ref 70–99)
Potassium: 3.9 mmol/L (ref 3.5–5.1)
Sodium: 137 mmol/L (ref 135–145)
Total Bilirubin: 0.8 mg/dL (ref 0.3–1.2)
Total Protein: 7.7 g/dL (ref 6.5–8.1)

## 2020-10-23 LAB — MAGNESIUM: Magnesium: 2.4 mg/dL (ref 1.7–2.4)

## 2020-10-23 LAB — BRAIN NATRIURETIC PEPTIDE: B Natriuretic Peptide: 1001.5 pg/mL — ABNORMAL HIGH (ref 0.0–100.0)

## 2020-10-23 LAB — CBC WITH DIFFERENTIAL/PLATELET
Abs Immature Granulocytes: 0.06 10*3/uL (ref 0.00–0.07)
Basophils Absolute: 0 10*3/uL (ref 0.0–0.1)
Basophils Relative: 0 %
Eosinophils Absolute: 0.1 10*3/uL (ref 0.0–0.5)
Eosinophils Relative: 1 %
HCT: 43.3 % (ref 39.0–52.0)
Hemoglobin: 13.4 g/dL (ref 13.0–17.0)
Immature Granulocytes: 1 %
Lymphocytes Relative: 10 %
Lymphs Abs: 1.2 10*3/uL (ref 0.7–4.0)
MCH: 24.7 pg — ABNORMAL LOW (ref 26.0–34.0)
MCHC: 30.9 g/dL (ref 30.0–36.0)
MCV: 79.9 fL — ABNORMAL LOW (ref 80.0–100.0)
Monocytes Absolute: 0.9 10*3/uL (ref 0.1–1.0)
Monocytes Relative: 7 %
Neutro Abs: 10.4 10*3/uL — ABNORMAL HIGH (ref 1.7–7.7)
Neutrophils Relative %: 81 %
Platelets: 282 10*3/uL (ref 150–400)
RBC: 5.42 MIL/uL (ref 4.22–5.81)
RDW: 18.4 % — ABNORMAL HIGH (ref 11.5–15.5)
WBC: 12.7 10*3/uL — ABNORMAL HIGH (ref 4.0–10.5)
nRBC: 0 % (ref 0.0–0.2)

## 2020-10-23 LAB — PROCALCITONIN: Procalcitonin: 0.17 ng/mL

## 2020-10-23 LAB — TROPONIN I (HIGH SENSITIVITY): Troponin I (High Sensitivity): 55 ng/L — ABNORMAL HIGH (ref <18)

## 2020-10-23 MED ORDER — IPRATROPIUM-ALBUTEROL 0.5-2.5 (3) MG/3ML IN SOLN
9.0000 mL | Freq: Once | RESPIRATORY_TRACT | Status: AC
  Administered 2020-10-23: 23:00:00 9 mL via RESPIRATORY_TRACT
  Filled 2020-10-23: qty 12

## 2020-10-23 MED ORDER — METHYLPREDNISOLONE SODIUM SUCC 125 MG IJ SOLR
125.0000 mg | Freq: Once | INTRAMUSCULAR | Status: AC
  Administered 2020-10-23: 125 mg via INTRAVENOUS
  Filled 2020-10-23: qty 2

## 2020-10-23 NOTE — ED Provider Notes (Signed)
[facility] Emergency Department Provider Note  ____________________________________________   Event Date/Time   First MD Initiated Contact with Patient 10/23/20 2302     (approximate)  I have reviewed the triage vital signs and the nursing notes.   HISTORY  Chief Complaint Respiratory Distress   HPI [name] is a 74 y.o. male with a past medical history of A. fib on Eliquis, CHF with an EF estimate of 35 to 40%, COPD, ongoing tobacco abuse, CAD, DM, HTN, HDL, GERD, PVD, and prostate cancer not currently undergoing treatment presents EMS from home for assessment of some acute onset of shortness of breath.  Patient states he has a cough is chronic but has not changed significantly at all since baseline.  He states he started feeling bad in the last 24 hours and only complains of shortness of breath.  He denies any chest pain, headache, earache, sore throat, fevers, abdominal pain, back pain, nausea, vomiting, diarrhea, dysuria, rash or extremity pain.  States he has felt this way in the past but is not sure what was wrong at that time.  He denies any recent EtOH or illicit drug use.  He was placed on CPAP by EMS which he states improved his shortness of breath.  He states he has been taking his medicines as directed.         Past Medical History:  Diagnosis Date  . Arrhythmia    atrial fibrillation  . Basal cell carcinoma 05/2019   right nasal ala, Tx: EDC  . Basal cell carcinoma 04/12/2019   Left posterior ear. Nodular pattern, excoriated.   . CHF (congestive heart failure) ([place])   . COPD (chronic obstructive pulmonary disease) ([place])   . Coronary artery disease   . Diabetes mellitus without complication ([place])   . GERD (gastroesophageal reflux disease)   . HLD (hyperlipidemia)   . Hypertension   . Peripheral vascular disease ([place])   . Pneumonia   . Prostate cancer ([place])   . Squamous cell carcinoma of skin 08/10/2018   Left upper arm above elbow.  WD SCC.  Marland Kitchen Squamous cell carcinoma of skin 09/07/2018   Right forearm, below elbow. WD SCC. [facility] 01/10/2019.  Marland Kitchen Stroke [facility])     Patient Active Problem List   Diagnosis Date Noted  . Malnutrition of moderate degree 12/27/2019  . Hypertensive emergency 12/26/2019  . Hyperglycemia due to type 2 diabetes mellitus ([place]) 12/26/2019  . Chronic kidney disease (CKD) stage G3b/A3, moderately decreased glomerular filtration rate (GFR) between 30-44 mL/min/1.73 square meter and albuminuria creatinine ratio greater than 300 mg/g (HCC) 12/26/2019  . History of COVID-19 12/26/2019  . History of CVA (cerebrovascular accident) 12/26/2019  . 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 History of second degree heart block 12/26/2019  . Elevated LFTs 12/26/2019  . Pneumonia due to COVID-19 virus   . Acute systolic CHF (congestive heart failure) ([place]) 10/11/2019  . Malignant neoplasm of prostate ([place]) 11/24/2016  . Bright red rectal bleeding 10/02/2016  . COPD with acute exacerbation ([place]) 01/17/2016  . Chronic systolic heart failure ([place]) 12/12/2015  . Essential hypertension 12/12/2015  . COPD (chronic obstructive pulmonary disease) with chronic bronchitis ([place]) 12/12/2015  . Diabetes ([place]) 12/12/2015  . Tobacco abuse 12/12/2015  . Acute respiratory failure with hypoxia ([place]) 12/03/2015    Past Surgical History:  Procedure Laterality Date  . COLONOSCOPY    . CORONARY ARTERY BYPASS GRAFT  2006  . EYE SURGERY    . GOLD SEED IMPLANT N/A  12/30/2016   Procedure: GOLD SEED IMPLANT  x3;  Surgeon: Hollice Espy, MD;  Location: [facility] ORS;  Service: Urology;  Laterality: N/A;  . HEMORRHOID SURGERY N/A 10/02/2016   Procedure: PROCTOSCOPY AND CONTROL OF RECTAL BLEEDING;  Surgeon: Excell Seltzer, MD;  Location: [facility] ORS;  Service: General;  Laterality: N/A;  . PROSTATE BIOPSY    . TONSILLECTOMY      Prior to Admission medications   Medication Sig Start Date End Date Taking? Authorizing Provider  acetaminophen (TYLENOL) 325  MG tablet Take 2 tablets (650 mg total) by mouth every 6 (six) hours as needed for mild pain (or Fever >/= 101). 01/20/16   Gouru, Illene Silver, MD  albuterol (PROVENTIL HFA;VENTOLIN HFA) 108 (90 Base) MCG/ACT inhaler Inhale 2 puffs into the lungs every 6 (six) hours as needed for wheezing or shortness of breath.    [provider]  amLODipine (NORVASC) 5 MG tablet Take 1 tablet (5 mg total) by mouth daily. 12/30/19   Ezekiel Slocumb, DO  apixaban (ELIQUIS) 5 MG TABS tablet Take 1 tablet (5 mg total) by mouth 2 (two) times daily. 03/12/20   Alisa Graff, FNP  Apoaequorin (PREVAGEN PO) Take by mouth.    [provider]  aspirin EC 81 MG tablet Take 81 mg by mouth daily.    [provider]  bifidobacterium infantis (ALIGN) capsule Take 1 capsule by mouth every morning.    [provider]  cholecalciferol (VITAMIN D) 1000 units tablet Take 1,000 Units by mouth daily.    [provider]  Cinnamon 500 MG capsule Take 1,000 mg by mouth 2 (two) times daily.    [provider]  CRANBERRY EXTRACT PO Take 500 mg by mouth every morning.     [provider]  Ensure Max Protein (ENSURE MAX PROTEIN) LIQD Take 330 mLs (11 oz total) by mouth 2 (two) times daily between meals. 12/29/19   Ezekiel Slocumb, DO  gabapentin (NEURONTIN) 100 MG capsule Take 100 mg by mouth 2 (two) times daily.    [provider]  glucose 4 GM chewable tablet Chew 1 tablet by mouth as needed for low blood sugar.    [provider]  hydrALAZINE (APRESOLINE) 50 MG tablet Take 1 tablet (50 mg total) by mouth every 8 (eight) hours. 10/15/19   Ghimire, Henreitta Leber, MD  insulin aspart (NOVOLOG) 100 UNIT/ML injection 0-9 Units, Subcutaneous, 3 times daily with meals CBG < 70: Implement Hypoglycemia protocol/measures CBG 70 - 120: 0 units CBG 121 - 150: 1 unit CBG 151 - 200: 2 units CBG 201 - 250: 3 units CBG 251 - 300: 5 units CBG 301 - 350: 7 units CBG 351 - 400: 9  units CBG > 400: call MD 10/15/19   Jonetta Osgood, MD  isosorbide mononitrate (IMDUR) 30 MG 24 hr tablet Take 1 tablet (30 mg total) by mouth daily. 10/15/19   Ghimire, Henreitta Leber, MD  ketoconazole (NIZORAL) 2 % cream Apply to the feet and between the toes BID 07/18/20   Brendolyn Patty, MD  LANTUS SOLOSTAR 100 UNIT/ML Solostar Pen Inject 45 Units into the skin daily at 10 pm. Patient taking differently: Inject 47 Units into the skin daily at 10 pm.  10/15/19   Ghimire, Henreitta Leber, MD  Magnesium 250 MG TABS Take 250 mg by mouth daily.    [provider]  metoprolol tartrate (LOPRESSOR) 25 MG tablet Take 0.5 tablets (12.5 mg total) by mouth 2 (two) times daily. Patient  not taking: Reported on 09/24/2020 12/29/19   Ezekiel Slocumb, DO  Misc Natural Products (TOTAL MEMORY & FOCUS FORMULA PO) Take 1 capsule by mouth See admin instructions. Take 2 capsule at 9:00 AM, 1 capsule at 5:00 PM, and 1 capsule at bedtime.    [provider]  Multiple Vitamin (MULTIVITAMIN WITH MINERALS) TABS tablet Take 1 tablet by mouth daily.    [provider]  omeprazole (PRILOSEC) 20 MG capsule Take 20 mg by mouth daily.     [provider]  potassium chloride (K-DUR) 10 MEQ tablet Take 10 mEq by mouth daily.    [provider]  simvastatin (ZOCOR) 20 MG tablet Take 20 mg by mouth at bedtime.    [provider]  SUPER B COMPLEX/C PO Take 1 tablet by mouth every morning.    [provider]  torsemide (DEMADEX) 20 MG tablet Take 40 mg by mouth 2 (two) times daily.    [provider]  traZODone (DESYREL) 50 MG tablet Take 50 mg by mouth at bedtime.    [provider]  vitamin C (ASCORBIC ACID) 500 MG tablet Take 500 mg by mouth 2 (two) times daily.     [provider]    Allergies Patient has no known allergies.  Family History  Problem Relation Age of Onset  . Lung cancer Mother   . Heart attack Father   . Hypertension Other    . Cancer Maternal Grandfather        prostate  . Cancer Paternal Grandmother        breast    Social History Social History   Tobacco Use  . Smoking status: Current Every Day Smoker    Packs/day: 1.00    Years: 54.00    Pack years: 54.00    Types: Cigarettes  . Smokeless tobacco: Never Used  Vaping Use  . Vaping Use: Never used  Substance Use Topics  . Alcohol use: No    Alcohol/week: 0.0 standard drinks  . Drug use: No    Review of Systems  Review of Systems  Constitutional: Negative for chills and fever.  HENT: Negative for sore throat.   Eyes: Negative for pain.  Respiratory: Positive for shortness of breath. Negative for cough ( no change from baseline chronc cough) and stridor.   Cardiovascular: Negative for chest pain.  Gastrointestinal: Negative for vomiting.  Genitourinary: Negative for dysuria.  Musculoskeletal: Negative for myalgias.  Skin: Negative for rash.  Neurological: Negative for seizures, loss of consciousness and headaches.  Psychiatric/Behavioral: Negative for suicidal ideas.  All other systems reviewed and are negative.     ____________________________________________   PHYSICAL EXAM:  VITAL SIGNS: ED Triage Vitals  Enc Vitals Group     BP      Pulse      Resp      Temp      Temp src      SpO2      Weight      Height      Head Circumference      Peak Flow      Pain Score      Pain Loc      Pain Edu?      Excl. in [place]?    Vitals:   10/23/20 2311 10/24/20 0000  BP:  (!) 130/50  Pulse:    Resp:  (!) 22  Temp:    SpO2: 100% 100%   Physical Exam Vitals and nursing note reviewed.  Constitutional:      Appearance: He is well-developed and well-nourished.  HENT:     Head: Normocephalic and atraumatic.     Right Ear: External ear normal.     Left Ear: External ear normal.     Nose: Nose normal.     Mouth/Throat:     Mouth: Mucous membranes are moist.  Eyes:     Conjunctiva/sclera: Conjunctivae normal.  Cardiovascular:      Rate and Rhythm: Normal rate. Rhythm irregular.     Heart sounds: No murmur heard.   Pulmonary:     Effort: Tachypnea and respiratory distress present.     Breath sounds: Normal breath sounds.  Abdominal:     Palpations: Abdomen is soft.     Tenderness: There is no abdominal tenderness.  Musculoskeletal:     Cervical back: Neck supple.     Right lower leg: Edema present.     Left lower leg: Edema present.  Skin:    General: Skin is warm and dry.  Neurological:     Mental Status: He is alert and oriented to person, place, and time.  Psychiatric:        Mood and Affect: Mood and affect and mood normal.      ____________________________________________   LABS (all labs ordered are listed, but only abnormal results are displayed)  Labs Reviewed  BLOOD GAS, VENOUS - Abnormal; Notable for the following components:      Result Value   Bicarbonate 34.4 (*)    Acid-Base Excess 8.2 (*)    All other components within normal limits  CBC WITH DIFFERENTIAL/PLATELET - Abnormal; Notable for the following components:   WBC 12.7 (*)    MCV 79.9 (*)    MCH 24.7 (*)    RDW 18.4 (*)    Neutro Abs 10.4 (*)    All other components within normal limits  COMPREHENSIVE METABOLIC PANEL - Abnormal; Notable for the following components:   Chloride 94 (*)    Glucose, Bld 209 (*)    BUN 30 (*)    Creatinine, Ser 2.06 (*)    GFR, Estimated 33 (*)    All other components within normal limits  BRAIN NATRIURETIC PEPTIDE - Abnormal; Notable for the following components:   B Natriuretic Peptide 1,001.5 (*)    All other components within normal limits  TROPONIN I (HIGH SENSITIVITY) - Abnormal; Notable for the following components:   Troponin I (High Sensitivity) 55 (*)    All other components within normal limits  RESP PANEL BY RT-PCR (FLU A&B, COVID) ARPGX2  MAGNESIUM  PROCALCITONIN  HEMOGLOBIN A1C   ____________________________________________  EKG  A. fib with a ventricular rate of  77, LVH, Q waves in anterior leads as well as nonspecific changes in the lateral and inferior leads.  Otherwise unremarkable intervals. ____________________________________________  RADIOLOGY  ED MD interpretation: Prior sternotomy hardware.  There is some moderate pulmonary edema without large effusion, focal consolidation, no thorax or other acute process.  Official radiology report(s): DG Chest 1 View  Result Date: 10/23/2020 CLINICAL DATA:  Shortness of breath. EXAM: CHEST  1 VIEW COMPARISON:  June 02, 2020 FINDINGS: Multiple sternal wires and vascular clips are seen. Mild to moderate severity increased interstitial lung markings are seen. There is a small left pleural effusion. No pneumothorax is identified. The heart size and mediastinal contours are within normal limits. Degenerative changes seen throughout the thoracic spine. IMPRESSION: 1. Evidence of prior median sternotomy/CABG. 2. Mild to moderate severity interstitial edema. Electronically  Signed   By: Virgina Norfolk M.D.   On: 10/23/2020 23:19    ____________________________________________   PROCEDURES  Procedure(s) performed (including Critical Care):  .1-3 Lead EKG Interpretation Performed by: Lucrezia Starch, MD Authorized by: Lucrezia Starch, MD     Interpretation: abnormal     ECG rate assessment: normal     Rhythm: atrial fibrillation     Ectopy: none     Conduction: normal       ____________________________________________   INITIAL IMPRESSION / ASSESSMENT AND PLAN / ED COURSE      Patient presents with left to history exam for assessment of cancer of shortness of breath the last 24 hours.  He was placed on CPAP by EMS and was transitioned to BiPAP immediately on arrival.  On arrival he is afebrile and tachypneic with otherwise stable vital signs on BiPAP.  He does have some expiratory wheezing and has some edema on exam.  Differential includes but is not limited to ACS, arrhythmia, acute anemia,  COPD exacerbation, CHF exacerbation, pneumonia, and PE.  VBG on BiPAP shows no evidence of acute hypercarbic respiratory failure.  Given wheezing patient was treated for possible COPD exacerbation with duo nebs and steroids on arrival.  However he does appear slightly overloaded on exam and given congestion on chest x-ray and elevated BNP suspect he is also volume overloaded and suffering from acute heart failure exacerbation.  Could also explain patient's wheeze.  He was treated with below noted diuretics.  He is afebrile and while he has a very slight elevation of his white blood cell count he has no focal findings on x-ray and have low suspicion for pneumonia at this time..  Given edema on chest x-ray and on exam that could explain patient's shortness of breath and lower suspicion for PE at this time.  In addition edema is symmetric in lower extremities and patient is anticoagulated and states he is compliant.  No unstable arrhythmia on ECG.  Troponin is slightly elevated at 55.  Suspect mild demand ischemia versus chronic elevation secondary to CKD..  Creatinine is close to baseline at 2.06 and it appears it has ranged from 1.67-2.01 over the last year.  Glucose is 209 and has no evidence of acidosis or other significant electrolyte or metabolic derangements.  CBC with WBC count of 12.7 and hemoglobin of 13.4 with no other significant derangements.  BNP is just over 1000.  Magnesium is unremarkable.  Troponin slightly elevated 55 compared to 89 10 months ago.  I will plan to admit to medicine service for further evaluation management.  ____________________________________________   FINAL CLINICAL IMPRESSION(S) / ED DIAGNOSES  Final diagnoses:  Acute on chronic systolic congestive heart failure (HCC)  Chronic kidney disease, unspecified CKD stage  Elevated brain natriuretic peptide (BNP) level  Troponin I above reference range  Anticoagulated    Medications  insulin aspart (novoLOG) injection  0-15 Units (has no administration in time range)  furosemide (LASIX) injection 80 mg (has no administration in time range)  ipratropium-albuterol (DUONEB) 0.5-2.5 (3) MG/3ML nebulizer solution 9 mL (9 mLs Nebulization Given 10/23/20 2313)  methylPREDNISolone sodium succinate (SOLU-MEDROL) 125 mg/2 mL injection 125 mg (125 mg Intravenous Given 10/23/20 2330)     ED Discharge Orders    None       Note:  This document was prepared using Dragon voice recognition software and may include unintentional dictation errors.   Lucrezia Starch, MD 10/24/20 [phone]

## 2020-10-23 NOTE — ED Notes (Signed)
Pt has hx of COPD and CHF.  Pt does not wear any O2 at baseline.

## 2020-10-23 NOTE — Progress Notes (Signed)
Placed pt on Bipap 14/7 70%  Tolerating well at this time.

## 2020-10-23 NOTE — ED Triage Notes (Signed)
Pt presents to ER via [facility] from home.  Pt was on way to ER for SOB and felt like he couldn.t breathe, so wife stopped and called ems.  Pt's O2 sats were initially in the 80's on RA, and pt was noted to be belly breathing, but improved to the upper 90's on Cpap with improved respiratory effort.

## 2020-10-24 ENCOUNTER — Encounter: Payer: Self-pay | Admitting: Family Medicine

## 2020-10-24 DIAGNOSIS — E739 Lactose intolerance, unspecified: Secondary | ICD-10-CM | POA: Diagnosis present

## 2020-10-24 DIAGNOSIS — I5043 Acute on chronic combined systolic (congestive) and diastolic (congestive) heart failure: Secondary | ICD-10-CM | POA: Diagnosis present

## 2020-10-24 DIAGNOSIS — N184 Chronic kidney disease, stage 4 (severe): Secondary | ICD-10-CM | POA: Diagnosis present

## 2020-10-24 DIAGNOSIS — Z794 Long term (current) use of insulin: Secondary | ICD-10-CM | POA: Diagnosis not present

## 2020-10-24 DIAGNOSIS — E1165 Type 2 diabetes mellitus with hyperglycemia: Secondary | ICD-10-CM | POA: Diagnosis present

## 2020-10-24 DIAGNOSIS — J9601 Acute respiratory failure with hypoxia: Secondary | ICD-10-CM

## 2020-10-24 DIAGNOSIS — Z85828 Personal history of other malignant neoplasm of skin: Secondary | ICD-10-CM | POA: Diagnosis not present

## 2020-10-24 DIAGNOSIS — Z20822 Contact with and (suspected) exposure to covid-19: Secondary | ICD-10-CM | POA: Diagnosis present

## 2020-10-24 DIAGNOSIS — Z8701 Personal history of pneumonia (recurrent): Secondary | ICD-10-CM | POA: Diagnosis not present

## 2020-10-24 DIAGNOSIS — F17210 Nicotine dependence, cigarettes, uncomplicated: Secondary | ICD-10-CM | POA: Diagnosis present

## 2020-10-24 DIAGNOSIS — I10 Essential (primary) hypertension: Secondary | ICD-10-CM

## 2020-10-24 DIAGNOSIS — I493 Ventricular premature depolarization: Secondary | ICD-10-CM | POA: Diagnosis present

## 2020-10-24 DIAGNOSIS — Z79899 Other long term (current) drug therapy: Secondary | ICD-10-CM | POA: Diagnosis not present

## 2020-10-24 DIAGNOSIS — J441 Chronic obstructive pulmonary disease with (acute) exacerbation: Secondary | ICD-10-CM | POA: Diagnosis present

## 2020-10-24 DIAGNOSIS — E1122 Type 2 diabetes mellitus with diabetic chronic kidney disease: Secondary | ICD-10-CM | POA: Diagnosis present

## 2020-10-24 DIAGNOSIS — K219 Gastro-esophageal reflux disease without esophagitis: Secondary | ICD-10-CM | POA: Diagnosis present

## 2020-10-24 DIAGNOSIS — I454 Nonspecific intraventricular block: Secondary | ICD-10-CM | POA: Diagnosis present

## 2020-10-24 DIAGNOSIS — R778 Other specified abnormalities of plasma proteins: Secondary | ICD-10-CM | POA: Diagnosis present

## 2020-10-24 DIAGNOSIS — Z8673 Personal history of transient ischemic attack (TIA), and cerebral infarction without residual deficits: Secondary | ICD-10-CM

## 2020-10-24 DIAGNOSIS — I4891 Unspecified atrial fibrillation: Secondary | ICD-10-CM | POA: Diagnosis present

## 2020-10-24 DIAGNOSIS — I5032 Chronic diastolic (congestive) heart failure: Secondary | ICD-10-CM | POA: Diagnosis present

## 2020-10-24 DIAGNOSIS — Z9119 Patient's noncompliance with other medical treatment and regimen: Secondary | ICD-10-CM | POA: Diagnosis not present

## 2020-10-24 DIAGNOSIS — I130 Hypertensive heart and chronic kidney disease with heart failure and stage 1 through stage 4 chronic kidney disease, or unspecified chronic kidney disease: Secondary | ICD-10-CM | POA: Diagnosis present

## 2020-10-24 DIAGNOSIS — E785 Hyperlipidemia, unspecified: Secondary | ICD-10-CM | POA: Diagnosis present

## 2020-10-24 DIAGNOSIS — I2510 Atherosclerotic heart disease of native coronary artery without angina pectoris: Secondary | ICD-10-CM | POA: Diagnosis present

## 2020-10-24 DIAGNOSIS — Z8546 Personal history of malignant neoplasm of prostate: Secondary | ICD-10-CM | POA: Diagnosis not present

## 2020-10-24 DIAGNOSIS — E1151 Type 2 diabetes mellitus with diabetic peripheral angiopathy without gangrene: Secondary | ICD-10-CM | POA: Diagnosis present

## 2020-10-24 DIAGNOSIS — Z7901 Long term (current) use of anticoagulants: Secondary | ICD-10-CM | POA: Diagnosis present

## 2020-10-24 DIAGNOSIS — I5033 Acute on chronic diastolic (congestive) heart failure: Secondary | ICD-10-CM | POA: Diagnosis present

## 2020-10-24 LAB — HEMOGLOBIN A1C
Hgb A1c MFr Bld: 7.3 % — ABNORMAL HIGH (ref 4.8–5.6)
Hgb A1c MFr Bld: 7.6 % — ABNORMAL HIGH (ref 4.8–5.6)
Mean Plasma Glucose: 162.81 mg/dL
Mean Plasma Glucose: 171.42 mg/dL

## 2020-10-24 LAB — CBC
HCT: 39.6 % (ref 39.0–52.0)
Hemoglobin: 12.6 g/dL — ABNORMAL LOW (ref 13.0–17.0)
MCH: 25.1 pg — ABNORMAL LOW (ref 26.0–34.0)
MCHC: 31.8 g/dL (ref 30.0–36.0)
MCV: 79 fL — ABNORMAL LOW (ref 80.0–100.0)
Platelets: 248 10*3/uL (ref 150–400)
RBC: 5.01 MIL/uL (ref 4.22–5.81)
RDW: 17.7 % — ABNORMAL HIGH (ref 11.5–15.5)
WBC: 9.3 10*3/uL (ref 4.0–10.5)
nRBC: 0 % (ref 0.0–0.2)

## 2020-10-24 LAB — RESP PANEL BY RT-PCR (FLU A&B, COVID) ARPGX2
Influenza A by PCR: NEGATIVE
Influenza B by PCR: NEGATIVE
SARS Coronavirus 2 by RT PCR: NEGATIVE

## 2020-10-24 LAB — BASIC METABOLIC PANEL
Anion gap: 10 (ref 5–15)
BUN: 34 mg/dL — ABNORMAL HIGH (ref 8–23)
CO2: 28 mmol/L (ref 22–32)
Calcium: 8.9 mg/dL (ref 8.9–10.3)
Chloride: 97 mmol/L — ABNORMAL LOW (ref 98–111)
Creatinine, Ser: 1.96 mg/dL — ABNORMAL HIGH (ref 0.61–1.24)
GFR, Estimated: 35 mL/min — ABNORMAL LOW (ref >60)
Glucose, Bld: 260 mg/dL — ABNORMAL HIGH (ref 70–99)
Potassium: 4.3 mmol/L (ref 3.5–5.1)
Sodium: 135 mmol/L (ref 135–145)

## 2020-10-24 LAB — CBG MONITORING, ED
Glucose-Capillary: 236 mg/dL — ABNORMAL HIGH (ref 70–99)
Glucose-Capillary: 235 mg/dL — ABNORMAL HIGH (ref 70–99)
Glucose-Capillary: 245 mg/dL — ABNORMAL HIGH (ref 70–99)
Glucose-Capillary: 308 mg/dL — ABNORMAL HIGH (ref 70–99)
Glucose-Capillary: 293 mg/dL — ABNORMAL HIGH (ref 70–99)

## 2020-10-24 LAB — MAGNESIUM: Magnesium: 2.7 mg/dL — ABNORMAL HIGH (ref 1.7–2.4)

## 2020-10-24 LAB — TROPONIN I (HIGH SENSITIVITY): Troponin I (High Sensitivity): 54 ng/L — ABNORMAL HIGH (ref <18)

## 2020-10-24 MED ORDER — FUROSEMIDE 10 MG/ML IJ SOLN
40.0000 mg | Freq: Two times a day (BID) | INTRAMUSCULAR | Status: DC
  Administered 2020-10-24 – 2020-10-28 (×9): 40 mg via INTRAVENOUS
  Filled 2020-10-24 (×9): qty 4

## 2020-10-24 MED ORDER — HYDRALAZINE HCL 50 MG PO TABS
50.0000 mg | ORAL_TABLET | Freq: Three times a day (TID) | ORAL | Status: DC
Start: 2020-10-24 — End: 2020-10-28
  Administered 2020-10-24 – 2020-10-28 (×10): 50 mg via ORAL
  Filled 2020-10-24 (×12): qty 1

## 2020-10-24 MED ORDER — INSULIN GLARGINE 100 UNIT/ML ~~LOC~~ SOLN
20.0000 [IU] | Freq: Every day | SUBCUTANEOUS | Status: DC
  Administered 2020-10-24 – 2020-10-28 (×5): 20 [IU] via SUBCUTANEOUS
  Filled 2020-10-24 (×5): qty 0.2

## 2020-10-24 MED ORDER — PANTOPRAZOLE SODIUM 40 MG PO TBEC
40.0000 mg | DELAYED_RELEASE_TABLET | Freq: Every day | ORAL | Status: DC
  Administered 2020-10-24 – 2020-10-28 (×5): 40 mg via ORAL
  Filled 2020-10-24 (×5): qty 1

## 2020-10-24 MED ORDER — ONDANSETRON HCL 4 MG PO TABS: 4.0000 mg | ORAL_TABLET | Freq: Four times a day (QID) | ORAL | Status: DC | PRN

## 2020-10-24 MED ORDER — SIMVASTATIN 20 MG PO TABS
20.0000 mg | ORAL_TABLET | Freq: Every day | ORAL | Status: DC
  Administered 2020-10-24 – 2020-10-27 (×4): 20 mg via ORAL
  Filled 2020-10-24: qty 2
  Filled 2020-10-24 (×3): qty 1

## 2020-10-24 MED ORDER — ACETAMINOPHEN 650 MG RE SUPP: 650.0000 mg | Freq: Four times a day (QID) | RECTAL | Status: DC | PRN

## 2020-10-24 MED ORDER — SENNOSIDES-DOCUSATE SODIUM 8.6-50 MG PO TABS: 1.0000 | ORAL_TABLET | Freq: Every evening | ORAL | Status: DC | PRN

## 2020-10-24 MED ORDER — FUROSEMIDE 10 MG/ML IJ SOLN
80.0000 mg | Freq: Once | INTRAMUSCULAR | Status: AC
  Administered 2020-10-24: 02:00:00 80 mg via INTRAVENOUS
  Filled 2020-10-24: qty 8

## 2020-10-24 MED ORDER — TRAZODONE HCL 50 MG PO TABS
50.0000 mg | ORAL_TABLET | Freq: Every day | ORAL | Status: DC
  Administered 2020-10-25 – 2020-10-27 (×4): 50 mg via ORAL
  Filled 2020-10-24 (×4): qty 1

## 2020-10-24 MED ORDER — IPRATROPIUM-ALBUTEROL 20-100 MCG/ACT IN AERS
2.0000 | INHALATION_SPRAY | Freq: Four times a day (QID) | RESPIRATORY_TRACT | Status: DC | PRN
  Filled 2020-10-24: qty 4

## 2020-10-24 MED ORDER — INSULIN ASPART 100 UNIT/ML ~~LOC~~ SOLN
0.0000 [IU] | SUBCUTANEOUS | Status: DC
  Administered 2020-10-24: 02:00:00 5 [IU] via SUBCUTANEOUS
  Filled 2020-10-24: qty 1

## 2020-10-24 MED ORDER — AMLODIPINE BESYLATE 5 MG PO TABS
5.0000 mg | ORAL_TABLET | Freq: Every day | ORAL | Status: DC
  Administered 2020-10-24 – 2020-10-28 (×5): 5 mg via ORAL
  Filled 2020-10-24 (×5): qty 1

## 2020-10-24 MED ORDER — INSULIN ASPART 100 UNIT/ML ~~LOC~~ SOLN
0.0000 [IU] | SUBCUTANEOUS | Status: DC
  Administered 2020-10-24: 16:00:00 7 [IU] via SUBCUTANEOUS
  Administered 2020-10-24: 19:00:00 5 [IU] via SUBCUTANEOUS
  Administered 2020-10-24 (×2): 3 [IU] via SUBCUTANEOUS
  Administered 2020-10-25: 01:00:00 7 [IU] via SUBCUTANEOUS
  Administered 2020-10-25 (×3): 2 [IU] via SUBCUTANEOUS
  Administered 2020-10-25: 13:00:00 3 [IU] via SUBCUTANEOUS
  Administered 2020-10-26 (×3): 2 [IU] via SUBCUTANEOUS
  Administered 2020-10-26: 05:00:00 1 [IU] via SUBCUTANEOUS
  Administered 2020-10-26: 21:00:00 3 [IU] via SUBCUTANEOUS
  Administered 2020-10-27: 22:00:00 5 [IU] via SUBCUTANEOUS
  Administered 2020-10-27: 13:00:00 3 [IU] via SUBCUTANEOUS
  Administered 2020-10-27 – 2020-10-28 (×4): 2 [IU] via SUBCUTANEOUS
  Administered 2020-10-28: 01:00:00 3 [IU] via SUBCUTANEOUS
  Administered 2020-10-28: 14:00:00 5 [IU] via SUBCUTANEOUS
  Filled 2020-10-24 (×22): qty 1

## 2020-10-24 MED ORDER — GABAPENTIN 100 MG PO CAPS
100.0000 mg | ORAL_CAPSULE | Freq: Two times a day (BID) | ORAL | Status: DC
  Administered 2020-10-24 – 2020-10-28 (×10): 100 mg via ORAL
  Filled 2020-10-24 (×10): qty 1

## 2020-10-24 MED ORDER — HYDROCODONE-ACETAMINOPHEN 5-325 MG PO TABS
1.0000 | ORAL_TABLET | ORAL | Status: DC | PRN
  Filled 2020-10-24: qty 1

## 2020-10-24 MED ORDER — ACETAMINOPHEN 325 MG PO TABS
650.0000 mg | ORAL_TABLET | Freq: Four times a day (QID) | ORAL | Status: DC | PRN
  Filled 2020-10-24: qty 2

## 2020-10-24 MED ORDER — ONDANSETRON HCL 4 MG/2ML IJ SOLN: 4.0000 mg | Freq: Four times a day (QID) | INTRAMUSCULAR | Status: DC | PRN

## 2020-10-24 MED ORDER — ISOSORBIDE MONONITRATE ER 30 MG PO TB24
30.0000 mg | ORAL_TABLET | Freq: Every day | ORAL | Status: DC
Start: 2020-10-24 — End: 2020-10-28
  Administered 2020-10-24 – 2020-10-28 (×5): 30 mg via ORAL
  Filled 2020-10-24 (×5): qty 1

## 2020-10-24 MED ORDER — APIXABAN 5 MG PO TABS
5.0000 mg | ORAL_TABLET | Freq: Two times a day (BID) | ORAL | Status: DC
  Administered 2020-10-24 – 2020-10-28 (×10): 5 mg via ORAL
  Filled 2020-10-24 (×11): qty 1

## 2020-10-24 NOTE — Progress Notes (Signed)
Brief hospitalist update note This is a nonbillable note Please see same-day H&P from Dr. Myna Hidalgo for full billable details  Briefly, this is a 74 year old male with known heart failure with reduced ejection fraction 35 to 40% and mild mitral insufficiency who presented to the emergency room with complaints of progressive shortness of breath.  Was found to be hypoxic on arrival.  Chest imaging significant for interstitial edema.  Patient required initiation of NIPPV therapy for symptoms and refractory fluid overload.  He seemed to respond to this therapy quite well.  He was awake and alert on my arrival.  He answers all questions appropriately with intact mentation.  Weaning off BiPAP at this time.  Patient stable for transfer to floor.  We will continue intravenous diuretic regimen.  Target net -1 to 1.5 L daily.  Strict I's and O's, daily weights.  Updated patient's wife Bethena Roys [phone]  Ralene Muskrat MD

## 2020-10-24 NOTE — ED Notes (Signed)
Patient continues to tolerate Bipap well. Report given to [employer]

## 2020-10-24 NOTE — ED Notes (Signed)
Patient c/o feeling short of breath. Pulse is 95-96% on 3L . O2 increased to 4L and will reassess.

## 2020-10-24 NOTE — ED Notes (Signed)
Patient is dozing off. Pulse ox is 98% on bipap. RR 18 when sleeping. Patient wakes easily and RR is then 24. Patient states he feels better. Patient repositioned on hospital bed.

## 2020-10-24 NOTE — ED Notes (Signed)
[name] came to bedside and placed the patient back on Bipap. Patient has decreased his respiratory rate from 34 to 23 and is at 99%. Patient is calm and tolerating Bi-pap well. Patient states he wants to leave the Bi-pap on all night for comfort. Patient states it was the same time last night that he became short of breath.

## 2020-10-24 NOTE — Consult Note (Signed)
    Heart Failure Nurse Navigator Note  HFrEF 35-40%, mild mitral regurgitation mild late elevated pulmonary artery pressures echocardiogram performed December 25, 2019.   He presented to the emergency room with complaints of progressive shortness of breath.  Chest x-ray revealed interstitial edema.  Co morbidities:   Hypertension Coronary artery disease Atrial fibrillation Insulin-dependent diabetes COPD Chronic kidney disease  History of CVA.   Medications:  Amlodipine 5 mg daily Apixaban 5 mg twice daily Lasix 40 mg twice daily IV Hydralazine 50 mg every 8 hours Isosorbide mononitrate 30 mg daily  Labs:  Sodium 135, potassium 4.3, chloride 97, CO2 28, BUN 34, creatinine 1.96, hemoglobin 12.6, creatinine 39.6, magnesium 2.7 Blood pressure 126/53 Weight 77.1 kg BMI 27.44    Assessment:  General-she is awake and alert lying on a gurney in the emergency room.  Appears in no acute distress at this time.   HEENT-pupils are equal, nonicteric, no JVD noted.  Cardiac -heart tones are irregular.  Abdomen-rounded soft nontender.   Musculoskeletal-pitting lower extremity edema, along with skin discoloration significant for venous insufficiency.  Psych-he is pleasant and appropriate makes good eye contact.  Neurologic-moves all extremities, speech is clear.    Initial visit with patient in the emergency room.  States that he does follow with Darylene Price with the outpatient heart failure clinic.  Last seen Otila Kluver on September 24, 2020, on  presentation complained of moderate shortness of breath with minimal exertion but felt that that was chronic in nature.  He denied any weight gain at that time.    He states that on this admission he had just noticed in 24 hours of worsening shortness of breath.  He felt that his weight has been steady at 170.   He was given heart failure teaching booklet along with the handout for eating well in the holiday.  Pricilla Riffle RN  [facility]  Discussed salt in his diet and he states that he has cut that out.  Also discussed upcoming holiday and foods to avoid.

## 2020-10-24 NOTE — ED Notes (Signed)
Patient placed in hospital bed

## 2020-10-24 NOTE — H&P (Signed)
History and Physical    Om [name] HYI:[id number] DOB: 02-08-1946 DOA: 10/23/2020  PCP: Barry Late, MD   Patient coming from: Home   Chief Complaint: SOB   HPI: [name] is a 74 y.o. male with medical history significant for hypertension, coronary artery disease, atrial fibrillation on Eliquis, insulin-dependent diabetes mellitus, COPD, chronic kidney disease stage IV, and chronic combined systolic and diastolic CHF, now presenting to emergency department with shortness of breath.  Patient reports that he developed shortness of breath roughly 24 hours ago and has progressively worsened.  Denies any fevers, chills, chest pain, or change in his chronic cough.  He had his wife drive him to the emergency department but continued to worsen en route so they pulled over and called EMS who found him to be saturating 80% and using accessory muscles.  He was started on CPAP and brought into the ED  ED Course: Upon arrival to the ED, patient is found to be afebrile, saturating well on BiPAP, tachypneic, and with stable blood pressure.  EKG features atrial fibrillation with PVCs and LVH with repolarization abnormality.  Chest x-ray concerning for interstitial edema.  Chemistry panel notable for glucose of 209 and creatinine 2.06, up from 1.88 in July.  CBC features a leukocytosis 12,700.  High-sensitivity troponin is 55 and then 54.  BNP is at thousand and 2.  Procalcitonin 0.17 and COVID-19 PCR is negative.  Patient was started on BiPAP and treated with 80 mg IV Lasix, IV Solu-Medrol, duo nebs, and insulin.  Review of Systems:  All other systems reviewed and apart from HPI, are negative.  Past Medical History:  Diagnosis Date  . Arrhythmia    atrial fibrillation  . Basal cell carcinoma 05/2019   right nasal ala, Tx: [facility]  . Basal cell carcinoma 04/12/2019   Left posterior ear. Nodular pattern, excoriated.   . CHF (congestive heart failure) ([place])   . COPD (chronic obstructive pulmonary disease)  ([place])   . Coronary artery disease   . Diabetes mellitus without complication ([place])   . GERD (gastroesophageal reflux disease)   . HLD (hyperlipidemia)   . Hypertension   . Peripheral vascular disease ([place])   . Pneumonia   . Prostate cancer ([place])   . Squamous cell carcinoma of skin 08/10/2018   Left upper arm above elbow. [facility].  Marland Kitchen Squamous cell carcinoma of skin 09/07/2018   Right forearm, below elbow. [facility]. [facility] 01/10/2019.  Marland Kitchen Stroke [facility])     Past Surgical History:  Procedure Laterality Date  . COLONOSCOPY    . CORONARY ARTERY BYPASS GRAFT  2006  . EYE SURGERY    . GOLD SEED IMPLANT N/A 12/30/2016   Procedure: GOLD SEED IMPLANT  x3;  Surgeon: Hollice Espy, MD;  Location: [facility] ORS;  Service: Urology;  Laterality: N/A;  . HEMORRHOID SURGERY N/A 10/02/2016   Procedure: PROCTOSCOPY AND CONTROL OF RECTAL BLEEDING;  Surgeon: Excell Seltzer, MD;  Location: [facility] ORS;  Service: General;  Laterality: N/A;  . PROSTATE BIOPSY    . TONSILLECTOMY      Social History:   reports that he has been smoking cigarettes. He has a 54.00 pack-year smoking history. He has never used smokeless tobacco. He reports that he does not drink alcohol and does not use drugs.  Allergies  Allergen Reactions  . Lactose Intolerance (Gi)     Family History  Problem Relation Age of Onset  . Lung cancer Mother   . Heart attack Father   . Hypertension  Other   . Cancer Maternal Grandfather        prostate  . Cancer Paternal Grandmother        breast     Prior to Admission medications   Medication Sig Start Date End Date Taking? Authorizing Provider  acetaminophen (TYLENOL) 325 MG tablet Take 2 tablets (650 mg total) by mouth every 6 (six) hours as needed for mild pain (or Fever >/= 101). 01/20/16  Yes Gouru, Illene Silver, MD  albuterol (PROVENTIL HFA;VENTOLIN HFA) 108 (90 Base) MCG/ACT inhaler Inhale 2 puffs into the lungs every 6 (six) hours as needed for wheezing or shortness of breath.   Yes [provider]  amLODipine (NORVASC) 5 MG tablet Take 1 tablet (5 mg total) by mouth daily. 12/30/19  Yes Ezekiel Slocumb, DO  apixaban (ELIQUIS) 5 MG TABS tablet Take 1 tablet (5 mg total) by mouth 2 (two) times daily. 03/12/20  Yes Hackney, Tina A, FNP  Apoaequorin (PREVAGEN PO) Take by mouth.   Yes [provider]  bifidobacterium infantis (ALIGN) capsule Take 1 capsule by mouth every morning.   Yes [provider]  cholecalciferol (VITAMIN D) 1000 units tablet Take 1,000 Units by mouth daily.   Yes [provider]  Cinnamon 500 MG capsule Take 1,000 mg by mouth 2 (two) times daily.   Yes [provider]  COMBIVENT RESPIMAT 20-100 MCG/ACT AERS respimat Inhale 2 puffs into the lungs 4 (four) times daily as needed. 09/18/20  Yes [provider]  CRANBERRY EXTRACT PO Take 500 mg by mouth every morning.    Yes [provider]  Ensure Max Protein (ENSURE MAX PROTEIN) LIQD Take 330 mLs (11 oz total) by mouth 2 (two) times daily between meals. 12/29/19  Yes Nicole Kindred A, DO  gabapentin (NEURONTIN) 100 MG capsule Take 100 mg by mouth 2 (two) times daily.   Yes [provider]  hydrALAZINE (APRESOLINE) 50 MG tablet Take 1 tablet (50 mg total) by mouth every 8 (eight) hours. 10/15/19  Yes Ghimire, Henreitta Leber, MD  insulin aspart (NOVOLOG) 100 UNIT/ML injection 0-9 Units, Subcutaneous, 3 times daily with meals CBG < 70: Implement Hypoglycemia protocol/measures CBG 70 - 120: 0 units CBG 121 - 150: 1 unit CBG 151 - 200: 2 units CBG 201 - 250: 3 units CBG 251 - 300: 5 units CBG 301 - 350: 7 units CBG 351 - 400: 9 units CBG > 400: call MD Patient taking differently: Inject 5 Units into the skin 3 (three) times daily with meals. 10/15/19  Yes Ghimire, Henreitta Leber, MD  isosorbide mononitrate (IMDUR) 30 MG 24 hr tablet Take 1 tablet (30 mg total) by mouth daily. 10/15/19  Yes Ghimire, Henreitta Leber, MD  lactase (LACTAID) 3000 units tablet Take 3,000  Units by mouth 3 (three) times daily between meals as needed.   Yes [provider]  LANTUS SOLOSTAR 100 UNIT/ML Solostar Pen Inject 45 Units into the skin daily at 10 pm. 10/15/19  Yes Ghimire, Henreitta Leber, MD  Magnesium 250 MG TABS Take 250 mg by mouth daily.   Yes [provider]  Misc Natural Products (TOTAL MEMORY & FOCUS FORMULA PO) Take 1 capsule by mouth See admin instructions. Take 2 capsule at 9:00 AM, 1 capsule at 5:00 PM, and 1 capsule at bedtime.   Yes [provider]  Multiple Vitamin (MULTIVITAMIN WITH MINERALS) TABS tablet Take 1 tablet by mouth daily.   Yes [provider]  NON FORMULARY Take 1 Dose by  mouth 3 (three) times daily. Relief Factor OTC   Yes [provider]  omeprazole (PRILOSEC) 20 MG capsule Take 20 mg by mouth daily.   Yes [provider]  potassium chloride (K-DUR) 10 MEQ tablet Take 10 mEq by mouth daily.   Yes [provider]  simvastatin (ZOCOR) 20 MG tablet Take 20 mg by mouth at bedtime.   Yes [provider]  SUPER B COMPLEX/C PO Take 1 tablet by mouth every morning.   Yes [provider]  torsemide (DEMADEX) 20 MG tablet Take 40 mg by mouth 2 (two) times daily.   Yes [provider]  traZODone (DESYREL) 50 MG tablet Take 50 mg by mouth at bedtime.   Yes [provider]  vitamin C (ASCORBIC ACID) 500 MG tablet Take 500 mg by mouth 2 (two) times daily.    Yes [provider]  aspirin EC 81 MG tablet Take 81 mg by mouth daily. Patient not taking: Reported on 10/24/2020    [provider]  glucose 4 GM chewable tablet Chew 1 tablet by mouth as needed for low blood sugar.    [provider]  ketoconazole (NIZORAL) 2 % cream Apply to the feet and between the toes BID Patient not taking: No sig reported 07/18/20   Brendolyn Patty, MD  metoprolol tartrate (LOPRESSOR) 25 MG tablet Take 0.5 tablets (12.5 mg total) by mouth 2 (two) times  daily. Patient not taking: No sig reported 12/29/19   Ezekiel Slocumb, DO    Physical Exam: Vitals:   10/23/20 2310 10/23/20 2311 10/24/20 0000 10/24/20 0138  BP:   (!) 130/50 115/70  Pulse:    77  Resp:   (!) 22 (!) 21  Temp:      TempSrc:      SpO2: 100% 100% 100% 100%  Weight:      Height:         Constitutional: NAD, calm  Eyes: PERTLA, lids and conjunctivae normal ENMT: Mucous membranes are moist. Posterior pharynx clear of any exudate or lesions.   Neck: normal, supple, no masses, no thyromegaly Respiratory: clear to auscultation bilaterally, no wheezing, no crackles. No accessory muscle use.  Cardiovascular: S1 & S2 heard, regular rate and rhythm. No extremity edema. No significant JVD. Abdomen: No distension, no tenderness, soft. Bowel sounds active.  Musculoskeletal: no clubbing / cyanosis. No joint deformity upper and lower extremities.   Skin: no significant rashes, lesions, ulcers. Warm, dry, well-perfused. Neurologic: CN 2-12 grossly intact. Sensation intact, DTR normal. Strength 5/5 in all 4 limbs.  Psychiatric: Alert and oriented to person, place, and situation. Pleasant and cooperative.    Labs and Imaging on Admission: I have personally reviewed following labs and imaging studies  CBC: Recent Labs  Lab 10/23/20 2315  WBC 12.7*  NEUTROABS 10.4*  HGB 13.4  HCT 43.3  MCV 79.9*  PLT 924   Basic Metabolic Panel: Recent Labs  Lab 10/23/20 2315  NA 137  K 3.9  CL 94*  CO2 30  GLUCOSE 209*  BUN 30*  CREATININE 2.06*  CALCIUM 9.1  MG 2.4   GFR: Estimated Creatinine Clearance: 30.3 mL/min (A) (by C-G formula based on SCr of 2.06 mg/dL (H)). Liver Function Tests: Recent Labs  Lab 10/23/20 2315  AST 17  ALT 15  ALKPHOS 118  BILITOT 0.8  PROT 7.7  ALBUMIN 3.9   No results for input(s): LIPASE, AMYLASE in the last 168 hours. No results for input(s): AMMONIA in the last  168 hours. Coagulation Profile: No results for input(s): INR, PROTIME  in the last 168 hours. Cardiac Enzymes: No results for input(s): CKTOTAL, CKMB, CKMBINDEX, TROPONINI in the last 168 hours. BNP (last 3 results) No results for input(s): PROBNP in the last 8760 hours. HbA1C: No results for input(s): HGBA1C in the last 72 hours. CBG: Recent Labs  Lab 10/24/20 0137  GLUCAP 236*   Lipid Profile: No results for input(s): CHOL, HDL, LDLCALC, TRIG, CHOLHDL, LDLDIRECT in the last 72 hours. Thyroid Function Tests: No results for input(s): TSH, T4TOTAL, FREET4, T3FREE, THYROIDAB in the last 72 hours. Anemia Panel: No results for input(s): VITAMINB12, FOLATE, FERRITIN, TIBC, IRON, RETICCTPCT in the last 72 hours. Urine analysis:    Component Value Date/Time   COLORURINE YELLOW (A) 01/15/2016 2115   APPEARANCEUR CLEAR (A) 01/15/2016 2115   APPEARANCEUR Clear 04/28/2014 2346   LABSPEC 1.010 01/15/2016 2115   LABSPEC 1.009 04/28/2014 2346   PHURINE 5.0 01/15/2016 2115   GLUCOSEU NEGATIVE 01/15/2016 2115   GLUCOSEU Negative 04/28/2014 2346   HGBUR 1+ (A) 01/15/2016 2115   BILIRUBINUR NEGATIVE 01/15/2016 2115   BILIRUBINUR Negative 04/28/2014 2346   [place] 01/15/2016 2115   PROTEINUR 100 (A) 01/15/2016 2115   NITRITE NEGATIVE 01/15/2016 2115   LEUKOCYTESUR TRACE (A) 01/15/2016 2115   LEUKOCYTESUR 1+ 04/28/2014 2346   Sepsis Labs: @LABRCNTIP (procalcitonin:4,lacticidven:4) ) Recent Results (from the past 240 hour(s))  Resp Panel by RT-PCR (Flu A&B, Covid) Nasopharyngeal Swab     Status: None   Collection Time: 10/23/20 11:33 PM   Specimen: Nasopharyngeal Swab; Nasopharyngeal(NP) swabs in vial transport medium  Result Value Ref Range Status   SARS Coronavirus 2 by RT PCR NEGATIVE NEGATIVE Final    Comment: (NOTE) SARS-CoV-2 target nucleic acids are NOT DETECTED.  The SARS-CoV-2 RNA is generally detectable in upper respiratory specimens during the acute phase of infection. The lowest concentration of SARS-CoV-2 viral copies this assay  can detect is 138 copies/mL. A negative result does not preclude SARS-Cov-2 infection and should not be used as the sole basis for treatment or other patient management decisions. A negative result may occur with  improper specimen collection/handling, submission of specimen other than nasopharyngeal swab, presence of viral mutation(s) within the areas targeted by this assay, and inadequate number of viral copies(<138 copies/mL). A negative result must be combined with clinical observations, patient history, and epidemiological information. The expected result is Negative.  Fact Sheet for Patients:  EntrepreneurPulse.com.au  Fact Sheet for Healthcare Providers:  IncredibleEmployment.be  This test is no t yet approved or cleared by the [place] FDA and  has been authorized for detection and/or diagnosis of SARS-CoV-2 by FDA under an Emergency Use Authorization (EUA). This EUA will remain  in effect (meaning this test can be used) for the duration of the COVID-19 declaration under Section 564(b)(1) of the Act, 21 U.S.C.section 360bbb-3(b)(1), unless the authorization is terminated  or revoked sooner.       Influenza A by PCR NEGATIVE NEGATIVE Final   Influenza B by PCR NEGATIVE NEGATIVE Final    Comment: (NOTE) The Xpert Xpress SARS-CoV-2/FLU/RSV plus assay is intended as an aid in the diagnosis of influenza from Nasopharyngeal swab specimens and should not be used as a sole basis for treatment. Nasal washings and aspirates are unacceptable for Xpert Xpress SARS-CoV-2/FLU/RSV testing.  Fact Sheet for Patients: EntrepreneurPulse.com.au  Fact Sheet for Healthcare Providers: IncredibleEmployment.be  This test is not yet approved or cleared by the [place] FDA and has been  authorized for detection and/or diagnosis of SARS-CoV-2 by FDA under an Emergency Use Authorization (EUA). This EUA will  remain in effect (meaning this test can be used) for the duration of the COVID-19 declaration under Section 564(b)(1) of the Act, 21 U.S.C. section 360bbb-3(b)(1), unless the authorization is terminated or revoked.  Performed at [facility], [address] [place], [place] [address]      Radiological Exams on Admission: DG Chest 1 View  Result Date: 10/23/2020 CLINICAL DATA:  Shortness of breath. EXAM: CHEST  1 VIEW COMPARISON:  June 02, 2020 FINDINGS: Multiple sternal wires and vascular clips are seen. Mild to moderate severity increased interstitial lung markings are seen. There is a small left pleural effusion. No pneumothorax is identified. The heart size and mediastinal contours are within normal limits. Degenerative changes seen throughout the thoracic spine. IMPRESSION: 1. Evidence of prior median sternotomy/CABG. 2. Mild to moderate severity interstitial edema. Electronically Signed   By: Virgina Norfolk M.D.   On: 10/23/2020 23:19    EKG: Independently reviewed. Atrial fibrillation, PVC, LVH with repolarization abnormality.   Assessment/Plan   1. Acute on chronic combined systolic & diastolic CHF; Acute hypoxic respiratory failure  - Presents with progressive SOB over 24 hours, was saturating 80% with EMS and had increased WOB, and found in ED to have interstitial edema on CXR   - EF was 35-40% with grade 1 diastolic dysfunction in February 2021  - Treated with IV Lasix and BiPAP in ED  - Continue diuresis with Lasix 40 mg IV q12h, monitor weight and I/Os, continue BiPAP as needed    2. COPD  - Primary problem appears to be CHF  - Continue Combivent    3. Elevated troponin  - HS troponin elevated to 55 then 54 without chest pain  - Low suspicion for ACS, likely secondary to acute CHF  - Treat CHF as above, continue statin and nitrates   4. CKD IV  - SCr is 2.06 on admission, close to apparent baseline  - Renally-dose medications, monitor renal function  and electrolytes while diuresing   5. Hypertension  - BP at goal, continue Norvasc and hydralazine    6. History of CVA  - Continue statin and Eliquis    7. Atrial fibrillation  - CHADS-VASc at least 7 (age x2, CVA x2, DM, CHF, HTN) - Continue Eliquis   8. Insulin-dependent DM  - A1c was 7.6% in February 2021  - Continue CBGs and insulin     DVT prophylaxis: Eliquis  Code Status: Full  Family Communication: Discussed with patient  Disposition Plan:  Patient is from: Home  Anticipated d/c is to: TBD Anticipated d/c date is: 10/27/20 Patient currently: Pending improvement in respiratory status, currently on BiPAP Consults called: None  Admission status: Inpatient     Vianne Bulls, MD [facility]  10/24/2020, 4:01 AM

## 2020-10-24 NOTE — ED Notes (Signed)
Patient continues to c/o increased shortness of breath. Patient is labored, tachypneic,  breathing with mouth open at 4L O2 via [place] at 93%. RT paged. Hospitalist informed.

## 2020-10-24 NOTE — ED Notes (Signed)
Patient did not care for evening meal. Patient did eat the roll and requested some graham crackers also which he received.

## 2020-10-24 NOTE — ED Notes (Signed)
MD at bedside. 

## 2020-10-24 NOTE — ED Notes (Signed)
A call was placed for a hospital bed for the patient.

## 2020-10-24 NOTE — ED Notes (Signed)
Patient is tolerating Bipap.

## 2020-10-24 NOTE — ED Notes (Signed)
Patient was given a remote. Will test for CBG and give insulin when lunch tray is present.

## 2020-10-24 NOTE — ED Notes (Signed)
Breakfast tray provided to patient.  Bipap placed on standby and patient converted to nasal cannula at 3L.  Pt tolerating well at this time.

## 2020-10-25 ENCOUNTER — Inpatient Hospital Stay: Payer: PPO

## 2020-10-25 DIAGNOSIS — I5043 Acute on chronic combined systolic (congestive) and diastolic (congestive) heart failure: Secondary | ICD-10-CM | POA: Diagnosis not present

## 2020-10-25 LAB — CBC
HCT: 38.1 % — ABNORMAL LOW (ref 39.0–52.0)
Hemoglobin: 11.8 g/dL — ABNORMAL LOW (ref 13.0–17.0)
MCH: 24.8 pg — ABNORMAL LOW (ref 26.0–34.0)
MCHC: 31 g/dL (ref 30.0–36.0)
MCV: 80 fL (ref 80.0–100.0)
Platelets: 239 K/uL (ref 150–400)
RBC: 4.76 MIL/uL (ref 4.22–5.81)
RDW: 17.6 % — ABNORMAL HIGH (ref 11.5–15.5)
WBC: 13.9 K/uL — ABNORMAL HIGH (ref 4.0–10.5)
nRBC: 0 % (ref 0.0–0.2)

## 2020-10-25 LAB — CBG MONITORING, ED
Glucose-Capillary: 110 mg/dL — ABNORMAL HIGH (ref 70–99)
Glucose-Capillary: 152 mg/dL — ABNORMAL HIGH (ref 70–99)
Glucose-Capillary: 210 mg/dL — ABNORMAL HIGH (ref 70–99)
Glucose-Capillary: 301 mg/dL — ABNORMAL HIGH (ref 70–99)

## 2020-10-25 LAB — BASIC METABOLIC PANEL WITH GFR
Anion gap: 8 (ref 5–15)
BUN: 44 mg/dL — ABNORMAL HIGH (ref 8–23)
CO2: 31 mmol/L (ref 22–32)
Calcium: 8.7 mg/dL — ABNORMAL LOW (ref 8.9–10.3)
Chloride: 98 mmol/L (ref 98–111)
Creatinine, Ser: 2.05 mg/dL — ABNORMAL HIGH (ref 0.61–1.24)
GFR, Estimated: 33 mL/min — ABNORMAL LOW
Glucose, Bld: 143 mg/dL — ABNORMAL HIGH (ref 70–99)
Potassium: 4.2 mmol/L (ref 3.5–5.1)
Sodium: 137 mmol/L (ref 135–145)

## 2020-10-25 LAB — GLUCOSE, CAPILLARY
Glucose-Capillary: 153 mg/dL — ABNORMAL HIGH (ref 70–99)
Glucose-Capillary: 189 mg/dL — ABNORMAL HIGH (ref 70–99)

## 2020-10-25 MED ORDER — METOLAZONE 2.5 MG PO TABS
2.5000 mg | ORAL_TABLET | Freq: Every day | ORAL | Status: DC
  Administered 2020-10-25 – 2020-10-28 (×4): 2.5 mg via ORAL
  Filled 2020-10-25 (×4): qty 1

## 2020-10-25 NOTE — Progress Notes (Signed)
PROGRESS NOTE    [name]  YCX:[id number] DOB: 1946/04/08 DOA: 10/23/2020 PCP: [name], MD   Brief Narrative:  74 year old male with known heart failure with reduced ejection fraction 35 to 40% and mild mitral insufficiency who presented to the emergency room with complaints of progressive shortness of breath.  Was found to be hypoxic on arrival.  Chest imaging significant for interstitial edema.  Patient required initiation of NIPPV therapy for symptoms and refractory fluid overload.  He seemed to respond to this therapy quite well.  He was awake and alert on my arrival.  He answers all questions appropriately with intact mentation.  12/23: Required BiPAP overnight again.  When weaned off easily remained stable and low to mid 90s however has persistent feelings of dyspnea.  Net -1 L since admission.  Diuresis limited by chronic kidney disease.   Assessment & Plan:   Principal Problem:   Acute on chronic combined systolic and diastolic CHF (congestive heart failure) (HCC) Active Problems:   Acute respiratory failure with hypoxia (HCC)   Essential hypertension   COPD with acute exacerbation (HCC)   Hyperglycemia due to type 2 diabetes mellitus ([place])   History of CVA (cerebrovascular accident)   Elevated troponin   CKD (chronic kidney disease), stage IV (HCC)  Acute on chronic combined systolic & diastolic CHF  Acute hypoxic respiratory failure  secondary to above - Presents with progressive SOB over 24 hours, was saturating 80% with EMS and had increased WOB, and found in ED to have interstitial edema on CXR   - EF was 35-40% with grade 1 diastolic dysfunction in February 2021  - Treated with IV Lasix and BiPAP in ED  -Continues to require nocturnal BiPAP Plan: Continue IV diuresis Lasix 40 mg every 12 hours Add metolazone 2.5 mg daily Monitor renal function electrolytes carefully Daily weights Strict I's and O's As needed BiPAP Cardiology consult requested.  Message  sent to Dr. Nehemiah Massed.   Patient known to Dr. Bartholome Bill  COPD  Does not appear acutely exacerbated.  Respiratory failure likely driven by congestive heart failure.  Continue Combivent per home regimen  Elevated troponin  - HS troponin elevated to 55 then 54 without chest pain  - Low suspicion for ACS, likely secondary to acute CHF  - Treat CHF as above, continue statin and nitrates   CKD IV  - SCr is 2.06 on admission, close to apparent baseline  -Limiting effective diuresis Plan: Continue Lasix as above Metolazone as above Daily renal function   Hypertension  - BP at goal, continue Norvasc and hydralazine    History of CVA  - Continue statin and Eliquis    Atrial fibrillation  - CHADS-VASc at least 41 (age x2, CVA x2, DM, CHF, HTN) - Continue Eliquis   Insulin-dependent DM  - A1c was 7.6% in February 2021  - Continue CBGs and insulin     DVT prophylaxis: Eliquis Code Status: Full Family Communication: None today.  Wife Bethena Roys [phone] on 10/24/2020 Disposition Plan:Status is: Inpatient  Remains inpatient appropriate because:Inpatient level of care appropriate due to severity of illness   Dispo: The patient is from: Home              Anticipated d/c is to: Home              Anticipated d/c date is: 2 days              Patient currently is not medically stable to d/c.  Consultants:   Cardiology-[facility]  Procedures:   None  Antimicrobials:   None   Subjective: Seen and examined.  Continues to endorse shortness of breath.  No pain complaints  Objective: Vitals:   10/25/20 0430 10/25/20 0618 10/25/20 0800 10/25/20 1030  BP: (!) 143/53 (!) 125/54 (!) 143/77 (!) 123/59  Pulse:   60 69  Resp: 16  18   Temp:   98.4 F (36.9 C)   TempSrc:   Oral   SpO2: 99%  100% 94%  Weight:      Height:        Intake/Output Summary (Last 24 hours) at 10/25/2020 1137 Last data filed at 10/24/2020 1630 Gross per 24 hour  Intake --   Output 800 ml  Net -800 ml   Filed Weights   10/23/20 2307  Weight: 77.1 kg    Examination:  General exam: Appears calm and comfortable  Respiratory system: Bilateral crackles.  Normal work of breathing.  BiPAP Cardiovascular system: S1 & S2 heard, RRR. No JVD, murmurs, rubs, gallops or clicks. No pedal edema. Gastrointestinal system: Abdomen is nondistended, soft and nontender. No organomegaly or masses felt. Normal bowel sounds heard. Central nervous system: Alert and oriented. No focal neurological deficits. Extremities: Symmetric 5 x 5 power. Skin: No rashes, lesions or ulcers Psychiatry: Judgement and insight appear normal. Mood & affect appropriate.     Data Reviewed: I have personally reviewed following labs and imaging studies  CBC: Recent Labs  Lab 10/23/20 2315 10/24/20 0534 10/25/20 0411  WBC 12.7* 9.3 13.9*  NEUTROABS 10.4*  --   --   HGB 13.4 12.6* 11.8*  HCT 43.3 39.6 38.1*  MCV 79.9* 79.0* 80.0  PLT 282 248 458   Basic Metabolic Panel: Recent Labs  Lab 10/23/20 2315 10/24/20 0534 10/25/20 0411  NA 137 135 137  K 3.9 4.3 4.2  CL 94* 97* 98  CO2 30 28 31   GLUCOSE 209* 260* 143*  BUN 30* 34* 44*  CREATININE 2.06* 1.96* 2.05*  CALCIUM 9.1 8.9 8.7*  MG 2.4 2.7*  --    GFR: Estimated Creatinine Clearance: 30.4 mL/min (A) (by C-G formula based on SCr of 2.05 mg/dL (H)). Liver Function Tests: Recent Labs  Lab 10/23/20 2315  AST 17  ALT 15  ALKPHOS 118  BILITOT 0.8  PROT 7.7  ALBUMIN 3.9   No results for input(s): LIPASE, AMYLASE in the last 168 hours. No results for input(s): AMMONIA in the last 168 hours. Coagulation Profile: No results for input(s): INR, PROTIME in the last 168 hours. Cardiac Enzymes: No results for input(s): CKTOTAL, CKMB, CKMBINDEX, TROPONINI in the last 168 hours. BNP (last 3 results) No results for input(s): PROBNP in the last 8760 hours. HbA1C: Recent Labs    10/24/20 0141 10/24/20 0534  HGBA1C 7.3* 7.6*    CBG: Recent Labs  Lab 10/24/20 1413 10/24/20 1803 10/25/20 0026 10/25/20 0409 10/25/20 0758  GLUCAP 308* 293* 301* 152* 110*   Lipid Profile: No results for input(s): CHOL, HDL, LDLCALC, TRIG, CHOLHDL, LDLDIRECT in the last 72 hours. Thyroid Function Tests: No results for input(s): TSH, T4TOTAL, FREET4, T3FREE, THYROIDAB in the last 72 hours. Anemia Panel: No results for input(s): VITAMINB12, FOLATE, FERRITIN, TIBC, IRON, RETICCTPCT in the last 72 hours. Sepsis Labs: Recent Labs  Lab 10/23/20 2315  PROCALCITON 0.17    Recent Results (from the past 240 hour(s))  Resp Panel by RT-PCR (Flu A&B, Covid) Nasopharyngeal Swab     Status: None   Collection  Time: 10/23/20 11:33 PM   Specimen: Nasopharyngeal Swab; Nasopharyngeal(NP) swabs in vial transport medium  Result Value Ref Range Status   SARS Coronavirus 2 by RT PCR NEGATIVE NEGATIVE Final    Comment: (NOTE) SARS-CoV-2 target nucleic acids are NOT DETECTED.  The SARS-CoV-2 RNA is generally detectable in upper respiratory specimens during the acute phase of infection. The lowest concentration of SARS-CoV-2 viral copies this assay can detect is 138 copies/mL. A negative result does not preclude SARS-Cov-2 infection and should not be used as the sole basis for treatment or other patient management decisions. A negative result may occur with  improper specimen collection/handling, submission of specimen other than nasopharyngeal swab, presence of viral mutation(s) within the areas targeted by this assay, and inadequate number of viral copies(<138 copies/mL). A negative result must be combined with clinical observations, patient history, and epidemiological information. The expected result is Negative.  Fact Sheet for Patients:  EntrepreneurPulse.com.au  Fact Sheet for Healthcare Providers:  IncredibleEmployment.be  This test is no t yet approved or cleared by the [place] FDA and   has been authorized for detection and/or diagnosis of SARS-CoV-2 by FDA under an Emergency Use Authorization (EUA). This EUA will remain  in effect (meaning this test can be used) for the duration of the COVID-19 declaration under Section 564(b)(1) of the Act, 21 U.S.C.section 360bbb-3(b)(1), unless the authorization is terminated  or revoked sooner.       Influenza A by PCR NEGATIVE NEGATIVE Final   Influenza B by PCR NEGATIVE NEGATIVE Final    Comment: (NOTE) The Xpert Xpress SARS-CoV-2/FLU/RSV plus assay is intended as an aid in the diagnosis of influenza from Nasopharyngeal swab specimens and should not be used as a sole basis for treatment. Nasal washings and aspirates are unacceptable for Xpert Xpress SARS-CoV-2/FLU/RSV testing.  Fact Sheet for Patients: EntrepreneurPulse.com.au  Fact Sheet for Healthcare Providers: IncredibleEmployment.be  This test is not yet approved or cleared by the [place] FDA and has been authorized for detection and/or diagnosis of SARS-CoV-2 by FDA under an Emergency Use Authorization (EUA). This EUA will remain in effect (meaning this test can be used) for the duration of the COVID-19 declaration under Section 564(b)(1) of the Act, 21 U.S.C. section 360bbb-3(b)(1), unless the authorization is terminated or revoked.  Performed at [facility], [address] [place], [place] [address]          Radiology Studies: DG Chest 1 View  Result Date: 10/23/2020 CLINICAL DATA:  Shortness of breath. EXAM: CHEST  1 VIEW COMPARISON:  June 02, 2020 FINDINGS: Multiple sternal wires and vascular clips are seen. Mild to moderate severity increased interstitial lung markings are seen. There is a small left pleural effusion. No pneumothorax is identified. The heart size and mediastinal contours are within normal limits. Degenerative changes seen throughout the thoracic spine. IMPRESSION: 1. Evidence of  prior median sternotomy/CABG. 2. Mild to moderate severity interstitial edema. Electronically Signed   By: Virgina Norfolk M.D.   On: 10/23/2020 23:19        Scheduled Meds: . amLODipine  5 mg Oral Daily  . apixaban  5 mg Oral BID  . furosemide  40 mg Intravenous BID  . gabapentin  100 mg Oral BID  . hydrALAZINE  50 mg Oral Q8H  . insulin aspart  0-9 Units Subcutaneous Q4H  . insulin glargine  20 Units Subcutaneous Daily  . isosorbide mononitrate  30 mg Oral Daily  . pantoprazole  40 mg Oral Daily  .  simvastatin  20 mg Oral q1800  . traZODone  50 mg Oral QHS   Continuous Infusions:   LOS: 1 day    Time spent: 25 minutes    Sidney Ace, MD [facility] Pager [phone]  If 7PM-7AM, please contact night-coverage 10/25/2020, 11:37 AM

## 2020-10-25 NOTE — ED Notes (Signed)
Pt feeling SHOB and increased to 5L .  Pt saturation at 93% but patient is insisting on being placed back on bipap.  Pt placed on bipap for work of breathing.

## 2020-10-25 NOTE — Progress Notes (Signed)
Pt placed on HFNC for comfort to wean off bipap. Patient tolerating well at this time. Will continue to monitor.

## 2020-10-25 NOTE — Consult Note (Signed)
[facility] Consultation Note  Patient ID: [name], MRN: [id number], DOB/AGE: 03/16/1946 74 y.o. Admit date: 10/23/2020   Date of Consult: 10/25/2020 Primary Physician: [name], MD Primary Cardiologist: Ubaldo Glassing  Chief Complaint:  Chief Complaint  Patient presents with  . Respiratory Distress   Reason for Consult: Heart failure  HPI: 74 y.o. male with known coronary disease status post coronary bypass graft moderate LV systolic dysfunction by echocardiogram with ejection fraction of 35% diabetes hypertension hyperlipidemia chronic kidney disease stage III with paroxysmal nonvalvular atrial fibrillation and frequent preventricular contractions.  The patient has had a significant increase in evidence of lower extremity edema severe shortness of breath PND and orthopnea.  The patient was seen in the emergency room due to these issues and was found to be hypoxic.  The patient did have a chest x-ray also showing congestive heart failure and pulmonary edema with a troponin of 55 BNP of 1001 a glomerular filtration rate of 33.  EKG shows normal sinus rhythm with frequent preventricular contractions and poor R wave progression.  Overall the patient was placed on a BiPAP which is slowly improving the patient's symptoms and he feels slightly better.  He has had some urine output with treatment with Lasix.  Currently there is no evidence of chest discomfort or acute coronary syndrome  Past Medical History:  Diagnosis Date  . Arrhythmia    atrial fibrillation  . Basal cell carcinoma 05/2019   right nasal ala, Tx: [facility]  . Basal cell carcinoma 04/12/2019   Left posterior ear. Nodular pattern, excoriated.   . CHF (congestive heart failure) ([place])   . COPD (chronic obstructive pulmonary disease) ([place])   . Coronary artery disease   . Diabetes mellitus without complication ([place])   . GERD (gastroesophageal reflux disease)   . HLD (hyperlipidemia)   . Hypertension   . Peripheral  vascular disease ([place])   . Pneumonia   . Prostate cancer ([place])   . Squamous cell carcinoma of skin 08/10/2018   Left upper arm above elbow. [facility].  Marland Kitchen Squamous cell carcinoma of skin 09/07/2018   Right forearm, below elbow. [facility]. [facility] 01/10/2019.  Marland Kitchen Stroke [facility])       Surgical History:  Past Surgical History:  Procedure Laterality Date  . COLONOSCOPY    . CORONARY ARTERY BYPASS GRAFT  2006  . EYE SURGERY    . GOLD SEED IMPLANT N/A 12/30/2016   Procedure: GOLD SEED IMPLANT  x3;  Surgeon: Hollice Espy, MD;  Location: [facility] ORS;  Service: Urology;  Laterality: N/A;  . HEMORRHOID SURGERY N/A 10/02/2016   Procedure: PROCTOSCOPY AND CONTROL OF RECTAL BLEEDING;  Surgeon: Excell Seltzer, MD;  Location: [facility] ORS;  Service: General;  Laterality: N/A;  . PROSTATE BIOPSY    . TONSILLECTOMY       Home Meds: Prior to Admission medications   Medication Sig Start Date End Date Taking? Authorizing Provider  acetaminophen (TYLENOL) 325 MG tablet Take 2 tablets (650 mg total) by mouth every 6 (six) hours as needed for mild pain (or Fever >/= 101). 01/20/16  Yes Gouru, Illene Silver, MD  albuterol (PROVENTIL HFA;VENTOLIN HFA) 108 (90 Base) MCG/ACT inhaler Inhale 2 puffs into the lungs every 6 (six) hours as needed for wheezing or shortness of breath.   Yes [provider]  amLODipine (NORVASC) 5 MG tablet Take 1 tablet (5 mg total) by mouth daily. 12/30/19  Yes Nicole Kindred A, DO  apixaban (ELIQUIS) 5 MG TABS tablet Take 1 tablet (  5 mg total) by mouth 2 (two) times daily. 03/12/20  Yes Hackney, Tina A, FNP  Apoaequorin (PREVAGEN PO) Take by mouth.   Yes [provider]  bifidobacterium infantis (ALIGN) capsule Take 1 capsule by mouth every morning.   Yes [provider]  cholecalciferol (VITAMIN D) 1000 units tablet Take 1,000 Units by mouth daily.   Yes [provider]  Cinnamon 500 MG capsule Take 1,000 mg by mouth 2 (two) times daily.   Yes [provider]   COMBIVENT RESPIMAT 20-100 MCG/ACT AERS respimat Inhale 2 puffs into the lungs 4 (four) times daily as needed. 09/18/20  Yes [provider]  CRANBERRY EXTRACT PO Take 500 mg by mouth every morning.    Yes [provider]  Ensure Max Protein (ENSURE MAX PROTEIN) LIQD Take 330 mLs (11 oz total) by mouth 2 (two) times daily between meals. 12/29/19  Yes Nicole Kindred A, DO  gabapentin (NEURONTIN) 100 MG capsule Take 100 mg by mouth 2 (two) times daily.   Yes [provider]  hydrALAZINE (APRESOLINE) 50 MG tablet Take 1 tablet (50 mg total) by mouth every 8 (eight) hours. 10/15/19  Yes Ghimire, Henreitta Leber, MD  insulin aspart (NOVOLOG) 100 UNIT/ML injection 0-9 Units, Subcutaneous, 3 times daily with meals CBG < 70: Implement Hypoglycemia protocol/measures CBG 70 - 120: 0 units CBG 121 - 150: 1 unit CBG 151 - 200: 2 units CBG 201 - 250: 3 units CBG 251 - 300: 5 units CBG 301 - 350: 7 units CBG 351 - 400: 9 units CBG > 400: call MD Patient taking differently: Inject 5 Units into the skin 3 (three) times daily with meals. 10/15/19  Yes Ghimire, Henreitta Leber, MD  isosorbide mononitrate (IMDUR) 30 MG 24 hr tablet Take 1 tablet (30 mg total) by mouth daily. 10/15/19  Yes Ghimire, Henreitta Leber, MD  lactase (LACTAID) 3000 units tablet Take 3,000 Units by mouth 3 (three) times daily between meals as needed.   Yes [provider]  LANTUS SOLOSTAR 100 UNIT/ML Solostar Pen Inject 45 Units into the skin daily at 10 pm. 10/15/19  Yes Ghimire, Henreitta Leber, MD  Magnesium 250 MG TABS Take 250 mg by mouth daily.   Yes [provider]  Misc Natural Products (TOTAL MEMORY & FOCUS FORMULA PO) Take 1 capsule by mouth See admin instructions. Take 2 capsule at 9:00 AM, 1 capsule at 5:00 PM, and 1 capsule at bedtime.   Yes [provider]  Multiple Vitamin (MULTIVITAMIN WITH MINERALS) TABS tablet Take 1 tablet by mouth daily.   Yes [provider]  NON FORMULARY Take  1 Dose by mouth 3 (three) times daily. Relief Factor OTC   Yes [provider]  omeprazole (PRILOSEC) 20 MG capsule Take 20 mg by mouth daily.   Yes [provider]  potassium chloride (K-DUR) 10 MEQ tablet Take 10 mEq by mouth daily.   Yes [provider]  simvastatin (ZOCOR) 20 MG tablet Take 20 mg by mouth at bedtime.   Yes [provider]  SUPER B COMPLEX/C PO Take 1 tablet by mouth every morning.   Yes [provider]  torsemide (DEMADEX) 20 MG tablet Take 40 mg by mouth 2 (two) times daily.   Yes [provider]  traZODone (DESYREL) 50 MG tablet Take 50 mg by mouth at bedtime.   Yes [provider]  vitamin C (ASCORBIC ACID) 500 MG tablet Take 500 mg by mouth 2 (two) times daily.  Yes [provider]  aspirin EC 81 MG tablet Take 81 mg by mouth daily. Patient not taking: Reported on 10/24/2020    [provider]  glucose 4 GM chewable tablet Chew 1 tablet by mouth as needed for low blood sugar.    [provider]  ketoconazole (NIZORAL) 2 % cream Apply to the feet and between the toes BID Patient not taking: No sig reported 07/18/20   Brendolyn Patty, MD  metoprolol tartrate (LOPRESSOR) 25 MG tablet Take 0.5 tablets (12.5 mg total) by mouth 2 (two) times daily. Patient not taking: No sig reported 12/29/19   Ezekiel Slocumb, DO    Inpatient Medications:  . amLODipine  5 mg Oral Daily  . apixaban  5 mg Oral BID  . furosemide  40 mg Intravenous BID  . gabapentin  100 mg Oral BID  . hydrALAZINE  50 mg Oral Q8H  . insulin aspart  0-9 Units Subcutaneous Q4H  . insulin glargine  20 Units Subcutaneous Daily  . isosorbide mononitrate  30 mg Oral Daily  . metolazone  2.5 mg Oral Daily  . pantoprazole  40 mg Oral Daily  . simvastatin  20 mg Oral q1800  . traZODone  50 mg Oral QHS     Allergies:  Allergies  Allergen Reactions  . Lactose Intolerance (Gi)     Social History   Socioeconomic  History  . Marital status: Married    Spouse name: Not on file  . Number of children: Not on file  . Years of education: Not on file  . Highest education level: Not on file  Occupational History  . Not on file  Tobacco Use  . Smoking status: Current Every Day Smoker    Packs/day: 1.00    Years: 54.00    Pack years: 54.00    Types: Cigarettes  . Smokeless tobacco: Never Used  Vaping Use  . Vaping Use: Never used  Substance and Sexual Activity  . Alcohol use: No    Alcohol/week: 0.0 standard drinks  . Drug use: No  . Sexual activity: Yes    Birth control/protection: None    Comment: Married  Other Topics Concern  . Not on file  Social History Narrative  . Not on file   Social Determinants of Health   Financial Resource Strain: Not on file  Food Insecurity: Not on file  Transportation Needs: Not on file  Physical Activity: Not on file  Stress: Not on file  Social Connections: Not on file  Intimate Partner Violence: Not on file     Family History  Problem Relation Age of Onset  . Lung cancer Mother   . Heart attack Father   . Hypertension Other   . Cancer Maternal Grandfather        prostate  . Cancer Paternal Grandmother        breast     Review of Systems Positive for shortness of breath Negative for: General:  chills, fever, night sweats or weight changes.  Cardiovascular: Positive for PND orthopnea negative for syncope dizziness  Dermatological skin lesions rashes Respiratory: Cough congestion Urologic: Frequent urination urination at night and hematuria Abdominal: negative for nausea, vomiting, diarrhea, bright red blood per rectum, melena, or hematemesis Neurologic: negative for visual changes, and/or hearing changes  All other systems reviewed and are otherwise negative except as noted above.  Labs: No results for input(s): CKTOTAL, CKMB, TROPONINI in the last 72 hours. Lab Results  Component Value Date   WBC  13.9 (H) 10/25/2020   HGB 11.8 (L)  10/25/2020   HCT 38.1 (L) 10/25/2020   MCV 80.0 10/25/2020   PLT 239 10/25/2020    Recent Labs  Lab 10/23/20 2315 10/24/20 0534 10/25/20 0411  NA 137   < > 137  K 3.9   < > 4.2  CL 94*   < > 98  CO2 30   < > 31  BUN 30*   < > 44*  CREATININE 2.06*   < > 2.05*  CALCIUM 9.1   < > 8.7*  PROT 7.7  --   --   BILITOT 0.8  --   --   ALKPHOS 118  --   --   ALT 15  --   --   AST 17  --   --   GLUCOSE 209*   < > 143*   < > = values in this interval not displayed.   No results found for: CHOL, HDL, LDLCALC, TRIG Lab Results  Component Value Date   DDIMER 0.44 10/15/2019    Radiology/Studies:  DG Chest 1 View  Result Date: 10/23/2020 CLINICAL DATA:  Shortness of breath. EXAM: CHEST  1 VIEW COMPARISON:  June 02, 2020 FINDINGS: Multiple sternal wires and vascular clips are seen. Mild to moderate severity increased interstitial lung markings are seen. There is a small left pleural effusion. No pneumothorax is identified. The heart size and mediastinal contours are within normal limits. Degenerative changes seen throughout the thoracic spine. IMPRESSION: 1. Evidence of prior median sternotomy/CABG. 2. Mild to moderate severity interstitial edema. Electronically Signed   By: Virgina Norfolk M.D.   On: 10/23/2020 23:19   DG Chest Port 1 View  Result Date: 10/25/2020 CLINICAL DATA:  Respiratory failure EXAM: PORTABLE CHEST 1 VIEW COMPARISON:  October 23, 2020 FINDINGS: There is diffuse interstitial thickening. No edema or consolidation. There is a small left pleural effusion. Heart is borderline enlarged with pulmonary venous hypertension. Status post coronary artery bypass grafting. There is aortic atherosclerosis. No adenopathy. No bone lesions. IMPRESSION: Borderline cardiac enlargement with a degree of pulmonary vascular congestion. Status post coronary artery bypass grafting. Small left pleural effusion. Interstitium is thickened, likely due to chronic bronchitis with questionable mild  chronic interstitial edema. No consolidation. There may be a degree of chronic congestive heart failure. Aortic Atherosclerosis (ICD10-I70.0). Electronically Signed   By: Lowella Grip III M.D.   On: 10/25/2020 11:54    EKG: Sinus bradycardia with frequent preventricular contractions and preatrial contractions with poor R wave progression  Weights: Filed Weights   10/23/20 2307  Weight: 77.1 kg     Physical Exam: Blood pressure 129/77, pulse 90, temperature 98.4 F (36.9 C), temperature source Oral, resp. rate (!) 23, height 5\' 6"  (1.676 m), weight 77.1 kg, SpO2 94 %. Body mass index is 27.44 kg/m. General: Well developed, well nourished, in no acute distress. Head eyes ears nose throat: Normocephalic, atraumatic, sclera non-icteric, no xanthomas, nares are without discharge. No apparent thyromegaly and/or mass  Lungs: Normal respiratory effort.  Some wheezes, bibasilar rales, no rhonchi.  Heart: Irregular with normal S1 S2. no murmur gallop, no rub, PMI is normal size and placement, carotid upstroke normal without bruit, jugular venous pressure is normal Abdomen: Soft, non-tender, non-distended with normoactive bowel sounds. No hepatomegaly. No rebound/guarding. No obvious abdominal masses. Abdominal aorta is normal size without bruit Extremities: 1+ edema. no cyanosis, no clubbing, no ulcers  Peripheral : 2+ bilateral upper extremity pulses, 2+ bilateral femoral pulses, 2+  bilateral dorsal pedal pulse Neuro: Alert and oriented. No facial asymmetry. No focal deficit. Moves all extremities spontaneously. Musculoskeletal: Normal muscle tone without kyphosis Psych:  Responds to questions appropriately with a normal affect.    Assessment: 74 year old male with acute on chronic systolic dysfunction congestive heart failure known coronary artery disease hypertension hyperlipidemia chronic kidney disease and LV systolic dysfunction with paroxysmal nonvalvular atrial fibrillation without  current evidence of myocardial infarction and/or acute coronary syndrome  Plan: 1.  Furosemide intravenously at 40 mg twice per day for acute on chronic systolic dysfunction congestive heart failure watching closely for chronic kidney disease worsening 2.  Echocardiogram for further evaluation question of significance of LV systolic dysfunction and changes thereof necessary 3.  Avoidance of beta-blocker at this time due to patient's bradycardia and frequent preventricular contractions 4.  Okay For continuation of other risk factor modification medications including amlodipine for hypertension, isosorbide and hydralazine for congestive heart failure and or for angina, and simvastatin for hyperlipidemia 5.  BiPAP for now due to significant hypoxia and shortness of breath which is has slowly improved his symptoms 6.  Further treatment options after above 5. Signed, Corey Skains M.D. [facility] 10/25/2020, 12:02 PM

## 2020-10-26 DIAGNOSIS — I5043 Acute on chronic combined systolic (congestive) and diastolic (congestive) heart failure: Secondary | ICD-10-CM | POA: Diagnosis not present

## 2020-10-26 LAB — CBC
HCT: 39.1 % (ref 39.0–52.0)
Hemoglobin: 12.2 g/dL — ABNORMAL LOW (ref 13.0–17.0)
MCH: 24.9 pg — ABNORMAL LOW (ref 26.0–34.0)
MCHC: 31.2 g/dL (ref 30.0–36.0)
MCV: 80 fL (ref 80.0–100.0)
Platelets: 253 10*3/uL (ref 150–400)
RBC: 4.89 MIL/uL (ref 4.22–5.81)
RDW: 17.5 % — ABNORMAL HIGH (ref 11.5–15.5)
WBC: 12.4 10*3/uL — ABNORMAL HIGH (ref 4.0–10.5)
nRBC: 0 % (ref 0.0–0.2)

## 2020-10-26 LAB — GLUCOSE, CAPILLARY
Glucose-Capillary: 107 mg/dL — ABNORMAL HIGH (ref 70–99)
Glucose-Capillary: 136 mg/dL — ABNORMAL HIGH (ref 70–99)
Glucose-Capillary: 141 mg/dL — ABNORMAL HIGH (ref 70–99)
Glucose-Capillary: 152 mg/dL — ABNORMAL HIGH (ref 70–99)
Glucose-Capillary: 163 mg/dL — ABNORMAL HIGH (ref 70–99)
Glucose-Capillary: 175 mg/dL — ABNORMAL HIGH (ref 70–99)
Glucose-Capillary: 219 mg/dL — ABNORMAL HIGH (ref 70–99)

## 2020-10-26 LAB — BASIC METABOLIC PANEL
Anion gap: 9 (ref 5–15)
BUN: 49 mg/dL — ABNORMAL HIGH (ref 8–23)
CO2: 33 mmol/L — ABNORMAL HIGH (ref 22–32)
Calcium: 8.6 mg/dL — ABNORMAL LOW (ref 8.9–10.3)
Chloride: 95 mmol/L — ABNORMAL LOW (ref 98–111)
Creatinine, Ser: 2.04 mg/dL — ABNORMAL HIGH (ref 0.61–1.24)
GFR, Estimated: 33 mL/min — ABNORMAL LOW (ref >60)
Glucose, Bld: 117 mg/dL — ABNORMAL HIGH (ref 70–99)
Potassium: 3.8 mmol/L (ref 3.5–5.1)
Sodium: 137 mmol/L (ref 135–145)

## 2020-10-26 NOTE — Plan of Care (Signed)
  Problem: Education: Goal: Knowledge of General Education information will improve Description: Including pain rating scale, medication(s)/side effects and non-pharmacologic comfort measures Outcome: Not Progressing   Problem: Education: Goal: Knowledge of General Education information will improve Description: Including pain rating scale, medication(s)/side effects and non-pharmacologic comfort measures Outcome: Not Progressing   Problem: Education: Goal: Knowledge of General Education information will improve Description: Including pain rating scale, medication(s)/side effects and non-pharmacologic comfort measures Outcome: Not Progressing

## 2020-10-26 NOTE — Progress Notes (Signed)
PROGRESS NOTE    [name]  DPO:[id number] DOB: May 21, 1946 DOA: 10/23/2020 PCP: [name], MD   Brief Narrative:  74 year old male with known heart failure with reduced ejection fraction 35 to 40% and mild mitral insufficiency who presented to the emergency room with complaints of progressive shortness of breath.  Was found to be hypoxic on arrival.  Chest imaging significant for interstitial edema.  Patient required initiation of NIPPV therapy for symptoms and refractory fluid overload.  He seemed to respond to this therapy quite well.  He was awake and alert on my arrival.  He answers all questions appropriately with intact mentation.  12/23: Required BiPAP overnight again.  When weaned off easily remained stable and low to mid 90s however has persistent feelings of dyspnea.  Net -1 L since admission.  Diuresis limited by chronic kidney disease.  12/24: Respiratory status slowly improving.  Continues to require nocturnal BiPAP.  This morning he is on 8 L high flow nasal cannula.  Mentating clearly.  Frustrated by continued hospital stay.   Assessment & Plan:   Principal Problem:   Acute on chronic combined systolic and diastolic CHF (congestive heart failure) (HCC) Active Problems:   Acute respiratory failure with hypoxia (HCC)   Essential hypertension   COPD with acute exacerbation (HCC)   Hyperglycemia due to type 2 diabetes mellitus ([place])   History of CVA (cerebrovascular accident)   Elevated troponin   CKD (chronic kidney disease), stage IV (HCC)  Acute on chronic combined systolic & diastolic CHF  Acute hypoxic respiratory failure  secondary to above - Presents with progressive SOB over 24 hours, was saturating 80% with EMS and had increased WOB, and found in ED to have interstitial edema on CXR   - EF was 35-40% with grade 1 diastolic dysfunction in February 2021  -Treated with IV Lasix and BiPAP in ED  -Continues to require nocturnal BiPAP -Patient net -3.1 L since  admission Plan: Continue Lasix IV 40 mg every 12 hours Continue Zaroxolyn 2.5 mg daily Monitor renal function electrolytes carefully Daily weights Strict I's and O's Continue nightly BiPAP  COPD  Does not appear acutely exacerbated.  Respiratory failure likely driven by congestive heart failure.  Continue Combivent per home regimen  Elevated troponin  - HS troponin elevated to 55 then 54 without chest pain  - Low suspicion for ACS, likely secondary to acute CHF  - Treat CHF as above, continue statin and nitrates   CKD IV  - SCr is 2.06 on admission, close to apparent baseline  -Limiting effective diuresis Plan: Continue Lasix as above Metolazone as above Daily renal function   Hypertension  - BP at goal, continue Norvasc and hydralazine    History of CVA  - Continue statin and Eliquis    Atrial fibrillation  - CHADS-VASc at least 75 (age x2, CVA x2, DM, CHF, HTN) - Continue Eliquis   Insulin-dependent DM  - A1c was 7.6% in February 2021  - Continue CBGs and insulin     DVT prophylaxis: Eliquis Code Status: Full Family Communication: Wife Bethena Roys [phone] on 10/26/2020 Disposition Plan:Status is: Inpatient  Remains inpatient appropriate because:Inpatient level of care appropriate due to severity of illness   Dispo: The patient is from: Home              Anticipated d/c is to: Home              Anticipated d/c date is: 1 day  Patient currently is not medically stable to d/c.   Stil hypoxic requiring 8 L high flow nasal cannula.  Not medically ready for discharge yet.  Possibly within 24 to 48 hours      Consultants:   Cardiology-[facility]  Procedures:   None  Antimicrobials:   None   Subjective: Seen and examined.  Remains dependent on nocturnal BiPAP.  Shortness of breath slowly improving.  No pain complaints.  On 8 L high flow this morning  Objective: Vitals:   10/26/20 0429 10/26/20 0810 10/26/20 0916 10/26/20  1232  BP:  (!) 130/47  (!) 104/53  Pulse:  (!) 45  (!) 48  Resp:  19  18  Temp:  98.5 F (36.9 C)  97.7 F (36.5 C)  TempSrc:  Oral    SpO2: 98% 97% 98% 97%  Weight:      Height:        Intake/Output Summary (Last 24 hours) at 10/26/2020 1349 Last data filed at 10/26/2020 1230 Gross per 24 hour  Intake --  Output 2100 ml  Net -2100 ml   Filed Weights   10/23/20 2307 10/26/20 0409  Weight: 77.1 kg 76.6 kg    Examination:  General exam: Appears calm and comfortable  Respiratory system: Bilateral crackles.  Normal work of breathing.  BiPAP Cardiovascular system: S1 & S2 heard, RRR. No JVD, murmurs, rubs, gallops or clicks. No pedal edema. Gastrointestinal system: Abdomen is nondistended, soft and nontender. No organomegaly or masses felt. Normal bowel sounds heard. Central nervous system: Alert and oriented. No focal neurological deficits. Extremities: Symmetric 5 x 5 power. Skin: No rashes, lesions or ulcers Psychiatry: Judgement and insight appear normal. Mood & affect appropriate.     Data Reviewed: I have personally reviewed following labs and imaging studies  CBC: Recent Labs  Lab 10/23/20 2315 10/24/20 0534 10/25/20 0411 10/26/20 0354  WBC 12.7* 9.3 13.9* 12.4*  NEUTROABS 10.4*  --   --   --   HGB 13.4 12.6* 11.8* 12.2*  HCT 43.3 39.6 38.1* 39.1  MCV 79.9* 79.0* 80.0 80.0  PLT 282 248 239 825   Basic Metabolic Panel: Recent Labs  Lab 10/23/20 2315 10/24/20 0534 10/25/20 0411 10/26/20 0354  NA 137 135 137 137  K 3.9 4.3 4.2 3.8  CL 94* 97* 98 95*  CO2 30 28 31  33*  GLUCOSE 209* 260* 143* 117*  BUN 30* 34* 44* 49*  CREATININE 2.06* 1.96* 2.05* 2.04*  CALCIUM 9.1 8.9 8.7* 8.6*  MG 2.4 2.7*  --   --    GFR: Estimated Creatinine Clearance: 30.5 mL/min (A) (by C-G formula based on SCr of 2.04 mg/dL (H)). Liver Function Tests: Recent Labs  Lab 10/23/20 2315  AST 17  ALT 15  ALKPHOS 118  BILITOT 0.8  PROT 7.7  ALBUMIN 3.9   No results for  input(s): LIPASE, AMYLASE in the last 168 hours. No results for input(s): AMMONIA in the last 168 hours. Coagulation Profile: No results for input(s): INR, PROTIME in the last 168 hours. Cardiac Enzymes: No results for input(s): CKTOTAL, CKMB, CKMBINDEX, TROPONINI in the last 168 hours. BNP (last 3 results) No results for input(s): PROBNP in the last 8760 hours. HbA1C: Recent Labs    10/24/20 0141 10/24/20 0534  HGBA1C 7.3* 7.6*   CBG: Recent Labs  Lab 10/25/20 1933 10/26/20 0015 10/26/20 0412 10/26/20 0800 10/26/20 1143  GLUCAP 189* 107* 136* 152* 141*   Lipid Profile: No results for input(s): CHOL, HDL, LDLCALC, TRIG,  CHOLHDL, LDLDIRECT in the last 72 hours. Thyroid Function Tests: No results for input(s): TSH, T4TOTAL, FREET4, T3FREE, THYROIDAB in the last 72 hours. Anemia Panel: No results for input(s): VITAMINB12, FOLATE, FERRITIN, TIBC, IRON, RETICCTPCT in the last 72 hours. Sepsis Labs: Recent Labs  Lab 10/23/20 2315  PROCALCITON 0.17    Recent Results (from the past 240 hour(s))  Resp Panel by RT-PCR (Flu A&B, Covid) Nasopharyngeal Swab     Status: None   Collection Time: 10/23/20 11:33 PM   Specimen: Nasopharyngeal Swab; Nasopharyngeal(NP) swabs in vial transport medium  Result Value Ref Range Status   SARS Coronavirus 2 by RT PCR NEGATIVE NEGATIVE Final    Comment: (NOTE) SARS-CoV-2 target nucleic acids are NOT DETECTED.  The SARS-CoV-2 RNA is generally detectable in upper respiratory specimens during the acute phase of infection. The lowest concentration of SARS-CoV-2 viral copies this assay can detect is 138 copies/mL. A negative result does not preclude SARS-Cov-2 infection and should not be used as the sole basis for treatment or other patient management decisions. A negative result may occur with  improper specimen collection/handling, submission of specimen other than nasopharyngeal swab, presence of viral mutation(s) within the areas targeted by  this assay, and inadequate number of viral copies(<138 copies/mL). A negative result must be combined with clinical observations, patient history, and epidemiological information. The expected result is Negative.  Fact Sheet for Patients:  EntrepreneurPulse.com.au  Fact Sheet for Healthcare Providers:  IncredibleEmployment.be  This test is no t yet approved or cleared by the [place] FDA and  has been authorized for detection and/or diagnosis of SARS-CoV-2 by FDA under an Emergency Use Authorization (EUA). This EUA will remain  in effect (meaning this test can be used) for the duration of the COVID-19 declaration under Section 564(b)(1) of the Act, 21 U.S.C.section 360bbb-3(b)(1), unless the authorization is terminated  or revoked sooner.       Influenza A by PCR NEGATIVE NEGATIVE Final   Influenza B by PCR NEGATIVE NEGATIVE Final    Comment: (NOTE) The Xpert Xpress SARS-CoV-2/FLU/RSV plus assay is intended as an aid in the diagnosis of influenza from Nasopharyngeal swab specimens and should not be used as a sole basis for treatment. Nasal washings and aspirates are unacceptable for Xpert Xpress SARS-CoV-2/FLU/RSV testing.  Fact Sheet for Patients: EntrepreneurPulse.com.au  Fact Sheet for Healthcare Providers: IncredibleEmployment.be  This test is not yet approved or cleared by the [place] FDA and has been authorized for detection and/or diagnosis of SARS-CoV-2 by FDA under an Emergency Use Authorization (EUA). This EUA will remain in effect (meaning this test can be used) for the duration of the COVID-19 declaration under Section 564(b)(1) of the Act, 21 U.S.C. section 360bbb-3(b)(1), unless the authorization is terminated or revoked.  Performed at [facility], [address], [place], [place] [address]          Radiology Studies: DG Chest [place] 1 View  Result Date:  10/25/2020 CLINICAL DATA:  Respiratory failure EXAM: PORTABLE CHEST 1 VIEW COMPARISON:  October 23, 2020 FINDINGS: There is diffuse interstitial thickening. No edema or consolidation. There is a small left pleural effusion. Heart is borderline enlarged with pulmonary venous hypertension. Status post coronary artery bypass grafting. There is aortic atherosclerosis. No adenopathy. No bone lesions. IMPRESSION: Borderline cardiac enlargement with a degree of pulmonary vascular congestion. Status post coronary artery bypass grafting. Small left pleural effusion. Interstitium is thickened, likely due to chronic bronchitis with questionable mild chronic interstitial edema. No consolidation. There may  be a degree of chronic congestive heart failure. Aortic Atherosclerosis (ICD10-I70.0). Electronically Signed   By: Lowella Grip III M.D.   On: 10/25/2020 11:54        Scheduled Meds: . amLODipine  5 mg Oral Daily  . apixaban  5 mg Oral BID  . furosemide  40 mg Intravenous BID  . gabapentin  100 mg Oral BID  . hydrALAZINE  50 mg Oral Q8H  . insulin aspart  0-9 Units Subcutaneous Q4H  . insulin glargine  20 Units Subcutaneous Daily  . isosorbide mononitrate  30 mg Oral Daily  . metolazone  2.5 mg Oral Daily  . pantoprazole  40 mg Oral Daily  . simvastatin  20 mg Oral q1800  . traZODone  50 mg Oral QHS   Continuous Infusions:   LOS: 2 days    Time spent: 15 minutes    Sidney Ace, MD [facility] Pager [phone]  If 7PM-7AM, please contact night-coverage 10/26/2020, 1:49 PM

## 2020-10-26 NOTE — Evaluation (Signed)
Physical Therapy Evaluation Patient Details Name: [name] MRN: [id number] DOB: 1946-01-30 Today's Date: 10/26/2020   History of Present Illness  [name] is a 74 y.o. male with a past medical history of A. fib on Eliquis, CHF with an EF estimate of 35 to 40%, COPD, ongoing tobacco abuse, CAD, DM, HTN, HDL, GERD, PVD, and prostate cancer not currently undergoing treatment presents EMS from home for assessment of some acute onset of shortness of breath.     Clinical Impression  Pt received in Semi-Fowler's position and agreeable to therapy.  Pt currently on 8L of O2 with HFNC.  Pt reports no pain upon arrival and is ready to begin therapy.  Pt able to perform bed-level exercises without much difficulty.  PT alos able to transition to seated EOB with supervision, not reuiring handrails for support.  Upon seated position he was instructed on how to perform LAQ's and was then able to perform STS with use of FWW.  Pt's SpO2 remained >96% throughout entire session.  Pt was able to perform standing balance assessment with good technique and has some balance deficits noted with rhomberg stance.  Pt requires UE assistance with FWW to gain positioning however is able to hold position with no UE support for ~8 secs with both legs being lead LE.  Pt then able to side step and perform standing marches with cues from therapist for upright posture due to kyphotic nature of spine.  Limited mobility due to HFNC length.  Nursing notified of stats upon entering the room and nursing lowered O2 content.  Pt was transferred back to supine position with modI.  Current recommendation is HHPT to assist with pt regaining strength to return to PLOF and increase mobility.  Pt will benefit from skilled PT intervention during hospital stay to increase independence and safety with basic mobility in preparation for discharge to the venue listed below.        Follow Up Recommendations Home health PT    Equipment  Recommendations  Rolling walker with 5" wheels;Other (comment) (Pt encouraged to utilize a FWW, but reports he has too much stuff in the house to have a larger AD to navigate in the house.)    Recommendations for Other Services       Precautions / Restrictions Precautions Precautions: None Restrictions Weight Bearing Restrictions: No      Mobility  Bed Mobility Overal bed mobility: Independent                  Transfers Overall transfer level: Modified independent Equipment used: Rolling walker (2 wheeled)                Ambulation/Gait Ambulation/Gait assistance: Min guard Gait Distance (Feet): 3 Feet Assistive device: Rolling walker (2 wheeled)       General Gait Details: Pt able to perform standing marching at bedside and take sidesteps the length of the bed due to HFNC in place.  Stairs            Wheelchair Mobility    Modified Rankin (Stroke Patients Only)       Balance Overall balance assessment: Needs assistance Sitting-balance support: Feet supported Sitting balance-Leahy Scale: Good     Standing balance support: No upper extremity supported Standing balance-Leahy Scale: Good       Tandem Stance - Right Leg: 8 Tandem Stance - Left Leg: 8  Pertinent Vitals/Pain      Home Living Family/patient expects to be discharged to:: Private residence Living Arrangements: Spouse/significant other Available Help at Discharge: Family Type of Home: House Home Access: Stairs to enter   Technical brewer of Steps: 2 Home Layout: One level Home Equipment: Shower seat;Cane - single point;Walker - 4 wheels;Wheelchair - manual      Prior Function Level of Independence: Needs assistance;Independent with assistive device(s)   Gait / Transfers Assistance Needed: MOD I with SPC but endorses >2 falls in one year.           Hand Dominance   Dominant Hand: Right    Extremity/Trunk Assessment   Upper  Extremity Assessment Upper Extremity Assessment: Generalized weakness    Lower Extremity Assessment Lower Extremity Assessment: Generalized weakness    Cervical / Trunk Assessment Cervical / Trunk Assessment: Kyphotic  Communication   Communication: No difficulties  Cognition Arousal/Alertness: Awake/alert Behavior During Therapy: WFL for tasks assessed/performed Overall Cognitive Status: Within Functional Limits for tasks assessed                                        General Comments      Exercises Total Joint Exercises Ankle Circles/Pumps: AROM;Strengthening;Both;10 reps;Supine Quad Sets: AROM;Strengthening;Both;10 reps;Supine Gluteal Sets: AROM;Strengthening;Both;10 reps;Supine Hip ABduction/ADduction: AROM;Strengthening;Both;10 reps;Supine Straight Leg Raises: AROM;Strengthening;Both;10 reps;Supine Long Arc Quad: AROM;Strengthening;Both;10 reps;Seated Marching in Standing: AROM;Strengthening;Both;10 reps;Standing Other Exercises Other Exercises: Pt educated on role of PT and services provided during hospital stay. Other Exercises: Pt educated on importance of exericsing throughout hospital stay and the physiological benefits of performing exercises while in bed.   Assessment/Plan    PT Assessment Patient needs continued PT services  PT Problem List Decreased strength;Decreased activity tolerance;Decreased balance;Decreased mobility       PT Treatment Interventions Gait training;Stair training;Functional mobility training;Therapeutic activities;Therapeutic exercise;Balance training    PT Goals (Current goals can be found in the Care Plan section)  Acute Rehab PT Goals Patient Stated Goal: to get stronger and go home. PT Goal Formulation: With patient Time For Goal Achievement: 11/09/20 Potential to Achieve Goals: Good    Frequency Min 2X/week   Barriers to discharge        Co-evaluation               AM-PAC PT "6 Clicks" Mobility   Outcome Measure Help needed turning from your back to your side while in a flat bed without using bedrails?: None Help needed moving from lying on your back to sitting on the side of a flat bed without using bedrails?: None Help needed moving to and from a bed to a chair (including a wheelchair)?: A Little Help needed standing up from a chair using your arms (e.g., wheelchair or bedside chair)?: A Little Help needed to walk in hospital room?: A Little Help needed climbing 3-5 steps with a railing? : A Lot 6 Click Score: 19    End of Session Equipment Utilized During Treatment: Gait belt Activity Tolerance: Patient tolerated treatment well Patient left: in bed;with call bell/phone within reach;with bed alarm set Nurse Communication: Mobility status PT Visit Diagnosis: Unsteadiness on feet (R26.81);Other abnormalities of gait and mobility (R26.89);Muscle weakness (generalized) (M62.81);Difficulty in walking, not elsewhere classified (R26.2)    Time: 2831-5176 PT Time Calculation (min) (ACUTE ONLY): 50 min   Charges:   PT Evaluation $PT Eval Low Complexity: 1 Low PT Treatments $Gait Training: 8-22  mins $Therapeutic Exercise: 23-37 mins        Gwenlyn Saran, PT, DPT 10/26/20, 2:14 PM

## 2020-10-26 NOTE — Progress Notes (Signed)
[facility] Cardiology [facility] Encounter Note  Patient: [name] / Admit Date: 10/23/2020 / Date of Encounter: 10/26/2020, 8:48 AM   Subjective: 12/24.  Patient claims that he is significantly improved from the last few days with significantly less lower extremity edema and shortness of breath.  His oxygenation has improved.  The patient overall has had telemetry showing normal sinus rhythm with bundle branch block with preventricular contractions.  The patient has had continued bibasilar Rales consistent with continued need for heart failure treatment.  Previous echocardiogram has shown ejection fraction of 35%.  There is no current evidence of acute coronary syndrome anginal symptoms at this time  Review of Systems: Positive for: Shortness of breath Negative for: Vision change, hearing change, syncope, dizziness, nausea, vomiting,diarrhea, bloody stool, stomach pain, cough, congestion, diaphoresis, urinary frequency, urinary pain,skin lesions, skin rashes Others previously listed  Objective: Telemetry: Normal sinus rhythm with bundle branch block and PVCs Physical Exam: Blood pressure (!) 130/47, pulse (!) 45, temperature 98.5 F (36.9 C), temperature source Oral, resp. rate 19, height 5\' 6"  (1.676 m), weight 76.6 kg, SpO2 97 %. Body mass index is 27.26 kg/m. General: Well developed, well nourished, in no acute distress. Head: Normocephalic, atraumatic, sclera non-icteric, no xanthomas, nares are without discharge. Neck: No apparent masses Lungs: Normal respirations with no wheezes, no rhonchi, basilar rales , basilar crackles   Heart: Irregular rate and rhythm, normal S1 S2, no murmur, no rub, no gallop, PMI is normal size and placement, carotid upstroke normal without bruit, jugular venous pressure normal Abdomen: Soft, non-tender, non-distended with normoactive bowel sounds. No hepatosplenomegaly. Abdominal aorta is normal size without bruit Extremities: Trace edema, no clubbing,  no cyanosis, no ulcers,  Peripheral: 2+ radial, 2+ femoral, 2+ dorsal pedal pulses Neuro: Alert and oriented. Moves all extremities spontaneously. Psych:  Responds to questions appropriately with a normal affect.   Intake/Output Summary (Last 24 hours) at 10/26/2020 0848 Last data filed at 10/26/2020 0500 Gross per 24 hour  Intake --  Output 1700 ml  Net -1700 ml    Inpatient Medications:  . amLODipine  5 mg Oral Daily  . apixaban  5 mg Oral BID  . furosemide  40 mg Intravenous BID  . gabapentin  100 mg Oral BID  . hydrALAZINE  50 mg Oral Q8H  . insulin aspart  0-9 Units Subcutaneous Q4H  . insulin glargine  20 Units Subcutaneous Daily  . isosorbide mononitrate  30 mg Oral Daily  . metolazone  2.5 mg Oral Daily  . pantoprazole  40 mg Oral Daily  . simvastatin  20 mg Oral q1800  . traZODone  50 mg Oral QHS   Infusions:   Labs: Recent Labs    10/23/20 2315 10/24/20 0534 10/25/20 0411 10/26/20 0354  NA 137 135 137 137  K 3.9 4.3 4.2 3.8  CL 94* 97* 98 95*  CO2 30 28 31  33*  GLUCOSE 209* 260* 143* 117*  BUN 30* 34* 44* 49*  CREATININE 2.06* 1.96* 2.05* 2.04*  CALCIUM 9.1 8.9 8.7* 8.6*  MG 2.4 2.7*  --   --    Recent Labs    10/23/20 2315  AST 17  ALT 15  ALKPHOS 118  BILITOT 0.8  PROT 7.7  ALBUMIN 3.9   Recent Labs    10/23/20 2315 10/24/20 0534 10/25/20 0411 10/26/20 0354  WBC 12.7*   < > 13.9* 12.4*  NEUTROABS 10.4*  --   --   --   HGB 13.4   < >  11.8* 12.2*  HCT 43.3   < > 38.1* 39.1  MCV 79.9*   < > 80.0 80.0  PLT 282   < > 239 253   < > = values in this interval not displayed.   No results for input(s): CKTOTAL, CKMB, TROPONINI in the last 72 hours. Invalid input(s): POCBNP Recent Labs    10/24/20 0534  HGBA1C 7.6*     Weights: Filed Weights   10/23/20 2307 10/26/20 0409  Weight: 77.1 kg 76.6 kg     Radiology/Studies:  DG Chest 1 View  Result Date: 10/23/2020 CLINICAL DATA:  Shortness of breath. EXAM: CHEST  1 VIEW COMPARISON:   June 02, 2020 FINDINGS: Multiple sternal wires and vascular clips are seen. Mild to moderate severity increased interstitial lung markings are seen. There is a small left pleural effusion. No pneumothorax is identified. The heart size and mediastinal contours are within normal limits. Degenerative changes seen throughout the thoracic spine. IMPRESSION: 1. Evidence of prior median sternotomy/CABG. 2. Mild to moderate severity interstitial edema. Electronically Signed   By: Virgina Norfolk M.D.   On: 10/23/2020 23:19   DG Chest Port 1 View  Result Date: 10/25/2020 CLINICAL DATA:  Respiratory failure EXAM: PORTABLE CHEST 1 VIEW COMPARISON:  October 23, 2020 FINDINGS: There is diffuse interstitial thickening. No edema or consolidation. There is a small left pleural effusion. Heart is borderline enlarged with pulmonary venous hypertension. Status post coronary artery bypass grafting. There is aortic atherosclerosis. No adenopathy. No bone lesions. IMPRESSION: Borderline cardiac enlargement with a degree of pulmonary vascular congestion. Status post coronary artery bypass grafting. Small left pleural effusion. Interstitium is thickened, likely due to chronic bronchitis with questionable mild chronic interstitial edema. No consolidation. There may be a degree of chronic congestive heart failure. Aortic Atherosclerosis (ICD10-I70.0). Electronically Signed   By: Lowella Grip III M.D.   On: 10/25/2020 11:54     Assessment and Recommendation  74 y.o. male with known coronary artery disease status post coronary bypass graft diabetes hypertension hyperlipidemia chronic kidney disease stage III with acute systolic dysfunction congestive heart failure and no current evidence of myocardial infarction and/or acute coronary syndrome 1.  Continuation of intravenous Lasix for acute systolic dysfunction congestive heart failure which appears to be improving at this time with no worsening chronic kidney disease 2.  No  additional medication management for heart rate control and maintenance of normal sinus rhythm with concerns of significant bradycardia 3.  Eliquis 5 mg twice per day for further risk reduction in stroke with atrial fibrillation 4.  Hydralazine isosorbide for congestive heart failure due to concerns of side effects of ACE inhibitor angiotensin receptor blocker for which patient cannot tolerate 5.  High intensity cholesterol therapy for coronary artery disease with simvastatin without change 6.  Begin ambulation and follow-up for improvements of symptoms and further adjustments of medication management  Signed, Serafina Royals M.D. FACC

## 2020-10-26 NOTE — Evaluation (Signed)
Occupational Therapy Evaluation Patient Details Name: [name] MRN: [id number] DOB: 01/10/1946 Today's Date: 10/26/2020    History of Present Illness [name] is a 74 y.o. male with a past medical history of A. fib on Eliquis, CHF with an EF estimate of 35 to 40%, COPD, ongoing tobacco abuse, CAD, DM, HTN, HDL, GERD, PVD, and prostate cancer not currently undergoing treatment presents EMS from home for assessment of some acute onset of shortness of breath.   Clinical Impression   Patient presenting with decreased I in self care, balance, functional mobility/transfers, endurance, and safety awareness. Pt weaned down to 4L via [place] this session and remains at 94% or higher for O2 saturation with RN notified.  Patient reports living at home with wife and mod I for self care tasks and functional mobility PTA.  Patient currently functioning at min A overall. Pt standing for ~ 8 minutes at sink with min A for balance for self care tasks. Pt with posterior bias if UEs unsupported.  Patient will benefit from acute OT to increase overall independence in the areas of ADLs, functional mobility, and safety awareness in order to safely discharge home with family.    Follow Up Recommendations  Home health OT;Supervision - Intermittent    Equipment Recommendations  3 in 1 bedside commode       Precautions / Restrictions Precautions Precautions: None Restrictions Weight Bearing Restrictions: No      Mobility Bed Mobility Overal bed mobility: Needs Assistance Bed Mobility: Supine to Sit;Sit to Supine     Supine to sit: Supervision Sit to supine: Min guard   General bed mobility comments: min cuing for technique and hand placement    Transfers Overall transfer level: Needs assistance Equipment used: 1 person hand held assist Transfers: Sit to/from [facility] Sit to Stand: Min assist Stand pivot transfers: Min assist            Balance Overall balance assessment:  Needs assistance Sitting-balance support: Feet supported Sitting balance-Leahy Scale: Good     Standing balance support: No upper extremity supported Standing balance-Leahy Scale: Fair Standing balance comment: assist secondary to posterior bias in standing     Tandem Stance - Right Leg: 8 Tandem Stance - Left Leg: 8          ADL either performed or assessed with clinical judgement   ADL Overall ADL's : Needs assistance/impaired     Grooming: Wash/dry hands;Wash/dry face;Oral care;Standing;Minimal assistance Grooming Details (indicate cue type and reason): posterior bias in standing                                     Vision Baseline Vision/History: Wears glasses Wears Glasses: At all times Patient Visual Report: No change from baseline              Pertinent Vitals/Pain Pain Assessment: No/denies pain     Hand Dominance Right   Extremity/Trunk Assessment Upper Extremity Assessment Upper Extremity Assessment: Generalized weakness   Lower Extremity Assessment Lower Extremity Assessment: Generalized weakness   Cervical / Trunk Assessment Cervical / Trunk Assessment: Kyphotic   Communication Communication Communication: No difficulties   Cognition Arousal/Alertness: Awake/alert Behavior During Therapy: WFL for tasks assessed/performed Overall Cognitive Status: Within Functional Limits for tasks assessed  General Comments: Pt is A &O x4. Plesant and agreeable      Exercises Total Joint Exercises Ankle Circles/Pumps: AROM;Strengthening;Both;10 reps;Supine Quad Sets: AROM;Strengthening;Both;10 reps;Supine Gluteal Sets: AROM;Strengthening;Both;10 reps;Supine Hip ABduction/ADduction: AROM;Strengthening;Both;10 reps;Supine Straight Leg Raises: AROM;Strengthening;Both;10 reps;Supine Long Arc Quad: AROM;Strengthening;Both;10 reps;Seated Marching in Standing: AROM;Strengthening;Both;10  reps;Standing Other Exercises Other Exercises: Pt educated on role of PT and services provided during hospital stay. Other Exercises: Pt educated on importance of exericsing throughout hospital stay and the physiological benefits of performing exercises while in bed.        Home Living Family/patient expects to be discharged to:: Private residence Living Arrangements: Spouse/significant other Available Help at Discharge: Family;Available PRN/intermittently Type of Home: House Home Access: Stairs to enter [employer] of Steps: 2   Home Layout: One level     Bathroom Shower/Tub: Teacher, early years/pre: Standard     Home Equipment: Shower seat;Cane - single point;Walker - 4 wheels;Wheelchair - manual          Prior Functioning/Environment Level of Independence: Needs assistance;Independent with assistive device(s)  Gait / Transfers Assistance Needed: MOD I with SPC but endorses >2 falls in one year. ADL's / Homemaking Assistance Needed: Pt reports he performs sink bathing or uses body wipes for hygiene. He tires quickly with ADL tasks   Comments: No home O2        OT Problem List: Decreased strength;Decreased activity tolerance;Decreased safety awareness;Impaired balance (sitting and/or standing);Decreased knowledge of precautions;Decreased knowledge of use of DME or AE;Cardiopulmonary status limiting activity      OT Treatment/Interventions: Self-care/ADL training;Therapeutic exercise;Balance training;Therapeutic activities;Energy conservation;DME and/or AE instruction;Patient/family education    OT Goals(Current goals can be found in the care plan section) Acute Rehab OT Goals Patient Stated Goal: to get stronger and go home. OT Goal Formulation: With patient Time For Goal Achievement: 11/09/20 Potential to Achieve Goals: Good ADL Goals Pt Will Perform Lower Body Dressing: with modified independence;sit to/from stand Pt Will Transfer to Toilet:  with modified independence;ambulating Pt Will Perform Toileting - Clothing Manipulation and hygiene: with modified independence;sit to/from stand  OT Frequency: Min 1X/week   Barriers to D/C:    none known at this time          AM-PAC OT "6 Clicks" Daily Activity     Outcome Measure Help from another person eating meals?: None Help from another person taking care of personal grooming?: A Little Help from another person toileting, which includes using toliet, bedpan, or urinal?: A Little Help from another person bathing (including washing, rinsing, drying)?: A Little Help from another person to put on and taking off regular upper body clothing?: None Help from another person to put on and taking off regular lower body clothing?: A Little 6 Click Score: 20   End of Session Equipment Utilized During Treatment: Oxygen (4L via [place]) Nurse Communication: Mobility status;Other (comment) (O2 weaned down to 4L during session)  Activity Tolerance: Patient tolerated treatment well Patient left: in bed;with call bell/phone within reach;with bed alarm set  OT Visit Diagnosis: Muscle weakness (generalized) (M62.81);Unsteadiness on feet (R26.81);History of falling (Z91.81)                Time: 8366-2947 OT Time Calculation (min): 29 min Charges:  OT General Charges $OT Visit: 1 Visit OT Evaluation $OT Eval Low Complexity: 1 Low OT Treatments $Self Care/Home Management : 8-22 mins  Darleen Crocker, MS, OTR/L , CBIS ascom [phone]  10/26/20, 2:26 PM

## 2020-10-26 NOTE — Care Management Important Message (Signed)
Important Message  Patient Details  Name: [name] MRN: [id number] Date of Birth: 09/18/1946   Medicare Important Message Given:  Yes     Anselm Pancoast, RN 10/26/2020, 11:50 AM

## 2020-10-27 DIAGNOSIS — I5043 Acute on chronic combined systolic (congestive) and diastolic (congestive) heart failure: Secondary | ICD-10-CM | POA: Diagnosis not present

## 2020-10-27 LAB — BASIC METABOLIC PANEL
Anion gap: 10 (ref 5–15)
BUN: 53 mg/dL — ABNORMAL HIGH (ref 8–23)
CO2: 33 mmol/L — ABNORMAL HIGH (ref 22–32)
Calcium: 8.8 mg/dL — ABNORMAL LOW (ref 8.9–10.3)
Chloride: 90 mmol/L — ABNORMAL LOW (ref 98–111)
Creatinine, Ser: 1.99 mg/dL — ABNORMAL HIGH (ref 0.61–1.24)
GFR, Estimated: 34 mL/min — ABNORMAL LOW (ref >60)
Glucose, Bld: 167 mg/dL — ABNORMAL HIGH (ref 70–99)
Potassium: 3.5 mmol/L (ref 3.5–5.1)
Sodium: 133 mmol/L — ABNORMAL LOW (ref 135–145)

## 2020-10-27 LAB — GLUCOSE, CAPILLARY
Glucose-Capillary: 166 mg/dL — ABNORMAL HIGH (ref 70–99)
Glucose-Capillary: 172 mg/dL — ABNORMAL HIGH (ref 70–99)
Glucose-Capillary: 233 mg/dL — ABNORMAL HIGH (ref 70–99)
Glucose-Capillary: 196 mg/dL — ABNORMAL HIGH (ref 70–99)
Glucose-Capillary: 281 mg/dL — ABNORMAL HIGH (ref 70–99)

## 2020-10-27 NOTE — Progress Notes (Signed)
PROGRESS NOTE    [name]  SKA:[id number] DOB: 03-26-1946 DOA: 10/23/2020 PCP: [name], MD   Brief Narrative:  74 year old male with known heart failure with reduced ejection fraction 35 to 40% and mild mitral insufficiency who presented to the emergency room with complaints of progressive shortness of breath.  Was found to be hypoxic on arrival.  Chest imaging significant for interstitial edema.  Patient required initiation of NIPPV therapy for symptoms and refractory fluid overload.  He seemed to respond to this therapy quite well.  He was awake and alert on my arrival.  He answers all questions appropriately with intact mentation.  12/23: Required BiPAP overnight again.  When weaned off easily remained stable and low to mid 90s however has persistent feelings of dyspnea.  Net -1 L since admission.  Diuresis limited by chronic kidney disease.  12/24: Respiratory status slowly improving.  Continues to require nocturnal BiPAP.  This morning he is on 8 L high flow nasal cannula.  Mentating clearly.  Frustrated by continued hospital stay.  12/25: Weaned to 4 L nasal cannula.  Mentating well.  Yesterday was first night patient was noncompliant on BiPAP   Assessment & Plan:   Principal Problem:   Acute on chronic combined systolic and diastolic CHF (congestive heart failure) (HCC) Active Problems:   Acute respiratory failure with hypoxia (HCC)   Essential hypertension   COPD with acute exacerbation (HCC)   Hyperglycemia due to type 2 diabetes mellitus ([place])   History of CVA (cerebrovascular accident)   Elevated troponin   CKD (chronic kidney disease), stage IV (HCC)  Acute on chronic combined systolic & diastolic CHF  Acute hypoxic respiratory failure  secondary to above - Presents with progressive SOB over 24 hours, was saturating 80% with EMS and had increased WOB, and found in ED to have interstitial edema on CXR   - EF was 35-40% with grade 1 diastolic dysfunction in  February 2021  -Treated with IV Lasix and BiPAP in ED  -Continues to require nocturnal BiPAP -Patient net - 4 L since admission Plan: Continue Lasix IV 40 mg every 12 hours Continue Zaroxolyn 2.5 mg daily Anticipate transition to p.o. Lasix within 24 hours Monitor renal function electrolytes carefully Daily weights Strict I's and O's Try to stay off BiPAP tonight  COPD  Does not appear acutely exacerbated.  Respiratory failure likely driven by congestive heart failure.  Continue Combivent per home regimen  Elevated troponin  - HS troponin elevated to 55 then 54 without chest pain  - Low suspicion for ACS, likely secondary to acute CHF  - Treat CHF as above, continue statin and nitrates   CKD IV  - SCr is 2.06 on admission, close to apparent baseline  Plan: Continue Lasix as above Metolazone as above Daily renal function   Hypertension  - BP at goal, continue Norvasc and hydralazine    History of CVA  - Continue statin and Eliquis    Atrial fibrillation  - CHADS-VASc at least 41 (age x2, CVA x2, DM, CHF, HTN) - Continue Eliquis   Insulin-dependent DM  - A1c was 7.6% in February 2021  - Continue CBGs and insulin     DVT prophylaxis: Eliquis Code Status: Full Family Communication: Wife Bethena Roys [phone] on 10/26/2020 Disposition Plan:Status is: Inpatient  Remains inpatient appropriate because:Inpatient level of care appropriate due to severity of illness   Dispo: The patient is from: Home              Anticipated  d/c is to: Home              Anticipated d/c date is: 1 day              Patient currently is not medically stable to d/c.Marland Kitchen  Anticipate medical readiness in 24 hours.  Will attempt to wean off nasal cannula entirely.  We will plan on ambulating patient prior to discharge tomorrow morning.            Consultants:   Cardiology-[facility]  Procedures:   None  Antimicrobials:   None   Subjective: Patient examined.   Respiratory status improving.  Was not on BiPAP last night.  No pain complaints.  On 4 L this morning.  Objective: Vitals:   10/26/20 1537 10/26/20 2030 10/27/20 0500 10/27/20 0718  BP: (!) 129/50 108/79 122/84 (!) 143/65  Pulse: (!) 50 68 (!) 58 (!) 47  Resp: 16 18 18 18   Temp: 98.7 F (37.1 C) 97.8 F (36.6 C) 98.1 F (36.7 C) 98.7 F (37.1 C)  TempSrc: Oral  Oral Oral  SpO2: 97% 95% 95% 97%  Weight:   74.5 kg   Height:        Intake/Output Summary (Last 24 hours) at 10/27/2020 1101 Last data filed at 10/27/2020 1015 Gross per 24 hour  Intake 480 ml  Output 1800 ml  Net -1320 ml   Filed Weights   10/23/20 2307 10/26/20 0409 10/27/20 0500  Weight: 77.1 kg 76.6 kg 74.5 kg    Examination:  General exam: Appears calm and comfortable  Respiratory system: Fine bibasilar crackles.  Normal work of breathing.  4 L Cardiovascular system: S1 & S2 heard, RRR. No JVD, murmurs, rubs, gallops or clicks. No pedal edema. Gastrointestinal system: Abdomen is nondistended, soft and nontender. No organomegaly or masses felt. Normal bowel sounds heard. Central nervous system: Alert and oriented. No focal neurological deficits. Extremities: Symmetric 5 x 5 power. Skin: No rashes, lesions or ulcers Psychiatry: Judgement and insight appear normal. Mood & affect appropriate.     Data Reviewed: I have personally reviewed following labs and imaging studies  CBC: Recent Labs  Lab 10/23/20 2315 10/24/20 0534 10/25/20 0411 10/26/20 0354  WBC 12.7* 9.3 13.9* 12.4*  NEUTROABS 10.4*  --   --   --   HGB 13.4 12.6* 11.8* 12.2*  HCT 43.3 39.6 38.1* 39.1  MCV 79.9* 79.0* 80.0 80.0  PLT 282 248 239 742   Basic Metabolic Panel: Recent Labs  Lab 10/23/20 2315 10/24/20 0534 10/25/20 0411 10/26/20 0354 10/27/20 0729  NA 137 135 137 137 133*  K 3.9 4.3 4.2 3.8 3.5  CL 94* 97* 98 95* 90*  CO2 30 28 31  33* 33*  GLUCOSE 209* 260* 143* 117* 167*  BUN 30* 34* 44* 49* 53*  CREATININE 2.06*  1.96* 2.05* 2.04* 1.99*  CALCIUM 9.1 8.9 8.7* 8.6* 8.8*  MG 2.4 2.7*  --   --   --    GFR: Estimated Creatinine Clearance: 28.9 mL/min (A) (by C-G formula based on SCr of 1.99 mg/dL (H)). Liver Function Tests: Recent Labs  Lab 10/23/20 2315  AST 17  ALT 15  ALKPHOS 118  BILITOT 0.8  PROT 7.7  ALBUMIN 3.9   No results for input(s): LIPASE, AMYLASE in the last 168 hours. No results for input(s): AMMONIA in the last 168 hours. Coagulation Profile: No results for input(s): INR, PROTIME in the last 168 hours. Cardiac Enzymes: No results for input(s): CKTOTAL, CKMB, CKMBINDEX, TROPONINI  in the last 168 hours. BNP (last 3 results) No results for input(s): PROBNP in the last 8760 hours. HbA1C: No results for input(s): HGBA1C in the last 72 hours. CBG: Recent Labs  Lab 10/26/20 1625 10/26/20 2032 10/26/20 2351 10/27/20 0443 10/27/20 0738  GLUCAP 175* 219* 163* 166* 172*   Lipid Profile: No results for input(s): CHOL, HDL, LDLCALC, TRIG, CHOLHDL, LDLDIRECT in the last 72 hours. Thyroid Function Tests: No results for input(s): TSH, T4TOTAL, FREET4, T3FREE, THYROIDAB in the last 72 hours. Anemia Panel: No results for input(s): VITAMINB12, FOLATE, FERRITIN, TIBC, IRON, RETICCTPCT in the last 72 hours. Sepsis Labs: Recent Labs  Lab 10/23/20 2315  PROCALCITON 0.17    Recent Results (from the past 240 hour(s))  Resp Panel by RT-PCR (Flu A&B, Covid) Nasopharyngeal Swab     Status: None   Collection Time: 10/23/20 11:33 PM   Specimen: Nasopharyngeal Swab; Nasopharyngeal(NP) swabs in vial transport medium  Result Value Ref Range Status   SARS Coronavirus 2 by RT PCR NEGATIVE NEGATIVE Final    Comment: (NOTE) SARS-CoV-2 target nucleic acids are NOT DETECTED.  The SARS-CoV-2 RNA is generally detectable in upper respiratory specimens during the acute phase of infection. The lowest concentration of SARS-CoV-2 viral copies this assay can detect is 138 copies/mL. A negative  result does not preclude SARS-Cov-2 infection and should not be used as the sole basis for treatment or other patient management decisions. A negative result may occur with  improper specimen collection/handling, submission of specimen other than nasopharyngeal swab, presence of viral mutation(s) within the areas targeted by this assay, and inadequate number of viral copies(<138 copies/mL). A negative result must be combined with clinical observations, patient history, and epidemiological information. The expected result is Negative.  Fact Sheet for Patients:  EntrepreneurPulse.com.au  Fact Sheet for Healthcare Providers:  IncredibleEmployment.be  This test is no t yet approved or cleared by the [place] FDA and  has been authorized for detection and/or diagnosis of SARS-CoV-2 by FDA under an Emergency Use Authorization (EUA). This EUA will remain  in effect (meaning this test can be used) for the duration of the COVID-19 declaration under Section 564(b)(1) of the Act, 21 U.S.C.section 360bbb-3(b)(1), unless the authorization is terminated  or revoked sooner.       Influenza A by PCR NEGATIVE NEGATIVE Final   Influenza B by PCR NEGATIVE NEGATIVE Final    Comment: (NOTE) The Xpert Xpress SARS-CoV-2/FLU/RSV plus assay is intended as an aid in the diagnosis of influenza from Nasopharyngeal swab specimens and should not be used as a sole basis for treatment. Nasal washings and aspirates are unacceptable for Xpert Xpress SARS-CoV-2/FLU/RSV testing.  Fact Sheet for Patients: EntrepreneurPulse.com.au  Fact Sheet for Healthcare Providers: IncredibleEmployment.be  This test is not yet approved or cleared by the [place] FDA and has been authorized for detection and/or diagnosis of SARS-CoV-2 by FDA under an Emergency Use Authorization (EUA). This EUA will remain in effect (meaning this test can be used)  for the duration of the COVID-19 declaration under Section 564(b)(1) of the Act, 21 U.S.C. section 360bbb-3(b)(1), unless the authorization is terminated or revoked.  Performed at [facility], [address], [place], [place] [address]          Radiology Studies: DG Chest [place] 1 View  Result Date: 10/25/2020 CLINICAL DATA:  Respiratory failure EXAM: PORTABLE CHEST 1 VIEW COMPARISON:  October 23, 2020 FINDINGS: There is diffuse interstitial thickening. No edema or consolidation. There is a small left  pleural effusion. Heart is borderline enlarged with pulmonary venous hypertension. Status post coronary artery bypass grafting. There is aortic atherosclerosis. No adenopathy. No bone lesions. IMPRESSION: Borderline cardiac enlargement with a degree of pulmonary vascular congestion. Status post coronary artery bypass grafting. Small left pleural effusion. Interstitium is thickened, likely due to chronic bronchitis with questionable mild chronic interstitial edema. No consolidation. There may be a degree of chronic congestive heart failure. Aortic Atherosclerosis (ICD10-I70.0). Electronically Signed   By: Lowella Grip III M.D.   On: 10/25/2020 11:54        Scheduled Meds: . amLODipine  5 mg Oral Daily  . apixaban  5 mg Oral BID  . furosemide  40 mg Intravenous BID  . gabapentin  100 mg Oral BID  . hydrALAZINE  50 mg Oral Q8H  . insulin aspart  0-9 Units Subcutaneous Q4H  . insulin glargine  20 Units Subcutaneous Daily  . isosorbide mononitrate  30 mg Oral Daily  . metolazone  2.5 mg Oral Daily  . pantoprazole  40 mg Oral Daily  . simvastatin  20 mg Oral q1800  . traZODone  50 mg Oral QHS   Continuous Infusions:   LOS: 3 days    Time spent: 15 minutes    Sidney Ace, MD [facility] Pager [phone]  If 7PM-7AM, please contact night-coverage 10/27/2020, 11:01 AM

## 2020-10-27 NOTE — Progress Notes (Signed)
[facility] Encounter Note  Patient: [name] / Admit Date: 10/23/2020 / Date of Encounter: 10/27/2020, 6:54 AM   Subjective: 12/24.  Patient claims that he is significantly improved from the last few days with significantly less lower extremity edema and shortness of breath.  His oxygenation has improved.  The patient overall has had telemetry showing normal sinus rhythm with bundle branch block with preventricular contractions.  The patient has had continued bibasilar Rales consistent with continued need for heart failure treatment.  Previous echocardiogram has shown ejection fraction of 35%.  There is no current evidence of acute coronary syndrome anginal symptoms at this time  12/25.  Patient feels much better since admission.  No significant changes overnight.  Patient continues to have irregular heartbeat mainly normal sinus rhythm with frequent preventricular contractions by telemetry.  Patient's glomerular filtration rate continues to be stable despite intravenous Lasix use.  Patient does have 4 L of output but still has bibasilar Rales and significant congestive heart failure type symptoms.  No evidence of chest discomfort or other cardiovascular symptoms  Review of Systems: Positive for: Shortness of breath Negative for: Vision change, hearing change, syncope, dizziness, nausea, vomiting,diarrhea, bloody stool, stomach pain, cough, congestion, diaphoresis, urinary frequency, urinary pain,skin lesions, skin rashes Others previously listed  Objective: Telemetry: Normal sinus rhythm with bundle branch block and PVCs Physical Exam: Blood pressure 122/84, pulse (!) 58, temperature 98.1 F (36.7 C), temperature source Oral, resp. rate 18, height 5\' 6"  (1.676 m), weight 74.5 kg, SpO2 95 %. Body mass index is 26.5 kg/m. General: Well developed, well nourished, in no acute distress. Head: Normocephalic, atraumatic, sclera non-icteric, no xanthomas, nares are without  discharge. Neck: No apparent masses Lungs: Normal respirations with no wheezes, no rhonchi, basilar rales , basilar crackles   Heart: Irregular rate and rhythm, normal S1 S2, no murmur, no rub, no gallop, PMI is normal size and placement, carotid upstroke normal without bruit, jugular venous pressure normal Abdomen: Soft, non-tender, non-distended with normoactive bowel sounds. No hepatosplenomegaly. Abdominal aorta is normal size without bruit Extremities: Trace edema, no clubbing, no cyanosis, no ulcers,  Peripheral: 2+ radial, 2+ femoral, 2+ dorsal pedal pulses Neuro: Alert and oriented. Moves all extremities spontaneously. Psych:  Responds to questions appropriately with a normal affect.   Intake/Output Summary (Last 24 hours) at 10/27/2020 0654 Last data filed at 10/27/2020 0500 Gross per 24 hour  Intake 240 ml  Output 1950 ml  Net -1710 ml    Inpatient Medications:  . amLODipine  5 mg Oral Daily  . apixaban  5 mg Oral BID  . furosemide  40 mg Intravenous BID  . gabapentin  100 mg Oral BID  . hydrALAZINE  50 mg Oral Q8H  . insulin aspart  0-9 Units Subcutaneous Q4H  . insulin glargine  20 Units Subcutaneous Daily  . isosorbide mononitrate  30 mg Oral Daily  . metolazone  2.5 mg Oral Daily  . pantoprazole  40 mg Oral Daily  . simvastatin  20 mg Oral q1800  . traZODone  50 mg Oral QHS   Infusions:   Labs: Recent Labs    10/25/20 0411 10/26/20 0354  NA 137 137  K 4.2 3.8  CL 98 95*  CO2 31 33*  GLUCOSE 143* 117*  BUN 44* 49*  CREATININE 2.05* 2.04*  CALCIUM 8.7* 8.6*   No results for input(s): AST, ALT, ALKPHOS, BILITOT, PROT, ALBUMIN in the last 72 hours. Recent Labs    10/25/20 0411 10/26/20  0354  WBC 13.9* 12.4*  HGB 11.8* 12.2*  HCT 38.1* 39.1  MCV 80.0 80.0  PLT 239 253   No results for input(s): CKTOTAL, CKMB, TROPONINI in the last 72 hours. Invalid input(s): POCBNP No results for input(s): HGBA1C in the last 72 hours.   Weights: Filed Weights    10/23/20 2307 10/26/20 0409 10/27/20 0500  Weight: 77.1 kg 76.6 kg 74.5 kg     Radiology/Studies:  DG Chest 1 View  Result Date: 10/23/2020 CLINICAL DATA:  Shortness of breath. EXAM: CHEST  1 VIEW COMPARISON:  June 02, 2020 FINDINGS: Multiple sternal wires and vascular clips are seen. Mild to moderate severity increased interstitial lung markings are seen. There is a small left pleural effusion. No pneumothorax is identified. The heart size and mediastinal contours are within normal limits. Degenerative changes seen throughout the thoracic spine. IMPRESSION: 1. Evidence of prior median sternotomy/CABG. 2. Mild to moderate severity interstitial edema. Electronically Signed   By: Virgina Norfolk M.D.   On: 10/23/2020 23:19   DG Chest [place] 1 View  Result Date: 10/25/2020 CLINICAL DATA:  Respiratory failure EXAM: PORTABLE CHEST 1 VIEW COMPARISON:  October 23, 2020 FINDINGS: There is diffuse interstitial thickening. No edema or consolidation. There is a small left pleural effusion. Heart is borderline enlarged with pulmonary venous hypertension. Status post coronary artery bypass grafting. There is aortic atherosclerosis. No adenopathy. No bone lesions. IMPRESSION: Borderline cardiac enlargement with a degree of pulmonary vascular congestion. Status post coronary artery bypass grafting. Small left pleural effusion. Interstitium is thickened, likely due to chronic bronchitis with questionable mild chronic interstitial edema. No consolidation. There may be a degree of chronic congestive heart failure. Aortic Atherosclerosis (ICD10-I70.0). Electronically Signed   By: Lowella Grip III M.D.   On: 10/25/2020 11:54     Assessment and Recommendation  74 y.o. male with known coronary artery disease status post coronary bypass graft diabetes hypertension hyperlipidemia chronic kidney disease stage III with acute systolic dysfunction congestive heart failure and no current evidence of myocardial  infarction and/or acute coronary syndrome 1.  Continuation of intravenous Lasix for acute systolic dysfunction congestive heart failure which appears to be improving at this time with no worsening chronic kidney disease.  Due to his continued bibasilar Rales.  Would watch closely for chronic kidney disease worsening 2.  No additional medication management for heart rate control and maintenance of normal sinus rhythm with concerns of significant bradycardia 3.  Eliquis 5 mg twice per day for further risk reduction in stroke with atrial fibrillation 4.  Hydralazine isosorbide for congestive heart failure due to concerns of side effects of ACE inhibitor angiotensin receptor blocker for which patient cannot tolerate 5.  High intensity cholesterol therapy for coronary artery disease with simvastatin without change 6.  Begin ambulation and follow-up for improvements of symptoms and further adjustments of medication management.  Signed, Serafina Royals M.D. FACC

## 2020-10-28 DIAGNOSIS — I5043 Acute on chronic combined systolic (congestive) and diastolic (congestive) heart failure: Secondary | ICD-10-CM | POA: Diagnosis not present

## 2020-10-28 LAB — GLUCOSE, CAPILLARY
Glucose-Capillary: 153 mg/dL — ABNORMAL HIGH (ref 70–99)
Glucose-Capillary: 234 mg/dL — ABNORMAL HIGH (ref 70–99)
Glucose-Capillary: 291 mg/dL — ABNORMAL HIGH (ref 70–99)

## 2020-10-28 MED ORDER — TORSEMIDE 20 MG PO TABS: 40.0000 mg | ORAL_TABLET | Freq: Every day | ORAL | 0 refills | Status: DC

## 2020-10-28 NOTE — Discharge Instructions (Signed)
Heart Failure Exacerbation  Heart failure is a condition in which the heart does not fill up with enough blood, and therefore does not pump enough blood and oxygen to the body. When this happens, parts of the body do not get the blood and oxygen they need to function properly. This can cause symptoms such as breathing problems, fatigue, swelling, and confusion. Heart failure exacerbation refers to heart failure symptoms that get worse. The symptoms may get worse suddenly or develop slowly over time. Heart failure exacerbation is a serious medical problem that should be treated right away. What are the causes? A heart failure exacerbation can be triggered by:  Not taking your heart failure medicines correctly.  Infections.  Eating an unhealthy diet or a diet that is high in salt (sodium).  Drinking too much fluid.  Drinking alcohol.  Taking illegal drugs, such as cocaine or methamphetamine.  Not exercising. Other causes include:  Other heart conditions such as an irregular heartbeat (arrhythmia).  Anemia.  Other medical problems, such as kidney failure. Sometimes the cause of the exacerbation is not known. What are the signs or symptoms? When heart failure symptoms suddenly or slowly get worse, this may be a sign of heart failure exacerbation. Symptoms of heart failure include:  Breathing problems or shortness of breath.  Chronic coughing or wheezing.  Fatigue.  Nausea or lack of appetite.  Feeling light-headed.  Confusion or memory loss.  Increased heart rate or irregular heartbeat.  Buildup of fluid in the legs, ankles, feet, or abdomen.  Difficulty breathing when lying down. How is this diagnosed? This condition is diagnosed based on:  Your symptoms and medical history.  A physical exam. You may also have tests, including:  Electrocardiogram (ECG). This test measures the electrical activity of your heart.  Echocardiogram. This test uses sound waves to take a  picture of your heart to see how well it works.  Blood tests.  Imaging tests, such as: ? Chest X-ray. ? MRI. ? Ultrasound.  Stress test. This test examines how well your heart functions when you exercise. Your heart is monitored while you exercise on a treadmill or exercise bike. If you cannot exercise, medicines may be used to increase your heartbeat in place of exercise.  Cardiac catheterization. During this test, a thin, flexible tube (catheter) is inserted into a blood vessel and threaded up to your heart. This test allows your health care provider to check the arteries that lead to your heart (coronary arteries).  Right heart catheterization. During this test, the pressure in your heart is measured. How is this treated? This condition may be treated by:  Adjusting your heart medicines.  Maintaining a healthy lifestyle. This includes: ? Eating a heart-healthy diet that is low in sodium. ? Not using any products that contain nicotine or tobacco, such as cigarettes and e-cigarettes. ? Regular exercise. ? Monitoring your fluid intake. ? Monitoring your weight and reporting changes to your health care provider.  Treating sleep apnea, if you have this condition.  Surgery. This may include: ? Implanting a device that helps both sides of your heart contract at the same time (cardiac resynchronization therapy device). This can help with heart function and relieve heart failure symptoms. ? Implanting a device that can correct heart rhythm problems (implantable cardioverter defibrillator). ? Connecting a device to your heart to help it pump blood (ventricular assist device). ? Heart transplant. Follow these instructions at home: Medicines  Take over-the-counter and prescription medicines only as told by your   health care provider.  Do not stop taking your medicines or change the amount you take. If you are having problems or side effects from your medicines, talk to your health care  provider.  If you are having difficulty paying for your medicines, contact a social worker or your clinic. There are many programs to assist with medicine costs.  Talk to your health care provider before starting any new medicines or supplements.  Make sure your health care provider and pharmacist have a list of all the medicines you are taking. Eating and drinking   Avoid drinking alcohol.  Eat a heart-healthy diet as told by your health care provider. This includes: ? Plenty of fruits and vegetables. ? Lean proteins. ? Low-fat dairy. ? Whole grains. ? Foods that are low in sodium. Activity   Exercise regularly as told by your health care provider. Balance exercise with rest.  Ask your health care provider what activities are safe for you. This includes sexual activity, exercise, and daily tasks at home or work. Lifestyle  Do not use any products that contain nicotine or tobacco, such as cigarettes and e-cigarettes. If you need help quitting, ask your health care provider.  Maintain a healthy weight. Ask your health care provider what weight is healthy for you.  Consider joining a patient support group. This can help with emotional problems you may have, such as stress and anxiety. General instructions  Talk to your health care provider about flu and pneumonia vaccines.  Keep a list of medicines that you are taking. This may help in emergency situations.  Keep all follow-up visits as told by your health care provider. This is important. Contact a health care provider if:  You have questions about your medicines or you miss a dose.  You feel anxious, depressed, or stressed.  You have swelling in your feet, ankles, legs, or abdomen.  You have shortness of breath during activity or exercise.  You have a cough.  You have a fever.  You have trouble sleeping.  You gain 2-3 lb (1-1.4 kg) in 24 hours or 5 lb (2.3 kg) in a week. Get help right away if:  You have chest  pain.  You have shortness of breath while resting.  You have severe fatigue.  You are confused.  You have severe dizziness.  You have a rapid or irregular heartbeat.  You have nausea or you vomit.  You have a cough that is worse at night or you cannot lie flat.  You have a cough that will not go away.  You have severe depression or sadness. Summary  When heart failure symptoms get worse, it is called heart failure exacerbation.  Common causes of this condition include taking medicines incorrectly, infections, and drinking alcohol.  This condition may be treated by adjusting medicines, maintaining a healthy lifestyle, or surgery.  Do not stop taking your medicines or change the amount you take. If you are having problems or side effects from your medicines, talk to your health care provider. This information is not intended to replace advice given to you by your health care provider. Make sure you discuss any questions you have with your health care provider. Document Revised: 10/02/2017 Document Reviewed: 03/03/2017 Elsevier Patient Education  2020 [employer]  

## 2020-10-28 NOTE — Progress Notes (Signed)
[facility] Encounter Note  Patient: [name] / Admit Date: 10/23/2020 / Date of Encounter: 10/28/2020, 7:36 AM   Subjective: 12/24.  Patient claims that he is significantly improved from the last few days with significantly less lower extremity edema and shortness of breath.  His oxygenation has improved.  The patient overall has had telemetry showing normal sinus rhythm with bundle branch block with preventricular contractions.  The patient has had continued bibasilar Rales consistent with continued need for heart failure treatment.  Previous echocardiogram has shown ejection fraction of 35%.  There is no current evidence of acute coronary syndrome anginal symptoms at this time  12/25.  Patient feels much better since admission.  No significant changes overnight.  Patient continues to have irregular heartbeat mainly normal sinus rhythm with frequent preventricular contractions by telemetry.  Patient's glomerular filtration rate continues to be stable despite intravenous Lasix use.  Patient does have 4 L of output but still has bibasilar Rales and significant congestive heart failure type symptoms.  No evidence of chest discomfort or other cardiovascular symptoms  12/26.  Patient continues to have significant amount of urine output with another 2 L of output with current diuretics.  Glomerular filtration rate and kidney dysfunction appears not to have changed to a dramatic amount.  Blood pressure is improving with appropriate continued medication management from outpatient.  The patient does continue to have normal sinus rhythm with frequent preventricular contractions by telemetry.  On exam the patient does continue to have some bibasilar crackles and decreased breath sounds in the right base which are improved but still not quite resolved.  Review of Systems: Positive for: Shortness of breath Negative for: Vision change, hearing change, syncope, dizziness, nausea,  vomiting,diarrhea, bloody stool, stomach pain, cough, congestion, diaphoresis, urinary frequency, urinary pain,skin lesions, skin rashes Others previously listed  Objective: Telemetry: Normal sinus rhythm with bundle branch block and PVCs Physical Exam: Blood pressure (!) 143/56, pulse (!) 52, temperature 98.1 F (36.7 C), temperature source Oral, resp. rate 17, height 5\' 6"  (1.676 m), weight 74.3 kg, SpO2 95 %. Body mass index is 26.44 kg/m. General: Well developed, well nourished, in no acute distress. Head: Normocephalic, atraumatic, sclera non-icteric, no xanthomas, nares are without discharge. Neck: No apparent masses Lungs: Normal respirations with no wheezes, no rhonchi, basilar rales , basilar crackles   Heart: Irregular rate and rhythm, normal S1 S2, no murmur, no rub, no gallop, PMI is normal size and placement, carotid upstroke normal without bruit, jugular venous pressure normal Abdomen: Soft, non-tender, non-distended with normoactive bowel sounds. No hepatosplenomegaly. Abdominal aorta is normal size without bruit Extremities: Trace edema, no clubbing, no cyanosis, no ulcers,  Peripheral: 2+ radial, 2+ femoral, 2+ dorsal pedal pulses Neuro: Alert and oriented. Moves all extremities spontaneously. Psych:  Responds to questions appropriately with a normal affect.   Intake/Output Summary (Last 24 hours) at 10/28/2020 0736 Last data filed at 10/28/2020 1696 Gross per 24 hour  Intake 240 ml  Output 2100 ml  Net -1860 ml    Inpatient Medications:  . amLODipine  5 mg Oral Daily  . apixaban  5 mg Oral BID  . furosemide  40 mg Intravenous BID  . gabapentin  100 mg Oral BID  . hydrALAZINE  50 mg Oral Q8H  . insulin aspart  0-9 Units Subcutaneous Q4H  . insulin glargine  20 Units Subcutaneous Daily  . isosorbide mononitrate  30 mg Oral Daily  . metolazone  2.5 mg Oral Daily  .  pantoprazole  40 mg Oral Daily  . simvastatin  20 mg Oral q1800  . traZODone  50 mg Oral QHS    Infusions:   Labs: Recent Labs    10/26/20 0354 10/27/20 0729  NA 137 133*  K 3.8 3.5  CL 95* 90*  CO2 33* 33*  GLUCOSE 117* 167*  BUN 49* 53*  CREATININE 2.04* 1.99*  CALCIUM 8.6* 8.8*   No results for input(s): AST, ALT, ALKPHOS, BILITOT, PROT, ALBUMIN in the last 72 hours. Recent Labs    10/26/20 0354  WBC 12.4*  HGB 12.2*  HCT 39.1  MCV 80.0  PLT 253   No results for input(s): CKTOTAL, CKMB, TROPONINI in the last 72 hours. Invalid input(s): POCBNP No results for input(s): HGBA1C in the last 72 hours.   Weights: Filed Weights   10/26/20 0409 10/27/20 0500 10/28/20 0705  Weight: 76.6 kg 74.5 kg 74.3 kg     Radiology/Studies:  DG Chest 1 View  Result Date: 10/23/2020 CLINICAL DATA:  Shortness of breath. EXAM: CHEST  1 VIEW COMPARISON:  June 02, 2020 FINDINGS: Multiple sternal wires and vascular clips are seen. Mild to moderate severity increased interstitial lung markings are seen. There is a small left pleural effusion. No pneumothorax is identified. The heart size and mediastinal contours are within normal limits. Degenerative changes seen throughout the thoracic spine. IMPRESSION: 1. Evidence of prior median sternotomy/CABG. 2. Mild to moderate severity interstitial edema. Electronically Signed   By: Virgina Norfolk M.D.   On: 10/23/2020 23:19   DG Chest Port 1 View  Result Date: 10/25/2020 CLINICAL DATA:  Respiratory failure EXAM: PORTABLE CHEST 1 VIEW COMPARISON:  October 23, 2020 FINDINGS: There is diffuse interstitial thickening. No edema or consolidation. There is a small left pleural effusion. Heart is borderline enlarged with pulmonary venous hypertension. Status post coronary artery bypass grafting. There is aortic atherosclerosis. No adenopathy. No bone lesions. IMPRESSION: Borderline cardiac enlargement with a degree of pulmonary vascular congestion. Status post coronary artery bypass grafting. Small left pleural effusion. Interstitium is thickened,  likely due to chronic bronchitis with questionable mild chronic interstitial edema. No consolidation. There may be a degree of chronic congestive heart failure. Aortic Atherosclerosis (ICD10-I70.0). Electronically Signed   By: Lowella Grip III M.D.   On: 10/25/2020 11:54     Assessment and Recommendation  74 y.o. male with known coronary artery disease status post coronary bypass graft diabetes hypertension hyperlipidemia chronic kidney disease stage III with acute systolic dysfunction congestive heart failure and no current evidence of myocardial infarction and/or acute coronary syndrome and slowly improving with continued aggressive diuresis 1.  Continuation of intravenous Lasix for acute systolic dysfunction congestive heart failure which appears to be improving at this time with no worsening chronic kidney disease.  Due to his continued bibasilar Rales.  Would watch closely for chronic kidney disease worsening with possibility of changing to oral torsemide at 40 mg for discharge 2.  No additional medication management for heart rate control and maintenance of normal sinus rhythm with concerns of significant bradycardia 3.  Eliquis 5 mg twice per day for further risk reduction in stroke with atrial fibrillation 4.  Hydralazine isosorbide for congestive heart failure due to concerns of side effects of ACE inhibitor angiotensin receptor blocker for which patient cannot tolerate 5.  High intensity cholesterol therapy for coronary artery disease with simvastatin without change 6.  Begin ambulation and follow-up for improvements of symptoms and further adjustments of medication management.  Will consider the  possibility of discharge to home when oxygen requirement significantly improved and less of basilar rales  Signed, Serafina Royals M.D. FACC

## 2020-10-28 NOTE — TOC Initial Note (Signed)
Transition of Care [facility]) - Initial/Assessment Note    Patient Details  Name: [name] MRN: [id number] Date of Birth: February 25, 1946  Transition of Care [facility]) CM/SW Contact:    Barry Masson, RN Phone Number:[phone] 10/28/2020, 1:15 PM  Clinical Narrative:                 RN spoke with pt concerning the recommended services for his discharge today home. Pt receptive the [facility] PT[facility]. Agreed arrangements with [facility]) however delayed with the start of services 1/12 weeks prior to the initial visit (pt aware). Pt has declined the recommended 3-1 commode indicating his bathroom was close to his bed to ambulate. Pt lives in a single level home with his spouse [name]) who transports pt to all his medical appointments. Pt able to afford all his medications with no delays. No other needs requested at this time.  TOC team will remain available for any ongoing discharge needs.  Expected Discharge Plan: [place] Barriers to Discharge: Family Issues,ED Claims of Negligence on the Part of Facility/Family   Patient Goals and CMS Choice   CMS Medicare.gov Compare Post Acute Care list provided to:: Patient Choice offered to / list presented to : Patient  Expected Discharge Plan and Services Expected Discharge Plan: [place] In-house Referral: Clinical Social Work   Post Acute Care Choice: [place] arrangements for the past 2 months: [place] Expected Discharge Date: 10/28/20                         HH Arranged: PT,OT [place] Date [place]: 10/28/20 Time HH Agency Contacted: [id number]    Prior Living Arrangements/Services Living arrangements for the past 2 months: [place] with:: Spouse Patient language and need for interpreter reviewed:: Yes Do you feel safe going back to the place where you live?: Yes      Need for Family Participation in Patient Care: Yes  (Comment) Care giver support system in place?: Yes (comment)   Criminal Activity/Legal Involvement Pertinent to Current Situation/Hospitalization: No - Comment as needed  Activities of Daily Living      Permission Sought/Granted   Permission granted to share information with : Yes, Verbal Permission Granted              Emotional Assessment Appearance:: Appears stated age Attitude/Demeanor/Rapport: Engaged Affect (typically observed): Accepting Orientation: : Oriented to Self,Oriented to Place,Oriented to  Time,Oriented to Situation   Psych Involvement: No (comment)  Admission diagnosis:  Anticoagulated [Z79.01] Acute on chronic systolic congestive heart failure (HCC) [I50.23] Elevated brain natriuretic peptide (BNP) level [R79.89] Troponin I above reference range [R77.8] Acute on chronic combined systolic and diastolic CHF (congestive heart failure) (HCC) [I50.43] Acute hypoxemic respiratory failure (HCC) [J96.01] Chronic kidney disease, unspecified CKD stage [N18.9] Patient Active Problem List   Diagnosis Date Noted  . Acute on chronic combined systolic and diastolic CHF (congestive heart failure) ([place]) 10/24/2020  . Elevated troponin 10/24/2020  . CKD (chronic kidney disease), stage IV ([place]) 10/24/2020  . Malnutrition of moderate degree 12/27/2019  . Hypertensive emergency 12/26/2019  . Hyperglycemia due to type 2 diabetes mellitus ([place]) 12/26/2019  . Chronic kidney disease (CKD) stage G3b/A3, moderately decreased glomerular filtration rate (GFR) between 30-44 mL/min/1.73 square meter and albuminuria creatinine ratio greater than 300 mg/g (HCC) 12/26/2019  . History of COVID-19 12/26/2019  . History of CVA (cerebrovascular accident) 12/26/2019  .  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 History of second degree heart block 12/26/2019  . Elevated LFTs 12/26/2019  . Acute systolic CHF (congestive heart failure) ([place]) 10/11/2019  . Malignant neoplasm of prostate ([place]) 11/24/2016  .  Bright red rectal bleeding 10/02/2016  . COPD with acute exacerbation ([place]) 01/17/2016  . Chronic systolic heart failure ([place]) 12/12/2015  . Essential hypertension 12/12/2015  . COPD (chronic obstructive pulmonary disease) with chronic bronchitis ([place]) 12/12/2015  . Diabetes ([place]) 12/12/2015  . Tobacco abuse 12/12/2015  . Acute respiratory failure with hypoxia ([place]) 12/03/2015   PCP:  [name], MD Pharmacy:   [facility] [address] [place] [address] Phone: [phone] Fax: [phone]     Social Determinants of Health (SDOH) Interventions    Readmission Risk Interventions Readmission Risk Prevention Plan 10/28/2020 12/27/2019  Transportation Screening Complete Complete  PCP or Specialist Appt within 3-5 Days - Complete  HRI or [place] - Complete  Medication Review ([place]) Complete Complete  PCP or Specialist appointment within 3-5 days of discharge Complete -  [place] or Home Care Consult Complete -  Palliative Care Screening Not Applicable -  [place] Not Applicable -  Some recent data might be hidden

## 2020-10-28 NOTE — Discharge Summary (Signed)
Physician Discharge Summary  [name] RSW:[id number] DOB: 02/02/1946 DOA: 10/23/2020  PCP: [name], MD  Admit date: 10/23/2020 Discharge date: 10/28/2020  Admitted From: Home  Disposition:  Home with home health  Recommendations for Outpatient Follow-up:  1. Follow up with PCP in 1-2 weeks 2. Follow up with cardiology (Dr. Ubaldo Glassing) early next week 3. Follow up in heart failure clinic as directed  Home Health:Yes Equipment/Devices:No Discharge Condition:Stable CODE STATUS:Full Diet recommendation: Heart Healthy Brief/Interim Summary: 74 year old male with known heart failure with reduced ejection fraction 35 to 40% and mild mitral insufficiency who presented to the emergency room with complaints of progressive shortness of breath. Was found to be hypoxic on arrival. Chest imaging significant for interstitial edema. Patient required initiation of NIPPV therapy for symptoms and refractory fluid overload. He seemed to respond to this therapy quite well. He was awake and alert on my arrival. He answers all questions appropriately with intact mentation.  12/23: Required BiPAP overnight again.  When weaned off easily remained stable and low to mid 90s however has persistent feelings of dyspnea.  Net -1 L since admission.  Diuresis limited by chronic kidney disease.  12/24: Respiratory status slowly improving.  Continues to require nocturnal BiPAP.  This morning he is on 8 L high flow nasal cannula.  Mentating clearly.  Frustrated by continued hospital stay.  12/25: Weaned to 4 L nasal cannula.  Mentating well.  Yesterday was first night patient was not on BiPAP  12/26: Patient seen and examined on the day of discharge.  Weaned off submental oxygen.  Ambulated around the hall without distress.  Did briefly desaturate however recovered spontaneously.  Will not need home oxygen.  Discussed with cardiology.  Patient will likely need continued diuresis but okay to do at home.  They  will follow up in clinic early next week.  Notified case Freight forwarder, RN, patient's wife of patient's discharge    Discharge Diagnoses:  Principal Problem:   Acute on chronic combined systolic and diastolic CHF (congestive heart failure) ([place]) Active Problems:   Acute respiratory failure with hypoxia ([place])   Essential hypertension   COPD with acute exacerbation ([place])   Hyperglycemia due to type 2 diabetes mellitus ([place])   History of CVA (cerebrovascular accident)   Elevated troponin   CKD (chronic kidney disease), stage IV ([facility])  Acute on chronic combined systolic & diastolic CHF  Acute hypoxic respiratory failure secondary to above -Presents with progressive SOB over 24 hours, was saturating 80% with EMS and had increased WOB, and found in ED to have interstitial edema on CXR  - EF was 35-40% with grade 1 diastolic dysfunction in February 2021 -Treated with IV Lasix and BiPAP in ED -Continues to require nocturnal BiPAP -Patient net - 6.5 L since admission Transition to oral torsemide 40 mg daily Okay for discharge Follow-up in cardiology clinic early next week  COPD Does not appear acutely exacerbated.  Respiratory failure likely driven by congestive heart failure.    Can resume home regimen on discharge  Elevated troponin -HS troponin elevated to 55 then 54 without chest pain -Low suspicion for ACS, likely secondary to acute CHF  - Treat CHF as above, continue statin and nitrates  CKD IV -SCr is 2.06 on admission, close to apparent baseline Discharge on torsemide 40 mg daily Follow-up with PCP and cardiology with outpatient monitoring of kidney function   Hypertension -BP at goal, continue Norvasc and hydralazine  History of CVA -Continue statin and Eliquis   Atrial fibrillation  -  CHADS-VASc at least 41 (age x2, CVA x2, DM, CHF, HTN) - Continue Eliquis  -Previous rate control agent was discontinued  Insulin-dependent DM  - A1c was 7.6%  in February 2021  -Can resume home regimen on discharge  Discharge Instructions  Discharge Instructions    Diet - low sodium heart healthy   Complete by: As directed    Increase activity slowly   Complete by: As directed      Allergies as of 10/28/2020      Reactions   Lactose Intolerance (gi)       Medication List    STOP taking these medications   aspirin EC 81 MG tablet   ketoconazole 2 % cream Commonly known as: NIZORAL   metoprolol tartrate 25 MG tablet Commonly known as: LOPRESSOR     TAKE these medications   acetaminophen 325 MG tablet Commonly known as: TYLENOL Take 2 tablets (650 mg total) by mouth every 6 (six) hours as needed for mild pain (or Fever >/= 101).   albuterol 108 (90 Base) MCG/ACT inhaler Commonly known as: VENTOLIN HFA Inhale 2 puffs into the lungs every 6 (six) hours as needed for wheezing or shortness of breath.   amLODipine 5 MG tablet Commonly known as: NORVASC Take 1 tablet (5 mg total) by mouth daily.   apixaban 5 MG Tabs tablet Commonly known as: ELIQUIS Take 1 tablet (5 mg total) by mouth 2 (two) times daily.   bifidobacterium infantis capsule Take 1 capsule by mouth every morning.   cholecalciferol 1000 units tablet Commonly known as: VITAMIN D Take 1,000 Units by mouth daily.   Cinnamon 500 MG capsule Take 1,000 mg by mouth 2 (two) times daily.   Combivent Respimat 20-100 MCG/ACT Aers respimat Generic drug: Ipratropium-Albuterol Inhale 2 puffs into the lungs 4 (four) times daily as needed.   CRANBERRY EXTRACT PO Take 500 mg by mouth every morning.   Ensure Max Protein Liqd Take 330 mLs (11 oz total) by mouth 2 (two) times daily between meals.   gabapentin 100 MG capsule Commonly known as: NEURONTIN Take 100 mg by mouth 2 (two) times daily.   glucose 4 GM chewable tablet Chew 1 tablet by mouth as needed for low blood sugar.   hydrALAZINE 50 MG tablet Commonly known as: APRESOLINE Take 1 tablet (50 mg total) by  mouth every 8 (eight) hours.   insulin aspart 100 UNIT/ML injection Commonly known as: novoLOG 0-9 Units, Subcutaneous, 3 times daily with meals CBG < 70: Implement Hypoglycemia protocol/measures CBG 70 - 120: 0 units CBG 121 - 150: 1 unit CBG 151 - 200: 2 units CBG 201 - 250: 3 units CBG 251 - 300: 5 units CBG 301 - 350: 7 units CBG 351 - 400: 9 units CBG > 400: call MD What changed:   how much to take  how to take this  when to take this  additional instructions   isosorbide mononitrate 30 MG 24 hr tablet Commonly known as: IMDUR Take 1 tablet (30 mg total) by mouth daily.   lactase 3000 units tablet Commonly known as: LACTAID Take 3,000 Units by mouth 3 (three) times daily between meals as needed.   Lantus SoloStar 100 UNIT/ML Solostar Pen Generic drug: insulin glargine Inject 45 Units into the skin daily at 10 pm.   Magnesium 250 MG Tabs Take 250 mg by mouth daily.   multivitamin with minerals Tabs tablet Take 1 tablet by mouth daily.   NON FORMULARY Take 1 Dose by mouth  3 (three) times daily. Relief Factor OTC   omeprazole 20 MG capsule Commonly known as: PRILOSEC Take 20 mg by mouth daily.   potassium chloride 10 MEQ tablet Commonly known as: KLOR-CON Take 10 mEq by mouth daily.   PREVAGEN PO Take by mouth.   simvastatin 20 MG tablet Commonly known as: ZOCOR Take 20 mg by mouth at bedtime.   SUPER B COMPLEX/C PO Take 1 tablet by mouth every morning.   torsemide 20 MG tablet Commonly known as: DEMADEX Take 2 tablets (40 mg total) by mouth daily. What changed: when to take this   TOTAL MEMORY & FOCUS FORMULA PO Take 1 capsule by mouth See admin instructions. Take 2 capsule at 9:00 AM, 1 capsule at 5:00 PM, and 1 capsule at bedtime.   traZODone 50 MG tablet Commonly known as: DESYREL Take 50 mg by mouth at bedtime.   vitamin C 500 MG tablet Commonly known as: ASCORBIC ACID Take 500 mg by mouth 2 (two) times daily.       Follow-up  Information    [facility] Follow up on 11/07/2020.   Specialty: Cardiology Why: at 9:30am. Enter through the [place] entrance Contact information: [place] [phone]       Teodoro Spray, MD Follow up in 1 week(s).   Specialty: Cardiology Contact information: [place] [address] [phone]        [name], MD. Schedule an appointment as soon as possible for a visit in 1 week(s).   Specialty: Family Medicine Contact information: [address] [facility] [place] [address] [phone]              Allergies  Allergen Reactions  . Lactose Intolerance (Gi)     Consultations:  Cardiology-[facility]   Procedures/Studies: DG Chest 1 View  Result Date: 10/23/2020 CLINICAL DATA:  Shortness of breath. EXAM: CHEST  1 VIEW COMPARISON:  June 02, 2020 FINDINGS: Multiple sternal wires and vascular clips are seen. Mild to moderate severity increased interstitial lung markings are seen. There is a small left pleural effusion. No pneumothorax is identified. The heart size and mediastinal contours are within normal limits. Degenerative changes seen throughout the thoracic spine. IMPRESSION: 1. Evidence of prior median sternotomy/CABG. 2. Mild to moderate severity interstitial edema. Electronically Signed   By: Virgina Norfolk M.D.   On: 10/23/2020 23:19   DG Chest Port 1 View  Result Date: 10/25/2020 CLINICAL DATA:  Respiratory failure EXAM: PORTABLE CHEST 1 VIEW COMPARISON:  October 23, 2020 FINDINGS: There is diffuse interstitial thickening. No edema or consolidation. There is a small left pleural effusion. Heart is borderline enlarged with pulmonary venous hypertension. Status post coronary artery bypass grafting. There is aortic atherosclerosis. No adenopathy. No bone lesions. IMPRESSION:  Borderline cardiac enlargement with a degree of pulmonary vascular congestion. Status post coronary artery bypass grafting. Small left pleural effusion. Interstitium is thickened, likely due to chronic bronchitis with questionable mild chronic interstitial edema. No consolidation. There may be a degree of chronic congestive heart failure. Aortic Atherosclerosis (ICD10-I70.0). Electronically Signed   By: Lowella Grip III M.D.   On: 10/25/2020 11:54    (Echo, Carotid, EGD, Colonoscopy, ERCP)    Subjective: Patient seen and examined on the day of discharge.  Sitting up in chair.  In no visible distress.  Weaned off supplemental oxygen.  Stable for discharge  home.  Discharge Exam: Vitals:   10/28/20 0754 10/28/20 1202  BP: (!) 117/92 (!) 124/58  Pulse: 60 60  Resp: 18 20  Temp: 98 F (36.7 C) 98.2 F (36.8 C)  SpO2: 98% 94%   Vitals:   10/28/20 0534 10/28/20 0705 10/28/20 0754 10/28/20 1202  BP: (!) 143/56  (!) 117/92 (!) 124/58  Pulse: (!) 52  60 60  Resp: 17  18 20   Temp: 98.1 F (36.7 C)  98 F (36.7 C) 98.2 F (36.8 C)  TempSrc: Oral  Oral Oral  SpO2: 95%  98% 94%  Weight:  74.3 kg    Height:        General: Pt is alert, awake, not in acute distress Cardiovascular: Regular rate, irregular rhythm, no murmurs, no pedal edema Respiratory: Mild bibasilar Rales.  Normal work of breathing.  Room air Abdominal: Soft, NT, ND, bowel sounds + Extremities: no edema, no cyanosis    The results of significant diagnostics from this hospitalization (including imaging, microbiology, ancillary and laboratory) are listed below for reference.     Microbiology: Recent Results (from the past 240 hour(s))  Resp Panel by RT-PCR (Flu A&B, Covid) Nasopharyngeal Swab     Status: None   Collection Time: 10/23/20 11:33 PM   Specimen: Nasopharyngeal Swab; Nasopharyngeal(NP) swabs in vial transport medium  Result Value Ref Range Status   SARS Coronavirus 2 by RT PCR NEGATIVE NEGATIVE Final     Comment: (NOTE) SARS-CoV-2 target nucleic acids are NOT DETECTED.  The SARS-CoV-2 RNA is generally detectable in upper respiratory specimens during the acute phase of infection. The lowest concentration of SARS-CoV-2 viral copies this assay can detect is 138 copies/mL. A negative result does not preclude SARS-Cov-2 infection and should not be used as the sole basis for treatment or other patient management decisions. A negative result may occur with  improper specimen collection/handling, submission of specimen other than nasopharyngeal swab, presence of viral mutation(s) within the areas targeted by this assay, and inadequate number of viral copies(<138 copies/mL). A negative result must be combined with clinical observations, patient history, and epidemiological information. The expected result is Negative.  Fact Sheet for Patients:  EntrepreneurPulse.com.au  Fact Sheet for Healthcare Providers:  IncredibleEmployment.be  This test is no t yet approved or cleared by the [place] FDA and  has been authorized for detection and/or diagnosis of SARS-CoV-2 by FDA under an Emergency Use Authorization (EUA). This EUA will remain  in effect (meaning this test can be used) for the duration of the COVID-19 declaration under Section 564(b)(1) of the Act, 21 U.S.C.section 360bbb-3(b)(1), unless the authorization is terminated  or revoked sooner.       Influenza A by PCR NEGATIVE NEGATIVE Final   Influenza B by PCR NEGATIVE NEGATIVE Final    Comment: (NOTE) The Xpert Xpress SARS-CoV-2/FLU/RSV plus assay is intended as an aid in the diagnosis of influenza from Nasopharyngeal swab specimens and should not be used as a sole basis for treatment. Nasal washings and aspirates are unacceptable for Xpert Xpress SARS-CoV-2/FLU/RSV testing.  Fact Sheet for Patients: EntrepreneurPulse.com.au  Fact Sheet for Healthcare  Providers: IncredibleEmployment.be  This test is not yet approved or cleared by the [place] FDA and has been authorized for detection and/or diagnosis of SARS-CoV-2 by FDA under an Emergency Use Authorization (EUA). This EUA will remain in effect (meaning this test can be used) for the duration of the COVID-19 declaration under Section 564(b)(1) of the Act, 21 U.S.C. section 360bbb-3(b)(1), unless the  authorization is terminated or revoked.  Performed at [facility], [address] [place], [place] [address]      Labs: BNP (last 3 results) Recent Labs    06/02/20 0804 07/30/20 1120 10/23/20 2315  BNP 1,179.3* 1,048.1* 1,761.6*   Basic Metabolic Panel: Recent Labs  Lab 10/23/20 2315 10/24/20 0534 10/25/20 0411 10/26/20 0354 10/27/20 0729  NA 137 135 137 137 133*  K 3.9 4.3 4.2 3.8 3.5  CL 94* 97* 98 95* 90*  CO2 30 28 31  33* 33*  GLUCOSE 209* 260* 143* 117* 167*  BUN 30* 34* 44* 49* 53*  CREATININE 2.06* 1.96* 2.05* 2.04* 1.99*  CALCIUM 9.1 8.9 8.7* 8.6* 8.8*  MG 2.4 2.7*  --   --   --    Liver Function Tests: Recent Labs  Lab 10/23/20 2315  AST 17  ALT 15  ALKPHOS 118  BILITOT 0.8  PROT 7.7  ALBUMIN 3.9   No results for input(s): LIPASE, AMYLASE in the last 168 hours. No results for input(s): AMMONIA in the last 168 hours. CBC: Recent Labs  Lab 10/23/20 2315 10/24/20 0534 10/25/20 0411 10/26/20 0354  WBC 12.7* 9.3 13.9* 12.4*  NEUTROABS 10.4*  --   --   --   HGB 13.4 12.6* 11.8* 12.2*  HCT 43.3 39.6 38.1* 39.1  MCV 79.9* 79.0* 80.0 80.0  PLT 282 248 239 253   Cardiac Enzymes: No results for input(s): CKTOTAL, CKMB, CKMBINDEX, TROPONINI in the last 168 hours. BNP: Invalid input(s): POCBNP CBG: Recent Labs  Lab 10/27/20 1202 10/27/20 1709 10/27/20 1958 10/28/20 0017 10/28/20 0432  GLUCAP 233* 196* 281* 234* 153*   D-Dimer No results for input(s): DDIMER in the last 72 hours. Hgb A1c No results for  input(s): HGBA1C in the last 72 hours. Lipid Profile No results for input(s): CHOL, HDL, LDLCALC, TRIG, CHOLHDL, LDLDIRECT in the last 72 hours. Thyroid function studies No results for input(s): TSH, T4TOTAL, T3FREE, THYROIDAB in the last 72 hours.  Invalid input(s): FREET3 Anemia work up No results for input(s): VITAMINB12, FOLATE, FERRITIN, TIBC, IRON, RETICCTPCT in the last 72 hours. Urinalysis    Component Value Date/Time   COLORURINE YELLOW (A) 01/15/2016 2115   APPEARANCEUR CLEAR (A) 01/15/2016 2115   APPEARANCEUR Clear 04/28/2014 2346   LABSPEC 1.010 01/15/2016 2115   LABSPEC 1.009 04/28/2014 2346   PHURINE 5.0 01/15/2016 2115   GLUCOSEU NEGATIVE 01/15/2016 2115   GLUCOSEU Negative 04/28/2014 2346   HGBUR 1+ (A) 01/15/2016 2115   BILIRUBINUR NEGATIVE 01/15/2016 2115   BILIRUBINUR Negative 04/28/2014 2346   [place] 01/15/2016 2115   PROTEINUR 100 (A) 01/15/2016 2115   NITRITE NEGATIVE 01/15/2016 2115   LEUKOCYTESUR TRACE (A) 01/15/2016 2115   LEUKOCYTESUR 1+ 04/28/2014 2346   Sepsis Labs Invalid input(s): PROCALCITONIN,  WBC,  LACTICIDVEN Microbiology Recent Results (from the past 240 hour(s))  Resp Panel by RT-PCR (Flu A&B, Covid) Nasopharyngeal Swab     Status: None   Collection Time: 10/23/20 11:33 PM   Specimen: Nasopharyngeal Swab; Nasopharyngeal(NP) swabs in vial transport medium  Result Value Ref Range Status   SARS Coronavirus 2 by RT PCR NEGATIVE NEGATIVE Final    Comment: (NOTE) SARS-CoV-2 target nucleic acids are NOT DETECTED.  The SARS-CoV-2 RNA is generally detectable in upper respiratory specimens during the acute phase of infection. The lowest concentration of SARS-CoV-2 viral copies this assay can detect is 138 copies/mL. A negative result does not preclude SARS-Cov-2 infection and should not be used as the sole  basis for treatment or other patient management decisions. A negative result may occur with  improper specimen  collection/handling, submission of specimen other than nasopharyngeal swab, presence of viral mutation(s) within the areas targeted by this assay, and inadequate number of viral copies(<138 copies/mL). A negative result must be combined with clinical observations, patient history, and epidemiological information. The expected result is Negative.  Fact Sheet for Patients:  EntrepreneurPulse.com.au  Fact Sheet for Healthcare Providers:  IncredibleEmployment.be  This test is no t yet approved or cleared by the [place] FDA and  has been authorized for detection and/or diagnosis of SARS-CoV-2 by FDA under an Emergency Use Authorization (EUA). This EUA will remain  in effect (meaning this test can be used) for the duration of the COVID-19 declaration under Section 564(b)(1) of the Act, 21 U.S.C.section 360bbb-3(b)(1), unless the authorization is terminated  or revoked sooner.       Influenza A by PCR NEGATIVE NEGATIVE Final   Influenza B by PCR NEGATIVE NEGATIVE Final    Comment: (NOTE) The Xpert Xpress SARS-CoV-2/FLU/RSV plus assay is intended as an aid in the diagnosis of influenza from Nasopharyngeal swab specimens and should not be used as a sole basis for treatment. Nasal washings and aspirates are unacceptable for Xpert Xpress SARS-CoV-2/FLU/RSV testing.  Fact Sheet for Patients: EntrepreneurPulse.com.au  Fact Sheet for Healthcare Providers: IncredibleEmployment.be  This test is not yet approved or cleared by the [place] FDA and has been authorized for detection and/or diagnosis of SARS-CoV-2 by FDA under an Emergency Use Authorization (EUA). This EUA will remain in effect (meaning this test can be used) for the duration of the COVID-19 declaration under Section 564(b)(1) of the Act, 21 U.S.C. section 360bbb-3(b)(1), unless the authorization is terminated or revoked.  Performed at [facility], [address], [place], [place] [address]      Time coordinating discharge: Over 30 minutes  SIGNED:   Sidney Ace, MD  [facility] 10/28/2020, 1:00 PM Pager   If 7PM-7AM, please contact night-coverage

## 2020-11-05 DIAGNOSIS — E1165 Type 2 diabetes mellitus with hyperglycemia: Secondary | ICD-10-CM | POA: Diagnosis not present

## 2020-11-05 DIAGNOSIS — I130 Hypertensive heart and chronic kidney disease with heart failure and stage 1 through stage 4 chronic kidney disease, or unspecified chronic kidney disease: Secondary | ICD-10-CM | POA: Diagnosis not present

## 2020-11-05 DIAGNOSIS — Z7901 Long term (current) use of anticoagulants: Secondary | ICD-10-CM | POA: Diagnosis not present

## 2020-11-05 DIAGNOSIS — E1122 Type 2 diabetes mellitus with diabetic chronic kidney disease: Secondary | ICD-10-CM | POA: Diagnosis not present

## 2020-11-05 DIAGNOSIS — I5043 Acute on chronic combined systolic (congestive) and diastolic (congestive) heart failure: Secondary | ICD-10-CM | POA: Diagnosis not present

## 2020-11-05 DIAGNOSIS — I4891 Unspecified atrial fibrillation: Secondary | ICD-10-CM | POA: Diagnosis not present

## 2020-11-05 DIAGNOSIS — J441 Chronic obstructive pulmonary disease with (acute) exacerbation: Secondary | ICD-10-CM | POA: Diagnosis not present

## 2020-11-05 DIAGNOSIS — M549 Dorsalgia, unspecified: Secondary | ICD-10-CM | POA: Diagnosis not present

## 2020-11-05 DIAGNOSIS — Z8673 Personal history of transient ischemic attack (TIA), and cerebral infarction without residual deficits: Secondary | ICD-10-CM | POA: Diagnosis not present

## 2020-11-05 DIAGNOSIS — N184 Chronic kidney disease, stage 4 (severe): Secondary | ICD-10-CM | POA: Diagnosis not present

## 2020-11-05 DIAGNOSIS — Z794 Long term (current) use of insulin: Secondary | ICD-10-CM | POA: Diagnosis not present

## 2020-11-05 DIAGNOSIS — Z9181 History of falling: Secondary | ICD-10-CM | POA: Diagnosis not present

## 2020-11-05 NOTE — Progress Notes (Signed)
Patient ID: Perseus Westall, male    DOB: 1945/11/04, 75 y.o.   MRN: [id number]  HPI  Mr Keadle is a 75 y/o male with a history of CAD, DM, hyperlipidemia, HTN, stroke, COPD, GERD, PVD, atrial fibrillation, current tobacco use and chronic heart failure.   Echo report from 12/26/19 reviewed and showed an EF of 35-40% along with mild MR and mild mildly elevated PA pressure.   Admitted 10/23/20 due to shortness of breath. Initially needed bipap and supplemental oxygen. Cardiology consult obtained. Given IV lasix with transition to oral diuretics. Weaned off of oxygen. Discharged after 5 days. Was in the ED 06/02/20 due to HF exacerbation where he was treated with IV lasix and released.   He presents today for a follow-up visit with a chief complaint of moderate shortness of breath with minimal exertion. He describes this as chronic in nature having been present for several years although he does report improvement since his recent admission. He has associated fatigue, easy bruising and head congestion along with this. He denies any difficulty sleeping, dizziness, abdominal distention, palpitations, pedal edema, chest pain, cough or weight gain.   Says that his home weight tends to stay in the mid 160's. Glucose was high yesterday evening, thinks it may have been the graham crackers and vanilla wafers that he ate. Had a frozen salisbury steak dinner last night for supper.         Past Medical History:  Diagnosis Date  . Arrhythmia    atrial fibrillation  . Basal cell carcinoma 05/2019   right nasal ala, Tx: EDC  . Basal cell carcinoma 04/12/2019   Left posterior ear. Nodular pattern, excoriated.   . CHF (congestive heart failure) ([place])   . COPD (chronic obstructive pulmonary disease) ([place])   . Coronary artery disease   . Diabetes mellitus without complication ([place])   . GERD (gastroesophageal reflux disease)   . HLD (hyperlipidemia)   . Hypertension   . Peripheral vascular disease ([place])   .  Pneumonia   . Prostate cancer ([place])   . Squamous cell carcinoma of skin 08/10/2018   Left upper arm above elbow. [facility] SCC.  Marland Kitchen Squamous cell carcinoma of skin 09/07/2018   Right forearm, below elbow. WD SCC. [facility] 01/10/2019.  Marland Kitchen Stroke [facility])    Past Surgical History:  Procedure Laterality Date  . COLONOSCOPY    . CORONARY ARTERY BYPASS GRAFT  2006  . EYE SURGERY    . GOLD SEED IMPLANT N/A 12/30/2016   Procedure: GOLD SEED IMPLANT  x3;  Surgeon: Hollice Espy, MD;  Location: [facility] ORS;  Service: Urology;  Laterality: N/A;  . HEMORRHOID SURGERY N/A 10/02/2016   Procedure: PROCTOSCOPY AND CONTROL OF RECTAL BLEEDING;  Surgeon: Excell Seltzer, MD;  Location: [facility] ORS;  Service: General;  Laterality: N/A;  . PROSTATE BIOPSY    . TONSILLECTOMY     Family History  Problem Relation Age of Onset  . Lung cancer Mother   . Heart attack Father   . Hypertension Other   . Cancer Maternal Grandfather        prostate  . Cancer Paternal Grandmother        breast   Social History   Tobacco Use  . Smoking status: Current Every Day Smoker    Packs/day: 1.00    Years: 54.00    Pack years: 54.00    Types: Cigarettes  . Smokeless tobacco: Never Used  Substance Use Topics  . Alcohol use: No  Alcohol/week: 0.0 standard drinks   Allergies  Allergen Reactions  . Lactose Intolerance (Gi)     Past Medical History:  Diagnosis Date  . Arrhythmia    atrial fibrillation  . Basal cell carcinoma 05/2019   right nasal ala, Tx: EDC  . Basal cell carcinoma 04/12/2019   Left posterior ear. Nodular pattern, excoriated.   . CHF (congestive heart failure) ([place])   . COPD (chronic obstructive pulmonary disease) ([place])   . Coronary artery disease   . Diabetes mellitus without complication ([place])   . GERD (gastroesophageal reflux disease)   . HLD (hyperlipidemia)   . Hypertension   . Peripheral vascular disease ([place])   . Pneumonia   . Prostate cancer ([place])   . Squamous cell carcinoma of skin 08/10/2018    Left upper arm above elbow. WD SCC.  Marland Kitchen Squamous cell carcinoma of skin 09/07/2018   Right forearm, below elbow. WD SCC. [facility] 01/10/2019.  Marland Kitchen Stroke [facility])    Past Surgical History:  Procedure Laterality Date  . COLONOSCOPY    . CORONARY ARTERY BYPASS GRAFT  2006  . EYE SURGERY    . GOLD SEED IMPLANT N/A 12/30/2016   Procedure: GOLD SEED IMPLANT  x3;  Surgeon: Hollice Espy, MD;  Location: [facility] ORS;  Service: Urology;  Laterality: N/A;  . HEMORRHOID SURGERY N/A 10/02/2016   Procedure: PROCTOSCOPY AND CONTROL OF RECTAL BLEEDING;  Surgeon: Excell Seltzer, MD;  Location: [facility] ORS;  Service: General;  Laterality: N/A;  . PROSTATE BIOPSY    . TONSILLECTOMY     Family History  Problem Relation Age of Onset  . Lung cancer Mother   . Heart attack Father   . Hypertension Other   . Cancer Maternal Grandfather        prostate  . Cancer Paternal Grandmother        breast   Social History   Tobacco Use  . Smoking status: Current Every Day Smoker    Packs/day: 1.00    Years: 54.00    Pack years: 54.00    Types: Cigarettes  . Smokeless tobacco: Never Used  Substance Use Topics  . Alcohol use: No    Alcohol/week: 0.0 standard drinks   Allergies  Allergen Reactions  . Lactose Intolerance (Gi)    Prior to Admission medications   Medication Sig Start Date End Date Taking? Authorizing Provider  acetaminophen (TYLENOL) 325 MG tablet Take 2 tablets (650 mg total) by mouth every 6 (six) hours as needed for mild pain (or Fever >/= 101). 01/20/16  Yes Gouru, Illene Silver, MD  albuterol (PROVENTIL HFA;VENTOLIN HFA) 108 (90 Base) MCG/ACT inhaler Inhale 2 puffs into the lungs every 6 (six) hours as needed for wheezing or shortness of breath.   Yes [provider]  amLODipine (NORVASC) 5 MG tablet Take 1 tablet (5 mg total) by mouth daily. 12/30/19  Yes Ezekiel Slocumb, DO  apixaban (ELIQUIS) 5 MG TABS tablet Take 1 tablet (5 mg total) by mouth 2 (two) times daily. 03/12/20  Yes Tailynn Armetta A,  FNP  Apoaequorin (PREVAGEN PO) Take by mouth.   Yes [provider]  bifidobacterium infantis (ALIGN) capsule Take 1 capsule by mouth every morning.   Yes [provider]  cholecalciferol (VITAMIN D) 1000 units tablet Take 1,000 Units by mouth daily.   Yes [provider]  Cinnamon 500 MG capsule Take 1,000 mg by mouth 2 (two) times daily.   Yes [provider]  COMBIVENT RESPIMAT 20-100 MCG/ACT AERS respimat Inhale  2 puffs into the lungs 4 (four) times daily as needed. 09/18/20  Yes [provider]  CRANBERRY EXTRACT PO Take 500 mg by mouth every morning.    Yes [provider]  Ensure Max Protein (ENSURE MAX PROTEIN) LIQD Take 330 mLs (11 oz total) by mouth 2 (two) times daily between meals. 12/29/19  Yes Nicole Kindred A, DO  gabapentin (NEURONTIN) 100 MG capsule Take 100 mg by mouth 2 (two) times daily.   Yes [provider]  glucose 4 GM chewable tablet Chew 1 tablet by mouth as needed for low blood sugar.   Yes [provider]  hydrALAZINE (APRESOLINE) 50 MG tablet Take 1 tablet (50 mg total) by mouth every 8 (eight) hours. 10/15/19  Yes Ghimire, Henreitta Leber, MD  insulin aspart (NOVOLOG) 100 UNIT/ML injection 0-9 Units, Subcutaneous, 3 times daily with meals CBG < 70: Implement Hypoglycemia protocol/measures CBG 70 - 120: 0 units CBG 121 - 150: 1 unit CBG 151 - 200: 2 units CBG 201 - 250: 3 units CBG 251 - 300: 5 units CBG 301 - 350: 7 units CBG 351 - 400: 9 units CBG > 400: call MD Patient taking differently: Inject 5 Units into the skin 3 (three) times daily with meals. 10/15/19  Yes Ghimire, Henreitta Leber, MD  isosorbide mononitrate (IMDUR) 30 MG 24 hr tablet Take 1 tablet (30 mg total) by mouth daily. 10/15/19  Yes Ghimire, Henreitta Leber, MD  lactase (LACTAID) 3000 units tablet Take 3,000 Units by mouth 3 (three) times daily between meals as needed.   Yes [provider]  LANTUS SOLOSTAR 100 UNIT/ML Solostar Pen  Inject 45 Units into the skin daily at 10 pm. 10/15/19  Yes Ghimire, Henreitta Leber, MD  Magnesium 250 MG TABS Take 250 mg by mouth daily.   Yes [provider]  Misc Natural Products (TOTAL MEMORY & FOCUS FORMULA PO) Take 1 capsule by mouth See admin instructions. Take 2 capsule at 9:00 AM, 1 capsule at 5:00 PM, and 1 capsule at bedtime.   Yes [provider]  Multiple Vitamin (MULTIVITAMIN WITH MINERALS) TABS tablet Take 1 tablet by mouth daily.   Yes [provider]  NON FORMULARY Take 1 Dose by mouth 3 (three) times daily. Relief Factor OTC   Yes [provider]  omeprazole (PRILOSEC) 20 MG capsule Take 20 mg by mouth daily.   Yes [provider]  potassium chloride (K-DUR) 10 MEQ tablet Take 10 mEq by mouth daily.   Yes [provider]  simvastatin (ZOCOR) 20 MG tablet Take 20 mg by mouth at bedtime.   Yes [provider]  SUPER B COMPLEX/C PO Take 1 tablet by mouth every morning.   Yes [provider]  torsemide (DEMADEX) 20 MG tablet Take 2 tablets (40 mg total) by mouth daily. 10/28/20 11/27/20 Yes Sreenath, Sudheer B, MD  traZODone (DESYREL) 50 MG tablet Take 50 mg by mouth at bedtime.   Yes [provider]  vitamin C (ASCORBIC ACID) 500 MG tablet Take 500 mg by mouth 2 (two) times daily.    Yes [provider]     Review of Systems  Constitutional: Positive for fatigue. Negative for appetite change.  HENT: Positive for congestion. Negative for postnasal drip and sore throat.   Eyes: Negative.   Respiratory: Positive for shortness of breath (easily). Negative for cough and chest tightness.   Cardiovascular: Negative for chest pain, palpitations and leg swelling.  Gastrointestinal: Negative for abdominal distention and  abdominal pain.  Endocrine: Negative.   Genitourinary: Negative.   Musculoskeletal: Negative for back pain and neck pain.  Skin: Negative.   Allergic/Immunologic: Negative.    Neurological: Negative.  Negative for dizziness and light-headedness.  Hematological: Negative for adenopathy. Bruises/bleeds easily.  Psychiatric/Behavioral: Negative for dysphoric mood and sleep disturbance (sleeping on 1 pillow). The patient is not nervous/anxious.    Vitals:   11/07/20 0940  BP: 104/68  Pulse: 68  Resp: 18  Weight: 164 lb (74.4 kg)  Height: 5\' 6"  (1.676 m)   Wt Readings from Last 3 Encounters:  11/07/20 164 lb (74.4 kg)  10/28/20 163 lb 12.8 oz (74.3 kg)  09/24/20 169 lb 2 oz (76.7 kg)   Lab Results  Component Value Date   CREATININE 1.99 (H) 10/27/2020   CREATININE 2.04 (H) 10/26/2020   CREATININE 2.05 (H) 10/25/2020    Physical Exam Vitals and nursing note reviewed.  Constitutional:      Appearance: Normal appearance.  HENT:     Head: Normocephalic and atraumatic.  Cardiovascular:     Rate and Rhythm: Normal rate. Rhythm irregular.  Pulmonary:     Effort: Pulmonary effort is normal.     Breath sounds: No wheezing or rales.  Abdominal:     General: Abdomen is flat. There is no distension.     Palpations: Abdomen is soft.  Musculoskeletal:        General: No tenderness.     Cervical back: Normal range of motion and neck supple.     Right lower leg: No edema.     Left lower leg: No tenderness. Edema (trace pitting) present.  Skin:    General: Skin is warm and dry.  Neurological:     General: No focal deficit present.     Mental Status: He is alert and oriented to person, place, and time.  Psychiatric:        Mood and Affect: Mood normal.        Behavior: Behavior normal.        Thought Content: Thought content normal.    Assessment & Plan:  1: Chronic heart failure with reduced ejection fraction- - NYHA class III - euvolemic today - weighing daily; reminded to call for an overnight weight gain of >2 pounds or a weekly weight gain of >5 pounds - weight down 5 pounds from last visit here 6 weeks ago - says that he likes salt but has  tried to not add it; says that he used to add it where his food looked white; does not like Mrs[employer] Dash seasoning - ate a frozen salisbury steak dinner last night and reviewed that those foods are high in sodium - saw cardiology (Fath) 10/03/20 & returns 11/09/20; message sent to cardiology office letting them know we just saw patient today; advised patient to plan on going to the appointment unless cardiology office calls them to change it - BNP 10/23/20 was 1001.5 - currently not planning to get COVID vaccines - reports receiving his flu vaccine for this season  2: HTN- - BP looks good today - saw PCP Baldemar Lenis) 08/20/20 & returns tomorrow - BMP 10/27/20 reviewed and showed sodium 133, potassium 3.5, creatinine 1.99 and GFR 34  3: DM- - A1c 10/24/20 was 7.6% - glucose at home yesterday evening was 320; did have some graham crackers and vanilla wafers yesterday  4: Tobacco use- - smokes ~ 1 ppd of cigarettes - doesn't have a huge desire to quit smoking - complete cessation discussed  for 3 minutes with him   Medication bottles reviewed  Keep already scheduled appt November 2022 unless any questions/ issues arise. He was then advised to call us back and we can get an earlier appointment scheduled.

## 2020-11-07 ENCOUNTER — Other Ambulatory Visit: Payer: Self-pay

## 2020-11-07 ENCOUNTER — Encounter: Payer: Self-pay | Admitting: Family

## 2020-11-07 ENCOUNTER — Ambulatory Visit: Payer: PPO | Attending: Family | Admitting: Family

## 2020-11-07 VITALS — BP 104/68 | HR 68 | Resp 18 | Ht 66.0 in | Wt 164.0 lb

## 2020-11-07 DIAGNOSIS — I4891 Unspecified atrial fibrillation: Secondary | ICD-10-CM | POA: Diagnosis not present

## 2020-11-07 DIAGNOSIS — J449 Chronic obstructive pulmonary disease, unspecified: Secondary | ICD-10-CM | POA: Insufficient documentation

## 2020-11-07 DIAGNOSIS — E785 Hyperlipidemia, unspecified: Secondary | ICD-10-CM | POA: Diagnosis not present

## 2020-11-07 DIAGNOSIS — I110 Hypertensive heart disease with heart failure: Secondary | ICD-10-CM | POA: Diagnosis not present

## 2020-11-07 DIAGNOSIS — E1151 Type 2 diabetes mellitus with diabetic peripheral angiopathy without gangrene: Secondary | ICD-10-CM | POA: Insufficient documentation

## 2020-11-07 DIAGNOSIS — F17210 Nicotine dependence, cigarettes, uncomplicated: Secondary | ICD-10-CM | POA: Insufficient documentation

## 2020-11-07 DIAGNOSIS — Z7901 Long term (current) use of anticoagulants: Secondary | ICD-10-CM | POA: Diagnosis not present

## 2020-11-07 DIAGNOSIS — Z79899 Other long term (current) drug therapy: Secondary | ICD-10-CM | POA: Insufficient documentation

## 2020-11-07 DIAGNOSIS — I2510 Atherosclerotic heart disease of native coronary artery without angina pectoris: Secondary | ICD-10-CM | POA: Insufficient documentation

## 2020-11-07 DIAGNOSIS — I10 Essential (primary) hypertension: Secondary | ICD-10-CM

## 2020-11-07 DIAGNOSIS — E1122 Type 2 diabetes mellitus with diabetic chronic kidney disease: Secondary | ICD-10-CM

## 2020-11-07 DIAGNOSIS — K219 Gastro-esophageal reflux disease without esophagitis: Secondary | ICD-10-CM | POA: Diagnosis not present

## 2020-11-07 DIAGNOSIS — Z794 Long term (current) use of insulin: Secondary | ICD-10-CM | POA: Insufficient documentation

## 2020-11-07 DIAGNOSIS — Z8673 Personal history of transient ischemic attack (TIA), and cerebral infarction without residual deficits: Secondary | ICD-10-CM | POA: Diagnosis not present

## 2020-11-07 DIAGNOSIS — Z720 Tobacco use: Secondary | ICD-10-CM

## 2020-11-07 DIAGNOSIS — Z8249 Family history of ischemic heart disease and other diseases of the circulatory system: Secondary | ICD-10-CM | POA: Diagnosis not present

## 2020-11-07 DIAGNOSIS — I5022 Chronic systolic (congestive) heart failure: Secondary | ICD-10-CM

## 2020-11-07 NOTE — Patient Instructions (Signed)
Continue weighing daily and call for an overnight weight gain of > 2 pounds or a weekly weight gain of >5 pounds. 

## 2020-11-08 DIAGNOSIS — J018 Other acute sinusitis: Secondary | ICD-10-CM | POA: Diagnosis not present

## 2020-11-08 DIAGNOSIS — J9601 Acute respiratory failure with hypoxia: Secondary | ICD-10-CM | POA: Diagnosis not present

## 2020-11-08 DIAGNOSIS — N1832 Chronic kidney disease, stage 3b: Secondary | ICD-10-CM | POA: Diagnosis not present

## 2020-11-08 DIAGNOSIS — I5023 Acute on chronic systolic (congestive) heart failure: Secondary | ICD-10-CM | POA: Diagnosis not present

## 2020-11-15 ENCOUNTER — Other Ambulatory Visit: Payer: Self-pay

## 2020-11-15 ENCOUNTER — Emergency Department: Payer: PPO

## 2020-11-15 ENCOUNTER — Encounter: Payer: Self-pay | Admitting: Emergency Medicine

## 2020-11-15 ENCOUNTER — Inpatient Hospital Stay
Admission: EM | Admit: 2020-11-15 | Discharge: 2020-11-19 | DRG: 291 | Disposition: A | Discharge status: Home Health | Payer: PPO | Attending: Internal Medicine | Admitting: Internal Medicine

## 2020-11-15 ENCOUNTER — Inpatient Hospital Stay
Admit: 2020-11-15 | Discharge: 2020-11-15 | Disposition: A | Discharge status: Home / Self Care | Payer: PPO | Attending: Family Medicine | Admitting: Family Medicine

## 2020-11-15 DIAGNOSIS — J449 Chronic obstructive pulmonary disease, unspecified: Secondary | ICD-10-CM | POA: Diagnosis present

## 2020-11-15 DIAGNOSIS — Z8673 Personal history of transient ischemic attack (TIA), and cerebral infarction without residual deficits: Secondary | ICD-10-CM | POA: Diagnosis not present

## 2020-11-15 DIAGNOSIS — Z8616 Personal history of COVID-19: Secondary | ICD-10-CM

## 2020-11-15 DIAGNOSIS — N1832 Chronic kidney disease, stage 3b: Secondary | ICD-10-CM | POA: Diagnosis not present

## 2020-11-15 DIAGNOSIS — I2510 Atherosclerotic heart disease of native coronary artery without angina pectoris: Secondary | ICD-10-CM | POA: Diagnosis not present

## 2020-11-15 DIAGNOSIS — E785 Hyperlipidemia, unspecified: Secondary | ICD-10-CM | POA: Diagnosis present

## 2020-11-15 DIAGNOSIS — U071 COVID-19: Secondary | ICD-10-CM | POA: Diagnosis not present

## 2020-11-15 DIAGNOSIS — Z8546 Personal history of malignant neoplasm of prostate: Secondary | ICD-10-CM | POA: Diagnosis not present

## 2020-11-15 DIAGNOSIS — Z7901 Long term (current) use of anticoagulants: Secondary | ICD-10-CM | POA: Diagnosis not present

## 2020-11-15 DIAGNOSIS — Z794 Long term (current) use of insulin: Secondary | ICD-10-CM | POA: Diagnosis not present

## 2020-11-15 DIAGNOSIS — Z8249 Family history of ischemic heart disease and other diseases of the circulatory system: Secondary | ICD-10-CM

## 2020-11-15 DIAGNOSIS — F17210 Nicotine dependence, cigarettes, uncomplicated: Secondary | ICD-10-CM | POA: Diagnosis not present

## 2020-11-15 DIAGNOSIS — E1142 Type 2 diabetes mellitus with diabetic polyneuropathy: Secondary | ICD-10-CM | POA: Diagnosis present

## 2020-11-15 DIAGNOSIS — I130 Hypertensive heart and chronic kidney disease with heart failure and stage 1 through stage 4 chronic kidney disease, or unspecified chronic kidney disease: Secondary | ICD-10-CM | POA: Diagnosis not present

## 2020-11-15 DIAGNOSIS — Z85828 Personal history of other malignant neoplasm of skin: Secondary | ICD-10-CM

## 2020-11-15 DIAGNOSIS — I5023 Acute on chronic systolic (congestive) heart failure: Secondary | ICD-10-CM | POA: Diagnosis not present

## 2020-11-15 DIAGNOSIS — I509 Heart failure, unspecified: Secondary | ICD-10-CM | POA: Diagnosis not present

## 2020-11-15 DIAGNOSIS — Z801 Family history of malignant neoplasm of trachea, bronchus and lung: Secondary | ICD-10-CM | POA: Diagnosis not present

## 2020-11-15 DIAGNOSIS — R0689 Other abnormalities of breathing: Secondary | ICD-10-CM | POA: Diagnosis not present

## 2020-11-15 DIAGNOSIS — J9602 Acute respiratory failure with hypercapnia: Secondary | ICD-10-CM | POA: Diagnosis not present

## 2020-11-15 DIAGNOSIS — I959 Hypotension, unspecified: Secondary | ICD-10-CM | POA: Diagnosis not present

## 2020-11-15 DIAGNOSIS — E1122 Type 2 diabetes mellitus with diabetic chronic kidney disease: Secondary | ICD-10-CM | POA: Diagnosis present

## 2020-11-15 DIAGNOSIS — R0602 Shortness of breath: Secondary | ICD-10-CM | POA: Diagnosis not present

## 2020-11-15 DIAGNOSIS — E739 Lactose intolerance, unspecified: Secondary | ICD-10-CM | POA: Diagnosis present

## 2020-11-15 DIAGNOSIS — I10 Essential (primary) hypertension: Secondary | ICD-10-CM

## 2020-11-15 DIAGNOSIS — I5031 Acute diastolic (congestive) heart failure: Secondary | ICD-10-CM

## 2020-11-15 DIAGNOSIS — I482 Chronic atrial fibrillation, unspecified: Secondary | ICD-10-CM | POA: Diagnosis not present

## 2020-11-15 DIAGNOSIS — K219 Gastro-esophageal reflux disease without esophagitis: Secondary | ICD-10-CM | POA: Diagnosis present

## 2020-11-15 DIAGNOSIS — N189 Chronic kidney disease, unspecified: Secondary | ICD-10-CM

## 2020-11-15 DIAGNOSIS — I5043 Acute on chronic combined systolic (congestive) and diastolic (congestive) heart failure: Secondary | ICD-10-CM | POA: Diagnosis present

## 2020-11-15 DIAGNOSIS — J9601 Acute respiratory failure with hypoxia: Secondary | ICD-10-CM | POA: Diagnosis present

## 2020-11-15 DIAGNOSIS — R0600 Dyspnea, unspecified: Secondary | ICD-10-CM

## 2020-11-15 DIAGNOSIS — E1151 Type 2 diabetes mellitus with diabetic peripheral angiopathy without gangrene: Secondary | ICD-10-CM | POA: Diagnosis present

## 2020-11-15 DIAGNOSIS — Z951 Presence of aortocoronary bypass graft: Secondary | ICD-10-CM

## 2020-11-15 DIAGNOSIS — I5021 Acute systolic (congestive) heart failure: Secondary | ICD-10-CM | POA: Diagnosis not present

## 2020-11-15 DIAGNOSIS — R0902 Hypoxemia: Secondary | ICD-10-CM | POA: Diagnosis not present

## 2020-11-15 HISTORY — DX: Unspecified atrial fibrillation: I48.91

## 2020-11-15 LAB — CBC WITH DIFFERENTIAL/PLATELET
Abs Immature Granulocytes: 0.05 10*3/uL (ref 0.00–0.07)
Basophils Absolute: 0 10*3/uL (ref 0.0–0.1)
Basophils Relative: 0 %
Eosinophils Absolute: 0.1 10*3/uL (ref 0.0–0.5)
Eosinophils Relative: 1 %
HCT: 37.2 % — ABNORMAL LOW (ref 39.0–52.0)
Hemoglobin: 11.5 g/dL — ABNORMAL LOW (ref 13.0–17.0)
Immature Granulocytes: 1 %
Lymphocytes Relative: 13 %
Lymphs Abs: 1.3 10*3/uL (ref 0.7–4.0)
MCH: 24.3 pg — ABNORMAL LOW (ref 26.0–34.0)
MCHC: 30.9 g/dL (ref 30.0–36.0)
MCV: 78.6 fL — ABNORMAL LOW (ref 80.0–100.0)
Monocytes Absolute: 0.8 10*3/uL (ref 0.1–1.0)
Monocytes Relative: 8 %
Neutro Abs: 7.9 10*3/uL — ABNORMAL HIGH (ref 1.7–7.7)
Neutrophils Relative %: 77 %
Platelets: 262 10*3/uL (ref 150–400)
RBC: 4.73 MIL/uL (ref 4.22–5.81)
RDW: 17.9 % — ABNORMAL HIGH (ref 11.5–15.5)
WBC: 10.1 10*3/uL (ref 4.0–10.5)
nRBC: 0 % (ref 0.0–0.2)

## 2020-11-15 LAB — LIPASE, BLOOD: Lipase: 20 U/L (ref 11–51)

## 2020-11-15 LAB — COMPREHENSIVE METABOLIC PANEL
ALT: 10 U/L (ref 0–44)
AST: 14 U/L — ABNORMAL LOW (ref 15–41)
Albumin: 3.1 g/dL — ABNORMAL LOW (ref 3.5–5.0)
Alkaline Phosphatase: 92 U/L (ref 38–126)
Anion gap: 10 (ref 5–15)
BUN: 41 mg/dL — ABNORMAL HIGH (ref 8–23)
CO2: 25 mmol/L (ref 22–32)
Calcium: 8.5 mg/dL — ABNORMAL LOW (ref 8.9–10.3)
Chloride: 97 mmol/L — ABNORMAL LOW (ref 98–111)
Creatinine, Ser: 2.29 mg/dL — ABNORMAL HIGH (ref 0.61–1.24)
GFR, Estimated: 29 mL/min — ABNORMAL LOW (ref >60)
Glucose, Bld: 241 mg/dL — ABNORMAL HIGH (ref 70–99)
Potassium: 4.2 mmol/L (ref 3.5–5.1)
Sodium: 132 mmol/L — ABNORMAL LOW (ref 135–145)
Total Bilirubin: 0.9 mg/dL (ref 0.3–1.2)
Total Protein: 6.3 g/dL — ABNORMAL LOW (ref 6.5–8.1)

## 2020-11-15 LAB — ECHOCARDIOGRAM COMPLETE
AR max vel: 1.59 cm2
AV Area VTI: 2.22 cm2
AV Area mean vel: 1.89 cm2
AV Mean grad: 7 mmHg
AV Peak grad: 18 mmHg
Ao pk vel: 2.12 m/s
Area-P 1/2: 5.21 cm2
Height: 66 in
MV VTI: 3.1 cm2
S' Lateral: 3.9 cm
Weight: 2640 oz

## 2020-11-15 LAB — BRAIN NATRIURETIC PEPTIDE: B Natriuretic Peptide: 994.6 pg/mL — ABNORMAL HIGH (ref 0.0–100.0)

## 2020-11-15 LAB — RESP PANEL BY RT-PCR (FLU A&B, COVID) ARPGX2
Influenza A by PCR: NEGATIVE
Influenza B by PCR: NEGATIVE
SARS Coronavirus 2 by RT PCR: NEGATIVE

## 2020-11-15 LAB — TROPONIN I (HIGH SENSITIVITY)
Troponin I (High Sensitivity): 41 ng/L — ABNORMAL HIGH (ref <18)
Troponin I (High Sensitivity): 39 ng/L — ABNORMAL HIGH (ref <18)

## 2020-11-15 LAB — TSH: TSH: 5.106 u[IU]/mL — ABNORMAL HIGH (ref 0.350–4.500)

## 2020-11-15 LAB — POC SARS CORONAVIRUS 2 AG -  ED: SARS Coronavirus 2 Ag: NEGATIVE

## 2020-11-15 MED ORDER — ENOXAPARIN SODIUM 40 MG/0.4ML ~~LOC~~ SOLN: 40.0000 mg | SUBCUTANEOUS | Status: DC

## 2020-11-15 MED ORDER — MAGNESIUM HYDROXIDE 400 MG/5ML PO SUSP: 30.0000 mL | Freq: Every day | ORAL | Status: DC | PRN

## 2020-11-15 MED ORDER — APIXABAN 5 MG PO TABS
5.0000 mg | ORAL_TABLET | Freq: Two times a day (BID) | ORAL | Status: DC
  Administered 2020-11-15 – 2020-11-19 (×9): 5 mg via ORAL
  Filled 2020-11-15 (×9): qty 1

## 2020-11-15 MED ORDER — ADULT MULTIVITAMIN W/MINERALS CH
1.0000 | ORAL_TABLET | Freq: Every day | ORAL | Status: DC
  Administered 2020-11-15 – 2020-11-19 (×5): 1 via ORAL
  Filled 2020-11-15 (×5): qty 1

## 2020-11-15 MED ORDER — ALBUTEROL SULFATE HFA 108 (90 BASE) MCG/ACT IN AERS
2.0000 | INHALATION_SPRAY | Freq: Four times a day (QID) | RESPIRATORY_TRACT | Status: DC | PRN
  Filled 2020-11-15: qty 6.7

## 2020-11-15 MED ORDER — TRAZODONE HCL 50 MG PO TABS
50.0000 mg | ORAL_TABLET | Freq: Every day | ORAL | Status: DC
  Administered 2020-11-15 – 2020-11-18 (×4): 50 mg via ORAL
  Filled 2020-11-15 (×4): qty 1

## 2020-11-15 MED ORDER — ACETAMINOPHEN 650 MG RE SUPP: 650.0000 mg | Freq: Four times a day (QID) | RECTAL | Status: DC | PRN

## 2020-11-15 MED ORDER — ACETAMINOPHEN 325 MG PO TABS: 650.0000 mg | ORAL_TABLET | Freq: Four times a day (QID) | ORAL | Status: DC | PRN

## 2020-11-15 MED ORDER — POTASSIUM CHLORIDE ER 10 MEQ PO TBCR
10.0000 meq | EXTENDED_RELEASE_TABLET | Freq: Every day | ORAL | Status: DC
  Administered 2020-11-16 – 2020-11-19 (×4): 10 meq via ORAL
  Filled 2020-11-15 (×9): qty 1

## 2020-11-15 MED ORDER — LACTASE 3000 UNITS PO TABS
3000.0000 [IU] | ORAL_TABLET | Freq: Three times a day (TID) | ORAL | Status: DC
  Administered 2020-11-15 – 2020-11-19 (×11): 3000 [IU] via ORAL
  Filled 2020-11-15 (×16): qty 1

## 2020-11-15 MED ORDER — HYDRALAZINE HCL 50 MG PO TABS
50.0000 mg | ORAL_TABLET | Freq: Three times a day (TID) | ORAL | Status: DC
  Administered 2020-11-15 – 2020-11-19 (×13): 50 mg via ORAL
  Filled 2020-11-15 (×13): qty 1

## 2020-11-15 MED ORDER — FUROSEMIDE 10 MG/ML IJ SOLN
40.0000 mg | Freq: Two times a day (BID) | INTRAMUSCULAR | Status: DC
  Administered 2020-11-15 – 2020-11-19 (×8): 40 mg via INTRAVENOUS
  Filled 2020-11-15 (×7): qty 4

## 2020-11-15 MED ORDER — INSULIN GLARGINE 100 UNIT/ML ~~LOC~~ SOLN
45.0000 [IU] | Freq: Every day | SUBCUTANEOUS | Status: DC
  Administered 2020-11-16 – 2020-11-18 (×3): 45 [IU] via SUBCUTANEOUS
  Filled 2020-11-15 (×6): qty 0.45

## 2020-11-15 MED ORDER — ONDANSETRON HCL 4 MG PO TABS: 4.0000 mg | ORAL_TABLET | Freq: Four times a day (QID) | ORAL | Status: DC | PRN

## 2020-11-15 MED ORDER — TRAZODONE HCL 50 MG PO TABS: 25.0000 mg | ORAL_TABLET | Freq: Every evening | ORAL | Status: DC | PRN

## 2020-11-15 MED ORDER — ASCORBIC ACID 500 MG PO TABS
500.0000 mg | ORAL_TABLET | Freq: Two times a day (BID) | ORAL | Status: DC
  Administered 2020-11-15 – 2020-11-19 (×9): 500 mg via ORAL
  Filled 2020-11-15 (×9): qty 1

## 2020-11-15 MED ORDER — PERFLUTREN LIPID MICROSPHERE
1.0000 mL | INTRAVENOUS | Status: AC | PRN
  Administered 2020-11-15: 2 mL via INTRAVENOUS
  Filled 2020-11-15: qty 10

## 2020-11-15 MED ORDER — INSULIN GLARGINE 100 UNIT/ML SOLOSTAR PEN: 45.0000 [IU] | PEN_INJECTOR | Freq: Every day | SUBCUTANEOUS | Status: DC

## 2020-11-15 MED ORDER — VITAMIN D 25 MCG (1000 UNIT) PO TABS
1000.0000 [IU] | ORAL_TABLET | Freq: Every day | ORAL | Status: DC
  Administered 2020-11-15 – 2020-11-19 (×5): 1000 [IU] via ORAL
  Filled 2020-11-15 (×5): qty 1

## 2020-11-15 MED ORDER — GABAPENTIN 100 MG PO CAPS
100.0000 mg | ORAL_CAPSULE | Freq: Two times a day (BID) | ORAL | Status: DC
  Administered 2020-11-15 – 2020-11-19 (×9): 100 mg via ORAL
  Filled 2020-11-15 (×9): qty 1

## 2020-11-15 MED ORDER — ONDANSETRON HCL 4 MG/2ML IJ SOLN: 4.0000 mg | Freq: Four times a day (QID) | INTRAMUSCULAR | Status: DC | PRN

## 2020-11-15 MED ORDER — MAGNESIUM OXIDE 400 (241.3 MG) MG PO TABS
400.0000 mg | ORAL_TABLET | Freq: Every day | ORAL | Status: DC
  Administered 2020-11-15 – 2020-11-19 (×5): 400 mg via ORAL
  Filled 2020-11-15 (×5): qty 1

## 2020-11-15 MED ORDER — PANTOPRAZOLE SODIUM 40 MG PO TBEC
40.0000 mg | DELAYED_RELEASE_TABLET | Freq: Every day | ORAL | Status: DC
  Administered 2020-11-15 – 2020-11-19 (×5): 40 mg via ORAL
  Filled 2020-11-15 (×5): qty 1

## 2020-11-15 MED ORDER — ENSURE MAX PROTEIN PO LIQD
11.0000 [oz_av] | Freq: Two times a day (BID) | ORAL | Status: DC
  Administered 2020-11-15 – 2020-11-18 (×6): 11 [oz_av] via ORAL
  Filled 2020-11-15: qty 330

## 2020-11-15 MED ORDER — IPRATROPIUM-ALBUTEROL 20-100 MCG/ACT IN AERS
2.0000 | INHALATION_SPRAY | Freq: Four times a day (QID) | RESPIRATORY_TRACT | Status: DC | PRN
  Filled 2020-11-15: qty 4

## 2020-11-15 MED ORDER — MAGNESIUM 250 MG PO TABS: 250.0000 mg | ORAL_TABLET | Freq: Every day | ORAL | Status: DC

## 2020-11-15 MED ORDER — RISAQUAD PO CAPS
1.0000 | ORAL_CAPSULE | Freq: Every morning | ORAL | Status: DC
  Administered 2020-11-15 – 2020-11-19 (×5): 1 via ORAL
  Filled 2020-11-15 (×5): qty 1

## 2020-11-15 MED ORDER — ISOSORBIDE MONONITRATE ER 30 MG PO TB24
30.0000 mg | ORAL_TABLET | Freq: Every day | ORAL | Status: DC
  Administered 2020-11-15 – 2020-11-19 (×5): 30 mg via ORAL
  Filled 2020-11-15 (×5): qty 1

## 2020-11-15 MED ORDER — FUROSEMIDE 10 MG/ML IJ SOLN
40.0000 mg | Freq: Once | INTRAMUSCULAR | Status: AC
  Administered 2020-11-15: 40 mg via INTRAVENOUS
  Filled 2020-11-15: qty 4

## 2020-11-15 MED ORDER — AMLODIPINE BESYLATE 5 MG PO TABS
5.0000 mg | ORAL_TABLET | Freq: Every day | ORAL | Status: DC
  Administered 2020-11-15 – 2020-11-19 (×5): 5 mg via ORAL
  Filled 2020-11-15 (×5): qty 1

## 2020-11-15 MED ORDER — SIMVASTATIN 20 MG PO TABS
20.0000 mg | ORAL_TABLET | Freq: Every day | ORAL | Status: DC
  Administered 2020-11-15 – 2020-11-18 (×4): 20 mg via ORAL
  Filled 2020-11-15 (×3): qty 1
  Filled 2020-11-15: qty 2

## 2020-11-15 NOTE — ED Provider Notes (Signed)
[facility] Emergency Department Provider Note  ____________________________________________   Event Date/Time   First MD Initiated Contact with Patient 11/15/20 0151     (approximate)  I have reviewed the triage vital signs and the nursing notes.   HISTORY  Chief Complaint Shortness of Breath    HPI [name] is a 75 y.o. male with medical history as listed below who presents for evaluation of gradually worsening shortness of breath over the last few days that became severe tonight.  He is much worse when he lies flat and has to sit up.  He felt like he did not get any earlier.  He has had some increased leg swelling from baseline.  He said he has taken some extra doses of torsemide but it is not seem to help.  He denies fever, sore throat, chest pain, nausea, vomiting, and abdominal pain.  He was admitted to the hospital for similar symptoms 3 to 4 weeks ago.  He sees Darylene Price at the heart failure clinic.  When his shortness of breath got so much worse tonight he called EMS.  Nothing particular makes the symptoms better.         Past Medical History:  Diagnosis Date  . Arrhythmia    atrial fibrillation  . Atrial fibrillation ([place])   . Basal cell carcinoma 05/2019   right nasal ala, Tx: EDC  . Basal cell carcinoma 04/12/2019   Left posterior ear. Nodular pattern, excoriated.   . CHF (congestive heart failure) ([place])   . COPD (chronic obstructive pulmonary disease) ([place])   . Coronary artery disease   . Diabetes mellitus without complication ([place])   . GERD (gastroesophageal reflux disease)   . HLD (hyperlipidemia)   . Hypertension   . Peripheral vascular disease ([place])   . Pneumonia   . Prostate cancer ([place])   . Squamous cell carcinoma of skin 08/10/2018   Left upper arm above elbow. [facility].  Marland Kitchen Squamous cell carcinoma of skin 09/07/2018   Right forearm, below elbow. [facility]. [facility] 01/10/2019.  Marland Kitchen Stroke [facility])     Patient Active Problem List    Diagnosis Date Noted  . Acute CHF (congestive heart failure) ([place]) 11/15/2020  . Acute on chronic combined systolic and diastolic CHF (congestive heart failure) ([place]) 10/24/2020  . Elevated troponin 10/24/2020  . CKD (chronic kidney disease), stage IV ([place]) 10/24/2020  . Malnutrition of moderate degree 12/27/2019  . Hypertensive emergency 12/26/2019  . Hyperglycemia due to type 2 diabetes mellitus ([place]) 12/26/2019  . Chronic kidney disease (CKD) stage G3b/A3, moderately decreased glomerular filtration rate (GFR) between 30-44 mL/min/1.73 square meter and albuminuria creatinine ratio greater than 300 mg/g (HCC) 12/26/2019  . History of COVID-19 12/26/2019  . History of CVA (cerebrovascular accident) 12/26/2019  . 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 History of second degree heart block 12/26/2019  . Elevated LFTs 12/26/2019  . Acute systolic CHF (congestive heart failure) ([place]) 10/11/2019  . Malignant neoplasm of prostate ([place]) 11/24/2016  . Bright red rectal bleeding 10/02/2016  . COPD with acute exacerbation ([place]) 01/17/2016  . Chronic systolic heart failure ([place]) 12/12/2015  . Essential hypertension 12/12/2015  . COPD (chronic obstructive pulmonary disease) with chronic bronchitis ([place]) 12/12/2015  . Diabetes ([place]) 12/12/2015  . Tobacco abuse 12/12/2015  . Acute respiratory failure with hypoxia ([place]) 12/03/2015    Past Surgical History:  Procedure Laterality Date  . COLONOSCOPY    . CORONARY ARTERY BYPASS GRAFT  2006  . EYE SURGERY    .  GOLD SEED IMPLANT N/A 12/30/2016   Procedure: GOLD SEED IMPLANT  x3;  Surgeon: Hollice Espy, MD;  Location: [facility] ORS;  Service: Urology;  Laterality: N/A;  . HEMORRHOID SURGERY N/A 10/02/2016   Procedure: PROCTOSCOPY AND CONTROL OF RECTAL BLEEDING;  Surgeon: Excell Seltzer, MD;  Location: [facility] ORS;  Service: General;  Laterality: N/A;  . PROSTATE BIOPSY    . TONSILLECTOMY      Prior to Admission medications   Medication Sig Start Date End Date Taking?  Authorizing Provider  acetaminophen (TYLENOL) 325 MG tablet Take 2 tablets (650 mg total) by mouth every 6 (six) hours as needed for mild pain (or Fever >/= 101). 01/20/16   Gouru, Illene Silver, MD  albuterol (PROVENTIL HFA;VENTOLIN HFA) 108 (90 Base) MCG/ACT inhaler Inhale 2 puffs into the lungs every 6 (six) hours as needed for wheezing or shortness of breath.    [provider]  amLODipine (NORVASC) 5 MG tablet Take 1 tablet (5 mg total) by mouth daily. 12/30/19   Ezekiel Slocumb, DO  apixaban (ELIQUIS) 5 MG TABS tablet Take 1 tablet (5 mg total) by mouth 2 (two) times daily. 03/12/20   Alisa Graff, FNP  Apoaequorin (PREVAGEN PO) Take by mouth.    [provider]  bifidobacterium infantis (ALIGN) capsule Take 1 capsule by mouth every morning.    [provider]  cholecalciferol (VITAMIN D) 1000 units tablet Take 1,000 Units by mouth daily.    [provider]  Cinnamon 500 MG capsule Take 1,000 mg by mouth 2 (two) times daily.    [provider]  COMBIVENT RESPIMAT 20-100 MCG/ACT AERS respimat Inhale 2 puffs into the lungs 4 (four) times daily as needed. 09/18/20   [provider]  CRANBERRY EXTRACT PO Take 500 mg by mouth every morning.     [provider]  Ensure Max Protein (ENSURE MAX PROTEIN) LIQD Take 330 mLs (11 oz total) by mouth 2 (two) times daily between meals. 12/29/19   Ezekiel Slocumb, DO  gabapentin (NEURONTIN) 100 MG capsule Take 100 mg by mouth 2 (two) times daily.    [provider]  glucose 4 GM chewable tablet Chew 1 tablet by mouth as needed for low blood sugar.    [provider]  hydrALAZINE (APRESOLINE) 50 MG tablet Take 1 tablet (50 mg total) by mouth every 8 (eight) hours. 10/15/19   Ghimire, Henreitta Leber, MD  insulin aspart (NOVOLOG) 100 UNIT/ML injection 0-9 Units, Subcutaneous, 3 times daily with meals CBG < 70: Implement Hypoglycemia protocol/measures CBG 70 - 120: 0 units CBG 121 - 150: 1  unit CBG 151 - 200: 2 units CBG 201 - 250: 3 units CBG 251 - 300: 5 units CBG 301 - 350: 7 units CBG 351 - 400: 9 units CBG > 400: call MD Patient taking differently: Inject 5 Units into the skin 3 (three) times daily with meals. 10/15/19   Ghimire, Henreitta Leber, MD  isosorbide mononitrate (IMDUR) 30 MG 24 hr tablet Take 1 tablet (30 mg total) by mouth daily. 10/15/19   Ghimire, Henreitta Leber, MD  lactase (LACTAID) 3000 units tablet Take 3,000 Units by mouth 3 (three) times daily between meals as needed.    [provider]  LANTUS SOLOSTAR 100 UNIT/ML Solostar Pen Inject 45 Units into the skin daily at 10 pm. 10/15/19   Ghimire, Henreitta Leber, MD  Magnesium 250 MG TABS Take 250 mg by mouth daily.    [provider]  Misc Natural Products (  TOTAL MEMORY & FOCUS FORMULA PO) Take 1 capsule by mouth See admin instructions. Take 2 capsule at 9:00 AM, 1 capsule at 5:00 PM, and 1 capsule at bedtime.    [provider]  Multiple Vitamin (MULTIVITAMIN WITH MINERALS) TABS tablet Take 1 tablet by mouth daily.    [provider]  NON FORMULARY Take 1 Dose by mouth 3 (three) times daily. Relief Factor OTC    [provider]  omeprazole (PRILOSEC) 20 MG capsule Take 20 mg by mouth daily.    [provider]  potassium chloride (K-DUR) 10 MEQ tablet Take 10 mEq by mouth daily.    [provider]  simvastatin (ZOCOR) 20 MG tablet Take 20 mg by mouth at bedtime.    [provider]  SUPER B COMPLEX/C PO Take 1 tablet by mouth every morning.    [provider]  torsemide (DEMADEX) 20 MG tablet Take 2 tablets (40 mg total) by mouth daily. 10/28/20 11/27/20  Sidney Ace, MD  traZODone (DESYREL) 50 MG tablet Take 50 mg by mouth at bedtime.    [provider]  vitamin C (ASCORBIC ACID) 500 MG tablet Take 500 mg by mouth 2 (two) times daily.     [provider]    Allergies Lactose intolerance (gi)  Family History   Problem Relation Age of Onset  . Lung cancer Mother   . Heart attack Father   . Hypertension Other   . Cancer Maternal Grandfather        prostate  . Cancer Paternal Grandmother        breast    Social History Social History   Tobacco Use  . Smoking status: Current Every Day Smoker    Packs/day: 1.00    Years: 54.00    Pack years: 54.00    Types: Cigarettes  . Smokeless tobacco: Never Used  Vaping Use  . Vaping Use: Never used  Substance Use Topics  . Alcohol use: No    Alcohol/week: 0.0 standard drinks  . Drug use: No    Review of Systems Constitutional: No fever/chills Eyes: No visual changes. ENT: No sore throat. Cardiovascular: Denies chest pain. Respiratory: Positive for dyspnea and orthopnea. Gastrointestinal: No abdominal pain.  No nausea, no vomiting.  No diarrhea.  No constipation. Genitourinary: Negative for dysuria. Musculoskeletal: Increased leg swelling.  Negative for neck pain.  Negative for back pain. Integumentary: Negative for rash. Neurological: Negative for headaches, focal weakness or numbness.   ____________________________________________   PHYSICAL EXAM:  VITAL SIGNS: ED Triage Vitals  Enc Vitals Group     BP 11/15/20 0150 (!) 158/43     Pulse Rate 11/15/20 0150 61     Resp 11/15/20 0150 (!) 24     Temp 11/15/20 0150 99 F (37.2 C)     Temp Source 11/15/20 0150 Oral     SpO2 11/15/20 0150 99 %     Weight 11/15/20 0152 74.8 kg (165 lb)     Height 11/15/20 0152 1.676 m (5\' 6" )     Head Circumference --      Peak Flow --      Pain Score 11/15/20 0152 0     Pain Loc --      Pain Edu? --      Excl. in [place]? --     Constitutional: Alert and oriented.  Appears chronically ill, in mild to moderate respiratory distress. Eyes: Conjunctivae are normal.  Head: Atraumatic. Nose: No congestion/rhinnorhea. Mouth/Throat: Patient is wearing  a mask. Neck: No stridor.  No meningeal signs.   Cardiovascular: Normal rate, irregular rhythm. Good  peripheral circulation. Respiratory: Increased respiratory effort with some retractions.  Coarse breath sounds throughout but no expiratory wheezing. Gastrointestinal: Soft and nontender. No distention.  Musculoskeletal: 1+ peripheral edema in bilateral lower extremities.. No gross deformities of extremities. Neurologic:  Normal speech and language. No gross focal neurologic deficits are appreciated.  Skin:  Skin is warm, dry and intact. Psychiatric: Mood and affect are normal. Speech and behavior are normal.  ____________________________________________   LABS (all labs ordered are listed, but only abnormal results are displayed)  Labs Reviewed  BRAIN NATRIURETIC PEPTIDE - Abnormal; Notable for the following components:      Result Value   B Natriuretic Peptide 994.6 (*)    All other components within normal limits  CBC WITH DIFFERENTIAL/PLATELET - Abnormal; Notable for the following components:   Hemoglobin 11.5 (*)    HCT 37.2 (*)    MCV 78.6 (*)    MCH 24.3 (*)    RDW 17.9 (*)    Neutro Abs 7.9 (*)    All other components within normal limits  COMPREHENSIVE METABOLIC PANEL - Abnormal; Notable for the following components:   Sodium 132 (*)    Chloride 97 (*)    Glucose, Bld 241 (*)    BUN 41 (*)    Creatinine, Ser 2.29 (*)    Calcium 8.5 (*)    Total Protein 6.3 (*)    Albumin 3.1 (*)    AST 14 (*)    GFR, Estimated 29 (*)    All other components within normal limits  TROPONIN I (HIGH SENSITIVITY) - Abnormal; Notable for the following components:   Troponin I (High Sensitivity) 41 (*)    All other components within normal limits  TROPONIN I (HIGH SENSITIVITY) - Abnormal; Notable for the following components:   Troponin I (High Sensitivity) 39 (*)    All other components within normal limits  RESP PANEL BY RT-PCR (FLU A&B, COVID) ARPGX2  LIPASE, BLOOD  TSH  POC SARS CORONAVIRUS 2 AG -  ED   ____________________________________________  EKG  ED ECG REPORT I,  Hinda Kehr, the attending physician, personally viewed and interpreted this ECG.  Date: 11/15/2020 EKG Time: 1:50 AM Rate: 66 Rhythm: Atrial fibrillation QRS Axis: normal Intervals: LVH with Abnormal intervals due to A. fib, otherwise unremarkable.  LVH. ST/T Wave abnormalities: Non-specific ST segment / T-wave changes, but no clear evidence of acute ischemia. Narrative Interpretation: no definitive evidence of acute ischemia; does not meet STEMI criteria.   ____________________________________________  RADIOLOGY I, Hinda Kehr, personally viewed and evaluated these images (plain radiographs) as part of my medical decision making, as well as reviewing the written report by the radiologist.  ED MD interpretation: Diffuse interstitial thickening without any obvious consolidation  Official radiology report(s): DG Chest Port 1 View  Result Date: 11/15/2020 CLINICAL DATA:  Acute dyspnea EXAM: PORTABLE CHEST 1 VIEW COMPARISON:  10/25/2020 FINDINGS: Diffuse interstitial thickening is unchanged. There is unchanged blunting of the left costophrenic angle. No new area of consolidation. Remote median sternotomy. Cardiomediastinal contours are normal. IMPRESSION: Unchanged diffuse interstitial thickening without new area of consolidation. Left costophrenic angle scarring versus small pleural effusion. Electronically Signed   By: Ulyses Jarred M.D.   On: 11/15/2020 02:13    ____________________________________________   PROCEDURES   Procedure(s) performed (including Critical Care):  .1-3 Lead EKG Interpretation Performed by: Hinda Kehr, MD Authorized by: Hinda Kehr, MD  Interpretation: abnormal     ECG rate:  60   ECG rate assessment: normal     Rhythm: atrial fibrillation     Ectopy: none     Conduction: normal    .Critical Care Performed by: Hinda Kehr, MD Authorized by: Hinda Kehr, MD   Critical care provider statement:    Critical care time (minutes):  45    Critical care time was exclusive of:  Separately billable procedures and treating other patients   Critical care was necessary to treat or prevent imminent or life-threatening deterioration of the following conditions:  Respiratory failure and cardiac failure (Bipap for CHF)   Critical care was time spent personally by me on the following activities:  Development of treatment plan with patient or surrogate, discussions with consultants, evaluation of patient's response to treatment, examination of patient, obtaining history from patient or surrogate, ordering and performing treatments and interventions, ordering and review of laboratory studies, ordering and review of radiographic studies, pulse oximetry, re-evaluation of patient's condition and review of old charts     ____________________________________________   [place] / MDM / [place] / ED COURSE  As part of my medical decision making, I reviewed the following data within the [place] notes reviewed and incorporated, Labs reviewed , EKG interpreted , Old chart reviewed, Radiograph reviewed , Discussed with admitting physician (Dr. Sidney Ace) and Notes from prior ED visits   Differential diagnosis includes, but is not limited to, CHF exacerbation, COPD exacerbation, ACS, PE, COVID-19.  The patient is on the cardiac monitor to evaluate for evidence of arrhythmia and/or significant heart rate changes.  Patient does not have any COVID-like symptoms other than acute dyspnea which is much more consistent with his CHF history.  He is not hypoxemic but he has increased work of breathing.  He feels a little bit better on 2 L of oxygen.  Labs are pending.  I personally reviewed the patient's imaging and agree with the radiologist's interpretation that there is no obvious interstitial edema or other emergent condition at this time, possible small pleural effusion.  I will hold off on positive pressure  ventilation at this time but we may need to readdress it if he is not feeling better on oxygen.  I will give a diuretic once we make sure his lab work/electrolytes are okay.  He agrees with the plan.  No sign of ischemia on EKG.  He has known atrial fibrillation and takes Eliquis.  Strongly doubt PE.     Clinical Course as of 11/15/20 0538  Thu Nov 15, 2020  0408 Reassessed the patient and he says he feels like he would feel better on BiPAP like he had last month.  I pointed out that based on the note, it looks like he was quite a bit sicker last month but he said he feels about the same as he does tonight.  Given his orthopnea, apparent increased volume, and respiratory distress without hypoxemia, I will go ahead and put him on BiPAP.  I discussed it with Christy with respiratory therapy and she will come down to start him.  I am still waiting the results of his comprehensive metabolic panel before I give him a dose of Lasix and then I will admit him. [CF]  [phone] Lab work is generally reassuring with an essentially stable creatinine, slight BUN elevation. Stable electrolytes. Patient is tolerating BiPAP well. Consulting the hospitalist for admission. I also ordered furosemide 40 mg IV to begin  the process of diuresis. [CF]  [phone] Discussed case by phone with Dr. Sidney Ace who will admit [CF]  0533 SARS Coronavirus 2 by RT PCR: NEGATIVE [CF]    Clinical Course User Index [CF] Hinda Kehr, MD     ____________________________________________  FINAL CLINICAL IMPRESSION(S) / ED DIAGNOSES  Final diagnoses:  Acute on chronic congestive heart failure, unspecified heart failure type ([place])  Acute dyspnea  Chronic kidney disease, unspecified CKD stage     MEDICATIONS GIVEN DURING THIS VISIT:  Medications  amLODipine (NORVASC) tablet 5 mg (has no administration in time range)  hydrALAZINE (APRESOLINE) tablet 50 mg (has no administration in time range)  isosorbide mononitrate (IMDUR) 24 hr tablet 30  mg (has no administration in time range)  simvastatin (ZOCOR) tablet 20 mg (has no administration in time range)  traZODone (DESYREL) tablet 50 mg (has no administration in time range)  insulin glargine (LANTUS) Solostar Pen 45 Units (has no administration in time range)  bifidobacterium infantis (ALIGN) capsule 1 capsule (has no administration in time range)  lactase (LACTAID) tablet 3,000 Units (has no administration in time range)  pantoprazole (PROTONIX) EC tablet 40 mg (has no administration in time range)  apixaban (ELIQUIS) tablet 5 mg (has no administration in time range)  gabapentin (NEURONTIN) capsule 100 mg (has no administration in time range)  cholecalciferol (VITAMIN D3) tablet 1,000 Units (has no administration in time range)  protein supplement (ENSURE MAX) liquid (has no administration in time range)  Magnesium TABS 250 mg (has no administration in time range)  multivitamin with minerals tablet 1 tablet (has no administration in time range)  potassium chloride (KLOR-CON) CR tablet 10 mEq (has no administration in time range)  ascorbic acid (VITAMIN C) tablet 500 mg (has no administration in time range)  albuterol (VENTOLIN HFA) 108 (90 Base) MCG/ACT inhaler 2 puff (has no administration in time range)  Ipratropium-Albuterol (COMBIVENT) respimat 2 puff (has no administration in time range)  enoxaparin (LOVENOX) injection 40 mg (has no administration in time range)  furosemide (LASIX) injection 40 mg (has no administration in time range)  acetaminophen (TYLENOL) tablet 650 mg (has no administration in time range)    Or  acetaminophen (TYLENOL) suppository 650 mg (has no administration in time range)  traZODone (DESYREL) tablet 25 mg (has no administration in time range)  magnesium hydroxide (MILK OF MAGNESIA) suspension 30 mL (has no administration in time range)  ondansetron (ZOFRAN) tablet 4 mg (has no administration in time range)    Or  ondansetron (ZOFRAN) injection 4 mg  (has no administration in time range)  furosemide (LASIX) injection 40 mg (40 mg Intravenous Given 11/15/20 0436)     ED Discharge Orders    None      *Please note:  [name] was evaluated in Emergency Department on 11/15/2020 for the symptoms described in the history of present illness. He was evaluated in the context of the global COVID-19 pandemic, which necessitated consideration that the patient might be at risk for infection with the SARS-CoV-2 virus that causes COVID-19. Institutional protocols and algorithms that pertain to the evaluation of patients at risk for COVID-19 are in a state of rapid change based on information released by regulatory bodies including the CDC and federal and state organizations. These policies and algorithms were followed during the patient's care in the ED.  Some ED evaluations and interventions may be delayed as a result of limited staffing during and after the pandemic.*  Note:  This document was prepared using Dragon  voice recognition software and may include unintentional dictation errors.   Hinda Kehr, MD 11/15/20 [phone]

## 2020-11-15 NOTE — H&P (Addendum)
[place]   PATIENT NAME: [name]    MR#:  [id number]  DATE OF BIRTH:  1946/06/05  DATE OF ADMISSION:  11/15/2020  PRIMARY CARE PHYSICIAN: [name], MD   REQUESTING/REFERRING PHYSICIAN: Hinda Kehr, MD CHIEF COMPLAINT:   Chief Complaint  Patient presents with  . Shortness of Breath    HISTORY OF PRESENT ILLNESS:  [name]  is a 75 y.o. Caucasian male with a known history of atrial fibrillation, coronary artery disease, type 2 diabetes mellitus, COPD, combined systolic and diastolic CHF, dyslipidemia, peripheral vascular disease, who presented to the ER with acute onset of dyspnea as well as dry cough and wheezing which have been going on over the last month but have significantly worsened since last night. He admitted to bilateral lower extremity edema as well as orthopnea.  No fever or chills.  No chest pain or palpitations.  No dysuria, oliguria or hematuria or flank pain.  He continues to smoke half a pack of cigarettes per day.    Upon presentation to the ER, blood pressure was 158/43, heart rate of 61 and respiratory to 24 with a pulse oximetry of 99% on 2 L of O2 by nasal cannula and was later placed on BiPAP due to respiratory distress.  Labs revealed mild hyponatremia and hypochloremia with a blood glucose of 241, BUN of 41 with a creatinine of 2.29 up from 53 and 1.99 on 10/27/2020.  Calcium was 8.5 with albumin of 3.1 with total protein of 6.3.  High-sensitivity troponin I was 41 and later 39 and BNP was 994.6.  CBC showed anemia.  Point-of-care COVID-19 rapid antigen test as well as PCR came back negative.  Chest x-ray showed unchanged diffuse interstitial thickening without new area of consolidation with left costophrenic angle scarring versus small pleural effusion. EKG showed atrial fibrillation with controlled ventricular sponsor of 66, LVH with anterior Q's and LVH.  The patient was given 40 mg of IV Lasix.  He will be admitted to a progressive unit bed  for further evaluation and management.  PAST MEDICAL HISTORY:   Past Medical History:  Diagnosis Date  . Arrhythmia    atrial fibrillation  . Atrial fibrillation ([place])   . Basal cell carcinoma 05/2019   right nasal ala, Tx: [facility]  . Basal cell carcinoma 04/12/2019   Left posterior ear. Nodular pattern, excoriated.   . CHF (congestive heart failure) ([place])   . COPD (chronic obstructive pulmonary disease) ([place])   . Coronary artery disease   . Diabetes mellitus without complication ([place])   . GERD (gastroesophageal reflux disease)   . HLD (hyperlipidemia)   . Hypertension   . Peripheral vascular disease ([place])   . Pneumonia   . Prostate cancer ([place])   . Squamous cell carcinoma of skin 08/10/2018   Left upper arm above elbow. [facility].  [facility] Squamous cell carcinoma of skin 09/07/2018   Right forearm, below elbow. [facility]. [facility] 01/10/2019.  Marland Kitchen Stroke [facility])     PAST SURGICAL HISTORY:   Past Surgical History:  Procedure Laterality Date  . COLONOSCOPY    . CORONARY ARTERY BYPASS GRAFT  2006  . EYE SURGERY    . GOLD SEED IMPLANT N/A 12/30/2016   Procedure: GOLD SEED IMPLANT  x3;  Surgeon: Hollice Espy, MD;  Location: [facility] ORS;  Service: Urology;  Laterality: N/A;  . HEMORRHOID SURGERY N/A 10/02/2016   Procedure: PROCTOSCOPY AND CONTROL OF RECTAL BLEEDING;  Surgeon: Excell Seltzer, MD;  Location: [facility];  Service: General;  Laterality: N/A;  . PROSTATE BIOPSY    . TONSILLECTOMY      SOCIAL HISTORY:   Social History   Tobacco Use  . Smoking status: Current Every Day Smoker    Packs/day: 1.00    Years: 54.00    Pack years: 54.00    Types: Cigarettes  . Smokeless tobacco: Never Used  Substance Use Topics  . Alcohol use: No    Alcohol/week: 0.0 standard drinks    FAMILY HISTORY:   Family History  Problem Relation Age of Onset  . Lung cancer Mother   . Heart attack Father   . Hypertension Other   . Cancer Maternal Grandfather        prostate  . Cancer Paternal Grandmother         breast    DRUG ALLERGIES:   Allergies  Allergen Reactions  . Lactose Intolerance (Gi)     REVIEW OF SYSTEMS:   ROS As per history of present illness. All pertinent systems were reviewed above. Constitutional, HEENT, cardiovascular, respiratory, GI, GU, musculoskeletal, neuro, psychiatric, endocrine, integumentary and hematologic systems were reviewed and are otherwise negative/unremarkable except for positive findings mentioned above in the HPI.   MEDICATIONS AT HOME:   Prior to Admission medications   Medication Sig Start Date End Date Taking? Authorizing Provider  acetaminophen (TYLENOL) 325 MG tablet Take 2 tablets (650 mg total) by mouth every 6 (six) hours as needed for mild pain (or Fever >/= 101). 01/20/16   Gouru, Illene Silver, MD  albuterol (PROVENTIL HFA;VENTOLIN HFA) 108 (90 Base) MCG/ACT inhaler Inhale 2 puffs into the lungs every 6 (six) hours as needed for wheezing or shortness of breath.    [provider]  amLODipine (NORVASC) 5 MG tablet Take 1 tablet (5 mg total) by mouth daily. 12/30/19   Ezekiel Slocumb, DO  apixaban (ELIQUIS) 5 MG TABS tablet Take 1 tablet (5 mg total) by mouth 2 (two) times daily. 03/12/20   Alisa Graff, FNP  Apoaequorin (PREVAGEN PO) Take by mouth.    [provider]  bifidobacterium infantis (ALIGN) capsule Take 1 capsule by mouth every morning.    [provider]  cholecalciferol (VITAMIN D) 1000 units tablet Take 1,000 Units by mouth daily.    [provider]  Cinnamon 500 MG capsule Take 1,000 mg by mouth 2 (two) times daily.    [provider]  COMBIVENT RESPIMAT 20-100 MCG/ACT AERS respimat Inhale 2 puffs into the lungs 4 (four) times daily as needed. 09/18/20   [provider]  CRANBERRY EXTRACT PO Take 500 mg by mouth every morning.     [provider]  Ensure Max Protein (ENSURE MAX PROTEIN) LIQD Take 330 mLs (11 oz total) by mouth 2 (two) times daily between meals. 12/29/19    Ezekiel Slocumb, DO  gabapentin (NEURONTIN) 100 MG capsule Take 100 mg by mouth 2 (two) times daily.    [provider]  glucose 4 GM chewable tablet Chew 1 tablet by mouth as needed for low blood sugar.    [provider]  hydrALAZINE (APRESOLINE) 50 MG tablet Take 1 tablet (50 mg total) by mouth every 8 (eight) hours. 10/15/19   Ghimire, Henreitta Leber, MD  insulin aspart (NOVOLOG) 100 UNIT/ML injection 0-9 Units, Subcutaneous, 3 times daily with meals CBG < 70: Implement Hypoglycemia protocol/measures CBG 70 - 120: 0 units CBG 121 - 150: 1 unit CBG 151 - 200: 2 units CBG 201 - 250:  3 units CBG 251 - 300: 5 units CBG 301 - 350: 7 units CBG 351 - 400: 9 units CBG > 400: call MD Patient taking differently: Inject 5 Units into the skin 3 (three) times daily with meals. 10/15/19   Ghimire, Henreitta Leber, MD  isosorbide mononitrate (IMDUR) 30 MG 24 hr tablet Take 1 tablet (30 mg total) by mouth daily. 10/15/19   Ghimire, Henreitta Leber, MD  lactase (LACTAID) 3000 units tablet Take 3,000 Units by mouth 3 (three) times daily between meals as needed.    [provider]  LANTUS SOLOSTAR 100 UNIT/ML Solostar Pen Inject 45 Units into the skin daily at 10 pm. 10/15/19   Ghimire, Henreitta Leber, MD  Magnesium 250 MG TABS Take 250 mg by mouth daily.    [provider]  Misc Natural Products (TOTAL MEMORY & FOCUS FORMULA PO) Take 1 capsule by mouth See admin instructions. Take 2 capsule at 9:00 AM, 1 capsule at 5:00 PM, and 1 capsule at bedtime.    [provider]  Multiple Vitamin (MULTIVITAMIN WITH MINERALS) TABS tablet Take 1 tablet by mouth daily.    [provider]  NON FORMULARY Take 1 Dose by mouth 3 (three) times daily. Relief Factor OTC    [provider]  omeprazole (PRILOSEC) 20 MG capsule Take 20 mg by mouth daily.    [provider]  potassium chloride (K-DUR) 10 MEQ tablet Take 10 mEq by mouth daily.    [provider]   simvastatin (ZOCOR) 20 MG tablet Take 20 mg by mouth at bedtime.    [provider]  SUPER B COMPLEX/C PO Take 1 tablet by mouth every morning.    [provider]  torsemide (DEMADEX) 20 MG tablet Take 2 tablets (40 mg total) by mouth daily. 10/28/20 11/27/20  Sidney Ace, MD  traZODone (DESYREL) 50 MG tablet Take 50 mg by mouth at bedtime.    [provider]  vitamin C (ASCORBIC ACID) 500 MG tablet Take 500 mg by mouth 2 (two) times daily.     [provider]      VITAL SIGNS:  Blood pressure 137/61, pulse 61, temperature 99 F (37.2 C), temperature source Oral, resp. rate (!) 22, height 5\' 6"  (1.676 m), weight 74.8 kg, SpO2 100 %.  PHYSICAL EXAMINATION:  Physical Exam  GENERAL:  75 y.o.-year-old Caucasian male patient lying in the bed with mild respiratory distress with conversational dyspnea. EYES: Pupils equal, round, reactive to light and accommodation. No scleral icterus. Extraocular muscles intact.  HEENT: Head atraumatic, normocephalic. Oropharynx and nasopharynx clear.  NECK:  Supple, no jugular venous distention. No thyroid enlargement, no tenderness.  LUNGS: Diminished bibasal breath sounds with bibasal rales. CARDIOVASCULAR: Regular rate and rhythm, S1, S2 normal. No murmurs, rubs, or gallops.  ABDOMEN: Soft, nondistended, nontender. Bowel sounds present. No organomegaly or mass.  EXTREMITIES: No pedal edema, cyanosis, or clubbing.  NEUROLOGIC: Cranial nerves II through XII are intact. Muscle strength 5/5 in all extremities. Sensation intact. Gait not checked.  PSYCHIATRIC: The patient is alert and oriented x 3.  Normal affect and good eye contact. SKIN: No obvious rash, lesion, or ulcer.   LABORATORY PANEL:   CBC Recent Labs  Lab 11/15/20 0150  WBC 10.1  HGB 11.5*  HCT 37.2*  PLT 262   ------------------------------------------------------------------------------------------------------------------  Chemistries  Recent  Labs  Lab 11/15/20 0314  NA 132*  K 4.2  CL 97*  CO2 25  GLUCOSE 241*  BUN 41*  CREATININE 2.29*  CALCIUM 8.5*  AST 14*  ALT 10  ALKPHOS 92  BILITOT 0.9   ------------------------------------------------------------------------------------------------------------------  Cardiac Enzymes No results for input(s): TROPONINI in the last 168 hours. ------------------------------------------------------------------------------------------------------------------  RADIOLOGY:  DG Chest [place] 1 View  Result Date: 11/15/2020 CLINICAL DATA:  Acute dyspnea EXAM: PORTABLE CHEST 1 VIEW COMPARISON:  10/25/2020 FINDINGS: Diffuse interstitial thickening is unchanged. There is unchanged blunting of the left costophrenic angle. No new area of consolidation. Remote median sternotomy. Cardiomediastinal contours are normal. IMPRESSION: Unchanged diffuse interstitial thickening without new area of consolidation. Left costophrenic angle scarring versus small pleural effusion. Electronically Signed   By: Ulyses Jarred M.D.   On: 11/15/2020 02:13      IMPRESSION AND PLAN:   1.  Acute on chronic combined systolic and diastolic CHF with subsequent acute respiratory failure with hypoxia requiring BiPAP. - The patient will be admitted to a progressive unit bed. - We will continue him on BiPAP for now. - The patient's most recent 2D echo was in February of last year and revealed an EF of 35 to 40% with grade 1 diastolic dysfunction, mild left atrial dilatation and right atrial dilatation and mild mitral valve regurgitation as well as mild aortic valve stenosis. - We will continue diuresis with IV Lasix. - We will follow serial troponin I's. - We will obtain a repeat 2D echo to follow-up on her EF and cardiology consult. - I notified Dr. Clayborn Bigness about the patient.  2.  Essential hypertension. - We will continue Norvasc and hydralazine.  3.  Type 2 diabetes mellitus with peripheral neuropathy. - The  patient will be placed on supplement coverage with NovoLog. - We will continue Neurontin.  4.  Coronary artery disease. - We will continue statin therapy as well as Imdur.  5.  Chronic atrial fibrillation with controlled ventricular response. -We will continue Eliquis.  6.  Ongoing tobacco abuse. - The patient was counseled for smoking cessation and will receive further counseling here.  7.  DVT prophylaxis. - We will continue Eliquis.   All the records are reviewed and case discussed with ED provider. The plan of care was discussed in details with the patient (and family). I answered all questions. The patient agreed to proceed with the above mentioned plan. Further management will depend upon hospital course.   CODE STATUS: Full code  Status is: Inpatient  Remains inpatient appropriate because:Ongoing diagnostic testing needed not appropriate for outpatient work up, Unsafe d/c plan, IV treatments appropriate due to intensity of illness or inability to take PO and Inpatient level of care appropriate due to severity of illness   Dispo: The patient is from: Home              Anticipated d/c is to: Home              Anticipated d/c date is: 2 days              Patient currently is not medically stable to d/c.   TOTAL TIME TAKING CARE OF THIS PATIENT: 55 minutes.    Christel Mormon M.D on 11/15/2020 at 5:38 AM  [facility]   From 7 PM-7 AM, contact night-coverage [email or web address]  CC: Primary care physician; [name], MD

## 2020-11-15 NOTE — Progress Notes (Signed)
PROGRESS NOTE    [name]  WSF:[id number] DOB: Dec 14, 1945 DOA: 11/15/2020 PCP: [name], MD    Brief Narrative:  75 y.o. Caucasian male with a known history of atrial fibrillation, coronary artery disease, type 2 diabetes mellitus, COPD, combined systolic and diastolic CHF, dyslipidemia, peripheral vascular disease, who presented to the ER with acute onset of dyspnea as well as dry cough and wheezing which have been going on over the last month but have significantly worsened since last night. He admitted to bilateral lower extremity edema as well as orthopnea.  No fever or chills.  No chest pain or palpitations.  No dysuria, oliguria or hematuria or flank pain.  He continues to smoke half a pack of cigarettes per day.   Assessment & Plan:   Active Problems:   Acute CHF (congestive heart failure) ([place])   1.  Acute on chronic combined systolic and diastolic CHF with subsequent acute respiratory failure with hypoxia requiring BiPAP. - The patient will be admitted to a progressive unit bed. - Pt was continued on BiPAP for now. - The patient's most recent 2D echo was in February of last year and revealed an EF of 35 to 40% with grade 1 diastolic dysfunction, mild left atrial dilatation and right atrial dilatation and mild mitral valve regurgitation as well as mild aortic valve stenosis. - Continued with scheduled with IV Lasix. - Repeat 2D echo with EF 50-55% - Cardiology was consulted at time of presentation  2.  Essential hypertension. - We will continue Norvasc and hydralazine as tolerated  3.  Type 2 diabetes mellitus with peripheral neuropathy. - The patient will be placed on supplement coverage with NovoLog. -Continued on Neurontin.  4.  Coronary artery disease. - Continued on statin therapy as well as Imdur.  5.  Chronic atrial fibrillation with controlled ventricular response. -Continued on Eliquis.  6.  Ongoing tobacco abuse. - Cessation done at bedside -Pt  declines nicotine patch  7.  DVT prophylaxis. - We will continue Eliquis.  DVT prophylaxis: Eliquis Code Status: Full Family Communication: Pt in room, family not at bedside  Status is: Inpatient  Remains inpatient appropriate because:IV treatments appropriate due to intensity of illness or inability to take PO and Inpatient level of care appropriate due to severity of illness   Dispo: The patient is from: Home              Anticipated d/c is to: Home              Anticipated d/c date is: 2 days              Patient currently is not medically stable to d/c.       Consultants:     Procedures:     Antimicrobials: Anti-infectives (From admission, onward)   None       Subjective: Seen while pt is still on bipap. States feeling somewhat better on bipap  Objective: Vitals:   11/15/20 0830 11/15/20 0900 11/15/20 0958 11/15/20 1322  BP: (!) 144/43 (!) 147/59 (!) 169/52 (!) 170/58  Pulse: (!) 48 (!) 50  (!) 59  Resp:  (!) 22  18  Temp:      TempSrc:      SpO2: 99% 100%  97%  Weight:      Height:       No intake or output data in the 24 hours ending 11/15/20 1630 Filed Weights   11/15/20 0152  Weight: 74.8 kg    Examination:  General exam: Appears calm and comfortable  Respiratory system: Clear to auscultation. Respiratory effort normal. On bipap Cardiovascular system: S1 & S2 heard, Regular Gastrointestinal system: Abdomen is nondistended, soft and nontender. No organomegaly or masses felt. Normal bowel sounds heard. Central nervous system: Alert and oriented. No focal neurological deficits. Extremities: Symmetric 5 x 5 power. Skin: No rashes, lesions Psychiatry: Judgement and insight appear normal. Mood & affect appropriate.   Data Reviewed: I have personally reviewed following labs and imaging studies  CBC: Recent Labs  Lab 11/15/20 0150  WBC 10.1  NEUTROABS 7.9*  HGB 11.5*  HCT 37.2*  MCV 78.6*  PLT 536   Basic Metabolic Panel: Recent  Labs  Lab 11/15/20 0314  NA 132*  K 4.2  CL 97*  CO2 25  GLUCOSE 241*  BUN 41*  CREATININE 2.29*  CALCIUM 8.5*   GFR: Estimated Creatinine Clearance: 25.2 mL/min (A) (by C-G formula based on SCr of 2.29 mg/dL (H)). Liver Function Tests: Recent Labs  Lab 11/15/20 0314  AST 14*  ALT 10  ALKPHOS 92  BILITOT 0.9  PROT 6.3*  ALBUMIN 3.1*   Recent Labs  Lab 11/15/20 0314  LIPASE 20   No results for input(s): AMMONIA in the last 168 hours. Coagulation Profile: No results for input(s): INR, PROTIME in the last 168 hours. Cardiac Enzymes: No results for input(s): CKTOTAL, CKMB, CKMBINDEX, TROPONINI in the last 168 hours. BNP (last 3 results) No results for input(s): PROBNP in the last 8760 hours. HbA1C: No results for input(s): HGBA1C in the last 72 hours. CBG: No results for input(s): GLUCAP in the last 168 hours. Lipid Profile: No results for input(s): CHOL, HDL, LDLCALC, TRIG, CHOLHDL, LDLDIRECT in the last 72 hours. Thyroid Function Tests: Recent Labs    11/15/20 0314  TSH 5.106*   Anemia Panel: No results for input(s): VITAMINB12, FOLATE, FERRITIN, TIBC, IRON, RETICCTPCT in the last 72 hours. Sepsis Labs: No results for input(s): PROCALCITON, LATICACIDVEN in the last 168 hours.  Recent Results (from the past 240 hour(s))  Resp Panel by RT-PCR (Flu A&B, Covid) Nasopharyngeal Swab     Status: None   Collection Time: 11/15/20  3:14 AM   Specimen: Nasopharyngeal Swab; Nasopharyngeal(NP) swabs in vial transport medium  Result Value Ref Range Status   SARS Coronavirus 2 by RT PCR NEGATIVE NEGATIVE Final    Comment: (NOTE) SARS-CoV-2 target nucleic acids are NOT DETECTED.  The SARS-CoV-2 RNA is generally detectable in upper respiratory specimens during the acute phase of infection. The lowest concentration of SARS-CoV-2 viral copies this assay can detect is 138 copies/mL. A negative result does not preclude SARS-Cov-2 infection and should not be used as the  sole basis for treatment or other patient management decisions. A negative result may occur with  improper specimen collection/handling, submission of specimen other than nasopharyngeal swab, presence of viral mutation(s) within the areas targeted by this assay, and inadequate number of viral copies(<138 copies/mL). A negative result must be combined with clinical observations, patient history, and epidemiological information. The expected result is Negative.  Fact Sheet for Patients:  EntrepreneurPulse.com.au  Fact Sheet for Healthcare Providers:  IncredibleEmployment.be  This test is no t yet approved or cleared by the [place] FDA and  has been authorized for detection and/or diagnosis of SARS-CoV-2 by FDA under an Emergency Use Authorization (EUA). This EUA will remain  in effect (meaning this test can be used) for the duration of the COVID-19 declaration under Section 564(b)(1) of the Act, 21 U.S.C.section  360bbb-3(b)(1), unless the authorization is terminated  or revoked sooner.       Influenza A by PCR NEGATIVE NEGATIVE Final   Influenza B by PCR NEGATIVE NEGATIVE Final    Comment: (NOTE) The Xpert Xpress SARS-CoV-2/FLU/RSV plus assay is intended as an aid in the diagnosis of influenza from Nasopharyngeal swab specimens and should not be used as a sole basis for treatment. Nasal washings and aspirates are unacceptable for Xpert Xpress SARS-CoV-2/FLU/RSV testing.  Fact Sheet for Patients: EntrepreneurPulse.com.au  Fact Sheet for Healthcare Providers: IncredibleEmployment.be  This test is not yet approved or cleared by the [place] FDA and has been authorized for detection and/or diagnosis of SARS-CoV-2 by FDA under an Emergency Use Authorization (EUA). This EUA will remain in effect (meaning this test can be used) for the duration of the COVID-19 declaration under Section 564(b)(1) of the  Act, 21 U.S.C. section 360bbb-3(b)(1), unless the authorization is terminated or revoked.  Performed at [facility], [address] [place], [place] [address]      Radiology Studies: DG Chest [place] 1 View  Result Date: 11/15/2020 CLINICAL DATA:  Acute dyspnea EXAM: PORTABLE CHEST 1 VIEW COMPARISON:  10/25/2020 FINDINGS: Diffuse interstitial thickening is unchanged. There is unchanged blunting of the left costophrenic angle. No new area of consolidation. Remote median sternotomy. Cardiomediastinal contours are normal. IMPRESSION: Unchanged diffuse interstitial thickening without new area of consolidation. Left costophrenic angle scarring versus small pleural effusion. Electronically Signed   By: Ulyses Jarred M.D.   On: 11/15/2020 02:13    Scheduled Meds: . acidophilus  1 capsule Oral q morning - 10a  . amLODipine  5 mg Oral Daily  . apixaban  5 mg Oral BID  . vitamin C  500 mg Oral BID  . cholecalciferol  1,000 Units Oral Daily  . furosemide  40 mg Intravenous Q12H  . gabapentin  100 mg Oral BID  . hydrALAZINE  50 mg Oral Q8H  . insulin glargine  45 Units Subcutaneous Q2200  . isosorbide mononitrate  30 mg Oral Daily  . lactase  3,000 Units Oral TID WC  . magnesium oxide  400 mg Oral Daily  . multivitamin with minerals  1 tablet Oral Daily  . pantoprazole  40 mg Oral Daily  . potassium chloride  10 mEq Oral Daily  . Ensure Max Protein  11 oz Oral BID BM  . simvastatin  20 mg Oral QHS  . traZODone  50 mg Oral QHS   Continuous Infusions:   LOS: 0 days   Marylu Lund, MD [facility] Pager On Amion  If 7PM-7AM, please contact night-coverage 11/15/2020, 4:30 PM

## 2020-11-15 NOTE — Consult Note (Signed)
   Heart Failure Nurse Navigator Note  HFrEF 35-40%.  Grade I Diastolic dysfunction. Mild bi-atrial enlargement. Mild mitral regurgitation.  Mild aortic stenosis.  Presented with complaints of dyspnea, dry cough, wheezing, orthopnea and bilateral lower extremity edema.  Comorbidities:  Atrial fibrillation Coronary artery disease Diabetes Hyperlipidemia Hypertension COPD GERD     Continued tobacco abuse.  He had last been hospitalized in December from the 22nd to the 26 of 2021  Medications:  Amlodipine 5 mg daily Eliquis 5 mg twice daily Furosemide 40 mg IV every 12 Hydralazine 50 mg every 8 hours Isosorbide mononitrate 30 mg daily Simvastatin 20 mg at at bedtime  Assessment:  General-he is awake and alert, lying on a gurney in the emergency room.  Currently on nasal cannula, trying to eat his lunch with some mild respiratory distress.  HEENT-normocephalic.  Pupils equal, nonicteric.  Cardiac-heart tones are regular.  2/6 systolic murmur.  Chest- breath sounds are diminished in the bases, no wheezes or rhonchi noted.  Abdomen-rounded soft non tender.  Lower extremities- 2+ pitting edema.  Psych-is pleasant and appropriate.  Neurologic-speech is clear, moves all extremities without difficulty.   Was seen in the ED, asked him if there is anything that changed from when he saw Otila Kluver in the outpatient heart failure clinic of 1 week ago, at that time she felt that he was euvolemic.  He states that they had been eating convenience frozen dinners at least every other night or every third night.  Discussed following a 2000 mg sodium restriction diet, he states he was unaware of a restriction on sodium.   Continues to smoke.  Will continue to follow, continue HF instruction.  Pricilla Riffle RN, CHFN

## 2020-11-15 NOTE — ED Notes (Signed)
Pt assisted to bedside commode. 1 assist. Linens changed

## 2020-11-15 NOTE — ED Notes (Signed)
Pt switched to 4L to eat breakfast and take morning medications. Admitting MD Wyline Copas aware.

## 2020-11-15 NOTE — ED Notes (Signed)
External male suction device placed on pt at this time

## 2020-11-15 NOTE — ED Notes (Signed)
Pt placed back on bi-pap per patient request

## 2020-11-15 NOTE — Progress Notes (Signed)
*  PRELIMINARY RESULTS* Echocardiogram 2D Echocardiogram has been performed.  [name] [name] 11/15/2020, 1:01 PM

## 2020-11-15 NOTE — ED Triage Notes (Signed)
Pt to ED via [facility] from home. Pt started to have SOB around 2000 and could not lay down completley without feeling SOB. Pt was 95% on room air. 99% on 2L. Pt has hx of COPD and CHF.   BP: 185/51 CBG 247

## 2020-11-16 DIAGNOSIS — R0600 Dyspnea, unspecified: Secondary | ICD-10-CM | POA: Diagnosis not present

## 2020-11-16 DIAGNOSIS — I509 Heart failure, unspecified: Secondary | ICD-10-CM | POA: Diagnosis not present

## 2020-11-16 LAB — BASIC METABOLIC PANEL
Anion gap: 10 (ref 5–15)
BUN: 35 mg/dL — ABNORMAL HIGH (ref 8–23)
CO2: 29 mmol/L (ref 22–32)
Calcium: 9 mg/dL (ref 8.9–10.3)
Chloride: 99 mmol/L (ref 98–111)
Creatinine, Ser: 1.8 mg/dL — ABNORMAL HIGH (ref 0.61–1.24)
GFR, Estimated: 39 mL/min — ABNORMAL LOW (ref >60)
Glucose, Bld: 175 mg/dL — ABNORMAL HIGH (ref 70–99)
Potassium: 4.1 mmol/L (ref 3.5–5.1)
Sodium: 138 mmol/L (ref 135–145)

## 2020-11-16 LAB — CBC
HCT: 38.3 % — ABNORMAL LOW (ref 39.0–52.0)
Hemoglobin: 11.9 g/dL — ABNORMAL LOW (ref 13.0–17.0)
MCH: 24.4 pg — ABNORMAL LOW (ref 26.0–34.0)
MCHC: 31.1 g/dL (ref 30.0–36.0)
MCV: 78.6 fL — ABNORMAL LOW (ref 80.0–100.0)
Platelets: 282 10*3/uL (ref 150–400)
RBC: 4.87 MIL/uL (ref 4.22–5.81)
RDW: 17.7 % — ABNORMAL HIGH (ref 11.5–15.5)
WBC: 8.1 10*3/uL (ref 4.0–10.5)
nRBC: 0 % (ref 0.0–0.2)

## 2020-11-16 LAB — CBG MONITORING, ED: Glucose-Capillary: 155 mg/dL — ABNORMAL HIGH (ref 70–99)

## 2020-11-16 LAB — T4, FREE: Free T4: 1.1 ng/dL (ref 0.61–1.12)

## 2020-11-16 LAB — BRAIN NATRIURETIC PEPTIDE: B Natriuretic Peptide: 1245.8 pg/mL — ABNORMAL HIGH (ref 0.0–100.0)

## 2020-11-16 NOTE — Progress Notes (Addendum)
Central telle called and stated patient had a 11 beat run of Vtach. Went to check on patient. Patient awake watching tv. Patient not in any distress.   Update 11/16/2020: NP Randol Kern was notified

## 2020-11-16 NOTE — Progress Notes (Signed)
    Heart failure nurse navigator note  HFrEF 35-40%. Grade 1 diastolic dysfunction. Mild biatrial enlargement. Mild mitral regurgitation, mild aortic stenosis.  Presented with complaints of dyspnea, dry cough, wheezing, orthopnea and bilateral lower extremity pitting edema.  Comorbidities:  Atrial fibrillation Coronary artery disease/coronary artery bypass grafting Diabetes Hyperlipidemia Hypertension COPD GERD    Continued tobacco abuse. His last hospitalization for heart failure was December from the 22nd to the 26th and 2021.   Medications:  Amlodipine 5 mg daily Eliquis 5 mg twice a day Furosemide 40 mg IV every 12 hours Hydralazine 50 mg every 8 hours Isosorbide mononitrate 30 mg daily Potassium chloride 10 mEq daily Zocor 20 mg at at bedtime.  Labs:  NP 1045, sodium 138, potassium 4.1, chloride 99, CO2 28, BUN 35, creatinine 1.8, Intake 100 mL Output 700 mL Weight 74.8 kg BMI 26.63 Blood pressure 124/62  Assessment:  General-he is awake and alert lying in bed wife is present at the bedside.  HEENT-normocephalic-pupils are equal.  Cardiac-heart tones are irregular  Chest- faint inspiratory wheeze no rales or rhonchi noted.  Abdomen soft nontender  Musculoskeletal-moves all extremities without difficulty, skin is more wrinkled today on the lower extremity.  Neuro-speech is clear  Psych-is pleasant and appropriate makes good eye contact    Met today with patient along with the wife discussed steps that would help to keep him out of the hospital. He does weigh himself daily but does not record, instructed that it would be easier to keep track of it if you wrote it down and he could see if he had a 2 pound weight gain overnight or 5 pounds within the week.  Also discussed his dietary indiscretions. The wife states that he only eats 2 meals a day but he does eat  2 sausage biscuits along with a package of instant oatmeal for breakfast and then for  supper time will eat convenience frozen dinners, she states that he really likes the [place] steak once. They do not read labels, instructed that they will have to start reading labels and keep track of what he is taking in in a days time, his allotment is less than 2000 mg in a 24-hour. And reviewed the importance of sticking within his dietary allotment of 2000 mg of sodium daily to help with keeping him out of the hospital   [place], [facility]

## 2020-11-16 NOTE — Progress Notes (Signed)
PROGRESS NOTE    [name]  AYT:[id number] DOB: 1946-01-02 DOA: 11/15/2020 PCP: [name], MD    Brief Narrative:  75 y.o. Caucasian male with a known history of atrial fibrillation, coronary artery disease, type 2 diabetes mellitus, COPD, combined systolic and diastolic CHF, dyslipidemia, peripheral vascular disease, who presented to the ER with acute onset of dyspnea as well as dry cough and wheezing which have been going on over the last month but have significantly worsened since last night. He admitted to bilateral lower extremity edema as well as orthopnea.  No fever or chills.  No chest pain or palpitations.  No dysuria, oliguria or hematuria or flank pain.  He continues to smoke half a pack of cigarettes per day.   Assessment & Plan:   Active Problems:   Acute CHF (congestive heart failure) ([place])   1.  Acute on chronic combined systolic and diastolic CHF with subsequent acute respiratory failure with hypoxia requiring BiPAP. - The patient will be admitted to a progressive unit bed. - Pt was continued on BiPAP for now. - The patient's most recent 2D echo was in February of last year and revealed an EF of 35 to 40% with grade 1 diastolic dysfunction, mild left atrial dilatation and right atrial dilatation and mild mitral valve regurgitation as well as mild aortic valve stenosis. - Still complains of orthopnea and not yet at baseline. Continued with scheduled with IV Lasix. - Repeat 2D echo with EF 50-55% -Tolerating diuresis  2.  Essential hypertension. - We will continue Norvasc and hydralazine as tolerated  3.  Type 2 diabetes mellitus with peripheral neuropathy. - The patient will be placed on supplement coverage with NovoLog. -Continued on Neurontin as tolerated  4.  Coronary artery disease. - Continued on statin therapy as well as Imdur.  5.  Chronic atrial fibrillation with controlled ventricular response. -Continued on Eliquis. -No evidence of acute blood  loss  6.  Ongoing tobacco abuse. - Cessation done at bedside at presentation - Pt had declined nicotine patch  7.  DVT prophylaxis. - We will continue Eliquis per above  DVT prophylaxis: Eliquis Code Status: Full Family Communication: Pt in room, family not at bedside  Status is: Inpatient  Remains inpatient appropriate because:IV treatments appropriate due to intensity of illness or inability to take PO and Inpatient level of care appropriate due to severity of illness   Dispo: The patient is from: Home              Anticipated d/c is to: Home              Anticipated d/c date is: 2 days              Patient currently is not medically stable to d/c.  Consultants:     Procedures:     Antimicrobials: Anti-infectives (From admission, onward)   None      Subjective: Reports breathing better, still requiring supplemental O2. States he is normally O2 naive  Objective: Vitals:   11/16/20 0730 11/16/20 0824 11/16/20 1159 11/16/20 1517  BP:  136/78 124/62 (!) 133/92  Pulse: 72 76 80 (!) 51  Resp:  15 15 15   Temp:  98.9 F (37.2 C) 99.2 F (37.3 C) 98.2 F (36.8 C)  TempSrc:  Oral Oral Oral  SpO2: 97% (!) 89% 96% 96%  Weight:      Height:        Intake/Output Summary (Last 24 hours) at 11/16/2020 1638 Last data filed  at 11/16/2020 0156 Gross per 24 hour  Intake --  Output 700 ml  Net -700 ml   Filed Weights   11/15/20 0152  Weight: 74.8 kg    Examination: General exam: Awake, laying in bed, in nad Respiratory system: slightly increased resp effort, crackles auscultated  Cardiovascular system: regular rate, s1, s2 Gastrointestinal system: Soft, nondistended, positive BS Central nervous system: CN2-12 grossly intact, strength intact Extremities: Perfused, no clubbing Skin: Normal skin turgor, no notable skin lesions seen Psychiatry: Mood normal // no visual hallucinations   Data Reviewed: I have personally reviewed following labs and imaging  studies  CBC: Recent Labs  Lab 11/15/20 0150 11/16/20 0900  WBC 10.1 8.1  NEUTROABS 7.9*  --   HGB 11.5* 11.9*  HCT 37.2* 38.3*  MCV 78.6* 78.6*  PLT 262 811   Basic Metabolic Panel: Recent Labs  Lab 11/15/20 0314 11/16/20 0537  NA 132* 138  K 4.2 4.1  CL 97* 99  CO2 25 29  GLUCOSE 241* 175*  BUN 41* 35*  CREATININE 2.29* 1.80*  CALCIUM 8.5* 9.0   GFR: Estimated Creatinine Clearance: 32 mL/min (A) (by C-G formula based on SCr of 1.8 mg/dL (H)). Liver Function Tests: Recent Labs  Lab 11/15/20 0314  AST 14*  ALT 10  ALKPHOS 92  BILITOT 0.9  PROT 6.3*  ALBUMIN 3.1*   Recent Labs  Lab 11/15/20 0314  LIPASE 20   No results for input(s): AMMONIA in the last 168 hours. Coagulation Profile: No results for input(s): INR, PROTIME in the last 168 hours. Cardiac Enzymes: No results for input(s): CKTOTAL, CKMB, CKMBINDEX, TROPONINI in the last 168 hours. BNP (last 3 results) No results for input(s): PROBNP in the last 8760 hours. HbA1C: No results for input(s): HGBA1C in the last 72 hours. CBG: Recent Labs  Lab 11/16/20 0617  GLUCAP 155*   Lipid Profile: No results for input(s): CHOL, HDL, LDLCALC, TRIG, CHOLHDL, LDLDIRECT in the last 72 hours. Thyroid Function Tests: Recent Labs    11/15/20 0314 11/16/20 0537  TSH 5.106*  --   FREET4  --  1.10   Anemia Panel: No results for input(s): VITAMINB12, FOLATE, FERRITIN, TIBC, IRON, RETICCTPCT in the last 72 hours. Sepsis Labs: No results for input(s): PROCALCITON, LATICACIDVEN in the last 168 hours.  Recent Results (from the past 240 hour(s))  Resp Panel by RT-PCR (Flu A&B, Covid) Nasopharyngeal Swab     Status: None   Collection Time: 11/15/20  3:14 AM   Specimen: Nasopharyngeal Swab; Nasopharyngeal(NP) swabs in vial transport medium  Result Value Ref Range Status   SARS Coronavirus 2 by RT PCR NEGATIVE NEGATIVE Final    Comment: (NOTE) SARS-CoV-2 target nucleic acids are NOT DETECTED.  The  SARS-CoV-2 RNA is generally detectable in upper respiratory specimens during the acute phase of infection. The lowest concentration of SARS-CoV-2 viral copies this assay can detect is 138 copies/mL. A negative result does not preclude SARS-Cov-2 infection and should not be used as the sole basis for treatment or other patient management decisions. A negative result may occur with  improper specimen collection/handling, submission of specimen other than nasopharyngeal swab, presence of viral mutation(s) within the areas targeted by this assay, and inadequate number of viral copies(<138 copies/mL). A negative result must be combined with clinical observations, patient history, and epidemiological information. The expected result is Negative.  Fact Sheet for Patients:  EntrepreneurPulse.com.au  Fact Sheet for Healthcare Providers:  IncredibleEmployment.be  This test is no t yet approved or cleared  by the [place] and  has been authorized for detection and/or diagnosis of SARS-CoV-2 by FDA under an Emergency Use Authorization (EUA). This EUA will remain  in effect (meaning this test can be used) for the duration of the COVID-19 declaration under Section 564(b)(1) of the Act, 21 U.S.C.section 360bbb-3(b)(1), unless the authorization is terminated  or revoked sooner.       Influenza A by PCR NEGATIVE NEGATIVE Final   Influenza B by PCR NEGATIVE NEGATIVE Final    Comment: (NOTE) The Xpert Xpress SARS-CoV-2/FLU/RSV plus assay is intended as an aid in the diagnosis of influenza from Nasopharyngeal swab specimens and should not be used as a sole basis for treatment. Nasal washings and aspirates are unacceptable for Xpert Xpress SARS-CoV-2/FLU/RSV testing.  Fact Sheet for Patients: EntrepreneurPulse.com.au  Fact Sheet for Healthcare Providers: IncredibleEmployment.be  This test is not yet approved or  cleared by the [place] FDA and has been authorized for detection and/or diagnosis of SARS-CoV-2 by FDA under an Emergency Use Authorization (EUA). This EUA will remain in effect (meaning this test can be used) for the duration of the COVID-19 declaration under Section 564(b)(1) of the Act, 21 U.S.C. section 360bbb-3(b)(1), unless the authorization is terminated or revoked.  Performed at [facility], [address] [place], [place] [address]      Radiology Studies: DG Chest [place] 1 View  Result Date: 11/15/2020 CLINICAL DATA:  Acute dyspnea EXAM: PORTABLE CHEST 1 VIEW COMPARISON:  10/25/2020 FINDINGS: Diffuse interstitial thickening is unchanged. There is unchanged blunting of the left costophrenic angle. No new area of consolidation. Remote median sternotomy. Cardiomediastinal contours are normal. IMPRESSION: Unchanged diffuse interstitial thickening without new area of consolidation. Left costophrenic angle scarring versus small pleural effusion. Electronically Signed   By: Ulyses Jarred M.D.   On: 11/15/2020 02:13   ECHOCARDIOGRAM COMPLETE  Result Date: 11/15/2020    ECHOCARDIOGRAM REPORT   Patient Name:   Huriel Monreal Date of Exam: 11/15/2020 Medical Rec #:  [id number]  Height:       66.0 in Accession #:    [id number] Weight:       165.0 lb Date of Birth:  1946/01/21  BSA:          1.843 m Patient Age:    77 years   BP:           169/52 mmHg Patient Gender: M          HR:           55 bpm. Exam Location:  [facility] Procedure: 2D Echo, Color Doppler, Cardiac Doppler and Intracardiac            Opacification Agent Indications:     I50.31 CHF-Acute Diastolic  History:         Patient has prior history of Echocardiogram examinations, most                  recent 12/26/2019. Stroke, PVD and COPD; Risk                  Factors:Hypertension, Diabetes and Dyslipidemia.  Sonographer:     Charmayne Sheer RDCS (AE) Referring Phys:  [id number] Arvella Merles [place] Diagnosing Phys: Bartholome Bill MD  Sonographer  Comments: Suboptimal apical window and suboptimal subcostal window. Image acquisition challenging due to COPD. IMPRESSIONS  1. Left ventricular ejection fraction, by estimation, is 50 to 55%. The left ventricle has low normal function. The left ventricle has no regional wall motion abnormalities. Left  ventricular diastolic parameters were normal.  2. Right ventricular systolic function is normal. The right ventricular size is mildly enlarged.  3. The mitral valve was not well visualized. Mild mitral valve regurgitation.  4. The aortic valve is calcified. Aortic valve regurgitation is trivial.  5. Aortic dilatation noted. FINDINGS  Left Ventricle: Left ventricular ejection fraction, by estimation, is 50 to 55%. The left ventricle has low normal function. The left ventricle has no regional wall motion abnormalities. Definity contrast agent was given IV to delineate the left ventricular endocardial borders. The left ventricular internal cavity size was normal in size. There is borderline left ventricular hypertrophy. Left ventricular diastolic parameters were normal. Right Ventricle: The right ventricular size is mildly enlarged. No increase in right ventricular wall thickness. Right ventricular systolic function is normal. Left Atrium: Left atrial size was normal in size. Right Atrium: Right atrial size was normal in size. Pericardium: There is no evidence of pericardial effusion. Mitral Valve: The mitral valve was not well visualized. Mild mitral valve regurgitation. MV peak gradient, 2.5 mmHg. The mean mitral valve gradient is 1.0 mmHg. Tricuspid Valve: The tricuspid valve is not well visualized. Tricuspid valve regurgitation is trivial. Aortic Valve: The aortic valve is calcified. Aortic valve regurgitation is trivial. Aortic valve mean gradient measures 7.0 mmHg. Aortic valve peak gradient measures 18.0 mmHg. Aortic valve area, by VTI measures 2.22 cm. Pulmonic Valve: The pulmonic valve was not well visualized.  Pulmonic valve regurgitation is trivial. Aorta: Aortic dilatation noted. IAS/Shunts: The interatrial septum was not well visualized.  LEFT VENTRICLE PLAX 2D LVIDd:         5.40 cm  Diastology LVIDs:         3.90 cm  LV e' medial:    2.94 cm/s LV PW:         1.30 cm  LV E/e' medial:  46.0 LV IVS:        1.20 cm  LV e' lateral:   9.57 cm/s LVOT diam:     2.30 cm  LV E/e' lateral: 14.1 LV SV:         88 LV SV Index:   48 LVOT Area:     4.15 cm  LEFT ATRIUM             Index LA diam:        5.00 cm 2.71 cm/m LA Vol (A2C):   41.4 ml 22.47 ml/m LA Vol (A4C):   40.6 ml 22.03 ml/m LA Biplane Vol: 42.0 ml 22.79 ml/m  AORTIC VALVE                    PULMONIC VALVE AV Area (Vmax):    1.59 cm     PV Vmax:       1.19 m/s AV Area (Vmean):   1.89 cm     PV Vmean:      72.200 cm/s AV Area (VTI):     2.22 cm     PV VTI:        0.181 m AV Vmax:           212.00 cm/s  PV Peak grad:  5.7 mmHg AV Vmean:          122.000 cm/s PV Mean grad:  2.0 mmHg AV VTI:            0.396 m AV Peak Grad:      18.0 mmHg AV Mean Grad:      7.0 mmHg LVOT Vmax:  81.10 cm/s LVOT Vmean:        55.500 cm/s LVOT VTI:          0.212 m LVOT/AV VTI ratio: 0.54  AORTA Ao Root diam: 3.60 cm MITRAL VALVE MV Area (PHT): 5.21 cm     SHUNTS MV Area VTI:   3.10 cm     Systemic VTI:  0.21 m MV Peak grad:  2.5 mmHg     Systemic Diam: 2.30 cm MV Mean grad:  1.0 mmHg MV Vmax:       0.79 m/s MV Vmean:      50.2 cm/s MV Decel Time: 146 msec MV E velocity: 135.33 cm/s MV A velocity: 44.10 cm/s MV E/A ratio:  3.07 Bartholome Bill MD Electronically signed by Bartholome Bill MD Signature Date/Time: 11/15/2020/4:35:55 PM    Final     Scheduled Meds:  acidophilus  1 capsule Oral q morning - 10a   amLODipine  5 mg Oral Daily   apixaban  5 mg Oral BID   vitamin C  500 mg Oral BID   cholecalciferol  1,000 Units Oral Daily   furosemide  40 mg Intravenous Q12H   gabapentin  100 mg Oral BID   hydrALAZINE  50 mg Oral Q8H   insulin glargine  45 Units  Subcutaneous Q2200   isosorbide mononitrate  30 mg Oral Daily   lactase  3,000 Units Oral TID WC   magnesium oxide  400 mg Oral Daily   multivitamin with minerals  1 tablet Oral Daily   pantoprazole  40 mg Oral Daily   potassium chloride  10 mEq Oral Daily   Ensure Max Protein  11 oz Oral BID BM   simvastatin  20 mg Oral QHS   traZODone  50 mg Oral QHS   Continuous Infusions:   LOS: 1 day   Marylu Lund, MD [facility] Pager On Amion  If 7PM-7AM, please contact night-coverage 11/16/2020, 4:38 PM

## 2020-11-16 NOTE — Progress Notes (Addendum)
Patient is resting quietly. No complaints at this time.

## 2020-11-16 NOTE — ED Notes (Signed)
Message sent to pharmacy regarding lantus syringe that was sent up only having 40 units instead of 45 units.  Awaiting new syringe.

## 2020-11-17 DIAGNOSIS — R0600 Dyspnea, unspecified: Secondary | ICD-10-CM | POA: Diagnosis not present

## 2020-11-17 DIAGNOSIS — I509 Heart failure, unspecified: Secondary | ICD-10-CM | POA: Diagnosis not present

## 2020-11-17 LAB — COMPREHENSIVE METABOLIC PANEL
ALT: 10 U/L (ref 0–44)
AST: 12 U/L — ABNORMAL LOW (ref 15–41)
Albumin: 3 g/dL — ABNORMAL LOW (ref 3.5–5.0)
Alkaline Phosphatase: 78 U/L (ref 38–126)
Anion gap: 11 (ref 5–15)
BUN: 44 mg/dL — ABNORMAL HIGH (ref 8–23)
CO2: 28 mmol/L (ref 22–32)
Calcium: 8.9 mg/dL (ref 8.9–10.3)
Chloride: 97 mmol/L — ABNORMAL LOW (ref 98–111)
Creatinine, Ser: 1.83 mg/dL — ABNORMAL HIGH (ref 0.61–1.24)
GFR, Estimated: 38 mL/min — ABNORMAL LOW (ref >60)
Glucose, Bld: 144 mg/dL — ABNORMAL HIGH (ref 70–99)
Potassium: 4 mmol/L (ref 3.5–5.1)
Sodium: 136 mmol/L (ref 135–145)
Total Bilirubin: 0.8 mg/dL (ref 0.3–1.2)
Total Protein: 6.1 g/dL — ABNORMAL LOW (ref 6.5–8.1)

## 2020-11-17 LAB — CBC
HCT: 35.1 % — ABNORMAL LOW (ref 39.0–52.0)
Hemoglobin: 10.8 g/dL — ABNORMAL LOW (ref 13.0–17.0)
MCH: 24.3 pg — ABNORMAL LOW (ref 26.0–34.0)
MCHC: 30.8 g/dL (ref 30.0–36.0)
MCV: 79.1 fL — ABNORMAL LOW (ref 80.0–100.0)
Platelets: 267 10*3/uL (ref 150–400)
RBC: 4.44 MIL/uL (ref 4.22–5.81)
RDW: 17.2 % — ABNORMAL HIGH (ref 11.5–15.5)
WBC: 7.6 10*3/uL (ref 4.0–10.5)
nRBC: 0 % (ref 0.0–0.2)

## 2020-11-17 LAB — GLUCOSE, CAPILLARY
Glucose-Capillary: 115 mg/dL — ABNORMAL HIGH (ref 70–99)
Glucose-Capillary: 263 mg/dL — ABNORMAL HIGH (ref 70–99)
Glucose-Capillary: 218 mg/dL — ABNORMAL HIGH (ref 70–99)
Glucose-Capillary: 208 mg/dL — ABNORMAL HIGH (ref 70–99)

## 2020-11-17 LAB — MAGNESIUM: Magnesium: 2.3 mg/dL (ref 1.7–2.4)

## 2020-11-17 NOTE — Progress Notes (Signed)
PROGRESS NOTE    [name]  KGY:[id number] DOB: 1946-03-11 DOA: 11/15/2020 PCP: [name], MD    Brief Narrative:  75 y.o. Caucasian male with a known history of atrial fibrillation, coronary artery disease, type 2 diabetes mellitus, COPD, combined systolic and diastolic CHF, dyslipidemia, peripheral vascular disease, who presented to the ER with acute onset of dyspnea as well as dry cough and wheezing which have been going on over the last month but have significantly worsened since last night. He admitted to bilateral lower extremity edema as well as orthopnea.  No fever or chills.  No chest pain or palpitations.  No dysuria, oliguria or hematuria or flank pain.  He continues to smoke half a pack of cigarettes per day.   Assessment & Plan:   Active Problems:   Acute CHF (congestive heart failure) ([place])   1.  Acute on chronic combined systolic and diastolic CHF with subsequent acute respiratory failure with hypoxia requiring BiPAP. - Initially required bipap, since weaned off - The patient's most recent 2D echo was in February of last year and revealed an EF of 35 to 40% with grade 1 diastolic dysfunction, mild left atrial dilatation and right atrial dilatation and mild mitral valve regurgitation as well as mild aortic valve stenosis. - Overall improved and weaned to room air, however pt still visibly appears to have increased resp effort -Cont lasix IV as tolerated - Repeat 2D echo with EF 50-55% -recheck bmet in AM - Staff reports pt does appear weaker when attempting to ambulate in hall. Will consult PT  2.  Essential hypertension. - We will continue Norvasc and hydralazine as tolerated  3.  Type 2 diabetes mellitus with peripheral neuropathy. - The patient will be placed on supplement coverage with NovoLog. -Continued on Neurontin as pt toleraets  4.  Coronary artery disease. - Continued on statin therapy as well as Imdur.  5.  Chronic atrial fibrillation with  controlled ventricular response. -Continued on Eliquis. -No evidence of acute blood loss at this time  6.  Ongoing tobacco abuse. - Cessation done at bedside at presentation - Pt had declined nicotine patch  7.  DVT prophylaxis. - We will continue Eliquis per above  DVT prophylaxis: Eliquis Code Status: Full Family Communication: Pt in room, family not at bedside  Status is: Inpatient  Remains inpatient appropriate because:IV treatments appropriate due to intensity of illness or inability to take PO and Inpatient level of care appropriate due to severity of illness   Dispo: The patient is from: Home              Anticipated d/c is to: Home              Anticipated d/c date is: 2 days              Patient currently is not medically stable to d/c.  Consultants:     Procedures:     Antimicrobials: Anti-infectives (From admission, onward)   None      Subjective: States breathing better  Objective: Vitals:   11/17/20 0209 11/17/20 0450 11/17/20 1321 11/17/20 1622  BP: (!) 140/42 (!) 148/39 (!) 137/39 137/86  Pulse: 62 60 (!) 59 70  Resp: 20 16 16 16   Temp: 98.7 F (37.1 C) 98.2 F (36.8 C) 98 F (36.7 C) 98 F (36.7 C)  TempSrc: Oral   Oral  SpO2: 98% 97% 96% 94%  Weight:  73.1 kg    Height:  Intake/Output Summary (Last 24 hours) at 11/17/2020 1729 Last data filed at 11/17/2020 0531 Gross per 24 hour  Intake 120 ml  Output 650 ml  Net -530 ml   Filed Weights   11/15/20 0152 11/17/20 0450  Weight: 74.8 kg 73.1 kg    Examination: General exam: Conversant, in no acute distress Respiratory system: normal chest rise, clear, no audible wheezing, increased resp effort Cardiovascular system: regular rhythm, s1-s2 Gastrointestinal system: Nondistended, nontender, pos BS Central nervous system: No seizures, no tremors Extremities: No cyanosis, no joint deformities Skin: No rashes, no pallor Psychiatry: Affect normal // no auditory hallucinations    Data Reviewed: I have personally reviewed following labs and imaging studies  CBC: Recent Labs  Lab 11/15/20 0150 11/16/20 0900 11/17/20 0326  WBC 10.1 8.1 7.6  NEUTROABS 7.9*  --   --   HGB 11.5* 11.9* 10.8*  HCT 37.2* 38.3* 35.1*  MCV 78.6* 78.6* 79.1*  PLT 262 282 854   Basic Metabolic Panel: Recent Labs  Lab 11/15/20 0314 11/16/20 0537 11/17/20 0326  NA 132* 138 136  K 4.2 4.1 4.0  CL 97* 99 97*  CO2 25 29 28   GLUCOSE 241* 175* 144*  BUN 41* 35* 44*  CREATININE 2.29* 1.80* 1.83*  CALCIUM 8.5* 9.0 8.9  MG  --   --  2.3   GFR: Estimated Creatinine Clearance: 31.5 mL/min (A) (by C-G formula based on SCr of 1.83 mg/dL (H)). Liver Function Tests: Recent Labs  Lab 11/15/20 0314 11/17/20 0326  AST 14* 12*  ALT 10 10  ALKPHOS 92 78  BILITOT 0.9 0.8  PROT 6.3* 6.1*  ALBUMIN 3.1* 3.0*   Recent Labs  Lab 11/15/20 0314  LIPASE 20   No results for input(s): AMMONIA in the last 168 hours. Coagulation Profile: No results for input(s): INR, PROTIME in the last 168 hours. Cardiac Enzymes: No results for input(s): CKTOTAL, CKMB, CKMBINDEX, TROPONINI in the last 168 hours. BNP (last 3 results) No results for input(s): PROBNP in the last 8760 hours. HbA1C: No results for input(s): HGBA1C in the last 72 hours. CBG: Recent Labs  Lab 11/16/20 0617 11/17/20 0904 11/17/20 1347 11/17/20 1620  GLUCAP 155* 115* 263* 218*   Lipid Profile: No results for input(s): CHOL, HDL, LDLCALC, TRIG, CHOLHDL, LDLDIRECT in the last 72 hours. Thyroid Function Tests: Recent Labs    11/15/20 0314 11/16/20 0537  TSH 5.106*  --   FREET4  --  1.10   Anemia Panel: No results for input(s): VITAMINB12, FOLATE, FERRITIN, TIBC, IRON, RETICCTPCT in the last 72 hours. Sepsis Labs: No results for input(s): PROCALCITON, LATICACIDVEN in the last 168 hours.  Recent Results (from the past 240 hour(s))  Resp Panel by RT-PCR (Flu A&B, Covid) Nasopharyngeal Swab     Status: None    Collection Time: 11/15/20  3:14 AM   Specimen: Nasopharyngeal Swab; Nasopharyngeal(NP) swabs in vial transport medium  Result Value Ref Range Status   SARS Coronavirus 2 by RT PCR NEGATIVE NEGATIVE Final    Comment: (NOTE) SARS-CoV-2 target nucleic acids are NOT DETECTED.  The SARS-CoV-2 RNA is generally detectable in upper respiratory specimens during the acute phase of infection. The lowest concentration of SARS-CoV-2 viral copies this assay can detect is 138 copies/mL. A negative result does not preclude SARS-Cov-2 infection and should not be used as the sole basis for treatment or other patient management decisions. A negative result may occur with  improper specimen collection/handling, submission of specimen other than nasopharyngeal swab, presence of  viral mutation(s) within the areas targeted by this assay, and inadequate number of viral copies(<138 copies/mL). A negative result must be combined with clinical observations, patient history, and epidemiological information. The expected result is Negative.  Fact Sheet for Patients:  EntrepreneurPulse.com.au  Fact Sheet for Healthcare Providers:  IncredibleEmployment.be  This test is no t yet approved or cleared by the [place] FDA and  has been authorized for detection and/or diagnosis of SARS-CoV-2 by FDA under an Emergency Use Authorization (EUA). This EUA will remain  in effect (meaning this test can be used) for the duration of the COVID-19 declaration under Section 564(b)(1) of the Act, 21 U.S.C.section 360bbb-3(b)(1), unless the authorization is terminated  or revoked sooner.       Influenza A by PCR NEGATIVE NEGATIVE Final   Influenza B by PCR NEGATIVE NEGATIVE Final    Comment: (NOTE) The Xpert Xpress SARS-CoV-2/FLU/RSV plus assay is intended as an aid in the diagnosis of influenza from Nasopharyngeal swab specimens and should not be used as a sole basis for treatment.  Nasal washings and aspirates are unacceptable for Xpert Xpress SARS-CoV-2/FLU/RSV testing.  Fact Sheet for Patients: EntrepreneurPulse.com.au  Fact Sheet for Healthcare Providers: IncredibleEmployment.be  This test is not yet approved or cleared by the [place] FDA and has been authorized for detection and/or diagnosis of SARS-CoV-2 by FDA under an Emergency Use Authorization (EUA). This EUA will remain in effect (meaning this test can be used) for the duration of the COVID-19 declaration under Section 564(b)(1) of the Act, 21 U.S.C. section 360bbb-3(b)(1), unless the authorization is terminated or revoked.  Performed at [facility], [address] [place], [place] [address]      Radiology Studies: No results found.  Scheduled Meds: . acidophilus  1 capsule Oral q morning - 10a  . amLODipine  5 mg Oral Daily  . apixaban  5 mg Oral BID  . vitamin C  500 mg Oral BID  . cholecalciferol  1,000 Units Oral Daily  . furosemide  40 mg Intravenous Q12H  . gabapentin  100 mg Oral BID  . hydrALAZINE  50 mg Oral Q8H  . insulin glargine  45 Units Subcutaneous Q2200  . isosorbide mononitrate  30 mg Oral Daily  . lactase  3,000 Units Oral TID WC  . magnesium oxide  400 mg Oral Daily  . multivitamin with minerals  1 tablet Oral Daily  . pantoprazole  40 mg Oral Daily  . potassium chloride  10 mEq Oral Daily  . Ensure Max Protein  11 oz Oral BID BM  . simvastatin  20 mg Oral QHS  . traZODone  50 mg Oral QHS   Continuous Infusions:   LOS: 2 days   Marylu Lund, MD [facility] Pager On Amion  If 7PM-7AM, please contact night-coverage 11/17/2020, 5:29 PM

## 2020-11-17 NOTE — Progress Notes (Signed)
Patient ambulated on RA and O2 sat stayed above 90% with ambulation and resting 93%.  Patient states he normally walks with a cane but per ambulation aide, he was unsteady with a cane and walked much better with wheeled walker.

## 2020-11-17 NOTE — TOC Progression Note (Addendum)
Transition of Care [facility]) - Progression Note    Patient Details  Name: [name] MRN: [id number] Date of Birth: Sep 09, 1946  Transition of [facility]) CM/SW Contact  [name], RN Phone Number: 11/17/2020, 9:32 AM  Clinical Narrative:    11/17/20 0930 . TOC consult 1/13 for HFHS/PT/OT eval if needed.  Marland Kitchen HF RN Navigator saw patient in ER on 1/13 per progress note. Marland Kitchen HF RN noted patient was in hospital 12/22-12/26.  . Was seen in HF clinic last week, but continues to smoke, was eating frozen dinners last week.  . Significant co-morbidities present.  . This admission: "Presented with cough and wheezing which have been going on over the last month but have significantly worsened since last night. He admitted to bilateral lower extremity edema as well as orthopnea" per provider ED notes.  . TOC will follow per consult.  [name] RN CM         Expected Discharge Plan and Services                                                 Social Determinants of Health (SDOH) Interventions    Readmission Risk Interventions Readmission Risk Prevention Plan 10/28/2020 12/27/2019  Transportation Screening Complete Complete  PCP or Specialist Appt within 3-5 Days - Complete  HRI or [place] - Complete  Medication Review ([place]) Complete Complete  PCP or Specialist appointment within 3-5 days of discharge Complete -  [place] or Home Care Consult Complete -  Palliative Care Screening Not Applicable -  [place] Not Applicable -  Some recent data might be hidden

## 2020-11-17 NOTE — Progress Notes (Signed)
Mobility Specialist - Progress Note   11/17/20 1554  Mobility  Activity Ambulated in room  Level of Assistance Contact guard assist, steadying assist (min. assist w/ S2S for boosting)  Assistive Device Front wheel walker  Distance Ambulated (ft) 20 ft  Mobility Response Tolerated well  Mobility performed by Mobility specialist  $Mobility charge 1 Mobility    Pre-mobility: 62 HR, 95% SpO2 During mobility: 81 HR, 91% SpO2 Post-mobility: 58 HR, 93% SpO2   Pt received long-sitting in bed, resting on 2L O2 [place] upon arrival. Pt agreed to session. Per nurse request, ambulate pt on RA. Pt able to get to EOB w/ min. Assist for LE support. Per pt and nurse, pt ambulates on RA w/ a cane at baseline. Attempted to S2S x3 utilizing personal cane. Pt failed each attempt. Pt unable to hold balance utilizing cane. Attempted to S2S utilizing RW. With min. Assist for boosting, pt able to S2S to RW. Pt progressed to ambulate 20' total in room utilizing RW w/ CGA for steadying. No LOB noted. VC required t/o session for safety. No c/o pain or SOB. O2 sat > 90% on RA t/o session. Overall, pt tolerated session well. Pt left laying in bed w/ all needs placed in reach. NT entered room at the end of session.     Mcadoo Muz[place]iz Mobility Specialist  11/17/20, 4:01 PM

## 2020-11-17 NOTE — Progress Notes (Addendum)
Central telle called stating patient was in sinus brady with a heart rate of 39. Patient was sleeping but easily aroused. Obtained vitals on patient, vitals are normal. Patients heart rate increased to 50's/60's once awake. NP Randol Kern notified, no new orders. Continue to monitor.

## 2020-11-18 LAB — COMPREHENSIVE METABOLIC PANEL
ALT: 10 U/L (ref 0–44)
AST: 13 U/L — ABNORMAL LOW (ref 15–41)
Albumin: 3 g/dL — ABNORMAL LOW (ref 3.5–5.0)
Alkaline Phosphatase: 81 U/L (ref 38–126)
Anion gap: 10 (ref 5–15)
BUN: 42 mg/dL — ABNORMAL HIGH (ref 8–23)
CO2: 28 mmol/L (ref 22–32)
Calcium: 8.9 mg/dL (ref 8.9–10.3)
Chloride: 97 mmol/L — ABNORMAL LOW (ref 98–111)
Creatinine, Ser: 1.68 mg/dL — ABNORMAL HIGH (ref 0.61–1.24)
GFR, Estimated: 42 mL/min — ABNORMAL LOW (ref >60)
Glucose, Bld: 106 mg/dL — ABNORMAL HIGH (ref 70–99)
Potassium: 3.9 mmol/L (ref 3.5–5.1)
Sodium: 135 mmol/L (ref 135–145)
Total Bilirubin: 0.7 mg/dL (ref 0.3–1.2)
Total Protein: 6.1 g/dL — ABNORMAL LOW (ref 6.5–8.1)

## 2020-11-18 LAB — GLUCOSE, CAPILLARY
Glucose-Capillary: 71 mg/dL (ref 70–99)
Glucose-Capillary: 286 mg/dL — ABNORMAL HIGH (ref 70–99)

## 2020-11-18 NOTE — TOC Progression Note (Signed)
Transition of Care [facility]) - Progression Note    Patient Details  Name: [name] MRN: [id number] Date of Birth: 12-06-1945  Transition [facility] Care [facility]) CM/SW Contact  [name], RN Phone Number: 11/18/2020, 3:15 PM  Clinical Narrative:     09/18/21 1515 Provider placed TOC for HH/DME needs for possible Dc tomorrow. Patient does not quality for home oxygen but wants to speak to oxygen company about paying out of pocket for it anyway per provider communication.  [name] RN CM       Expected Discharge Plan and Services                                                 Social Determinants of Health (SDOH) Interventions    Readmission Risk Interventions Readmission Risk Prevention Plan 10/28/2020 12/27/2019  Transportation Screening Complete Complete  PCP or Specialist Appt within 3-5 Days - Complete  HRI or [place] - Complete  Medication Review ([place]) Complete Complete  PCP or Specialist appointment within 3-5 days of discharge Complete -  [place] or Home Care Consult Complete -  Palliative Care Screening Not Applicable -  [place] Not Applicable -  Some recent data might be hidden

## 2020-11-18 NOTE — Progress Notes (Signed)
Patient ambulated in room with PT this am.  Per PT, on 2L [place] while ambulating, O2 dropped to 80% sat back in bed and after approximately 30 seconds went back up to 93%.

## 2020-11-18 NOTE — Evaluation (Signed)
Physical Therapy Evaluation Patient Details Name: [name] MRN: [id number] DOB: 07-20-1946 Today's Date: 11/18/2020   History of Present Illness  75 y.o. Caucasian male with a known history of atrial fibrillation, coronary artery disease, type 2 diabetes mellitus, COPD, combined systolic and diastolic CHF, dyslipidemia, peripheral vascular disease, who presented to the ER with acute onset of dyspnea as well as dry cough and wheezing. He presented to the ED with BIL LE. Not on oxygen at home.  Clinical Impression  Pt received by PT in bed, agreeable to PT evaluation today.  Resting SpO2: 99%. Pt able to complete bed mobility supine<>sit with CGA for safety, pt provided VCs for hand placement. Completed sit<>stand transfer x2 with minA for safety and walker management.  Ambulated with RW and 2L O2 via nasal cannula for 16 ft with VCs provided to patient for pursed lip breathing and to stand with a more upright posture allowing for improved respiration.  Therapeutic activites completed: standing marches x10 each leg, ankle pumps x10 BIL.  Educated patient on pursed lip breathing, RW management and fall preventions strategies including slow position change and appropriate RW use.  Pt verbalized understanding of education. Pt left seated in bed, bed alarm set with nurse present. Overall pt tolerated interventions well today, some mild increase in fatigue.     Follow Up Recommendations Home health PT    Equipment Recommendations  Standard walker    Recommendations for Other Services       Precautions / Restrictions Precautions Precautions: None Restrictions Weight Bearing Restrictions: No      Mobility  Bed Mobility Overal bed mobility: Modified Independent Bed Mobility: Supine to Sit     Supine to sit: Supervision Sit to supine: Min guard   General bed mobility comments: Pt requires increased time to complete activity due to fatigue and weakness    Transfers Overall transfer  level: Needs assistance Equipment used: Rolling walker (2 wheeled) Transfers: Sit to/from Stand Sit to Stand: Min assist         General transfer comment: Pt completed sit<>stand from bed with RW, minA for safety, VCs for appropriate hand placement  Ambulation/Gait Ambulation/Gait assistance: Min assist Gait Distance (Feet): 16 Feet Assistive device: Rolling walker (2 wheeled)   Gait velocity: Decreased - requires increased time to ambulate due to fatigue and decreaed endurance   General Gait Details: Pt able to ambulate with RW, minA, VCs for pursed lip breathing and postural correction to stand more upright.  Pt SpO2: 80% during ambulation, returned to 94% upon sitting for 30 seconds.  Stairs            Wheelchair Mobility    Modified Rankin (Stroke Patients Only)       Balance Overall balance assessment: Needs assistance Sitting-balance support: Feet supported Sitting balance-Leahy Scale: Good Sitting balance - Comments: Pt able to maintain seated position EOB safely with feet supported   Standing balance support: Bilateral upper extremity supported Standing balance-Leahy Scale: Fair Standing balance comment: Pt tends to list posteriorly upon standing requiring min A to make postural correction and maintain standing balance                             Pertinent Vitals/Pain Pain Assessment: No/denies pain    Home Living Family/patient expects to be discharged to:: Private residence Living Arrangements: Spouse/significant other Available Help at Discharge: Family;Available PRN/intermittently Type of Home: House Home Access: Stairs to enter Entrance Stairs-Rails: None Entrance  Stairs-Number of Steps: 2 Home Layout: One level Home Equipment: Shower seat;Cane - single point;Wheelchair - [employer] - 2 wheels;Electric scooter      Prior Function Level of Independence: Needs assistance;Independent with assistive device(s)   Gait / Transfers  Assistance Needed: MOD I with SPC household distances, electric scooter community distances. Endorses falls hx. Reports not enough space in house for RW - wife plans to relocate excess items to new shed  ADL's / Homemaking Assistance Needed: Fatigues quickly, MOD I for ADLs  Comments: No home O2     Hand Dominance   Dominant Hand: Right    Extremity/Trunk Assessment   Upper Extremity Assessment Upper Extremity Assessment: Generalized weakness    Lower Extremity Assessment Lower Extremity Assessment: Generalized weakness    Cervical / Trunk Assessment Cervical / Trunk Assessment: Kyphotic  Communication   Communication: HOH  Cognition Arousal/Alertness: Awake/alert Behavior During Therapy: WFL for tasks assessed/performed Overall Cognitive Status: Within Functional Limits for tasks assessed                  General Comments: Pt is A &O x4. Plesant and agreeable to participate with PT intercentions today      General Comments General comments (skin integrity, edema, etc.): SpO2 stable 90-95% on RA t/o session    Exercises    Assessment/Plan    PT Assessment Patient needs continued PT services  PT Problem List Decreased strength;Decreased activity tolerance;Decreased balance;Decreased mobility       PT Treatment Interventions Gait training;Stair training;Functional mobility training;Therapeutic activities;Therapeutic exercise;Balance training    PT Goals (Current goals can be found in the Care Plan section)  Acute Rehab PT Goals Patient Stated Goal: To improve strength and return home PT Goal Formulation: With patient Time For Goal Achievement: 12/02/20 Potential to Achieve Goals: Good    Frequency Min 2X/week   Barriers to discharge        Co-evaluation               AM-PAC PT "6 Clicks" Mobility  Outcome Measure Help needed turning from your back to your side while in a flat bed without using bedrails?: None Help needed moving from lying on  your back to sitting on the side of a flat bed without using bedrails?: None Help needed moving to and from a bed to a chair (including a wheelchair)?: A Little Help needed standing up from a chair using your arms (e.g., wheelchair or bedside chair)?: A Little Help needed to walk in hospital room?: A Little Help needed climbing 3-5 steps with a railing? : A Lot 6 Click Score: 19    End of Session Equipment Utilized During Treatment: Gait belt Activity Tolerance: Patient tolerated treatment well Patient left: in bed;with call bell/phone within reach;with bed alarm set Nurse Communication: Mobility status PT Visit Diagnosis: Unsteadiness on feet (R26.81);Other abnormalities of gait and mobility (R26.89);Muscle weakness (generalized) (M62.81);Difficulty in walking, not elsewhere classified (R26.2)    Time: 5784-6962 PT Time Calculation (min) (ACUTE ONLY): 53 min   Charges:   PT Evaluation $PT Eval Low Complexity: 1 Low PT Treatments $Gait Training: 8-22 mins $Therapeutic Activity: 8-22 mins        Duanne Guess, PT, DPT 11/18/20, 12:58 PM   [name] 11/18/2020, 12:48 PM

## 2020-11-18 NOTE — Evaluation (Signed)
Occupational Therapy Evaluation Patient Details Name: [name] MRN: [id number] DOB: 05-27-1946 Today's Date: 11/18/2020    History of Present Illness [name] is a 75 y.o. male with a past medical history of A. fib on Eliquis, CHF with an EF estimate of 35 to 40%, COPD, ongoing tobacco abuse, CAD, DM, HTN, HDL, GERD, PVD, and prostate cancer not currently undergoing treatment presents EMS from home for assessment of some acute onset of shortness of breath.   Clinical Impression   [name] was seen for OT evaluation this date. Prior to hospital admission, pt was MOD I for mobility using [facility] inhouse and electric scooter in community. Pt lives c wife in 1 level home c 2 STE - reports not enough space for RW in home but wife plans to make space in new storage shed. Pt presents to acute OT demonstrating impaired ADL performance and functional mobility 2/2 decreased activity tolerance and functional balance deficits.   Pt currently requires MIN A + RW toilet t/f. SBA + RW for standing grooming tasks, tolerated ~5 mins standing. MOD I for LB access seated EOB. Pt would benefit from skilled OT to address noted impairments and functional limitations (see below for any additional details) in order to maximize safety and independence while minimizing falls risk and caregiver burden. Upon hospital discharge, recommend HHOT to maximize pt safety and return to functional independence during meaningful occupations of daily life.     Follow Up Recommendations  Home health OT;Supervision - Intermittent    Equipment Recommendations  3 in 1 bedside commode    Recommendations for Other Services       Precautions / Restrictions Precautions Precautions: None Restrictions Weight Bearing Restrictions: No      Mobility Bed Mobility Overal bed mobility: Modified Independent Bed Mobility: Supine to Sit           General bed mobility comments: Increased time    Transfers Overall transfer level:  Needs assistance Equipment used: Rolling walker (2 wheeled) Transfers: Sit to/from Stand Sit to Stand: Min assist              Balance Overall balance assessment: Needs assistance Sitting-balance support: Feet supported Sitting balance-Leahy Scale: Good     Standing balance support: No upper extremity supported Standing balance-Leahy Scale: Fair                             ADL either performed or assessed with clinical judgement   ADL Overall ADL's : Needs assistance/impaired                                       General ADL Comments: MIN A + RW toilet t/f. SBA + RW for standing grooming tasks, tolerated ~5 mins standing. MOD I for LB access seated EOB.                  Pertinent Vitals/Pain Pain Assessment: No/denies pain     Hand Dominance Right   Extremity/Trunk Assessment Upper Extremity Assessment Upper Extremity Assessment: Generalized weakness   Lower Extremity Assessment Lower Extremity Assessment: Generalized weakness   Cervical / Trunk Assessment Cervical / Trunk Assessment: Kyphotic   Communication Communication Communication: HOH   Cognition Arousal/Alertness: Awake/alert Behavior During Therapy: WFL for tasks assessed/performed Overall Cognitive Status: Within Functional Limits for tasks assessed  General Comments: Pt is A &O x4. Plesant and agreeable   General Comments  SpO2 stable 90-95% on RA t/o session    Exercises Exercises: Other exercises Other Exercises Other Exercises: Pt educated re: OT role, DME recs, d/c recs, falls prevention, ECS, and HEP Other Exercises: LBD, grooming, self-feeding, sup>sit, sit<>stand, ~15 ft in roommobility, sitting/standing balance/toelrance   Shoulder Instructions      Home Living Family/patient expects to be discharged to:: Private residence Living Arrangements: Spouse/significant other Available Help at Discharge:  Family;Available PRN/intermittently Type of Home: House Home Access: Stairs to enter [employer] of Steps: 2   Home Layout: One level     Bathroom Shower/Tub: Teacher, early years/pre: Standard     Home Equipment: Shower seat;Cane - single point;Wheelchair - [employer] - 2 wheels;Electric scooter          Prior Functioning/Environment Level of Independence: Needs assistance;Independent with assistive device(s)  Gait / Transfers Assistance Needed: MOD I with SPC household distances, electric scooter community distances. Endorses falls hx. Reports not enough space in house for RW - wife plans to relocate excess items to new shed ADL's / Homemaking Assistance Needed: Fatigues quickly, MOD I for ADLs            OT Problem List: Decreased strength;Decreased activity tolerance;Decreased safety awareness;Impaired balance (sitting and/or standing);Decreased knowledge of precautions;Decreased knowledge of use of DME or AE;Cardiopulmonary status limiting activity      OT Treatment/Interventions: Self-care/ADL training;Therapeutic exercise;Balance training;Therapeutic activities;Energy conservation;DME and/or AE instruction;Patient/family education    OT Goals(Current goals can be found in the care plan section) Acute Rehab OT Goals Patient Stated Goal: to get stronger and go home. OT Goal Formulation: With patient Time For Goal Achievement: 12/02/20 Potential to Achieve Goals: Good ADL Goals Pt Will Perform Lower Body Dressing: with modified independence;sit to/from stand (c LRAD PRN) Pt Will Transfer to Toilet: with modified independence;ambulating;regular height toilet (c LRAD PRN) Additional ADL Goal #1: Pt will Independently verbalize plan to implement x3 ECS  OT Frequency: Min 1X/week   Barriers to D/C: Inaccessible home environment             AM-PAC OT "6 Clicks" Daily Activity     Outcome Measure Help from another person eating meals?:  None Help from another person taking care of personal grooming?: A Little Help from another person toileting, which includes using toliet, bedpan, or urinal?: A Little Help from another person bathing (including washing, rinsing, drying)?: A Little Help from another person to put on and taking off regular upper body clothing?: None Help from another person to put on and taking off regular lower body clothing?: A Little 6 Click Score: 20   End of Session Equipment Utilized During Treatment: Rolling walker Nurse Communication: Mobility status  Activity Tolerance: Patient tolerated treatment well Patient left: in chair;with call bell/phone within reach;with chair alarm set  OT Visit Diagnosis: Muscle weakness (generalized) (M62.81);Unsteadiness on feet (R26.81);History of falling (Z91.81)                Time: 4970-2637 OT Time Calculation (min): 25 min Charges:  OT General Charges $OT Visit: 1 Visit OT Evaluation $OT Eval Low Complexity: 1 Low OT Treatments $Self Care/Home Management : 23-37 mins  Dessie Coma, M.S. OTR/L  11/18/20, 10:00 AM  ascom [phone]

## 2020-11-18 NOTE — TOC Progression Note (Signed)
Transition of Care [facility]) - Progression Note    Patient Details  Name: [name] MRN: [id number] Date of Birth: 01-22-1946  [facility]) CM/SW Contact  [name], RN Phone Number: 11/18/2020, 10:07 AM  Clinical Narrative:    11/18/20 1000 OT evaluation 1/15 was completed and recommendation was for [facility] OT with 3:1. Prior disposition was for SNF. Continue to monitor for discharge planning/needs.  [name] RN CM         Expected Discharge Plan and Services                                                 Social Determinants of Health (SDOH) Interventions    Readmission Risk Interventions Readmission Risk Prevention Plan 10/28/2020 12/27/2019  Transportation Screening Complete Complete  PCP or Specialist Appt within 3-5 Days - Complete  HRI or [place] - Complete  Medication Review ([place]) Complete Complete  PCP or Specialist appointment within 3-5 days of discharge Complete -  [place] or Home Care Consult Complete -  Palliative Care Screening Not Applicable -  [place] Not Applicable -  Some recent data might be hidden

## 2020-11-19 DIAGNOSIS — I509 Heart failure, unspecified: Secondary | ICD-10-CM | POA: Diagnosis not present

## 2020-11-19 DIAGNOSIS — I5021 Acute systolic (congestive) heart failure: Secondary | ICD-10-CM | POA: Diagnosis not present

## 2020-11-19 LAB — COMPREHENSIVE METABOLIC PANEL
ALT: 13 U/L (ref 0–44)
AST: 15 U/L (ref 15–41)
Albumin: 3.3 g/dL — ABNORMAL LOW (ref 3.5–5.0)
Alkaline Phosphatase: 86 U/L (ref 38–126)
Anion gap: 9 (ref 5–15)
BUN: 44 mg/dL — ABNORMAL HIGH (ref 8–23)
CO2: 31 mmol/L (ref 22–32)
Calcium: 9.1 mg/dL (ref 8.9–10.3)
Chloride: 96 mmol/L — ABNORMAL LOW (ref 98–111)
Creatinine, Ser: 1.94 mg/dL — ABNORMAL HIGH (ref 0.61–1.24)
GFR, Estimated: 35 mL/min — ABNORMAL LOW (ref >60)
Glucose, Bld: 118 mg/dL — ABNORMAL HIGH (ref 70–99)
Potassium: 4.3 mmol/L (ref 3.5–5.1)
Sodium: 136 mmol/L (ref 135–145)
Total Bilirubin: 0.6 mg/dL (ref 0.3–1.2)
Total Protein: 6.6 g/dL (ref 6.5–8.1)

## 2020-11-19 NOTE — Plan of Care (Signed)
  Problem: Education: Goal: Ability to demonstrate management of disease process will improve 11/19/2020 1438 by Alen Blew, RN Outcome: Adequate for Discharge 11/19/2020 1438 by Alen Blew, RN Outcome: Adequate for Discharge Goal: Ability to verbalize understanding of medication therapies will improve 11/19/2020 1438 by Alen Blew, RN Outcome: Adequate for Discharge 11/19/2020 1438 by Alen Blew, RN Outcome: Adequate for Discharge Goal: Individualized Educational Video(s) 11/19/2020 1438 by Alen Blew, RN Outcome: Adequate for Discharge 11/19/2020 1438 by Alen Blew, RN Outcome: Adequate for Discharge   Problem: Activity: Goal: Capacity to carry out activities will improve 11/19/2020 1438 by Alen Blew, RN Outcome: Adequate for Discharge 11/19/2020 1438 by Alen Blew, RN Outcome: Adequate for Discharge   Problem: Cardiac: Goal: Ability to achieve and maintain adequate cardiopulmonary perfusion will improve 11/19/2020 1438 by Alen Blew, RN Outcome: Adequate for Discharge 11/19/2020 1438 by Alen Blew, RN Outcome: Adequate for Discharge   Problem: Education: Goal: Knowledge of General Education information will improve Description: Including pain rating scale, medication(s)/side effects and non-pharmacologic comfort measures 11/19/2020 1438 by Alen Blew, RN Outcome: Adequate for Discharge 11/19/2020 1438 by Alen Blew, RN Outcome: Adequate for Discharge   Problem: Health Behavior/Discharge Planning: Goal: Ability to manage health-related needs will improve 11/19/2020 1438 by Alen Blew, RN Outcome: Adequate for Discharge 11/19/2020 1438 by Alen Blew, RN Outcome: Adequate for Discharge   Problem: Clinical Measurements: Goal: Ability to maintain clinical measurements within normal limits will improve 11/19/2020 1438 by Roanna Epley D, RN Outcome: Adequate for Discharge 11/19/2020 1438 by Alen Blew, RN Outcome: Adequate for Discharge Goal: Will remain free from infection 11/19/2020 1438 by Roanna Epley D, RN Outcome: Adequate for Discharge 11/19/2020 1438 by Alen Blew, RN Outcome: Adequate for Discharge Goal: Diagnostic test results will improve 11/19/2020 1438 by Alen Blew, RN Outcome: Adequate for Discharge 11/19/2020 1438 by Alen Blew, RN Outcome: Adequate for Discharge Goal: Respiratory complications will improve 11/19/2020 1438 by Alen Blew, RN Outcome: Adequate for Discharge 11/19/2020 1438 by Alen Blew, RN Outcome: Adequate for Discharge Goal: Cardiovascular complication will be avoided 11/19/2020 1438 by Alen Blew, RN Outcome: Adequate for Discharge 11/19/2020 1438 by Alen Blew, RN Outcome: Adequate for Discharge   Problem: Nutrition: Goal: Adequate nutrition will be maintained 11/19/2020 1438 by Alen Blew, RN Outcome: Adequate for Discharge 11/19/2020 1438 by Alen Blew, RN Outcome: Adequate for Discharge   Problem: Activity: Goal: Risk for activity intolerance will decrease 11/19/2020 1438 by Alen Blew, RN Outcome: Adequate for Discharge 11/19/2020 1438 by Alen Blew, RN Outcome: Adequate for Discharge   Problem: Coping: Goal: Level of anxiety will decrease 11/19/2020 1438 by Alen Blew, RN Outcome: Adequate for Discharge 11/19/2020 1438 by Alen Blew, RN Outcome: Adequate for Discharge   Problem: Pain Managment: Goal: General experience of comfort will improve 11/19/2020 1438 by Alen Blew, RN Outcome: Adequate for Discharge 11/19/2020 1438 by Alen Blew, RN Outcome: Adequate for Discharge   Problem: Safety: Goal: Ability to remain free from injury will improve 11/19/2020 1438 by Alen Blew, RN Outcome: Adequate for Discharge 11/19/2020 1438 by Alen Blew, RN Outcome: Adequate for Discharge   Problem: Skin Integrity: Goal: Risk for impaired  skin integrity will decrease 11/19/2020 1438 by Alen Blew, RN Outcome: Adequate for Discharge 11/19/2020 1438 by Alen Blew, RN Outcome: Adequate for Discharge

## 2020-11-19 NOTE — Discharge Summary (Signed)
Physician Discharge Summary  [name] KZS:[id number] DOB: 28-Jul-1946 DOA: 11/15/2020  PCP: [name], MD  Admit date: 11/15/2020 Discharge date: 11/19/2020  Admitted From: Home Disposition:  Home  Recommendations for Outpatient Follow-up:  1. Follow up with PCP in 1-2 weeks 2. Follow up with Cardiology as scheduled  Home Health:PT, OT   Discharge Condition:Improved CODE STATUS:Full Diet recommendation: Heart healthy, diabetic   Brief/Interim Summary: 75 y.o.Caucasian malewith a known history of atrial fibrillation, coronary artery disease, type 2 diabetes mellitus, COPD, combined systolic and diastolic CHF, dyslipidemia, peripheral vascular disease, who presented to the ER with acute onset ofdyspnea as well as dry cough and wheezing which have been going onover the last month but have significantly worsened since last night.He admitted to bilateral lower extremity edema as well as orthopnea. No fever or chills. No chest pain or palpitations. No dysuria, oliguria or hematuria or flank pain. He continues to smoke half a pack of cigarettes per day.   Discharge Diagnoses:  Active Problems:   Acute CHF (congestive heart failure) ([place])   1. 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 -Initially required bipap, since weaned off -The patient's most recent 2D echo was in February of last year and revealed an EF of 35 to 40% with grade 1 diastolic dysfunction, mild left atrial dilatation and right atrial dilatation and mild mitral valve regurgitation as well as mild aortic valve stenosis. -Overall improved and weaned to room air, however pt still visibly appears to have increased resp effort -Cont lasix IV as tolerated -Repeat 2D echo with EF 50-55% - Staff reported pt did appear weaker when attempting to ambulate in hall. Consulted PT who recommended HHPT, have arranged -Pt has since  improved markedly since presentation. Stable for d/c today  2. Essential hypertension. -Continue Norvasc and hydralazine as tolerated  3. Type 2 diabetes mellitus with peripheral neuropathy. -The patient will be placed on supplement coverage with NovoLog. -Continued on Neurontin as pt tolerates  4. Coronary artery disease. -Continued on statin therapy as well as Imdur.  5. Chronic atrial fibrillation with controlled ventricular response. -Continued on Eliquis. -No evidence of acute blood loss at this time  6. Ongoing tobacco abuse. -Cessation done at bedside at presentation - Pt had declined nicotine patch  7. DVT prophylaxis. -We will continue Eliquis per above   Discharge Instructions   Allergies as of 11/19/2020      Reactions   Lactose Intolerance (gi)       Medication List    STOP taking these medications   doxycycline 100 MG tablet Commonly known as: VIBRA-TABS   furosemide 40 MG tablet Commonly known as: LASIX   isosorbide dinitrate 30 MG tablet Commonly known as: ISORDIL   losartan 25 MG tablet Commonly known as: COZAAR   PREVAGEN PO     TAKE these medications   acetaminophen 325 MG tablet Commonly known as: TYLENOL Take 2 tablets (650 mg total) by mouth every 6 (six) hours as needed for mild pain (or Fever >/= 101).   albuterol 108 (90 Base) MCG/ACT inhaler Commonly known as: VENTOLIN HFA Inhale 2 puffs into the lungs every 6 (six) hours as needed for wheezing or shortness of breath.   amLODipine 5 MG tablet Commonly known as: NORVASC Take 1 tablet (5 mg total) by mouth daily.   apixaban 5 MG Tabs tablet Commonly known as: ELIQUIS Take 1 tablet (5 mg total) by mouth 2 (two) times daily.  bifidobacterium infantis capsule Take 1 capsule by mouth every morning.   cholecalciferol 1000 units tablet Commonly known as: VITAMIN D Take 1,000 Units by mouth daily.   Cinnamon 500 MG capsule Take 1,000 mg by mouth 2 (two) times  daily.   Combivent Respimat 20-100 MCG/ACT Aers respimat Generic drug: Ipratropium-Albuterol Inhale 2 puffs into the lungs 4 (four) times daily as needed.   CRANBERRY EXTRACT PO Take 500 mg by mouth every morning.   Ensure Max Protein Liqd Take 330 mLs (11 oz total) by mouth 2 (two) times daily between meals.   gabapentin 100 MG capsule Commonly known as: NEURONTIN Take 100 mg by mouth 2 (two) times daily.   glucose 4 GM chewable tablet Chew 1 tablet by mouth as needed for low blood sugar.   hydrALAZINE 50 MG tablet Commonly known as: APRESOLINE Take 1 tablet (50 mg total) by mouth every 8 (eight) hours.   insulin aspart 100 UNIT/ML injection Commonly known as: novoLOG 0-9 Units, Subcutaneous, 3 times daily with meals CBG < 70: Implement Hypoglycemia protocol/measures CBG 70 - 120: 0 units CBG 121 - 150: 1 unit CBG 151 - 200: 2 units CBG 201 - 250: 3 units CBG 251 - 300: 5 units CBG 301 - 350: 7 units CBG 351 - 400: 9 units CBG > 400: call MD What changed:   how much to take  how to take this  when to take this  additional instructions   isosorbide mononitrate 30 MG 24 hr tablet Commonly known as: IMDUR Take 1 tablet (30 mg total) by mouth daily.   lactase 3000 units tablet Commonly known as: LACTAID Take 3,000 Units by mouth 3 (three) times daily between meals as needed.   Lantus SoloStar 100 UNIT/ML Solostar Pen Generic drug: insulin glargine Inject 45 Units into the skin daily at 10 pm.   Magnesium 250 MG Tabs Take 250 mg by mouth daily.   multivitamin with minerals Tabs tablet Take 1 tablet by mouth daily.   NON FORMULARY Take 1 Dose by mouth 3 (three) times daily. Relief Factor OTC   omeprazole 20 MG capsule Commonly known as: PRILOSEC Take 20 mg by mouth daily.   potassium chloride 10 MEQ tablet Commonly known as: KLOR-CON Take 10 mEq by mouth daily.   simvastatin 20 MG tablet Commonly known as: ZOCOR Take 20 mg by mouth at bedtime.    SUPER B COMPLEX/C PO Take 1 tablet by mouth every morning.   torsemide 20 MG tablet Commonly known as: DEMADEX Take 2 tablets (40 mg total) by mouth daily.   TOTAL MEMORY & FOCUS FORMULA PO Take 1 capsule by mouth See admin instructions. Take 2 capsule at 9:00 AM, 1 capsule at 5:00 PM, and 1 capsule at bedtime.   traZODone 50 MG tablet Commonly known as: DESYREL Take 50 mg by mouth at bedtime.   vitamin C 500 MG tablet Commonly known as: ASCORBIC ACID Take 500 mg by mouth 2 (two) times daily.       Follow-up Information    [facility] Follow up on 12/03/2020.   Specialty: Cardiology Why: at 10:30am. Enter through the [place] entrance Contact information: [address] [place]       [name], MD. Schedule an appointment as soon as possible for a visit in 2 week(s).   Specialty: Family Medicine Contact information: [address] [facility]  [place] [address] [phone]              Allergies  Allergen Reactions  . Lactose Intolerance (Gi)     Procedures/Studies: DG Chest 1 View  Result Date: 10/23/2020 CLINICAL DATA:  Shortness of breath. EXAM: CHEST  1 VIEW COMPARISON:  June 02, 2020 FINDINGS: Multiple sternal wires and vascular clips are seen. Mild to moderate severity increased interstitial lung markings are seen. There is a small left pleural effusion. No pneumothorax is identified. The heart size and mediastinal contours are within normal limits. Degenerative changes seen throughout the thoracic spine. IMPRESSION: 1. Evidence of prior median sternotomy/CABG. 2. Mild to moderate severity interstitial edema. Electronically Signed   By: Virgina Norfolk M.D.   On: 10/23/2020 23:19   DG Chest Port 1 View  Result Date: 11/15/2020 CLINICAL DATA:  Acute dyspnea EXAM: PORTABLE CHEST 1 VIEW COMPARISON:   10/25/2020 FINDINGS: Diffuse interstitial thickening is unchanged. There is unchanged blunting of the left costophrenic angle. No new area of consolidation. Remote median sternotomy. Cardiomediastinal contours are normal. IMPRESSION: Unchanged diffuse interstitial thickening without new area of consolidation. Left costophrenic angle scarring versus small pleural effusion. Electronically Signed   By: Ulyses Jarred M.D.   On: 11/15/2020 02:13   DG Chest Port 1 View  Result Date: 10/25/2020 CLINICAL DATA:  Respiratory failure EXAM: PORTABLE CHEST 1 VIEW COMPARISON:  October 23, 2020 FINDINGS: There is diffuse interstitial thickening. No edema or consolidation. There is a small left pleural effusion. Heart is borderline enlarged with pulmonary venous hypertension. Status post coronary artery bypass grafting. There is aortic atherosclerosis. No adenopathy. No bone lesions. IMPRESSION: Borderline cardiac enlargement with a degree of pulmonary vascular congestion. Status post coronary artery bypass grafting. Small left pleural effusion. Interstitium is thickened, likely due to chronic bronchitis with questionable mild chronic interstitial edema. No consolidation. There may be a degree of chronic congestive heart failure. Aortic Atherosclerosis (ICD10-I70.0). Electronically Signed   By: Lowella Grip III M.D.   On: 10/25/2020 11:54   ECHOCARDIOGRAM COMPLETE  Result Date: 11/15/2020    ECHOCARDIOGRAM REPORT   Patient Name:   Tykel Frazzini Date of Exam: 11/15/2020 Medical Rec #:  [id number]  Height:       66.0 in Accession #:    [id number] Weight:       165.0 lb Date of Birth:  1946/05/30  BSA:          1.843 m Patient Age:    75 years   BP:           169/52 mmHg Patient Gender: M          HR:           55 bpm. Exam Location:  [facility] Procedure: 2D Echo, Color Doppler, Cardiac Doppler and Intracardiac            Opacification Agent Indications:     I50.31 CHF-Acute Diastolic  History:         Patient has prior history  of Echocardiogram examinations, most                  recent 12/26/2019. Stroke, PVD and COPD; Risk                  Factors:Hypertension, Diabetes and Dyslipidemia.  Sonographer:     Charmayne Sheer RDCS (AE) Referring Phys:  [id number] Arvella Merles [place] Diagnosing Phys: Bartholome Bill MD  Sonographer Comments: Suboptimal apical window and suboptimal subcostal window. Image acquisition challenging due to  COPD. IMPRESSIONS  1. Left ventricular ejection fraction, by estimation, is 50 to 55%. The left ventricle has low normal function. The left ventricle has no regional wall motion abnormalities. Left ventricular diastolic parameters were normal.  2. Right ventricular systolic function is normal. The right ventricular size is mildly enlarged.  3. The mitral valve was not well visualized. Mild mitral valve regurgitation.  4. The aortic valve is calcified. Aortic valve regurgitation is trivial.  5. Aortic dilatation noted. FINDINGS  Left Ventricle: Left ventricular ejection fraction, by estimation, is 50 to 55%. The left ventricle has low normal function. The left ventricle has no regional wall motion abnormalities. Definity contrast agent was given IV to delineate the left ventricular endocardial borders. The left ventricular internal cavity size was normal in size. There is borderline left ventricular hypertrophy. Left ventricular diastolic parameters were normal. Right Ventricle: The right ventricular size is mildly enlarged. No increase in right ventricular wall thickness. Right ventricular systolic function is normal. Left Atrium: Left atrial size was normal in size. Right Atrium: Right atrial size was normal in size. Pericardium: There is no evidence of pericardial effusion. Mitral Valve: The mitral valve was not well visualized. Mild mitral valve regurgitation. MV peak gradient, 2.5 mmHg. The mean mitral valve gradient is 1.0 mmHg. Tricuspid Valve: The tricuspid valve is not well visualized. Tricuspid valve regurgitation is  trivial. Aortic Valve: The aortic valve is calcified. Aortic valve regurgitation is trivial. Aortic valve mean gradient measures 7.0 mmHg. Aortic valve peak gradient measures 18.0 mmHg. Aortic valve area, by VTI measures 2.22 cm. Pulmonic Valve: The pulmonic valve was not well visualized. Pulmonic valve regurgitation is trivial. Aorta: Aortic dilatation noted. IAS/Shunts: The interatrial septum was not well visualized.  LEFT VENTRICLE PLAX 2D LVIDd:         5.40 cm  Diastology LVIDs:         3.90 cm  LV e' medial:    2.94 cm/s LV PW:         1.30 cm  LV E/e' medial:  46.0 LV IVS:        1.20 cm  LV e' lateral:   9.57 cm/s LVOT diam:     2.30 cm  LV E/e' lateral: 14.1 LV SV:         88 LV SV Index:   48 LVOT Area:     4.15 cm  LEFT ATRIUM             Index LA diam:        5.00 cm 2.71 cm/m LA Vol (A2C):   41.4 ml 22.47 ml/m LA Vol (A4C):   40.6 ml 22.03 ml/m LA Biplane Vol: 42.0 ml 22.79 ml/m  AORTIC VALVE                    PULMONIC VALVE AV Area (Vmax):    1.59 cm     PV Vmax:       1.19 m/s AV Area (Vmean):   1.89 cm     PV Vmean:      72.200 cm/s AV Area (VTI):     2.22 cm     PV VTI:        0.181 m AV Vmax:           212.00 cm/s  PV Peak grad:  5.7 mmHg AV Vmean:          122.000 cm/s PV Mean grad:  2.0 mmHg AV VTI:  0.396 m AV Peak Grad:      18.0 mmHg AV Mean Grad:      7.0 mmHg LVOT Vmax:         81.10 cm/s LVOT Vmean:        55.500 cm/s LVOT VTI:          0.212 m LVOT/AV VTI ratio: 0.54  AORTA Ao Root diam: 3.60 cm MITRAL VALVE MV Area (PHT): 5.21 cm     SHUNTS MV Area VTI:   3.10 cm     Systemic VTI:  0.21 m MV Peak grad:  2.5 mmHg     Systemic Diam: 2.30 cm MV Mean grad:  1.0 mmHg MV Vmax:       0.79 m/s MV Vmean:      50.2 cm/s MV Decel Time: 146 msec MV E velocity: 135.33 cm/s MV A velocity: 44.10 cm/s MV E/A ratio:  3.07 Bartholome Bill MD Electronically signed by Bartholome Bill MD Signature Date/Time: 11/15/2020/4:35:55 PM    Final      Subjective: Eager to go home  today  Discharge Exam: Vitals:   11/19/20 0747 11/19/20 1150  BP: (!) 163/46 (!) 136/57  Pulse: (!) 52 60  Resp: 18 17  Temp: 98.5 F (36.9 C) 98.2 F (36.8 C)  SpO2: 96% 99%   Vitals:   11/18/20 2144 11/19/20 0505 11/19/20 0747 11/19/20 1150  BP: (!) 158/48 (!) 154/56 (!) 163/46 (!) 136/57  Pulse: (!) 55 (!) 50 (!) 52 60  Resp: 20 16 18 17   Temp: 97.6 F (36.4 C) 98 F (36.7 C) 98.5 F (36.9 C) 98.2 F (36.8 C)  TempSrc: Oral Oral Oral Oral  SpO2: 100% 100% 96% 99%  Weight:  73.9 kg    Height:        General: Pt is alert, awake, not in acute distress Cardiovascular: RRR, S1/S2 +, no rubs, no gallops Respiratory: CTA bilaterally, no wheezing, no rhonchi Abdominal: Soft, NT, ND, bowel sounds + Extremities: no edema, no cyanosis   The results of significant diagnostics from this hospitalization (including imaging, microbiology, ancillary and laboratory) are listed below for reference.     Microbiology: Recent Results (from the past 240 hour(s))  Resp Panel by RT-PCR (Flu A&B, Covid) Nasopharyngeal Swab     Status: None   Collection Time: 11/15/20  3:14 AM   Specimen: Nasopharyngeal Swab; Nasopharyngeal(NP) swabs in vial transport medium  Result Value Ref Range Status   SARS Coronavirus 2 by RT PCR NEGATIVE NEGATIVE Final    Comment: (NOTE) SARS-CoV-2 target nucleic acids are NOT DETECTED.  The SARS-CoV-2 RNA is generally detectable in upper respiratory specimens during the acute phase of infection. The lowest concentration of SARS-CoV-2 viral copies this assay can detect is 138 copies/mL. A negative result does not preclude SARS-Cov-2 infection and should not be used as the sole basis for treatment or other patient management decisions. A negative result may occur with  improper specimen collection/handling, submission of specimen other than nasopharyngeal swab, presence of viral mutation(s) within the areas targeted by this assay, and inadequate number of  viral copies(<138 copies/mL). A negative result must be combined with clinical observations, patient history, and epidemiological information. The expected result is Negative.  Fact Sheet for Patients:  EntrepreneurPulse.com.au  Fact Sheet for Healthcare Providers:  IncredibleEmployment.be  This test is no t yet approved or cleared by the [place] FDA and  has been authorized for detection and/or diagnosis of SARS-CoV-2 by FDA under an Emergency Use Authorization (EUA).  This EUA will remain  in effect (meaning this test can be used) for the duration of the COVID-19 declaration under Section 564(b)(1) of the Act, 21 U.S.C.section 360bbb-3(b)(1), unless the authorization is terminated  or revoked sooner.       Influenza A by PCR NEGATIVE NEGATIVE Final   Influenza B by PCR NEGATIVE NEGATIVE Final    Comment: (NOTE) The Xpert Xpress SARS-CoV-2/FLU/RSV plus assay is intended as an aid in the diagnosis of influenza from Nasopharyngeal swab specimens and should not be used as a sole basis for treatment. Nasal washings and aspirates are unacceptable for Xpert Xpress SARS-CoV-2/FLU/RSV testing.  Fact Sheet for Patients: EntrepreneurPulse.com.au  Fact Sheet for Healthcare Providers: IncredibleEmployment.be  This test is not yet approved or cleared by the [place] FDA and has been authorized for detection and/or diagnosis of SARS-CoV-2 by FDA under an Emergency Use Authorization (EUA). This EUA will remain in effect (meaning this test can be used) for the duration of the COVID-19 declaration under Section 564(b)(1) of the Act, 21 U.S.C. section 360bbb-3(b)(1), unless the authorization is terminated or revoked.  Performed at [facility], [address] [place], [place] [address]      Labs: BNP (last 3 results) Recent Labs    10/23/20 2315 11/15/20 0150 11/16/20 0900  BNP 1,001.5*  994.6* 7,564.3*   Basic Metabolic Panel: Recent Labs  Lab 11/15/20 0314 11/16/20 0537 11/17/20 0326 11/18/20 0313 11/19/20 0637  NA 132* 138 136 135 136  K 4.2 4.1 4.0 3.9 4.3  CL 97* 99 97* 97* 96*  CO2 25 29 28 28 31   GLUCOSE 241* 175* 144* 106* 118*  BUN 41* 35* 44* 42* 44*  CREATININE 2.29* 1.80* 1.83* 1.68* 1.94*  CALCIUM 8.5* 9.0 8.9 8.9 9.1  MG  --   --  2.3  --   --    Liver Function Tests: Recent Labs  Lab 11/15/20 0314 11/17/20 0326 11/18/20 0313 11/19/20 0637  AST 14* 12* 13* 15  ALT 10 10 10 13   ALKPHOS 92 78 81 86  BILITOT 0.9 0.8 0.7 0.6  PROT 6.3* 6.1* 6.1* 6.6  ALBUMIN 3.1* 3.0* 3.0* 3.3*   Recent Labs  Lab 11/15/20 0314  LIPASE 20   No results for input(s): AMMONIA in the last 168 hours. CBC: Recent Labs  Lab 11/15/20 0150 11/16/20 0900 11/17/20 0326  WBC 10.1 8.1 7.6  NEUTROABS 7.9*  --   --   HGB 11.5* 11.9* 10.8*  HCT 37.2* 38.3* 35.1*  MCV 78.6* 78.6* 79.1*  PLT 262 282 267   Cardiac Enzymes: No results for input(s): CKTOTAL, CKMB, CKMBINDEX, TROPONINI in the last 168 hours. BNP: Invalid input(s): POCBNP CBG: Recent Labs  Lab 11/17/20 1347 11/17/20 1620 11/17/20 2155 11/18/20 0712 11/18/20 2137  GLUCAP 263* 218* 208* 71 286*   D-Dimer No results for input(s): DDIMER in the last 72 hours. Hgb A1c No results for input(s): HGBA1C in the last 72 hours. Lipid Profile No results for input(s): CHOL, HDL, LDLCALC, TRIG, CHOLHDL, LDLDIRECT in the last 72 hours. Thyroid function studies No results for input(s): TSH, T4TOTAL, T3FREE, THYROIDAB in the last 72 hours.  Invalid input(s): FREET3 Anemia work up No results for input(s): VITAMINB12, FOLATE, FERRITIN, TIBC, IRON, RETICCTPCT in the last 72 hours. Urinalysis    Component Value Date/Time   COLORURINE YELLOW (A) 01/15/2016 2115   APPEARANCEUR CLEAR (A) 01/15/2016 2115   APPEARANCEUR Clear 04/28/2014 2346   LABSPEC 1.010 01/15/2016 2115   LABSPEC 1.009 04/28/2014  2346    PHURINE 5.0 01/15/2016 2115   GLUCOSEU NEGATIVE 01/15/2016 2115   GLUCOSEU Negative 04/28/2014 2346   HGBUR 1+ (A) 01/15/2016 2115   BILIRUBINUR NEGATIVE 01/15/2016 2115   BILIRUBINUR Negative 04/28/2014 [place] 01/15/2016 2115   PROTEINUR 100 (A) 01/15/2016 2115   NITRITE NEGATIVE 01/15/2016 2115   LEUKOCYTESUR TRACE (A) 01/15/2016 2115   LEUKOCYTESUR 1+ 04/28/2014 2346   Sepsis Labs Invalid input(s): PROCALCITONIN,  WBC,  LACTICIDVEN Microbiology Recent Results (from the past 240 hour(s))  Resp Panel by RT-PCR (Flu A&B, Covid) Nasopharyngeal Swab     Status: None   Collection Time: 11/15/20  3:14 AM   Specimen: Nasopharyngeal Swab; Nasopharyngeal(NP) swabs in vial transport medium  Result Value Ref Range Status   SARS Coronavirus 2 by RT PCR NEGATIVE NEGATIVE Final    Comment: (NOTE) SARS-CoV-2 target nucleic acids are NOT DETECTED.  The SARS-CoV-2 RNA is generally detectable in upper respiratory specimens during the acute phase of infection. The lowest concentration of SARS-CoV-2 viral copies this assay can detect is 138 copies/mL. A negative result does not preclude SARS-Cov-2 infection and should not be used as the sole basis for treatment or other patient management decisions. A negative result may occur with  improper specimen collection/handling, submission of specimen other than nasopharyngeal swab, presence of viral mutation(s) within the areas targeted by this assay, and inadequate number of viral copies(<138 copies/mL). A negative result must be combined with clinical observations, patient history, and epidemiological information. The expected result is Negative.  Fact Sheet for Patients:  EntrepreneurPulse.com.au  Fact Sheet for Healthcare Providers:  IncredibleEmployment.be  This test is no t yet approved or cleared by the [place] FDA and  has been authorized for detection and/or diagnosis of  SARS-CoV-2 by FDA under an Emergency Use Authorization (EUA). This EUA will remain  in effect (meaning this test can be used) for the duration of the COVID-19 declaration under Section 564(b)(1) of the Act, 21 U.S.C.section 360bbb-3(b)(1), unless the authorization is terminated  or revoked sooner.       Influenza A by PCR NEGATIVE NEGATIVE Final   Influenza B by PCR NEGATIVE NEGATIVE Final    Comment: (NOTE) The Xpert Xpress SARS-CoV-2/FLU/RSV plus assay is intended as an aid in the diagnosis of influenza from Nasopharyngeal swab specimens and should not be used as a sole basis for treatment. Nasal washings and aspirates are unacceptable for Xpert Xpress SARS-CoV-2/FLU/RSV testing.  Fact Sheet for Patients: EntrepreneurPulse.com.au  Fact Sheet for Healthcare Providers: IncredibleEmployment.be  This test is not yet approved or cleared by the [place] FDA and has been authorized for detection and/or diagnosis of SARS-CoV-2 by FDA under an Emergency Use Authorization (EUA). This EUA will remain in effect (meaning this test can be used) for the duration of the COVID-19 declaration under Section 564(b)(1) of the Act, 21 U.S.C. section 360bbb-3(b)(1), unless the authorization is terminated or revoked.  Performed at [facility], [address], [place], [place] [address]    Time spent: 30 min  SIGNED:   Marylu Lund, MD  [facility] 11/19/2020, 2:23 PM  If 7PM-7AM, please contact night-coverage

## 2020-11-19 NOTE — Care Management Important Message (Signed)
Important Message  Patient Details  Name: [name] MRN: [id number] Date of Birth: [birth year]   Medicare Important Message Given:  Yes     Dannette Barbara 11/19/2020, 12:03 PM

## 2020-11-19 NOTE — Progress Notes (Signed)
Mobility Specialist - Progress Note   11/19/20 1400  Mobility  Activity Ambulated in room  Level of Assistance Standby assist, set-up cues, supervision of patient - no hands on  Assistive Device Front wheel walker  Distance Ambulated (ft) 60 ft  Mobility Response Tolerated well  Mobility performed by Mobility specialist  $Mobility charge 1 Mobility    Pre-mobility: 57 HR, 93% SpO2 During mobility: 66 HR, 91% SpO2 Post-mobility: 58 HR, 92% SpO2   Pt was sitting in recliner upon arrival utilizing room air. Pt agreed to session. Pt denied pain, nausea, or fatigue this date. Pt stood from recliner with supervision. Pt ambulated 60' in room with RW. Supervision only. VC to keep RW close distance to body. No LOB noted. Pt denied SOB or dizziness throughout activity. O2 > 90% throughout session. Upon return to recliner pt released urinal output in container. Overall, pt tolerated session well. Pt was left in recliner with all needs in reach and alarm set. Nurse notified.   Kathee Delton Mobility Specialist 11/19/20, 2:31 PM

## 2020-11-19 NOTE — Discharge Instructions (Signed)
Restart your Torsemide tomorrow (11/20/20)

## 2020-11-22 ENCOUNTER — Other Ambulatory Visit
Admission: RE | Admit: 2020-11-22 | Discharge: 2020-11-22 | Disposition: A | Discharge status: Home / Self Care | Payer: PPO | Source: Ambulatory Visit | Attending: Cardiology | Admitting: Cardiology

## 2020-11-22 DIAGNOSIS — I5020 Unspecified systolic (congestive) heart failure: Secondary | ICD-10-CM | POA: Diagnosis not present

## 2020-11-22 LAB — BRAIN NATRIURETIC PEPTIDE: B Natriuretic Peptide: 796.1 pg/mL — ABNORMAL HIGH (ref 0.0–100.0)

## 2020-11-26 DIAGNOSIS — I482 Chronic atrial fibrillation, unspecified: Secondary | ICD-10-CM | POA: Diagnosis not present

## 2020-11-26 DIAGNOSIS — Z9181 History of falling: Secondary | ICD-10-CM | POA: Diagnosis not present

## 2020-11-26 DIAGNOSIS — I110 Hypertensive heart disease with heart failure: Secondary | ICD-10-CM | POA: Diagnosis not present

## 2020-11-26 DIAGNOSIS — E1151 Type 2 diabetes mellitus with diabetic peripheral angiopathy without gangrene: Secondary | ICD-10-CM | POA: Diagnosis not present

## 2020-11-26 DIAGNOSIS — E1140 Type 2 diabetes mellitus with diabetic neuropathy, unspecified: Secondary | ICD-10-CM | POA: Diagnosis not present

## 2020-11-26 DIAGNOSIS — E785 Hyperlipidemia, unspecified: Secondary | ICD-10-CM | POA: Diagnosis not present

## 2020-11-26 DIAGNOSIS — J9601 Acute respiratory failure with hypoxia: Secondary | ICD-10-CM | POA: Diagnosis not present

## 2020-11-26 DIAGNOSIS — Z794 Long term (current) use of insulin: Secondary | ICD-10-CM | POA: Diagnosis not present

## 2020-11-26 DIAGNOSIS — Z7901 Long term (current) use of anticoagulants: Secondary | ICD-10-CM | POA: Diagnosis not present

## 2020-11-26 DIAGNOSIS — J449 Chronic obstructive pulmonary disease, unspecified: Secondary | ICD-10-CM | POA: Diagnosis not present

## 2020-11-26 DIAGNOSIS — E1142 Type 2 diabetes mellitus with diabetic polyneuropathy: Secondary | ICD-10-CM | POA: Diagnosis not present

## 2020-11-26 DIAGNOSIS — I5040 Unspecified combined systolic (congestive) and diastolic (congestive) heart failure: Secondary | ICD-10-CM | POA: Diagnosis not present

## 2020-11-26 DIAGNOSIS — B351 Tinea unguium: Secondary | ICD-10-CM | POA: Diagnosis not present

## 2020-11-26 DIAGNOSIS — I2510 Atherosclerotic heart disease of native coronary artery without angina pectoris: Secondary | ICD-10-CM | POA: Diagnosis not present

## 2020-11-27 DIAGNOSIS — I5043 Acute on chronic combined systolic (congestive) and diastolic (congestive) heart failure: Secondary | ICD-10-CM | POA: Diagnosis not present

## 2020-11-27 DIAGNOSIS — E1122 Type 2 diabetes mellitus with diabetic chronic kidney disease: Secondary | ICD-10-CM | POA: Diagnosis not present

## 2020-11-27 DIAGNOSIS — I130 Hypertensive heart and chronic kidney disease with heart failure and stage 1 through stage 4 chronic kidney disease, or unspecified chronic kidney disease: Secondary | ICD-10-CM | POA: Diagnosis not present

## 2020-11-27 DIAGNOSIS — E1165 Type 2 diabetes mellitus with hyperglycemia: Secondary | ICD-10-CM | POA: Diagnosis not present

## 2020-11-27 DIAGNOSIS — N1832 Chronic kidney disease, stage 3b: Secondary | ICD-10-CM | POA: Diagnosis not present

## 2020-11-27 DIAGNOSIS — J441 Chronic obstructive pulmonary disease with (acute) exacerbation: Secondary | ICD-10-CM | POA: Diagnosis not present

## 2020-11-27 DIAGNOSIS — I4891 Unspecified atrial fibrillation: Secondary | ICD-10-CM | POA: Diagnosis not present

## 2020-11-27 DIAGNOSIS — Z794 Long term (current) use of insulin: Secondary | ICD-10-CM | POA: Diagnosis not present

## 2020-11-27 DIAGNOSIS — N184 Chronic kidney disease, stage 4 (severe): Secondary | ICD-10-CM | POA: Diagnosis not present

## 2020-11-27 DIAGNOSIS — I10 Essential (primary) hypertension: Secondary | ICD-10-CM | POA: Diagnosis not present

## 2020-11-27 DIAGNOSIS — J9601 Acute respiratory failure with hypoxia: Secondary | ICD-10-CM | POA: Diagnosis not present

## 2020-11-27 DIAGNOSIS — I5023 Acute on chronic systolic (congestive) heart failure: Secondary | ICD-10-CM | POA: Diagnosis not present

## 2020-11-28 ENCOUNTER — Encounter: Payer: Self-pay | Admitting: Dermatology

## 2020-11-28 ENCOUNTER — Ambulatory Visit: Payer: PPO | Admitting: Dermatology

## 2020-11-28 ENCOUNTER — Other Ambulatory Visit: Payer: Self-pay

## 2020-11-28 DIAGNOSIS — Q800 Ichthyosis vulgaris: Secondary | ICD-10-CM

## 2020-11-28 DIAGNOSIS — L570 Actinic keratosis: Secondary | ICD-10-CM

## 2020-11-28 DIAGNOSIS — Z85828 Personal history of other malignant neoplasm of skin: Secondary | ICD-10-CM | POA: Diagnosis not present

## 2020-11-28 DIAGNOSIS — L853 Xerosis cutis: Secondary | ICD-10-CM | POA: Diagnosis not present

## 2020-11-28 DIAGNOSIS — E119 Type 2 diabetes mellitus without complications: Secondary | ICD-10-CM | POA: Diagnosis not present

## 2020-11-28 DIAGNOSIS — B358 Other dermatophytoses: Secondary | ICD-10-CM | POA: Diagnosis not present

## 2020-11-28 NOTE — Progress Notes (Signed)
   Follow-Up Visit   Subjective  [name] is a 75 y.o. male who presents for the following: Follow-up (AK follow-up of the face, arms. He also has xerosis of the feet, severe with ichthyosis and tinea pedis/unguium, patient is diabetic. He has only used ketoconazole cream 1-2 times and has only soaked his feet a few times, but not recently.  ). He has been hospitalized for fluid on his lungs and hasn't been able to treat his feet.   The following portions of the chart were reviewed this encounter and updated as appropriate:       Review of Systems:  No other skin or systemic complaints except as noted in HPI or Assessment and Plan.  Objective  Well appearing patient in no apparent distress; mood and affect are within normal limits.  A focused examination was performed including face, arms, feet. Relevant physical exam findings are noted in the Assessment and Plan.  Objective  bil feet: Erythema with edema feet, diffuse ichthyotic exfoliative scale, nail dystrophy of the toenails  Objective  R forearm x 1, L forearm x 3, inferior vertex scalp x 1 (5): Keratotic papules.  Objective  Right nasal tip x 1: Pink scaly macule.    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, patient is diabetic, pt hasn't been treating due to recent hospitalization  start ketoconazole cream - apply to feet bid Apply Amlactin 12% cream bid to feet  Epson salt soaks to feet qd then apply creams  Hypertrophic actinic keratosis (5) R forearm x 1, L forearm x 3, inferior vertex scalp x 1  Improving with cryotherapy  Destruction of lesion - R forearm x 1, L forearm x 3, inferior vertex scalp x 1  Destruction method: cryotherapy   Informed consent: discussed and consent obtained   Lesion destroyed using liquid nitrogen: Yes   Region frozen until ice ball extended beyond lesion: Yes   Outcome: patient tolerated procedure well with no complications    Post-procedure details: wound care instructions given    AK (actinic keratosis) Right nasal tip x 1  Destruction of lesion - Right nasal tip x 1  Destruction method: cryotherapy   Informed consent: discussed and consent obtained   Lesion destroyed using liquid nitrogen: Yes   Region frozen until ice ball extended beyond lesion: Yes   Outcome: patient tolerated procedure well with no complications   Post-procedure details: wound care instructions given    History of Basal Cell Carcinoma of the Skin - No evidence of recurrence today - Recommend regular full body skin exams - Recommend daily broad spectrum sunscreen SPF 30+ to sun-exposed areas, reapply every 2 hours as needed.  - Call if any new or changing lesions are noted between office visits  History of Squamous Cell Carcinoma of the Skin - No evidence of recurrence today - Recommend regular full body skin exams - Recommend daily broad spectrum sunscreen SPF 30+ to sun-exposed areas, reapply every 2 hours as needed.  - Call if any new or changing lesions are noted between office visits  Return in about 3 months (around 02/26/2021) for xerosis of the feet, AKs.   IJamesetta Orleans, CMA, am acting as scribe for Brendolyn Patty, MD .  Documentation: I have reviewed the above documentation for accuracy and completeness, and I agree with the above.  Brendolyn Patty MD

## 2020-11-28 NOTE — Patient Instructions (Addendum)
Cryotherapy Aftercare  . Wash gently with soap and water everyday.   Marland Kitchen Apply Vaseline and Band-Aid daily until healed.   Ketoconazole cream - apply to feet 1-2 times a day x 4 weeks. AmLactin Cream - Apply to feet, legs, arms 1-2 times daily.  Soak feet in warm water and epsom salt daily for 20 minutes then gently rub off dead skin with washcloth after soaking to help exfoliate.

## 2020-11-30 NOTE — Progress Notes (Signed)
Patient ID: [name], male    DOB: 28-Apr-1946, 75 y.o.   MRN: [id number]  HPI  Mr Thebeau is a 75 y/o male with a history of CAD, DM, hyperlipidemia, HTN, stroke, COPD, GERD, PVD, atrial fibrillation, current tobacco use and chronic heart failure.   Echo report from 11/15/20 reviewed and showed an EF of 50-55% along with mild MR. Echo report from 12/26/19 reviewed and showed an EF of 35-40% along with mild MR and mild mildly elevated PA pressure.   Admitted 11/15/20 due to acute shortness of breath along with cough and wheezing. Initially required bipap but then weaned off. Initially given IV lasix with transition to oral diuretics. PT consult obtained due to weakness. Discharged after 4 days. Admitted 10/23/20 due to shortness of breath. Initially needed bipap and supplemental oxygen. Cardiology consult obtained. Given IV lasix with transition to oral diuretics. Weaned off of oxygen. Discharged after 5 days. Was in the ED 06/02/20 due to HF exacerbation where he was treated with IV lasix and released.   He presents today for a follow-up visit with a chief complaint of minimal shortness of breath upon moderate exertion. He describes this as chronic in nature having been present for several years although is better since his most admission. He has associated fatigue, nasal congestion and easy bruising along with this. He denies any difficulty sleeping, dizziness, abdominal distention, palpitations, pedal edema, chest pain, cough or weight gain.      Says that since his recent admission, he's been reading food labels closely and not adding any salt to his foods. Used to like eating salisbury steak frozen dinner and says that it had >2000mg  sodium in it. He also says that he's keeping his daily fluid intake to 64 ounces/ day.      Past Medical History:  Diagnosis Date  . Arrhythmia    atrial fibrillation  . Atrial fibrillation ([place])   . Basal cell carcinoma 05/2019   right nasal ala, Tx: EDC  . Basal  cell carcinoma 04/12/2019   Left posterior ear. Nodular pattern, excoriated.   . CHF (congestive heart failure) ([place])   . COPD (chronic obstructive pulmonary disease) ([place])   . Coronary artery disease   . Diabetes mellitus without complication ([place])   . GERD (gastroesophageal reflux disease)   . HLD (hyperlipidemia)   . Hypertension   . Peripheral vascular disease ([place])   . Pneumonia   . Prostate cancer ([place])   . Squamous cell carcinoma of skin 08/10/2018   Left upper arm above elbow. [facility] SCC.  Marland Kitchen Squamous cell carcinoma of skin 09/07/2018   Right forearm, below elbow. WD SCC. [facility] 01/10/2019.  Marland Kitchen Stroke [facility])    Past Surgical History:  Procedure Laterality Date  . COLONOSCOPY    . CORONARY ARTERY BYPASS GRAFT  2006  . EYE SURGERY    . GOLD SEED IMPLANT N/A 12/30/2016   Procedure: GOLD SEED IMPLANT  x3;  Surgeon: Hollice Espy, MD;  Location: [facility] ORS;  Service: Urology;  Laterality: N/A;  . HEMORRHOID SURGERY N/A 10/02/2016   Procedure: PROCTOSCOPY AND CONTROL OF RECTAL BLEEDING;  Surgeon: Excell Seltzer, MD;  Location: [facility] ORS;  Service: General;  Laterality: N/A;  . PROSTATE BIOPSY    . TONSILLECTOMY     Family History  Problem Relation Age of Onset  . Lung cancer Mother   . Heart attack Father   . Hypertension Other   . Cancer Maternal Grandfather        prostate  .  Cancer Paternal Grandmother        breast   Social History   Tobacco Use  . Smoking status: Current Every Day Smoker    Packs/day: 1.00    Years: 54.00    Pack years: 54.00    Types: Cigarettes  . Smokeless tobacco: Never Used  Substance Use Topics  . Alcohol use: No    Alcohol/week: 0.0 standard drinks   Allergies  Allergen Reactions  . Lactose Intolerance (Gi)     Past Medical History:  Diagnosis Date  . Arrhythmia    atrial fibrillation  . Atrial fibrillation ([place])   . Basal cell carcinoma 05/2019   right nasal ala, Tx: EDC  . Basal cell carcinoma 04/12/2019   Left posterior ear.  Nodular pattern, excoriated.   . CHF (congestive heart failure) ([place])   . COPD (chronic obstructive pulmonary disease) ([place])   . Coronary artery disease   . Diabetes mellitus without complication ([place])   . GERD (gastroesophageal reflux disease)   . HLD (hyperlipidemia)   . Hypertension   . Peripheral vascular disease ([place])   . Pneumonia   . Prostate cancer ([place])   . Squamous cell carcinoma of skin 08/10/2018   Left upper arm above elbow. [facility] SCC.  Marland Kitchen Squamous cell carcinoma of skin 09/07/2018   Right forearm, below elbow. WD SCC. [facility] 01/10/2019.  Marland Kitchen Stroke [facility])    Past Surgical History:  Procedure Laterality Date  . COLONOSCOPY    . CORONARY ARTERY BYPASS GRAFT  2006  . EYE SURGERY    . GOLD SEED IMPLANT N/A 12/30/2016   Procedure: GOLD SEED IMPLANT  x3;  Surgeon: Hollice Espy, MD;  Location: [facility] ORS;  Service: Urology;  Laterality: N/A;  . HEMORRHOID SURGERY N/A 10/02/2016   Procedure: PROCTOSCOPY AND CONTROL OF RECTAL BLEEDING;  Surgeon: Excell Seltzer, MD;  Location: [facility]S;  Service: General;  Laterality: N/A;  . PROSTATE BIOPSY    . TONSILLECTOMY     Family History  Problem Relation Age of Onset  . Lung cancer Mother   . Heart attack Father   . Hypertension Other   . Cancer Maternal Grandfather        prostate  . Cancer Paternal Grandmother        breast   Social History   Tobacco Use  . Smoking status: Current Every Day Smoker    Packs/day: 1.00    Years: 54.00    Pack years: 54.00    Types: Cigarettes  . Smokeless tobacco: Never Used  Substance Use Topics  . Alcohol use: No    Alcohol/week: 0.0 standard drinks   Allergies  Allergen Reactions  . Lactose Intolerance ([place])    Prior to Admission medications   Medication Sig Start Date End Date Taking? Authorizing Provider  acetaminophen (TYLENOL) 325 MG tablet Take 2 tablets (650 mg total) by mouth every 6 (six) hours as needed for mild pain (or Fever >/= 101). 01/20/16  Yes Gouru, Illene Silver, MD  albuterol  (PROVENTIL HFA;VENTOLIN HFA) 108 (90 Base) MCG/ACT inhaler Inhale 2 puffs into the lungs every 6 (six) hours as needed for wheezing or shortness of breath.   Yes [provider]  amLODipine (NORVASC) 5 MG tablet Take 1 tablet (5 mg total) by mouth daily. 12/30/19  Yes Ezekiel Slocumb, DO  apixaban (ELIQUIS) 5 MG TABS tablet Take 1 tablet (5 mg total) by mouth 2 (two) times daily. 03/12/20  Yes Darylene Price A, FNP  bifidobacterium infantis (ALIGN) capsule Take  1 capsule by mouth every morning.   Yes [provider]  cholecalciferol (VITAMIN D) 1000 units tablet Take 1,000 Units by mouth daily.   Yes [provider]  Cinnamon 500 MG capsule Take 1,000 mg by mouth 2 (two) times daily.   Yes [provider]  COMBIVENT RESPIMAT 20-100 MCG/ACT AERS respimat Inhale 2 puffs into the lungs 4 (four) times daily as needed. 09/18/20  Yes [provider]  CRANBERRY EXTRACT PO Take 500 mg by mouth every morning.    Yes [provider]  Ensure Max Protein (ENSURE MAX PROTEIN) LIQD Take 330 mLs (11 oz total) by mouth 2 (two) times daily between meals. 12/29/19  Yes Nicole Kindred A, DO  gabapentin (NEURONTIN) 100 MG capsule Take 100 mg by mouth 2 (two) times daily.   Yes [provider]  glucose 4 GM chewable tablet Chew 1 tablet by mouth as needed for low blood sugar.   Yes [provider]  hydrALAZINE (APRESOLINE) 50 MG tablet Take 1 tablet (50 mg total) by mouth every 8 (eight) hours. 10/15/19  Yes Ghimire, Henreitta Leber, MD  insulin aspart (NOVOLOG) 100 UNIT/ML injection 0-9 Units, Subcutaneous, 3 times daily with meals CBG < 70: Implement Hypoglycemia protocol/measures CBG 70 - 120: 0 units CBG 121 - 150: 1 unit CBG 151 - 200: 2 units CBG 201 - 250: 3 units CBG 251 - 300: 5 units CBG 301 - 350: 7 units CBG 351 - 400: 9 units CBG > 400: call MD Patient taking differently: Inject 5 Units into the skin 3 (three) times daily with meals.  10/15/19  Yes Ghimire, Henreitta Leber, MD  isosorbide mononitrate (IMDUR) 30 MG 24 hr tablet Take 1 tablet (30 mg total) by mouth daily. 10/15/19  Yes Ghimire, Henreitta Leber, MD  lactase (LACTAID) 3000 units tablet Take 3,000 Units by mouth 3 (three) times daily between meals as needed.   Yes [provider]  LANTUS SOLOSTAR 100 UNIT/ML Solostar Pen Inject 45 Units into the skin daily at 10 pm. 10/15/19  Yes Ghimire, Henreitta Leber, MD  Magnesium 250 MG TABS Take 250 mg by mouth daily.   Yes [provider]  Misc Natural Products (TOTAL MEMORY & FOCUS FORMULA PO) Take 1 capsule by mouth See admin instructions. Take 2 capsule at 9:00 AM, 1 capsule at 5:00 PM, and 1 capsule at bedtime.   Yes [provider]  Multiple Vitamin (MULTIVITAMIN WITH MINERALS) TABS tablet Take 1 tablet by mouth daily.   Yes [provider]  NON FORMULARY Take 1 Dose by mouth 3 (three) times daily. Relief Factor OTC   Yes [provider]  omeprazole (PRILOSEC) 20 MG capsule Take 20 mg by mouth daily.   Yes [provider]  potassium chloride (K-DUR) 10 MEQ tablet Take 10 mEq by mouth daily.   Yes [provider]  simvastatin (ZOCOR) 20 MG tablet Take 20 mg by mouth at bedtime.   Yes [provider]  SUPER B COMPLEX/C PO Take 1 tablet by mouth every morning.   Yes [provider]  torsemide (DEMADEX) 20 MG tablet Take 2 tablets (40 mg total) by mouth daily. 10/28/20 11/27/20 Yes Sreenath, Sudheer B, MD  traZODone (DESYREL) 50 MG tablet Take 50 mg by mouth at bedtime.   Yes [provider]  vitamin C (ASCORBIC ACID) 500 MG tablet Take 500 mg by mouth 2 (two) times daily.    Yes [provider]    Review of Systems  Constitutional: Positive for fatigue. Negative for appetite change.  HENT: Positive for congestion. Negative for postnasal drip and sore throat.   Eyes: Negative.   Respiratory: Positive for shortness of breath (better). Negative  for cough and chest tightness.   Cardiovascular: Negative for chest pain, palpitations and leg swelling.  Gastrointestinal: Negative for abdominal distention and abdominal pain.  Endocrine: Negative.   Genitourinary: Negative.   Musculoskeletal: Negative for back pain and neck pain.  Skin: Negative.   Allergic/Immunologic: Negative.   Neurological: Negative.  Negative for dizziness and light-headedness.  Hematological: Negative for adenopathy. Bruises/bleeds easily.  Psychiatric/Behavioral: Negative for dysphoric mood and sleep disturbance (sleeping on 1 pillow). The patient is not nervous/anxious.    Vitals:   12/03/20 1021  BP: 117/73  Pulse: 61  Resp: 18  SpO2: 92%  Weight: 161 lb 8 oz (73.3 kg)  Height: 5\' 6"  (1.676 m)   Wt Readings from Last 3 Encounters:  12/03/20 161 lb 8 oz (73.3 kg)  11/19/20 162 lb 13.7 oz (73.9 kg)  11/07/20 164 lb (74.4 kg)   Lab Results  Component Value Date   CREATININE 1.94 (H) 11/19/2020   CREATININE 1.68 (H) 11/18/2020   CREATININE 1.83 (H) 11/17/2020   Physical Exam Vitals and nursing note reviewed. Exam conducted with a chaperone present (wife).  Constitutional:      Appearance: Normal appearance.  HENT:     Head: Normocephalic and atraumatic.  Cardiovascular:     Rate and Rhythm: Normal rate. Rhythm irregular.  Pulmonary:     Effort: Pulmonary effort is normal.     Breath sounds: No wheezing or rales.  Abdominal:     General: Abdomen is flat. There is no distension.     Palpations: Abdomen is soft.  Musculoskeletal:        General: No tenderness.     Cervical back: Normal range of motion and neck supple.     Right lower leg: No edema.     Left lower leg: No tenderness. Edema (trace pitting) present.  Skin:    General: Skin is warm and dry.  Neurological:     General: No focal deficit present.     Mental Status: He is alert and oriented to person, place, and time.  Psychiatric:        Mood and Affect: Mood normal.         Behavior: Behavior normal.        Thought Content: Thought content normal.    Assessment & Plan:  1: Chronic heart failure with now preserved ejection fraction without structural changes- - NYHA class II - euvolemic today - weighing daily; reminded to call for an overnight weight gain of >2 pounds or a weekly weight gain of >5 pounds - weight down 3 pounds from last visit here 1 month ago - has not been adding salt since his recent admission and has been closely reading food labels for sodium content; no longer eating salisbury steak frozen dinners - reports keeping his daily fluid intake to 64 ounces/ day - saw cardiology (Fath) 11/22/20 & returns February 2022 - BNP 10/23/20 was 1001.5 - currently not planning to get COVID vaccines - reports receiving his flu vaccine for this season  2: HTN- - BP looks good today - saw PCP Baldemar Lenis) 11/27/20 - BMP 11/22/20 reviewed and showed sodium 139, potassium 5.0, creatinine 2.0 and GFR 33  3: DM- - A1c 10/24/20 was 7.6% - glucose at home this morning was 160  4: Tobacco use- -  smokes ~ 1 ppd of cigarettes - doesn't have a huge desire to quit smoking - complete cessation discussed for 3 minutes with him   Medication bottles reviewed  Return in 2 months or sooner for any questions/problems before then.

## 2020-12-03 ENCOUNTER — Ambulatory Visit: Payer: PPO | Attending: Family | Admitting: Family

## 2020-12-03 ENCOUNTER — Other Ambulatory Visit: Payer: Self-pay

## 2020-12-03 ENCOUNTER — Encounter: Payer: Self-pay | Admitting: Family

## 2020-12-03 VITALS — BP 117/73 | HR 61 | Resp 18 | Ht 66.0 in | Wt 161.5 lb

## 2020-12-03 DIAGNOSIS — Z7901 Long term (current) use of anticoagulants: Secondary | ICD-10-CM | POA: Insufficient documentation

## 2020-12-03 DIAGNOSIS — Z8673 Personal history of transient ischemic attack (TIA), and cerebral infarction without residual deficits: Secondary | ICD-10-CM | POA: Diagnosis not present

## 2020-12-03 DIAGNOSIS — R0602 Shortness of breath: Secondary | ICD-10-CM | POA: Insufficient documentation

## 2020-12-03 DIAGNOSIS — I2510 Atherosclerotic heart disease of native coronary artery without angina pectoris: Secondary | ICD-10-CM | POA: Insufficient documentation

## 2020-12-03 DIAGNOSIS — Z79899 Other long term (current) drug therapy: Secondary | ICD-10-CM | POA: Insufficient documentation

## 2020-12-03 DIAGNOSIS — K219 Gastro-esophageal reflux disease without esophagitis: Secondary | ICD-10-CM | POA: Diagnosis not present

## 2020-12-03 DIAGNOSIS — I509 Heart failure, unspecified: Secondary | ICD-10-CM | POA: Insufficient documentation

## 2020-12-03 DIAGNOSIS — Z794 Long term (current) use of insulin: Secondary | ICD-10-CM | POA: Insufficient documentation

## 2020-12-03 DIAGNOSIS — R0981 Nasal congestion: Secondary | ICD-10-CM | POA: Diagnosis not present

## 2020-12-03 DIAGNOSIS — E785 Hyperlipidemia, unspecified: Secondary | ICD-10-CM | POA: Insufficient documentation

## 2020-12-03 DIAGNOSIS — E1151 Type 2 diabetes mellitus with diabetic peripheral angiopathy without gangrene: Secondary | ICD-10-CM | POA: Insufficient documentation

## 2020-12-03 DIAGNOSIS — J449 Chronic obstructive pulmonary disease, unspecified: Secondary | ICD-10-CM | POA: Insufficient documentation

## 2020-12-03 DIAGNOSIS — Z8249 Family history of ischemic heart disease and other diseases of the circulatory system: Secondary | ICD-10-CM | POA: Insufficient documentation

## 2020-12-03 DIAGNOSIS — Z720 Tobacco use: Secondary | ICD-10-CM

## 2020-12-03 DIAGNOSIS — I110 Hypertensive heart disease with heart failure: Secondary | ICD-10-CM | POA: Insufficient documentation

## 2020-12-03 DIAGNOSIS — F17210 Nicotine dependence, cigarettes, uncomplicated: Secondary | ICD-10-CM | POA: Insufficient documentation

## 2020-12-03 DIAGNOSIS — I4891 Unspecified atrial fibrillation: Secondary | ICD-10-CM | POA: Diagnosis not present

## 2020-12-03 DIAGNOSIS — I10 Essential (primary) hypertension: Secondary | ICD-10-CM

## 2020-12-03 DIAGNOSIS — E1122 Type 2 diabetes mellitus with diabetic chronic kidney disease: Secondary | ICD-10-CM

## 2020-12-03 DIAGNOSIS — I5032 Chronic diastolic (congestive) heart failure: Secondary | ICD-10-CM

## 2020-12-03 NOTE — Patient Instructions (Signed)
Continue weighing daily and call for an overnight weight gain of > 2 pounds or a weekly weight gain of >5 pounds. 

## 2020-12-05 DIAGNOSIS — Z7901 Long term (current) use of anticoagulants: Secondary | ICD-10-CM | POA: Diagnosis not present

## 2020-12-05 DIAGNOSIS — E1142 Type 2 diabetes mellitus with diabetic polyneuropathy: Secondary | ICD-10-CM | POA: Diagnosis not present

## 2020-12-05 DIAGNOSIS — I482 Chronic atrial fibrillation, unspecified: Secondary | ICD-10-CM | POA: Diagnosis not present

## 2020-12-05 DIAGNOSIS — E785 Hyperlipidemia, unspecified: Secondary | ICD-10-CM | POA: Diagnosis not present

## 2020-12-05 DIAGNOSIS — J9601 Acute respiratory failure with hypoxia: Secondary | ICD-10-CM | POA: Diagnosis not present

## 2020-12-05 DIAGNOSIS — J449 Chronic obstructive pulmonary disease, unspecified: Secondary | ICD-10-CM | POA: Diagnosis not present

## 2020-12-05 DIAGNOSIS — I5040 Unspecified combined systolic (congestive) and diastolic (congestive) heart failure: Secondary | ICD-10-CM | POA: Diagnosis not present

## 2020-12-05 DIAGNOSIS — Z794 Long term (current) use of insulin: Secondary | ICD-10-CM | POA: Diagnosis not present

## 2020-12-05 DIAGNOSIS — I2510 Atherosclerotic heart disease of native coronary artery without angina pectoris: Secondary | ICD-10-CM | POA: Diagnosis not present

## 2020-12-05 DIAGNOSIS — Z9181 History of falling: Secondary | ICD-10-CM | POA: Diagnosis not present

## 2020-12-05 DIAGNOSIS — I110 Hypertensive heart disease with heart failure: Secondary | ICD-10-CM | POA: Diagnosis not present

## 2020-12-05 DIAGNOSIS — E1151 Type 2 diabetes mellitus with diabetic peripheral angiopathy without gangrene: Secondary | ICD-10-CM | POA: Diagnosis not present

## 2020-12-13 DIAGNOSIS — I2510 Atherosclerotic heart disease of native coronary artery without angina pectoris: Secondary | ICD-10-CM | POA: Diagnosis not present

## 2020-12-13 DIAGNOSIS — E1151 Type 2 diabetes mellitus with diabetic peripheral angiopathy without gangrene: Secondary | ICD-10-CM | POA: Diagnosis not present

## 2020-12-13 DIAGNOSIS — E785 Hyperlipidemia, unspecified: Secondary | ICD-10-CM | POA: Diagnosis not present

## 2020-12-13 DIAGNOSIS — I5040 Unspecified combined systolic (congestive) and diastolic (congestive) heart failure: Secondary | ICD-10-CM | POA: Diagnosis not present

## 2020-12-13 DIAGNOSIS — I110 Hypertensive heart disease with heart failure: Secondary | ICD-10-CM | POA: Diagnosis not present

## 2020-12-13 DIAGNOSIS — J449 Chronic obstructive pulmonary disease, unspecified: Secondary | ICD-10-CM | POA: Diagnosis not present

## 2020-12-13 DIAGNOSIS — I482 Chronic atrial fibrillation, unspecified: Secondary | ICD-10-CM | POA: Diagnosis not present

## 2020-12-14 DIAGNOSIS — E1142 Type 2 diabetes mellitus with diabetic polyneuropathy: Secondary | ICD-10-CM | POA: Diagnosis not present

## 2020-12-14 DIAGNOSIS — N1832 Chronic kidney disease, stage 3b: Secondary | ICD-10-CM | POA: Diagnosis not present

## 2020-12-14 DIAGNOSIS — E785 Hyperlipidemia, unspecified: Secondary | ICD-10-CM | POA: Diagnosis not present

## 2020-12-14 DIAGNOSIS — E1169 Type 2 diabetes mellitus with other specified complication: Secondary | ICD-10-CM | POA: Diagnosis not present

## 2020-12-14 DIAGNOSIS — Z794 Long term (current) use of insulin: Secondary | ICD-10-CM | POA: Diagnosis not present

## 2020-12-19 DIAGNOSIS — Z720 Tobacco use: Secondary | ICD-10-CM | POA: Diagnosis not present

## 2020-12-19 DIAGNOSIS — N1832 Chronic kidney disease, stage 3b: Secondary | ICD-10-CM | POA: Diagnosis not present

## 2020-12-19 DIAGNOSIS — E1159 Type 2 diabetes mellitus with other circulatory complications: Secondary | ICD-10-CM | POA: Diagnosis not present

## 2020-12-19 DIAGNOSIS — Z8679 Personal history of other diseases of the circulatory system: Secondary | ICD-10-CM | POA: Diagnosis not present

## 2020-12-19 DIAGNOSIS — I5022 Chronic systolic (congestive) heart failure: Secondary | ICD-10-CM | POA: Diagnosis not present

## 2020-12-19 DIAGNOSIS — E1142 Type 2 diabetes mellitus with diabetic polyneuropathy: Secondary | ICD-10-CM | POA: Diagnosis not present

## 2020-12-19 DIAGNOSIS — E1151 Type 2 diabetes mellitus with diabetic peripheral angiopathy without gangrene: Secondary | ICD-10-CM | POA: Diagnosis not present

## 2020-12-19 DIAGNOSIS — Z794 Long term (current) use of insulin: Secondary | ICD-10-CM | POA: Diagnosis not present

## 2020-12-19 DIAGNOSIS — I110 Hypertensive heart disease with heart failure: Secondary | ICD-10-CM | POA: Diagnosis not present

## 2020-12-19 DIAGNOSIS — I639 Cerebral infarction, unspecified: Secondary | ICD-10-CM | POA: Diagnosis not present

## 2020-12-19 DIAGNOSIS — I350 Nonrheumatic aortic (valve) stenosis: Secondary | ICD-10-CM | POA: Diagnosis not present

## 2020-12-19 DIAGNOSIS — Z7901 Long term (current) use of anticoagulants: Secondary | ICD-10-CM | POA: Diagnosis not present

## 2020-12-19 DIAGNOSIS — I5040 Unspecified combined systolic (congestive) and diastolic (congestive) heart failure: Secondary | ICD-10-CM | POA: Diagnosis not present

## 2020-12-19 DIAGNOSIS — Z9181 History of falling: Secondary | ICD-10-CM | POA: Diagnosis not present

## 2020-12-19 DIAGNOSIS — E785 Hyperlipidemia, unspecified: Secondary | ICD-10-CM | POA: Diagnosis not present

## 2020-12-19 DIAGNOSIS — I2510 Atherosclerotic heart disease of native coronary artery without angina pectoris: Secondary | ICD-10-CM | POA: Diagnosis not present

## 2020-12-19 DIAGNOSIS — E1169 Type 2 diabetes mellitus with other specified complication: Secondary | ICD-10-CM | POA: Diagnosis not present

## 2020-12-19 DIAGNOSIS — I25810 Atherosclerosis of coronary artery bypass graft(s) without angina pectoris: Secondary | ICD-10-CM | POA: Diagnosis not present

## 2020-12-19 DIAGNOSIS — Z951 Presence of aortocoronary bypass graft: Secondary | ICD-10-CM | POA: Diagnosis not present

## 2020-12-19 DIAGNOSIS — E119 Type 2 diabetes mellitus without complications: Secondary | ICD-10-CM | POA: Diagnosis not present

## 2020-12-19 DIAGNOSIS — J449 Chronic obstructive pulmonary disease, unspecified: Secondary | ICD-10-CM | POA: Diagnosis not present

## 2020-12-19 DIAGNOSIS — I482 Chronic atrial fibrillation, unspecified: Secondary | ICD-10-CM | POA: Diagnosis not present

## 2020-12-19 DIAGNOSIS — I480 Paroxysmal atrial fibrillation: Secondary | ICD-10-CM | POA: Diagnosis not present

## 2020-12-19 DIAGNOSIS — J9601 Acute respiratory failure with hypoxia: Secondary | ICD-10-CM | POA: Diagnosis not present

## 2020-12-19 LAB — BLOOD GAS, VENOUS
Acid-Base Excess: 8.2 mmol/L — ABNORMAL HIGH (ref 0.0–2.0)
Bicarbonate: 34.4 mmol/L — ABNORMAL HIGH (ref 20.0–28.0)
FIO2: 70
O2 Saturation: 58.9 %
Patient temperature: 37
pCO2, Ven: 53 mmHg (ref 44.0–60.0)
pH, Ven: 7.42 (ref 7.250–7.430)

## 2020-12-27 DIAGNOSIS — I25118 Atherosclerotic heart disease of native coronary artery with other forms of angina pectoris: Secondary | ICD-10-CM | POA: Diagnosis not present

## 2020-12-27 DIAGNOSIS — E1122 Type 2 diabetes mellitus with diabetic chronic kidney disease: Secondary | ICD-10-CM | POA: Diagnosis not present

## 2020-12-27 DIAGNOSIS — Z6823 Body mass index (BMI) 23.0-23.9, adult: Secondary | ICD-10-CM | POA: Diagnosis not present

## 2020-12-27 DIAGNOSIS — I5042 Chronic combined systolic (congestive) and diastolic (congestive) heart failure: Secondary | ICD-10-CM | POA: Diagnosis not present

## 2020-12-27 DIAGNOSIS — J449 Chronic obstructive pulmonary disease, unspecified: Secondary | ICD-10-CM | POA: Diagnosis not present

## 2020-12-27 DIAGNOSIS — E440 Moderate protein-calorie malnutrition: Secondary | ICD-10-CM | POA: Diagnosis not present

## 2020-12-27 DIAGNOSIS — E1159 Type 2 diabetes mellitus with other circulatory complications: Secondary | ICD-10-CM | POA: Diagnosis not present

## 2020-12-27 DIAGNOSIS — E1165 Type 2 diabetes mellitus with hyperglycemia: Secondary | ICD-10-CM | POA: Diagnosis not present

## 2020-12-27 DIAGNOSIS — N184 Chronic kidney disease, stage 4 (severe): Secondary | ICD-10-CM | POA: Diagnosis not present

## 2020-12-27 DIAGNOSIS — E1151 Type 2 diabetes mellitus with diabetic peripheral angiopathy without gangrene: Secondary | ICD-10-CM | POA: Diagnosis not present

## 2020-12-27 DIAGNOSIS — F17200 Nicotine dependence, unspecified, uncomplicated: Secondary | ICD-10-CM | POA: Diagnosis not present

## 2020-12-27 DIAGNOSIS — D692 Other nonthrombocytopenic purpura: Secondary | ICD-10-CM | POA: Diagnosis not present

## 2021-01-02 DIAGNOSIS — E1151 Type 2 diabetes mellitus with diabetic peripheral angiopathy without gangrene: Secondary | ICD-10-CM | POA: Diagnosis not present

## 2021-01-02 DIAGNOSIS — E785 Hyperlipidemia, unspecified: Secondary | ICD-10-CM | POA: Diagnosis not present

## 2021-01-02 DIAGNOSIS — I482 Chronic atrial fibrillation, unspecified: Secondary | ICD-10-CM | POA: Diagnosis not present

## 2021-01-02 DIAGNOSIS — I110 Hypertensive heart disease with heart failure: Secondary | ICD-10-CM | POA: Diagnosis not present

## 2021-01-02 DIAGNOSIS — I2510 Atherosclerotic heart disease of native coronary artery without angina pectoris: Secondary | ICD-10-CM | POA: Diagnosis not present

## 2021-01-02 DIAGNOSIS — Z9181 History of falling: Secondary | ICD-10-CM | POA: Diagnosis not present

## 2021-01-02 DIAGNOSIS — E1142 Type 2 diabetes mellitus with diabetic polyneuropathy: Secondary | ICD-10-CM | POA: Diagnosis not present

## 2021-01-02 DIAGNOSIS — J9601 Acute respiratory failure with hypoxia: Secondary | ICD-10-CM | POA: Diagnosis not present

## 2021-01-02 DIAGNOSIS — Z794 Long term (current) use of insulin: Secondary | ICD-10-CM | POA: Diagnosis not present

## 2021-01-02 DIAGNOSIS — I5040 Unspecified combined systolic (congestive) and diastolic (congestive) heart failure: Secondary | ICD-10-CM | POA: Diagnosis not present

## 2021-01-02 DIAGNOSIS — Z7901 Long term (current) use of anticoagulants: Secondary | ICD-10-CM | POA: Diagnosis not present

## 2021-01-02 DIAGNOSIS — J449 Chronic obstructive pulmonary disease, unspecified: Secondary | ICD-10-CM | POA: Diagnosis not present

## 2021-01-06 ENCOUNTER — Other Ambulatory Visit: Payer: Self-pay | Admitting: Family

## 2021-01-07 ENCOUNTER — Other Ambulatory Visit: Payer: Self-pay

## 2021-01-07 ENCOUNTER — Encounter: Payer: Self-pay | Admitting: *Deleted

## 2021-01-07 ENCOUNTER — Encounter: Payer: PPO | Attending: Cardiology | Admitting: *Deleted

## 2021-01-07 DIAGNOSIS — I5022 Chronic systolic (congestive) heart failure: Secondary | ICD-10-CM | POA: Insufficient documentation

## 2021-01-07 NOTE — Progress Notes (Signed)
Virtual orientation call completed today. he has an appointment on Date: 01/17/2021 for EP eval and gym Orientation.  Documentation of diagnosis can be found in [facility]  Date: 12/19/2020.  [name] is a current tobacco user. Intervention for tobacco cessation was provided at the initial medical review. He was asked about readiness to quit and reported he is not ready to quit at this time . Patient was advised and educated about tobacco cessation using combination therapy, tobacco cessation classes, quit line, and quit smoking apps. Patient demonstrated understanding of this material. Staff will continue to provide encouragement and follow up with the patient throughout the program.

## 2021-01-08 ENCOUNTER — Encounter: Payer: Self-pay | Admitting: *Deleted

## 2021-01-16 DIAGNOSIS — J449 Chronic obstructive pulmonary disease, unspecified: Secondary | ICD-10-CM | POA: Diagnosis not present

## 2021-01-16 DIAGNOSIS — Z7901 Long term (current) use of anticoagulants: Secondary | ICD-10-CM | POA: Diagnosis not present

## 2021-01-16 DIAGNOSIS — Z794 Long term (current) use of insulin: Secondary | ICD-10-CM | POA: Diagnosis not present

## 2021-01-16 DIAGNOSIS — J9601 Acute respiratory failure with hypoxia: Secondary | ICD-10-CM | POA: Diagnosis not present

## 2021-01-16 DIAGNOSIS — I2510 Atherosclerotic heart disease of native coronary artery without angina pectoris: Secondary | ICD-10-CM | POA: Diagnosis not present

## 2021-01-16 DIAGNOSIS — E785 Hyperlipidemia, unspecified: Secondary | ICD-10-CM | POA: Diagnosis not present

## 2021-01-16 DIAGNOSIS — E1151 Type 2 diabetes mellitus with diabetic peripheral angiopathy without gangrene: Secondary | ICD-10-CM | POA: Diagnosis not present

## 2021-01-16 DIAGNOSIS — Z9181 History of falling: Secondary | ICD-10-CM | POA: Diagnosis not present

## 2021-01-16 DIAGNOSIS — E1142 Type 2 diabetes mellitus with diabetic polyneuropathy: Secondary | ICD-10-CM | POA: Diagnosis not present

## 2021-01-16 DIAGNOSIS — I110 Hypertensive heart disease with heart failure: Secondary | ICD-10-CM | POA: Diagnosis not present

## 2021-01-16 DIAGNOSIS — I5040 Unspecified combined systolic (congestive) and diastolic (congestive) heart failure: Secondary | ICD-10-CM | POA: Diagnosis not present

## 2021-01-16 DIAGNOSIS — I482 Chronic atrial fibrillation, unspecified: Secondary | ICD-10-CM | POA: Diagnosis not present

## 2021-01-17 ENCOUNTER — Encounter: Payer: PPO | Admitting: *Deleted

## 2021-01-17 ENCOUNTER — Other Ambulatory Visit: Payer: Self-pay

## 2021-01-17 VITALS — Ht 66.0 in | Wt 160.3 lb

## 2021-01-17 DIAGNOSIS — I5022 Chronic systolic (congestive) heart failure: Secondary | ICD-10-CM | POA: Diagnosis not present

## 2021-01-17 NOTE — Progress Notes (Signed)
Pulmonary Individual Treatment Plan  Patient Details  Name: Katrina Brosh MRN: [id number] Date of Birth: [birth year] Referring Provider:   Flowsheet Row Pulmonary Rehab from 01/17/2021 in [facility] Cardiac and Pulmonary Rehab  Referring Provider Bartholome Bill MD      Initial Encounter Date:  Flowsheet Row Pulmonary Rehab from 01/17/2021 in [facility] Cardiac and Pulmonary Rehab  Date 01/17/21      Visit Diagnosis: Heart failure, chronic systolic ([place])  Patient's Home Medications on Admission:  Current Outpatient Medications:    acetaminophen (TYLENOL) 325 MG tablet, Take 2 tablets (650 mg total) by mouth every 6 (six) hours as needed for mild pain (or Fever >/= 101)., Disp: , Rfl:    albuterol (PROVENTIL HFA;VENTOLIN HFA) 108 (90 Base) MCG/ACT inhaler, Inhale 2 puffs into the lungs every 6 (six) hours as needed for wheezing or shortness of breath., Disp: , Rfl:    amLODipine (NORVASC) 5 MG tablet, Take 1 tablet (5 mg total) by mouth daily., Disp: 30 tablet, Rfl: 1   apixaban (ELIQUIS) 5 MG TABS tablet, Take 1 tablet (5 mg total) by mouth 2 (two) times daily., Disp: 180 tablet, Rfl: 3   bifidobacterium infantis (ALIGN) capsule, Take 1 capsule by mouth every morning., Disp: , Rfl:    cholecalciferol (VITAMIN D) 1000 units tablet, Take 1,000 Units by mouth daily., Disp: , Rfl:    Cinnamon 500 MG capsule, Take 1,000 mg by mouth 2 (two) times daily., Disp: , Rfl:    COMBIVENT RESPIMAT 20-100 MCG/ACT AERS respimat, Inhale 2 puffs into the lungs 4 (four) times daily as needed., Disp: , Rfl:    CRANBERRY EXTRACT PO, Take 500 mg by mouth every morning. , Disp: , Rfl:    Ensure Max Protein (ENSURE MAX PROTEIN) LIQD, Take 330 mLs (11 oz total) by mouth 2 (two) times daily between meals., Disp:  , Rfl:    gabapentin (NEURONTIN) 100 MG capsule, Take 100 mg by mouth 2 (two) times daily. (Patient not taking: Reported on 01/07/2021), Disp: , Rfl:    glucose 4 GM chewable tablet, Chew 1 tablet by mouth as  needed for low blood sugar., Disp: , Rfl:    hydrALAZINE (APRESOLINE) 50 MG tablet, Take 1 tablet (50 mg total) by mouth every 8 (eight) hours., Disp:  , Rfl:    insulin aspart (NOVOLOG) 100 UNIT/ML injection, 0-9 Units, Subcutaneous, 3 times daily with meals CBG < 70: Implement Hypoglycemia protocol/measures CBG 70 - 120: 0 units CBG 121 - 150: 1 unit CBG 151 - 200: 2 units CBG 201 - 250: 3 units CBG 251 - 300: 5 units CBG 301 - 350: 7 units CBG 351 - 400: 9 units CBG > 400: call MD (Patient taking differently: Inject 5 Units into the skin 3 (three) times daily with meals.), Disp: 10 mL, Rfl: 11   isosorbide mononitrate (IMDUR) 30 MG 24 hr tablet, Take 1 tablet (30 mg total) by mouth daily., Disp:  , Rfl:    lactase (LACTAID) 3000 units tablet, Take 3,000 Units by mouth 3 (three) times daily between meals as needed., Disp: , Rfl:    LANTUS SOLOSTAR 100 UNIT/ML Solostar Pen, Inject 45 Units into the skin daily at 10 pm., Disp: 15 mL, Rfl: 11   Magnesium 250 MG TABS, Take 250 mg by mouth daily., Disp: , Rfl:    Misc Natural Products (TOTAL MEMORY & FOCUS FORMULA PO), Take 1 capsule by mouth See admin instructions. Take 2 capsule at 9:00 AM, 1 capsule at 5:00 PM, and  1 capsule at bedtime. (Patient not taking: Reported on 01/07/2021), Disp: , Rfl:    Multiple Vitamin (MULTIVITAMIN WITH MINERALS) TABS tablet, Take 1 tablet by mouth daily., Disp: , Rfl:    NON FORMULARY, Take 1 Dose by mouth 3 (three) times daily. Relief Factor OTC, Disp: , Rfl:    omeprazole (PRILOSEC) 20 MG capsule, Take 20 mg by mouth daily., Disp: , Rfl:    potassium chloride (K-DUR) 10 MEQ tablet, Take 10 mEq by mouth daily., Disp: , Rfl:    simvastatin (ZOCOR) 20 MG tablet, Take 20 mg by mouth at bedtime., Disp: , Rfl:    SUPER B COMPLEX/C PO, Take 1 tablet by mouth every morning., Disp: , Rfl:    torsemide (DEMADEX) 20 MG tablet, Take 2 tablets (40 mg total) by mouth daily., Disp: 60 tablet, Rfl: 0   traZODone (DESYREL)  50 MG tablet, Take 50 mg by mouth at bedtime., Disp: , Rfl:    vitamin C (ASCORBIC ACID) 500 MG tablet, Take 500 mg by mouth 2 (two) times daily. , Disp: , Rfl:  No current facility-administered medications for this visit.  Facility-Administered Medications Ordered in Other Visits:    furosemide (LASIX) injection 80 mg, 80 mg, Intravenous, Once, Fath, Javier Docker, MD   potassium chloride (KLOR-CON) CR tablet 40 mEq, 40 mEq, Oral, Once, Teodoro Spray, MD  Past Medical History: Past Medical History:  Diagnosis Date   Arrhythmia    atrial fibrillation   Atrial fibrillation ([place])    Basal cell carcinoma 05/2019   right nasal ala, Tx: EDC   Basal cell carcinoma 04/12/2019   Left posterior ear. Nodular pattern, excoriated.    CHF (congestive heart failure) (HCC)    COPD (chronic obstructive pulmonary disease) (HCC)    Coronary artery disease    Diabetes mellitus without complication (HCC)    GERD (gastroesophageal reflux disease)    HLD (hyperlipidemia)    Hypertension    Peripheral vascular disease (HCC)    Pneumonia    Prostate cancer ([place])    Squamous cell carcinoma of skin 08/10/2018   Left upper arm above elbow. WD SCC.   Squamous cell carcinoma of skin 09/07/2018   Right forearm, below elbow. WD SCC. [facility] 01/10/2019.   Stroke ([place])     Tobacco Use: Social History   Tobacco Use  Smoking Status Current Every Day Smoker   Packs/day: 1.00   Years: 54.00   Pack years: 54.00   Types: Cigarettes  Smokeless Tobacco Never Used    Labs: Recent Chemical engineer    Labs for ITP Cardiac and Pulmonary Rehab Latest Ref Rng & Units 10/12/2019 12/26/2019 10/23/2020 10/24/2020 10/24/2020   Hemoglobin A1c 4.8 - 5.6 % 8.9(H) 7.6(H) - 7.3(H) 7.6(H)   PHART 7.350 - 7.450 - 7.32(L) - - -   PCO2ART 32.0 - 48.0 mmHg - 44 - - -   HCO3 20.0 - 28.0 mmol/L - 22.7 34.4(H) - -   TCO2 0 - 100 mmol/L - - - - -   ACIDBASEDEF 0.0 - 2.0 mmol/L - 3.5(H) - - -   O2SAT % -  99.5 58.9 - -       Pulmonary Assessment Scores:  Pulmonary Assessment Scores    Row Name 01/17/21 1044         ADL UCSD   ADL Phase Entry     SOB Score total 85     Rest 2     Walk 3     Stairs 5  Bath 3     Dress 3     Shop 2           CAT Score   CAT Score 27           mMRC Score   mMRC Score 3            UCSD: Self-administered rating of dyspnea associated with activities of daily living (ADLs) 6-point scale (0 = "not at all" to 5 = "maximal or unable to do because of breathlessness")  Scoring Scores range from 0 to 120.  Minimally important difference is 5 units  CAT: CAT can identify the health impairment of COPD patients and is better correlated with disease progression.  CAT has a scoring range of zero to 40. The CAT score is classified into four groups of low (less than 10), medium (10 - 20), high (21-30) and very high (31-40) based on the impact level of disease on health status. A CAT score over 10 suggests significant symptoms.  A worsening CAT score could be explained by an exacerbation, poor medication adherence, poor inhaler technique, or progression of COPD or comorbid conditions.  CAT MCID is 2 points  mMRC: mMRC (Modified Medical Research Council) Dyspnea Scale is used to assess the degree of baseline functional disability in patients of respiratory disease due to dyspnea. No minimal important difference is established. A decrease in score of 1 point or greater is considered a positive change.   Pulmonary Function Assessment:   Exercise Target Goals: Exercise Program Goal: Individual exercise prescription set using results from initial 6 min walk test and THRR while considering  patients activity barriers and safety.   Exercise Prescription Goal: Initial exercise prescription builds to 30-45 minutes a day of aerobic activity, 2-3 days per week.  Home exercise guidelines will be given to patient during program as part of exercise prescription  that the participant will acknowledge.  Education: Aerobic Exercise: - Group verbal and visual presentation on the components of exercise prescription. Introduces F.I.T.T principle from ACSM for exercise prescriptions.  Reviews F.I.T.T. principles of aerobic exercise including progression. Written material given at graduation. Flowsheet Row Pulmonary Rehab from 01/17/2021 in [facility] Cardiac and Pulmonary Rehab  Education need identified 01/17/21      Education: Resistance Exercise: - Group verbal and visual presentation on the components of exercise prescription. Introduces F.I.T.T principle from ACSM for exercise prescriptions  Reviews F.I.T.T. principles of resistance exercise including progression. Written material given at graduation.    Education: Exercise & Equipment Safety: - Individual verbal instruction and demonstration of equipment use and safety with use of the equipment. Flowsheet Row Pulmonary Rehab from 01/17/2021 in [facility] Cardiac and Pulmonary Rehab  Date 01/17/21  Educator [place]  Instruction Review Code 1- Verbalizes Understanding      Education: Exercise Physiology & General Exercise Guidelines: - Group verbal and written instruction with models to review the exercise physiology of the cardiovascular system and associated critical values. Provides general exercise guidelines with specific guidelines to those with heart or lung disease.    Education: Flexibility, Balance, Mind/Body Relaxation: - Group verbal and visual presentation with interactive activity on the components of exercise prescription. Introduces F.I.T.T principle from ACSM for exercise prescriptions. Reviews F.I.T.T. principles of flexibility and balance exercise training including progression. Also discusses the mind body connection.  Reviews various relaxation techniques to help reduce and manage stress (i.e. Deep breathing, progressive muscle relaxation, and visualization). Balance handout provided to take home.  Written material given at graduation.  Activity Barriers & Risk Stratification:  Activity Barriers & Cardiac Risk Stratification - 01/17/21 1036      Activity Barriers & Cardiac Risk Stratification   Activity Barriers Assistive Device;Balance Concerns;Shortness of Breath;Deconditioning;Muscular Weakness;History of Falls;Other (comment)    Comments residuals from stroke, double vision    Cardiac Risk Stratification Moderate           6 Minute Walk:  6 Minute Walk    Row Name 01/17/21 1034         6 Minute Walk   Phase --  NuStep Test     Distance 253 feet  steps     Walk Time 6 minutes     # of Rest Breaks 0     METS 1.1     RPE 13     Perceived Dyspnea  2     Symptoms Yes (comment)     Comments SOB, fatigue     Resting HR 65 bpm     Resting BP 154/64     Resting Oxygen Saturation  96 %     Exercise Oxygen Saturation  during 6 min walk 92 %     Max Ex. HR 72 bpm     Max Ex. BP 168/84     2 Minute Post BP 144/64           Interval HR   1 Minute HR 70     2 Minute HR 72     3 Minute HR 72     4 Minute HR 64     5 Minute HR 67     6 Minute HR 68     2 Minute Post HR 56     Interval Heart Rate? Yes           Interval Oxygen   Interval Oxygen? Yes     Baseline Oxygen Saturation % 96 %     1 Minute Oxygen Saturation % 93 %     1 Minute Liters of Oxygen 0 L  Room Air     2 Minute Oxygen Saturation % 92 %     2 Minute Liters of Oxygen 0 L     3 Minute Oxygen Saturation % 93 %     3 Minute Liters of Oxygen 0 L     4 Minute Oxygen Saturation % 98 %     4 Minute Liters of Oxygen 0 L     5 Minute Oxygen Saturation % 97 %     5 Minute Liters of Oxygen 0 L     6 Minute Oxygen Saturation % 97 %     6 Minute Liters of Oxygen 0 L     2 Minute Post Oxygen Saturation % 97 %     2 Minute Post Liters of Oxygen 0 L           Oxygen Initial Assessment:  Oxygen Initial Assessment - 01/07/21 1537      Home Oxygen   Home Oxygen Device None    Sleep Oxygen  Prescription None    Home Exercise Oxygen Prescription None    Home Resting Oxygen Prescription None    Compliance with Home Oxygen Use Yes      Intervention   Short Term Goals To learn and demonstrate proper use of respiratory medications;To learn and demonstrate proper pursed lip breathing techniques or other breathing techniques.    Long  Term Goals Exhibits proper breathing techniques, such as pursed lip breathing or other method  taught during program session;Compliance with respiratory medication           Oxygen Re-Evaluation:   Oxygen Discharge (Final Oxygen Re-Evaluation):   Initial Exercise Prescription:  Initial Exercise Prescription - 01/17/21 1000      Date of Initial Exercise RX and Referring Provider   Date 01/17/21    Referring Provider Bartholome Bill MD      Recumbant Bike   Level 1    RPM 60    Minutes 15    METs 1.2      NuStep   Level 1    SPM 60    Minutes 15    METs 1.2      Arm Ergometer   Level 1    RPM 25    Minutes 15    METs 1      Prescription Details   Frequency (times per week) 2    Duration Progress to 30 minutes of continuous aerobic without signs/symptoms of physical distress      Intensity   THRR 40-80% of Max Heartrate 97-129    Ratings of Perceived Exertion 11-13    Perceived Dyspnea 0-4      Progression   Progression Continue to progress workloads to maintain intensity without signs/symptoms of physical distress.      Resistance Training   Training Prescription Yes    Weight 3 lb    Reps 10-15           Perform Capillary Blood Glucose checks as needed.  Exercise Prescription Changes:  Exercise Prescription Changes    Row Name 01/17/21 1000             Response to Exercise   Blood Pressure (Admit) 154/64       Blood Pressure (Exercise) 168/84       Blood Pressure (Exit) 124/64       Heart Rate (Admit) 65 bpm       Heart Rate (Exercise) 72 bpm       Heart Rate (Exit) 65 bpm       Oxygen Saturation  (Admit) 96 %       Oxygen Saturation (Exercise) 92 %       Oxygen Saturation (Exit) 95 %       Rating of Perceived Exertion (Exercise) 13       Perceived Dyspnea (Exercise) 2       Symptoms SOB, fatigue       Comments NuStep test results              Exercise Comments:   Exercise Goals and Review:  Exercise Goals    Row Name 01/17/21 1040             Exercise Goals   Increase Physical Activity Yes       Intervention Provide advice, education, support and counseling about physical activity/exercise needs.;Develop an individualized exercise prescription for aerobic and resistive training based on initial evaluation findings, risk stratification, comorbidities and participant's personal goals.       Expected Outcomes Short Term: Attend rehab on a regular basis to increase amount of physical activity.;Long Term: Add in home exercise to make exercise part of routine and to increase amount of physical activity.;Long Term: Exercising regularly at least 3-5 days a week.       Increase Strength and Stamina Yes       Intervention Provide advice, education, support and counseling about physical activity/exercise needs.;Develop an individualized exercise prescription for aerobic and resistive training based on  initial evaluation findings, risk stratification, comorbidities and participant's personal goals.       Expected Outcomes Short Term: Perform resistance training exercises routinely during rehab and add in resistance training at home;Long Term: Improve cardiorespiratory fitness, muscular endurance and strength as measured by increased METs and functional capacity (6MWT);Short Term: Increase workloads from initial exercise prescription for resistance, speed, and METs.       Able to understand and use rate of perceived exertion (RPE) scale Yes       Intervention Provide education and explanation on how to use RPE scale       Expected Outcomes Short Term: Able to use RPE daily in rehab to  express subjective intensity level;Long Term:  Able to use RPE to guide intensity level when exercising independently       Able to understand and use Dyspnea scale Yes       Intervention Provide education and explanation on how to use Dyspnea scale       Expected Outcomes Short Term: Able to use Dyspnea scale daily in rehab to express subjective sense of shortness of breath during exertion;Long Term: Able to use Dyspnea scale to guide intensity level when exercising independently       Knowledge and understanding of Target Heart Rate Range (THRR) Yes       Intervention Provide education and explanation of THRR including how the numbers were predicted and where they are located for reference       Expected Outcomes Short Term: Able to state/look up THRR;Short Term: Able to use daily as guideline for intensity in rehab;Long Term: Able to use THRR to govern intensity when exercising independently       Able to check pulse independently Yes       Intervention Provide education and demonstration on how to check pulse in carotid and radial arteries.;Review the importance of being able to check your own pulse for safety during independent exercise       Expected Outcomes Short Term: Able to explain why pulse checking is important during independent exercise;Long Term: Able to check pulse independently and accurately       Understanding of Exercise Prescription Yes       Intervention Provide education, explanation, and written materials on patient's individual exercise prescription       Expected Outcomes Short Term: Able to explain program exercise prescription;Long Term: Able to explain home exercise prescription to exercise independently              Exercise Goals Re-Evaluation :   Discharge Exercise Prescription (Final Exercise Prescription Changes):  Exercise Prescription Changes - 01/17/21 1000      Response to Exercise   Blood Pressure (Admit) 154/64    Blood Pressure (Exercise) 168/84     Blood Pressure (Exit) 124/64    Heart Rate (Admit) 65 bpm    Heart Rate (Exercise) 72 bpm    Heart Rate (Exit) 65 bpm    Oxygen Saturation (Admit) 96 %    Oxygen Saturation (Exercise) 92 %    Oxygen Saturation (Exit) 95 %    Rating of Perceived Exertion (Exercise) 13    Perceived Dyspnea (Exercise) 2    Symptoms SOB, fatigue    Comments NuStep test results           Nutrition:  Target Goals: Understanding of nutrition guidelines, daily intake of sodium 1500mg , cholesterol 200mg , calories 30% from fat and 7% or less from saturated fats, daily to have 5 or more servings of  fruits and vegetables.  Education: All About Nutrition: -Group instruction provided by verbal, written material, interactive activities, discussions, models, and posters to present general guidelines for heart healthy nutrition including fat, fiber, MyPlate, the role of sodium in heart healthy nutrition, utilization of the nutrition label, and utilization of this knowledge for meal planning. Follow up email sent as well. Written material given at graduation. Flowsheet Row Pulmonary Rehab from 01/17/2021 in [facility] Cardiac and Pulmonary Rehab  Education need identified 01/17/21      Biometrics:  Pre Biometrics - 01/17/21 1040      Pre Biometrics   Height 5\' 6"  (1.676 m)    Weight 160 lb 4.8 oz (72.7 kg)    BMI (Calculated) 25.89            Nutrition Therapy Plan and Nutrition Goals:   Nutrition Assessments:  MEDIFICTS Score Key:  ?70 Need to make dietary changes   40-70 Heart Healthy Diet  ? 40 Therapeutic Level Cholesterol Diet  Flowsheet Row Pulmonary Rehab from 01/17/2021 in [facility] Cardiac and Pulmonary Rehab  Picture Your Plate Total Score on Admission 70     Picture Your Plate Scores:  <76 Unhealthy dietary pattern with much room for improvement.  41-50 Dietary pattern unlikely to meet recommendations for good health and room for improvement.  51-60 More healthful dietary pattern, with  some room for improvement.   >60 Healthy dietary pattern, although there may be some specific behaviors that could be improved.   Nutrition Goals Re-Evaluation:   Nutrition Goals Discharge (Final Nutrition Goals Re-Evaluation):   Psychosocial: Target Goals: Acknowledge presence or absence of significant depression and/or stress, maximize coping skills, provide positive support system. Participant is able to verbalize types and ability to use techniques and skills needed for reducing stress and depression.   Education: Stress, Anxiety, and Depression - Group verbal and visual presentation to define topics covered.  Reviews how body is impacted by stress, anxiety, and depression.  Also discusses healthy ways to reduce stress and to treat/manage anxiety and depression.  Written material given at graduation.   Education: Sleep Hygiene -Provides group verbal and written instruction about how sleep can affect your health.  Define sleep hygiene, discuss sleep cycles and impact of sleep habits. Review good sleep hygiene tips.    Initial Review & Psychosocial Screening:  Initial Psych Review & Screening - 01/07/21 1538      Initial Review   Current issues with None Identified      Family Dynamics   Good Support System? Yes   wife     Barriers   Psychosocial barriers to participate in program There are no identifiable barriers or psychosocial needs.      Screening Interventions   Interventions Encouraged to exercise;To provide support and resources with identified psychosocial needs;Provide feedback about the scores to participant    Expected Outcomes Short Term goal: Utilizing psychosocial counselor, staff and physician to assist with identification of specific Stressors or current issues interfering with healing process. Setting desired goal for each stressor or current issue identified.;Long Term Goal: Stressors or current issues are controlled or eliminated.;Short Term goal:  Identification and review with participant of any Quality of Life or Depression concerns found by scoring the questionnaire.;Long Term goal: The participant improves quality of Life and PHQ9 Scores as seen by post scores and/or verbalization of changes           Quality of Life Scores:  Quality of Life - 01/17/21 1040  Quality of Life   Select Quality of Life      Quality of Life Scores   Health/Function Pre 15.23 %    Socioeconomic Pre 23.25 %    Psych/Spiritual Pre 22.36 %    Family Pre 24 %    GLOBAL Pre 19.39 %          Scores of 19 and below usually indicate a poorer quality of life in these areas.  A difference of  2-3 points is a clinically meaningful difference.  A difference of 2-3 points in the total score of the Quality of Life Index has been associated with significant improvement in overall quality of life, self-image, physical symptoms, and general health in studies assessing change in quality of life.  PHQ-9: Recent Review Flowsheet Data    Depression screen [facility] 2/9 01/17/2021 08/26/2017 11/24/2016 01/09/2016 12/12/2015   Decreased Interest 0 0 0 0 0   Down, Depressed, Hopeless 0 0 0 0 0   PHQ - 2 Score 0 0 0 0 0   Altered sleeping 3 - - - -   Tired, decreased energy 3 - - - -   Change in appetite 0 - - - -   Feeling bad or failure about yourself  1 - - - -   Trouble concentrating 1 - - - -   Moving slowly or fidgety/restless 0 - - - -   Suicidal thoughts 0 - - - -   PHQ-9 Score 8 - - - -   Difficult doing work/chores Very difficult - - - -     Interpretation of Total Score  Total Score Depression Severity:  1-4 = Minimal depression, 5-9 = Mild depression, 10-14 = Moderate depression, 15-19 = Moderately severe depression, 20-27 = Severe depression   Psychosocial Evaluation and Intervention:  Psychosocial Evaluation - 01/07/21 1555      Psychosocial Evaluation & Interventions   Comments Zerick has no barriers to attending the program. He is ready to attend  and hopes to see improved shortness of breath from the program. He lives with his wife, his only support, and their 2 cats. 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 He stated no issues with depression, stress nor anxiety.  He does depend on his wife for transportation after several mini strokes caused imbalance and some eye concerns.  He is ready to get started and should do well.           Psychosocial Re-Evaluation:   Psychosocial Discharge (Final Psychosocial Re-Evaluation):   Education: Education Goals: Education classes will be provided on a weekly basis, covering required topics. Participant will state understanding/return demonstration of topics presented.  Learning Barriers/Preferences:   General Pulmonary Education Topics:  Infection Prevention: - Provides verbal and written material to individual with discussion of infection control including proper hand washing and proper equipment cleaning during exercise session. Flowsheet Row Pulmonary Rehab from 01/17/2021 in [facility] Cardiac and Pulmonary Rehab  Date 01/17/21  Educator [facility]  Instruction Review Code 1- Verbalizes Understanding      Falls Prevention: - Provides verbal and written material to individual with discussion of falls prevention and safety. Flowsheet Row Pulmonary Rehab from 01/17/2021 in [facility] Cardiac and Pulmonary Rehab  Date 01/17/21  Educator [facility]  Instruction Review Code 1- Verbalizes Understanding      Chronic Lung Disease Review: - Group verbal instruction with posters, models, PowerPoint presentations and videos,  to review new updates, new respiratory medications, new advancements  in procedures and treatments. Providing information on websites and "800" numbers for continued self-education. Includes information about supplement oxygen, available portable oxygen systems, continuous and intermittent flow rates, oxygen safety, concentrators, and Medicare reimbursement for  oxygen. Explanation of Pulmonary Drugs, including class, frequency, complications, importance of spacers, rinsing mouth after steroid MDI's, and proper cleaning methods for nebulizers. Review of basic lung anatomy and physiology related to function, structure, and complications of lung disease. Review of risk factors. Discussion about methods for diagnosing sleep apnea and types of masks and machines for OSA. Includes a review of the use of types of environmental controls: home humidity, furnaces, filters, dust mite/pet prevention, HEPA vacuums. Discussion about weather changes, air quality and the benefits of nasal washing. Instruction on Warning signs, infection symptoms, calling MD promptly, preventive modes, and value of vaccinations. Review of effective airway clearance, coughing and/or vibration techniques. Emphasizing that all should Create an Action Plan. Written material given at graduation.   AED/CPR: - Group verbal and written instruction with the use of models to demonstrate the basic use of the AED with the basic ABC's of resuscitation.    Anatomy and Cardiac Procedures: - Group verbal and visual presentation and models provide information about basic cardiac anatomy and function. Reviews the testing methods done to diagnose heart disease and the outcomes of the test results. Describes the treatment choices: Medical Management, Angioplasty, or Coronary Bypass Surgery for treating various heart conditions including Myocardial Infarction, Angina, Valve Disease, and Cardiac Arrhythmias.  Written material given at graduation. Flowsheet Row Pulmonary Rehab from 01/17/2021 in [facility] Cardiac and Pulmonary Rehab  Education need identified 01/17/21      Medication Safety: - Group verbal and visual instruction to review commonly prescribed medications for heart and lung disease. Reviews the medication, class of the drug, and side effects. Includes the steps to properly store meds and maintain the  prescription regimen.  Written material given at graduation.   Other: -Provides group and verbal instruction on various topics (see comments)   Knowledge Questionnaire Score:  Knowledge Questionnaire Score - 01/17/21 1042      Knowledge Questionnaire Score   Pre Score 23/26 Education Focus: nurtrition, exercise, angina            Core Components/Risk Factors/Patient Goals at Admission:  Personal Goals and Risk Factors at Admission - 01/17/21 1042      Core Components/Risk Factors/Patient Goals on Admission    Weight Management Yes;Weight Loss    Intervention Weight Management: Develop a combined nutrition and exercise program designed to reach desired caloric intake, while maintaining appropriate intake of nutrient and fiber, sodium and fats, and appropriate energy expenditure required for the weight goal.;Weight Management: Provide education and appropriate resources to help participant work on and attain dietary goals.;Weight Management/Obesity: Establish reasonable short term and long term weight goals.    Admit Weight 160 lb 4.8 oz (72.7 kg)    Goal Weight: Short Term 157 lb (71.2 kg)    Goal Weight: Long Term 155 lb (70.3 kg)    Expected Outcomes Short Term: Continue to assess and modify interventions until short term weight is achieved;Long Term: Adherence to nutrition and physical activity/exercise program aimed toward attainment of established weight goal;Weight Maintenance: Understanding of the daily nutrition guidelines, which includes 25-35% calories from fat, 7% or less cal from saturated fats, less than 200mg  cholesterol, less than 1.5gm of sodium, & 5 or more servings of fruits and vegetables daily    Tobacco Cessation Yes    Number of  packs per day Zaeem is a current tobacco user. Intervention for tobacco cessation was provided at the initial medical review. He was asked about readiness to quit and reported he is not ready to quit at this time . Patient was advised and  educated about tobacco cessation using combination therapy, tobacco cessation classes, quit line, and quit smoking apps. Patient demonstrated understanding of this material. Staff will continue to provide encouragement and follow up with the patient throughout the program.    Intervention Assist the participant in steps to quit. Provide individualized education and counseling about committing to Tobacco Cessation, relapse prevention, and pharmacological support that can be provided by physician.;Advice worker, assist with locating and accessing local/national Quit Smoking programs, and support quit date choice.    Expected Outcomes Short Term: Will demonstrate readiness to quit, by selecting a quit date.;Short Term: Will quit all tobacco product use, adhering to prevention of relapse plan.;Long Term: Complete abstinence from all tobacco products for at least 12 months from quit date.    Diabetes Yes    Intervention Provide education about signs/symptoms and action to take for hypo/hyperglycemia.;Provide education about proper nutrition, including hydration, and aerobic/resistive exercise prescription along with prescribed medications to achieve blood glucose in normal ranges: Fasting glucose 65-99 mg/dL    Expected Outcomes Short Term: Participant verbalizes understanding of the signs/symptoms and immediate care of hyper/hypoglycemia, proper foot care and importance of medication, aerobic/resistive exercise and nutrition plan for blood glucose control.;Long Term: Attainment of HbA1C < 7%.    Heart Failure Yes    Intervention Provide a combined exercise and nutrition program that is supplemented with education, support and counseling about heart failure. Directed toward relieving symptoms such as shortness of breath, decreased exercise tolerance, and extremity edema.    Expected Outcomes Improve functional capacity of life;Short term: Attendance in program 2-3 days a week with increased  exercise capacity. Reported lower sodium intake. Reported increased fruit and vegetable intake. Reports medication compliance.;Short term: Daily weights obtained and reported for increase. Utilizing diuretic protocols set by physician.;Long term: Adoption of self-care skills and reduction of barriers for early signs and symptoms recognition and intervention leading to self-care maintenance.    Hypertension Yes    Intervention Provide education on lifestyle modifcations including regular physical activity/exercise, weight management, moderate sodium restriction and increased consumption of fresh fruit, vegetables, and low fat dairy, alcohol moderation, and smoking cessation.;Monitor prescription use compliance.    Expected Outcomes Short Term: Continued assessment and intervention until BP is < 140/55mm HG in hypertensive participants. < 130/50mm HG in hypertensive participants with diabetes, heart failure or chronic kidney disease.;Long Term: Maintenance of blood pressure at goal levels.    Lipids Yes    Intervention Provide education and support for participant on nutrition & aerobic/resistive exercise along with prescribed medications to achieve LDL 70mg , HDL >40mg .    Expected Outcomes Short Term: Participant states understanding of desired cholesterol values and is compliant with medications prescribed. Participant is following exercise prescription and nutrition guidelines.;Long Term: Cholesterol controlled with medications as prescribed, with individualized exercise RX and with personalized nutrition plan. Value goals: LDL < 70mg , HDL > 40 mg.           Education:Diabetes - Individual verbal and written instruction to review signs/symptoms of diabetes, desired ranges of glucose level fasting, after meals and with exercise. Acknowledge that pre and post exercise glucose checks will be done for 3 sessions at entry of program. Flowsheet Row Pulmonary Rehab from 01/17/2021 in [facility] Cardiac and  Pulmonary Rehab  Date 01/17/21  Educator [facility]  Instruction Review Code 1- Verbalizes Understanding      Know Your Numbers and Heart Failure: - Group verbal and visual instruction to discuss disease risk factors for cardiac and pulmonary disease and treatment options.  Reviews associated critical values for Overweight/Obesity, Hypertension, Cholesterol, and Diabetes.  Discusses basics of heart failure: signs/symptoms and treatments.  Introduces Heart Failure Zone chart for action plan for heart failure.  Written material given at graduation.   Core Components/Risk Factors/Patient Goals Review:    Core Components/Risk Factors/Patient Goals at Discharge (Final Review):    ITP Comments:  ITP Comments    Row Name 01/07/21 1553 01/17/21 1034         ITP Comments Virtual orientation call completed today. he has an appointment on Date: 01/17/2021 for EP eval and gym Orientation.  Documentation of diagnosis can be found in [facility]  Date: 12/19/2020.   Oral is a current tobacco user. Intervention for tobacco cessation was provided at the initial medical review. He was asked about readiness to quit and reported he is not ready to quit at this time . Patient was advised and educated about tobacco cessation using combination therapy, tobacco cessation classes, quit line, and quit smoking apps. Patient demonstrated understanding of this material. Staff will continue to provide encouragement and follow up with the patient throughout the program. Completed 6MWT and gym orientation. Initial ITP created and sent for review to Dr. Emily Filbert, Medical Director.             Comments: Initial ITP

## 2021-01-17 NOTE — Patient Instructions (Signed)
Patient Instructions  Patient Details  Name: [name] MRN: [id number] Date of Birth: Oct 10, 1946 Referring Provider:  Teodoro Spray, MD  Below are your personal goals for exercise, nutrition, and risk factors. Our goal is to help you stay on track towards obtaining and maintaining these goals. We will be discussing your progress on these goals with you throughout the program.  Initial Exercise Prescription:  Initial Exercise Prescription - 01/17/21 1000      Date of Initial Exercise RX and Referring Provider   Date 01/17/21    Referring Provider Bartholome Bill MD      Recumbant Bike   Level 1    RPM 60    Minutes 15    METs 1.2      NuStep   Level 1    SPM 60    Minutes 15    METs 1.2      Arm Ergometer   Level 1    RPM 25    Minutes 15    METs 1      Prescription Details   Frequency (times per week) 2    Duration Progress to 30 minutes of continuous aerobic without signs/symptoms of physical distress      Intensity   THRR 40-80% of Max Heartrate 97-129    Ratings of Perceived Exertion 11-13    Perceived Dyspnea 0-4      Progression   Progression Continue to progress workloads to maintain intensity without signs/symptoms of physical distress.      Resistance Training   Training Prescription Yes    Weight 3 lb    Reps 10-15           Exercise Goals: Frequency: Be able to perform aerobic exercise two to three times per week in program working toward 2-5 days per week of home exercise.  Intensity: Work with a perceived exertion of 11 (fairly light) - 15 (hard) while following your exercise prescription.  We will make changes to your prescription with you as you progress through the program.   Duration: Be able to do 30 to 45 minutes of continuous aerobic exercise in addition to a 5 minute warm-up and a 5 minute cool-down routine.   Nutrition Goals: Your personal nutrition goals will be established when you do your nutrition analysis with the  dietician.  The following are general nutrition guidelines to follow: Cholesterol < 200mg /day Sodium < 1500mg /day Fiber: Men over 50 yrs - 30 grams per day  Personal Goals:  Personal Goals and Risk Factors at Admission - 01/17/21 1042      Core Components/Risk Factors/Patient Goals on Admission    Weight Management Yes;Weight Loss    Intervention Weight Management: Develop a combined nutrition and exercise program designed to reach desired caloric intake, while maintaining appropriate intake of nutrient and fiber, sodium and fats, and appropriate energy expenditure required for the weight goal.;Weight Management: Provide education and appropriate resources to help participant work on and attain dietary goals.;Weight Management/Obesity: Establish reasonable short term and long term weight goals.    Admit Weight 160 lb 4.8 oz (72.7 kg)    Goal Weight: Short Term 157 lb (71.2 kg)    Goal Weight: Long Term 155 lb (70.3 kg)    Expected Outcomes Short Term: Continue to assess and modify interventions until short term weight is achieved;Long Term: Adherence to nutrition and physical activity/exercise program aimed toward attainment of established weight goal;Weight Maintenance: Understanding of the daily nutrition guidelines, which includes 25-35% calories from fat, 7%  or less cal from saturated fats, less than 200mg  cholesterol, less than 1.5gm of sodium, & 5 or more servings of fruits and vegetables daily    Tobacco Cessation Yes    Number of packs per day Vernor is a current tobacco user. Intervention for tobacco cessation was provided at the initial medical review. He was asked about readiness to quit and reported he is not ready to quit at this time . Patient was advised and educated about tobacco cessation using combination therapy, tobacco cessation classes, quit line, and quit smoking apps. Patient demonstrated understanding of this material. Staff will continue to provide encouragement and follow  up with the patient throughout the program.    Intervention Assist the participant in steps to quit. Provide individualized education and counseling about committing to Tobacco Cessation, relapse prevention, and pharmacological support that can be provided by physician.;Advice worker, assist with locating and accessing local/national Quit Smoking programs, and support quit date choice.    Expected Outcomes Short Term: Will demonstrate readiness to quit, by selecting a quit date.;Short Term: Will quit all tobacco product use, adhering to prevention of relapse plan.;Long Term: Complete abstinence from all tobacco products for at least 12 months from quit date.    Diabetes Yes    Intervention Provide education about signs/symptoms and action to take for hypo/hyperglycemia.;Provide education about proper nutrition, including hydration, and aerobic/resistive exercise prescription along with prescribed medications to achieve blood glucose in normal ranges: Fasting glucose 65-99 mg/dL    Expected Outcomes Short Term: Participant verbalizes understanding of the signs/symptoms and immediate care of hyper/hypoglycemia, proper foot care and importance of medication, aerobic/resistive exercise and nutrition plan for blood glucose control.;Long Term: Attainment of HbA1C < 7%.    Heart Failure Yes    Intervention Provide a combined exercise and nutrition program that is supplemented with education, support and counseling about heart failure. Directed toward relieving symptoms such as shortness of breath, decreased exercise tolerance, and extremity edema.    Expected Outcomes Improve functional capacity of life;Short term: Attendance in program 2-3 days a week with increased exercise capacity. Reported lower sodium intake. Reported increased fruit and vegetable intake. Reports medication compliance.;Short term: Daily weights obtained and reported for increase. Utilizing diuretic protocols set by  physician.;Long term: Adoption of self-care skills and reduction of barriers for early signs and symptoms recognition and intervention leading to self-care maintenance.    Hypertension Yes    Intervention Provide education on lifestyle modifcations including regular physical activity/exercise, weight management, moderate sodium restriction and increased consumption of fresh fruit, vegetables, and low fat dairy, alcohol moderation, and smoking cessation.;Monitor prescription use compliance.    Expected Outcomes Short Term: Continued assessment and intervention until BP is < 140/60mm HG in hypertensive participants. < 130/82mm HG in hypertensive participants with diabetes, heart failure or chronic kidney disease.;Long Term: Maintenance of blood pressure at goal levels.    Lipids Yes    Intervention Provide education and support for participant on nutrition & aerobic/resistive exercise along with prescribed medications to achieve LDL 70mg , HDL >40mg .    Expected Outcomes Short Term: Participant states understanding of desired cholesterol values and is compliant with medications prescribed. Participant is following exercise prescription and nutrition guidelines.;Long Term: Cholesterol controlled with medications as prescribed, with individualized exercise RX and with personalized nutrition plan. Value goals: LDL < 70mg , HDL > 40 mg.           Tobacco Use Initial Evaluation: Social History   Tobacco Use  Smoking Status Current Every  Day Smoker  . Packs/day: 1.00  . Years: 54.00  . Pack years: 54.00  . Types: Cigarettes  Smokeless Tobacco Never Used    Exercise Goals and Review:  Exercise Goals    Row Name 01/17/21 1040             Exercise Goals   Increase Physical Activity Yes       Intervention Provide advice, education, support and counseling about physical activity/exercise needs.;Develop an individualized exercise prescription for aerobic and resistive training based on initial  evaluation findings, risk stratification, comorbidities and participant's personal goals.       Expected Outcomes Short Term: Attend rehab on a regular basis to increase amount of physical activity.;Long Term: Add in home exercise to make exercise part of routine and to increase amount of physical activity.;Long Term: Exercising regularly at least 3-5 days a week.       Increase Strength and Stamina Yes       Intervention Provide advice, education, support and counseling about physical activity/exercise needs.;Develop an individualized exercise prescription for aerobic and resistive training based on initial evaluation findings, risk stratification, comorbidities and participant's personal goals.       Expected Outcomes Short Term: Perform resistance training exercises routinely during rehab and add in resistance training at home;Long Term: Improve cardiorespiratory fitness, muscular endurance and strength as measured by increased METs and functional capacity (6MWT);Short Term: Increase workloads from initial exercise prescription for resistance, speed, and METs.       Able to understand and use rate of perceived exertion (RPE) scale Yes       Intervention Provide education and explanation on how to use RPE scale       Expected Outcomes Short Term: Able to use RPE daily in rehab to express subjective intensity level;Long Term:  Able to use RPE to guide intensity level when exercising independently       Able to understand and use Dyspnea scale Yes       Intervention Provide education and explanation on how to use Dyspnea scale       Expected Outcomes Short Term: Able to use Dyspnea scale daily in rehab to express subjective sense of shortness of breath during exertion;Long Term: Able to use Dyspnea scale to guide intensity level when exercising independently       Knowledge and understanding of Target Heart Rate Range (THRR) Yes       Intervention Provide education and explanation of THRR including how  the numbers were predicted and where they are located for reference       Expected Outcomes Short Term: Able to state/look up THRR;Short Term: Able to use daily as guideline for intensity in rehab;Long Term: Able to use THRR to govern intensity when exercising independently       Able to check pulse independently Yes       Intervention Provide education and demonstration on how to check pulse in carotid and radial arteries.;Review the importance of being able to check your own pulse for safety during independent exercise       Expected Outcomes Short Term: Able to explain why pulse checking is important during independent exercise;Long Term: Able to check pulse independently and accurately       Understanding of Exercise Prescription Yes       Intervention Provide education, explanation, and written materials on patient's individual exercise prescription       Expected Outcomes Short Term: Able to explain program exercise prescription;Long Term: Able to explain home  exercise prescription to exercise independently              Copy of goals given to participant.

## 2021-01-22 ENCOUNTER — Other Ambulatory Visit: Payer: Self-pay

## 2021-01-22 ENCOUNTER — Ambulatory Visit: Payer: PPO | Admitting: Family

## 2021-01-22 DIAGNOSIS — I5022 Chronic systolic (congestive) heart failure: Secondary | ICD-10-CM

## 2021-01-22 LAB — GLUCOSE, CAPILLARY
Glucose-Capillary: 128 mg/dL — ABNORMAL HIGH (ref 70–99)
Glucose-Capillary: 94 mg/dL (ref 70–99)
Glucose-Capillary: 121 mg/dL — ABNORMAL HIGH (ref 70–99)

## 2021-01-22 NOTE — Progress Notes (Signed)
Daily Session Note  Patient Details  Name: [name] MRN: [id number] Date of Birth: November 21, 1945 Referring Provider:   Flowsheet Row Pulmonary Rehab from 01/17/2021 in [facility] Cardiac and Pulmonary Rehab  Referring Provider Bartholome Bill MD      Encounter Date: 01/22/2021  Check In:  Session Check In - 01/22/21 1056      Check-In   Supervising physician immediately available to respond to emergencies See telemetry face sheet for immediately available ER MD    Location [facility]-Cardiac & Pulmonary Rehab    Staff Present Birdie Sons, MPA, Elveria Rising, BA, ACSM CEP, Exercise Physiologist;Kara Eliezer Bottom, MS Exercise Physiologist    Virtual Visit No    Medication changes reported     No    Fall or balance concerns reported    No    Tobacco Cessation Use Decreased    Current number of cigarettes/nicotine per day     -5    Warm-up and Cool-down Performed on first and last piece of equipment    Resistance Training Performed Yes    VAD Patient? No    PAD/SET Patient? No      Pain Assessment   Currently in Pain? No/denies              Social History   Tobacco Use  Smoking Status Current Every Day Smoker  . Packs/day: 1.00  . Years: 54.00  . Pack years: 54.00  . Types: Cigarettes  Smokeless Tobacco Never Used    Goals Met:  Independence with exercise equipment Exercise tolerated well No report of cardiac concerns or symptoms Strength training completed today  Goals Unmet:  Not Applicable  Comments: First full day of exercise!  Patient was oriented to gym and equipment including functions, settings, policies, and procedures.  Patient's individual exercise prescription and treatment plan were reviewed.  All starting workloads were established based on the results of the 6 minute walk test done at initial orientation visit.  The plan for exercise progression was also introduced and progression will be customized based on patient's performance and goals.    Dr. Emily Filbert is Medical Director for [facility].

## 2021-01-23 ENCOUNTER — Encounter: Payer: Self-pay | Admitting: *Deleted

## 2021-01-23 DIAGNOSIS — I5022 Chronic systolic (congestive) heart failure: Secondary | ICD-10-CM

## 2021-01-23 NOTE — Progress Notes (Signed)
Cardiac Individual Treatment Plan  Patient Details  Name: [name] MRN: [id number] Date of Birth: 12/24/1945 Referring Provider:   Flowsheet Row Pulmonary Rehab from 01/17/2021 in [facility] Cardiac and Pulmonary Rehab  Referring Provider Bartholome Bill MD      Initial Encounter Date:  Flowsheet Row Pulmonary Rehab from 01/17/2021 in [facility] Cardiac and Pulmonary Rehab  Date 01/17/21      Visit Diagnosis: Heart failure, chronic systolic ([place])  Patient's Home Medications on Admission:  Current Outpatient Medications:  .  acetaminophen (TYLENOL) 325 MG tablet, Take 2 tablets (650 mg total) by mouth every 6 (six) hours as needed for mild pain (or Fever >/= 101)., Disp: , Rfl:  .  albuterol (PROVENTIL HFA;VENTOLIN HFA) 108 (90 Base) MCG/ACT inhaler, Inhale 2 puffs into the lungs every 6 (six) hours as needed for wheezing or shortness of breath., Disp: , Rfl:  .  amLODipine (NORVASC) 5 MG tablet, Take 1 tablet (5 mg total) by mouth daily., Disp: 30 tablet, Rfl: 1 .  apixaban (ELIQUIS) 5 MG TABS tablet, Take 1 tablet (5 mg total) by mouth 2 (two) times daily., Disp: 180 tablet, Rfl: 3 .  bifidobacterium infantis (ALIGN) capsule, Take 1 capsule by mouth every morning., Disp: , Rfl:  .  cholecalciferol (VITAMIN D) 1000 units tablet, Take 1,000 Units by mouth daily., Disp: , Rfl:  .  Cinnamon 500 MG capsule, Take 1,000 mg by mouth 2 (two) times daily., Disp: , Rfl:  .  COMBIVENT RESPIMAT 20-100 MCG/ACT AERS respimat, Inhale 2 puffs into the lungs 4 (four) times daily as needed., Disp: , Rfl:  .  CRANBERRY EXTRACT PO, Take 500 mg by mouth every morning. , Disp: , Rfl:  .  Ensure Max Protein (ENSURE MAX PROTEIN) LIQD, Take 330 mLs (11 oz total) by mouth 2 (two) times daily between meals., Disp:  , Rfl:  .  gabapentin (NEURONTIN) 100 MG capsule, Take 100 mg by mouth 2 (two) times daily. (Patient not taking: Reported on 01/07/2021), Disp: , Rfl:  .  glucose 4 GM chewable tablet, Chew 1 tablet by mouth as  needed for low blood sugar., Disp: , Rfl:  .  hydrALAZINE (APRESOLINE) 50 MG tablet, Take 1 tablet (50 mg total) by mouth every 8 (eight) hours., Disp:  , Rfl:  .  insulin aspart (NOVOLOG) 100 UNIT/ML injection, 0-9 Units, Subcutaneous, 3 times daily with meals CBG < 70: Implement Hypoglycemia protocol/measures CBG 70 - 120: 0 units CBG 121 - 150: 1 unit CBG 151 - 200: 2 units CBG 201 - 250: 3 units CBG 251 - 300: 5 units CBG 301 - 350: 7 units CBG 351 - 400: 9 units CBG > 400: call MD (Patient taking differently: Inject 5 Units into the skin 3 (three) times daily with meals.), Disp: 10 mL, Rfl: 11 .  isosorbide mononitrate (IMDUR) 30 MG 24 hr tablet, Take 1 tablet (30 mg total) by mouth daily., Disp:  , Rfl:  .  lactase (LACTAID) 3000 units tablet, Take 3,000 Units by mouth 3 (three) times daily between meals as needed., Disp: , Rfl:  .  LANTUS SOLOSTAR 100 UNIT/ML Solostar Pen, Inject 45 Units into the skin daily at 10 pm., Disp: 15 mL, Rfl: 11 .  Magnesium 250 MG TABS, Take 250 mg by mouth daily., Disp: , Rfl:  .  Misc Natural Products (TOTAL MEMORY & FOCUS FORMULA PO), Take 1 capsule by mouth See admin instructions. Take 2 capsule at 9:00 AM, 1 capsule at 5:00 PM, and  1 capsule at bedtime. (Patient not taking: Reported on 01/07/2021), Disp: , Rfl:  .  Multiple Vitamin (MULTIVITAMIN WITH MINERALS) TABS tablet, Take 1 tablet by mouth daily., Disp: , Rfl:  .  NON FORMULARY, Take 1 Dose by mouth 3 (three) times daily. Relief Factor OTC, Disp: , Rfl:  .  omeprazole (PRILOSEC) 20 MG capsule, Take 20 mg by mouth daily., Disp: , Rfl:  .  potassium chloride (K-DUR) 10 MEQ tablet, Take 10 mEq by mouth daily., Disp: , Rfl:  .  simvastatin (ZOCOR) 20 MG tablet, Take 20 mg by mouth at bedtime., Disp: , Rfl:  .  SUPER B COMPLEX/C PO, Take 1 tablet by mouth every morning., Disp: , Rfl:  .  torsemide (DEMADEX) 20 MG tablet, Take 2 tablets (40 mg total) by mouth daily., Disp: 60 tablet, Rfl: 0 .  traZODone (DESYREL)  50 MG tablet, Take 50 mg by mouth at bedtime., Disp: , Rfl:  .  vitamin C (ASCORBIC ACID) 500 MG tablet, Take 500 mg by mouth 2 (two) times daily. , Disp: , Rfl:  No current facility-administered medications for this visit.  Facility-Administered Medications Ordered in Other Visits:  .  furosemide (LASIX) injection 80 mg, 80 mg, Intravenous, Once, Fath, Javier Docker, MD .  potassium chloride (KLOR-CON) CR tablet 40 mEq, 40 mEq, Oral, Once, Fath, Javier Docker, MD  Past Medical History: Past Medical History:  Diagnosis Date  . Arrhythmia    atrial fibrillation  . Atrial fibrillation ([place])   . Basal cell carcinoma 05/2019   right nasal ala, Tx: EDC  . Basal cell carcinoma 04/12/2019   Left posterior ear. Nodular pattern, excoriated.   . CHF (congestive heart failure) ([place])   . COPD (chronic obstructive pulmonary disease) ([place])   . Coronary artery disease   . Diabetes mellitus without complication ([place])   . GERD (gastroesophageal reflux disease)   . HLD (hyperlipidemia)   . Hypertension   . Peripheral vascular disease ([place])   . Pneumonia   . Prostate cancer ([place])   . Squamous cell carcinoma of skin 08/10/2018   Left upper arm above elbow. WD SCC.  Marland Kitchen Squamous cell carcinoma of skin 09/07/2018   Right forearm, below elbow. WD SCC. [facility] 01/10/2019.  Marland Kitchen Stroke [facility])     Tobacco Use: Social History   Tobacco Use  Smoking Status Current Every Day Smoker  . Packs/day: 1.00  . Years: 54.00  . Pack years: 54.00  . Types: Cigarettes  Smokeless Tobacco Never Used    Labs: Recent Review Flowsheet Data    Labs for ITP Cardiac and Pulmonary Rehab Latest Ref Rng & Units 10/12/2019 12/26/2019 10/23/2020 10/24/2020 10/24/2020   Hemoglobin A1c 4.8 - 5.6 % 8.9(H) 7.6(H) - 7.3(H) 7.6(H)   PHART 7.350 - 7.450 - 7.32(L) - - -   PCO2ART 32.0 - 48.0 mmHg - 44 - - -   HCO3 20.0 - 28.0 mmol/L - 22.7 34.4(H) - -   TCO2 0 - 100 mmol/L - - - - -   ACIDBASEDEF 0.0 - 2.0 mmol/L - 3.5(H) - - -   O2SAT % -  99.5 58.9 - -       Exercise Target Goals: Exercise Program Goal: Individual exercise prescription set using results from initial 6 min walk test and THRR while considering  patient's activity barriers and safety.   Exercise Prescription Goal: Initial exercise prescription builds to 30-45 minutes a day of aerobic activity, 2-3 days per week.  Home exercise guidelines will be  given to patient during program as part of exercise prescription that the participant will acknowledge.   Education: Aerobic Exercise: - Group verbal and visual presentation on the components of exercise prescription. Introduces F.I.T.T principle from ACSM for exercise prescriptions.  Reviews F.I.T.T. principles of aerobic exercise including progression. Written material given at graduation. Flowsheet Row Pulmonary Rehab from 01/17/2021 in [facility] Cardiac and Pulmonary Rehab  Education need identified 01/17/21      Education: Resistance Exercise: - Group verbal and visual presentation on the components of exercise prescription. Introduces F.I.T.T principle from ACSM for exercise prescriptions  Reviews F.I.T.T. principles of resistance exercise including progression. Written material given at graduation.    Education: Exercise & Equipment Safety: - Individual verbal instruction and demonstration of equipment use and safety with use of the equipment. Flowsheet Row Pulmonary Rehab from 01/17/2021 in [facility] Cardiac and Pulmonary Rehab  Date 01/17/21  Educator [facility]  Instruction Review Code 1- Verbalizes Understanding      Education: Exercise Physiology & General Exercise Guidelines: - Group verbal and written instruction with models to review the exercise physiology of the cardiovascular system and associated critical values. Provides general exercise guidelines with specific guidelines to those with heart or lung disease.    Education: Flexibility, Balance, Mind/Body Relaxation: - Group verbal and visual presentation  with interactive activity on the components of exercise prescription. Introduces F.I.T.T principle from ACSM for exercise prescriptions. Reviews F.I.T.T. principles of flexibility and balance exercise training including progression. Also discusses the mind body connection.  Reviews various relaxation techniques to help reduce and manage stress (i.e. Deep breathing, progressive muscle relaxation, and visualization). Balance handout provided to take home. Written material given at graduation.   Activity Barriers & Risk Stratification:  Activity Barriers & Cardiac Risk Stratification - 01/17/21 1036      Activity Barriers & Cardiac Risk Stratification   Activity Barriers Assistive Device;Balance Concerns;Shortness of Breath;Deconditioning;Muscular Weakness;History of Falls;Other (comment)    Comments residuals from stroke, double vision    Cardiac Risk Stratification Moderate           6 Minute Walk:  6 Minute Walk    Row Name 01/17/21 1034         6 Minute Walk   Phase --  NuStep Test     Distance 253 feet  steps     Walk Time 6 minutes     # of Rest Breaks 0     METS 1.1     RPE 13     Perceived Dyspnea  2     Symptoms Yes (comment)     Comments SOB, fatigue     Resting HR 65 bpm     Resting BP 154/64     Resting Oxygen Saturation  96 %     Exercise Oxygen Saturation  during 6 min walk 92 %     Max Ex. HR 72 bpm     Max Ex. BP 168/84     2 Minute Post BP 144/64           Interval HR   1 Minute HR 70     2 Minute HR 72     3 Minute HR 72     4 Minute HR 64     5 Minute HR 67     6 Minute HR 68     2 Minute Post HR 56     Interval Heart Rate? Yes           Interval Oxygen  Interval Oxygen? Yes     Baseline Oxygen Saturation % 96 %     1 Minute Oxygen Saturation % 93 %     1 Minute Liters of Oxygen 0 L  Room Air     2 Minute Oxygen Saturation % 92 %     2 Minute Liters of Oxygen 0 L     3 Minute Oxygen Saturation % 93 %     3 Minute Liters of Oxygen 0 L     4  Minute Oxygen Saturation % 98 %     4 Minute Liters of Oxygen 0 L     5 Minute Oxygen Saturation % 97 %     5 Minute Liters of Oxygen 0 L     6 Minute Oxygen Saturation % 97 %     6 Minute Liters of Oxygen 0 L     2 Minute Post Oxygen Saturation % 97 %     2 Minute Post Liters of Oxygen 0 L            Oxygen Initial Assessment:  Oxygen Initial Assessment - 01/07/21 1537      Home Oxygen   Home Oxygen Device None    Sleep Oxygen Prescription None    Home Exercise Oxygen Prescription None    Home Resting Oxygen Prescription None    Compliance with Home Oxygen Use Yes      Intervention   Short Term Goals To learn and demonstrate proper use of respiratory medications;To learn and demonstrate proper pursed lip breathing techniques or other breathing techniques.    Long  Term Goals Exhibits proper breathing techniques, such as pursed lip breathing or other method taught during program session;Compliance with respiratory medication           Oxygen Re-Evaluation:  Oxygen Re-Evaluation    Row Name 01/22/21 1115             Home Oxygen   Home Oxygen Device None       Sleep Oxygen Prescription None       Home Exercise Oxygen Prescription None       Home Resting Oxygen Prescription None               Goals/Expected Outcomes   Short Term Goals To learn and demonstrate proper pursed lip breathing techniques or other breathing techniques.;To learn and understand importance of monitoring SPO2 with pulse oximeter and demonstrate accurate use of the pulse oximeter.;To learn and understand importance of maintaining oxygen saturations>88%       Long  Term Goals Exhibits proper breathing techniques, such as pursed lip breathing or other method taught during program session;Verbalizes importance of monitoring SPO2 with pulse oximeter and return demonstration;Maintenance of O2 saturations>88%       Comments Reviewed PLB technique with pt.  Talked about how it works and it's importance in  maintaining their exercise saturations.       Goals/Expected Outcomes Short: Become more profiecient at using PLB.   Long: Become independent at using PLB.              Oxygen Discharge (Final Oxygen Re-Evaluation):  Oxygen Re-Evaluation - 01/22/21 1115      Home Oxygen   Home Oxygen Device None    Sleep Oxygen Prescription None    Home Exercise Oxygen Prescription None    Home Resting Oxygen Prescription None      Goals/Expected Outcomes   Short Term Goals To learn and demonstrate proper pursed lip breathing  techniques or other breathing techniques.;To learn and understand importance of monitoring SPO2 with pulse oximeter and demonstrate accurate use of the pulse oximeter.;To learn and understand importance of maintaining oxygen saturations>88%    Long  Term Goals Exhibits proper breathing techniques, such as pursed lip breathing or other method taught during program session;Verbalizes importance of monitoring SPO2 with pulse oximeter and return demonstration;Maintenance of O2 saturations>88%    Comments Reviewed PLB technique with pt.  Talked about how it works and it's importance in maintaining their exercise saturations.    Goals/Expected Outcomes Short: Become more profiecient at using PLB.   Long: Become independent at using PLB.           Initial Exercise Prescription:  Initial Exercise Prescription - 01/17/21 1000      Date of Initial Exercise RX and Referring Provider   Date 01/17/21    Referring Provider Bartholome Bill MD      Recumbant Bike   Level 1    RPM 60    Minutes 15    METs 1.2      NuStep   Level 1    SPM 60    Minutes 15    METs 1.2      Arm Ergometer   Level 1    RPM 25    Minutes 15    METs 1      Prescription Details   Frequency (times per week) 2    Duration Progress to 30 minutes of continuous aerobic without signs/symptoms of physical distress      Intensity   THRR 40-80% of Max Heartrate 97-129    Ratings of Perceived Exertion 11-13     Perceived Dyspnea 0-4      Progression   Progression Continue to progress workloads to maintain intensity without signs/symptoms of physical distress.      Resistance Training   Training Prescription Yes    Weight 3 lb    Reps 10-15           Perform Capillary Blood Glucose checks as needed.  Exercise Prescription Changes:  Exercise Prescription Changes    Row Name 01/17/21 1000 01/22/21 1300           Response to Exercise   Blood Pressure (Admit) 154/64 116/46      Blood Pressure (Exercise) 168/84 100/60      Blood Pressure (Exit) 124/64 110/54      Heart Rate (Admit) 65 bpm 70 bpm      Heart Rate (Exercise) 72 bpm 76 bpm      Heart Rate (Exit) 65 bpm 71 bpm      Oxygen Saturation (Admit) 96 % 94 %      Oxygen Saturation (Exercise) 92 % 92 %      Oxygen Saturation (Exit) 95 % --      Rating of Perceived Exertion (Exercise) 13 11      Perceived Dyspnea (Exercise) 2 1      Symptoms SOB, fatigue --      Comments NuStep test results first day      Duration -- Progress to 30 minutes of  aerobic without signs/symptoms of physical distress      Intensity -- THRR unchanged             Progression   Progression -- Continue to progress workloads to maintain intensity without signs/symptoms of physical distress.      Average METs -- 1.2  Resistance Training   Training Prescription -- Yes      Weight -- 3 lb      Reps -- 10-15             NuStep   Level -- 1      Minutes -- 15      METs -- 1.2             Exercise Comments:  Exercise Comments    Row Name 01/22/21 1114           Exercise Comments First full day of exercise!  Patient was oriented to gym and equipment including functions, settings, policies, and procedures.  Patient's individual exercise prescription and treatment plan were reviewed.  All starting workloads were established based on the results of the 6 minute walk test done at initial orientation visit.  The plan for exercise  progression was also introduced and progression will be customized based on patient's performance and goals.              Exercise Goals and Review:  Exercise Goals    Row Name 01/17/21 1040             Exercise Goals   Increase Physical Activity Yes       Intervention Provide advice, education, support and counseling about physical activity/exercise needs.;Develop an individualized exercise prescription for aerobic and resistive training based on initial evaluation findings, risk stratification, comorbidities and participant's personal goals.       Expected Outcomes Short Term: Attend rehab on a regular basis to increase amount of physical activity.;Long Term: Add in home exercise to make exercise part of routine and to increase amount of physical activity.;Long Term: Exercising regularly at least 3-5 days a week.       Increase Strength and Stamina Yes       Intervention Provide advice, education, support and counseling about physical activity/exercise needs.;Develop an individualized exercise prescription for aerobic and resistive training based on initial evaluation findings, risk stratification, comorbidities and participant's personal goals.       Expected Outcomes Short Term: Perform resistance training exercises routinely during rehab and add in resistance training at home;Long Term: Improve cardiorespiratory fitness, muscular endurance and strength as measured by increased METs and functional capacity (6MWT);Short Term: Increase workloads from initial exercise prescription for resistance, speed, and METs.       Able to understand and use rate of perceived exertion (RPE) scale Yes       Intervention Provide education and explanation on how to use RPE scale       Expected Outcomes Short Term: Able to use RPE daily in rehab to express subjective intensity level;Long Term:  Able to use RPE to guide intensity level when exercising independently       Able to understand and use Dyspnea  scale Yes       Intervention Provide education and explanation on how to use Dyspnea scale       Expected Outcomes Short Term: Able to use Dyspnea scale daily in rehab to express subjective sense of shortness of breath during exertion;Long Term: Able to use Dyspnea scale to guide intensity level when exercising independently       Knowledge and understanding of Target Heart Rate Range (THRR) Yes       Intervention Provide education and explanation of THRR including how the numbers were predicted and where they are located for reference       Expected Outcomes Short Term: Able to state/look  up THRR;Short Term: Able to use daily as guideline for intensity in rehab;Long Term: Able to use THRR to govern intensity when exercising independently       Able to check pulse independently Yes       Intervention Provide education and demonstration on how to check pulse in carotid and radial arteries.;Review the importance of being able to check your own pulse for safety during independent exercise       Expected Outcomes Short Term: Able to explain why pulse checking is important during independent exercise;Long Term: Able to check pulse independently and accurately       Understanding of Exercise Prescription Yes       Intervention Provide education, explanation, and written materials on patient's individual exercise prescription       Expected Outcomes Short Term: Able to explain program exercise prescription;Long Term: Able to explain home exercise prescription to exercise independently              Exercise Goals Re-Evaluation :  Exercise Goals Re-Evaluation    Row Name 01/22/21 1114             Exercise Goal Re-Evaluation   Exercise Goals Review Increase Physical Activity;Able to understand and use rate of perceived exertion (RPE) scale;Knowledge and understanding of Target Heart Rate Range (THRR);Understanding of Exercise Prescription;Increase Strength and Stamina;Able to understand and use  Dyspnea scale;Able to check pulse independently       Comments Reviewed RPE and dyspnea scales, THR and program prescription with pt today.  Pt voiced understanding and was given a copy of goals to take home.       Expected Outcomes Short: Use RPE daily to regulate intensity. Long: Follow program prescription in THR.              Discharge Exercise Prescription (Final Exercise Prescription Changes):  Exercise Prescription Changes - 01/22/21 1300      Response to Exercise   Blood Pressure (Admit) 116/46    Blood Pressure (Exercise) 100/60    Blood Pressure (Exit) 110/54    Heart Rate (Admit) 70 bpm    Heart Rate (Exercise) 76 bpm    Heart Rate (Exit) 71 bpm    Oxygen Saturation (Admit) 94 %    Oxygen Saturation (Exercise) 92 %    Rating of Perceived Exertion (Exercise) 11    Perceived Dyspnea (Exercise) 1    Comments first day    Duration Progress to 30 minutes of  aerobic without signs/symptoms of physical distress    Intensity THRR unchanged      Progression   Progression Continue to progress workloads to maintain intensity without signs/symptoms of physical distress.    Average METs 1.2      Resistance Training   Training Prescription Yes    Weight 3 lb    Reps 10-15      NuStep   Level 1    Minutes 15    METs 1.2           Nutrition:  Target Goals: Understanding of nutrition guidelines, daily intake of sodium 1500mg , cholesterol 200mg , calories 30% from fat and 7% or less from saturated fats, daily to have 5 or more servings of fruits and vegetables.  Education: All About Nutrition: -Group instruction provided by verbal, written material, interactive activities, discussions, models, and posters to present general guidelines for heart healthy nutrition including fat, fiber, MyPlate, the role of sodium in heart healthy nutrition, utilization of the nutrition label, and utilization of this knowledge  for meal planning. Follow up email sent as well. Written material  given at graduation. Flowsheet Row Pulmonary Rehab from 01/17/2021 in [facility] Cardiac and Pulmonary Rehab  Education need identified 01/17/21      Biometrics:  Pre Biometrics - 01/17/21 1040      Pre Biometrics   Height 5\' 6"  (1.676 m)    Weight 160 lb 4.8 oz (72.7 kg)    BMI (Calculated) 25.89            Nutrition Therapy Plan and Nutrition Goals:   Nutrition Assessments:  MEDIFICTS Score Key:  ?70 Need to make dietary changes   40-70 Heart Healthy Diet  ? 40 Therapeutic Level Cholesterol Diet  Flowsheet Row Pulmonary Rehab from 01/17/2021 in [facility] Cardiac and Pulmonary Rehab  Picture Your Plate Total Score on Admission 70     Picture Your Plate Scores:  <19 Unhealthy dietary pattern with much room for improvement.  41-50 Dietary pattern unlikely to meet recommendations for good health and room for improvement.  51-60 More healthful dietary pattern, with some room for improvement.   >60 Healthy dietary pattern, although there may be some specific behaviors that could be improved.    Nutrition Goals Re-Evaluation:   Nutrition Goals Discharge (Final Nutrition Goals Re-Evaluation):   Psychosocial: Target Goals: Acknowledge presence or absence of significant depression and/or stress, maximize coping skills, provide positive support system. Participant is able to verbalize types and ability to use techniques and skills needed for reducing stress and depression.   Education: Stress, Anxiety, and Depression - Group verbal and visual presentation to define topics covered.  Reviews how body is impacted by stress, anxiety, and depression.  Also discusses healthy ways to reduce stress and to treat/manage anxiety and depression.  Written material given at graduation.   Education: Sleep Hygiene -Provides group verbal and written instruction about how sleep can affect your health.  Define sleep hygiene, discuss sleep cycles and impact of sleep habits. Review good sleep  hygiene tips.    Initial Review & Psychosocial Screening:  Initial Psych Review & Screening - 01/07/21 1538      Initial Review   Current issues with None Identified      Family Dynamics   Good Support System? Yes   wife     Barriers   Psychosocial barriers to participate in program There are no identifiable barriers or psychosocial needs.      Screening Interventions   Interventions Encouraged to exercise;To provide support and resources with identified psychosocial needs;Provide feedback about the scores to participant    Expected Outcomes Short Term goal: Utilizing psychosocial counselor, staff and physician to assist with identification of specific Stressors or current issues interfering with healing process. Setting desired goal for each stressor or current issue identified.;Long Term Goal: Stressors or current issues are controlled or eliminated.;Short Term goal: Identification and review with participant of any Quality of Life or Depression concerns found by scoring the questionnaire.;Long Term goal: The participant improves quality of Life and PHQ9 Scores as seen by post scores and/or verbalization of changes           Quality of Life Scores:   Quality of Life - 01/17/21 1040      Quality of Life   Select Quality of Life      Quality of Life Scores   Health/Function Pre 15.23 %    Socioeconomic Pre 23.25 %    Psych/Spiritual Pre 22.36 %    Family Pre 24 %    GLOBAL  Pre 19.39 %          Scores of 19 and below usually indicate a poorer quality of life in these areas.  A difference of  2-3 points is a clinically meaningful difference.  A difference of 2-3 points in the total score of the Quality of Life Index has been associated with significant improvement in overall quality of life, self-image, physical symptoms, and general health in studies assessing change in quality of life.  PHQ-9: Recent Review Flowsheet Data    Depression screen [facility] 2/9 01/17/2021 08/26/2017  11/24/2016 01/09/2016 12/12/2015   Decreased Interest 0 0 0 0 0   Down, Depressed, Hopeless 0 0 0 0 0   PHQ - 2 Score 0 0 0 0 0   Altered sleeping 3 - - - -   Tired, decreased energy 3 - - - -   Change in appetite 0 - - - -   Feeling bad or failure about yourself  1 - - - -   Trouble concentrating 1 - - - -   Moving slowly or fidgety/restless 0 - - - -   Suicidal thoughts 0 - - - -   PHQ-9 Score 8 - - - -   Difficult doing work/chores Very difficult - - - -     Interpretation of Total Score  Total Score Depression Severity:  1-4 = Minimal depression, 5-9 = Mild depression, 10-14 = Moderate depression, 15-19 = Moderately severe depression, 20-27 = Severe depression   Psychosocial Evaluation and Intervention:  Psychosocial Evaluation - 01/07/21 1555      Psychosocial Evaluation & Interventions   Comments Vadim has no barriers to attending the program. He is ready to attend and hopes to see improved shortness of breath from the program. He lives with his wife, his only support, and their 2 cats. 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 He stated no issues with depression, stress nor anxiety.  He does depend on his wife for transportation after several mini strokes caused imbalance and some eye concerns.  He is ready to get started and should do well.           Psychosocial Re-Evaluation:   Psychosocial Discharge (Final Psychosocial Re-Evaluation):   Vocational Rehabilitation: Provide vocational rehab assistance to qualifying candidates.   Vocational Rehab Evaluation & Intervention:   Education: Education Goals: Education classes will be provided on a variety of topics geared toward better understanding of heart health and risk factor modification. Participant will state understanding/return demonstration of topics presented as noted by education test scores.  Learning Barriers/Preferences:   General Cardiac Education Topics:  AED/CPR: -  Group verbal and written instruction with the use of models to demonstrate the basic use of the AED with the basic ABC's of resuscitation.   Anatomy and Cardiac Procedures: - Group verbal and visual presentation and models provide information about basic cardiac anatomy and function. Reviews the testing methods done to diagnose heart disease and the outcomes of the test results. Describes the treatment choices: Medical Management, Angioplasty, or Coronary Bypass Surgery for treating various heart conditions including Myocardial Infarction, Angina, Valve Disease, and Cardiac Arrhythmias.  Written material given at graduation. Flowsheet Row Pulmonary Rehab from 01/17/2021 in [facility] Cardiac and Pulmonary Rehab  Education need identified 01/17/21      Medication Safety: - Group verbal and visual instruction to review commonly prescribed medications for heart and lung disease. Reviews the medication, class of the drug, and  side effects. Includes the steps to properly store meds and maintain the prescription regimen.  Written material given at graduation.   Intimacy: - Group verbal instruction through game format to discuss how heart and lung disease can affect sexual intimacy. Written material given at graduation..   Know Your Numbers and Heart Failure: - Group verbal and visual instruction to discuss disease risk factors for cardiac and pulmonary disease and treatment options.  Reviews associated critical values for Overweight/Obesity, Hypertension, Cholesterol, and Diabetes.  Discusses basics of heart failure: signs/symptoms and treatments.  Introduces Heart Failure Zone chart for action plan for heart failure.  Written material given at graduation.   Infection Prevention: - Provides verbal and written material to individual with discussion of infection control including proper hand washing and proper equipment cleaning during exercise session. Flowsheet Row Pulmonary Rehab from 01/17/2021 in [facility]  Cardiac and Pulmonary Rehab  Date 01/17/21  Educator [facility]  Instruction Review Code 1- Verbalizes Understanding      Falls Prevention: - Provides verbal and written material to individual with discussion of falls prevention and safety. Flowsheet Row Pulmonary Rehab from 01/17/2021 in [facility] Cardiac and Pulmonary Rehab  Date 01/17/21  Educator [facility]  Instruction Review Code 1- Verbalizes Understanding      Other: -Provides group and verbal instruction on various topics (see comments)   Knowledge Questionnaire Score:  Knowledge Questionnaire Score - 01/17/21 1042      Knowledge Questionnaire Score   Pre Score 23/26 Education Focus: nurtrition, exercise, angina           Core Components/Risk Factors/Patient Goals at Admission:  Personal Goals and Risk Factors at Admission - 01/17/21 1042      Core Components/Risk Factors/Patient Goals on Admission    Weight Management Yes;Weight Loss    Intervention Weight Management: Develop a combined nutrition and exercise program designed to reach desired caloric intake, while maintaining appropriate intake of nutrient and fiber, sodium and fats, and appropriate energy expenditure required for the weight goal.;Weight Management: Provide education and appropriate resources to help participant work on and attain dietary goals.;Weight Management/Obesity: Establish reasonable short term and long term weight goals.    Admit Weight 160 lb 4.8 oz (72.7 kg)    Goal Weight: Short Term 157 lb (71.2 kg)    Goal Weight: Long Term 155 lb (70.3 kg)    Expected Outcomes Short Term: Continue to assess and modify interventions until short term weight is achieved;Long Term: Adherence to nutrition and physical activity/exercise program aimed toward attainment of established weight goal;Weight Maintenance: Understanding of the daily nutrition guidelines, which includes 25-35% calories from fat, 7% or less cal from saturated fats, less than 200mg  cholesterol, less than  1.5gm of sodium, & 5 or more servings of fruits and vegetables daily    Tobacco Cessation Yes    Number of packs per day Phillipe is a current tobacco user. Intervention for tobacco cessation was provided at the initial medical review. He was asked about readiness to quit and reported he is not ready to quit at this time . Patient was advised and educated about tobacco cessation using combination therapy, tobacco cessation classes, quit line, and quit smoking apps. Patient demonstrated understanding of this material. Staff will continue to provide encouragement and follow up with the patient throughout the program.    Intervention Assist the participant in steps to quit. Provide individualized education and counseling about committing to Tobacco Cessation, relapse prevention, and pharmacological support that can be provided by physician.;Advice worker, assist  with locating and accessing local/national Quit Smoking programs, and support quit date choice.    Expected Outcomes Short Term: Will demonstrate readiness to quit, by selecting a quit date.;Short Term: Will quit all tobacco product use, adhering to prevention of relapse plan.;Long Term: Complete abstinence from all tobacco products for at least 12 months from quit date.    Diabetes Yes    Intervention Provide education about signs/symptoms and action to take for hypo/hyperglycemia.;Provide education about proper nutrition, including hydration, and aerobic/resistive exercise prescription along with prescribed medications to achieve blood glucose in normal ranges: Fasting glucose 65-99 mg/dL    Expected Outcomes Short Term: Participant verbalizes understanding of the signs/symptoms and immediate care of hyper/hypoglycemia, proper foot care and importance of medication, aerobic/resistive exercise and nutrition plan for blood glucose control.;Long Term: Attainment of HbA1C < 7%.    Heart Failure Yes    Intervention Provide a combined exercise  and nutrition program that is supplemented with education, support and counseling about heart failure. Directed toward relieving symptoms such as shortness of breath, decreased exercise tolerance, and extremity edema.    Expected Outcomes Improve functional capacity of life;Short term: Attendance in program 2-3 days a week with increased exercise capacity. Reported lower sodium intake. Reported increased fruit and vegetable intake. Reports medication compliance.;Short term: Daily weights obtained and reported for increase. Utilizing diuretic protocols set by physician.;Long term: Adoption of self-care skills and reduction of barriers for early signs and symptoms recognition and intervention leading to self-care maintenance.    Hypertension Yes    Intervention Provide education on lifestyle modifcations including regular physical activity/exercise, weight management, moderate sodium restriction and increased consumption of fresh fruit, vegetables, and low fat dairy, alcohol moderation, and smoking cessation.;Monitor prescription use compliance.    Expected Outcomes Short Term: Continued assessment and intervention until BP is < 140/7mm HG in hypertensive participants. < 130/42mm HG in hypertensive participants with diabetes, heart failure or chronic kidney disease.;Long Term: Maintenance of blood pressure at goal levels.    Lipids Yes    Intervention Provide education and support for participant on nutrition & aerobic/resistive exercise along with prescribed medications to achieve LDL 70mg , HDL >40mg .    Expected Outcomes Short Term: Participant states understanding of desired cholesterol values and is compliant with medications prescribed. Participant is following exercise prescription and nutrition guidelines.;Long Term: Cholesterol controlled with medications as prescribed, with individualized exercise RX and with personalized nutrition plan. Value goals: LDL < 70mg , HDL > 40 mg.            Education:Diabetes - Individual verbal and written instruction to review signs/symptoms of diabetes, desired ranges of glucose level fasting, after meals and with exercise. Acknowledge that pre and post exercise glucose checks will be done for 3 sessions at entry of program. Flowsheet Row Pulmonary Rehab from 01/17/2021 in [facility] Cardiac and Pulmonary Rehab  Date 01/17/21  Educator [facility]  Instruction Review Code 1- Verbalizes Understanding      Core Components/Risk Factors/Patient Goals Review:    Core Components/Risk Factors/Patient Goals at Discharge (Final Review):    ITP Comments:  ITP Comments    Row Name 01/07/21 1553 01/17/21 1034 01/22/21 1114 01/23/21 1120     ITP Comments Virtual orientation call completed today. he has an appointment on Date: 01/17/2021 for EP eval and gym Orientation.  Documentation of diagnosis can be found in [facility]  Date: 12/19/2020.   Barrie is a current tobacco user. Intervention for tobacco cessation was provided at the initial medical review. He was asked  about readiness to quit and reported he is not ready to quit at this time . Patient was advised and educated about tobacco cessation using combination therapy, tobacco cessation classes, quit line, and quit smoking apps. Patient demonstrated understanding of this material. Staff will continue to provide encouragement and follow up with the patient throughout the program. Completed 6MWT and gym orientation. Initial ITP created and sent for review to Dr. Emily Filbert, Medical Director. First full day of exercise!  Patient was oriented to gym and equipment including functions, settings, policies, and procedures.  Patient's individual exercise prescription and treatment plan were reviewed.  All starting workloads were established based on the results of the 6 minute walk test done at initial orientation visit.  The plan for exercise progression was also introduced and progression will be customized based on patient's  performance and goals. 30 Day review completed. Medical Director ITP review done, changes made as directed, and signed approval by Medical Director.  New to program           Comments:

## 2021-01-24 ENCOUNTER — Other Ambulatory Visit: Payer: Self-pay

## 2021-01-24 DIAGNOSIS — I5022 Chronic systolic (congestive) heart failure: Secondary | ICD-10-CM

## 2021-01-24 LAB — GLUCOSE, CAPILLARY
Glucose-Capillary: 103 mg/dL — ABNORMAL HIGH (ref 70–99)
Glucose-Capillary: 141 mg/dL — ABNORMAL HIGH (ref 70–99)

## 2021-01-24 NOTE — Progress Notes (Signed)
Daily Session Note  Patient Details  Name: [name] MRN: [id number] Date of Birth: 1946-03-29 Referring Provider:   Flowsheet Row Pulmonary Rehab from 01/17/2021 in [facility] Cardiac and Pulmonary Rehab  Referring Provider Bartholome Bill MD      Encounter Date: 01/24/2021  Check In:  Session Check In - 01/24/21 1039      Check-In   Supervising physician immediately available to respond to emergencies See telemetry face sheet for immediately available ER MD    Location [facility]-Cardiac & Pulmonary Rehab    Staff Present Birdie Sons, MPA, RN;Amanda Sommer, BA, ACSM CEP, Exercise Physiologist;Melissa Caiola RDN, LDN    Virtual Visit No    Medication changes reported     No    Fall or balance concerns reported    No    Tobacco Cessation No Change    Warm-up and Cool-down Performed on first and last piece of equipment    Resistance Training Performed Yes    VAD Patient? No    PAD/SET Patient? No      Pain Assessment   Currently in Pain? No/denies              Social History   Tobacco Use  Smoking Status Current Every Day Smoker  . Packs/day: 1.00  . Years: 54.00  . Pack years: 54.00  . Types: Cigarettes  Smokeless Tobacco Never Used    Goals Met:  Independence with exercise equipment Exercise tolerated well No report of cardiac concerns or symptoms Strength training completed today  Goals Unmet:  Not Applicable  Comments: Pt able to follow exercise prescription today without complaint.  Will continue to monitor for progression.    Dr. Emily Filbert is Medical Director for [facility] and [facility].

## 2021-01-28 ENCOUNTER — Telehealth: Payer: Self-pay

## 2021-01-28 ENCOUNTER — Other Ambulatory Visit: Payer: Self-pay

## 2021-01-28 ENCOUNTER — Emergency Department: Payer: PPO

## 2021-01-28 ENCOUNTER — Inpatient Hospital Stay
Admission: EM | Admit: 2021-01-28 | Discharge: 2021-02-05 | DRG: 190 | Disposition: A | Discharge status: Home / Self Care | Payer: PPO | Attending: Hospitalist | Admitting: Hospitalist

## 2021-01-28 DIAGNOSIS — Z8673 Personal history of transient ischemic attack (TIA), and cerebral infarction without residual deficits: Secondary | ICD-10-CM

## 2021-01-28 DIAGNOSIS — I10 Essential (primary) hypertension: Secondary | ICD-10-CM

## 2021-01-28 DIAGNOSIS — E739 Lactose intolerance, unspecified: Secondary | ICD-10-CM | POA: Diagnosis not present

## 2021-01-28 DIAGNOSIS — I5043 Acute on chronic combined systolic (congestive) and diastolic (congestive) heart failure: Secondary | ICD-10-CM | POA: Diagnosis not present

## 2021-01-28 DIAGNOSIS — Z8616 Personal history of COVID-19: Secondary | ICD-10-CM

## 2021-01-28 DIAGNOSIS — R0602 Shortness of breath: Secondary | ICD-10-CM | POA: Diagnosis not present

## 2021-01-28 DIAGNOSIS — J441 Chronic obstructive pulmonary disease with (acute) exacerbation: Secondary | ICD-10-CM

## 2021-01-28 DIAGNOSIS — Z20822 Contact with and (suspected) exposure to covid-19: Secondary | ICD-10-CM | POA: Diagnosis present

## 2021-01-28 DIAGNOSIS — E11649 Type 2 diabetes mellitus with hypoglycemia without coma: Secondary | ICD-10-CM | POA: Diagnosis not present

## 2021-01-28 DIAGNOSIS — E1142 Type 2 diabetes mellitus with diabetic polyneuropathy: Secondary | ICD-10-CM | POA: Diagnosis not present

## 2021-01-28 DIAGNOSIS — J432 Centrilobular emphysema: Secondary | ICD-10-CM | POA: Diagnosis not present

## 2021-01-28 DIAGNOSIS — E785 Hyperlipidemia, unspecified: Secondary | ICD-10-CM | POA: Diagnosis not present

## 2021-01-28 DIAGNOSIS — J9601 Acute respiratory failure with hypoxia: Secondary | ICD-10-CM | POA: Diagnosis not present

## 2021-01-28 DIAGNOSIS — I482 Chronic atrial fibrillation, unspecified: Secondary | ICD-10-CM | POA: Diagnosis not present

## 2021-01-28 DIAGNOSIS — I5032 Chronic diastolic (congestive) heart failure: Secondary | ICD-10-CM | POA: Diagnosis present

## 2021-01-28 DIAGNOSIS — Z794 Long term (current) use of insulin: Secondary | ICD-10-CM

## 2021-01-28 DIAGNOSIS — I5033 Acute on chronic diastolic (congestive) heart failure: Secondary | ICD-10-CM | POA: Diagnosis present

## 2021-01-28 DIAGNOSIS — N179 Acute kidney failure, unspecified: Secondary | ICD-10-CM

## 2021-01-28 DIAGNOSIS — I509 Heart failure, unspecified: Secondary | ICD-10-CM | POA: Diagnosis not present

## 2021-01-28 DIAGNOSIS — N178 Other acute kidney failure: Secondary | ICD-10-CM | POA: Diagnosis not present

## 2021-01-28 DIAGNOSIS — Z79899 Other long term (current) drug therapy: Secondary | ICD-10-CM

## 2021-01-28 DIAGNOSIS — Z7901 Long term (current) use of anticoagulants: Secondary | ICD-10-CM

## 2021-01-28 DIAGNOSIS — Z8249 Family history of ischemic heart disease and other diseases of the circulatory system: Secondary | ICD-10-CM

## 2021-01-28 DIAGNOSIS — T380X5A Adverse effect of glucocorticoids and synthetic analogues, initial encounter: Secondary | ICD-10-CM | POA: Diagnosis not present

## 2021-01-28 DIAGNOSIS — F17210 Nicotine dependence, cigarettes, uncomplicated: Secondary | ICD-10-CM | POA: Diagnosis not present

## 2021-01-28 DIAGNOSIS — R0600 Dyspnea, unspecified: Secondary | ICD-10-CM

## 2021-01-28 DIAGNOSIS — F419 Anxiety disorder, unspecified: Secondary | ICD-10-CM | POA: Diagnosis present

## 2021-01-28 DIAGNOSIS — K219 Gastro-esophageal reflux disease without esophagitis: Secondary | ICD-10-CM | POA: Diagnosis not present

## 2021-01-28 DIAGNOSIS — I110 Hypertensive heart disease with heart failure: Secondary | ICD-10-CM | POA: Diagnosis not present

## 2021-01-28 DIAGNOSIS — Z85828 Personal history of other malignant neoplasm of skin: Secondary | ICD-10-CM

## 2021-01-28 DIAGNOSIS — J9600 Acute respiratory failure, unspecified whether with hypoxia or hypercapnia: Secondary | ICD-10-CM | POA: Insufficient documentation

## 2021-01-28 DIAGNOSIS — J80 Acute respiratory distress syndrome: Secondary | ICD-10-CM | POA: Diagnosis not present

## 2021-01-28 DIAGNOSIS — I2510 Atherosclerotic heart disease of native coronary artery without angina pectoris: Secondary | ICD-10-CM | POA: Diagnosis not present

## 2021-01-28 DIAGNOSIS — E876 Hypokalemia: Secondary | ICD-10-CM | POA: Diagnosis not present

## 2021-01-28 DIAGNOSIS — R0902 Hypoxemia: Secondary | ICD-10-CM | POA: Diagnosis not present

## 2021-01-28 DIAGNOSIS — I517 Cardiomegaly: Secondary | ICD-10-CM | POA: Diagnosis not present

## 2021-01-28 DIAGNOSIS — I130 Hypertensive heart and chronic kidney disease with heart failure and stage 1 through stage 4 chronic kidney disease, or unspecified chronic kidney disease: Secondary | ICD-10-CM | POA: Diagnosis present

## 2021-01-28 DIAGNOSIS — I5022 Chronic systolic (congestive) heart failure: Secondary | ICD-10-CM | POA: Diagnosis not present

## 2021-01-28 DIAGNOSIS — E1151 Type 2 diabetes mellitus with diabetic peripheral angiopathy without gangrene: Secondary | ICD-10-CM | POA: Diagnosis not present

## 2021-01-28 DIAGNOSIS — E1165 Type 2 diabetes mellitus with hyperglycemia: Secondary | ICD-10-CM | POA: Diagnosis present

## 2021-01-28 DIAGNOSIS — J9690 Respiratory failure, unspecified, unspecified whether with hypoxia or hypercapnia: Secondary | ICD-10-CM | POA: Diagnosis not present

## 2021-01-28 DIAGNOSIS — D509 Iron deficiency anemia, unspecified: Secondary | ICD-10-CM | POA: Diagnosis not present

## 2021-01-28 DIAGNOSIS — E1122 Type 2 diabetes mellitus with diabetic chronic kidney disease: Secondary | ICD-10-CM | POA: Diagnosis not present

## 2021-01-28 DIAGNOSIS — R0689 Other abnormalities of breathing: Secondary | ICD-10-CM | POA: Diagnosis not present

## 2021-01-28 DIAGNOSIS — J90 Pleural effusion, not elsewhere classified: Secondary | ICD-10-CM | POA: Diagnosis not present

## 2021-01-28 DIAGNOSIS — J9811 Atelectasis: Secondary | ICD-10-CM | POA: Diagnosis not present

## 2021-01-28 DIAGNOSIS — R000 Tachycardia, unspecified: Secondary | ICD-10-CM | POA: Diagnosis not present

## 2021-01-28 DIAGNOSIS — R069 Unspecified abnormalities of breathing: Secondary | ICD-10-CM | POA: Diagnosis not present

## 2021-01-28 DIAGNOSIS — N1832 Chronic kidney disease, stage 3b: Secondary | ICD-10-CM | POA: Diagnosis not present

## 2021-01-28 DIAGNOSIS — Z951 Presence of aortocoronary bypass graft: Secondary | ICD-10-CM

## 2021-01-28 LAB — COMPREHENSIVE METABOLIC PANEL
ALT: 16 U/L (ref 0–44)
AST: 22 U/L (ref 15–41)
Albumin: 3.6 g/dL (ref 3.5–5.0)
Alkaline Phosphatase: 117 U/L (ref 38–126)
Anion gap: 12 (ref 5–15)
BUN: 28 mg/dL — ABNORMAL HIGH (ref 8–23)
CO2: 25 mmol/L (ref 22–32)
Calcium: 8.9 mg/dL (ref 8.9–10.3)
Chloride: 98 mmol/L (ref 98–111)
Creatinine, Ser: 2.11 mg/dL — ABNORMAL HIGH (ref 0.61–1.24)
GFR, Estimated: 32 mL/min — ABNORMAL LOW (ref >60)
Glucose, Bld: 334 mg/dL — ABNORMAL HIGH (ref 70–99)
Potassium: 3.8 mmol/L (ref 3.5–5.1)
Sodium: 135 mmol/L (ref 135–145)
Total Bilirubin: 1 mg/dL (ref 0.3–1.2)
Total Protein: 7.3 g/dL (ref 6.5–8.1)

## 2021-01-28 LAB — CBC WITH DIFFERENTIAL/PLATELET
Abs Immature Granulocytes: 0.05 10*3/uL (ref 0.00–0.07)
Basophils Absolute: 0 10*3/uL (ref 0.0–0.1)
Basophils Relative: 0 %
Eosinophils Absolute: 0 10*3/uL (ref 0.0–0.5)
Eosinophils Relative: 0 %
HCT: 32.9 % — ABNORMAL LOW (ref 39.0–52.0)
Hemoglobin: 9.9 g/dL — ABNORMAL LOW (ref 13.0–17.0)
Immature Granulocytes: 0 %
Lymphocytes Relative: 12 %
Lymphs Abs: 1.9 10*3/uL (ref 0.7–4.0)
MCH: 22.8 pg — ABNORMAL LOW (ref 26.0–34.0)
MCHC: 30.1 g/dL (ref 30.0–36.0)
MCV: 75.8 fL — ABNORMAL LOW (ref 80.0–100.0)
Monocytes Absolute: 1.1 10*3/uL — ABNORMAL HIGH (ref 0.1–1.0)
Monocytes Relative: 7 %
Neutro Abs: 12.4 10*3/uL — ABNORMAL HIGH (ref 1.7–7.7)
Neutrophils Relative %: 81 %
Platelets: 336 10*3/uL (ref 150–400)
RBC: 4.34 MIL/uL (ref 4.22–5.81)
RDW: 18.1 % — ABNORMAL HIGH (ref 11.5–15.5)
WBC: 15.4 10*3/uL — ABNORMAL HIGH (ref 4.0–10.5)
nRBC: 0 % (ref 0.0–0.2)

## 2021-01-28 LAB — RESP PANEL BY RT-PCR (FLU A&B, COVID) ARPGX2
Influenza A by PCR: NEGATIVE
Influenza B by PCR: NEGATIVE
SARS Coronavirus 2 by RT PCR: NEGATIVE

## 2021-01-28 LAB — IRON AND TIBC
Iron: 25 ug/dL — ABNORMAL LOW (ref 45–182)
Saturation Ratios: 6 % — ABNORMAL LOW (ref 17.9–39.5)
TIBC: 456 ug/dL — ABNORMAL HIGH (ref 250–450)
UIBC: 431 ug/dL

## 2021-01-28 LAB — GLUCOSE, CAPILLARY
Glucose-Capillary: 541 mg/dL (ref 70–99)
Glucose-Capillary: 432 mg/dL — ABNORMAL HIGH (ref 70–99)
Glucose-Capillary: 516 mg/dL (ref 70–99)
Glucose-Capillary: 266 mg/dL — ABNORMAL HIGH (ref 70–99)
Glucose-Capillary: 297 mg/dL — ABNORMAL HIGH (ref 70–99)

## 2021-01-28 LAB — BRAIN NATRIURETIC PEPTIDE: B Natriuretic Peptide: 1082.4 pg/mL — ABNORMAL HIGH (ref 0.0–100.0)

## 2021-01-28 LAB — MAGNESIUM: Magnesium: 2.4 mg/dL (ref 1.7–2.4)

## 2021-01-28 LAB — HEMOGLOBIN A1C
Hgb A1c MFr Bld: 6.8 % — ABNORMAL HIGH (ref 4.8–5.6)
Mean Plasma Glucose: 148.46 mg/dL

## 2021-01-28 LAB — TROPONIN I (HIGH SENSITIVITY)
Troponin I (High Sensitivity): 33 ng/L — ABNORMAL HIGH (ref <18)
Troponin I (High Sensitivity): 42 ng/L — ABNORMAL HIGH (ref <18)

## 2021-01-28 LAB — VITAMIN B12: Vitamin B-12: 309 pg/mL (ref 180–914)

## 2021-01-28 LAB — STREP PNEUMONIAE URINARY ANTIGEN: Strep Pneumo Urinary Antigen: NEGATIVE

## 2021-01-28 MED ORDER — INSULIN ASPART 100 UNIT/ML ~~LOC~~ SOLN
0.0000 [IU] | Freq: Every day | SUBCUTANEOUS | Status: DC
  Administered 2021-01-28: 3 [IU] via SUBCUTANEOUS
  Filled 2021-01-28: qty 1

## 2021-01-28 MED ORDER — MAGNESIUM OXIDE 400 (241.3 MG) MG PO TABS
200.0000 mg | ORAL_TABLET | Freq: Every day | ORAL | Status: DC
  Administered 2021-01-28 – 2021-01-30 (×3): 200 mg via ORAL
  Filled 2021-01-28 (×3): qty 1

## 2021-01-28 MED ORDER — PREDNISONE 20 MG PO TABS
20.0000 mg | ORAL_TABLET | Freq: Every day | ORAL | Status: DC
  Administered 2021-01-29 – 2021-01-31 (×3): 20 mg via ORAL
  Filled 2021-01-28 (×3): qty 1

## 2021-01-28 MED ORDER — LACTASE 3000 UNITS PO TABS
3000.0000 [IU] | ORAL_TABLET | Freq: Three times a day (TID) | ORAL | Status: DC
  Administered 2021-01-28 – 2021-02-05 (×24): 3000 [IU] via ORAL
  Filled 2021-01-28 (×29): qty 1

## 2021-01-28 MED ORDER — ONDANSETRON HCL 4 MG/2ML IJ SOLN: 4.0000 mg | Freq: Four times a day (QID) | INTRAMUSCULAR | Status: DC | PRN

## 2021-01-28 MED ORDER — INSULIN ASPART 100 UNIT/ML ~~LOC~~ SOLN
20.0000 [IU] | Freq: Once | SUBCUTANEOUS | Status: AC
  Administered 2021-01-28: 20 [IU] via SUBCUTANEOUS
  Filled 2021-01-28: qty 1

## 2021-01-28 MED ORDER — GLUCOSE 4 G PO CHEW
1.0000 | CHEWABLE_TABLET | ORAL | Status: DC | PRN
  Filled 2021-01-28: qty 1

## 2021-01-28 MED ORDER — ENSURE MAX PROTEIN PO LIQD
11.0000 [oz_av] | Freq: Two times a day (BID) | ORAL | Status: DC
  Administered 2021-01-28 – 2021-02-05 (×3): 11 [oz_av] via ORAL
  Filled 2021-01-28: qty 330

## 2021-01-28 MED ORDER — HYDRALAZINE HCL 50 MG PO TABS
50.0000 mg | ORAL_TABLET | Freq: Three times a day (TID) | ORAL | Status: DC
  Administered 2021-01-28 – 2021-02-04 (×21): 50 mg via ORAL
  Filled 2021-01-28 (×21): qty 1

## 2021-01-28 MED ORDER — APIXABAN 5 MG PO TABS
5.0000 mg | ORAL_TABLET | Freq: Two times a day (BID) | ORAL | Status: DC
  Administered 2021-01-28 – 2021-02-05 (×17): 5 mg via ORAL
  Filled 2021-01-28 (×17): qty 1

## 2021-01-28 MED ORDER — ENOXAPARIN SODIUM 40 MG/0.4ML ~~LOC~~ SOLN: 40.0000 mg | SUBCUTANEOUS | Status: DC

## 2021-01-28 MED ORDER — POTASSIUM CHLORIDE CRYS ER 10 MEQ PO TBCR
10.0000 meq | EXTENDED_RELEASE_TABLET | Freq: Every day | ORAL | Status: DC
  Administered 2021-01-28 – 2021-02-05 (×9): 10 meq via ORAL
  Filled 2021-01-28 (×9): qty 1

## 2021-01-28 MED ORDER — ONDANSETRON HCL 4 MG PO TABS: 4.0000 mg | ORAL_TABLET | Freq: Four times a day (QID) | ORAL | Status: DC | PRN

## 2021-01-28 MED ORDER — SODIUM CHLORIDE 0.9 % IV SOLN: INTRAVENOUS | Status: DC

## 2021-01-28 MED ORDER — ACETAMINOPHEN 325 MG PO TABS: 650.0000 mg | ORAL_TABLET | Freq: Four times a day (QID) | ORAL | Status: DC | PRN

## 2021-01-28 MED ORDER — IPRATROPIUM-ALBUTEROL 0.5-2.5 (3) MG/3ML IN SOLN: 3.0000 mL | Freq: Four times a day (QID) | RESPIRATORY_TRACT | Status: DC

## 2021-01-28 MED ORDER — IPRATROPIUM-ALBUTEROL 0.5-2.5 (3) MG/3ML IN SOLN
3.0000 mL | Freq: Four times a day (QID) | RESPIRATORY_TRACT | Status: DC
  Administered 2021-01-28 – 2021-01-29 (×5): 3 mL via RESPIRATORY_TRACT
  Filled 2021-01-28 (×5): qty 3

## 2021-01-28 MED ORDER — CINNAMON 500 MG PO CAPS: 1000.0000 mg | ORAL_CAPSULE | Freq: Two times a day (BID) | ORAL | Status: DC

## 2021-01-28 MED ORDER — TRAZODONE HCL 50 MG PO TABS
50.0000 mg | ORAL_TABLET | Freq: Every day | ORAL | Status: DC
  Administered 2021-01-28 – 2021-02-04 (×8): 50 mg via ORAL
  Filled 2021-01-28 (×8): qty 1

## 2021-01-28 MED ORDER — ASCORBIC ACID 500 MG PO TABS
500.0000 mg | ORAL_TABLET | Freq: Two times a day (BID) | ORAL | Status: DC
  Administered 2021-01-28 – 2021-02-05 (×17): 500 mg via ORAL
  Filled 2021-01-28 (×17): qty 1

## 2021-01-28 MED ORDER — DM-GUAIFENESIN ER 30-600 MG PO TB12
1.0000 | ORAL_TABLET | Freq: Two times a day (BID) | ORAL | Status: DC | PRN
  Filled 2021-01-28: qty 1

## 2021-01-28 MED ORDER — TRAZODONE HCL 50 MG PO TABS: 25.0000 mg | ORAL_TABLET | Freq: Every evening | ORAL | Status: DC | PRN

## 2021-01-28 MED ORDER — TORSEMIDE 20 MG PO TABS
40.0000 mg | ORAL_TABLET | Freq: Every day | ORAL | Status: DC
  Filled 2021-01-28: qty 2

## 2021-01-28 MED ORDER — METHYLPREDNISOLONE SODIUM SUCC 40 MG IJ SOLR
40.0000 mg | Freq: Four times a day (QID) | INTRAMUSCULAR | Status: DC
Start: 2021-01-28 — End: 2021-01-28
  Administered 2021-01-28 (×2): 40 mg via INTRAVENOUS
  Filled 2021-01-28 (×2): qty 1

## 2021-01-28 MED ORDER — MAGNESIUM HYDROXIDE 400 MG/5ML PO SUSP
30.0000 mL | Freq: Every day | ORAL | Status: DC | PRN
  Filled 2021-01-28: qty 30

## 2021-01-28 MED ORDER — METHYLPREDNISOLONE SODIUM SUCC 40 MG IJ SOLR: 40.0000 mg | Freq: Two times a day (BID) | INTRAMUSCULAR | Status: DC

## 2021-01-28 MED ORDER — SODIUM CHLORIDE 0.9 % IV SOLN
1.0000 g | INTRAVENOUS | Status: DC
  Administered 2021-01-28 – 2021-01-30 (×3): 1 g via INTRAVENOUS
  Filled 2021-01-28: qty 1
  Filled 2021-01-28: qty 10
  Filled 2021-01-28: qty 1
  Filled 2021-01-28: qty 10

## 2021-01-28 MED ORDER — ISOSORBIDE MONONITRATE ER 30 MG PO TB24
30.0000 mg | ORAL_TABLET | Freq: Every day | ORAL | Status: DC
  Administered 2021-01-28 – 2021-02-05 (×9): 30 mg via ORAL
  Filled 2021-01-28 (×9): qty 1

## 2021-01-28 MED ORDER — SIMVASTATIN 20 MG PO TABS
20.0000 mg | ORAL_TABLET | Freq: Every day | ORAL | Status: DC
  Administered 2021-01-28 – 2021-02-04 (×8): 20 mg via ORAL
  Filled 2021-01-28 (×8): qty 1

## 2021-01-28 MED ORDER — ADULT MULTIVITAMIN W/MINERALS CH
1.0000 | ORAL_TABLET | Freq: Every day | ORAL | Status: DC
  Administered 2021-01-28 – 2021-02-05 (×9): 1 via ORAL
  Filled 2021-01-28 (×9): qty 1

## 2021-01-28 MED ORDER — IPRATROPIUM-ALBUTEROL 0.5-2.5 (3) MG/3ML IN SOLN
3.0000 mL | Freq: Once | RESPIRATORY_TRACT | Status: AC
  Administered 2021-01-28: 3 mL via RESPIRATORY_TRACT
  Filled 2021-01-28: qty 3

## 2021-01-28 MED ORDER — INSULIN ASPART 100 UNIT/ML ~~LOC~~ SOLN
0.0000 [IU] | Freq: Three times a day (TID) | SUBCUTANEOUS | Status: DC
  Administered 2021-01-29: 2 [IU] via SUBCUTANEOUS
  Administered 2021-01-29 (×2): 3 [IU] via SUBCUTANEOUS
  Administered 2021-01-30: 8 [IU] via SUBCUTANEOUS
  Administered 2021-01-31 (×2): 3 [IU] via SUBCUTANEOUS
  Administered 2021-02-01: 11:00:00 5 [IU] via SUBCUTANEOUS
  Administered 2021-02-01: 18:00:00 11 [IU] via SUBCUTANEOUS
  Administered 2021-02-02: 5 [IU] via SUBCUTANEOUS
  Administered 2021-02-02: 17:00:00 2 [IU] via SUBCUTANEOUS
  Administered 2021-02-02: 5 [IU] via SUBCUTANEOUS
  Administered 2021-02-03: 14:00:00 3 [IU] via SUBCUTANEOUS
  Administered 2021-02-03: 10:00:00 5 [IU] via SUBCUTANEOUS
  Administered 2021-02-03: 19:00:00 11 [IU] via SUBCUTANEOUS
  Administered 2021-02-04: 5 [IU] via SUBCUTANEOUS
  Administered 2021-02-04 (×2): 8 [IU] via SUBCUTANEOUS
  Administered 2021-02-05: 5 [IU] via SUBCUTANEOUS
  Administered 2021-02-05: 09:00:00 3 [IU] via SUBCUTANEOUS
  Filled 2021-01-28 (×19): qty 1

## 2021-01-28 MED ORDER — IPRATROPIUM-ALBUTEROL 0.5-2.5 (3) MG/3ML IN SOLN: 3.0000 mL | RESPIRATORY_TRACT | Status: DC | PRN

## 2021-01-28 MED ORDER — CRANBERRY EXTRACT 250 MG PO TABS: 500.0000 mg | ORAL_TABLET | Freq: Every morning | ORAL | Status: DC

## 2021-01-28 MED ORDER — PREDNISONE 20 MG PO TABS: 40.0000 mg | ORAL_TABLET | Freq: Every day | ORAL | Status: DC

## 2021-01-28 MED ORDER — INSULIN GLARGINE 100 UNIT/ML SOLOSTAR PEN: 45.0000 [IU] | PEN_INJECTOR | Freq: Every day | SUBCUTANEOUS | Status: DC

## 2021-01-28 MED ORDER — ACETAMINOPHEN 650 MG RE SUPP: 650.0000 mg | Freq: Four times a day (QID) | RECTAL | Status: DC | PRN

## 2021-01-28 MED ORDER — RISAQUAD PO CAPS
1.0000 | ORAL_CAPSULE | Freq: Every morning | ORAL | Status: DC
  Administered 2021-01-29 – 2021-02-05 (×7): 1 via ORAL
  Filled 2021-01-28 (×6): qty 1

## 2021-01-28 MED ORDER — AMLODIPINE BESYLATE 5 MG PO TABS
5.0000 mg | ORAL_TABLET | Freq: Every day | ORAL | Status: DC
  Administered 2021-01-28 – 2021-02-05 (×9): 5 mg via ORAL
  Filled 2021-01-28 (×9): qty 1

## 2021-01-28 MED ORDER — GUAIFENESIN ER 600 MG PO TB12
600.0000 mg | ORAL_TABLET | Freq: Two times a day (BID) | ORAL | Status: DC
  Administered 2021-01-28 – 2021-02-05 (×17): 600 mg via ORAL
  Filled 2021-01-28 (×17): qty 1

## 2021-01-28 MED ORDER — FUROSEMIDE 10 MG/ML IJ SOLN
40.0000 mg | Freq: Two times a day (BID) | INTRAMUSCULAR | Status: DC
  Administered 2021-01-28 – 2021-01-30 (×5): 40 mg via INTRAVENOUS
  Filled 2021-01-28 (×6): qty 4

## 2021-01-28 MED ORDER — INSULIN GLARGINE 100 UNIT/ML ~~LOC~~ SOLN
45.0000 [IU] | Freq: Every day | SUBCUTANEOUS | Status: DC
  Administered 2021-01-28 – 2021-01-29 (×2): 45 [IU] via SUBCUTANEOUS
  Filled 2021-01-28 (×3): qty 0.45

## 2021-01-28 MED ORDER — POTASSIUM CHLORIDE 20 MEQ PO PACK: 40.0000 meq | PACK | Freq: Once | ORAL | Status: DC

## 2021-01-28 MED ORDER — PANTOPRAZOLE SODIUM 40 MG PO TBEC
40.0000 mg | DELAYED_RELEASE_TABLET | Freq: Every day | ORAL | Status: DC
  Administered 2021-01-28 – 2021-02-05 (×9): 40 mg via ORAL
  Filled 2021-01-28 (×9): qty 1

## 2021-01-28 MED ORDER — VITAMIN D 25 MCG (1000 UNIT) PO TABS
1000.0000 [IU] | ORAL_TABLET | Freq: Every day | ORAL | Status: DC
  Administered 2021-01-28 – 2021-02-05 (×9): 1000 [IU] via ORAL
  Filled 2021-01-28 (×9): qty 1

## 2021-01-28 NOTE — ED Provider Notes (Signed)
[facility] Emergency Department Provider Note  ____________________________________________   Event Date/Time   First MD Initiated Contact with Patient 01/28/21 [phone]     (approximate)  I have reviewed the triage vital signs and the nursing notes.   HISTORY  Chief Complaint Shortness of Breath (Pt coming from home with complaints of shortness of breath. Pt's o2 sats 75-85% on 6L [place] per EMS. Pt was put on CPAP and given 2 duo nebs, 125mg  solumedrol. )  Level 5 caveat:  history/ROS limited by acute/critical illness  HPI [name] is a 75 y.o. male whose medical history includes A. fib, COPD, and CHF.  He presents by EMS for acute onset severe shortness of breath.  The duration of symptoms is unclear but he became very short of breath tonight and his wife called EMS.  When the paramedics arrived, the first responders had him on 6 L of oxygen by nasal cannula and his room air oxygen saturation was 75%.  He was very tight and moving minimal air.  They placed him on CPAP, gave Solu-Medrol 125 mg IV and 2 DuoNeb treatments.  Upon arrival in the ED he was feeling better and satting 99% on 100% O2 via the CPAP.  He denies chest pain.  Says he feels a little bit better.  Is not able to give additional history at this time.         Past Medical History:  Diagnosis Date  . Arrhythmia    atrial fibrillation  . Atrial fibrillation ([place])   . Basal cell carcinoma 05/2019   right nasal ala, Tx: EDC  . Basal cell carcinoma 04/12/2019   Left posterior ear. Nodular pattern, excoriated.   . CHF (congestive heart failure) ([place])   . COPD (chronic obstructive pulmonary disease) ([place])   . Coronary artery disease   . Diabetes mellitus without complication ([place])   . GERD (gastroesophageal reflux disease)   . HLD (hyperlipidemia)   . Hypertension   . Peripheral vascular disease ([place])   . Pneumonia   . Prostate cancer ([place])   . Squamous cell carcinoma of skin 08/10/2018    Left upper arm above elbow. [facility] SCC.  Marland Kitchen Squamous cell carcinoma of skin 09/07/2018   Right forearm, below elbow. WD SCC. [facility] 01/10/2019.  Marland Kitchen Stroke [facility])     Patient Active Problem List   Diagnosis Date Noted  . Acute respiratory failure ([place]) 01/28/2021  . Acute CHF (congestive heart failure) ([place]) 11/15/2020  . Acute on chronic combined systolic and diastolic CHF (congestive heart failure) ([place]) 10/24/2020  . Elevated troponin 10/24/2020  . CKD (chronic kidney disease), stage IV ([place]) 10/24/2020  . Malnutrition of moderate degree 12/27/2019  . Hypertensive emergency 12/26/2019  . Hyperglycemia due to type 2 diabetes mellitus ([place]) 12/26/2019  . Chronic kidney disease (CKD) stage G3b/A3, moderately decreased glomerular filtration rate (GFR) between 30-44 mL/min/1.73 square meter and albuminuria creatinine ratio greater than 300 mg/g (HCC) 12/26/2019  . History of COVID-19 12/26/2019  . History of CVA (cerebrovascular accident) 12/26/2019  . 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 History of second degree heart block 12/26/2019  . Elevated LFTs 12/26/2019  . Acute systolic CHF (congestive heart failure) ([place]) 10/11/2019  . Malignant neoplasm of prostate ([place]) 11/24/2016  . Bright red rectal bleeding 10/02/2016  . COPD with acute exacerbation ([place]) 01/17/2016  . Chronic systolic heart failure ([place]) 12/12/2015  . Essential hypertension 12/12/2015  . COPD (chronic obstructive pulmonary disease) with chronic bronchitis ([place]) 12/12/2015  .  Diabetes ([place]) 12/12/2015  . Tobacco abuse 12/12/2015  . Acute respiratory failure with hypoxia ([place]) 12/03/2015    Past Surgical History:  Procedure Laterality Date  . COLONOSCOPY    . CORONARY ARTERY BYPASS GRAFT  2006  . EYE SURGERY    . GOLD SEED IMPLANT N/A 12/30/2016   Procedure: GOLD SEED IMPLANT  x3;  Surgeon: Hollice Espy, MD;  Location: [facility] ORS;  Service: Urology;  Laterality: N/A;  . HEMORRHOID SURGERY N/A 10/02/2016   Procedure: PROCTOSCOPY AND  CONTROL OF RECTAL BLEEDING;  Surgeon: Excell Seltzer, MD;  Location: [facility] ORS;  Service: General;  Laterality: N/A;  . PROSTATE BIOPSY    . TONSILLECTOMY      Prior to Admission medications   Medication Sig Start Date End Date Taking? Authorizing Provider  acetaminophen (TYLENOL) 325 MG tablet Take 2 tablets (650 mg total) by mouth every 6 (six) hours as needed for mild pain (or Fever >/= 101). 01/20/16   Gouru, Illene Silver, MD  albuterol (PROVENTIL HFA;VENTOLIN HFA) 108 (90 Base) MCG/ACT inhaler Inhale 2 puffs into the lungs every 6 (six) hours as needed for wheezing or shortness of breath.    [provider]  amLODipine (NORVASC) 5 MG tablet Take 1 tablet (5 mg total) by mouth daily. 12/30/19   Ezekiel Slocumb, DO  apixaban (ELIQUIS) 5 MG TABS tablet Take 1 tablet (5 mg total) by mouth 2 (two) times daily. 03/12/20   Alisa Graff, FNP  bifidobacterium infantis (ALIGN) capsule Take 1 capsule by mouth every morning.    [provider]  cholecalciferol (VITAMIN D) 1000 units tablet Take 1,000 Units by mouth daily.    [provider]  Cinnamon 500 MG capsule Take 1,000 mg by mouth 2 (two) times daily.    [provider]  COMBIVENT RESPIMAT 20-100 MCG/ACT AERS respimat Inhale 2 puffs into the lungs 4 (four) times daily as needed. 09/18/20   [provider]  CRANBERRY EXTRACT PO Take 500 mg by mouth every morning.     [provider]  Ensure Max Protein (ENSURE MAX PROTEIN) LIQD Take 330 mLs (11 oz total) by mouth 2 (two) times daily between meals. 12/29/19   Ezekiel Slocumb, DO  gabapentin (NEURONTIN) 100 MG capsule Take 100 mg by mouth 2 (two) times daily. Patient not taking: Reported on 01/07/2021    [provider]  glucose 4 GM chewable tablet Chew 1 tablet by mouth as needed for low blood sugar.    [provider]  hydrALAZINE (APRESOLINE) 50 MG tablet Take 1 tablet (50 mg total) by mouth every 8 (eight) hours. 10/15/19    Ghimire, Henreitta Leber, MD  insulin aspart (NOVOLOG) 100 UNIT/ML injection 0-9 Units, Subcutaneous, 3 times daily with meals CBG < 70: Implement Hypoglycemia protocol/measures CBG 70 - 120: 0 units CBG 121 - 150: 1 unit CBG 151 - 200: 2 units CBG 201 - 250: 3 units CBG 251 - 300: 5 units CBG 301 - 350: 7 units CBG 351 - 400: 9 units CBG > 400: call MD Patient taking differently: Inject 5 Units into the skin 3 (three) times daily with meals. 10/15/19   Ghimire, Henreitta Leber, MD  isosorbide mononitrate (IMDUR) 30 MG 24 hr tablet Take 1 tablet (30 mg total) by mouth daily. 10/15/19   Ghimire, Henreitta Leber, MD  lactase (LACTAID) 3000 units tablet Take 3,000 Units by mouth 3 (three) times daily between meals as needed.    [provider]  LANTUS SOLOSTAR 100  UNIT/ML Solostar Pen Inject 45 Units into the skin daily at 10 pm. 10/15/19   Ghimire, Henreitta Leber, MD  Magnesium 250 MG TABS Take 250 mg by mouth daily.    [provider]  Misc Natural Products (TOTAL MEMORY & FOCUS FORMULA PO) Take 1 capsule by mouth See admin instructions. Take 2 capsule at 9:00 AM, 1 capsule at 5:00 PM, and 1 capsule at bedtime. Patient not taking: Reported on 01/07/2021    [provider]  Multiple Vitamin (MULTIVITAMIN WITH MINERALS) TABS tablet Take 1 tablet by mouth daily.    [provider]  NON FORMULARY Take 1 Dose by mouth 3 (three) times daily. Relief Factor OTC    [provider]  omeprazole (PRILOSEC) 20 MG capsule Take 20 mg by mouth daily.    [provider]  potassium chloride (K-DUR) 10 MEQ tablet Take 10 mEq by mouth daily.    [provider]  simvastatin (ZOCOR) 20 MG tablet Take 20 mg by mouth at bedtime.    [provider]  SUPER B COMPLEX/C PO Take 1 tablet by mouth every morning.    [provider]  torsemide (DEMADEX) 20 MG tablet Take 2 tablets (40 mg total) by mouth daily. 10/28/20 11/27/20  Sidney Ace, MD  traZODone  (DESYREL) 50 MG tablet Take 50 mg by mouth at bedtime.    [provider]  vitamin C (ASCORBIC ACID) 500 MG tablet Take 500 mg by mouth 2 (two) times daily.     [provider]    Allergies Lactose intolerance (gi)  Family History  Problem Relation Age of Onset  . Lung cancer Mother   . Heart attack Father   . Hypertension Other   . Cancer Maternal Grandfather        prostate  . Cancer Paternal Grandmother        breast    Social History Social History   Tobacco Use  . Smoking status: Current Every Day Smoker    Packs/day: 1.00    Years: 54.00    Pack years: 54.00    Types: Cigarettes  . Smokeless tobacco: Never Used  Vaping Use  . Vaping Use: Never used  Substance Use Topics  . Alcohol use: No    Alcohol/week: 0.0 standard drinks  . Drug use: No    Review of Systems Level 5 caveat:  history/ROS limited by acute/critical illness   ____________________________________________   PHYSICAL EXAM:  ED Triage Vitals  Enc Vitals Group     BP 01/28/21 0336 (!) 148/72     Pulse Rate 01/28/21 0336 86     Resp 01/28/21 0336 (!) 35     Temp 01/28/21 0336 (!) 97.3 F (36.3 C)     Temp Source 01/28/21 0336 Axillary     SpO2 01/28/21 0334 99 %     Weight --      Height --      Head Circumference --      Peak Flow --      Pain Score --      Pain Loc --      Pain Edu? --      Excl. in [place]? --      Constitutional: Alert and oriented.  Eyes: Conjunctivae are normal.  Head: Atraumatic. Nose: No congestion/rhinnorhea. Mouth/Throat: Patient is wearing a mask. Neck: No stridor.  No meningeal signs.   Cardiovascular: Tachycardia, regular rhythm. Good peripheral circulation. Respiratory: Increased respiratory effort and rate with intercostal retractions and  accessory muscle usage.  Tight breath sounds throughout with decreased air movement compared to "normal". Gastrointestinal: Soft and nontender. No distention.  Musculoskeletal: No lower extremity  tenderness nor edema. No gross deformities of extremities. Neurologic:  Normal speech and language. No gross focal neurologic deficits are appreciated.  Skin:  Skin is warm, dry and intact. Psychiatric: Mood and affect are normal. Speech and behavior are normal.  ____________________________________________   LABS (all labs ordered are listed, but only abnormal results are displayed)  Labs Reviewed  BRAIN NATRIURETIC PEPTIDE - Abnormal; Notable for the following components:      Result Value   B Natriuretic Peptide 1,082.4 (*)    All other components within normal limits  COMPREHENSIVE METABOLIC PANEL - Abnormal; Notable for the following components:   Glucose, Bld 334 (*)    BUN 28 (*)    Creatinine, Ser 2.11 (*)    GFR, Estimated 32 (*)    All other components within normal limits  CBC WITH DIFFERENTIAL/PLATELET - Abnormal; Notable for the following components:   WBC 15.4 (*)    Hemoglobin 9.9 (*)    HCT 32.9 (*)    MCV 75.8 (*)    MCH 22.8 (*)    RDW 18.1 (*)    Neutro Abs 12.4 (*)    Monocytes Absolute 1.1 (*)    All other components within normal limits  BLOOD GAS, VENOUS - Abnormal; Notable for the following components:   Bicarbonate 29.2 (*)    All other components within normal limits  TROPONIN I (HIGH SENSITIVITY) - Abnormal; Notable for the following components:   Troponin I (High Sensitivity) 33 (*)    All other components within normal limits  RESP PANEL BY RT-PCR (FLU A&B, COVID) ARPGX2  MAGNESIUM  TROPONIN I (HIGH SENSITIVITY)   ____________________________________________  EKG  ED ECG REPORT I, Hinda Kehr, the attending physician, personally viewed and interpreted this ECG.  Date: 01/28/2021 EKG Time: 3:48 AM Rate: 90 Rhythm: Atrial fibrillation with frequent PVC QRS Axis: normal Intervals: normal ST/T Wave abnormalities: Non-specific ST segment / T-wave changes, but no clear evidence of acute ischemia. Narrative Interpretation: no definitive  evidence of acute ischemia; does not meet STEMI criteria.   ____________________________________________  RADIOLOGY I, Hinda Kehr, personally viewed and evaluated these images (plain radiographs) as part of my medical decision making, as well as reviewing the written report by the radiologist.  ED MD interpretation: No acute abnormality.  Stable left-sided volume loss with pleural thickening.  Official radiology report(s): DG Chest Portable 1 View  Result Date: 01/28/2021 CLINICAL DATA:  Dyspnea EXAM: PORTABLE CHEST 1 VIEW COMPARISON:  11/15/2020 FINDINGS: The lungs are well expanded. Mild left-sided volume loss with associated left basilar pleural thickening is unchanged. Linear atelectasis within the left mid lung zone. Diffuse interstitial thickening again noted, unchanged. No superimposed focal pulmonary infiltrate. No pneumothorax or pleural effusion. Coronary artery bypass grafting has been performed. Cardiac size within normal limits. IMPRESSION: No active disease. Stable mild left-sided volume loss with left pleural thickening. Electronically Signed   By: Fidela Salisbury MD   On: 01/28/2021 04:11    ____________________________________________   PROCEDURES   Procedure(s) performed (including Critical Care):  .Critical Care Performed by: Hinda Kehr, MD Authorized by: Hinda Kehr, MD   Critical care provider statement:    Critical care time (minutes):  30   Critical care time was exclusive of:  Separately billable procedures and treating other patients   Critical care was necessary to treat or prevent imminent  or life-threatening deterioration of the following conditions:  Respiratory failure   Critical care was time spent personally by me on the following activities:  Development of treatment plan with patient or surrogate, discussions with consultants, evaluation of patient's response to treatment, examination of patient, obtaining history from patient or surrogate,  ordering and performing treatments and interventions, ordering and review of laboratory studies, ordering and review of radiographic studies, pulse oximetry, re-evaluation of patient's condition and review of old charts  .1-3 Lead EKG Interpretation Performed by: Hinda Kehr, MD Authorized by: Hinda Kehr, MD     Interpretation: abnormal     ECG rate:  92   ECG rate assessment: normal     Rhythm: atrial fibrillation     Ectopy: PVCs     Conduction: normal       ____________________________________________   INITIAL IMPRESSION / MDM / ASSESSMENT AND PLAN / ED COURSE  As part of my medical decision making, I reviewed the following data within the [place] notes reviewed and incorporated, Labs reviewed , EKG interpreted , Old chart reviewed, Radiograph reviewed , Discussed with admitting physician (Dr. Sidney Ace) and reviewed Notes from prior ED visits   Differential diagnosis includes, but is not limited to, COPD exacerbation, CHF exacerbation with pulmonary edema, ACS, PE, acute infection such as COVID or pneumonia.  The patient is on the cardiac monitor to evaluate for evidence of arrhythmia and/or significant heart rate changes.  Symptoms are most consistent with COPD exacerbation.  Patient has improved after duo nebs and CPAP.  I will continue the patient on BiPAP and given another DuoNeb.  He already received Solu-Medrol prior to arrival.  Physical exam does not suggest volume overload and pulmonary edema at this time.  Chest x-ray and lab work are pending.  Patient is able to speak in short sentences at this time and said that he feels better than he did before which is encouraging.  Doubt ACS or PE.     Clinical Course as of 01/28/21 0512  Mon Jan 28, 2021  6295 Labs notable for a leukocytosis of 15.4 of unclear clinical significance.  BUN and creatinine are slightly elevated over baseline.  He is hyperglycemic but with a normal anion gap.  VBG is  generally reassuring with no significant hypercapnia.  BNP is substantially elevated at 1082 but this is relatively consistent with prior values.  Similarly his high-sensitivity troponin is elevated at 33 but he is always slightly elevated.  I personally reviewed the patient's imaging and agree with the radiologist's interpretation that there are no acute abnormalities including no evidence of pulmonary edema.  I think that this is most likely a bad COPD exacerbation.  I have no indication that I should cover with empiric antibiotics in spite of the mild leukocytosis.  He is feeling better on the BiPAP but I will continue him on the BiPAP for now.  Once the respiratory viral panel results are back I will admit him to the hospitalist service. [CF]  0430 SARS Coronavirus 2 by RT PCR: NEGATIVE Negative COVID.  Consulting hospitalist for admission. [CF]  [phone] Discussed case with Dr. Sidney Ace who will admit [CF]    Clinical Course User Index [CF] Hinda Kehr, MD     ____________________________________________  FINAL CLINICAL IMPRESSION(S) / ED DIAGNOSES  Final diagnoses:  Acute respiratory failure with hypoxemia (HCC)  COPD exacerbation ([place])  Chronic heart failure, unspecified heart failure type ([place])     MEDICATIONS GIVEN DURING THIS VISIT:  Medications  ipratropium-albuterol (DUONEB) 0.5-2.5 (3) MG/3ML nebulizer solution 3 mL (3 mLs Nebulization Given 01/28/21 0341)     ED Discharge Orders    None      *Please note:  Thedore Pickel was evaluated in Emergency Department on 01/28/2021 for the symptoms described in the history of present illness. He was evaluated in the context of the global COVID-19 pandemic, which necessitated consideration that the patient might be at risk for infection with the SARS-CoV-2 virus that causes COVID-19. Institutional protocols and algorithms that pertain to the evaluation of patients at risk for COVID-19 are in a state of rapid change based on information  released by regulatory bodies including the CDC and federal and state organizations. These policies and algorithms were followed during the patient's care in the ED.  Some ED evaluations and interventions may be delayed as a result of limited staffing during and after the pandemic.*  Note:  This document was prepared using Dragon voice recognition software and may include unintentional dictation errors.   Hinda Kehr, MD 01/28/21 [phone]

## 2021-01-28 NOTE — ED Notes (Signed)
Report given to Ashley, RN

## 2021-01-28 NOTE — ED Notes (Signed)
Visited patient for routine inpatient admitting rounds by leadership. Patient currently in room with wife. Patient indicates he is feeling better. Not aware of pending admission. Aware of pending tests. No complaints or concerns noted.

## 2021-01-28 NOTE — Progress Notes (Deleted)
PHARMACIST - PHYSICIAN COMMUNICATION  CONCERNING:  Enoxaparin (Lovenox) for DVT Prophylaxis    RECOMMENDATION: Patient was prescribed enoxaprin 40mg  q24 hours for VTE prophylaxis.   There were no vitals filed for this visit.  There is no height or weight on file to calculate BMI.  Estimated Creatinine Clearance: 27.3 mL/min (A) (by C-G formula based on SCr of 2.11 mg/dL (H)).  Patient is candidate for enoxaparin 30mg  every 24 hours based on CrCl <76ml/min or Weight <45kg  DESCRIPTION: Pharmacy has adjusted enoxaparin dose per [facility] policy.  Patient is now receiving enoxaparin 30 mg every 24 hours   Renda Rolls, PharmD, [facility] 01/28/2021 5:35 AM

## 2021-01-28 NOTE — Progress Notes (Signed)
PHARMACIST - PHYSICIAN ORDER COMMUNICATION  CONCERNING: P&T Medication Policy on Herbal Medications  DESCRIPTION:  This patient's orders for:  Cinnamon and Cranberry Extract  have been noted.  This product(s) is classified as an "herbal" or natural product. Due to a lack of definitive safety studies or FDA approval, nonstandard manufacturing practices, plus the potential risk of unknown drug-drug interactions while on inpatient medications, the Pharmacy and Therapeutics Committee does not permit the use of "herbal" or natural products of this type within [facility].   ACTION TAKEN: The pharmacy department is unable to verify these orders at this time. Please reevaluate patient's clinical condition at discharge and address if the herbal or natural product(s) should be resumed at that time.  Renda Rolls, PharmD, [facility] 01/28/2021 5:27 AM

## 2021-01-28 NOTE — Telephone Encounter (Signed)
Wife called to let us know that patient is currently in hospital due to severe shortness of breath. Took his appointments out this week to let him recover. He is in the ER at this time - will wait to see if admitted.

## 2021-01-28 NOTE — Progress Notes (Signed)
PROGRESS NOTE    [name]  NOM:[id number] DOB: 1945-11-04 DOA: 01/28/2021 PCP: [name], MD   Chief complaint.  Shortness of breath. Brief Narrative:  [name] is a 75 y.o. Caucasian male with medical history significant for multiple medical problems including hypertension, CHF, type 2 diabetes mellitus, dyslipidemia and COPD, who presented to the emergency room with worsening dyspnea with associated cough productive of whitish sputum as well as wheezing over the last couple of days.  Patient also has a severe orthopnea, paroxysmal active dyspnea.  But he lost weight.  He also has leg edema.  Upon arriving the emergency room, he was placed on IV steroids and IV Lasix.   Assessment & Plan:   Active Problems:   Acute respiratory failure ([place])  #1. Acute respiratory failure with hypoxemia. COPD exacerbation. Acute on chronic diastolic congestive heart failure. Mild elevation probably secondary to demand ischemia.  Due to congestive heart failure. Patient initially required BiPAP, currently on 3 L oxygen.  He had a significant bronchospasm at the time of admission, he is placed on IV steroids.  I will continue Solu-Medrol at 40 mg twice a day.  He is also placed on Rocephin and Zithromax.  I will continue for short course. Also reviewed patient echocardiogram performed in January this year showed ejection fraction 50 to 55%.  Patient has evidence of volume overload with acute on chronic diastolic congestive heart failure.  Continue IV Lasix, I will continue that. Patient condition still critical, will follow in the progressive unit closely.  #2.  Acute kidney injury on chronic kidney disease stage IIIb. This is due to acute congestive heart failure.  Continue IV Lasix.  Monitor renal function closely.  3.  Uncontrolled type 2 diabetes with hyperglycemia. Continue scheduled Lantus and sliding scale insulin.  4.  Anemia. Check iron B12 level.  #5.  Chronic atrial  fibrillation. Continue Eliquis.  6.  Pure essential hypertension. Continue amlodipine and hydralazine.  DVT prophylaxis: Eliquis Code Status: Full Family Communication:  Disposition Plan:  .   Status is: Inpatient  Remains inpatient appropriate because:Inpatient level of care appropriate due to severity of illness   Dispo: The patient is from: Home              Anticipated d/c is to: Home              Patient currently is not medically stable to d/c.   Difficult to place patient No        No intake/output data recorded. No intake/output data recorded.     Consultants:   None  Procedures: None  Antimicrobials:  Rocephin and Zithromax.  Subjective: Patient still complaining of significant short of breath at rest.  Cough, with small amount of yellow mucus. No fever or chills per No abdominal pain or nausea vomiting No dysuria hematuria.  Objective: Vitals:   01/28/21 0930 01/28/21 1100 01/28/21 1130 01/28/21 1215  BP: (!) 166/74 (!) 131/59 (!) 162/44 (!) 155/56  Pulse: 68 64 67 66  Resp: 20 20 18 20   Temp:    98.1 F (36.7 C)  TempSrc:    Oral  SpO2: 97% 99% 100% 100%   No intake or output data in the 24 hours ending 01/28/21 1301 There were no vitals filed for this visit.  Examination:  General exam: Appears calm and comfortable  Respiratory system: Crackles in the base. Respiratory effort normal. Cardiovascular system: Irregular. No JVD, murmurs, rubs, gallops or clicks.  Gastrointestinal system: Abdomen is nondistended,  soft and nontender. No organomegaly or masses felt. Normal bowel sounds heard. Central nervous system: Alert and oriented. No focal neurological deficits. Extremities: 1+edema Skin: No rashes, lesions or ulcers Psychiatry: Judgement and insight appear normal. Mood & affect appropriate.     Data Reviewed: I have personally reviewed following labs and imaging studies  CBC: Recent Labs  Lab 01/28/21 0338  WBC 15.4*   NEUTROABS 12.4*  HGB 9.9*  HCT 32.9*  MCV 75.8*  PLT 161   Basic Metabolic Panel: Recent Labs  Lab 01/28/21 0338  NA 135  K 3.8  CL 98  CO2 25  GLUCOSE 334*  BUN 28*  CREATININE 2.11*  CALCIUM 8.9  MG 2.4   GFR: Estimated Creatinine Clearance: 27.3 mL/min (A) (by C-G formula based on SCr of 2.11 mg/dL (H)). Liver Function Tests: Recent Labs  Lab 01/28/21 0338  AST 22  ALT 16  ALKPHOS 117  BILITOT 1.0  PROT 7.3  ALBUMIN 3.6   No results for input(s): LIPASE, AMYLASE in the last 168 hours. No results for input(s): AMMONIA in the last 168 hours. Coagulation Profile: No results for input(s): INR, PROTIME in the last 168 hours. Cardiac Enzymes: No results for input(s): CKTOTAL, CKMB, CKMBINDEX, TROPONINI in the last 168 hours. BNP (last 3 results) No results for input(s): PROBNP in the last 8760 hours. HbA1C: No results for input(s): HGBA1C in the last 72 hours. CBG: Recent Labs  Lab 01/22/21 1055 01/22/21 1108 01/22/21 1202 01/24/21 1016 01/24/21 1207  GLUCAP 94 128* 121* 103* 141*   Lipid Profile: No results for input(s): CHOL, HDL, LDLCALC, TRIG, CHOLHDL, LDLDIRECT in the last 72 hours. Thyroid Function Tests: No results for input(s): TSH, T4TOTAL, FREET4, T3FREE, THYROIDAB in the last 72 hours. Anemia Panel: No results for input(s): VITAMINB12, FOLATE, FERRITIN, TIBC, IRON, RETICCTPCT in the last 72 hours. Sepsis Labs: No results for input(s): PROCALCITON, LATICACIDVEN in the last 168 hours.  Recent Results (from the past 240 hour(s))  Resp Panel by RT-PCR (Flu A&B, Covid) Nasopharyngeal Swab     Status: None   Collection Time: 01/28/21  3:38 AM   Specimen: Nasopharyngeal Swab; Nasopharyngeal(NP) swabs in vial transport medium  Result Value Ref Range Status   SARS Coronavirus 2 by RT PCR NEGATIVE NEGATIVE Final    Comment: (NOTE) SARS-CoV-2 target nucleic acids are NOT DETECTED.  The SARS-CoV-2 RNA is generally detectable in upper  respiratory specimens during the acute phase of infection. The lowest concentration of SARS-CoV-2 viral copies this assay can detect is 138 copies/mL. A negative result does not preclude SARS-Cov-2 infection and should not be used as the sole basis for treatment or other patient management decisions. A negative result may occur with  improper specimen collection/handling, submission of specimen other than nasopharyngeal swab, presence of viral mutation(s) within the areas targeted by this assay, and inadequate number of viral copies(<138 copies/mL). A negative result must be combined with clinical observations, patient history, and epidemiological information. The expected result is Negative.  Fact Sheet for Patients:  EntrepreneurPulse.com.au  Fact Sheet for Healthcare Providers:  IncredibleEmployment.be  This test is no t yet approved or cleared by the [place] FDA and  has been authorized for detection and/or diagnosis of SARS-CoV-2 by FDA under an Emergency Use Authorization (EUA). This EUA will remain  in effect (meaning this test can be used) for the duration of the COVID-19 declaration under Section 564(b)(1) of the Act, 21 U.S.C.section 360bbb-3(b)(1), unless the authorization is terminated  or revoked sooner.  Influenza A by PCR NEGATIVE NEGATIVE Final   Influenza B by PCR NEGATIVE NEGATIVE Final    Comment: (NOTE) The Xpert Xpress SARS-CoV-2/FLU/RSV plus assay is intended as an aid in the diagnosis of influenza from Nasopharyngeal swab specimens and should not be used as a sole basis for treatment. Nasal washings and aspirates are unacceptable for Xpert Xpress SARS-CoV-2/FLU/RSV testing.  Fact Sheet for Patients: EntrepreneurPulse.com.au  Fact Sheet for Healthcare Providers: IncredibleEmployment.be  This test is not yet approved or cleared by the [place] FDA and has been  authorized for detection and/or diagnosis of SARS-CoV-2 by FDA under an Emergency Use Authorization (EUA). This EUA will remain in effect (meaning this test can be used) for the duration of the COVID-19 declaration under Section 564(b)(1) of the Act, 21 U.S.C. section 360bbb-3(b)(1), unless the authorization is terminated or revoked.  Performed at [facility], [address] [place], [place] [address]          Radiology Studies: DG Chest Portable 1 View  Result Date: 01/28/2021 CLINICAL DATA:  Dyspnea EXAM: PORTABLE CHEST 1 VIEW COMPARISON:  11/15/2020 FINDINGS: The lungs are well expanded. Mild left-sided volume loss with associated left basilar pleural thickening is unchanged. Linear atelectasis within the left mid lung zone. Diffuse interstitial thickening again noted, unchanged. No superimposed focal pulmonary infiltrate. No pneumothorax or pleural effusion. Coronary artery bypass grafting has been performed. Cardiac size within normal limits. IMPRESSION: No active disease. Stable mild left-sided volume loss with left pleural thickening. Electronically Signed   By: Fidela Salisbury MD   On: 01/28/2021 04:11        Scheduled Meds: . acidophilus  1 capsule Oral q morning  . amLODipine  5 mg Oral Daily  . apixaban  5 mg Oral BID  . vitamin C  500 mg Oral BID  . cholecalciferol  1,000 Units Oral Daily  . furosemide  40 mg Intravenous Q12H  . guaiFENesin  600 mg Oral BID  . hydrALAZINE  50 mg Oral Q8H  . insulin aspart  0-15 Units Subcutaneous TID WC  . insulin aspart  0-5 Units Subcutaneous QHS  . insulin glargine  45 Units Subcutaneous QHS  . ipratropium-albuterol  3 mL Nebulization QID  . isosorbide mononitrate  30 mg Oral Daily  . lactase  3,000 Units Oral TID WC  . magnesium oxide  200 mg Oral Daily  . methylPREDNISolone (SOLU-MEDROL) injection  40 mg Intravenous Q6H   Followed by  . [START ON 01/29/2021] predniSONE  40 mg Oral Q breakfast  . multivitamin  with minerals  1 tablet Oral Daily  . pantoprazole  40 mg Oral Daily  . potassium chloride  10 mEq Oral Daily  . potassium chloride  40 mEq Oral Once  . Ensure Max Protein  11 oz Oral BID BM  . simvastatin  20 mg Oral QHS  . traZODone  50 mg Oral QHS   Continuous Infusions: . cefTRIAXone (ROCEPHIN)  IV Stopped (01/28/21 0645)     LOS: 0 days    Time spent:     Sharen Hones, MD [facility]   To contact the attending provider between 7A-7P or the covering provider during after hours 7P-7A, please log into the web site [email or web address] and access using universal [place] password for that web site. If you do not have the password, please call the hospital operator.  01/28/2021, 1:01 PM

## 2021-01-28 NOTE — H&P (Signed)
[place]   PATIENT NAME: [name]    MR#:  [id number]  DATE OF BIRTH:  1946/03/28  DATE OF ADMISSION:  01/28/2021  PRIMARY CARE PHYSICIAN: [name], MD   Patient is coming from: Home  REQUESTING/REFERRING PHYSICIAN: Hinda Kehr, MD  CHIEF COMPLAINT:   Chief Complaint  Patient presents with  . Shortness of Breath    Pt coming from home with complaints of shortness of breath. Pt's o2 sats 75-85% on 6L [place] per EMS. Pt was put on CPAP and given 2 duo nebs, 125mg  solumedrol.     HISTORY OF PRESENT ILLNESS:  [name] is a 75 y.o. Caucasian male with medical history significant for multiple medical problems that are mentioned below including hypertension, CHF, type 2 diabetes mellitus, dyslipidemia and COPD, who presented to the emergency room with a [place] of worsening dyspnea with associated cough productive of whitish sputum as well as wheezing over the last couple of days.  He denies any fever or chills.  No chest pain or palpitations.  No nausea or vomiting or abdominal pain.  No dysuria, oliguria or hematuria or flank pain. ED Course: Upon presentation to the emergency room blood pressure was 148/72 with a temperature of 97.3, respiratory to 35 and pulse oximetry was 9900% on CPAP.  VBG showed pH 7.31 with bicarbonate of 29.2 and CMP was remarkable for hyperglycemia of 334 and a BUN of 28 with creatinine of 2.11 slightly above previous levels.  BNP was 1082.4 and high-sensitivity troponin I 33 and later 42.  CBC showed leukocytosis of 15.4 with neutrophilia as well as anemia.  Influenza antigens and COVID-19 PCR came back negative.   EKG as reviewed by me : Showed atrial fibrillation with controlled ventricular sponsor of 79 with PVCs and nonspecific intraventricular conduction delay as well as lateral T wave inversion Imaging:Portable chest ray showed; No active disease.  There was stable mild left-sided volume loss with left pleural thickening.  The patient was  given a total of 3 duo nebs, 1 year and 2 by EMS as well as 125 mg IV Solu-Medrol by EMS.  He is currently on BiPAP.  He will be admitted to a progressive unit bed for further evaluation and management. PAST MEDICAL HISTORY:   Past Medical History:  Diagnosis Date  . Arrhythmia    atrial fibrillation  . Atrial fibrillation ([place])   . Basal cell carcinoma 05/2019   right nasal ala, Tx: EDC  . Basal cell carcinoma 04/12/2019   Left posterior ear. Nodular pattern, excoriated.   . CHF (congestive heart failure) ([place])   . COPD (chronic obstructive pulmonary disease) ([place])   . Coronary artery disease   . Diabetes mellitus without complication ([place])   . GERD (gastroesophageal reflux disease)   . HLD (hyperlipidemia)   . Hypertension   . Peripheral vascular disease ([place])   . Pneumonia   . Prostate cancer ([place])   . Squamous cell carcinoma of skin 08/10/2018   Left upper arm above elbow. [facility].  Marland Kitchen Squamous cell carcinoma of skin 09/07/2018   Right forearm, below elbow. [facility]. [facility] 01/10/2019.  Marland Kitchen Stroke [facility])     PAST SURGICAL HISTORY:   Past Surgical History:  Procedure Laterality Date  . COLONOSCOPY    . CORONARY ARTERY BYPASS GRAFT  2006  . EYE SURGERY    . GOLD SEED IMPLANT N/A 12/30/2016   Procedure: GOLD SEED IMPLANT  x3;  Surgeon: Hollice Espy, MD;  Location: [facility]  ORS;  Service: Urology;  Laterality: N/A;  . HEMORRHOID SURGERY N/A 10/02/2016   Procedure: PROCTOSCOPY AND CONTROL OF RECTAL BLEEDING;  Surgeon: Excell Seltzer, MD;  Location: [facility] ORS;  Service: General;  Laterality: N/A;  . PROSTATE BIOPSY    . TONSILLECTOMY      SOCIAL HISTORY:   Social History   Tobacco Use  . Smoking status: Current Every Day Smoker    Packs/day: 1.00    Years: 54.00    Pack years: 54.00    Types: Cigarettes  . Smokeless tobacco: Never Used  Substance Use Topics  . Alcohol use: No    Alcohol/week: 0.0 standard drinks    FAMILY HISTORY:   Family History  Problem Relation Age  of Onset  . Lung cancer Mother   . Heart attack Father   . Hypertension Other   . Cancer Maternal Grandfather        prostate  . Cancer Paternal Grandmother        breast    DRUG ALLERGIES:   Allergies  Allergen Reactions  . Lactose Intolerance (Gi)     REVIEW OF SYSTEMS:   ROS As per history of present illness. All pertinent systems were reviewed above. Constitutional, HEENT, cardiovascular, respiratory, GI, GU, musculoskeletal, neuro, psychiatric, endocrine, integumentary and hematologic systems were reviewed and are otherwise negative/unremarkable except for positive findings mentioned above in the HPI.   MEDICATIONS AT HOME:   Prior to Admission medications   Medication Sig Start Date End Date Taking? Authorizing Provider  acetaminophen (TYLENOL) 325 MG tablet Take 2 tablets (650 mg total) by mouth every 6 (six) hours as needed for mild pain (or Fever >/= 101). 01/20/16   Gouru, Illene Silver, MD  albuterol (PROVENTIL HFA;VENTOLIN HFA) 108 (90 Base) MCG/ACT inhaler Inhale 2 puffs into the lungs every 6 (six) hours as needed for wheezing or shortness of breath.    [provider]  amLODipine (NORVASC) 5 MG tablet Take 1 tablet (5 mg total) by mouth daily. 12/30/19   Ezekiel Slocumb, DO  apixaban (ELIQUIS) 5 MG TABS tablet Take 1 tablet (5 mg total) by mouth 2 (two) times daily. 03/12/20   Alisa Graff, FNP  bifidobacterium infantis (ALIGN) capsule Take 1 capsule by mouth every morning.    [provider]  cholecalciferol (VITAMIN D) 1000 units tablet Take 1,000 Units by mouth daily.    [provider]  Cinnamon 500 MG capsule Take 1,000 mg by mouth 2 (two) times daily.    [provider]  COMBIVENT RESPIMAT 20-100 MCG/ACT AERS respimat Inhale 2 puffs into the lungs 4 (four) times daily as needed. 09/18/20   [provider]  CRANBERRY EXTRACT PO Take 500 mg by mouth every morning.     [provider]  Ensure Max Protein (ENSURE MAX  PROTEIN) LIQD Take 330 mLs (11 oz total) by mouth 2 (two) times daily between meals. 12/29/19   Ezekiel Slocumb, DO  gabapentin (NEURONTIN) 100 MG capsule Take 100 mg by mouth 2 (two) times daily. Patient not taking: Reported on 01/07/2021    [provider]  glucose 4 GM chewable tablet Chew 1 tablet by mouth as needed for low blood sugar.    [provider]  hydrALAZINE (APRESOLINE) 50 MG tablet Take 1 tablet (50 mg total) by mouth every 8 (eight) hours. 10/15/19   Ghimire, Henreitta Leber, MD  insulin aspart (NOVOLOG) 100 UNIT/ML injection 0-9 Units, Subcutaneous, 3 times daily with meals CBG < 70: Implement Hypoglycemia  protocol/measures CBG 70 - 120: 0 units CBG 121 - 150: 1 unit CBG 151 - 200: 2 units CBG 201 - 250: 3 units CBG 251 - 300: 5 units CBG 301 - 350: 7 units CBG 351 - 400: 9 units CBG > 400: call MD Patient taking differently: Inject 5 Units into the skin 3 (three) times daily with meals. 10/15/19   Ghimire, Henreitta Leber, MD  isosorbide mononitrate (IMDUR) 30 MG 24 hr tablet Take 1 tablet (30 mg total) by mouth daily. 10/15/19   Ghimire, Henreitta Leber, MD  lactase (LACTAID) 3000 units tablet Take 3,000 Units by mouth 3 (three) times daily between meals as needed.    [provider]  LANTUS SOLOSTAR 100 UNIT/ML Solostar Pen Inject 45 Units into the skin daily at 10 pm. 10/15/19   Ghimire, Henreitta Leber, MD  Magnesium 250 MG TABS Take 250 mg by mouth daily.    [provider]  Misc Natural Products (TOTAL MEMORY & FOCUS FORMULA PO) Take 1 capsule by mouth See admin instructions. Take 2 capsule at 9:00 AM, 1 capsule at 5:00 PM, and 1 capsule at bedtime. Patient not taking: Reported on 01/07/2021    [provider]  Multiple Vitamin (MULTIVITAMIN WITH MINERALS) TABS tablet Take 1 tablet by mouth daily.    [provider]  NON FORMULARY Take 1 Dose by mouth 3 (three) times daily. Relief Factor OTC    [provider]  omeprazole (PRILOSEC)  20 MG capsule Take 20 mg by mouth daily.    [provider]  potassium chloride (K-DUR) 10 MEQ tablet Take 10 mEq by mouth daily.    [provider]  simvastatin (ZOCOR) 20 MG tablet Take 20 mg by mouth at bedtime.    [provider]  SUPER B COMPLEX/C PO Take 1 tablet by mouth every morning.    [provider]  torsemide (DEMADEX) 20 MG tablet Take 2 tablets (40 mg total) by mouth daily. 10/28/20 11/27/20  Sidney Ace, MD  traZODone (DESYREL) 50 MG tablet Take 50 mg by mouth at bedtime.    [provider]  vitamin C (ASCORBIC ACID) 500 MG tablet Take 500 mg by mouth 2 (two) times daily.     [provider]      VITAL SIGNS:  Blood pressure (!) 141/67, pulse 73, temperature (!) 97.3 F (36.3 C), temperature source Axillary, resp. rate 19, SpO2 99 %.  PHYSICAL EXAMINATION:  Physical Exam  GENERAL:  75 y.o.-year-old Caucasian male patient lying in the bed with mild to moderate respiratory distress on BiPAP EYES: Pupils equal, round, reactive to light and accommodation. No scleral icterus. Extraocular muscles intact.  HEENT: Head atraumatic, normocephalic. Oropharynx and nasopharynx clear.  NECK:  Supple, no jugular venous distention. No thyroid enlargement, no tenderness.  LUNGS: Diffuse expiratory wheezes with slightly tight expiratory airflow and harsh vesicular breathing. CARDIOVASCULAR: Regular rate and rhythm, S1, S2 normal. No murmurs, rubs, or gallops.  ABDOMEN: Soft, nondistended, nontender. Bowel sounds present. No organomegaly or mass.  EXTREMITIES: No pedal edema, cyanosis, or clubbing.  NEUROLOGIC: Cranial nerves II through XII are intact. Muscle strength 5/5 in all extremities. Sensation intact. Gait not checked.  PSYCHIATRIC: The patient is alert and oriented x 3.  Normal affect and good eye contact. SKIN: No obvious rash, lesion, or ulcer.   LABORATORY PANEL:   CBC Recent Labs  Lab 01/28/21 0338  WBC 15.4*   HGB 9.9*  HCT 32.9*  PLT 336   ------------------------------------------------------------------------------------------------------------------  Chemistries  Recent Labs  Lab 01/28/21 0338  NA 135  K 3.8  CL 98  CO2 25  GLUCOSE 334*  BUN 28*  CREATININE 2.11*  CALCIUM 8.9  MG 2.4  AST 22  ALT 16  ALKPHOS 117  BILITOT 1.0   ------------------------------------------------------------------------------------------------------------------  Cardiac Enzymes No results for input(s): TROPONINI in the last 168 hours. ------------------------------------------------------------------------------------------------------------------  RADIOLOGY:  DG Chest Portable 1 View  Result Date: 01/28/2021 CLINICAL DATA:  Dyspnea EXAM: PORTABLE CHEST 1 VIEW COMPARISON:  11/15/2020 FINDINGS: The lungs are well expanded. Mild left-sided volume loss with associated left basilar pleural thickening is unchanged. Linear atelectasis within the left mid lung zone. Diffuse interstitial thickening again noted, unchanged. No superimposed focal pulmonary infiltrate. No pneumothorax or pleural effusion. Coronary artery bypass grafting has been performed. Cardiac size within normal limits. IMPRESSION: No active disease. Stable mild left-sided volume loss with left pleural thickening. Electronically Signed   By: Fidela Salisbury MD   On: 01/28/2021 04:11      IMPRESSION AND PLAN:  Active Problems:   Acute respiratory failure ([place])  1.  Acute hypoxic respiratory failure requiring BiPAP, secondary to COPD acute exacerbation. -The patient will be admitted to a progressive unit bed. -We will continue BiPAP that can be later switched to nasal cannula when appropriate. -O2 protocol will be followed. -We will continue steroid therapy with IV Solu-Medrol. -We will continue bronchodilator therapy with DuoNebs 4 times daily and every 4 hours as needed. -We will place the patient on IV antibiotic therapy with  Rocephin given severity of COPD exacerbation and add p.o. Zithromax. -Sputum culture will be obtained. -Mucolytic therapy will be provided.  2.  Essential hypertension. -We will continue Norvasc and hydralazine  3.  Type 2 diabetes mellitus with peripheral neuropathy. -The patient will be placed on supplement coverage with NovoLog and we will will continue basal coverage. -Neurontin will be continued.  4.  Chronic atrial fibrillation with controlled ventricular response. -We will continue p.o. Eliquis.  5.  Chronic diastolic CHF with currently elevated BNP. -We will gently diurese with IV Lasix.   DVT prophylaxis: We will continue Eliquis. Code Status: full code. Family Communication:  The plan of care was discussed in details with the patient (with no family at bedside). I answered all questions. The patient agreed to proceed with the above mentioned plan. Further management will depend upon hospital course. Disposition Plan: Back to previous home environment Consults called: none. All the records are reviewed and case discussed with ED provider.  Status is: Inpatient  Remains inpatient appropriate because:Hemodynamically unstable, Ongoing diagnostic testing needed not appropriate for outpatient work up, Unsafe d/c plan, IV treatments appropriate due to intensity of illness or inability to take PO and Inpatient level of care appropriate due to severity of illness   Dispo: The patient is from: Home              Anticipated d/c is to: Home              Patient currently is not medically stable to d/c.   Difficult to place patient No  TOTAL TIME TAKING CARE OF THIS PATIENT: 55 minutes.    Christel Mormon M.D on 01/28/2021 at 5:19 AM  [facility]   From 7 PM-7 AM, contact night-coverage [email or web address]  CC: Primary care physician; [name], MD

## 2021-01-28 NOTE — Progress Notes (Signed)
Inpatient Diabetes Program Recommendations  AACE/ADA: New Consensus Statement on Inpatient Glycemic Control   Target Ranges:  Prepandial:   less than 140 mg/dL      Peak postprandial:   less than 180 mg/dL (1-2 hours)      Critically ill patients:  140 - 180 mg/dL   Results for [name], [name] (MRN [id number]) as of 01/28/2021 12:46  Ref. Range 01/28/2021 03:38  Glucose Latest Ref Range: 70 - 99 mg/dL 334 (H)   Review of Glycemic Control  Diabetes history: DM2 Outpatient Diabetes medications: Lantus 45 units QHS, Novolog 0-9 units TID with meals Current orders for Inpatient glycemic control: Lantus 45 unit QHS; Prednisone 40 mg QAM, Solumedrol 40 mg Q6H  Inpatient Diabetes Program Recommendations:    Insulin: Please consider ordering CBGs AC&HS and Novolog 0-9 units TID with meals and Novolog 0-5 units QHS.  Thanks, Barnie Alderman, RN, MSN, CDE Diabetes Coordinator Inpatient Diabetes Program [phone] (Team Pager from 8am to 5pm)

## 2021-01-28 NOTE — Progress Notes (Signed)
Pt taken off bipap, tolerating well at this time. RN aware. Pt on roomair, sats 98%.

## 2021-01-28 NOTE — ED Notes (Signed)
Breakfast meal tray given at this time.   This RN at bedside to help pt set up food. O2 saturation was 89% on RA. Placed pt on 4L [place] pt sat 95%

## 2021-01-28 NOTE — ED Notes (Signed)
RT at bedside. Took pt off the BiPap and placed on RA. Pt maintaining 94-95%

## 2021-01-28 NOTE — ED Notes (Signed)
RT at bedside to reassess patient.

## 2021-01-28 NOTE — Progress Notes (Signed)
MD Roosevelt Locks made aware of pt's CBG result of 541, order obtained to give 20 units of Novolog. Will continue to monitor.

## 2021-01-29 DIAGNOSIS — I5043 Acute on chronic combined systolic (congestive) and diastolic (congestive) heart failure: Secondary | ICD-10-CM

## 2021-01-29 DIAGNOSIS — N178 Other acute kidney failure: Secondary | ICD-10-CM

## 2021-01-29 DIAGNOSIS — I482 Chronic atrial fibrillation, unspecified: Secondary | ICD-10-CM | POA: Diagnosis not present

## 2021-01-29 DIAGNOSIS — N1832 Chronic kidney disease, stage 3b: Secondary | ICD-10-CM

## 2021-01-29 DIAGNOSIS — J9601 Acute respiratory failure with hypoxia: Secondary | ICD-10-CM | POA: Diagnosis not present

## 2021-01-29 LAB — CBC
HCT: 30.2 % — ABNORMAL LOW (ref 39.0–52.0)
Hemoglobin: 9 g/dL — ABNORMAL LOW (ref 13.0–17.0)
MCH: 22.7 pg — ABNORMAL LOW (ref 26.0–34.0)
MCHC: 29.8 g/dL — ABNORMAL LOW (ref 30.0–36.0)
MCV: 76.3 fL — ABNORMAL LOW (ref 80.0–100.0)
Platelets: 290 10*3/uL (ref 150–400)
RBC: 3.96 MIL/uL — ABNORMAL LOW (ref 4.22–5.81)
RDW: 17.9 % — ABNORMAL HIGH (ref 11.5–15.5)
WBC: 12.7 10*3/uL — ABNORMAL HIGH (ref 4.0–10.5)
nRBC: 0 % (ref 0.0–0.2)

## 2021-01-29 LAB — GLUCOSE, CAPILLARY
Glucose-Capillary: 174 mg/dL — ABNORMAL HIGH (ref 70–99)
Glucose-Capillary: 170 mg/dL — ABNORMAL HIGH (ref 70–99)
Glucose-Capillary: 144 mg/dL — ABNORMAL HIGH (ref 70–99)
Glucose-Capillary: 197 mg/dL — ABNORMAL HIGH (ref 70–99)

## 2021-01-29 LAB — BASIC METABOLIC PANEL
Anion gap: 10 (ref 5–15)
BUN: 36 mg/dL — ABNORMAL HIGH (ref 8–23)
CO2: 28 mmol/L (ref 22–32)
Calcium: 8.9 mg/dL (ref 8.9–10.3)
Chloride: 98 mmol/L (ref 98–111)
Creatinine, Ser: 2.05 mg/dL — ABNORMAL HIGH (ref 0.61–1.24)
GFR, Estimated: 33 mL/min — ABNORMAL LOW (ref >60)
Glucose, Bld: 189 mg/dL — ABNORMAL HIGH (ref 70–99)
Potassium: 4.5 mmol/L (ref 3.5–5.1)
Sodium: 136 mmol/L (ref 135–145)

## 2021-01-29 LAB — LEGIONELLA PNEUMOPHILA SEROGP 1 UR AG: L. pneumophila Serogp 1 Ur Ag: NEGATIVE

## 2021-01-29 LAB — MAGNESIUM: Magnesium: 2.6 mg/dL — ABNORMAL HIGH (ref 1.7–2.4)

## 2021-01-29 MED ORDER — IPRATROPIUM-ALBUTEROL 0.5-2.5 (3) MG/3ML IN SOLN
3.0000 mL | Freq: Two times a day (BID) | RESPIRATORY_TRACT | Status: DC
  Administered 2021-01-29 – 2021-01-30 (×2): 3 mL via RESPIRATORY_TRACT
  Filled 2021-01-29 (×2): qty 3

## 2021-01-29 MED ORDER — IPRATROPIUM-ALBUTEROL 0.5-2.5 (3) MG/3ML IN SOLN: 3.0000 mL | RESPIRATORY_TRACT | Status: DC | PRN

## 2021-01-29 MED ORDER — SODIUM CHLORIDE 0.9 % IV SOLN
300.0000 mg | Freq: Once | INTRAVENOUS | Status: AC
  Administered 2021-01-29: 300 mg via INTRAVENOUS
  Filled 2021-01-29: qty 15

## 2021-01-29 NOTE — Plan of Care (Signed)
Pt is AAOx4. Pt denies any pain. VSS. Will continue to monitor patient.    Problem: Education: Goal: Knowledge of General Education information will improve Description: Including pain rating scale, medication(s)/side effects and non-pharmacologic comfort measures Outcome: Progressing   Problem: Health Behavior/Discharge Planning: Goal: Ability to manage health-related needs will improve Outcome: Progressing   Problem: Clinical Measurements: Goal: Ability to maintain clinical measurements within normal limits will improve Outcome: Progressing Goal: Will remain free from infection Outcome: Progressing Goal: Diagnostic test results will improve Outcome: Progressing Goal: Respiratory complications will improve Outcome: Progressing Goal: Cardiovascular complication will be avoided Outcome: Progressing   Problem: Activity: Goal: Risk for activity intolerance will decrease Outcome: Progressing   Problem: Nutrition: Goal: Adequate nutrition will be maintained Outcome: Progressing   Problem: Coping: Goal: Level of anxiety will decrease Outcome: Progressing   Problem: Elimination: Goal: Will not experience complications related to bowel motility Outcome: Progressing Goal: Will not experience complications related to urinary retention Outcome: Progressing   Problem: Pain Managment: Goal: General experience of comfort will improve Outcome: Progressing   Problem: Safety: Goal: Ability to remain free from injury will improve Outcome: Progressing   Problem: Skin Integrity: Goal: Risk for impaired skin integrity will decrease Outcome: Progressing

## 2021-01-29 NOTE — Consult Note (Addendum)
   Heart Failure Nurse Navigator Note  HFpEf 50-55%  He presented to the emergency room with complaints of shortness of breath.  He had also noted a productive cough of white sputum and wheezing over the last couple days.  Comorbidities:  Atrial fibrillation COPD Coronary artery disease Diabetes GERD Hyperlipidemia Hypertension   Medications:  Amlodipine 5 mg daily Apixaban 5 mg 2 times a day Furosemide 40 mg IV every 12 Hydralazine 50 mg every 8 hours Isosorbide mononitrate 30 mg daily Simvastatin 20 mg at bedtime  Labs:  Sodium 136, potassium 4.5, chloride 98, CO2 28, BUN 36 up from 28, creatinine 2.05 down from 2.11 Weight is 72.4 kg Intake not documented Output 1300 mL BMI 25.76 Blood pressure 141/56   Assessment:  General-he is awake and alert lying in bed in no acute distress.  HEENT-pupils are equal, normocephalic.  Cardiac-heart tones of regular rate and rhythm with systolic murmur.  Chest-fine crackles in the bases.  Has productive cough of clear sputum.  Abdomen-round soft nontender.  Lower extremities-trace edema, skin is wrinkled.  Psych-is pleasant and appropriate, makes good eye contact.  Neurologic-speech is clear moves all extremities without difficulty.   Met with patient today, he had fallen off the wagon as far as weighing himself daily and recording.  Along with not following the low-sodium diet.  He states that his scale was broken and every time he stepped on it that he would get it would give an  E  reading, told the patient that we will supply him with a scale so he has a working 1 when he goes home.  He also states that he had not been recording it, his book that he was recording his readings and became buried under some items on the loveseat.  Again stressed the importance of weighing daily and reporting 2 to 3 pound weight gain or 5 pounds within a week to his position.  Also discussed signs and symptoms to report to  physician.  He states that he has started cardiac rehab and is enjoying that.  He has appointment to see Darylene Price in the heart failure clinic on April 15 at [facility]

## 2021-01-29 NOTE — Progress Notes (Addendum)
PROGRESS NOTE    [name]  YQI:[id number] DOB: [birth year] DOA: 01/28/2021 PCP: [name], MD   Chief complaint.  Shortness of breath. Brief Narrative:  [name] a 75 y.o.Caucasian malewith medical history significant formultiple medical problems including hypertension, CHF, type 2 diabetes mellitus, dyslipidemia and COPD, who presented to the emergency room with worsening dyspnea with associated cough productive of whitish sputum as well as wheezing over the last couple of days.  Patient also has a severe orthopnea, paroxysmal nocturnal dyspnea.  But he lost weight.  He also has leg edema.  Upon arriving the emergency room, he was placed on IV steroids and IV Lasix.  Most recent echocardiogram on 11/2020 showed ejection fraction 50 to 55%.   Assessment & Plan:   Active Problems:   Acute respiratory failure with hypoxia (HCC)   COPD with acute exacerbation (HCC)   Hyperglycemia due to type 2 diabetes mellitus ([place])   Acute on chronic combined systolic and diastolic CHF (congestive heart failure) (HCC)   Acute renal failure superimposed on stage 3b chronic kidney disease (HCC)   Atrial fibrillation, chronic ([place])  #1. Acute respiratory failure with hypoxemia. COPD exacerbation. Acute on chronic diastolic congestive heart failure. Mild elevation probably secondary to demand ischemia.  Due to congestive heart failure. Patient was requiring BiPAP in the emergency room, currently still on 3 L oxygen today.  He does not use oxygen at home. I will continue IV Lasix for today, renal function still stable. Steroids will change to oral, due to severe hyperglycemia. Patient condition is gradually improving, will wean down oxygen, anticipating discharge in 1 to 2 days.  #2.  Acute kidney injury on chronic kidney disease stage IIIb. Patient had a worsening renal function at admission from baseline, but renal function remained stable after IV Lasix.  We will continue monitor closely  while on Lasix.  3.  Uncontrolled type 2 diabetes with hyperglycemia. Severe hyperglycemia was due to IV steroids, IV steroids were decreased to oral 20 mg daily.  Will follow glucose.  4.  Iron deficient anemia.  Borderline B12 level. Give IV iron, also give B12 shot.  Pending homocystine level.  5.  Chronic atrial fibrillation. Continue Eliquis.  6.  Essential hypertension.  Patient currently on amlodipine and hydralazine.    DVT prophylaxis: Eliquis Code Status: Full Family Communication:  Disposition Plan:  .   Status is: Inpatient  Remains inpatient appropriate because:Inpatient level of care appropriate due to severity of illness   Dispo: The patient is from: Home              Anticipated d/c is to: Home              Patient currently is not medically stable to d/c.   Difficult to place patient No        I/O last 3 completed shifts: In: -  Out: 1300 [Urine:1300] Total I/O In: 740 [P.O.:740] Out: -      Consultants:   None  Procedures: None  Antimicrobials: None  Subjective: Patient slept well, he slept until 4:37 AM, was woke up by nurse. He did not have any additional paroxysmal nocturnal dyspnea.  Short of breath seem to be improving. Cough, with white mucus. Denies any abdominal pain or nausea vomiting. No fever chills. No dysuria hematuria. No headache or dizziness. No chest pain or palpitation.  Objective: Vitals:   01/29/21 0419 01/29/21 0527 01/29/21 0738 01/29/21 0750  BP: (!) 138/59  (!) 158/44   Pulse: 68  69   Resp: 16  18   Temp: 98.8 F (37.1 C)  98.5 F (36.9 C)   TempSrc: Oral  Oral   SpO2: 97%  93% 98%  Weight:  72.4 kg      Intake/Output Summary (Last 24 hours) at 01/29/2021 1027 Last data filed at 01/29/2021 1000 Gross per 24 hour  Intake 740 ml  Output 1300 ml  Net -560 ml   Filed Weights   01/29/21 0527  Weight: 72.4 kg    Examination:  General exam: Appears calm and comfortable  Respiratory system:  Coarse breathing sounds. Respiratory effort normal. Cardiovascular system: Irregular. No JVD, murmurs, rubs, gallops or clicks.  Gastrointestinal system: Abdomen is nondistended, soft and nontender. No organomegaly or masses felt. Normal bowel sounds heard. Central nervous system: Alert and oriented. No focal neurological deficits. Extremities: 1+ leg edema Skin: No rashes, lesions or ulcers Psychiatry: Judgement and insight appear normal. Mood & affect appropriate.     Data Reviewed: I have personally reviewed following labs and imaging studies  CBC: Recent Labs  Lab 01/28/21 0338 01/29/21 0517  WBC 15.4* 12.7*  NEUTROABS 12.4*  --   HGB 9.9* 9.0*  HCT 32.9* 30.2*  MCV 75.8* 76.3*  PLT 336 619   Basic Metabolic Panel: Recent Labs  Lab 01/28/21 0338 01/29/21 0517  NA 135 136  K 3.8 4.5  CL 98 98  CO2 25 28  GLUCOSE 334* 189*  BUN 28* 36*  CREATININE 2.11* 2.05*  CALCIUM 8.9 8.9  MG 2.4 2.6*   GFR: Estimated Creatinine Clearance: 28.1 mL/min (A) (by C-G formula based on SCr of 2.05 mg/dL (H)). Liver Function Tests: Recent Labs  Lab 01/28/21 0338  AST 22  ALT 16  ALKPHOS 117  BILITOT 1.0  PROT 7.3  ALBUMIN 3.6   No results for input(s): LIPASE, AMYLASE in the last 168 hours. No results for input(s): AMMONIA in the last 168 hours. Coagulation Profile: No results for input(s): INR, PROTIME in the last 168 hours. Cardiac Enzymes: No results for input(s): CKTOTAL, CKMB, CKMBINDEX, TROPONINI in the last 168 hours. BNP (last 3 results) No results for input(s): PROBNP in the last 8760 hours. HbA1C: Recent Labs    01/28/21 0543  HGBA1C 6.8*   CBG: Recent Labs  Lab 01/28/21 1735 01/28/21 1822 01/28/21 2033 01/28/21 2122 01/29/21 0739  GLUCAP 516* 432* 297* 266* 174*   Lipid Profile: No results for input(s): CHOL, HDL, LDLCALC, TRIG, CHOLHDL, LDLDIRECT in the last 72 hours. Thyroid Function Tests: No results for input(s): TSH, T4TOTAL, FREET4,  T3FREE, THYROIDAB in the last 72 hours. Anemia Panel: Recent Labs    01/28/21 0543 01/28/21 1414  VITAMINB12  --  309  TIBC 456*  --   IRON 25*  --    Sepsis Labs: No results for input(s): PROCALCITON, LATICACIDVEN in the last 168 hours.  Recent Results (from the past 240 hour(s))  Resp Panel by RT-PCR (Flu A&B, Covid) Nasopharyngeal Swab     Status: None   Collection Time: 01/28/21  3:38 AM   Specimen: Nasopharyngeal Swab; Nasopharyngeal(NP) swabs in vial transport medium  Result Value Ref Range Status   SARS Coronavirus 2 by RT PCR NEGATIVE NEGATIVE Final    Comment: (NOTE) SARS-CoV-2 target nucleic acids are NOT DETECTED.  The SARS-CoV-2 RNA is generally detectable in upper respiratory specimens during the acute phase of infection. The lowest concentration of SARS-CoV-2 viral copies this assay can detect is 138 copies/mL. A negative result does not preclude SARS-Cov-2  infection and should not be used as the sole basis for treatment or other patient management decisions. A negative result may occur with  improper specimen collection/handling, submission of specimen other than nasopharyngeal swab, presence of viral mutation(s) within the areas targeted by this assay, and inadequate number of viral copies(<138 copies/mL). A negative result must be combined with clinical observations, patient history, and epidemiological information. The expected result is Negative.  Fact Sheet for Patients:  EntrepreneurPulse.com.au  Fact Sheet for Healthcare Providers:  IncredibleEmployment.be  This test is no t yet approved or cleared by the [place] FDA and  has been authorized for detection and/or diagnosis of SARS-CoV-2 by FDA under an Emergency Use Authorization (EUA). This EUA will remain  in effect (meaning this test can be used) for the duration of the COVID-19 declaration under Section 564(b)(1) of the Act, 21 U.S.C.section  360bbb-3(b)(1), unless the authorization is terminated  or revoked sooner.       Influenza A by PCR NEGATIVE NEGATIVE Final   Influenza B by PCR NEGATIVE NEGATIVE Final    Comment: (NOTE) The Xpert Xpress SARS-CoV-2/FLU/RSV plus assay is intended as an aid in the diagnosis of influenza from Nasopharyngeal swab specimens and should not be used as a sole basis for treatment. Nasal washings and aspirates are unacceptable for Xpert Xpress SARS-CoV-2/FLU/RSV testing.  Fact Sheet for Patients: EntrepreneurPulse.com.au  Fact Sheet for Healthcare Providers: IncredibleEmployment.be  This test is not yet approved or cleared by the [place] FDA and has been authorized for detection and/or diagnosis of SARS-CoV-2 by FDA under an Emergency Use Authorization (EUA). This EUA will remain in effect (meaning this test can be used) for the duration of the COVID-19 declaration under Section 564(b)(1) of the Act, 21 U.S.C. section 360bbb-3(b)(1), unless the authorization is terminated or revoked.  Performed at [facility], [address], [place], [place] [address]          Radiology Studies: DG Chest Portable 1 View  Result Date: 01/28/2021 CLINICAL DATA:  Dyspnea EXAM: PORTABLE CHEST 1 VIEW COMPARISON:  11/15/2020 FINDINGS: The lungs are well expanded. Mild left-sided volume loss with associated left basilar pleural thickening is unchanged. Linear atelectasis within the left mid lung zone. Diffuse interstitial thickening again noted, unchanged. No superimposed focal pulmonary infiltrate. No pneumothorax or pleural effusion. Coronary artery bypass grafting has been performed. Cardiac size within normal limits. IMPRESSION: No active disease. Stable mild left-sided volume loss with left pleural thickening. Electronically Signed   By: Fidela Salisbury MD   On: 01/28/2021 04:11        Scheduled Meds: . acidophilus  1 capsule Oral q morning  .  amLODipine  5 mg Oral Daily  . apixaban  5 mg Oral BID  . vitamin C  500 mg Oral BID  . cholecalciferol  1,000 Units Oral Daily  . furosemide  40 mg Intravenous Q12H  . guaiFENesin  600 mg Oral BID  . hydrALAZINE  50 mg Oral Q8H  . insulin aspart  0-15 Units Subcutaneous TID WC  . insulin aspart  0-5 Units Subcutaneous QHS  . insulin glargine  45 Units Subcutaneous QHS  . ipratropium-albuterol  3 mL Nebulization BID  . isosorbide mononitrate  30 mg Oral Daily  . lactase  3,000 Units Oral TID WC  . magnesium oxide  200 mg Oral Daily  . multivitamin with minerals  1 tablet Oral Daily  . pantoprazole  40 mg Oral Daily  . potassium chloride  10 mEq Oral Daily  .  potassium chloride  40 mEq Oral Once  . predniSONE  20 mg Oral Q breakfast  . Ensure Max Protein  11 oz Oral BID BM  . simvastatin  20 mg Oral QHS  . traZODone  50 mg Oral QHS   Continuous Infusions: . cefTRIAXone (ROCEPHIN)  IV 1 g (01/29/21 0653)  . iron sucrose       LOS: 1 day    Time spent: 34 minutes    Sharen Hones, MD [facility]   To contact the attending provider between 7A-7P or the covering provider during after hours 7P-7A, please log into the web site [email or web address] and access using universal [place] password for that web site. If you do not have the password, please call the hospital operator.  01/29/2021, 10:27 AM

## 2021-01-29 NOTE — Progress Notes (Signed)
Cross Cover Patient reports shortness of breath Oxygen sats stable. Recently received his lasix and brathing treatment that he felt helped some. Not toxic appearing and short shallow breathing pattern VSS   BBS some rhonchi crackles bilateral lower lobes Patient states prior hospitalizations was put on CPAP that helped his breathing and requested. He admits no prior work up of sleep apnea or use of CPAP at home.  He does not feel his primary doctor is helpful in referring for his needs.  Given his need for frequent hospitalizations and reported benefit of CPAP while in hospital, he would benefit greatly for referral to pulmonologist or sleep apnea clinic to get further evaluation for CPAP home use

## 2021-01-30 DIAGNOSIS — N178 Other acute kidney failure: Secondary | ICD-10-CM | POA: Diagnosis not present

## 2021-01-30 DIAGNOSIS — J441 Chronic obstructive pulmonary disease with (acute) exacerbation: Secondary | ICD-10-CM | POA: Diagnosis not present

## 2021-01-30 DIAGNOSIS — J9601 Acute respiratory failure with hypoxia: Secondary | ICD-10-CM | POA: Diagnosis not present

## 2021-01-30 DIAGNOSIS — N1832 Chronic kidney disease, stage 3b: Secondary | ICD-10-CM | POA: Diagnosis not present

## 2021-01-30 LAB — CBC WITH DIFFERENTIAL/PLATELET
Abs Immature Granulocytes: 0.15 10*3/uL — ABNORMAL HIGH (ref 0.00–0.07)
Basophils Absolute: 0 10*3/uL (ref 0.0–0.1)
Basophils Relative: 0 %
Eosinophils Absolute: 0 10*3/uL (ref 0.0–0.5)
Eosinophils Relative: 0 %
HCT: 32.9 % — ABNORMAL LOW (ref 39.0–52.0)
Hemoglobin: 9.7 g/dL — ABNORMAL LOW (ref 13.0–17.0)
Immature Granulocytes: 1 %
Lymphocytes Relative: 9 %
Lymphs Abs: 1.5 10*3/uL (ref 0.7–4.0)
MCH: 22.6 pg — ABNORMAL LOW (ref 26.0–34.0)
MCHC: 29.5 g/dL — ABNORMAL LOW (ref 30.0–36.0)
MCV: 76.5 fL — ABNORMAL LOW (ref 80.0–100.0)
Monocytes Absolute: 1.1 10*3/uL — ABNORMAL HIGH (ref 0.1–1.0)
Monocytes Relative: 7 %
Neutro Abs: 13.1 10*3/uL — ABNORMAL HIGH (ref 1.7–7.7)
Neutrophils Relative %: 83 %
Platelets: 317 10*3/uL (ref 150–400)
RBC: 4.3 MIL/uL (ref 4.22–5.81)
RDW: 18.1 % — ABNORMAL HIGH (ref 11.5–15.5)
WBC: 15.8 10*3/uL — ABNORMAL HIGH (ref 4.0–10.5)
nRBC: 0.1 % (ref 0.0–0.2)

## 2021-01-30 LAB — BASIC METABOLIC PANEL
Anion gap: 10 (ref 5–15)
BUN: 45 mg/dL — ABNORMAL HIGH (ref 8–23)
CO2: 28 mmol/L (ref 22–32)
Calcium: 8.8 mg/dL — ABNORMAL LOW (ref 8.9–10.3)
Chloride: 98 mmol/L (ref 98–111)
Creatinine, Ser: 2.14 mg/dL — ABNORMAL HIGH (ref 0.61–1.24)
GFR, Estimated: 31 mL/min — ABNORMAL LOW (ref >60)
Glucose, Bld: 32 mg/dL — CL (ref 70–99)
Potassium: 4.2 mmol/L (ref 3.5–5.1)
Sodium: 136 mmol/L (ref 135–145)

## 2021-01-30 LAB — GLUCOSE, CAPILLARY
Glucose-Capillary: 39 mg/dL — CL (ref 70–99)
Glucose-Capillary: 87 mg/dL (ref 70–99)
Glucose-Capillary: 119 mg/dL — ABNORMAL HIGH (ref 70–99)
Glucose-Capillary: 118 mg/dL — ABNORMAL HIGH (ref 70–99)
Glucose-Capillary: 262 mg/dL — ABNORMAL HIGH (ref 70–99)
Glucose-Capillary: 245 mg/dL — ABNORMAL HIGH (ref 70–99)

## 2021-01-30 LAB — HOMOCYSTEINE: Homocysteine: 20.1 umol/L — ABNORMAL HIGH (ref 0.0–19.2)

## 2021-01-30 LAB — MAGNESIUM: Magnesium: 2.6 mg/dL — ABNORMAL HIGH (ref 1.7–2.4)

## 2021-01-30 MED ORDER — INSULIN GLARGINE 100 UNIT/ML ~~LOC~~ SOLN
20.0000 [IU] | Freq: Every day | SUBCUTANEOUS | Status: DC
  Administered 2021-01-30: 20 [IU] via SUBCUTANEOUS
  Filled 2021-01-30 (×2): qty 0.2

## 2021-01-30 MED ORDER — IPRATROPIUM-ALBUTEROL 0.5-2.5 (3) MG/3ML IN SOLN
3.0000 mL | Freq: Four times a day (QID) | RESPIRATORY_TRACT | Status: DC
  Administered 2021-01-30 – 2021-01-31 (×3): 3 mL via RESPIRATORY_TRACT
  Filled 2021-01-30 (×3): qty 3

## 2021-01-30 NOTE — Progress Notes (Signed)
PROGRESS NOTE    [name]  UKG:[id number] DOB: [birth year] DOA: 01/28/2021 PCP: [name], MD   Chief complaint.  Shortness of breath. Brief Narrative:  [name] a 75 y.o.Caucasian malewith medical history significant formultiple medical problems including hypertension, CHF, type 2 diabetes mellitus, dyslipidemia and COPD, who presented to the emergency room with worsening dyspnea with associated cough productive of whitish sputum as well as wheezing over the last couple of days.  Patient also has a severe orthopnea, paroxysmal nocturnal dyspnea.  But he lost weight.  He also has leg edema.  Upon arriving the emergency room, he was placed on IV steroids and IV Lasix.  Most recent echocardiogram on 11/2020 showed ejection fraction 50 to 55%.   Assessment & Plan:   Active Problems:   Acute respiratory failure with hypoxia (HCC)   COPD with acute exacerbation (HCC)   Hyperglycemia due to type 2 diabetes mellitus (HCC)   Acute on chronic diastolic CHF (congestive heart failure) (HCC)   Acute renal failure superimposed on stage 3b chronic kidney disease (HCC)   Atrial fibrillation, chronic ([place])  #1. Acute respiratory failure with hypoxemia 2/2 COPD exacerbation. --Pt has known COPD and current smoker.  Respiratory failure more likely due to COPD, not CHF (considering recent Echo normal). --Patient was requiring BiPAP in the emergency room, weaned down to 1 L oxygen today.   --started on steroid Plan: --cont prednisone --Schedule DuoNeb QID --Continue supplemental O2 to keep sats between 88-92%, wean as tolerated  Current smoker --cessation advised  CHF exacerbation ruled out --Echo in Jan 2022 showed normal left systolic and diastolic function, and normal right systolic function. --CXR on presentation unremarkable --d/c further diuretics  Mild troponin elevation, not clinically significant --trop 33 and 42, lower than prior readings  #2.  Acute kidney injury on  chronic kidney disease stage IIIb. --Hold further diuretic  3.  Uncontrolled type 2 diabetes with hyperglycemia.  Severe hyperglycemia was due to IV steroids  --had an episode of hypoglycemia to 30's this morning --reduce home Lantus to 20u nightly --SSI TID  4.  Iron deficient anemia.   Borderline B12 level. --s/p IV iron   Pending homocystine level.  5.  Chronic atrial fibrillation. Continue Eliquis.  6.  Essential hypertension.   --cont amlodipine, hydralazine, Imdur  Hypokalemia --monitor and replete PRN   DVT prophylaxis: JS:[id number] Code Status: Full code  Family Communication:  Status is: inpatient Dispo:   The patient is from: home Anticipated d/c is to: home Anticipated d/c date is: 1-2 days Patient currently is not medically stable to d/c due to: new hypoxia, being treated for COPD exacerbation   I/O last 3 completed shifts: In: 2040 [P.O.:2040] Out: 4000 [Urine:4000] Total I/O In: 1120 [P.O.:1120] Out: 6160 [Urine:1225]     Consultants:   None  Procedures: None  Antimicrobials: None  Subjective: Reported dyspnea about the same.     Objective: Vitals:   01/30/21 0817 01/30/21 1148 01/30/21 1300 01/30/21 1601  BP: (!) 148/61 (!) 156/61 (!) 150/59 (!) 158/49  Pulse:  (!) 56  (!) 56  Resp:  18  18  Temp:  98.4 F (36.9 C)  98.6 F (37 C)  TempSrc:  Oral  Oral  SpO2:  99% 91% 98%  Weight:        Intake/Output Summary (Last 24 hours) at 01/30/2021 1705 Last data filed at 01/30/2021 1354 Gross per 24 hour  Intake 1360 ml  Output 3025 ml  Net -1665 ml   [employer]  01/29/21 0527 01/30/21 0407  Weight: 72.4 kg 72.5 kg    Examination:  Constitutional: NAD, AAOx3 HEENT: conjunctivae and lids normal, EOMI CV: No cyanosis.   RESP: normal respiratory effort, crackles at bases, reduced lung sounds, on 1L sating 92% Extremities: chronic appearing edema in BLE SKIN: warm, dry, scabs over BLE Neuro: II - XII grossly intact.   Psych:  Normal mood and affect.  Appropriate judgement and reason   Data Reviewed: I have personally reviewed following labs and imaging studies  CBC: Recent Labs  Lab 01/28/21 0338 01/29/21 0517 01/30/21 0409  WBC 15.4* 12.7* 15.8*  NEUTROABS 12.4*  --  13.1*  HGB 9.9* 9.0* 9.7*  HCT 32.9* 30.2* 32.9*  MCV 75.8* 76.3* 76.5*  PLT 336 290 631   Basic Metabolic Panel: Recent Labs  Lab 01/28/21 0338 01/29/21 0517 01/30/21 0409  NA 135 136 136  K 3.8 4.5 4.2  CL 98 98 98  CO2 25 28 28   GLUCOSE 334* 189* 32*  BUN 28* 36* 45*  CREATININE 2.11* 2.05* 2.14*  CALCIUM 8.9 8.9 8.8*  MG 2.4 2.6* 2.6*   GFR: Estimated Creatinine Clearance: 26.9 mL/min (A) (by C-G formula based on SCr of 2.14 mg/dL (H)). Liver Function Tests: Recent Labs  Lab 01/28/21 0338  AST 22  ALT 16  ALKPHOS 117  BILITOT 1.0  PROT 7.3  ALBUMIN 3.6   No results for input(s): LIPASE, AMYLASE in the last 168 hours. No results for input(s): AMMONIA in the last 168 hours. Coagulation Profile: No results for input(s): INR, PROTIME in the last 168 hours. Cardiac Enzymes: No results for input(s): CKTOTAL, CKMB, CKMBINDEX, TROPONINI in the last 168 hours. BNP (last 3 results) No results for input(s): PROBNP in the last 8760 hours. HbA1C: Recent Labs    01/28/21 0543  HGBA1C 6.8*   CBG: Recent Labs  Lab 01/30/21 0555 01/30/21 0624 01/30/21 0812 01/30/21 1148 01/30/21 1604  GLUCAP 39* 87 119* 118* 262*   Lipid Profile: No results for input(s): CHOL, HDL, LDLCALC, TRIG, CHOLHDL, LDLDIRECT in the last 72 hours. Thyroid Function Tests: No results for input(s): TSH, T4TOTAL, FREET4, T3FREE, THYROIDAB in the last 72 hours. Anemia Panel: Recent Labs    01/28/21 0543 01/28/21 1414  VITAMINB12  --  309  TIBC 456*  --   IRON 25*  --    Sepsis Labs: No results for input(s): PROCALCITON, LATICACIDVEN in the last 168 hours.  Recent Results (from the past 240 hour(s))  Resp Panel by RT-PCR (Flu A&B,  Covid) Nasopharyngeal Swab     Status: None   Collection Time: 01/28/21  3:38 AM   Specimen: Nasopharyngeal Swab; Nasopharyngeal(NP) swabs in vial transport medium  Result Value Ref Range Status   SARS Coronavirus 2 by RT PCR NEGATIVE NEGATIVE Final    Comment: (NOTE) SARS-CoV-2 target nucleic acids are NOT DETECTED.  The SARS-CoV-2 RNA is generally detectable in upper respiratory specimens during the acute phase of infection. The lowest concentration of SARS-CoV-2 viral copies this assay can detect is 138 copies/mL. A negative result does not preclude SARS-Cov-2 infection and should not be used as the sole basis for treatment or other patient management decisions. A negative result may occur with  improper specimen collection/handling, submission of specimen other than nasopharyngeal swab, presence of viral mutation(s) within the areas targeted by this assay, and inadequate number of viral copies(<138 copies/mL). A negative result must be combined with clinical observations, patient history, and epidemiological information. The expected result is Negative.  Fact Sheet for Patients:  EntrepreneurPulse.com.au  Fact Sheet for Healthcare Providers:  IncredibleEmployment.be  This test is no t yet approved or cleared by the [place] FDA and  has been authorized for detection and/or diagnosis of SARS-CoV-2 by FDA under an Emergency Use Authorization (EUA). This EUA will remain  in effect (meaning this test can be used) for the duration of the COVID-19 declaration under Section 564(b)(1) of the Act, 21 U.S.C.section 360bbb-3(b)(1), unless the authorization is terminated  or revoked sooner.       Influenza A by PCR NEGATIVE NEGATIVE Final   Influenza B by PCR NEGATIVE NEGATIVE Final    Comment: (NOTE) The Xpert Xpress SARS-CoV-2/FLU/RSV plus assay is intended as an aid in the diagnosis of influenza from Nasopharyngeal swab specimens and should  not be used as a sole basis for treatment. Nasal washings and aspirates are unacceptable for Xpert Xpress SARS-CoV-2/FLU/RSV testing.  Fact Sheet for Patients: EntrepreneurPulse.com.au  Fact Sheet for Healthcare Providers: IncredibleEmployment.be  This test is not yet approved or cleared by the [place] FDA and has been authorized for detection and/or diagnosis of SARS-CoV-2 by FDA under an Emergency Use Authorization (EUA). This EUA will remain in effect (meaning this test can be used) for the duration of the COVID-19 declaration under Section 564(b)(1) of the Act, 21 U.S.C. section 360bbb-3(b)(1), unless the authorization is terminated or revoked.  Performed at [facility], [address] [place], [place] [address]          Radiology Studies: No results found.      Scheduled Meds: . acidophilus  1 capsule Oral q morning  . amLODipine  5 mg Oral Daily  . apixaban  5 mg Oral BID  . vitamin C  500 mg Oral BID  . cholecalciferol  1,000 Units Oral Daily  . guaiFENesin  600 mg Oral BID  . hydrALAZINE  50 mg Oral Q8H  . insulin aspart  0-15 Units Subcutaneous TID WC  . insulin glargine  20 Units Subcutaneous QHS  . ipratropium-albuterol  3 mL Nebulization QID  . isosorbide mononitrate  30 mg Oral Daily  . lactase  3,000 Units Oral TID WC  . magnesium oxide  200 mg Oral Daily  . multivitamin with minerals  1 tablet Oral Daily  . pantoprazole  40 mg Oral Daily  . potassium chloride  10 mEq Oral Daily  . potassium chloride  40 mEq Oral Once  . predniSONE  20 mg Oral Q breakfast  . Ensure Max Protein  11 oz Oral BID BM  . simvastatin  20 mg Oral QHS  . traZODone  50 mg Oral QHS   Continuous Infusions: . cefTRIAXone (ROCEPHIN)  IV 1 g (01/30/21 6440)     LOS: 2 days    Enzo Bi, MD [facility]   To contact the attending provider between 7A-7P or the covering provider during after hours 7P-7A, please  log into the web site [email or web address] and access using universal [place] password for that web site. If you do not have the password, please call the hospital operator.  01/30/2021, 5:05 PM

## 2021-01-30 NOTE — Plan of Care (Signed)

## 2021-01-30 NOTE — TOC Progression Note (Signed)
Transition of Care [facility]) - Progression Note    Patient Details  Name: Marguis Mathieson MRN: [id number] Date of Birth: 1946/04/09  [facility] of [facility]) CM/SW Contact  Eileen Stanford, LCSW Phone Number: 01/30/2021, 1:59 PM  Clinical Narrative:  Scale provided at bedside.Pt  Aware to take scale home at d/c.           Expected Discharge Plan and Services                                                 Social Determinants of Health (SDOH) Interventions    Readmission Risk Interventions Readmission Risk Prevention Plan 10/28/2020 12/27/2019  Transportation Screening Complete Complete  PCP or Specialist Appt within 3-5 Days - Complete  HRI or [place] - Complete  Medication Review ([place]) Complete Complete  PCP or Specialist appointment within 3-5 days of discharge Complete -  [place] or Home Care Consult Complete -  Palliative Care Screening Not Applicable -  [place] Not Applicable -  Some recent data might be hidden

## 2021-01-30 NOTE — Progress Notes (Signed)
Pt was c/o SOB with 3 L [place]. Pt was given sched lasix and neb tx. APP notified. Pt's VSS. Will follow APP's order for CPAP at night.

## 2021-01-30 NOTE — Progress Notes (Signed)
Mobility Specialist - Progress Note   01/30/21 1100  Mobility  Activity Ambulated in room  Level of Assistance Standby assist, set-up cues, supervision of patient - no hands on  Assistive Device Front wheel walker  Distance Ambulated (ft) 60 ft  Mobility Response Tolerated well  Mobility performed by Mobility specialist  $Mobility charge 1 Mobility    Pre-mobility: 60 HR, 94% SpO2 During mobility: 88 HR Post-mobility: 64 HR, 97% SpO2   Pt ambulated in room with RW and SBA. Uses SPC at baseline. No O2 use at baseline, currently utilizing 3L. No LOB noted. Pt denied SOB. O2 was difficult to determine during ambulation via pulse ox, but appeared to maintain mid-high 90s. Pt denied weakness/dizziness. Extra time needed for transfers, supervision only. No reports of pain.    Kathee Delton Mobility Specialist 01/30/21, 11:26 AM

## 2021-01-30 NOTE — Progress Notes (Signed)
BG is now 87.

## 2021-01-30 NOTE — Progress Notes (Signed)
Pt's BG this morning is 32 from lab draw. POCT recheck was 39. APP notified.  Pt is alert will give OJ and crackers and will recheck BG.

## 2021-01-31 ENCOUNTER — Encounter: Payer: Self-pay | Admitting: Family Medicine

## 2021-01-31 DIAGNOSIS — J441 Chronic obstructive pulmonary disease with (acute) exacerbation: Secondary | ICD-10-CM | POA: Diagnosis not present

## 2021-01-31 DIAGNOSIS — E1165 Type 2 diabetes mellitus with hyperglycemia: Secondary | ICD-10-CM | POA: Diagnosis not present

## 2021-01-31 DIAGNOSIS — Z794 Long term (current) use of insulin: Secondary | ICD-10-CM | POA: Diagnosis not present

## 2021-01-31 LAB — CBC
HCT: 33.5 % — ABNORMAL LOW (ref 39.0–52.0)
Hemoglobin: 10 g/dL — ABNORMAL LOW (ref 13.0–17.0)
MCH: 22.8 pg — ABNORMAL LOW (ref 26.0–34.0)
MCHC: 29.9 g/dL — ABNORMAL LOW (ref 30.0–36.0)
MCV: 76.3 fL — ABNORMAL LOW (ref 80.0–100.0)
Platelets: 301 10*3/uL (ref 150–400)
RBC: 4.39 MIL/uL (ref 4.22–5.81)
RDW: 18 % — ABNORMAL HIGH (ref 11.5–15.5)
WBC: 10.8 10*3/uL — ABNORMAL HIGH (ref 4.0–10.5)
nRBC: 0 % (ref 0.0–0.2)

## 2021-01-31 LAB — GLUCOSE, CAPILLARY
Glucose-Capillary: 114 mg/dL — ABNORMAL HIGH (ref 70–99)
Glucose-Capillary: 68 mg/dL — ABNORMAL LOW (ref 70–99)
Glucose-Capillary: 157 mg/dL — ABNORMAL HIGH (ref 70–99)
Glucose-Capillary: 180 mg/dL — ABNORMAL HIGH (ref 70–99)
Glucose-Capillary: 269 mg/dL — ABNORMAL HIGH (ref 70–99)

## 2021-01-31 LAB — BASIC METABOLIC PANEL
Anion gap: 9 (ref 5–15)
BUN: 42 mg/dL — ABNORMAL HIGH (ref 8–23)
CO2: 29 mmol/L (ref 22–32)
Calcium: 8.8 mg/dL — ABNORMAL LOW (ref 8.9–10.3)
Chloride: 98 mmol/L (ref 98–111)
Creatinine, Ser: 1.84 mg/dL — ABNORMAL HIGH (ref 0.61–1.24)
GFR, Estimated: 38 mL/min — ABNORMAL LOW (ref >60)
Glucose, Bld: 105 mg/dL — ABNORMAL HIGH (ref 70–99)
Potassium: 4.5 mmol/L (ref 3.5–5.1)
Sodium: 136 mmol/L (ref 135–145)

## 2021-01-31 LAB — MAGNESIUM: Magnesium: 2.9 mg/dL — ABNORMAL HIGH (ref 1.7–2.4)

## 2021-01-31 LAB — FOLATE: Folate: 36 ng/mL (ref >5.9)

## 2021-01-31 MED ORDER — PREDNISONE 20 MG PO TABS
40.0000 mg | ORAL_TABLET | Freq: Every day | ORAL | Status: DC
  Administered 2021-02-01 – 2021-02-05 (×5): 40 mg via ORAL
  Filled 2021-01-31 (×5): qty 2

## 2021-01-31 MED ORDER — PREDNISONE 20 MG PO TABS
20.0000 mg | ORAL_TABLET | Freq: Once | ORAL | Status: AC
  Administered 2021-01-31: 14:00:00 20 mg via ORAL
  Filled 2021-01-31: qty 1

## 2021-01-31 MED ORDER — IPRATROPIUM-ALBUTEROL 0.5-2.5 (3) MG/3ML IN SOLN
3.0000 mL | Freq: Three times a day (TID) | RESPIRATORY_TRACT | Status: DC
  Administered 2021-01-31 – 2021-02-01 (×3): 3 mL via RESPIRATORY_TRACT
  Filled 2021-01-31 (×3): qty 3

## 2021-01-31 MED ORDER — INSULIN GLARGINE 100 UNIT/ML ~~LOC~~ SOLN
10.0000 [IU] | Freq: Every day | SUBCUTANEOUS | Status: DC
  Administered 2021-01-31 – 2021-02-03 (×4): 10 [IU] via SUBCUTANEOUS
  Filled 2021-01-31 (×5): qty 0.1

## 2021-01-31 MED ORDER — POLYSACCHARIDE IRON COMPLEX 150 MG PO CAPS
150.0000 mg | ORAL_CAPSULE | Freq: Every day | ORAL | Status: DC
  Administered 2021-01-31 – 2021-02-05 (×6): 150 mg via ORAL
  Filled 2021-01-31 (×6): qty 1

## 2021-01-31 NOTE — Progress Notes (Signed)
PROGRESS NOTE    [name]  RDE:[id number] DOB: 08-Aug-1946 DOA: 01/28/2021 PCP: [name], MD   Chief complaint.  Shortness of breath. Brief Narrative:  [name] a 75 y.o.Caucasian malewith medical history significant formultiple medical problems including hypertension, CHF, type 2 diabetes mellitus, dyslipidemia and COPD, who presented to the emergency room with worsening dyspnea with associated cough productive of whitish sputum as well as wheezing over the last couple of days.  Patient also has a severe orthopnea, paroxysmal nocturnal dyspnea.  But he lost weight.  He also has leg edema.  Upon arriving the emergency room, he was placed on IV steroids and IV Lasix.  Most recent echocardiogram on 11/2020 showed ejection fraction 50 to 55%.   Assessment & Plan:   Active Problems:   Acute respiratory failure with hypoxia (HCC)   COPD with acute exacerbation (HCC)   Hyperglycemia due to type 2 diabetes mellitus (HCC)   Acute on chronic diastolic CHF (congestive heart failure) (HCC)   Acute renal failure superimposed on stage 3b chronic kidney disease (HCC)   Atrial fibrillation, chronic ([place])  #1. Acute respiratory failure with hypoxemia 2/2 COPD exacerbation. --Pt has known COPD and current smoker.  Respiratory failure more likely due to COPD, not CHF (considering recent Echo normal). --Patient was requiring BiPAP in the emergency room, weaned down to 1 L oxygen today.   --started on steroid Plan: --increase prednisone to 40 mg daily --cont DuoNeb scheduled --Continue supplemental O2 to keep sats between 88-92%, wean as tolerated  Current smoker --cessation advised  CHF exacerbation ruled out --CHF exacerbation was dx on admission, and IV lasix 40 mg BID started, however,  Echo in Jan 2022 showed normal left systolic and diastolic function, and normal right systolic function.  CXR on presentation unremarkable.  I do not think CHF exacerbation was the likely cause of  pt's dyspnea.   --further diuretics d/c'ed.  Mild troponin elevation, not clinically significant --trop 33 and 42, lower than prior readings  #2.  Acute kidney injury, POA on chronic kidney disease stage IIIb. --Cr 2.11 on presentation.  Baseline around 1.8 --hold further diuretics  3.  Uncontrolled type 2 diabetes with hyperglycemia.  Severe hyperglycemia was due to IV steroids  --had episodes of hypoglycemia to 30's morning of 3/30, and 60's this morning even after Lantus reduced. --further reduce home Lantus to 10u nightly --SSI TID --no bed-time coverage  4.  Iron deficient anemia.   Borderline B12 level. --s/p IV iron --check folate --start oral iron supplement  5.  Chronic atrial fibrillation. Continue Eliquis.  6.  Essential hypertension.   --cont amlodipine, hydralazine, Imdur  Hypokalemia --monitor and replete PRN   DVT prophylaxis: [id number] Code Status: Full code  Family Communication:  Status is: inpatient Dispo:   The patient is from: home Anticipated d/c is to: home Anticipated d/c date is: 1-2 days Patient currently is not medically stable to d/c due to: new hypoxia, being treated for COPD exacerbation   I/O last 3 completed shifts: In: 1460 [P.O.:1460] Out: 3250 [Urine:3250] Total I/O In: 680 [P.O.:680] Out: 200 [Urine:200]     Consultants:   None  Procedures: None  Antimicrobials: None  Subjective: Pt reported dyspnea maybe better.  DuoNeb maybe helped.  Weaned down to 2L O2.    Objective: Vitals:   01/31/21 1214 01/31/21 1322 01/31/21 1550 01/31/21 1612  BP: (!) 112/57 (!) 110/58 131/76 (!) 144/66  Pulse: 67 (!) 51 83 65  Resp: 18 17 18    Temp:  98.4 F (36.9 C) 98.5 F (36.9 C) 98 F (36.7 C) 98.1 F (36.7 C)  TempSrc: Oral Oral  Oral  SpO2: 100% 98% 95% 98%  Weight:      Height:        Intake/Output Summary (Last 24 hours) at 01/31/2021 1644 Last data filed at 01/31/2021 1000 Gross per 24 hour  Intake 1020 ml   Output 1625 ml  Net -605 ml   Filed Weights   01/30/21 0407 01/31/21 0526 01/31/21 1210  Weight: 72.5 kg 72.4 kg 72.4 kg    Examination:  Constitutional: NAD, AAOx3 HEENT: conjunctivae and lids normal, EOMI CV: No cyanosis.   RESP: normal respiratory effort, clear, on 2L Extremities: edema improved in BLE SKIN: warm, dry Neuro: II - XII grossly intact.   Psych: Normal mood and affect.  Appropriate judgement and reason   Data Reviewed: I have personally reviewed following labs and imaging studies  CBC: Recent Labs  Lab 01/28/21 0338 01/29/21 0517 01/30/21 0409 01/31/21 0438  WBC 15.4* 12.7* 15.8* 10.8*  NEUTROABS 12.4*  --  13.1*  --   HGB 9.9* 9.0* 9.7* 10.0*  HCT 32.9* 30.2* 32.9* 33.5*  MCV 75.8* 76.3* 76.5* 76.3*  PLT 336 290 317 782   Basic Metabolic Panel: Recent Labs  Lab 01/28/21 0338 01/29/21 0517 01/30/21 0409 01/31/21 0438  NA 135 136 136 136  K 3.8 4.5 4.2 4.5  CL 98 98 98 98  CO2 25 28 28 29   GLUCOSE 334* 189* 32* 105*  BUN 28* 36* 45* 42*  CREATININE 2.11* 2.05* 2.14* 1.84*  CALCIUM 8.9 8.9 8.8* 8.8*  MG 2.4 2.6* 2.6* 2.9*   GFR: Estimated Creatinine Clearance: 31.3 mL/min (A) (by C-G formula based on SCr of 1.84 mg/dL (H)). Liver Function Tests: Recent Labs  Lab 01/28/21 0338  AST 22  ALT 16  ALKPHOS 117  BILITOT 1.0  PROT 7.3  ALBUMIN 3.6   No results for input(s): LIPASE, AMYLASE in the last 168 hours. No results for input(s): AMMONIA in the last 168 hours. Coagulation Profile: No results for input(s): INR, PROTIME in the last 168 hours. Cardiac Enzymes: No results for input(s): CKTOTAL, CKMB, CKMBINDEX, TROPONINI in the last 168 hours. BNP (last 3 results) No results for input(s): PROBNP in the last 8760 hours. HbA1C: No results for input(s): HGBA1C in the last 72 hours. CBG: Recent Labs  Lab 01/30/21 2029 01/31/21 0741 01/31/21 0825 01/31/21 1217 01/31/21 1610  GLUCAP 245* 68* 114* 157* 180*   Lipid Profile: No  results for input(s): CHOL, HDL, LDLCALC, TRIG, CHOLHDL, LDLDIRECT in the last 72 hours. Thyroid Function Tests: No results for input(s): TSH, T4TOTAL, FREET4, T3FREE, THYROIDAB in the last 72 hours. Anemia Panel: No results for input(s): VITAMINB12, FOLATE, FERRITIN, TIBC, IRON, RETICCTPCT in the last 72 hours. Sepsis Labs: No results for input(s): PROCALCITON, LATICACIDVEN in the last 168 hours.  Recent Results (from the past 240 hour(s))  Resp Panel by RT-PCR (Flu A&B, Covid) Nasopharyngeal Swab     Status: None   Collection Time: 01/28/21  3:38 AM   Specimen: Nasopharyngeal Swab; Nasopharyngeal(NP) swabs in vial transport medium  Result Value Ref Range Status   SARS Coronavirus 2 by RT PCR NEGATIVE NEGATIVE Final    Comment: (NOTE) SARS-CoV-2 target nucleic acids are NOT DETECTED.  The SARS-CoV-2 RNA is generally detectable in upper respiratory specimens during the acute phase of infection. The lowest concentration of SARS-CoV-2 viral copies this assay can detect is 138 copies/mL. A negative  result does not preclude SARS-Cov-2 infection and should not be used as the sole basis for treatment or other patient management decisions. A negative result may occur with  improper specimen collection/handling, submission of specimen other than nasopharyngeal swab, presence of viral mutation(s) within the areas targeted by this assay, and inadequate number of viral copies(<138 copies/mL). A negative result must be combined with clinical observations, patient history, and epidemiological information. The expected result is Negative.  Fact Sheet for Patients:  EntrepreneurPulse.com.au  Fact Sheet for Healthcare Providers:  IncredibleEmployment.be  This test is no t yet approved or cleared by the [place] FDA and  has been authorized for detection and/or diagnosis of SARS-CoV-2 by FDA under an Emergency Use Authorization (EUA). This EUA will remain   in effect (meaning this test can be used) for the duration of the COVID-19 declaration under Section 564(b)(1) of the Act, 21 U.S.C.section 360bbb-3(b)(1), unless the authorization is terminated  or revoked sooner.       Influenza A by PCR NEGATIVE NEGATIVE Final   Influenza B by PCR NEGATIVE NEGATIVE Final    Comment: (NOTE) The Xpert Xpress SARS-CoV-2/FLU/RSV plus assay is intended as an aid in the diagnosis of influenza from Nasopharyngeal swab specimens and should not be used as a sole basis for treatment. Nasal washings and aspirates are unacceptable for Xpert Xpress SARS-CoV-2/FLU/RSV testing.  Fact Sheet for Patients: EntrepreneurPulse.com.au  Fact Sheet for Healthcare Providers: IncredibleEmployment.be  This test is not yet approved or cleared by the [place] FDA and has been authorized for detection and/or diagnosis of SARS-CoV-2 by FDA under an Emergency Use Authorization (EUA). This EUA will remain in effect (meaning this test can be used) for the duration of the COVID-19 declaration under Section 564(b)(1) of the Act, 21 U.S.C. section 360bbb-3(b)(1), unless the authorization is terminated or revoked.  Performed at [facility], [address] [place], [place] [address]          Radiology Studies: No results found.      Scheduled Meds: . acidophilus  1 capsule Oral q morning  . amLODipine  5 mg Oral Daily  . apixaban  5 mg Oral BID  . vitamin C  500 mg Oral BID  . cholecalciferol  1,000 Units Oral Daily  . guaiFENesin  600 mg Oral BID  . hydrALAZINE  50 mg Oral Q8H  . insulin aspart  0-15 Units Subcutaneous TID WC  . insulin glargine  10 Units Subcutaneous QHS  . ipratropium-albuterol  3 mL Nebulization TID  . isosorbide mononitrate  30 mg Oral Daily  . lactase  3,000 Units Oral TID WC  . multivitamin with minerals  1 tablet Oral Daily  . pantoprazole  40 mg Oral Daily  . potassium chloride   10 mEq Oral Daily  . potassium chloride  40 mEq Oral Once  . [START ON 02/01/2021] predniSONE  40 mg Oral Q breakfast  . Ensure Max Protein  11 oz Oral BID BM  . simvastatin  20 mg Oral QHS  . traZODone  50 mg Oral QHS   Continuous Infusions:    LOS: 3 days    Enzo Bi, MD [facility]   To contact the attending provider between 7A-7P or the covering provider during after hours 7P-7A, please log into the web site [email or web address] and access using universal [place] password for that web site. If you do not have the password, please call the hospital operator.  01/31/2021, 4:44 PM

## 2021-01-31 NOTE — Progress Notes (Addendum)
Mobility Specialist - Progress Note   01/31/21 1100  Mobility  Activity Ambulated in hall  Level of Assistance Standby assist, set-up cues, supervision of patient - no hands on  Assistive Device Front wheel walker  Distance Ambulated (ft) 180 ft  Mobility Response Tolerated well  Mobility performed by Mobility specialist  $Mobility charge 1 Mobility    Pre-mobility: 62 HR, 98% SpO2 Ambulation (3L): 74 HR, 99% SpO2 Ambulation (2L): 79 HR, 96% SpO2 Ambulation (RA): 80 HR, 90% SpO2 Post-Mobility: 64 HR, 96% SpO2   Pt ambulated to bathroom for BM and then in hallway with RW. No LOB. Very slow, but steady gait. Trial on various liters of O2 during ambulation, noted above. During last 52' of ambulation (RA), pt's O2 desat to 90% then dropped to 87% once returned to seated position. Voiced SOB as mobility reapplied 3L O2 use with sats rebounding to 96% prior to exit. RPE 5/10. Nurse notified.   Kathee Delton Mobility Specialist 01/31/21, 11:42 AM

## 2021-01-31 NOTE — Care Management Important Message (Signed)
Important Message  Patient Details  Name: [name] MRN: [id number] Date of Birth: Feb 22, 1946   Medicare Important Message Given:  Yes     Dannette Barbara 01/31/2021, 1:40 PM

## 2021-01-31 NOTE — Progress Notes (Signed)
Patient ambulated short distance from bed to chair, quickly dyspneic with exertion O2 sats dropping to 75% on room air with transfer/stand to chair. O2 reapplied at 3 L, patient to 88% with standing, and sitting to chair, soon recovered to 94% after rest.

## 2021-01-31 NOTE — Progress Notes (Signed)
Hypoglycemic Event  CBG: 68  Treatment: 4 oz juice/soda and breakfast tray given/eaten  Symptoms: Hungry  Follow-up CBG: Time:0820 CBG Result:114  Possible Reasons for Event: Inadequate meal intake  Comments/MD notified:patient asymptomatic     [name] D Allien Melberg

## 2021-02-01 ENCOUNTER — Inpatient Hospital Stay: Payer: PPO

## 2021-02-01 DIAGNOSIS — N178 Other acute kidney failure: Secondary | ICD-10-CM | POA: Diagnosis not present

## 2021-02-01 DIAGNOSIS — J9601 Acute respiratory failure with hypoxia: Secondary | ICD-10-CM | POA: Diagnosis not present

## 2021-02-01 DIAGNOSIS — J441 Chronic obstructive pulmonary disease with (acute) exacerbation: Secondary | ICD-10-CM | POA: Diagnosis not present

## 2021-02-01 DIAGNOSIS — N1832 Chronic kidney disease, stage 3b: Secondary | ICD-10-CM | POA: Diagnosis not present

## 2021-02-01 LAB — BASIC METABOLIC PANEL
Anion gap: 6 (ref 5–15)
BUN: 39 mg/dL — ABNORMAL HIGH (ref 8–23)
CO2: 27 mmol/L (ref 22–32)
Calcium: 8.6 mg/dL — ABNORMAL LOW (ref 8.9–10.3)
Chloride: 97 mmol/L — ABNORMAL LOW (ref 98–111)
Creatinine, Ser: 1.85 mg/dL — ABNORMAL HIGH (ref 0.61–1.24)
GFR, Estimated: 38 mL/min — ABNORMAL LOW (ref >60)
Glucose, Bld: 210 mg/dL — ABNORMAL HIGH (ref 70–99)
Potassium: 4.8 mmol/L (ref 3.5–5.1)
Sodium: 130 mmol/L — ABNORMAL LOW (ref 135–145)

## 2021-02-01 LAB — CBC
HCT: 31.2 % — ABNORMAL LOW (ref 39.0–52.0)
Hemoglobin: 9.3 g/dL — ABNORMAL LOW (ref 13.0–17.0)
MCH: 22.6 pg — ABNORMAL LOW (ref 26.0–34.0)
MCHC: 29.8 g/dL — ABNORMAL LOW (ref 30.0–36.0)
MCV: 75.9 fL — ABNORMAL LOW (ref 80.0–100.0)
Platelets: 288 10*3/uL (ref 150–400)
RBC: 4.11 MIL/uL — ABNORMAL LOW (ref 4.22–5.81)
RDW: 18 % — ABNORMAL HIGH (ref 11.5–15.5)
WBC: 8.4 10*3/uL (ref 4.0–10.5)
nRBC: 0 % (ref 0.0–0.2)

## 2021-02-01 LAB — RESP PANEL BY RT-PCR (FLU A&B, COVID) ARPGX2
Influenza A by PCR: NEGATIVE
Influenza B by PCR: NEGATIVE
SARS Coronavirus 2 by RT PCR: NEGATIVE

## 2021-02-01 LAB — MAGNESIUM: Magnesium: 2.7 mg/dL — ABNORMAL HIGH (ref 1.7–2.4)

## 2021-02-01 LAB — GLUCOSE, CAPILLARY
Glucose-Capillary: 246 mg/dL — ABNORMAL HIGH (ref 70–99)
Glucose-Capillary: 295 mg/dL — ABNORMAL HIGH (ref 70–99)
Glucose-Capillary: 345 mg/dL — ABNORMAL HIGH (ref 70–99)
Glucose-Capillary: 289 mg/dL — ABNORMAL HIGH (ref 70–99)

## 2021-02-01 MED ORDER — FUROSEMIDE 10 MG/ML IJ SOLN
40.0000 mg | Freq: Once | INTRAMUSCULAR | Status: AC
  Administered 2021-02-01: 18:00:00 40 mg via INTRAVENOUS
  Filled 2021-02-01: qty 4

## 2021-02-01 MED ORDER — NICOTINE 21 MG/24HR TD PT24
21.0000 mg | MEDICATED_PATCH | Freq: Every day | TRANSDERMAL | Status: DC
  Administered 2021-02-02 – 2021-02-05 (×4): 21 mg via TRANSDERMAL
  Filled 2021-02-01 (×5): qty 1

## 2021-02-01 MED ORDER — HYDROXYZINE HCL 25 MG PO TABS
25.0000 mg | ORAL_TABLET | Freq: Three times a day (TID) | ORAL | Status: DC | PRN
  Administered 2021-02-02: 10:00:00 25 mg via ORAL
  Filled 2021-02-01 (×2): qty 1

## 2021-02-01 MED ORDER — INSULIN ASPART 100 UNIT/ML ~~LOC~~ SOLN
3.0000 [IU] | Freq: Three times a day (TID) | SUBCUTANEOUS | Status: DC
  Administered 2021-02-02 – 2021-02-05 (×11): 3 [IU] via SUBCUTANEOUS
  Filled 2021-02-01 (×11): qty 1

## 2021-02-01 MED ORDER — IPRATROPIUM-ALBUTEROL 0.5-2.5 (3) MG/3ML IN SOLN
3.0000 mL | RESPIRATORY_TRACT | Status: DC | PRN
  Administered 2021-02-01: 3 mL via RESPIRATORY_TRACT

## 2021-02-01 MED ORDER — IPRATROPIUM-ALBUTEROL 0.5-2.5 (3) MG/3ML IN SOLN
3.0000 mL | Freq: Three times a day (TID) | RESPIRATORY_TRACT | Status: DC
  Administered 2021-02-01 – 2021-02-02 (×2): 3 mL via RESPIRATORY_TRACT
  Filled 2021-02-01 (×2): qty 3

## 2021-02-01 NOTE — Progress Notes (Addendum)
Inpatient Diabetes Program Recommendations  AACE/ADA: New Consensus Statement on Inpatient Glycemic Control (2015)  Target Ranges:  Prepandial:   less than 140 mg/dL      Peak postprandial:   less than 180 mg/dL (1-2 hours)      Critically ill patients:  140 - 180 mg/dL   Results for [name], [name] (MRN [id number]) as of 02/01/2021 14:11  Ref. Range 01/31/2021 07:41 01/31/2021 08:25 01/31/2021 12:17 01/31/2021 16:10 01/31/2021 20:07  Glucose-Capillary Latest Ref Range: 70 - 99 mg/dL 68 (L) 114 (H) 157 (H) 180 (H) 269 (H)   Results for [name], [name] (MRN [id number]) as of 02/01/2021 14:11  Ref. Range 02/01/2021 09:05 02/01/2021 11:19  Glucose-Capillary Latest Ref Range: 70 - 99 mg/dL 246 (H) 295 (H)   Home DM Meds: Lantus 45 units QHS       Novolog 0-9 units TID with meals  Current Orders: Lantus 10 units QHS       Novolog 0-15 units TID    Prednisone increased to 40 mg Daily yest.   Lantus reduced to 10 units QHS last PM  CBGs elevated today  Please consider:  1. Increase Lantus slightly to 12 units QHS  2. Start Novolog Meal Coverage: Novolog 3 units TID with meals Hold if pt eats <50% of meal, Hold if pt NPO    --Will follow patient during hospitalization--  Wyn Quaker RN, MSN, CDE Diabetes Coordinator Inpatient Glycemic Control Team Team Pager: [phone] (8a-5p)

## 2021-02-01 NOTE — Progress Notes (Signed)
PROGRESS NOTE    [name]  HCW:[id number] DOB: [birth year] DOA: 01/28/2021 PCP: [name], MD   Chief complaint.  Shortness of breath. Brief Narrative:  [name] a 75 y.o.Caucasian malewith medical history significant formultiple medical problems including hypertension, CHF, type 2 diabetes mellitus, dyslipidemia and COPD, who presented to the emergency room with worsening dyspnea with associated cough productive of whitish sputum as well as wheezing over the last couple of days.  Patient also has a severe orthopnea, paroxysmal nocturnal dyspnea.  But he lost weight.  He also has leg edema.  Upon arriving the emergency room, he was placed on IV steroids and IV Lasix.  Most recent echocardiogram on 11/2020 showed ejection fraction 50 to 55%.   Assessment & Plan:   Active Problems:   Acute respiratory failure with hypoxia (HCC)   COPD with acute exacerbation (HCC)   Hyperglycemia due to type 2 diabetes mellitus (HCC)   Acute on chronic diastolic CHF (congestive heart failure) (HCC)   Acute renal failure superimposed on stage 3b chronic kidney disease (HCC)   Atrial fibrillation, chronic ([place])  #1. Acute respiratory failure with hypoxemia 2/2 COPD exacerbation. --Pt has known COPD and current smoker.  Respiratory failure more likely due to COPD, not CHF (considering recent Echo normal). --Patient was requiring BiPAP in the emergency room, weaned down to 1 L oxygen today.   --started on steroid Plan: --cont prednisone 40 mg daily --cont DuoNeb scheduled --Continue supplemental O2 to keep sats between 88-92%, wean as tolerated  Severe dyspnea --pt had worsening dyspnea this afternoon, kept saying he couldn't catch his breath, though sating well on room air. --CXR showed new ground-glass opacity Plan: --repeat COVID test --trial IV lasix 40 mg x1 --nightly CPAP --atarax PRN for possible anxiety-induced dyspnea  Current smoker --cessation advised --order nicotine  patch  CHF exacerbation ruled out --CHF exacerbation was dx on admission, and IV lasix 40 mg BID started, however,  Echo in Jan 2022 showed normal left systolic and diastolic function, and normal right systolic function.  CXR on presentation unremarkable.  I do not think CHF exacerbation was the likely cause of pt's dyspnea.    Mild troponin elevation, not clinically significant --trop 33 and 42, lower than prior readings  #2.  Acute kidney injury, POA on chronic kidney disease stage IIIb. --Cr 2.11 on presentation.  Baseline around 1.8  3.  Uncontrolled type 2 diabetes with hyperglycemia.  Severe hyperglycemia was due to IV steroids  --had episodes of hypoglycemia to 30's morning of 3/30, and 60's this morning even after Lantus reduced. --cont home Lantus at lower dose of 10u nightly --start mealtime 3u TID --SSI TID --no bed-time coverage  4.  Iron deficient anemia.   Borderline B12 level.  Normal folate. --s/p IV iron --cont oral iron supplement  5.  Chronic atrial fibrillation. Continue Eliquis.  6.  Essential hypertension.   --cont amlodipine, hydralazine, Imdur  Hypokalemia --monitor and replete PRN   DVT prophylaxis: VV:[id number] Code Status: Full code  Family Communication: wife updated at the bedside today Status is: inpatient Dispo:   The patient is from: home Anticipated d/c is to: home Anticipated d/c date is: 1-2 days Patient currently is not medically stable to d/c due to: new hypoxia, being treated for COPD exacerbation, still has significant respiratory distress   I/O last 3 completed shifts: In: 1260 [P.O.:1260] Out: 1400 [Urine:1400] Total I/O In: 75 [P.O.:720] Out: 275 [Urine:275]     Consultants:   None  Procedures: None  Antimicrobials: None  Subjective: Pt had DOE and needed 2L O2 while walking.  Around mid-afternoon, pt became severely short of breath, though sating well.  Dyspnea improved with brief CPAP.      Objective: Vitals:   02/01/21 0750 02/01/21 0906 02/01/21 1117 02/01/21 1517  BP:  (!) 138/40 (!) 151/76   Pulse: 60 (!) 57 73 82  Resp: 16 15 14  (!) 24  Temp:  98.7 F (37.1 C) 97.9 F (36.6 C)   TempSrc:  Oral    SpO2: 92% 93% 92% 92%  Weight:      Height:        Intake/Output Summary (Last 24 hours) at 02/01/2021 1645 Last data filed at 02/01/2021 1411 Gross per 24 hour  Intake 1200 ml  Output 575 ml  Net 625 ml   Filed Weights   01/30/21 0407 01/31/21 0526 01/31/21 1210  Weight: 72.5 kg 72.4 kg 72.4 kg    Examination:  Constitutional: In mild distress, AAOx3 HEENT: conjunctivae and lids normal, EOMI CV: No cyanosis.   RESP: increased RR, reduced lung sounds, no obvious crackles Extremities: signs of venous stasis in BLE, no significant swelling SKIN: warm, dry Neuro: II - XII grossly intact.   Psych: anxious mood and affect.     Data Reviewed: I have personally reviewed following labs and imaging studies  CBC: Recent Labs  Lab 01/28/21 0338 01/29/21 0517 01/30/21 0409 01/31/21 0438 02/01/21 0514  WBC 15.4* 12.7* 15.8* 10.8* 8.4  NEUTROABS 12.4*  --  13.1*  --   --   HGB 9.9* 9.0* 9.7* 10.0* 9.3*  HCT 32.9* 30.2* 32.9* 33.5* 31.2*  MCV 75.8* 76.3* 76.5* 76.3* 75.9*  PLT 336 290 317 301 119   Basic Metabolic Panel: Recent Labs  Lab 01/28/21 0338 01/29/21 0517 01/30/21 0409 01/31/21 0438 02/01/21 0514  NA 135 136 136 136 130*  K 3.8 4.5 4.2 4.5 4.8  CL 98 98 98 98 97*  CO2 25 28 28 29 27   GLUCOSE 334* 189* 32* 105* 210*  BUN 28* 36* 45* 42* 39*  CREATININE 2.11* 2.05* 2.14* 1.84* 1.85*  CALCIUM 8.9 8.9 8.8* 8.8* 8.6*  MG 2.4 2.6* 2.6* 2.9* 2.7*   GFR: Estimated Creatinine Clearance: 31.1 mL/min (A) (by C-G formula based on SCr of 1.85 mg/dL (H)). Liver Function Tests: Recent Labs  Lab 01/28/21 0338  AST 22  ALT 16  ALKPHOS 117  BILITOT 1.0  PROT 7.3  ALBUMIN 3.6   No results for input(s): LIPASE, AMYLASE in the last 168  hours. No results for input(s): AMMONIA in the last 168 hours. Coagulation Profile: No results for input(s): INR, PROTIME in the last 168 hours. Cardiac Enzymes: No results for input(s): CKTOTAL, CKMB, CKMBINDEX, TROPONINI in the last 168 hours. BNP (last 3 results) No results for input(s): PROBNP in the last 8760 hours. HbA1C: No results for input(s): HGBA1C in the last 72 hours. CBG: Recent Labs  Lab 01/31/21 1217 01/31/21 1610 01/31/21 2007 02/01/21 0905 02/01/21 1119  GLUCAP 157* 180* 269* 246* 295*   Lipid Profile: No results for input(s): CHOL, HDL, LDLCALC, TRIG, CHOLHDL, LDLDIRECT in the last 72 hours. Thyroid Function Tests: No results for input(s): TSH, T4TOTAL, FREET4, T3FREE, THYROIDAB in the last 72 hours. Anemia Panel: Recent Labs    01/31/21 0438  FOLATE 36.0   Sepsis Labs: No results for input(s): PROCALCITON, LATICACIDVEN in the last 168 hours.  Recent Results (from the past 240 hour(s))  Resp Panel by RT-PCR (Flu A&B, Covid)  Nasopharyngeal Swab     Status: None   Collection Time: 01/28/21  3:38 AM   Specimen: Nasopharyngeal Swab; Nasopharyngeal(NP) swabs in vial transport medium  Result Value Ref Range Status   SARS Coronavirus 2 by RT PCR NEGATIVE NEGATIVE Final    Comment: (NOTE) SARS-CoV-2 target nucleic acids are NOT DETECTED.  The SARS-CoV-2 RNA is generally detectable in upper respiratory specimens during the acute phase of infection. The lowest concentration of SARS-CoV-2 viral copies this assay can detect is 138 copies/mL. A negative result does not preclude SARS-Cov-2 infection and should not be used as the sole basis for treatment or other patient management decisions. A negative result may occur with  improper specimen collection/handling, submission of specimen other than nasopharyngeal swab, presence of viral mutation(s) within the areas targeted by this assay, and inadequate number of viral copies(<138 copies/mL). A negative result  must be combined with clinical observations, patient history, and epidemiological information. The expected result is Negative.  Fact Sheet for Patients:  EntrepreneurPulse.com.au  Fact Sheet for Healthcare Providers:  IncredibleEmployment.be  This test is no t yet approved or cleared by the [place] FDA and  has been authorized for detection and/or diagnosis of SARS-CoV-2 by FDA under an Emergency Use Authorization (EUA). This EUA will remain  in effect (meaning this test can be used) for the duration of the COVID-19 declaration under Section 564(b)(1) of the Act, 21 U.S.C.section 360bbb-3(b)(1), unless the authorization is terminated  or revoked sooner.       Influenza A by PCR NEGATIVE NEGATIVE Final   Influenza B by PCR NEGATIVE NEGATIVE Final    Comment: (NOTE) The Xpert Xpress SARS-CoV-2/FLU/RSV plus assay is intended as an aid in the diagnosis of influenza from Nasopharyngeal swab specimens and should not be used as a sole basis for treatment. Nasal washings and aspirates are unacceptable for Xpert Xpress SARS-CoV-2/FLU/RSV testing.  Fact Sheet for Patients: EntrepreneurPulse.com.au  Fact Sheet for Healthcare Providers: IncredibleEmployment.be  This test is not yet approved or cleared by the [place] FDA and has been authorized for detection and/or diagnosis of SARS-CoV-2 by FDA under an Emergency Use Authorization (EUA). This EUA will remain in effect (meaning this test can be used) for the duration of the COVID-19 declaration under Section 564(b)(1) of the Act, 21 U.S.C. section 360bbb-3(b)(1), unless the authorization is terminated or revoked.  Performed at [facility], [address], [place], [place] [address]          Radiology Studies: DG Chest [place] 1 View  Result Date: 02/01/2021 CLINICAL DATA:  Respiratory distress EXAM: PORTABLE CHEST 1 VIEW COMPARISON:   01/28/2021, 11/15/2020, 10/25/2020, 09/26/2019 FINDINGS: Post sternotomy changes. Diffuse bilateral interstitial opacity, some of which is felt secondary to chronic disease. There is slight increased ground-glass opacity bilaterally suspicious for superimposed acute edema or possible pneumonia. Blunting at the left costophrenic sulcus probably chronic and due to scarring. Mild cardiomegaly with aortic atherosclerosis. No pneumothorax. IMPRESSION: Suspect some degree of chronic interstitial changes. However there is increased interstitial and ground-glass opacity bilaterally suspicious for superimposed acute edema or possible pneumonia. Mild cardiomegaly. Electronically Signed   By: Donavan Foil M.D.   On: 02/01/2021 15:35        Scheduled Meds: . acidophilus  1 capsule Oral q morning  . amLODipine  5 mg Oral Daily  . apixaban  5 mg Oral BID  . vitamin C  500 mg Oral BID  . cholecalciferol  1,000 Units Oral Daily  . furosemide  40  mg Intravenous Once  . guaiFENesin  600 mg Oral BID  . hydrALAZINE  50 mg Oral Q8H  . insulin aspart  0-15 Units Subcutaneous TID WC  . insulin glargine  10 Units Subcutaneous QHS  . ipratropium-albuterol  3 mL Nebulization TID  . iron polysaccharides  150 mg Oral Daily  . isosorbide mononitrate  30 mg Oral Daily  . lactase  3,000 Units Oral TID WC  . multivitamin with minerals  1 tablet Oral Daily  . nicotine  21 mg Transdermal Daily  . pantoprazole  40 mg Oral Daily  . potassium chloride  10 mEq Oral Daily  . potassium chloride  40 mEq Oral Once  . predniSONE  40 mg Oral Q breakfast  . Ensure Max Protein  11 oz Oral BID BM  . simvastatin  20 mg Oral QHS  . traZODone  50 mg Oral QHS   Continuous Infusions:    LOS: 4 days    Enzo Bi, MD [facility]   To contact the attending provider between 7A-7P or the covering provider during after hours 7P-7A, please log into the web site [email or web address] and access using universal  password for  that web site. If you do not have the password, please call the hospital operator.  02/01/2021, 4:45 PM

## 2021-02-01 NOTE — Progress Notes (Signed)
SATURATION QUALIFICATIONS: (This note is used to comply with regulatory documentation for home oxygen)  Patient Saturations on Room Air at Rest = 91%  Patient Saturations on Room Air while Ambulating = 87%  Patient Saturations on 2 Liters of oxygen while Ambulating = 93%  Please briefly explain why patient needs home oxygen:Pt needs o2 to facilitate ADL to include ambulation

## 2021-02-01 NOTE — Progress Notes (Signed)
cpap declined by pt 

## 2021-02-01 NOTE — Progress Notes (Signed)
Met with the patient and his wife at the bedside, he has the scale in his room to take home and has other DME at home already, he will be getting Oxygen to take home with him, He goes to cardiac rehab so [facility] is not needed. He stated that he has everything that he needs at this time

## 2021-02-02 ENCOUNTER — Inpatient Hospital Stay: Payer: PPO

## 2021-02-02 DIAGNOSIS — J441 Chronic obstructive pulmonary disease with (acute) exacerbation: Secondary | ICD-10-CM | POA: Diagnosis not present

## 2021-02-02 DIAGNOSIS — J9601 Acute respiratory failure with hypoxia: Secondary | ICD-10-CM | POA: Diagnosis not present

## 2021-02-02 LAB — CBC
HCT: 31.8 % — ABNORMAL LOW (ref 39.0–52.0)
Hemoglobin: 9.7 g/dL — ABNORMAL LOW (ref 13.0–17.0)
MCH: 23.4 pg — ABNORMAL LOW (ref 26.0–34.0)
MCHC: 30.5 g/dL (ref 30.0–36.0)
MCV: 76.6 fL — ABNORMAL LOW (ref 80.0–100.0)
Platelets: 299 10*3/uL (ref 150–400)
RBC: 4.15 MIL/uL — ABNORMAL LOW (ref 4.22–5.81)
RDW: 18.9 % — ABNORMAL HIGH (ref 11.5–15.5)
WBC: 9.3 10*3/uL (ref 4.0–10.5)
nRBC: 0 % (ref 0.0–0.2)

## 2021-02-02 LAB — BASIC METABOLIC PANEL
Anion gap: 9 (ref 5–15)
BUN: 42 mg/dL — ABNORMAL HIGH (ref 8–23)
CO2: 26 mmol/L (ref 22–32)
Calcium: 8.7 mg/dL — ABNORMAL LOW (ref 8.9–10.3)
Chloride: 97 mmol/L — ABNORMAL LOW (ref 98–111)
Creatinine, Ser: 1.98 mg/dL — ABNORMAL HIGH (ref 0.61–1.24)
GFR, Estimated: 35 mL/min — ABNORMAL LOW (ref >60)
Glucose, Bld: 254 mg/dL — ABNORMAL HIGH (ref 70–99)
Potassium: 4.5 mmol/L (ref 3.5–5.1)
Sodium: 132 mmol/L — ABNORMAL LOW (ref 135–145)

## 2021-02-02 LAB — GLUCOSE, CAPILLARY
Glucose-Capillary: 204 mg/dL — ABNORMAL HIGH (ref 70–99)
Glucose-Capillary: 247 mg/dL — ABNORMAL HIGH (ref 70–99)
Glucose-Capillary: 137 mg/dL — ABNORMAL HIGH (ref 70–99)
Glucose-Capillary: 323 mg/dL — ABNORMAL HIGH (ref 70–99)

## 2021-02-02 LAB — MAGNESIUM: Magnesium: 2.7 mg/dL — ABNORMAL HIGH (ref 1.7–2.4)

## 2021-02-02 LAB — PROCALCITONIN: Procalcitonin: 0.15 ng/mL

## 2021-02-02 MED ORDER — ALPRAZOLAM 0.25 MG PO TABS
0.2500 mg | ORAL_TABLET | Freq: Three times a day (TID) | ORAL | Status: DC | PRN
  Administered 2021-02-02 – 2021-02-04 (×5): 0.25 mg via ORAL
  Filled 2021-02-02 (×5): qty 1

## 2021-02-02 MED ORDER — IPRATROPIUM-ALBUTEROL 0.5-2.5 (3) MG/3ML IN SOLN: 3.0000 mL | Freq: Two times a day (BID) | RESPIRATORY_TRACT | Status: DC

## 2021-02-02 MED ORDER — IPRATROPIUM-ALBUTEROL 0.5-2.5 (3) MG/3ML IN SOLN
3.0000 mL | Freq: Four times a day (QID) | RESPIRATORY_TRACT | Status: DC
  Administered 2021-02-02 – 2021-02-03 (×4): 3 mL via RESPIRATORY_TRACT
  Filled 2021-02-02 (×4): qty 3

## 2021-02-02 MED ORDER — AZITHROMYCIN 250 MG PO TABS
500.0000 mg | ORAL_TABLET | Freq: Every day | ORAL | Status: DC
  Administered 2021-02-02 – 2021-02-05 (×4): 500 mg via ORAL
  Filled 2021-02-02 (×4): qty 1

## 2021-02-02 MED ORDER — ALPRAZOLAM 0.5 MG PO TABS
0.5000 mg | ORAL_TABLET | Freq: Two times a day (BID) | ORAL | Status: DC
  Administered 2021-02-02: 0.5 mg via ORAL
  Filled 2021-02-02: qty 1

## 2021-02-02 MED ORDER — IPRATROPIUM-ALBUTEROL 0.5-2.5 (3) MG/3ML IN SOLN: 3.0000 mL | RESPIRATORY_TRACT | Status: DC | PRN

## 2021-02-02 MED ORDER — FUROSEMIDE 10 MG/ML IJ SOLN
60.0000 mg | Freq: Once | INTRAMUSCULAR | Status: AC
  Administered 2021-02-02: 60 mg via INTRAVENOUS
  Filled 2021-02-02: qty 6

## 2021-02-02 NOTE — Significant Event (Signed)
Rapid Response Event Note   Reason for Call : called RRT  for pt with resp distress   Initial Focused Assessment: Sitting up in bed, bipap already in place by respiratory who is at bedside. Noted refused nicotine patch and cpap over night... per Dr Billie Ruddy who is now at bedside.      Interventions: Dr Billie Ruddy at bedside.Stat CXR, Stat CT, PRN breathing tx added, Xanax ordered. Pt allowed nicotine patch to be placed, and xanax to be given. Waited a few minutes and took bipap off and put back to 3L [place]. Sister at bedside.   Plan of Care: as above, Brandi RN to call for further assistance     Event Summary:   MD Notified: Dr Billie Ruddy [address] [place], RN

## 2021-02-02 NOTE — Progress Notes (Signed)
PROGRESS NOTE    [name]  TFT:[id number] DOB: Jan 26, 1946 DOA: 01/28/2021 PCP: [name], MD   Chief complaint.  Shortness of breath. Brief Narrative:  [name] a 75 y.o.Caucasian malewith medical history significant formultiple medical problems including hypertension, CHF, type 2 diabetes mellitus, dyslipidemia and COPD, who presented to the emergency room with worsening dyspnea with associated cough productive of whitish sputum as well as wheezing over the last couple of days.  Patient also has a severe orthopnea, paroxysmal nocturnal dyspnea.  But he lost weight.  He also has leg edema.  Upon arriving the emergency room, he was placed on IV steroids and IV Lasix.  Most recent echocardiogram on 11/2020 showed ejection fraction 50 to 55%.   Assessment & Plan:   Active Problems:   Acute respiratory failure with hypoxia (HCC)   COPD with acute exacerbation (HCC)   Hyperglycemia due to type 2 diabetes mellitus (HCC)   Acute on chronic diastolic CHF (congestive heart failure) (HCC)   Acute renal failure superimposed on stage 3b chronic kidney disease (HCC)   Atrial fibrillation, chronic ([place])  #1. Acute respiratory failure with hypoxemia 2/2 COPD exacerbation. --Pt has known COPD and current smoker.  Respiratory failure more likely due to COPD, not CHF (considering recent Echo normal). --Patient was requiring BiPAP in the emergency room, weaned down to 1 L oxygen today.   --started on steroid Plan: --cont prednisone 40 mg daily --cont DuoNeb QID scheduled --Continue supplemental O2 to keep sats between 88-92%, wean as tolerated  Episodic severe dyspnea --episodes where pt said he couldn't catch his breath, though sating well on room air. --CXR showed new ground-glass opacity --repeat COVID test neg.  IV lasix 40 mg x1 did not seem to help nor preventing another episode --possibly due to anxiety or cigarette withdrawal Plan: --obtain procal, 0.15 low --repeat IV lasix  40 x1 --start azithromycin --Xanax PRN  --nightly CPAP  Current smoker Possible nicotine withdrawal and anxiety attacks --cessation advised --Pt had refused nicotine patch initially --Nicotine patch applied today by RN  CHF exacerbation ruled out --CHF exacerbation was dx on admission, and IV lasix 40 mg BID started, however,  Echo in Jan 2022 showed normal left systolic and diastolic function, and normal right systolic function.  CXR on presentation unremarkable.  I do not think CHF exacerbation was the likely cause of pt's dyspnea.    Mild troponin elevation, not clinically significant --trop 33 and 42, lower than prior readings  #2.  Acute kidney injury, POA on chronic kidney disease stage IIIb. --Cr 2.11 on presentation.  Baseline around 1.8  3.  Uncontrolled type 2 diabetes with hyperglycemia.  Severe hyperglycemia was due to IV steroids  --had episodes of hypoglycemia to 30's morning of 3/30, and 60's this morning even after Lantus reduced. --cont home Lantus at lower dose of 10u nightly --cont mealtime 3u TID --SSI TID --no bed-time coverage  4.  Iron deficient anemia.   Borderline B12 level.  Normal folate. --s/p IV iron --cont oral iron supplement  5.  Chronic atrial fibrillation. Continue Eliquis.  6.  Essential hypertension.   --cont amlodipine, hydralazine, Imdur  Hypokalemia --monitor and replete PRN   DVT prophylaxis: HC:WCBJSEG Code Status: Full code  Family Communication: sister updated at the bedside today Status is: inpatient Dispo:   The patient is from: home Anticipated d/c is to: home Anticipated d/c date is: 2-3 days Patient currently is not medically stable to d/c due to: new hypoxia, being treated for COPD exacerbation,  still has significant respiratory distress   I/O last 3 completed shifts: In: 1200 [P.O.:1200] Out: 42 [Urine:576] Total I/O In: -  Out: 300 [Urine:300]     Consultants:   None  Procedures:  None  Antimicrobials: None  Subjective: Pt had another sudden episode of dyspnea, where he said he couldn't catch his breath, and was put on CPAP, similar to yesterday. Rapid response called.  Dyspnea improved with just brief stay on CPAP.  Pt given Xanax, and was fast asleep.   Objective: Vitals:   02/02/21 0741 02/02/21 0755 02/02/21 1035 02/02/21 1212  BP:  (!) 151/49  (!) 160/57  Pulse:  71  70  Resp:  16  20  Temp:  98.7 F (37.1 C)  98.2 F (36.8 C)  TempSrc:  Oral  Oral  SpO2: 96% 98% 94% 94%  Weight:      Height:        Intake/Output Summary (Last 24 hours) at 02/02/2021 1442 Last data filed at 02/02/2021 8588 Gross per 24 hour  Intake --  Output 301 ml  Net -301 ml   Filed Weights   01/30/21 0407 01/31/21 0526 01/31/21 1210  Weight: 72.5 kg 72.4 kg 72.4 kg    Examination:  Constitutional: NAD, AAOx3 HEENT: conjunctivae and lids normal, EOMI CV: No cyanosis.   RESP: on CPAP SKIN: warm, dry  Neuro: II - XII grossly intact.     Data Reviewed: I have personally reviewed following labs and imaging studies  CBC: Recent Labs  Lab 01/28/21 0338 01/29/21 0517 01/30/21 0409 01/31/21 0438 02/01/21 0514 02/02/21 0507  WBC 15.4* 12.7* 15.8* 10.8* 8.4 9.3  NEUTROABS 12.4*  --  13.1*  --   --   --   HGB 9.9* 9.0* 9.7* 10.0* 9.3* 9.7*  HCT 32.9* 30.2* 32.9* 33.5* 31.2* 31.8*  MCV 75.8* 76.3* 76.5* 76.3* 75.9* 76.6*  PLT 336 290 317 301 288 502   Basic Metabolic Panel: Recent Labs  Lab 01/29/21 0517 01/30/21 0409 01/31/21 0438 02/01/21 0514 02/02/21 0507  NA 136 136 136 130* 132*  K 4.5 4.2 4.5 4.8 4.5  CL 98 98 98 97* 97*  CO2 28 28 29 27 26   GLUCOSE 189* 32* 105* 210* 254*  BUN 36* 45* 42* 39* 42*  CREATININE 2.05* 2.14* 1.84* 1.85* 1.98*  CALCIUM 8.9 8.8* 8.8* 8.6* 8.7*  MG 2.6* 2.6* 2.9* 2.7* 2.7*   GFR: Estimated Creatinine Clearance: 29.1 mL/min (A) (by C-G formula based on SCr of 1.98 mg/dL (H)). Liver Function Tests: Recent Labs  Lab  01/28/21 0338  AST 22  ALT 16  ALKPHOS 117  BILITOT 1.0  PROT 7.3  ALBUMIN 3.6   No results for input(s): LIPASE, AMYLASE in the last 168 hours. No results for input(s): AMMONIA in the last 168 hours. Coagulation Profile: No results for input(s): INR, PROTIME in the last 168 hours. Cardiac Enzymes: No results for input(s): CKTOTAL, CKMB, CKMBINDEX, TROPONINI in the last 168 hours. BNP (last 3 results) No results for input(s): PROBNP in the last 8760 hours. HbA1C: No results for input(s): HGBA1C in the last 72 hours. CBG: Recent Labs  Lab 02/01/21 1119 02/01/21 1709 02/01/21 2141 02/02/21 0756 02/02/21 1212  GLUCAP 295* 345* 289* 204* 247*   Lipid Profile: No results for input(s): CHOL, HDL, LDLCALC, TRIG, CHOLHDL, LDLDIRECT in the last 72 hours. Thyroid Function Tests: No results for input(s): TSH, T4TOTAL, FREET4, T3FREE, THYROIDAB in the last 72 hours. Anemia Panel: Recent Labs    01/31/21  5009  FOLATE 36.0   Sepsis Labs: No results for input(s): PROCALCITON, LATICACIDVEN in the last 168 hours.  Recent Results (from the past 240 hour(s))  Resp Panel by RT-PCR (Flu A&B, Covid) Nasopharyngeal Swab     Status: None   Collection Time: 01/28/21  3:38 AM   Specimen: Nasopharyngeal Swab; Nasopharyngeal(NP) swabs in vial transport medium  Result Value Ref Range Status   SARS Coronavirus 2 by RT PCR NEGATIVE NEGATIVE Final    Comment: (NOTE) SARS-CoV-2 target nucleic acids are NOT DETECTED.  The SARS-CoV-2 RNA is generally detectable in upper respiratory specimens during the acute phase of infection. The lowest concentration of SARS-CoV-2 viral copies this assay can detect is 138 copies/mL. A negative result does not preclude SARS-Cov-2 infection and should not be used as the sole basis for treatment or other patient management decisions. A negative result may occur with  improper specimen collection/handling, submission of specimen other than nasopharyngeal swab,  presence of viral mutation(s) within the areas targeted by this assay, and inadequate number of viral copies(<138 copies/mL). A negative result must be combined with clinical observations, patient history, and epidemiological information. The expected result is Negative.  Fact Sheet for Patients:  EntrepreneurPulse.com.au  Fact Sheet for Healthcare Providers:  IncredibleEmployment.be  This test is no t yet approved or cleared by the [place] FDA and  has been authorized for detection and/or diagnosis of SARS-CoV-2 by FDA under an Emergency Use Authorization (EUA). This EUA will remain  in effect (meaning this test can be used) for the duration of the COVID-19 declaration under Section 564(b)(1) of the Act, 21 U.S.C.section 360bbb-3(b)(1), unless the authorization is terminated  or revoked sooner.       Influenza A by PCR NEGATIVE NEGATIVE Final   Influenza B by PCR NEGATIVE NEGATIVE Final    Comment: (NOTE) The Xpert Xpress SARS-CoV-2/FLU/RSV plus assay is intended as an aid in the diagnosis of influenza from Nasopharyngeal swab specimens and should not be used as a sole basis for treatment. Nasal washings and aspirates are unacceptable for Xpert Xpress SARS-CoV-2/FLU/RSV testing.  Fact Sheet for Patients: EntrepreneurPulse.com.au  Fact Sheet for Healthcare Providers: IncredibleEmployment.be  This test is not yet approved or cleared by the [place] FDA and has been authorized for detection and/or diagnosis of SARS-CoV-2 by FDA under an Emergency Use Authorization (EUA). This EUA will remain in effect (meaning this test can be used) for the duration of the COVID-19 declaration under Section 564(b)(1) of the Act, 21 U.S.C. section 360bbb-3(b)(1), unless the authorization is terminated or revoked.  Performed at [facility], [address] [place], [place] [address]   Resp Panel by  RT-PCR (Flu A&B, Covid) Nasopharyngeal Swab     Status: None   Collection Time: 02/01/21  7:31 PM   Specimen: Nasopharyngeal Swab; Nasopharyngeal(NP) swabs in vial transport medium  Result Value Ref Range Status   SARS Coronavirus 2 by RT PCR NEGATIVE NEGATIVE Final    Comment: (NOTE) SARS-CoV-2 target nucleic acids are NOT DETECTED.  The SARS-CoV-2 RNA is generally detectable in upper respiratory specimens during the acute phase of infection. The lowest concentration of SARS-CoV-2 viral copies this assay can detect is 138 copies/mL. A negative result does not preclude SARS-Cov-2 infection and should not be used as the sole basis for treatment or other patient management decisions. A negative result may occur with  improper specimen collection/handling, submission of specimen other than nasopharyngeal swab, presence of viral mutation(s) within the areas targeted by this assay,  and inadequate number of viral copies(<138 copies/mL). A negative result must be combined with clinical observations, patient history, and epidemiological information. The expected result is Negative.  Fact Sheet for Patients:  EntrepreneurPulse.com.au  Fact Sheet for Healthcare Providers:  IncredibleEmployment.be  This test is no t yet approved or cleared by the [place] FDA and  has been authorized for detection and/or diagnosis of SARS-CoV-2 by FDA under an Emergency Use Authorization (EUA). This EUA will remain  in effect (meaning this test can be used) for the duration of the COVID-19 declaration under Section 564(b)(1) of the Act, 21 U.S.C.section 360bbb-3(b)(1), unless the authorization is terminated  or revoked sooner.       Influenza A by PCR NEGATIVE NEGATIVE Final   Influenza B by PCR NEGATIVE NEGATIVE Final    Comment: (NOTE) The Xpert Xpress SARS-CoV-2/FLU/RSV plus assay is intended as an aid in the diagnosis of influenza from Nasopharyngeal swab  specimens and should not be used as a sole basis for treatment. Nasal washings and aspirates are unacceptable for Xpert Xpress SARS-CoV-2/FLU/RSV testing.  Fact Sheet for Patients: EntrepreneurPulse.com.au  Fact Sheet for Healthcare Providers: IncredibleEmployment.be  This test is not yet approved or cleared by the [place] FDA and has been authorized for detection and/or diagnosis of SARS-CoV-2 by FDA under an Emergency Use Authorization (EUA). This EUA will remain in effect (meaning this test can be used) for the duration of the COVID-19 declaration under Section 564(b)(1) of the Act, 21 U.S.C. section 360bbb-3(b)(1), unless the authorization is terminated or revoked.  Performed at [facility], [address], [place], [place] [address]          Radiology Studies: CT CHEST WO CONTRAST  Result Date: 02/02/2021 CLINICAL DATA:  Respiratory failure EXAM: CT CHEST WITHOUT CONTRAST TECHNIQUE: Multidetector CT imaging of the chest was performed following the standard protocol without IV contrast. COMPARISON:  Chest radiograph 02/01/2021 FINDINGS: Cardiovascular: Coronary artery calcification and aortic atherosclerotic calcification. Post CABG Mediastinum/Nodes: No axillary supraclavicular adenopathy. Enlarged mediastinal lymph nodes are present. For example 17 mm RIGHT lower paratracheal node. 16 mm subcarinal node. Probable mild hilar adenopathy on the RIGHT. Lungs/Pleura: Respiratory motion degrades the lung imaging. Centrilobular emphysema the upper lobes. There bilateral small to moderate pleural effusions greater on the RIGHT. There is bibasilar passive atelectasis. Loculated pleural fluid at the LEFT lung base. There is diffuse ground-glass densities in the RIGHT lower lobe suggesting pulmonary infection or edema. Small amount of mucoid material in the trachea at the level of the carina. Upper Abdomen: Limited view of the liver,  kidneys, pancreas are unremarkable. Normal adrenal glands. Musculoskeletal: Midline sternotomy. IMPRESSION: 1. Bilateral small to moderate pleural effusions greater on the RIGHT. 2. Bibasilar atelectasis. Round atelectasis and loculated fluid at the LEFT lung base. 3. Diffuse ground-glass densities in the RIGHT lower lobe suggest early pneumonia, asymmetric edema or pneumonitis. No focal consolidation. 4. Centrilobular emphysema the upper lobes. 5. Mild mediastinal adenopathy is favored reactive related to COPD. Electronically Signed   By: Suzy Bouchard M.D.   On: 02/02/2021 11:44   DG Chest Port 1 View  Result Date: 02/02/2021 CLINICAL DATA:  Acute respiratory failure EXAM: PORTABLE CHEST 1 VIEW COMPARISON:  Yesterday FINDINGS: Cardiomegaly. Prior CABG. Generous lung volumes. Diffuse interstitial opacity with cephalized blood flow and Kerley lines. The degree of opacity of is above prior baseline. Small bilateral pleural effusion. IMPRESSION: CHF pattern superimposed on a background of chronic lung disease. Electronically Signed   By: Neva Seat.D.  On: 02/02/2021 11:15   DG Chest Port 1 View  Result Date: 02/01/2021 CLINICAL DATA:  Respiratory distress EXAM: PORTABLE CHEST 1 VIEW COMPARISON:  01/28/2021, 11/15/2020, 10/25/2020, 09/26/2019 FINDINGS: Post sternotomy changes. Diffuse bilateral interstitial opacity, some of which is felt secondary to chronic disease. There is slight increased ground-glass opacity bilaterally suspicious for superimposed acute edema or possible pneumonia. Blunting at the left costophrenic sulcus probably chronic and due to scarring. Mild cardiomegaly with aortic atherosclerosis. No pneumothorax. IMPRESSION: Suspect some degree of chronic interstitial changes. However there is increased interstitial and ground-glass opacity bilaterally suspicious for superimposed acute edema or possible pneumonia. Mild cardiomegaly. Electronically Signed   By: Donavan Foil M.D.   On:  02/01/2021 15:35        Scheduled Meds: . acidophilus  1 capsule Oral q morning  . amLODipine  5 mg Oral Daily  . apixaban  5 mg Oral BID  . vitamin C  500 mg Oral BID  . azithromycin  500 mg Oral Daily  . cholecalciferol  1,000 Units Oral Daily  . furosemide  60 mg Intravenous Once  . guaiFENesin  600 mg Oral BID  . hydrALAZINE  50 mg Oral Q8H  . insulin aspart  0-15 Units Subcutaneous TID WC  . insulin aspart  3 Units Subcutaneous TID WC  . insulin glargine  10 Units Subcutaneous QHS  . ipratropium-albuterol  3 mL Nebulization QID  . iron polysaccharides  150 mg Oral Daily  . isosorbide mononitrate  30 mg Oral Daily  . lactase  3,000 Units Oral TID WC  . multivitamin with minerals  1 tablet Oral Daily  . nicotine  21 mg Transdermal Daily  . pantoprazole  40 mg Oral Daily  . potassium chloride  10 mEq Oral Daily  . potassium chloride  40 mEq Oral Once  . predniSONE  40 mg Oral Q breakfast  . Ensure Max Protein  11 oz Oral BID BM  . simvastatin  20 mg Oral QHS  . traZODone  50 mg Oral QHS   Continuous Infusions:    LOS: 5 days    Enzo Bi, MD [facility]   To contact the attending provider between 7A-7P or the covering provider during after hours 7P-7A, please log into the web site [email or web address] and access using universal [place] password for that web site. If you do not have the password, please call the hospital operator.  02/02/2021, 2:42 PM

## 2021-02-03 DIAGNOSIS — J441 Chronic obstructive pulmonary disease with (acute) exacerbation: Secondary | ICD-10-CM | POA: Diagnosis not present

## 2021-02-03 DIAGNOSIS — J9601 Acute respiratory failure with hypoxia: Secondary | ICD-10-CM | POA: Diagnosis not present

## 2021-02-03 LAB — CBC
HCT: 29.9 % — ABNORMAL LOW (ref 39.0–52.0)
Hemoglobin: 8.8 g/dL — ABNORMAL LOW (ref 13.0–17.0)
MCH: 23 pg — ABNORMAL LOW (ref 26.0–34.0)
MCHC: 29.4 g/dL — ABNORMAL LOW (ref 30.0–36.0)
MCV: 78.1 fL — ABNORMAL LOW (ref 80.0–100.0)
Platelets: 293 10*3/uL (ref 150–400)
RBC: 3.83 MIL/uL — ABNORMAL LOW (ref 4.22–5.81)
RDW: 19.4 % — ABNORMAL HIGH (ref 11.5–15.5)
WBC: 9 10*3/uL (ref 4.0–10.5)
nRBC: 0 % (ref 0.0–0.2)

## 2021-02-03 LAB — BASIC METABOLIC PANEL
Anion gap: 7 (ref 5–15)
BUN: 45 mg/dL — ABNORMAL HIGH (ref 8–23)
CO2: 27 mmol/L (ref 22–32)
Calcium: 8.7 mg/dL — ABNORMAL LOW (ref 8.9–10.3)
Chloride: 99 mmol/L (ref 98–111)
Creatinine, Ser: 1.87 mg/dL — ABNORMAL HIGH (ref 0.61–1.24)
GFR, Estimated: 37 mL/min — ABNORMAL LOW (ref >60)
Glucose, Bld: 327 mg/dL — ABNORMAL HIGH (ref 70–99)
Potassium: 4.7 mmol/L (ref 3.5–5.1)
Sodium: 133 mmol/L — ABNORMAL LOW (ref 135–145)

## 2021-02-03 LAB — GLUCOSE, CAPILLARY
Glucose-Capillary: 248 mg/dL — ABNORMAL HIGH (ref 70–99)
Glucose-Capillary: 184 mg/dL — ABNORMAL HIGH (ref 70–99)
Glucose-Capillary: 302 mg/dL — ABNORMAL HIGH (ref 70–99)
Glucose-Capillary: 266 mg/dL — ABNORMAL HIGH (ref 70–99)

## 2021-02-03 LAB — MAGNESIUM: Magnesium: 2.6 mg/dL — ABNORMAL HIGH (ref 1.7–2.4)

## 2021-02-03 MED ORDER — IPRATROPIUM-ALBUTEROL 0.5-2.5 (3) MG/3ML IN SOLN
3.0000 mL | Freq: Three times a day (TID) | RESPIRATORY_TRACT | Status: DC
  Administered 2021-02-03 – 2021-02-05 (×7): 3 mL via RESPIRATORY_TRACT
  Filled 2021-02-03 (×7): qty 3

## 2021-02-03 MED ORDER — FUROSEMIDE 10 MG/ML IJ SOLN
60.0000 mg | Freq: Once | INTRAMUSCULAR | Status: AC
  Administered 2021-02-03: 11:00:00 60 mg via INTRAVENOUS
  Filled 2021-02-03: qty 6

## 2021-02-03 NOTE — Progress Notes (Signed)
PROGRESS NOTE    [name]  ZOX:[id number] DOB: 29-Aug-1946 DOA: 01/28/2021 PCP: [name], MD   Chief complaint.  Shortness of breath. Brief Narrative:  [name] a 75 y.o.Caucasian malewith medical history significant formultiple medical problems including hypertension, CHF, type 2 diabetes mellitus, dyslipidemia and COPD, who presented to the emergency room with worsening dyspnea with associated cough productive of whitish sputum as well as wheezing over the last couple of days.  Patient also has a severe orthopnea, paroxysmal nocturnal dyspnea.  But he lost weight.  He also has leg edema.  Upon arriving the emergency room, he was placed on IV steroids and IV Lasix.  Most recent echocardiogram on 11/2020 showed ejection fraction 50 to 55%.   Assessment & Plan:   Active Problems:   Acute respiratory failure with hypoxia (HCC)   COPD with acute exacerbation (HCC)   Hyperglycemia due to type 2 diabetes mellitus (HCC)   Acute on chronic diastolic CHF (congestive heart failure) (HCC)   Acute renal failure superimposed on stage 3b chronic kidney disease (HCC)   Atrial fibrillation, chronic ([place])  #1. Acute respiratory failure with hypoxemia 2/2 COPD exacerbation. --Pt has known COPD and current smoker.  Respiratory failure more likely due to COPD, not CHF (considering recent Echo normal). --Patient was requiring BiPAP in the emergency room, weaned down to 1 L oxygen today.   --started on steroid Plan: --cont azithromycin --cont prednisone 40 mg daily --cont DuoNeb QID scheduled --Continue supplemental O2 to keep sats between 88-92%, wean as tolerated --repeat IV lasix 40 x1  Episodic severe dyspnea likely due to anxiety --episodes where pt said he couldn't catch his breath, though sating well on room air. --CXR showed new ground-glass opacity --repeat COVID test neg.  IV lasix 40 mg x1 did not seem to help nor preventing another episode --possibly due to anxiety or  cigarette withdrawal --obtain procal, 0.15 low Plan: --low-dose Xanax PRN --nightly CPAP  Current smoker Possible nicotine withdrawal and anxiety attacks --cessation advised --Pt had refused nicotine patch initially, then it was applied during rapid call --cont nicotine patch  CHF exacerbation ruled out --CHF exacerbation was dx on admission, and IV lasix 40 mg BID started, however,  Echo in Jan 2022 showed normal left systolic and diastolic function, and normal right systolic function.  CXR on presentation unremarkable.  I do not think CHF exacerbation was the likely cause of pt's dyspnea.    Mild troponin elevation, not clinically significant --trop 33 and 42, lower than prior readings  #2.  Acute kidney injury, POA on chronic kidney disease stage IIIb. --Cr 2.11 on presentation.  Baseline around 1.8  3.  Uncontrolled type 2 diabetes with hyperglycemia.  Severe hyperglycemia was due to IV steroids  --had episodes of hypoglycemia to 30's morning of 3/30, and 60's this morning even after Lantus reduced. --cont home Lantus at lower dose of 10u nightly --cont mealtime 3u TID --SSI TID --no bed-time coverage  4.  Iron deficient anemia.   Borderline B12 level.  Normal folate. --s/p IV iron --cont oral iron supplement  5.  Chronic atrial fibrillation. Continue Eliquis.  6.  Essential hypertension.   --cont amlodipine, hydralazine, Imdur --IV lasix 40 mg x1   Hypokalemia --monitor and replete PRN   DVT prophylaxis: WJ:[id number] Code Status: Full code  Family Communication: wife updated at the bedside today Status is: inpatient Dispo:   The patient is from: home Anticipated d/c is to: home Anticipated d/c date is: 1-2 days Patient currently is  not medically stable to d/c due to: new hypoxia, being treated for COPD exacerbation, still has significant respiratory distress   I/O last 3 completed shifts: In: 240 [P.O.:240] Out: 900 [Urine:900] Total I/O In: -  Out:  500 [Urine:500]     Consultants:   None  Procedures: None  Antimicrobials: None  Subjective: No episode of severe short of breath today.  Ate his meals.     Objective: Vitals:   02/03/21 0726 02/03/21 0732 02/03/21 0739 02/03/21 1155  BP:  (!) 161/50  (!) 151/68  Pulse:  (!) 43 (!) 53 (!) 43  Resp:  16  18  Temp:  97.7 F (36.5 C)  (!) 97.5 F (36.4 C)  TempSrc:  Oral  Oral  SpO2: 99% 100%  100%  Weight:      Height:        Intake/Output Summary (Last 24 hours) at 02/03/2021 1446 Last data filed at 02/03/2021 1300 Gross per 24 hour  Intake 240 ml  Output 1100 ml  Net -860 ml   Filed Weights   01/30/21 0407 01/31/21 0526 01/31/21 1210  Weight: 72.5 kg 72.4 kg 72.4 kg    Examination:  Constitutional: NAD, AAOx3 HEENT: conjunctivae and lids normal, EOMI CV: No cyanosis.   RESP: normal respiratory effort, crackles at bases, on 3L Extremities: No effusions, edema in BLE SKIN: warm, dry Neuro: II - XII grossly intact.     Data Reviewed: I have personally reviewed following labs and imaging studies  CBC: Recent Labs  Lab 01/28/21 0338 01/29/21 0517 01/30/21 0409 01/31/21 0438 02/01/21 0514 02/02/21 0507 02/03/21 0452  WBC 15.4*   < > 15.8* 10.8* 8.4 9.3 9.0  NEUTROABS 12.4*  --  13.1*  --   --   --   --   HGB 9.9*   < > 9.7* 10.0* 9.3* 9.7* 8.8*  HCT 32.9*   < > 32.9* 33.5* 31.2* 31.8* 29.9*  MCV 75.8*   < > 76.5* 76.3* 75.9* 76.6* 78.1*  PLT 336   < > 317 301 288 299 293   < > = values in this interval not displayed.   Basic Metabolic Panel: Recent Labs  Lab 01/30/21 0409 01/31/21 0438 02/01/21 0514 02/02/21 0507 02/03/21 0452  NA 136 136 130* 132* 133*  K 4.2 4.5 4.8 4.5 4.7  CL 98 98 97* 97* 99  CO2 28 29 27 26 27   GLUCOSE 32* 105* 210* 254* 327*  BUN 45* 42* 39* 42* 45*  CREATININE 2.14* 1.84* 1.85* 1.98* 1.87*  CALCIUM 8.8* 8.8* 8.6* 8.7* 8.7*  MG 2.6* 2.9* 2.7* 2.7* 2.6*   GFR: Estimated Creatinine Clearance: 30.8 mL/min (A) (by  C-G formula based on SCr of 1.87 mg/dL (H)). Liver Function Tests: Recent Labs  Lab 01/28/21 0338  AST 22  ALT 16  ALKPHOS 117  BILITOT 1.0  PROT 7.3  ALBUMIN 3.6   No results for input(s): LIPASE, AMYLASE in the last 168 hours. No results for input(s): AMMONIA in the last 168 hours. Coagulation Profile: No results for input(s): INR, PROTIME in the last 168 hours. Cardiac Enzymes: No results for input(s): CKTOTAL, CKMB, CKMBINDEX, TROPONINI in the last 168 hours. BNP (last 3 results) No results for input(s): PROBNP in the last 8760 hours. HbA1C: No results for input(s): HGBA1C in the last 72 hours. CBG: Recent Labs  Lab 02/02/21 1212 02/02/21 1545 02/02/21 2116 02/03/21 0942 02/03/21 1207  GLUCAP 247* 137* 323* 248* 184*   Lipid Profile: No results for  input(s): CHOL, HDL, LDLCALC, TRIG, CHOLHDL, LDLDIRECT in the last 72 hours. Thyroid Function Tests: No results for input(s): TSH, T4TOTAL, FREET4, T3FREE, THYROIDAB in the last 72 hours. Anemia Panel: No results for input(s): VITAMINB12, FOLATE, FERRITIN, TIBC, IRON, RETICCTPCT in the last 72 hours. Sepsis Labs: Recent Labs  Lab 02/02/21 0507  PROCALCITON 0.15    Recent Results (from the past 240 hour(s))  Resp Panel by RT-PCR (Flu A&B, Covid) Nasopharyngeal Swab     Status: None   Collection Time: 01/28/21  3:38 AM   Specimen: Nasopharyngeal Swab; Nasopharyngeal(NP) swabs in vial transport medium  Result Value Ref Range Status   SARS Coronavirus 2 by RT PCR NEGATIVE NEGATIVE Final    Comment: (NOTE) SARS-CoV-2 target nucleic acids are NOT DETECTED.  The SARS-CoV-2 RNA is generally detectable in upper respiratory specimens during the acute phase of infection. The lowest concentration of SARS-CoV-2 viral copies this assay can detect is 138 copies/mL. A negative result does not preclude SARS-Cov-2 infection and should not be used as the sole basis for treatment or other patient management decisions. A negative  result may occur with  improper specimen collection/handling, submission of specimen other than nasopharyngeal swab, presence of viral mutation(s) within the areas targeted by this assay, and inadequate number of viral copies(<138 copies/mL). A negative result must be combined with clinical observations, patient history, and epidemiological information. The expected result is Negative.  Fact Sheet for Patients:  EntrepreneurPulse.com.au  Fact Sheet for Healthcare Providers:  IncredibleEmployment.be  This test is no t yet approved or cleared by the [place] FDA and  has been authorized for detection and/or diagnosis of SARS-CoV-2 by FDA under an Emergency Use Authorization (EUA). This EUA will remain  in effect (meaning this test can be used) for the duration of the COVID-19 declaration under Section 564(b)(1) of the Act, 21 U.S.C.section 360bbb-3(b)(1), unless the authorization is terminated  or revoked sooner.       Influenza A by PCR NEGATIVE NEGATIVE Final   Influenza B by PCR NEGATIVE NEGATIVE Final    Comment: (NOTE) The Xpert Xpress SARS-CoV-2/FLU/RSV plus assay is intended as an aid in the diagnosis of influenza from Nasopharyngeal swab specimens and should not be used as a sole basis for treatment. Nasal washings and aspirates are unacceptable for Xpert Xpress SARS-CoV-2/FLU/RSV testing.  Fact Sheet for Patients: EntrepreneurPulse.com.au  Fact Sheet for Healthcare Providers: IncredibleEmployment.be  This test is not yet approved or cleared by the [place] FDA and has been authorized for detection and/or diagnosis of SARS-CoV-2 by FDA under an Emergency Use Authorization (EUA). This EUA will remain in effect (meaning this test can be used) for the duration of the COVID-19 declaration under Section 564(b)(1) of the Act, 21 U.S.C. section 360bbb-3(b)(1), unless the authorization is  terminated or revoked.  Performed at [facility], [address] [place], [place] [address]   Resp Panel by RT-PCR (Flu A&B, Covid) Nasopharyngeal Swab     Status: None   Collection Time: 02/01/21  7:31 PM   Specimen: Nasopharyngeal Swab; Nasopharyngeal(NP) swabs in vial transport medium  Result Value Ref Range Status   SARS Coronavirus 2 by RT PCR NEGATIVE NEGATIVE Final    Comment: (NOTE) SARS-CoV-2 target nucleic acids are NOT DETECTED.  The SARS-CoV-2 RNA is generally detectable in upper respiratory specimens during the acute phase of infection. The lowest concentration of SARS-CoV-2 viral copies this assay can detect is 138 copies/mL. A negative result does not preclude SARS-Cov-2 infection and should not be  used as the sole basis for treatment or other patient management decisions. A negative result may occur with  improper specimen collection/handling, submission of specimen other than nasopharyngeal swab, presence of viral mutation(s) within the areas targeted by this assay, and inadequate number of viral copies(<138 copies/mL). A negative result must be combined with clinical observations, patient history, and epidemiological information. The expected result is Negative.  Fact Sheet for Patients:  EntrepreneurPulse.com.au  Fact Sheet for Healthcare Providers:  IncredibleEmployment.be  This test is no t yet approved or cleared by the [place] FDA and  has been authorized for detection and/or diagnosis of SARS-CoV-2 by FDA under an Emergency Use Authorization (EUA). This EUA will remain  in effect (meaning this test can be used) for the duration of the COVID-19 declaration under Section 564(b)(1) of the Act, 21 U.S.C.section 360bbb-3(b)(1), unless the authorization is terminated  or revoked sooner.       Influenza A by PCR NEGATIVE NEGATIVE Final   Influenza B by PCR NEGATIVE NEGATIVE Final    Comment: (NOTE) The  Xpert Xpress SARS-CoV-2/FLU/RSV plus assay is intended as an aid in the diagnosis of influenza from Nasopharyngeal swab specimens and should not be used as a sole basis for treatment. Nasal washings and aspirates are unacceptable for Xpert Xpress SARS-CoV-2/FLU/RSV testing.  Fact Sheet for Patients: EntrepreneurPulse.com.au  Fact Sheet for Healthcare Providers: IncredibleEmployment.be  This test is not yet approved or cleared by the [place] FDA and has been authorized for detection and/or diagnosis of SARS-CoV-2 by FDA under an Emergency Use Authorization (EUA). This EUA will remain in effect (meaning this test can be used) for the duration of the COVID-19 declaration under Section 564(b)(1) of the Act, 21 U.S.C. section 360bbb-3(b)(1), unless the authorization is terminated or revoked.  Performed at [facility], [address] [place], [place] [address]          Radiology Studies: CT CHEST WO CONTRAST  Result Date: 02/02/2021 CLINICAL DATA:  Respiratory failure EXAM: CT CHEST WITHOUT CONTRAST TECHNIQUE: Multidetector CT imaging of the chest was performed following the standard protocol without IV contrast. COMPARISON:  Chest radiograph 02/01/2021 FINDINGS: Cardiovascular: Coronary artery calcification and aortic atherosclerotic calcification. Post CABG Mediastinum/Nodes: No axillary supraclavicular adenopathy. Enlarged mediastinal lymph nodes are present. For example 17 mm RIGHT lower paratracheal node. 16 mm subcarinal node. Probable mild hilar adenopathy on the RIGHT. Lungs/Pleura: Respiratory motion degrades the lung imaging. Centrilobular emphysema the upper lobes. There bilateral small to moderate pleural effusions greater on the RIGHT. There is bibasilar passive atelectasis. Loculated pleural fluid at the LEFT lung base. There is diffuse ground-glass densities in the RIGHT lower lobe suggesting pulmonary infection or edema.  Small amount of mucoid material in the trachea at the level of the carina. Upper Abdomen: Limited view of the liver, kidneys, pancreas are unremarkable. Normal adrenal glands. Musculoskeletal: Midline sternotomy. IMPRESSION: 1. Bilateral small to moderate pleural effusions greater on the RIGHT. 2. Bibasilar atelectasis. Round atelectasis and loculated fluid at the LEFT lung base. 3. Diffuse ground-glass densities in the RIGHT lower lobe suggest early pneumonia, asymmetric edema or pneumonitis. No focal consolidation. 4. Centrilobular emphysema the upper lobes. 5. Mild mediastinal adenopathy is favored reactive related to COPD. Electronically Signed   By: Suzy Bouchard M.D.   On: 02/02/2021 11:44   DG Chest Port 1 View  Result Date: 02/02/2021 CLINICAL DATA:  Acute respiratory failure EXAM: PORTABLE CHEST 1 VIEW COMPARISON:  Yesterday FINDINGS: Cardiomegaly. Prior CABG. Generous lung volumes. Diffuse interstitial opacity  with cephalized blood flow and Kerley lines. The degree of opacity of is above prior baseline. Small bilateral pleural effusion. IMPRESSION: CHF pattern superimposed on a background of chronic lung disease. Electronically Signed   By: Monte Fantasia M.D.   On: 02/02/2021 11:15   DG Chest Port 1 View  Result Date: 02/01/2021 CLINICAL DATA:  Respiratory distress EXAM: PORTABLE CHEST 1 VIEW COMPARISON:  01/28/2021, 11/15/2020, 10/25/2020, 09/26/2019 FINDINGS: Post sternotomy changes. Diffuse bilateral interstitial opacity, some of which is felt secondary to chronic disease. There is slight increased ground-glass opacity bilaterally suspicious for superimposed acute edema or possible pneumonia. Blunting at the left costophrenic sulcus probably chronic and due to scarring. Mild cardiomegaly with aortic atherosclerosis. No pneumothorax. IMPRESSION: Suspect some degree of chronic interstitial changes. However there is increased interstitial and ground-glass opacity bilaterally suspicious for  superimposed acute edema or possible pneumonia. Mild cardiomegaly. Electronically Signed   By: Donavan Foil M.D.   On: 02/01/2021 15:35        Scheduled Meds: . acidophilus  1 capsule Oral q morning  . amLODipine  5 mg Oral Daily  . apixaban  5 mg Oral BID  . vitamin C  500 mg Oral BID  . azithromycin  500 mg Oral Daily  . cholecalciferol  1,000 Units Oral Daily  . guaiFENesin  600 mg Oral BID  . hydrALAZINE  50 mg Oral Q8H  . insulin aspart  0-15 Units Subcutaneous TID WC  . insulin aspart  3 Units Subcutaneous TID WC  . insulin glargine  10 Units Subcutaneous QHS  . ipratropium-albuterol  3 mL Nebulization TID  . iron polysaccharides  150 mg Oral Daily  . isosorbide mononitrate  30 mg Oral Daily  . lactase  3,000 Units Oral TID WC  . multivitamin with minerals  1 tablet Oral Daily  . nicotine  21 mg Transdermal Daily  . pantoprazole  40 mg Oral Daily  . potassium chloride  10 mEq Oral Daily  . potassium chloride  40 mEq Oral Once  . predniSONE  40 mg Oral Q breakfast  . Ensure Max Protein  11 oz Oral BID BM  . simvastatin  20 mg Oral QHS  . traZODone  50 mg Oral QHS   Continuous Infusions:    LOS: 6 days    Enzo Bi, MD [facility]   To contact the attending provider between 7A-7P or the covering provider during after hours 7P-7A, please log into the web site [email or web address] and access using universal  password for that web site. If you do not have the password, please call the hospital operator.  02/03/2021, 2:46 PM

## 2021-02-03 NOTE — Plan of Care (Signed)

## 2021-02-04 DIAGNOSIS — N1832 Chronic kidney disease, stage 3b: Secondary | ICD-10-CM | POA: Diagnosis not present

## 2021-02-04 DIAGNOSIS — J441 Chronic obstructive pulmonary disease with (acute) exacerbation: Secondary | ICD-10-CM | POA: Diagnosis not present

## 2021-02-04 DIAGNOSIS — N178 Other acute kidney failure: Secondary | ICD-10-CM | POA: Diagnosis not present

## 2021-02-04 LAB — BASIC METABOLIC PANEL
Anion gap: 7 (ref 5–15)
BUN: 42 mg/dL — ABNORMAL HIGH (ref 8–23)
CO2: 28 mmol/L (ref 22–32)
Calcium: 9 mg/dL (ref 8.9–10.3)
Chloride: 99 mmol/L (ref 98–111)
Creatinine, Ser: 1.74 mg/dL — ABNORMAL HIGH (ref 0.61–1.24)
GFR, Estimated: 40 mL/min — ABNORMAL LOW (ref >60)
Glucose, Bld: 274 mg/dL — ABNORMAL HIGH (ref 70–99)
Potassium: 4.7 mmol/L (ref 3.5–5.1)
Sodium: 134 mmol/L — ABNORMAL LOW (ref 135–145)

## 2021-02-04 LAB — CBC
HCT: 32.9 % — ABNORMAL LOW (ref 39.0–52.0)
Hemoglobin: 9.6 g/dL — ABNORMAL LOW (ref 13.0–17.0)
MCH: 22.9 pg — ABNORMAL LOW (ref 26.0–34.0)
MCHC: 29.2 g/dL — ABNORMAL LOW (ref 30.0–36.0)
MCV: 78.3 fL — ABNORMAL LOW (ref 80.0–100.0)
Platelets: 308 10*3/uL (ref 150–400)
RBC: 4.2 MIL/uL — ABNORMAL LOW (ref 4.22–5.81)
RDW: 19.7 % — ABNORMAL HIGH (ref 11.5–15.5)
WBC: 12 10*3/uL — ABNORMAL HIGH (ref 4.0–10.5)
nRBC: 0 % (ref 0.0–0.2)

## 2021-02-04 LAB — GLUCOSE, CAPILLARY
Glucose-Capillary: 248 mg/dL — ABNORMAL HIGH (ref 70–99)
Glucose-Capillary: 285 mg/dL — ABNORMAL HIGH (ref 70–99)
Glucose-Capillary: 215 mg/dL — ABNORMAL HIGH (ref 70–99)
Glucose-Capillary: 234 mg/dL — ABNORMAL HIGH (ref 70–99)
Glucose-Capillary: 300 mg/dL — ABNORMAL HIGH (ref 70–99)
Glucose-Capillary: 386 mg/dL — ABNORMAL HIGH (ref 70–99)

## 2021-02-04 LAB — MAGNESIUM: Magnesium: 2.4 mg/dL (ref 1.7–2.4)

## 2021-02-04 MED ORDER — INSULIN GLARGINE 100 UNIT/ML ~~LOC~~ SOLN
15.0000 [IU] | Freq: Every day | SUBCUTANEOUS | Status: DC
  Administered 2021-02-04: 15 [IU] via SUBCUTANEOUS
  Filled 2021-02-04 (×2): qty 0.15

## 2021-02-04 MED ORDER — FUROSEMIDE 10 MG/ML IJ SOLN
40.0000 mg | Freq: Once | INTRAMUSCULAR | Status: AC
  Administered 2021-02-04: 21:00:00 40 mg via INTRAVENOUS
  Filled 2021-02-04: qty 4

## 2021-02-04 NOTE — Progress Notes (Signed)
PROGRESS NOTE    [name]  QIH:[id number] DOB: [birth year] DOA: 01/28/2021 PCP: [name], MD   Chief complaint.  Shortness of breath. Brief Narrative:  [name] a 75 y.o.Caucasian malewith medical history significant formultiple medical problems including hypertension, CHF, type 2 diabetes mellitus, dyslipidemia and COPD, who presented to the emergency room with worsening dyspnea with associated cough productive of whitish sputum as well as wheezing over the last couple of days.  Patient also has a severe orthopnea, paroxysmal nocturnal dyspnea.  But he lost weight.  He also has leg edema.  Upon arriving the emergency room, he was placed on IV steroids and IV Lasix.  Most recent echocardiogram on 11/2020 showed ejection fraction 50 to 55%.   Assessment & Plan:   Active Problems:   Acute respiratory failure with hypoxia (HCC)   COPD with acute exacerbation (HCC)   Hyperglycemia due to type 2 diabetes mellitus (HCC)   Acute on chronic diastolic CHF (congestive heart failure) (HCC)   Acute renal failure superimposed on stage 3b chronic kidney disease (HCC)   Atrial fibrillation, chronic ([place])  #1. Acute respiratory failure with hypoxemia 2/2 COPD exacerbation. --Pt has known COPD and current smoker.  Respiratory failure more likely due to COPD, not CHF (considering recent Echo normal). --Patient was requiring BiPAP in the emergency room, weaned down to 1 L oxygen today.   --started on steroid Plan: --cont azithromycin --cont prednisone 40 mg daily --cont DuoNeb QID scheduled --Continue supplemental O2 to keep sats between 88-92%, wean as tolerated --repeat IV lasix 40 mg x1 today  Episodic severe dyspnea likely due to anxiety --episodes where pt said he couldn't catch his breath, though sating well on room air. --CXR showed new ground-glass opacity --repeat COVID test neg.  IV lasix 40 mg x1 did not seem to help nor preventing another episode --possibly due to anxiety  or cigarette withdrawal --obtain procal, 0.15 low Plan: --low-dose Xanax PRN --cont nicotine patch --nightly CPAP  Current smoker Possible nicotine withdrawal and anxiety attacks --cessation advised --Pt had refused nicotine patch initially, then it was applied during rapid call --cont nicotine patch  CHF exacerbation ruled out --CHF exacerbation was dx on admission, and IV lasix 40 mg BID started, however,  Echo in Jan 2022 showed normal left systolic and diastolic function, and normal right systolic function.  CXR on presentation unremarkable.  I do not think CHF exacerbation was the likely cause of pt's dyspnea.    Mild troponin elevation, not clinically significant --trop 33 and 42, lower than prior readings  Acute kidney injury, POA, improved on chronic kidney disease stage IIIb. --Cr 2.11 on presentation.  Baseline around 1.8  3.  Uncontrolled type 2 diabetes with hyperglycemia.  Severe hyperglycemia was due to IV steroids  --had episodes of hypoglycemia to 30's morning of 3/30, and 60's next morning even after Lantus reduced. Plan: --cont home Lantus at 15u nightly --cont mealtime 3u TID --SSI TID --no bed-time coverage  4.  Iron deficient anemia.   Borderline B12 level.  Normal folate. --s/p IV iron --cont oral iron supplement  5.  Chronic atrial fibrillation. Continue Eliquis.  Essential hypertension.   --cont amlodipine, Imdur --IV lasix 40 mg x1  --Hold home hydralazine  Hypokalemia --monitor and replete PRN  Severe deconditioning --PT eval   DVT prophylaxis: OV:FIEPPIR Code Status: Full code  Family Communication: wife updated at the bedside today Status is: inpatient Dispo:   The patient is from: home Anticipated d/c is to: home Anticipated d/c  date is: 1-2 days Patient currently is not medically stable to d/c due to: new hypoxia, being treated for COPD exacerbation, still has significant respiratory distress   I/O last 3 completed  shifts: In: 240 [P.O.:240] Out: 1635 [Urine:1635] Total I/O In: 240 [P.O.:240] Out: 250 [Urine:250]     Consultants:   None  Procedures: None  Antimicrobials: None  Subjective: Pt reported feeling and breathing better today, at rest, however, when up and walking, pt had DOE and needed frequent rest to catch his breath.  Ate well today.   Objective: Vitals:   02/04/21 0517 02/04/21 0738 02/04/21 0825 02/04/21 1200  BP: (!) 177/70  (!) 163/56 102/80  Pulse: (!) 50  (!) 56 60  Resp: 17  18 16   Temp: 97.9 F (36.6 C)  97.8 F (36.6 C) 98.4 F (36.9 C)  TempSrc: Oral   Oral  SpO2: 100% 99% 97% 100%  Weight:      Height:        Intake/Output Summary (Last 24 hours) at 02/04/2021 1612 Last data filed at 02/04/2021 1013 Gross per 24 hour  Intake 240 ml  Output 825 ml  Net -585 ml   Filed Weights   01/30/21 0407 01/31/21 0526 01/31/21 1210  Weight: 72.5 kg 72.4 kg 72.4 kg    Examination:  Constitutional: NAD, AAOx3 HEENT: conjunctivae and lids normal, EOMI CV: No cyanosis.   RESP: normal respiratory effort, crackles at bases, on 3L Extremities: No effusions, edema in BLE SKIN: warm, dry Neuro: II - XII grossly intact.     Data Reviewed: I have personally reviewed following labs and imaging studies  CBC: Recent Labs  Lab 01/30/21 0409 01/31/21 0438 02/01/21 0514 02/02/21 0507 02/03/21 0452 02/04/21 0548  WBC 15.8* 10.8* 8.4 9.3 9.0 12.0*  NEUTROABS 13.1*  --   --   --   --   --   HGB 9.7* 10.0* 9.3* 9.7* 8.8* 9.6*  HCT 32.9* 33.5* 31.2* 31.8* 29.9* 32.9*  MCV 76.5* 76.3* 75.9* 76.6* 78.1* 78.3*  PLT 317 301 288 299 293 027   Basic Metabolic Panel: Recent Labs  Lab 01/31/21 0438 02/01/21 0514 02/02/21 0507 02/03/21 0452 02/04/21 0548  NA 136 130* 132* 133* 134*  K 4.5 4.8 4.5 4.7 4.7  CL 98 97* 97* 99 99  CO2 29 27 26 27 28   GLUCOSE 105* 210* 254* 327* 274*  BUN 42* 39* 42* 45* 42*  CREATININE 1.84* 1.85* 1.98* 1.87* 1.74*  CALCIUM 8.8*  8.6* 8.7* 8.7* 9.0  MG 2.9* 2.7* 2.7* 2.6* 2.4   GFR: Estimated Creatinine Clearance: 33.1 mL/min (A) (by C-G formula based on SCr of 1.74 mg/dL (H)). Liver Function Tests: No results for input(s): AST, ALT, ALKPHOS, BILITOT, PROT, ALBUMIN in the last 168 hours. No results for input(s): LIPASE, AMYLASE in the last 168 hours. No results for input(s): AMMONIA in the last 168 hours. Coagulation Profile: No results for input(s): INR, PROTIME in the last 168 hours. Cardiac Enzymes: No results for input(s): CKTOTAL, CKMB, CKMBINDEX, TROPONINI in the last 168 hours. BNP (last 3 results) No results for input(s): PROBNP in the last 8760 hours. HbA1C: No results for input(s): HGBA1C in the last 72 hours. CBG: Recent Labs  Lab 02/03/21 2056 02/04/21 0750 02/04/21 0947 02/04/21 1156 02/04/21 1558  GLUCAP 266* 248* 285* 215* 234*   Lipid Profile: No results for input(s): CHOL, HDL, LDLCALC, TRIG, CHOLHDL, LDLDIRECT in the last 72 hours. Thyroid Function Tests: No results for input(s): TSH, T4TOTAL, FREET4,  T3FREE, THYROIDAB in the last 72 hours. Anemia Panel: No results for input(s): VITAMINB12, FOLATE, FERRITIN, TIBC, IRON, RETICCTPCT in the last 72 hours. Sepsis Labs: Recent Labs  Lab 02/02/21 0507  PROCALCITON 0.15    Recent Results (from the past 240 hour(s))  Resp Panel by RT-PCR (Flu A&B, Covid) Nasopharyngeal Swab     Status: None   Collection Time: 01/28/21  3:38 AM   Specimen: Nasopharyngeal Swab; Nasopharyngeal(NP) swabs in vial transport medium  Result Value Ref Range Status   SARS Coronavirus 2 by RT PCR NEGATIVE NEGATIVE Final    Comment: (NOTE) SARS-CoV-2 target nucleic acids are NOT DETECTED.  The SARS-CoV-2 RNA is generally detectable in upper respiratory specimens during the acute phase of infection. The lowest concentration of SARS-CoV-2 viral copies this assay can detect is 138 copies/mL. A negative result does not preclude SARS-Cov-2 infection and should  not be used as the sole basis for treatment or other patient management decisions. A negative result may occur with  improper specimen collection/handling, submission of specimen other than nasopharyngeal swab, presence of viral mutation(s) within the areas targeted by this assay, and inadequate number of viral copies(<138 copies/mL). A negative result must be combined with clinical observations, patient history, and epidemiological information. The expected result is Negative.  Fact Sheet for Patients:  EntrepreneurPulse.com.au  Fact Sheet for Healthcare Providers:  IncredibleEmployment.be  This test is no t yet approved or cleared by the [place] FDA and  has been authorized for detection and/or diagnosis of SARS-CoV-2 by FDA under an Emergency Use Authorization (EUA). This EUA will remain  in effect (meaning this test can be used) for the duration of the COVID-19 declaration under Section 564(b)(1) of the Act, 21 U.S.C.section 360bbb-3(b)(1), unless the authorization is terminated  or revoked sooner.       Influenza A by PCR NEGATIVE NEGATIVE Final   Influenza B by PCR NEGATIVE NEGATIVE Final    Comment: (NOTE) The Xpert Xpress SARS-CoV-2/FLU/RSV plus assay is intended as an aid in the diagnosis of influenza from Nasopharyngeal swab specimens and should not be used as a sole basis for treatment. Nasal washings and aspirates are unacceptable for Xpert Xpress SARS-CoV-2/FLU/RSV testing.  Fact Sheet for Patients: EntrepreneurPulse.com.au  Fact Sheet for Healthcare Providers: IncredibleEmployment.be  This test is not yet approved or cleared by the [place] FDA and has been authorized for detection and/or diagnosis of SARS-CoV-2 by FDA under an Emergency Use Authorization (EUA). This EUA will remain in effect (meaning this test can be used) for the duration of the COVID-19 declaration under  Section 564(b)(1) of the Act, 21 U.S.C. section 360bbb-3(b)(1), unless the authorization is terminated or revoked.  Performed at [facility], [address] [place], [place] [address]   Resp Panel by RT-PCR (Flu A&B, Covid) Nasopharyngeal Swab     Status: None   Collection Time: 02/01/21  7:31 PM   Specimen: Nasopharyngeal Swab; Nasopharyngeal(NP) swabs in vial transport medium  Result Value Ref Range Status   SARS Coronavirus 2 by RT PCR NEGATIVE NEGATIVE Final    Comment: (NOTE) SARS-CoV-2 target nucleic acids are NOT DETECTED.  The SARS-CoV-2 RNA is generally detectable in upper respiratory specimens during the acute phase of infection. The lowest concentration of SARS-CoV-2 viral copies this assay can detect is 138 copies/mL. A negative result does not preclude SARS-Cov-2 infection and should not be used as the sole basis for treatment or other patient management decisions. A negative result may occur with  improper specimen collection/handling,  submission of specimen other than nasopharyngeal swab, presence of viral mutation(s) within the areas targeted by this assay, and inadequate number of viral copies(<138 copies/mL). A negative result must be combined with clinical observations, patient history, and epidemiological information. The expected result is Negative.  Fact Sheet for Patients:  EntrepreneurPulse.com.au  Fact Sheet for Healthcare Providers:  IncredibleEmployment.be  This test is no t yet approved or cleared by the [place] FDA and  has been authorized for detection and/or diagnosis of SARS-CoV-2 by FDA under an Emergency Use Authorization (EUA). This EUA will remain  in effect (meaning this test can be used) for the duration of the COVID-19 declaration under Section 564(b)(1) of the Act, 21 U.S.C.section 360bbb-3(b)(1), unless the authorization is terminated  or revoked sooner.       Influenza A by PCR  NEGATIVE NEGATIVE Final   Influenza B by PCR NEGATIVE NEGATIVE Final    Comment: (NOTE) The Xpert Xpress SARS-CoV-2/FLU/RSV plus assay is intended as an aid in the diagnosis of influenza from Nasopharyngeal swab specimens and should not be used as a sole basis for treatment. Nasal washings and aspirates are unacceptable for Xpert Xpress SARS-CoV-2/FLU/RSV testing.  Fact Sheet for Patients: EntrepreneurPulse.com.au  Fact Sheet for Healthcare Providers: IncredibleEmployment.be  This test is not yet approved or cleared by the [place] FDA and has been authorized for detection and/or diagnosis of SARS-CoV-2 by FDA under an Emergency Use Authorization (EUA). This EUA will remain in effect (meaning this test can be used) for the duration of the COVID-19 declaration under Section 564(b)(1) of the Act, 21 U.S.C. section 360bbb-3(b)(1), unless the authorization is terminated or revoked.  Performed at [facility], [address] [place],  [address]          Radiology Studies: No results found.      Scheduled Meds: . acidophilus  1 capsule Oral q morning  . amLODipine  5 mg Oral Daily  . apixaban  5 mg Oral BID  . vitamin C  500 mg Oral BID  . azithromycin  500 mg Oral Daily  . cholecalciferol  1,000 Units Oral Daily  . guaiFENesin  600 mg Oral BID  . hydrALAZINE  50 mg Oral Q8H  . insulin aspart  0-15 Units Subcutaneous TID WC  . insulin aspart  3 Units Subcutaneous TID WC  . insulin glargine  10 Units Subcutaneous QHS  . ipratropium-albuterol  3 mL Nebulization TID  . iron polysaccharides  150 mg Oral Daily  . isosorbide mononitrate  30 mg Oral Daily  . lactase  3,000 Units Oral TID WC  . multivitamin with minerals  1 tablet Oral Daily  . nicotine  21 mg Transdermal Daily  . pantoprazole  40 mg Oral Daily  . potassium chloride  10 mEq Oral Daily  . potassium chloride  40 mEq Oral Once  . predniSONE  40 mg  Oral Q breakfast  . Ensure Max Protein  11 oz Oral BID BM  . simvastatin  20 mg Oral QHS  . traZODone  50 mg Oral QHS   Continuous Infusions:    LOS: 7 days    Enzo Bi, MD [facility]   To contact the attending provider between 7A-7P or the covering provider during after hours 7P-7A, please log into the web site [email or web address] and access using universal [place] password for that web site. If you do not have the password, please call the hospital operator.  02/04/2021, 4:12 PM

## 2021-02-04 NOTE — Progress Notes (Signed)
Inpatient Diabetes Program Recommendations  AACE/ADA: New Consensus Statement on Inpatient Glycemic Control   Target Ranges:  Prepandial:   less than 140 mg/dL      Peak postprandial:   less than 180 mg/dL (1-2 hours)      Critically ill patients:  140 - 180 mg/dL   Results for [name], [name] (MRN [id number]) as of 02/04/2021 09:28  Ref. Range 02/03/2021 09:42 02/03/2021 12:07 02/03/2021 17:03 02/03/2021 20:56 02/04/2021 07:50  Glucose-Capillary Latest Ref Range: 70 - 99 mg/dL 248 (H) 184 (H) 302 (H) 266 (H) 248 (H)   Review of Glycemic Control  Diabetes history: DM2 Outpatient Diabetes medications: Lantus 45 units QHS, Novolog 0-9 units TID with meals Current orders for Inpatient glycemic control: Lantus 10 units QHS, Novolog 3 units TID with meals, Novolog 0-15 units TID with meals; Prednisone 40 mg QAM  Inpatient Diabetes Program Recommendations:    Insulin: If steroids are continued as ordered, please consider increasing Lantus to 13 units QHS and meal coverage to Novolog 5 units TID with meals if patient eats at least 50% of meals.  Thanks, Barnie Alderman, RN, MSN, CDE Diabetes Coordinator Inpatient Diabetes Program [phone] (Team Pager from 8am to 5pm)

## 2021-02-04 NOTE — TOC Progression Note (Signed)
Transition of Care [facility]) - Progression Note    Patient Details  Name: [name] MRN: [id number] Date of Birth: 1945/11/07  Transition of Care [facility]) CM/SW [place], RN Phone Number: 02/04/2021, 10:44 AM  Clinical Narrative:  TOC in to see patient at bedside-purpose of visit:  Discharge planning.  Patient states he plans to go home upon discharge, has no concerns about getting to appointments or getting medications, as spouse can assist with these.  Patient has neighbors who can look in on him if spouse needs assistance.  O2 and scale at bedside, states he is aware of Cardiac Rehab and amenable to plan.  No further concerns or questions at this time.  TOC contact information given.  TOC will follow through discharge.     Expected Discharge Plan: Home/Self Care Barriers to Discharge: Continued Medical Work up  Expected Discharge Plan and Services Expected Discharge Plan: Home/Self Care       Living arrangements for the past 2 months: Single Family Home                 DME Arranged: Oxygen DME Agency: [employer] Date DME Agency Contacted: 02/01/21 Time DME Agency Contacted: [phone] Representative spoke with at DME Agency: [place] Determinants of Health ([place]) Interventions    Readmission Risk Interventions Readmission Risk Prevention Plan 02/04/2021 10/28/2020 12/27/2019  Transportation Screening Complete Complete Complete  PCP or Specialist Appt within 3-5 Days - - Complete  HRI or [place] - - Complete  Medication Review (RN Care Manager) Complete Complete Complete  PCP or Specialist appointment within 3-5 days of discharge Complete Complete -  [place] or Home Care Consult Complete Complete -  SW Recovery Care/Counseling Consult Complete - -  Palliative Care Screening Not Applicable Not Applicable -  [place] Not Applicable Not Applicable -  Some recent data might be hidden

## 2021-02-05 ENCOUNTER — Encounter: Payer: Self-pay | Admitting: *Deleted

## 2021-02-05 DIAGNOSIS — I5022 Chronic systolic (congestive) heart failure: Secondary | ICD-10-CM

## 2021-02-05 LAB — CBC
HCT: 28.6 % — ABNORMAL LOW (ref 39.0–52.0)
Hemoglobin: 8.7 g/dL — ABNORMAL LOW (ref 13.0–17.0)
MCH: 23.5 pg — ABNORMAL LOW (ref 26.0–34.0)
MCHC: 30.4 g/dL (ref 30.0–36.0)
MCV: 77.1 fL — ABNORMAL LOW (ref 80.0–100.0)
Platelets: 307 10*3/uL (ref 150–400)
RBC: 3.71 MIL/uL — ABNORMAL LOW (ref 4.22–5.81)
RDW: 19.9 % — ABNORMAL HIGH (ref 11.5–15.5)
WBC: 10.5 10*3/uL (ref 4.0–10.5)
nRBC: 0 % (ref 0.0–0.2)

## 2021-02-05 LAB — BASIC METABOLIC PANEL
Anion gap: 9 (ref 5–15)
BUN: 49 mg/dL — ABNORMAL HIGH (ref 8–23)
CO2: 27 mmol/L (ref 22–32)
Calcium: 8.9 mg/dL (ref 8.9–10.3)
Chloride: 97 mmol/L — ABNORMAL LOW (ref 98–111)
Creatinine, Ser: 1.95 mg/dL — ABNORMAL HIGH (ref 0.61–1.24)
GFR, Estimated: 35 mL/min — ABNORMAL LOW (ref >60)
Glucose, Bld: 238 mg/dL — ABNORMAL HIGH (ref 70–99)
Potassium: 4.8 mmol/L (ref 3.5–5.1)
Sodium: 133 mmol/L — ABNORMAL LOW (ref 135–145)

## 2021-02-05 LAB — GLUCOSE, CAPILLARY
Glucose-Capillary: 188 mg/dL — ABNORMAL HIGH (ref 70–99)
Glucose-Capillary: 245 mg/dL — ABNORMAL HIGH (ref 70–99)

## 2021-02-05 LAB — MAGNESIUM: Magnesium: 2.4 mg/dL (ref 1.7–2.4)

## 2021-02-05 MED ORDER — SPIRIVA HANDIHALER 18 MCG IN CAPS: 18.0000 ug | ORAL_CAPSULE | Freq: Every day | RESPIRATORY_TRACT | 2 refills | Status: AC

## 2021-02-05 MED ORDER — PREDNISONE 20 MG PO TABS: 20.0000 mg | ORAL_TABLET | Freq: Every day | ORAL | 0 refills | Status: AC

## 2021-02-05 MED ORDER — NICOTINE 21 MG/24HR TD PT24: 21.0000 mg | MEDICATED_PATCH | Freq: Every day | TRANSDERMAL | 0 refills | Status: AC

## 2021-02-05 MED ORDER — LANTUS SOLOSTAR 100 UNIT/ML ~~LOC~~ SOPN: 20.0000 [IU] | PEN_INJECTOR | Freq: Every day | SUBCUTANEOUS | 11 refills | Status: DC

## 2021-02-05 MED ORDER — POLYSACCHARIDE IRON COMPLEX 150 MG PO CAPS: 150.0000 mg | ORAL_CAPSULE | Freq: Every day | ORAL | Status: DC

## 2021-02-05 NOTE — Progress Notes (Signed)
Inpatient Diabetes Program Recommendations  AACE/ADA: New Consensus Statement on Inpatient Glycemic Control   Target Ranges:  Prepandial:   less than 140 mg/dL      Peak postprandial:   less than 180 mg/dL (1-2 hours)      Critically ill patients:  140 - 180 mg/dL   Results for [name], [name] (MRN [id number]) as of 02/05/2021 09:04  Ref. Range 02/04/2021 07:50 02/04/2021 09:47 02/04/2021 11:56 02/04/2021 15:58 02/04/2021 18:46 02/04/2021 20:56 02/05/2021 07:48  Glucose-Capillary Latest Ref Range: 70 - 99 mg/dL 248 (H) 285 (H) 215 (H) 234 (H) 300 (H) 386 (H) 188 (H)   Review of Glycemic Control  Diabetes history:DM2 Outpatient Diabetes medications:Lantus 45 units QHS, Novolog 0-9 units TID with meals Current orders for Inpatient glycemic control:Lantus 15 units QHS, Novolog 3 units TID with meals, Novolog 0-15 units TID with meals; Prednisone 40 mg QAM  Inpatient Diabetes Program Recommendations:    Insulin: If steroids are continued as ordered, please consider increasing meal coverage to Novolog 6 units TID with meals if patient eats at least 50% of meals  Thanks, Barnie Alderman, RN, MSN, CDE Diabetes Coordinator Inpatient Diabetes Program [phone] (Team Pager from 8am to 5pm)

## 2021-02-05 NOTE — Plan of Care (Signed)
  Problem: Education: Goal: Knowledge of General Education information will improve Description: Including pain rating scale, medication(s)/side effects and non-pharmacologic comfort measures Outcome: Progressing   Problem: Health Behavior/Discharge Planning: Goal: Ability to manage health-related needs will improve Outcome: Progressing   Problem: Clinical Measurements: Goal: Ability to maintain clinical measurements within normal limits will improve Outcome: Progressing Goal: Will remain free from infection Outcome: Progressing Goal: Diagnostic test results will improve Outcome: Progressing Goal: Respiratory complications will improve Outcome: Progressing Goal: Cardiovascular complication will be avoided Outcome: Progressing   Problem: Pain Managment: Goal: General experience of comfort will improve Outcome: Progressing   Problem: Safety: Goal: Ability to remain free from injury will improve Outcome: Progressing   

## 2021-02-05 NOTE — Evaluation (Signed)
Physical Therapy Evaluation Patient Details Name: [name] MRN: [id number] DOB: 1946-01-31 Today's Date: 02/05/2021   History of Present Illness  Pt is a 75 y/o male w/ hx of CVA, PNA, PVD, HTN, HLD, GERD, DM, CAD, COPD, CHF s/p CABG, presented to ED w/ SOB. Admitted for acute respirtory failure with hypoxemia, acute kidney injury.    Clinical Impression  Patient alert, agreeable to PT. The patient reported at baseline he has help from his wife as needed for ADLs such as lower body dressing and for meals/meds management. Pt normally ambulatory with [facility] for household distances, denied recent falls.   The patient was able to perform bed mobility with supervision. Able to sit at EOB and on standard commode for several minutes during session with good -fair balance. Sit <> stand with RW and CGA (minA from standard commode due to low surface). He ambulated ~141ft with RW and CGA, very decreased gait velocity noted with shuffled step. Pt able to improve cadence/velocity with cues for increased step length but quickly reverts to previous gait pattern. Some fatigue noted as well as SOB but in chair with all needs in reach. SpO2 readings attempted throughout session, but unclear readings noted. The patient would benefit from further skilled PT intervention to continue to progress towards goals. Recommendation remains appropriate.      Follow Up Recommendations Home health PT;Other (comment) (cardiac rehab; follow MD's recommendations)    Equipment Recommendations  Rolling walker with 5" wheels    Recommendations for Other Services       Precautions / Restrictions Precautions Precautions: Fall Restrictions Weight Bearing Restrictions: No      Mobility  Bed Mobility Overal bed mobility: Needs Assistance Bed Mobility: Supine to Sit     Supine to sit: Supervision;HOB elevated          Transfers Overall transfer level: Needs assistance Equipment used: Rolling walker (2  wheeled) Transfers: Sit to/from Stand Sit to Stand: Min guard         General transfer comment: from recliner and EOB CGA with RW, from standard commode minA due to low surface  Ambulation/Gait   Gait Distance (Feet): 180 Feet Assistive device: Rolling walker (2 wheeled)       General Gait Details: very decreased gait, shuffled step. able to take longer steps when cued  Stairs            Wheelchair Mobility    Modified Rankin (Stroke Patients Only)       Balance Overall balance assessment: Needs assistance Sitting-balance support: Feet supported Sitting balance-Leahy Scale: Good       Standing balance-Leahy Scale: Fair                               Pertinent Vitals/Pain Pain Assessment: No/denies pain    Home Living Family/patient expects to be discharged to:: Private residence Living Arrangements: Spouse/significant other Available Help at Discharge: Family;Available PRN/intermittently Type of Home: House Home Access: Stairs to enter Entrance Stairs-Rails: Psychiatric nurse of Steps: 3-4 Home Layout: One level Home Equipment: Shower seat;Cane - single point;Wheelchair - Press photographer;Bedside commode      Prior Function Level of Independence: Needs assistance;Independent with assistive device(s)   Gait / Transfers Assistance Needed: MOD I with SPC household distances, electric scooter community distances. Endorses falls hx but nothing in the last 6 months as far as falls. Endorsed that his wife will help him with ambulation as needed  ADL's /  Homemaking Assistance Needed: wife assists with meals, med management, lower body dressing        Hand Dominance   Dominant Hand: Right    Extremity/Trunk Assessment   Upper Extremity Assessment Upper Extremity Assessment: Generalized weakness    Lower Extremity Assessment Lower Extremity Assessment: Generalized weakness    Cervical / Trunk Assessment Cervical  / Trunk Assessment: Kyphotic  Communication   Communication: HOH  Cognition Arousal/Alertness: Awake/alert Behavior During Therapy: WFL for tasks assessed/performed Overall Cognitive Status: Within Functional Limits for tasks assessed                                        General Comments      Exercises Other Exercises Other Exercises: Difficulty with spO2 readings throughout session, on room air up to 3L to ensure pt safety. Other Exercises: pt voided on commode, several minutes spent in supported sitting with supervision   Assessment/Plan    PT Assessment Patient needs continued PT services  PT Problem List Decreased strength;Decreased activity tolerance;Decreased balance;Decreased mobility;Cardiopulmonary status limiting activity       PT Treatment Interventions DME instruction;Balance training;Gait training;Neuromuscular re-education;Stair training;Functional mobility training;Patient/family education;Therapeutic activities;Therapeutic exercise    PT Goals (Current goals can be found in the Care Plan section)  Acute Rehab PT Goals Patient Stated Goal: to go home PT Goal Formulation: With patient Time For Goal Achievement: 02/19/21 Potential to Achieve Goals: Good    Frequency Min 2X/week   Barriers to discharge        Co-evaluation               AM-PAC PT "6 Clicks" Mobility  Outcome Measure Help needed turning from your back to your side while in a flat bed without using bedrails?: None Help needed moving from lying on your back to sitting on the side of a flat bed without using bedrails?: None Help needed moving to and from a bed to a chair (including a wheelchair)?: None Help needed standing up from a chair using your arms (e.g., wheelchair or bedside chair)?: None Help needed to walk in hospital room?: A Little Help needed climbing 3-5 steps with a railing? : A Little 6 Click Score: 22    End of Session Equipment Utilized During  Treatment: Gait belt;Oxygen Activity Tolerance: Patient tolerated treatment well Patient left: in chair;with call bell/phone within reach;with chair alarm set Nurse Communication: Mobility status PT Visit Diagnosis: Other abnormalities of gait and mobility (R26.89);Muscle weakness (generalized) (M62.81);Difficulty in walking, not elsewhere classified (R26.2)    Time: 4967-5916 PT Time Calculation (min) (ACUTE ONLY): 42 min   Charges:     PT Treatments $Therapeutic Exercise: 23-37 mins $Therapeutic Activity: 8-22 mins        Lieutenant Diego PT, DPT 1:15 PM,02/05/21

## 2021-02-05 NOTE — Plan of Care (Signed)
Patient IV removed.  DC papers given, explained and educated.  Set up to return to Cardiac Rehab.  Walker and o2 delivered to room prior to DC. Informed of suggested FU appt and appts made.  L/M for PCP to contact patient with appt and please give referral to see Pulmonology.  Once ready, will be wheeled to front and wife transporting home via car.

## 2021-02-05 NOTE — TOC Transition Note (Signed)
Transition of Care [facility]) - CM/SW Discharge Note   Patient Details  Name: [name] MRN: [id number] Date of Birth: 06/09/1946  Transition of [facility] CM/SW Contact:  Pete Pelt, RN Phone Number: 02/05/2021, 4:11 PM   Clinical Narrative:   TOC in to see patient.  Patient and spouse comfortable discharging with Oxygen, Johny Shock, RN, 3B nurse in to discuss oxygen with patient.  Patient and spouse comfortable with discharging with Cardiac Rehab, as patient was set up with Cardiac rehab prior to hospitalization.  Cardiac rehab states they will need release on the chart from MD prior to accepting patient back into their service.  Johny Shock, RN stated he would contact MD to determine note and request from MD.  Patient and spouse state they need a walker at home per PT.  Order for walker received, [employer] notified, walker delivered to bedside.  Patient and family have no further questions or concerns.  Will be discharged today.  TOC signing off.      Barriers to Discharge: Continued Medical Work up   Patient Goals and CMS Choice        Discharge Placement                       Discharge Plan and Services                DME Arranged: Oxygen DME Agency: [employer] Date DME Agency Contacted: 02/01/21 Time DME Agency Contacted: [phone] Representative spoke with at DME Agency: [place] ([place]) Interventions     Readmission Risk Interventions Readmission Risk Prevention Plan 02/04/2021 10/28/2020 12/27/2019  Transportation Screening Complete Complete Complete  PCP or Specialist Appt within 3-5 Days - - Complete  HRI or [place] - - Complete  Medication Review ([place]) Complete Complete Complete  PCP or Specialist appointment within 3-5 days of discharge Complete Complete -  [place] or Home Care Consult Complete Complete -  SW Recovery Care/Counseling Consult Complete - -  Palliative Care Screening Not Applicable Not  Applicable -  [place] Not Applicable Not Applicable -  Some recent data might be hidden

## 2021-02-05 NOTE — Care Management Important Message (Signed)
Important Message  Patient Details  Name: [name] MRN: [id number] Date of Birth: [birth year]   Medicare Important Message Given:  Yes     Juliann Pulse A Aileen Amore 02/05/2021, 11:21 AM

## 2021-02-05 NOTE — Discharge Summary (Signed)
Physician Discharge Summary   [name]  male DOB: [birth year]  ZYS:[id number]  PCP: [name], MD  Admit date: 01/28/2021 Discharge date: 02/05/2021  Admitted From: home Disposition:  home Home Health: Yes CODE STATUS: Full code  Discharge Instructions    Ambulatory referral to Pulmonology   Complete by: As directed    Reason for referral: Asthma/COPD   Discharge instructions   Complete by: As directed    You have been treated for COPD flare up.  Please finish taking prednisone taper at home.  You are also started on a new daily COPD inhaler Spiriva.  You are going home with 2 liters of extra oxygen.  Please follow up with your outpatient doctor to see if and when you may be able to get off of oxygen.  Your blood glucose has been intermittently low.  I have reduced your Lantus to 20u nightly (down from your previous 45u nightly).  Please follow up with your outpatient doctor to further monitor and adjust your insulin.   Dr. Enzo Bi [facility] Course:  For full details, please see H&P, progress notes, consult notes and ancillary notes.  Briefly,  [name] a 75 y.o.Caucasian malewith medical history significant formultiple medical problems including hypertension, CHF, type 2 diabetes mellitus, dyslipidemia and COPD, who presented to the emergency room with worsening dyspnea with associated cough productive of whitish sputum as well as wheezing over the last couple of days.Patient also has a severe orthopnea, paroxysmal nocturnal dyspnea. But he lost weight. He also has leg edema. Upon arriving the emergency room, he was placed on IV steroids and IV Lasix.  Most recent echocardiogram on 11/2020 showed ejection fraction 50 to 55%.  #1. Acute respiratoryfailure with hypoxemia 2/2 COPD exacerbation. --Pt has known COPD and current smoker.  Respiratory failure more likely due to COPD, not CHF (considering recent Echo normal). --Patient was requiring  BiPAP in the emergency room and was weaned down to 1-2L [place].  Also received nightly CPAP. --started on steroid, and maintained on prednisone 40 mg daily during hospitalization and discharged on steroid taper.  Pt also received scheduled DuoNeb.  Finished 4 days of azithromycin. Even though pt had recent normal Echo, trial IV lasix was given to attempt to keep pt dry and improve respiratory status.  Pt's respiratory status improved some prior to discharge, but still had significant DOE and intermittently needed 1-2L supplemental O2. Pt was also discharged on Spiriva daily and home Combivent as PRN.  Episodic severe dyspnea likely due to anxiety --episodes where pt said he couldn't catch his breath, though no O2 desat noted. --CXR showed new ground-glass opacity --repeat COVID test neg.  IV lasix 40 mg x1 did not seem to help nor preventing another episode --possibly due to anxiety or cigarette withdrawal --procal, 0.15 low --Pt received a few low-dose Xanax PRN and nicotine patch which seemed to have helped.    Current smoker Possible nicotine withdrawal and anxiety attacks --cessation advised --Pt had refused nicotine patch initially, then it was applied during rapid call --cont nicotine patch after discharge.  CHF exacerbation ruled out --CHF exacerbation was dx on admission, and IV lasix 40 mg BID started, however,  Echo in Jan 2022 showed normal left systolic and diastolic function, and normal right systolic function.  CXR on presentation unremarkable.    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 --Cr 2.11 on presentation.  Baseline around 1.8.  Cr 1.95 on the day of discharge.  Uncontrolled type 2 diabetes with hyperglycemia.  Severe hyperglycemia due to IV steroids  Intermittent hypoglycemic episodes --had episodes of hypoglycemia to 30's morning of  3/30, and 60's next morning even after Lantus reduced. Pt was discharged on reduced dose of Lantus 20u nightly (down from previous 45u nightly).  4.  Iron deficient anemia.   Borderline B12 level.  Normal folate. --s/p IV iron --cont oral iron supplement  5.  Chronic atrial fibrillation. Continue Eliquis.  Essential hypertension.   --cont amlodipine, Imdur and hydralazine.  Hypokalemia --monitor and replete PRN  Severe deconditioning --PT eval   Discharge Diagnoses:  Active Problems:   Acute respiratory failure with hypoxia (HCC)   COPD with acute exacerbation (HCC)   Hyperglycemia due to type 2 diabetes mellitus (HCC)   Acute on chronic diastolic CHF (congestive heart failure) (HCC)   Acute renal failure superimposed on stage 3b chronic kidney disease (HCC)   Atrial fibrillation, chronic ([place])   30 Day Unplanned Readmission Risk Score   Flowsheet Row ED to Hosp-Admission (Current) from 01/28/2021 in [facility] (1C)  30 Day Unplanned Readmission Risk Score (%) 38.62 Filed at 02/05/2021 0801     This score is the patient's risk of an unplanned readmission within 30 days of being discharged (0 -100%). The score is based on dignosis, age, lab data, medications, orders, and past utilization.   Low:  0-14.9   Medium: 15-21.9   High: 22-29.9   Extreme: 30 and above        Discharge Instructions:  Allergies as of 02/05/2021      Reactions   Lactose Intolerance (gi)       Medication List    STOP taking these medications   Magnesium 250 MG Tabs   potassium chloride 10 MEQ tablet Commonly known as: KLOR-CON   torsemide 20 MG tablet Commonly known as: DEMADEX   TOTAL MEMORY & FOCUS FORMULA PO     TAKE these medications   acetaminophen 325 MG tablet Commonly known as: TYLENOL Take 2 tablets (650 mg total) by mouth every 6 (six) hours as needed for mild pain (or Fever >/= 101).   albuterol 108 (90 Base) MCG/ACT inhaler Commonly  known as: VENTOLIN HFA Inhale 2 puffs into the lungs every 6 (six) hours as needed for wheezing or shortness of breath.   amLODipine 5 MG tablet Commonly known as: NORVASC Take 1 tablet (5 mg total) by mouth daily.   apixaban 5 MG Tabs tablet Commonly known as: ELIQUIS Take 1 tablet (5 mg total) by mouth 2 (two) times daily.   bifidobacterium infantis capsule Take 1 capsule by mouth every morning.   cholecalciferol 1000 units tablet Commonly known as: VITAMIN D Take 1,000 Units by mouth daily.   Cinnamon 500 MG capsule Take 1,000 mg by mouth 2 (two) times daily.   Combivent Respimat 20-100 MCG/ACT Aers respimat Generic drug: Ipratropium-Albuterol Inhale 2 puffs into the lungs 4 (four) times daily as needed.   CRANBERRY EXTRACT PO Take 500 mg by mouth every morning.   Ensure Max Protein Liqd Take 330 mLs (11 oz total) by mouth 2 (two) times daily between meals.   gabapentin 100 MG capsule Commonly known as: NEURONTIN Take 100 mg by mouth 2 (two) times daily.   glucose 4 GM chewable tablet Chew 1 tablet by mouth as needed for low blood sugar.  hydrALAZINE 50 MG tablet Commonly known as: APRESOLINE Take 1 tablet (50 mg total) by mouth every 8 (eight) hours.   insulin aspart 100 UNIT/ML injection Commonly known as: novoLOG 0-9 Units, Subcutaneous, 3 times daily with meals CBG < 70: Implement Hypoglycemia protocol/measures CBG 70 - 120: 0 units CBG 121 - 150: 1 unit CBG 151 - 200: 2 units CBG 201 - 250: 3 units CBG 251 - 300: 5 units CBG 301 - 350: 7 units CBG 351 - 400: 9 units CBG > 400: call MD What changed:   how much to take  how to take this  when to take this   iron polysaccharides 150 MG capsule Commonly known as: NIFEREX Take 1 capsule (150 mg total) by mouth daily. Can take any over-the-counter iron supplement. Start taking on: February 06, 2021   isosorbide mononitrate 30 MG 24 hr tablet Commonly known as: IMDUR Take 1 tablet (30 mg total) by  mouth daily.   lactase 3000 units tablet Commonly known as: LACTAID Take 3,000 Units by mouth 3 (three) times daily between meals as needed.   Lantus SoloStar 100 UNIT/ML Solostar Pen Generic drug: insulin glargine Inject 20 Units into the skin daily at 10 pm. This is reduced from your prior 45 units nightly. What changed:   how much to take  additional instructions   multivitamin with minerals Tabs tablet Take 1 tablet by mouth daily.   multivitamin-lutein Caps capsule Take 1 capsule by mouth daily.   nicotine 21 mg/24hr patch Commonly known as: NICODERM CQ - dosed in mg/24 hours Place 1 patch (21 mg total) onto the skin daily for 28 days. Start taking on: February 06, 2021   NON FORMULARY Take 1 Dose by mouth 3 (three) times daily. Relief Factor OTC   omeprazole 20 MG capsule Commonly known as: PRILOSEC Take 20 mg by mouth daily.   predniSONE 20 MG tablet Commonly known as: DELTASONE Take 1 tablet (20 mg total) by mouth daily with breakfast for 4 days. Steroid for your COPD flare up. Start taking on: February 06, 2021   simvastatin 20 MG tablet Commonly known as: ZOCOR Take 20 mg by mouth at bedtime.   Spiriva HandiHaler 18 MCG inhalation capsule Generic drug: tiotropium Place 1 capsule (18 mcg total) into inhaler and inhale daily.   SUPER B COMPLEX/C PO Take 1 tablet by mouth every morning.   traZODone 50 MG tablet Commonly known as: DESYREL Take 50 mg by mouth at bedtime.   vitamin C 500 MG tablet Commonly known as: ASCORBIC ACID Take 500 mg by mouth 2 (two) times daily.            Durable Medical Equipment  (From admission, onward)         Start     Ordered   02/01/21 1503  For home use only DME oxygen  Once       Question Answer Comment  Length of Need 6 Months   Mode or (Route) Nasal cannula   Liters per Minute 2   Frequency Continuous (stationary and portable oxygen unit needed)   Oxygen delivery system Gas      02/01/21 1502            Follow-up Information    [name], MD. Schedule an appointment as soon as possible for a visit in 1 week(s).   Specialty: Family Medicine Contact information: [address] [facility] [place] [address] [phone]  Ottie Glazier, MD. Schedule an appointment as soon as possible for a visit in 1 month(s).   Specialty: Pulmonary Disease Contact information: [address] [place] [address] [phone]               Allergies  Allergen Reactions  . Lactose Intolerance (Gi)      The results of significant diagnostics from this hospitalization (including imaging, microbiology, ancillary and laboratory) are listed below for reference.   Consultations:   Procedures/Studies: CT CHEST WO CONTRAST  Result Date: 02/02/2021 CLINICAL DATA:  Respiratory failure EXAM: CT CHEST WITHOUT CONTRAST TECHNIQUE: Multidetector CT imaging of the chest was performed following the standard protocol without IV contrast. COMPARISON:  Chest radiograph 02/01/2021 FINDINGS: Cardiovascular: Coronary artery calcification and aortic atherosclerotic calcification. Post CABG Mediastinum/Nodes: No axillary supraclavicular adenopathy. Enlarged mediastinal lymph nodes are present. For example 17 mm RIGHT lower paratracheal node. 16 mm subcarinal node. Probable mild hilar adenopathy on the RIGHT. Lungs/Pleura: Respiratory motion degrades the lung imaging. Centrilobular emphysema the upper lobes. There bilateral small to moderate pleural effusions greater on the RIGHT. There is bibasilar passive atelectasis. Loculated pleural fluid at the LEFT lung base. There is diffuse ground-glass densities in the RIGHT lower lobe suggesting pulmonary infection or edema. Small amount of mucoid material in the trachea at the level of the carina. Upper Abdomen: Limited view of the liver, kidneys, pancreas are unremarkable. Normal adrenal glands. Musculoskeletal:  Midline sternotomy. IMPRESSION: 1. Bilateral small to moderate pleural effusions greater on the RIGHT. 2. Bibasilar atelectasis. Round atelectasis and loculated fluid at the LEFT lung base. 3. Diffuse ground-glass densities in the RIGHT lower lobe suggest early pneumonia, asymmetric edema or pneumonitis. No focal consolidation. 4. Centrilobular emphysema the upper lobes. 5. Mild mediastinal adenopathy is favored reactive related to COPD. Electronically Signed   By: Suzy Bouchard M.D.   On: 02/02/2021 11:44   DG Chest Port 1 View  Result Date: 02/02/2021 CLINICAL DATA:  Acute respiratory failure EXAM: PORTABLE CHEST 1 VIEW COMPARISON:  Yesterday FINDINGS: Cardiomegaly. Prior CABG. Generous lung volumes. Diffuse interstitial opacity with cephalized blood flow and Kerley lines. The degree of opacity of is above prior baseline. Small bilateral pleural effusion. IMPRESSION: CHF pattern superimposed on a background of chronic lung disease. Electronically Signed   By: Monte Fantasia M.D.   On: 02/02/2021 11:15   DG Chest Port 1 View  Result Date: 02/01/2021 CLINICAL DATA:  Respiratory distress EXAM: PORTABLE CHEST 1 VIEW COMPARISON:  01/28/2021, 11/15/2020, 10/25/2020, 09/26/2019 FINDINGS: Post sternotomy changes. Diffuse bilateral interstitial opacity, some of which is felt secondary to chronic disease. There is slight increased ground-glass opacity bilaterally suspicious for superimposed acute edema or possible pneumonia. Blunting at the left costophrenic sulcus probably chronic and due to scarring. Mild cardiomegaly with aortic atherosclerosis. No pneumothorax. IMPRESSION: Suspect some degree of chronic interstitial changes. However there is increased interstitial and ground-glass opacity bilaterally suspicious for superimposed acute edema or possible pneumonia. Mild cardiomegaly. Electronically Signed   By: Donavan Foil M.D.   On: 02/01/2021 15:35   DG Chest Portable 1 View  Result Date:  01/28/2021 CLINICAL DATA:  Dyspnea EXAM: PORTABLE CHEST 1 VIEW COMPARISON:  11/15/2020 FINDINGS: The lungs are well expanded. Mild left-sided volume loss with associated left basilar pleural thickening is unchanged. Linear atelectasis within the left mid lung zone. Diffuse interstitial thickening again noted, unchanged. No superimposed focal pulmonary infiltrate. No pneumothorax or pleural effusion. Coronary artery bypass grafting has been performed. Cardiac size within normal limits. IMPRESSION: No  active disease. Stable mild left-sided volume loss with left pleural thickening. Electronically Signed   By: Fidela Salisbury MD   On: 01/28/2021 04:11      Labs: BNP (last 3 results) Recent Labs    11/16/20 0900 11/22/20 1345 01/28/21 0338  BNP 1,245.8* 796.1* 0,354.6*   Basic Metabolic Panel: Recent Labs  Lab 02/01/21 0514 02/02/21 0507 02/03/21 0452 02/04/21 0548 02/05/21 0505  NA 130* 132* 133* 134* 133*  K 4.8 4.5 4.7 4.7 4.8  CL 97* 97* 99 99 97*  CO2 27 26 27 28 27   GLUCOSE 210* 254* 327* 274* 238*  BUN 39* 42* 45* 42* 49*  CREATININE 1.85* 1.98* 1.87* 1.74* 1.95*  CALCIUM 8.6* 8.7* 8.7* 9.0 8.9  MG 2.7* 2.7* 2.6* 2.4 2.4   Liver Function Tests: No results for input(s): AST, ALT, ALKPHOS, BILITOT, PROT, ALBUMIN in the last 168 hours. No results for input(s): LIPASE, AMYLASE in the last 168 hours. No results for input(s): AMMONIA in the last 168 hours. CBC: Recent Labs  Lab 01/30/21 0409 01/31/21 0438 02/01/21 0514 02/02/21 0507 02/03/21 0452 02/04/21 0548 02/05/21 0505  WBC 15.8*   < > 8.4 9.3 9.0 12.0* 10.5  NEUTROABS 13.1*  --   --   --   --   --   --   HGB 9.7*   < > 9.3* 9.7* 8.8* 9.6* 8.7*  HCT 32.9*   < > 31.2* 31.8* 29.9* 32.9* 28.6*  MCV 76.5*   < > 75.9* 76.6* 78.1* 78.3* 77.1*  PLT 317   < > 288 299 293 308 307   < > = values in this interval not displayed.   Cardiac Enzymes: No results for input(s): CKTOTAL, CKMB, CKMBINDEX, TROPONINI in the last 168  hours. BNP: Invalid input(s): POCBNP CBG: Recent Labs  Lab 02/04/21 1156 02/04/21 1558 02/04/21 1846 02/04/21 2056 02/05/21 0748  GLUCAP 215* 234* 300* 386* 188*   D-Dimer No results for input(s): DDIMER in the last 72 hours. Hgb A1c No results for input(s): HGBA1C in the last 72 hours. Lipid Profile No results for input(s): CHOL, HDL, LDLCALC, TRIG, CHOLHDL, LDLDIRECT in the last 72 hours. Thyroid function studies No results for input(s): TSH, T4TOTAL, T3FREE, THYROIDAB in the last 72 hours.  Invalid input(s): FREET3 Anemia work up No results for input(s): VITAMINB12, FOLATE, FERRITIN, TIBC, IRON, RETICCTPCT in the last 72 hours. Urinalysis    Component Value Date/Time   COLORURINE YELLOW (A) 01/15/2016 2115   APPEARANCEUR CLEAR (A) 01/15/2016 2115   APPEARANCEUR Clear 04/28/2014 2346   LABSPEC 1.010 01/15/2016 2115   LABSPEC 1.009 04/28/2014 2346   PHURINE 5.0 01/15/2016 2115   GLUCOSEU NEGATIVE 01/15/2016 2115   GLUCOSEU Negative 04/28/2014 2346   HGBUR 1+ (A) 01/15/2016 2115   BILIRUBINUR NEGATIVE 01/15/2016 2115   BILIRUBINUR Negative 04/28/2014 2346   [place] 01/15/2016 2115   PROTEINUR 100 (A) 01/15/2016 2115   NITRITE NEGATIVE 01/15/2016 2115   LEUKOCYTESUR TRACE (A) 01/15/2016 2115   LEUKOCYTESUR 1+ 04/28/2014 2346   Sepsis Labs Invalid input(s): PROCALCITONIN,  WBC,  LACTICIDVEN Microbiology Recent Results (from the past 240 hour(s))  Resp Panel by RT-PCR (Flu A&B, Covid) Nasopharyngeal Swab     Status: None   Collection Time: 01/28/21  3:38 AM   Specimen: Nasopharyngeal Swab; Nasopharyngeal(NP) swabs in vial transport medium  Result Value Ref Range Status   SARS Coronavirus 2 by RT PCR NEGATIVE NEGATIVE Final    Comment: (NOTE) SARS-CoV-2 target nucleic acids are NOT DETECTED.  The SARS-CoV-2 RNA is generally detectable in upper respiratory specimens during the acute phase of infection. The lowest concentration of SARS-CoV-2 viral  copies this assay can detect is 138 copies/mL. A negative result does not preclude SARS-Cov-2 infection and should not be used as the sole basis for treatment or other patient management decisions. A negative result may occur with  improper specimen collection/handling, submission of specimen other than nasopharyngeal swab, presence of viral mutation(s) within the areas targeted by this assay, and inadequate number of viral copies(<138 copies/mL). A negative result must be combined with clinical observations, patient history, and epidemiological information. The expected result is Negative.  Fact Sheet for Patients:  EntrepreneurPulse.com.au  Fact Sheet for Healthcare Providers:  IncredibleEmployment.be  This test is no t yet approved or cleared by the [place] FDA and  has been authorized for detection and/or diagnosis of SARS-CoV-2 by FDA under an Emergency Use Authorization (EUA). This EUA will remain  in effect (meaning this test can be used) for the duration of the COVID-19 declaration under Section 564(b)(1) of the Act, 21 U.S.C.section 360bbb-3(b)(1), unless the authorization is terminated  or revoked sooner.       Influenza A by PCR NEGATIVE NEGATIVE Final   Influenza B by PCR NEGATIVE NEGATIVE Final    Comment: (NOTE) The Xpert Xpress SARS-CoV-2/FLU/RSV plus assay is intended as an aid in the diagnosis of influenza from Nasopharyngeal swab specimens and should not be used as a sole basis for treatment. Nasal washings and aspirates are unacceptable for Xpert Xpress SARS-CoV-2/FLU/RSV testing.  Fact Sheet for Patients: EntrepreneurPulse.com.au  Fact Sheet for Healthcare Providers: IncredibleEmployment.be  This test is not yet approved or cleared by the [place] FDA and has been authorized for detection and/or diagnosis of SARS-CoV-2 by FDA under an Emergency Use Authorization (EUA). This  EUA will remain in effect (meaning this test can be used) for the duration of the COVID-19 declaration under Section 564(b)(1) of the Act, 21 U.S.C. section 360bbb-3(b)(1), unless the authorization is terminated or revoked.  Performed at [facility], [address] [place], [place] [address]   Resp Panel by RT-PCR (Flu A&B, Covid) Nasopharyngeal Swab     Status: None   Collection Time: 02/01/21  7:31 PM   Specimen: Nasopharyngeal Swab; Nasopharyngeal(NP) swabs in vial transport medium  Result Value Ref Range Status   SARS Coronavirus 2 by RT PCR NEGATIVE NEGATIVE Final    Comment: (NOTE) SARS-CoV-2 target nucleic acids are NOT DETECTED.  The SARS-CoV-2 RNA is generally detectable in upper respiratory specimens during the acute phase of infection. The lowest concentration of SARS-CoV-2 viral copies this assay can detect is 138 copies/mL. A negative result does not preclude SARS-Cov-2 infection and should not be used as the sole basis for treatment or other patient management decisions. A negative result may occur with  improper specimen collection/handling, submission of specimen other than nasopharyngeal swab, presence of viral mutation(s) within the areas targeted by this assay, and inadequate number of viral copies(<138 copies/mL). A negative result must be combined with clinical observations, patient history, and epidemiological information. The expected result is Negative.  Fact Sheet for Patients:  EntrepreneurPulse.com.au  Fact Sheet for Healthcare Providers:  IncredibleEmployment.be  This test is no t yet approved or cleared by the [place] FDA and  has been authorized for detection and/or diagnosis of SARS-CoV-2 by FDA under an Emergency Use Authorization (EUA). This EUA will remain  in effect (meaning this test can be used) for the duration of  the COVID-19 declaration under Section 564(b)(1) of the Act,  21 U.S.C.section 360bbb-3(b)(1), unless the authorization is terminated  or revoked sooner.       Influenza A by PCR NEGATIVE NEGATIVE Final   Influenza B by PCR NEGATIVE NEGATIVE Final    Comment: (NOTE) The Xpert Xpress SARS-CoV-2/FLU/RSV plus assay is intended as an aid in the diagnosis of influenza from Nasopharyngeal swab specimens and should not be used as a sole basis for treatment. Nasal washings and aspirates are unacceptable for Xpert Xpress SARS-CoV-2/FLU/RSV testing.  Fact Sheet for Patients: EntrepreneurPulse.com.au  Fact Sheet for Healthcare Providers: IncredibleEmployment.be  This test is not yet approved or cleared by the [place] FDA and has been authorized for detection and/or diagnosis of SARS-CoV-2 by FDA under an Emergency Use Authorization (EUA). This EUA will remain in effect (meaning this test can be used) for the duration of the COVID-19 declaration under Section 564(b)(1) of the Act, 21 U.S.C. section 360bbb-3(b)(1), unless the authorization is terminated or revoked.  Performed at [facility], [address] [place], [place] [address]      Total time spend on discharging this patient, including the last patient exam, discussing the hospital stay, instructions for ongoing care as it relates to all pertinent caregivers, as well as preparing the medical discharge records, prescriptions, and/or referrals as applicable, is 50 minutes.    Enzo Bi, MD  [facility] 02/05/2021, 9:56 AM

## 2021-02-05 NOTE — Plan of Care (Signed)
Pt is cleared to return to cardiac rehab.

## 2021-02-07 DIAGNOSIS — J9600 Acute respiratory failure, unspecified whether with hypoxia or hypercapnia: Secondary | ICD-10-CM | POA: Diagnosis not present

## 2021-02-07 DIAGNOSIS — N184 Chronic kidney disease, stage 4 (severe): Secondary | ICD-10-CM | POA: Diagnosis not present

## 2021-02-07 DIAGNOSIS — N179 Acute kidney failure, unspecified: Secondary | ICD-10-CM | POA: Diagnosis not present

## 2021-02-07 DIAGNOSIS — E1142 Type 2 diabetes mellitus with diabetic polyneuropathy: Secondary | ICD-10-CM | POA: Diagnosis not present

## 2021-02-07 DIAGNOSIS — Z794 Long term (current) use of insulin: Secondary | ICD-10-CM | POA: Diagnosis not present

## 2021-02-07 DIAGNOSIS — R001 Bradycardia, unspecified: Secondary | ICD-10-CM | POA: Diagnosis not present

## 2021-02-07 DIAGNOSIS — J9621 Acute and chronic respiratory failure with hypoxia: Secondary | ICD-10-CM | POA: Diagnosis not present

## 2021-02-07 DIAGNOSIS — I5023 Acute on chronic systolic (congestive) heart failure: Secondary | ICD-10-CM | POA: Diagnosis not present

## 2021-02-07 LAB — BLOOD GAS, VENOUS
Acid-Base Excess: 1.9 mmol/L (ref 0.0–2.0)
Bicarbonate: 29.2 mmol/L — ABNORMAL HIGH (ref 20.0–28.0)
FIO2: 0.35
Mechanical Rate: 10
O2 Saturation: 45.7 %
Patient temperature: 37
pCO2, Ven: 58 mmHg (ref 44.0–60.0)
pH, Ven: 7.31 (ref 7.250–7.430)

## 2021-02-12 ENCOUNTER — Encounter: Payer: PPO | Attending: Cardiology

## 2021-02-12 ENCOUNTER — Other Ambulatory Visit: Payer: Self-pay

## 2021-02-12 DIAGNOSIS — I5022 Chronic systolic (congestive) heart failure: Secondary | ICD-10-CM | POA: Diagnosis not present

## 2021-02-12 LAB — GLUCOSE, CAPILLARY
Glucose-Capillary: 233 mg/dL — ABNORMAL HIGH (ref 70–99)
Glucose-Capillary: 207 mg/dL — ABNORMAL HIGH (ref 70–99)

## 2021-02-12 NOTE — Progress Notes (Signed)
Patient came in for rehab session and experienced difficulty transferring to exercise equipment. Talked with patient and wife. They stated he is much weaker since his recent 9 day hospital admission. Requesting outpatient/home PT for strengthening before returning to pulmonary rehab.

## 2021-02-12 NOTE — Progress Notes (Signed)
Daily Session Note  Patient Details  Name: [name] MRN: [id number] Date of Birth: [birth year] Referring Provider:   Flowsheet Row Pulmonary Rehab from 01/17/2021 in [facility] Cardiac [facility] Pul[facility] Rehab  Referring Provider Bartholome Bill MD      Encounter Date: 02/12/2021  Check In:  Session Check In - 02/12/21 1057      Check-In   Supervising physician immediately available to respond to emergencies See telemetry face sheet for immediately available ER MD    Location [facility]-Cardiac & Pulmonary Rehab    Staff Present Birdie Sons, MPA, RN;Melissa Caiola RDN, Rowe Pavy, BA, ACSM CEP, Exercise Physiologist;Joseph Tessie Fass RCP,RRT,BSRT    Virtual Visit No    Medication changes reported     Yes    Comments added spiriva, but  not taking it; added nicotine patch and prednisone for COPD exacerbation; continues potassium and furosemide at 62m    Fall or balance concerns reported    No    Tobacco Cessation No Change    Warm-up and Cool-down Performed on first and last piece of equipment    Resistance Training Performed Yes    VAD Patient? No    PAD/SET Patient? No      Pain Assessment   Currently in Pain? No/denies              Social History   Tobacco Use  Smoking Status Current Every Day Smoker  . Packs/day: 1.00  . Years: 54.00  . Pack years: 54.00  . Types: Cigarettes  Smokeless Tobacco Never Used    Goals Met:  Independence with exercise equipment Exercise tolerated well No report of cardiac concerns or symptoms Strength training completed today  Goals Unmet:  Not Applicable  Comments: Patient came in for rehab session and experienced difficulty transferring to exercise equipment, however he was able to exercise during this session. Talked with patient and wife. They stated he is much weaker since his recent 9 day hospital admission. Requesting outpatient/home PT for strengthening before returning to pulmonary rehab.    Dr. MEmily Filbertis Medical Director  for [facility].

## 2021-02-13 DIAGNOSIS — I5022 Chronic systolic (congestive) heart failure: Secondary | ICD-10-CM

## 2021-02-14 NOTE — Progress Notes (Signed)
Patient ID: [name], male    DOB: 10/29/1946, 75 y.o.   MRN: [id number]  HPI  [name] is a 75 y/o male with a history of CAD, DM, hyperlipidemia, HTN, stroke, COPD, GERD, PVD, atrial fibrillation, current tobacco use and chronic heart failure.   Echo report from 11/15/20 reviewed and showed an EF of 50-55% along with mild MR. Echo report from 12/26/19 reviewed and showed an EF of 35-40% along with mild MR and mild mildly elevated PA pressure.   Admitted 01/28/21 due to COPD exacerbation. Initially placed on bipap but able to be weaned off to nasal cannula. Given steroids and antibiotics. Given IV lasix with transition to oral diuretics. Covid test negative. Given low dose xanax due to anxiety. Discharged after 8 days. Admitted 11/15/20 due to acute shortness of breath along with cough and wheezing. Initially required bipap but then weaned off. Initially given IV lasix with transition to oral diuretics. PT consult obtained due to weakness. Discharged after 4 days. Admitted 10/23/20 due to shortness of breath. Initially needed bipap and supplemental oxygen. Cardiology consult obtained. Given IV lasix with transition to oral diuretics. Weaned off of oxygen. Discharged after 5 days.    He presents today for a follow-up visit with a chief complaint of moderate fatigue with little exertion. He describes this as chronic in nature having been present for several years although does feel like it has worsened over the last several months. He has associated shortness of breath, pedal edema and easy bruising along with this. He denies any difficulty sleeping, dizziness, abdominal distention, palpitations, chest pain or cough.   Hasn't been weighing himself daily because they have to "set up the scale". He also says that he's quite weak so isn't always the most steady on his feet to safely weigh.   Wearing his oxygen around the clock at 2L. No longer smoking and is wearing a nicotine patch.      Past Medical  History:  Diagnosis Date  . Arrhythmia    atrial fibrillation  . Atrial fibrillation ([place])   . Basal cell carcinoma 05/2019   right nasal ala, Tx: EDC  . Basal cell carcinoma 04/12/2019   Left posterior ear. Nodular pattern, excoriated.   . CHF (congestive heart failure) ([place])   . COPD (chronic obstructive pulmonary disease) ([place])   . Coronary artery disease   . Diabetes mellitus without complication ([place])   . GERD (gastroesophageal reflux disease)   . HLD (hyperlipidemia)   . Hypertension   . Peripheral vascular disease ([place])   . Pneumonia   . Prostate cancer ([place])   . Squamous cell carcinoma of skin 08/10/2018   Left upper arm above elbow. WD SCC.  Marland Kitchen Squamous cell carcinoma of skin 09/07/2018   Right forearm, below elbow. WD SCC. [facility] 01/10/2019.  Marland Kitchen Stroke [facility])    Past Surgical History:  Procedure Laterality Date  . COLONOSCOPY    . CORONARY ARTERY BYPASS GRAFT  2006  . EYE SURGERY    . GOLD SEED IMPLANT N/A 12/30/2016   Procedure: GOLD SEED IMPLANT  x3;  Surgeon: Hollice Espy, MD;  Location: [facility] ORS;  Service: Urology;  Laterality: N/A;  . HEMORRHOID SURGERY N/A 10/02/2016   Procedure: PROCTOSCOPY AND CONTROL OF RECTAL BLEEDING;  Surgeon: Excell Seltzer, MD;  Location: [facility] ORS;  Service: General;  Laterality: N/A;  . PROSTATE BIOPSY    . TONSILLECTOMY     Family History  Problem Relation Age of Onset  . Lung cancer  Mother   . Heart attack Father   . Hypertension Other   . Cancer Maternal Grandfather        prostate  . Cancer Paternal Grandmother        breast   Social History   Tobacco Use  . Smoking status: Current Every Day Smoker    Packs/day: 1.00    Years: 54.00    Pack years: 54.00    Types: Cigarettes  . Smokeless tobacco: Never Used  Substance Use Topics  . Alcohol use: No    Alcohol/week: 0.0 standard drinks   Allergies  Allergen Reactions  . Lactose Intolerance (Gi)     Past Medical History:  Diagnosis Date  . Arrhythmia    atrial  fibrillation  . Atrial fibrillation ([place])   . Basal cell carcinoma 05/2019   right nasal ala, Tx: EDC  . Basal cell carcinoma 04/12/2019   Left posterior ear. Nodular pattern, excoriated.   . CHF (congestive heart failure) ([place])   . COPD (chronic obstructive pulmonary disease) ([place])   . Coronary artery disease   . Diabetes mellitus without complication ([place])   . GERD (gastroesophageal reflux disease)   . HLD (hyperlipidemia)   . Hypertension   . Peripheral vascular disease ([place])   . Pneumonia   . Prostate cancer ([place])   . Squamous cell carcinoma of skin 08/10/2018   Left upper arm above elbow. WD SCC.  Marland Kitchen Squamous cell carcinoma of skin 09/07/2018   Right forearm, below elbow. WD SCC. [facility] 01/10/2019.  Marland Kitchen Stroke [facility])    Past Surgical History:  Procedure Laterality Date  . COLONOSCOPY    . CORONARY ARTERY BYPASS GRAFT  2006  . EYE SURGERY    . GOLD SEED IMPLANT N/A 12/30/2016   Procedure: GOLD SEED IMPLANT  x3;  Surgeon: Hollice Espy, MD;  Location: [facility] ORS;  Service: Urology;  Laterality: N/A;  . HEMORRHOID SURGERY N/A 10/02/2016   Procedure: PROCTOSCOPY AND CONTROL OF RECTAL BLEEDING;  Surgeon: Excell Seltzer, MD;  Location: [facility]S;  Service: General;  Laterality: N/A;  . PROSTATE BIOPSY    . TONSILLECTOMY     Family History  Problem Relation Age of Onset  . Lung cancer Mother   . Heart attack Father   . Hypertension Other   . Cancer Maternal Grandfather        prostate  . Cancer Paternal Grandmother        breast   Social History   Tobacco Use  . Smoking status: Current Every Day Smoker    Packs/day: 1.00    Years: 54.00    Pack years: 54.00    Types: Cigarettes  . Smokeless tobacco: Never Used  Substance Use Topics  . Alcohol use: No    Alcohol/week: 0.0 standard drinks   Allergies  Allergen Reactions  . Lactose Intolerance ([place])    Prior to Admission medications   Medication Sig Start Date End Date Taking? Authorizing Provider  acetaminophen  (TYLENOL) 325 MG tablet Take 2 tablets (650 mg total) by mouth every 6 (six) hours as needed for mild pain (or Fever >/= 101). 01/20/16  Yes Gouru, Illene Silver, MD  albuterol (PROVENTIL HFA;VENTOLIN HFA) 108 (90 Base) MCG/ACT inhaler Inhale 2 puffs into the lungs every 6 (six) hours as needed for wheezing or shortness of breath.   Yes [provider]  amLODipine (NORVASC) 5 MG tablet Take 1 tablet (5 mg total) by mouth daily. 12/30/19  Yes Ezekiel Slocumb, DO  apixaban (ELIQUIS) 5  MG TABS tablet Take 1 tablet (5 mg total) by mouth 2 (two) times daily. 03/12/20  Yes Luella Gardenhire A, FNP  bifidobacterium infantis (ALIGN) capsule Take 1 capsule by mouth every morning.   Yes [provider]  cholecalciferol (VITAMIN D) 1000 units tablet Take 1,000 Units by mouth daily.   Yes [provider]  Cinnamon 500 MG capsule Take 1,000 mg by mouth 2 (two) times daily.   Yes [provider]  COMBIVENT RESPIMAT 20-100 MCG/ACT AERS respimat Inhale 2 puffs into the lungs 4 (four) times daily as needed. 09/18/20  Yes [provider]  CRANBERRY EXTRACT PO Take 500 mg by mouth every morning.    Yes [provider]  Ensure Max Protein (ENSURE MAX PROTEIN) LIQD Take 330 mLs (11 oz total) by mouth 2 (two) times daily between meals. 12/29/19  Yes Nicole Kindred A, DO  gabapentin (NEURONTIN) 100 MG capsule Take 100 mg by mouth 2 (two) times daily.   Yes [provider]  glucose 4 GM chewable tablet Chew 1 tablet by mouth as needed for low blood sugar.   Yes [provider]  hydrALAZINE (APRESOLINE) 50 MG tablet Take 1 tablet (50 mg total) by mouth every 8 (eight) hours. 10/15/19  Yes Ghimire, Henreitta Leber, MD  insulin aspart (NOVOLOG) 100 UNIT/ML injection 0-9 Units, Subcutaneous, 3 times daily with meals CBG < 70: Implement Hypoglycemia protocol/measures CBG 70 - 120: 0 units CBG 121 - 150: 1 unit CBG 151 - 200: 2 units CBG 201 - 250: 3 units CBG 251 - 300: 5  units CBG 301 - 350: 7 units CBG 351 - 400: 9 units CBG > 400: call MD Patient taking differently: Inject into the skin 3 (three) times daily with meals. 0-9 Units, Subcutaneous, 3 times daily with meals CBG < 70: Implement Hypoglycemia protocol/measures CBG 70 - 120: 0 units CBG 121 - 150: 1 unit CBG 151 - 200: 2 units CBG 201 - 250: 3 units CBG 251 - 300: 5 units CBG 301 - 350: 7 units CBG 351 - 400: 9 units CBG > 400: call MD 10/15/19  Yes Ghimire, Henreitta Leber, MD  iron polysaccharides (NIFEREX) 150 MG capsule Take 1 capsule (150 mg total) by mouth daily. Can take any over-the-counter iron supplement. 02/06/21  Yes Enzo Bi, MD  isosorbide mononitrate (IMDUR) 30 MG 24 hr tablet Take 1 tablet (30 mg total) by mouth daily. 10/15/19  Yes Ghimire, Henreitta Leber, MD  lactase (LACTAID) 3000 units tablet Take 3,000 Units by mouth 3 (three) times daily between meals as needed.   Yes [provider]  LANTUS SOLOSTAR 100 UNIT/ML Solostar Pen Inject 20 Units into the skin daily at 10 pm. This is reduced from your prior 45 units nightly. 02/05/21  Yes Enzo Bi, MD  magnesium oxide (MAG-OX) 400 MG tablet Take 400 mg by mouth daily.   Yes [provider]  Multiple Vitamin (MULTIVITAMIN WITH MINERALS) TABS tablet Take 1 tablet by mouth daily.   Yes [provider]  multivitamin-lutein (OCUVITE-LUTEIN) CAPS capsule Take 1 capsule by mouth daily.   Yes [provider]  nicotine (NICODERM CQ - DOSED IN MG/24 HOURS) 21 mg/24hr patch Place 1 patch (21 mg total) onto the skin daily for 28 days. 02/06/21 03/06/21 Yes Enzo Bi, MD  NON FORMULARY Take 1 Dose by mouth 3 (three) times daily. Relief Factor OTC   Yes [provider]  omeprazole (PRILOSEC) 20 MG capsule Take 20 mg by mouth daily.  Yes [provider]  potassium chloride SA (KLOR-CON) 20 MEQ tablet Take 20 mEq by mouth 2 (two) times daily.   Yes [provider]  simvastatin (ZOCOR) 20 MG tablet Take  20 mg by mouth at bedtime.   Yes [provider]  SUPER B COMPLEX/C PO Take 1 tablet by mouth every morning.   Yes [provider]  tiotropium (SPIRIVA HANDIHALER) 18 MCG inhalation capsule Place 1 capsule (18 mcg total) into inhaler and inhale daily. 02/05/21 05/06/21 Yes Enzo Bi, MD  torsemide (DEMADEX) 20 MG tablet Take 40 mg by mouth daily.   Yes [provider]  traZODone (DESYREL) 50 MG tablet Take 50 mg by mouth at bedtime.   Yes [provider]  vitamin C (ASCORBIC ACID) 500 MG tablet Take 500 mg by mouth 2 (two) times daily.    Yes [provider]    Review of Systems  Constitutional: Positive for fatigue. Negative for appetite change.  HENT: Positive for congestion. Negative for postnasal drip and sore throat.   Eyes: Negative.   Respiratory: Positive for shortness of breath. Negative for cough and chest tightness.   Cardiovascular: Positive for leg swelling. Negative for chest pain and palpitations.  Gastrointestinal: Negative for abdominal distention and abdominal pain.  Endocrine: Negative.   Genitourinary: Negative.   Musculoskeletal: Negative for back pain and neck pain.  Skin: Negative.   Allergic/Immunologic: Negative.   Neurological: Negative.  Negative for dizziness and light-headedness.  Hematological: Negative for adenopathy. Bruises/bleeds easily.  Psychiatric/Behavioral: Negative for dysphoric mood and sleep disturbance (sleeping on 1 pillow). The patient is not nervous/anxious.    Vitals:   02/15/21 0953 02/15/21 1009  BP: (!) 122/102 118/60  Pulse: 81   Resp: 16   SpO2: 91%   Weight: 164 lb (74.4 kg)   Height: 5\' 6"  (1.676 m)    Wt Readings from Last 3 Encounters:  02/15/21 164 lb (74.4 kg)  01/31/21 159 lb 9.6 oz (72.4 kg)  01/17/21 160 lb 4.8 oz (72.7 kg)   Lab Results  Component Value Date   CREATININE 1.95 (H) 02/05/2021   CREATININE 1.74 (H) 02/04/2021   CREATININE 1.87 (H) 02/03/2021    Physical  Exam Vitals and nursing note reviewed. Exam conducted with a chaperone present (wife).  Constitutional:      Appearance: Normal appearance.  HENT:     Head: Normocephalic and atraumatic.  Cardiovascular:     Rate and Rhythm: Normal rate. Rhythm irregular.  Pulmonary:     Effort: Pulmonary effort is normal.     Breath sounds: No wheezing or rales.  Abdominal:     General: Abdomen is flat. There is no distension.     Palpations: Abdomen is soft.  Musculoskeletal:        General: No tenderness.     Cervical back: Normal range of motion and neck supple.     Right lower leg: Edema (2+ pitting) present.     Left lower leg: No tenderness. Edema (2+ pitting) present.  Skin:    General: Skin is warm and dry.  Neurological:     General: No focal deficit present.     Mental Status: He is alert and oriented to person, place, and time.  Psychiatric:        Mood and Affect: Mood normal.        Behavior: Behavior normal.        Thought Content: Thought content normal.    Assessment & Plan:  1: Chronic  heart failure with now preserved ejection fraction without structural changes- - NYHA class III - euvolemic today - not weighing daily as they have to set up the scale; encouraged to weigh daily if safely possible to do and call for an overnight weight gain of >2 pounds or a weekly weight gain of >5 pounds - weight up 3 pounds from last visit here 3 months ago - has not been adding salt since his recent admission and has been closely reading food labels for sodium content - reports keeping his daily fluid intake to 64 ounces/ day - saw cardiology Minette Brine) 12/19/20; returns 03/22/21 - BNP 01/28/21 was 1082.4 - currently not planning to get COVID vaccines - reports receiving his flu vaccine for this season  2: HTN- - BP initially elevated (122/102) but much improved after rechecking with manual cuff (118/60) - saw PCP Baldemar Lenis) 02/07/21; returns 03/26/21 - BMP 02/05/21 reviewed and showed sodium  133, potassium 4.8, creatinine 1.95 and GFR 35  3: DM- - A1c 01/28/21 was 6.8% - glucose at home this morning was 109  4: COPD/ Tobacco use- - no longer smoking since recent admission and is now wearing nicotine patch - congratulated him on this and complete cessation encouraged - has upcoming appointment with pulmonology Lanney Gins) 03/07/21  5: Lymphedema- - stage 2 - has worn compression socks in the past; encouraged him to resume wearing them daily with removal at bedtime - limited in his ability to exercise due to weakness and shortness of breath - already elevates his legs during the day - consider compression boots if edema persists - limited in ability to titrate diuretic therapy due to CKD; may need nephrology referral   Patient did not bring his medications nor a list. Each medication was verbally reviewed with the patient and he was encouraged to bring the bottles to every visit to confirm accuracy of list.  Return in 1 month or sooner for any questions/problems before then.

## 2021-02-15 ENCOUNTER — Encounter: Payer: Self-pay | Admitting: Family

## 2021-02-15 ENCOUNTER — Ambulatory Visit: Payer: PPO | Attending: Family | Admitting: Family

## 2021-02-15 ENCOUNTER — Other Ambulatory Visit: Payer: Self-pay

## 2021-02-15 VITALS — BP 118/60 | HR 81 | Resp 16 | Ht 66.0 in | Wt 164.0 lb

## 2021-02-15 DIAGNOSIS — Z951 Presence of aortocoronary bypass graft: Secondary | ICD-10-CM | POA: Insufficient documentation

## 2021-02-15 DIAGNOSIS — J449 Chronic obstructive pulmonary disease, unspecified: Secondary | ICD-10-CM | POA: Insufficient documentation

## 2021-02-15 DIAGNOSIS — I890 Lymphedema, not elsewhere classified: Secondary | ICD-10-CM | POA: Diagnosis not present

## 2021-02-15 DIAGNOSIS — Z794 Long term (current) use of insulin: Secondary | ICD-10-CM | POA: Diagnosis not present

## 2021-02-15 DIAGNOSIS — E785 Hyperlipidemia, unspecified: Secondary | ICD-10-CM | POA: Insufficient documentation

## 2021-02-15 DIAGNOSIS — N189 Chronic kidney disease, unspecified: Secondary | ICD-10-CM | POA: Insufficient documentation

## 2021-02-15 DIAGNOSIS — I4891 Unspecified atrial fibrillation: Secondary | ICD-10-CM | POA: Diagnosis not present

## 2021-02-15 DIAGNOSIS — E1122 Type 2 diabetes mellitus with diabetic chronic kidney disease: Secondary | ICD-10-CM | POA: Diagnosis not present

## 2021-02-15 DIAGNOSIS — E1151 Type 2 diabetes mellitus with diabetic peripheral angiopathy without gangrene: Secondary | ICD-10-CM | POA: Diagnosis not present

## 2021-02-15 DIAGNOSIS — Z79899 Other long term (current) drug therapy: Secondary | ICD-10-CM | POA: Diagnosis not present

## 2021-02-15 DIAGNOSIS — Z28310 Unvaccinated for covid-19: Secondary | ICD-10-CM | POA: Insufficient documentation

## 2021-02-15 DIAGNOSIS — Z7901 Long term (current) use of anticoagulants: Secondary | ICD-10-CM | POA: Insufficient documentation

## 2021-02-15 DIAGNOSIS — I130 Hypertensive heart and chronic kidney disease with heart failure and stage 1 through stage 4 chronic kidney disease, or unspecified chronic kidney disease: Secondary | ICD-10-CM | POA: Diagnosis not present

## 2021-02-15 DIAGNOSIS — K219 Gastro-esophageal reflux disease without esophagitis: Secondary | ICD-10-CM | POA: Diagnosis not present

## 2021-02-15 DIAGNOSIS — F17210 Nicotine dependence, cigarettes, uncomplicated: Secondary | ICD-10-CM | POA: Diagnosis not present

## 2021-02-15 DIAGNOSIS — I5032 Chronic diastolic (congestive) heart failure: Secondary | ICD-10-CM | POA: Insufficient documentation

## 2021-02-15 DIAGNOSIS — Z8249 Family history of ischemic heart disease and other diseases of the circulatory system: Secondary | ICD-10-CM | POA: Insufficient documentation

## 2021-02-15 DIAGNOSIS — Z8673 Personal history of transient ischemic attack (TIA), and cerebral infarction without residual deficits: Secondary | ICD-10-CM | POA: Diagnosis not present

## 2021-02-15 DIAGNOSIS — Z9981 Dependence on supplemental oxygen: Secondary | ICD-10-CM | POA: Diagnosis not present

## 2021-02-15 DIAGNOSIS — N1832 Chronic kidney disease, stage 3b: Secondary | ICD-10-CM

## 2021-02-15 DIAGNOSIS — Z720 Tobacco use: Secondary | ICD-10-CM

## 2021-02-15 DIAGNOSIS — I10 Essential (primary) hypertension: Secondary | ICD-10-CM

## 2021-02-15 NOTE — Patient Instructions (Addendum)
Begin weighing daily and call for an overnight weight gain of > 2 pounds or a weekly weight gain of >5 pounds.   Begin wearing compression socks daily

## 2021-02-19 ENCOUNTER — Emergency Department: Payer: PPO

## 2021-02-19 ENCOUNTER — Inpatient Hospital Stay
Admission: EM | Admit: 2021-02-19 | Discharge: 2021-03-02 | DRG: 682 | Disposition: A | Discharge status: Skilled Nursing Facility | Payer: PPO | Attending: Obstetrics and Gynecology | Admitting: Obstetrics and Gynecology

## 2021-02-19 ENCOUNTER — Other Ambulatory Visit: Payer: Self-pay

## 2021-02-19 DIAGNOSIS — I130 Hypertensive heart and chronic kidney disease with heart failure and stage 1 through stage 4 chronic kidney disease, or unspecified chronic kidney disease: Secondary | ICD-10-CM | POA: Diagnosis not present

## 2021-02-19 DIAGNOSIS — I129 Hypertensive chronic kidney disease with stage 1 through stage 4 chronic kidney disease, or unspecified chronic kidney disease: Secondary | ICD-10-CM | POA: Diagnosis not present

## 2021-02-19 DIAGNOSIS — Z9119 Patient's noncompliance with other medical treatment and regimen: Secondary | ICD-10-CM

## 2021-02-19 DIAGNOSIS — I4891 Unspecified atrial fibrillation: Secondary | ICD-10-CM | POA: Diagnosis not present

## 2021-02-19 DIAGNOSIS — E785 Hyperlipidemia, unspecified: Secondary | ICD-10-CM | POA: Diagnosis not present

## 2021-02-19 DIAGNOSIS — D500 Iron deficiency anemia secondary to blood loss (chronic): Secondary | ICD-10-CM | POA: Diagnosis not present

## 2021-02-19 DIAGNOSIS — R001 Bradycardia, unspecified: Secondary | ICD-10-CM | POA: Diagnosis not present

## 2021-02-19 DIAGNOSIS — R279 Unspecified lack of coordination: Secondary | ICD-10-CM | POA: Diagnosis not present

## 2021-02-19 DIAGNOSIS — N1832 Chronic kidney disease, stage 3b: Secondary | ICD-10-CM | POA: Diagnosis not present

## 2021-02-19 DIAGNOSIS — J811 Chronic pulmonary edema: Secondary | ICD-10-CM | POA: Diagnosis not present

## 2021-02-19 DIAGNOSIS — Z7951 Long term (current) use of inhaled steroids: Secondary | ICD-10-CM

## 2021-02-19 DIAGNOSIS — T501X5A Adverse effect of loop [high-ceiling] diuretics, initial encounter: Secondary | ICD-10-CM | POA: Diagnosis present

## 2021-02-19 DIAGNOSIS — J90 Pleural effusion, not elsewhere classified: Secondary | ICD-10-CM

## 2021-02-19 DIAGNOSIS — I959 Hypotension, unspecified: Secondary | ICD-10-CM | POA: Diagnosis present

## 2021-02-19 DIAGNOSIS — G8929 Other chronic pain: Secondary | ICD-10-CM | POA: Diagnosis present

## 2021-02-19 DIAGNOSIS — E869 Volume depletion, unspecified: Secondary | ICD-10-CM | POA: Diagnosis present

## 2021-02-19 DIAGNOSIS — I10 Essential (primary) hypertension: Secondary | ICD-10-CM | POA: Diagnosis not present

## 2021-02-19 DIAGNOSIS — N170 Acute kidney failure with tubular necrosis: Secondary | ICD-10-CM | POA: Diagnosis not present

## 2021-02-19 DIAGNOSIS — K219 Gastro-esophageal reflux disease without esophagitis: Secondary | ICD-10-CM | POA: Diagnosis present

## 2021-02-19 DIAGNOSIS — I509 Heart failure, unspecified: Secondary | ICD-10-CM | POA: Diagnosis not present

## 2021-02-19 DIAGNOSIS — E875 Hyperkalemia: Secondary | ICD-10-CM | POA: Diagnosis not present

## 2021-02-19 DIAGNOSIS — R0602 Shortness of breath: Secondary | ICD-10-CM

## 2021-02-19 DIAGNOSIS — M8448XA Pathological fracture, other site, initial encounter for fracture: Secondary | ICD-10-CM | POA: Diagnosis present

## 2021-02-19 DIAGNOSIS — E871 Hypo-osmolality and hyponatremia: Secondary | ICD-10-CM | POA: Diagnosis present

## 2021-02-19 DIAGNOSIS — L89322 Pressure ulcer of left buttock, stage 2: Secondary | ICD-10-CM | POA: Diagnosis present

## 2021-02-19 DIAGNOSIS — Z8546 Personal history of malignant neoplasm of prostate: Secondary | ICD-10-CM

## 2021-02-19 DIAGNOSIS — I480 Paroxysmal atrial fibrillation: Secondary | ICD-10-CM | POA: Diagnosis not present

## 2021-02-19 DIAGNOSIS — Z8249 Family history of ischemic heart disease and other diseases of the circulatory system: Secondary | ICD-10-CM

## 2021-02-19 DIAGNOSIS — I5033 Acute on chronic diastolic (congestive) heart failure: Secondary | ICD-10-CM | POA: Diagnosis not present

## 2021-02-19 DIAGNOSIS — E1122 Type 2 diabetes mellitus with diabetic chronic kidney disease: Secondary | ICD-10-CM | POA: Diagnosis present

## 2021-02-19 DIAGNOSIS — E559 Vitamin D deficiency, unspecified: Secondary | ICD-10-CM | POA: Diagnosis not present

## 2021-02-19 DIAGNOSIS — L89312 Pressure ulcer of right buttock, stage 2: Secondary | ICD-10-CM | POA: Diagnosis not present

## 2021-02-19 DIAGNOSIS — Z951 Presence of aortocoronary bypass graft: Secondary | ICD-10-CM

## 2021-02-19 DIAGNOSIS — R531 Weakness: Secondary | ICD-10-CM

## 2021-02-19 DIAGNOSIS — Z9181 History of falling: Secondary | ICD-10-CM

## 2021-02-19 DIAGNOSIS — N189 Chronic kidney disease, unspecified: Secondary | ICD-10-CM | POA: Diagnosis not present

## 2021-02-19 DIAGNOSIS — E1151 Type 2 diabetes mellitus with diabetic peripheral angiopathy without gangrene: Secondary | ICD-10-CM | POA: Diagnosis not present

## 2021-02-19 DIAGNOSIS — M6281 Muscle weakness (generalized): Secondary | ICD-10-CM | POA: Diagnosis not present

## 2021-02-19 DIAGNOSIS — Z85828 Personal history of other malignant neoplasm of skin: Secondary | ICD-10-CM

## 2021-02-19 DIAGNOSIS — J449 Chronic obstructive pulmonary disease, unspecified: Secondary | ICD-10-CM | POA: Diagnosis not present

## 2021-02-19 DIAGNOSIS — Z7401 Bed confinement status: Secondary | ICD-10-CM | POA: Diagnosis not present

## 2021-02-19 DIAGNOSIS — E612 Magnesium deficiency: Secondary | ICD-10-CM | POA: Diagnosis not present

## 2021-02-19 DIAGNOSIS — J9621 Acute and chronic respiratory failure with hypoxia: Secondary | ICD-10-CM | POA: Diagnosis not present

## 2021-02-19 DIAGNOSIS — R0902 Hypoxemia: Secondary | ICD-10-CM

## 2021-02-19 DIAGNOSIS — L8990 Pressure ulcer of unspecified site, unspecified stage: Secondary | ICD-10-CM | POA: Insufficient documentation

## 2021-02-19 DIAGNOSIS — E8770 Fluid overload, unspecified: Secondary | ICD-10-CM | POA: Diagnosis not present

## 2021-02-19 DIAGNOSIS — Z20822 Contact with and (suspected) exposure to covid-19: Secondary | ICD-10-CM | POA: Diagnosis not present

## 2021-02-19 DIAGNOSIS — F17210 Nicotine dependence, cigarettes, uncomplicated: Secondary | ICD-10-CM | POA: Diagnosis present

## 2021-02-19 DIAGNOSIS — Z801 Family history of malignant neoplasm of trachea, bronchus and lung: Secondary | ICD-10-CM

## 2021-02-19 DIAGNOSIS — R1311 Dysphagia, oral phase: Secondary | ICD-10-CM | POA: Diagnosis not present

## 2021-02-19 DIAGNOSIS — F17200 Nicotine dependence, unspecified, uncomplicated: Secondary | ICD-10-CM | POA: Diagnosis not present

## 2021-02-19 DIAGNOSIS — J918 Pleural effusion in other conditions classified elsewhere: Secondary | ICD-10-CM | POA: Diagnosis not present

## 2021-02-19 DIAGNOSIS — Z9981 Dependence on supplemental oxygen: Secondary | ICD-10-CM

## 2021-02-19 DIAGNOSIS — E860 Dehydration: Secondary | ICD-10-CM | POA: Diagnosis present

## 2021-02-19 DIAGNOSIS — E1165 Type 2 diabetes mellitus with hyperglycemia: Secondary | ICD-10-CM | POA: Diagnosis not present

## 2021-02-19 DIAGNOSIS — R0689 Other abnormalities of breathing: Secondary | ICD-10-CM | POA: Diagnosis not present

## 2021-02-19 DIAGNOSIS — R195 Other fecal abnormalities: Secondary | ICD-10-CM | POA: Diagnosis not present

## 2021-02-19 DIAGNOSIS — N179 Acute kidney failure, unspecified: Secondary | ICD-10-CM | POA: Diagnosis present

## 2021-02-19 DIAGNOSIS — K047 Periapical abscess without sinus: Secondary | ICD-10-CM | POA: Diagnosis present

## 2021-02-19 DIAGNOSIS — Z794 Long term (current) use of insulin: Secondary | ICD-10-CM

## 2021-02-19 DIAGNOSIS — I2510 Atherosclerotic heart disease of native coronary artery without angina pectoris: Secondary | ICD-10-CM | POA: Diagnosis present

## 2021-02-19 DIAGNOSIS — J3489 Other specified disorders of nose and nasal sinuses: Secondary | ICD-10-CM

## 2021-02-19 DIAGNOSIS — D631 Anemia in chronic kidney disease: Secondary | ICD-10-CM | POA: Diagnosis not present

## 2021-02-19 DIAGNOSIS — M2550 Pain in unspecified joint: Secondary | ICD-10-CM | POA: Diagnosis not present

## 2021-02-19 DIAGNOSIS — N2581 Secondary hyperparathyroidism of renal origin: Secondary | ICD-10-CM | POA: Diagnosis present

## 2021-02-19 DIAGNOSIS — I352 Nonrheumatic aortic (valve) stenosis with insufficiency: Secondary | ICD-10-CM | POA: Diagnosis present

## 2021-02-19 DIAGNOSIS — R2689 Other abnormalities of gait and mobility: Secondary | ICD-10-CM | POA: Diagnosis not present

## 2021-02-19 DIAGNOSIS — E568 Deficiency of other vitamins: Secondary | ICD-10-CM | POA: Diagnosis not present

## 2021-02-19 DIAGNOSIS — D638 Anemia in other chronic diseases classified elsewhere: Secondary | ICD-10-CM | POA: Diagnosis not present

## 2021-02-19 DIAGNOSIS — R41841 Cognitive communication deficit: Secondary | ICD-10-CM | POA: Diagnosis not present

## 2021-02-19 DIAGNOSIS — Z7901 Long term (current) use of anticoagulants: Secondary | ICD-10-CM

## 2021-02-19 DIAGNOSIS — I5032 Chronic diastolic (congestive) heart failure: Secondary | ICD-10-CM | POA: Diagnosis present

## 2021-02-19 DIAGNOSIS — I482 Chronic atrial fibrillation, unspecified: Secondary | ICD-10-CM | POA: Diagnosis not present

## 2021-02-19 DIAGNOSIS — E11649 Type 2 diabetes mellitus with hypoglycemia without coma: Secondary | ICD-10-CM | POA: Diagnosis not present

## 2021-02-19 DIAGNOSIS — Z9889 Other specified postprocedural states: Secondary | ICD-10-CM

## 2021-02-19 DIAGNOSIS — S199XXA Unspecified injury of neck, initial encounter: Secondary | ICD-10-CM | POA: Diagnosis not present

## 2021-02-19 DIAGNOSIS — R809 Proteinuria, unspecified: Secondary | ICD-10-CM | POA: Diagnosis not present

## 2021-02-19 DIAGNOSIS — I639 Cerebral infarction, unspecified: Secondary | ICD-10-CM | POA: Diagnosis not present

## 2021-02-19 DIAGNOSIS — R296 Repeated falls: Secondary | ICD-10-CM | POA: Diagnosis not present

## 2021-02-19 DIAGNOSIS — J9611 Chronic respiratory failure with hypoxia: Secondary | ICD-10-CM | POA: Diagnosis not present

## 2021-02-19 DIAGNOSIS — E440 Moderate protein-calorie malnutrition: Secondary | ICD-10-CM | POA: Diagnosis not present

## 2021-02-19 DIAGNOSIS — G4733 Obstructive sleep apnea (adult) (pediatric): Secondary | ICD-10-CM | POA: Diagnosis present

## 2021-02-19 DIAGNOSIS — Z79899 Other long term (current) drug therapy: Secondary | ICD-10-CM

## 2021-02-19 DIAGNOSIS — K921 Melena: Secondary | ICD-10-CM | POA: Diagnosis not present

## 2021-02-19 DIAGNOSIS — Z8673 Personal history of transient ischemic attack (TIA), and cerebral infarction without residual deficits: Secondary | ICD-10-CM

## 2021-02-19 DIAGNOSIS — I517 Cardiomegaly: Secondary | ICD-10-CM | POA: Diagnosis not present

## 2021-02-19 DIAGNOSIS — Z8701 Personal history of pneumonia (recurrent): Secondary | ICD-10-CM

## 2021-02-19 DIAGNOSIS — I5023 Acute on chronic systolic (congestive) heart failure: Secondary | ICD-10-CM | POA: Diagnosis not present

## 2021-02-19 DIAGNOSIS — D509 Iron deficiency anemia, unspecified: Secondary | ICD-10-CM | POA: Diagnosis present

## 2021-02-19 LAB — COMPREHENSIVE METABOLIC PANEL
ALT: 12 U/L (ref 0–44)
AST: 15 U/L (ref 15–41)
Albumin: 2.8 g/dL — ABNORMAL LOW (ref 3.5–5.0)
Alkaline Phosphatase: 102 U/L (ref 38–126)
Anion gap: 10 (ref 5–15)
BUN: 70 mg/dL — ABNORMAL HIGH (ref 8–23)
CO2: 20 mmol/L — ABNORMAL LOW (ref 22–32)
Calcium: 7.5 mg/dL — ABNORMAL LOW (ref 8.9–10.3)
Chloride: 103 mmol/L (ref 98–111)
Creatinine, Ser: 3.95 mg/dL — ABNORMAL HIGH (ref 0.61–1.24)
GFR, Estimated: 15 mL/min — ABNORMAL LOW (ref >60)
Glucose, Bld: 48 mg/dL — ABNORMAL LOW (ref 70–99)
Potassium: 4.6 mmol/L (ref 3.5–5.1)
Sodium: 133 mmol/L — ABNORMAL LOW (ref 135–145)
Total Bilirubin: 0.7 mg/dL (ref 0.3–1.2)
Total Protein: 5.6 g/dL — ABNORMAL LOW (ref 6.5–8.1)

## 2021-02-19 LAB — CBC WITH DIFFERENTIAL/PLATELET
Abs Immature Granulocytes: 0.03 10*3/uL (ref 0.00–0.07)
Basophils Absolute: 0 10*3/uL (ref 0.0–0.1)
Basophils Relative: 0 %
Eosinophils Absolute: 0.1 10*3/uL (ref 0.0–0.5)
Eosinophils Relative: 1 %
HCT: 26.2 % — ABNORMAL LOW (ref 39.0–52.0)
Hemoglobin: 8.1 g/dL — ABNORMAL LOW (ref 13.0–17.0)
Immature Granulocytes: 0 %
Lymphocytes Relative: 6 %
Lymphs Abs: 0.6 10*3/uL — ABNORMAL LOW (ref 0.7–4.0)
MCH: 24.1 pg — ABNORMAL LOW (ref 26.0–34.0)
MCHC: 30.9 g/dL (ref 30.0–36.0)
MCV: 78 fL — ABNORMAL LOW (ref 80.0–100.0)
Monocytes Absolute: 0.7 10*3/uL (ref 0.1–1.0)
Monocytes Relative: 7 %
Neutro Abs: 8.6 10*3/uL — ABNORMAL HIGH (ref 1.7–7.7)
Neutrophils Relative %: 86 %
Platelets: 276 10*3/uL (ref 150–400)
RBC: 3.36 MIL/uL — ABNORMAL LOW (ref 4.22–5.81)
RDW: 20.6 % — ABNORMAL HIGH (ref 11.5–15.5)
WBC: 10 10*3/uL (ref 4.0–10.5)
nRBC: 0 % (ref 0.0–0.2)

## 2021-02-19 LAB — TROPONIN I (HIGH SENSITIVITY)
Troponin I (High Sensitivity): 83 ng/L — ABNORMAL HIGH (ref <18)
Troponin I (High Sensitivity): 68 ng/L — ABNORMAL HIGH (ref <18)

## 2021-02-19 LAB — GLUCOSE, CAPILLARY: Glucose-Capillary: 123 mg/dL — ABNORMAL HIGH (ref 70–99)

## 2021-02-19 LAB — RESP PANEL BY RT-PCR (FLU A&B, COVID) ARPGX2
Influenza A by PCR: NEGATIVE
Influenza B by PCR: NEGATIVE
SARS Coronavirus 2 by RT PCR: NEGATIVE

## 2021-02-19 LAB — CBG MONITORING, ED
Glucose-Capillary: 148 mg/dL — ABNORMAL HIGH (ref 70–99)
Glucose-Capillary: 36 mg/dL — CL (ref 70–99)
Glucose-Capillary: 43 mg/dL — CL (ref 70–99)
Glucose-Capillary: 55 mg/dL — ABNORMAL LOW (ref 70–99)
Glucose-Capillary: 183 mg/dL — ABNORMAL HIGH (ref 70–99)
Glucose-Capillary: 196 mg/dL — ABNORMAL HIGH (ref 70–99)
Glucose-Capillary: 210 mg/dL — ABNORMAL HIGH (ref 70–99)
Glucose-Capillary: 99 mg/dL (ref 70–99)
Glucose-Capillary: 49 mg/dL — ABNORMAL LOW (ref 70–99)
Glucose-Capillary: 52 mg/dL — ABNORMAL LOW (ref 70–99)
Glucose-Capillary: 68 mg/dL — ABNORMAL LOW (ref 70–99)
Glucose-Capillary: 96 mg/dL (ref 70–99)

## 2021-02-19 LAB — CK: Total CK: 34 U/L — ABNORMAL LOW (ref 49–397)

## 2021-02-19 MED ORDER — MAGNESIUM OXIDE -MG SUPPLEMENT 400 (240 MG) MG PO TABS
400.0000 mg | ORAL_TABLET | Freq: Every day | ORAL | Status: DC
  Administered 2021-02-19 – 2021-03-02 (×12): 400 mg via ORAL
  Filled 2021-02-19 (×16): qty 1

## 2021-02-19 MED ORDER — DEXTROSE 50 % IV SOLN
INTRAVENOUS | Status: AC
  Filled 2021-02-19: qty 50

## 2021-02-19 MED ORDER — ONDANSETRON HCL 4 MG/2ML IJ SOLN: 4.0000 mg | Freq: Four times a day (QID) | INTRAMUSCULAR | Status: DC | PRN

## 2021-02-19 MED ORDER — ACETAMINOPHEN 325 MG PO TABS
650.0000 mg | ORAL_TABLET | Freq: Four times a day (QID) | ORAL | Status: DC | PRN
  Administered 2021-02-20 – 2021-02-28 (×3): 650 mg via ORAL
  Filled 2021-02-19 (×3): qty 2

## 2021-02-19 MED ORDER — APIXABAN 5 MG PO TABS
5.0000 mg | ORAL_TABLET | Freq: Two times a day (BID) | ORAL | Status: DC
  Administered 2021-02-19 – 2021-02-27 (×17): 5 mg via ORAL
  Filled 2021-02-19 (×18): qty 1

## 2021-02-19 MED ORDER — SODIUM CHLORIDE 0.9 % IV SOLN: INTRAVENOUS | Status: DC

## 2021-02-19 MED ORDER — PANTOPRAZOLE SODIUM 40 MG PO TBEC
40.0000 mg | DELAYED_RELEASE_TABLET | Freq: Every day | ORAL | Status: DC
  Administered 2021-02-19 – 2021-02-27 (×9): 40 mg via ORAL
  Filled 2021-02-19 (×10): qty 1

## 2021-02-19 MED ORDER — DEXTROSE 50 % IV SOLN
50.0000 mL | Freq: Once | INTRAVENOUS | Status: AC
  Administered 2021-02-19: 50 mL via INTRAVENOUS

## 2021-02-19 MED ORDER — TIOTROPIUM BROMIDE MONOHYDRATE 18 MCG IN CAPS
18.0000 ug | ORAL_CAPSULE | Freq: Every day | RESPIRATORY_TRACT | Status: DC
  Administered 2021-02-19 – 2021-03-02 (×11): 18 ug via RESPIRATORY_TRACT
  Filled 2021-02-19 (×4): qty 5

## 2021-02-19 MED ORDER — SODIUM CHLORIDE 0.9 % IV SOLN
3.0000 g | Freq: Once | INTRAVENOUS | Status: AC
  Administered 2021-02-19: 3 g via INTRAVENOUS
  Filled 2021-02-19: qty 8

## 2021-02-19 MED ORDER — RISAQUAD PO CAPS
1.0000 | ORAL_CAPSULE | Freq: Every day | ORAL | Status: DC
  Administered 2021-02-19 – 2021-03-02 (×12): 1 via ORAL
  Filled 2021-02-19 (×13): qty 1

## 2021-02-19 MED ORDER — TRAZODONE HCL 50 MG PO TABS
50.0000 mg | ORAL_TABLET | Freq: Every day | ORAL | Status: DC
  Administered 2021-02-19 – 2021-03-01 (×11): 50 mg via ORAL
  Filled 2021-02-19 (×12): qty 1

## 2021-02-19 MED ORDER — OCUVITE-LUTEIN PO CAPS
1.0000 | ORAL_CAPSULE | Freq: Every day | ORAL | Status: DC
  Administered 2021-02-19 – 2021-03-02 (×11): 1 via ORAL
  Filled 2021-02-19 (×14): qty 1

## 2021-02-19 MED ORDER — ALIGN PO CAPS: 1.0000 | ORAL_CAPSULE | Freq: Every morning | ORAL | Status: DC

## 2021-02-19 MED ORDER — SIMVASTATIN 20 MG PO TABS
20.0000 mg | ORAL_TABLET | Freq: Every morning | ORAL | Status: DC
  Administered 2021-02-19 – 2021-03-02 (×12): 20 mg via ORAL
  Filled 2021-02-19 (×6): qty 1
  Filled 2021-02-19: qty 2
  Filled 2021-02-19 (×5): qty 1

## 2021-02-19 MED ORDER — LACTATED RINGERS IV BOLUS
1000.0000 mL | Freq: Once | INTRAVENOUS | Status: AC
  Administered 2021-02-19: 1000 mL via INTRAVENOUS

## 2021-02-19 MED ORDER — INSULIN ASPART 100 UNIT/ML ~~LOC~~ SOLN
0.0000 [IU] | Freq: Three times a day (TID) | SUBCUTANEOUS | Status: DC
  Administered 2021-02-20: 5 [IU] via SUBCUTANEOUS
  Administered 2021-02-21: 2 [IU] via SUBCUTANEOUS
  Administered 2021-02-21: 5 [IU] via SUBCUTANEOUS
  Administered 2021-02-22 (×2): 3 [IU] via SUBCUTANEOUS
  Administered 2021-02-23: 2 [IU] via SUBCUTANEOUS
  Administered 2021-02-23: 5 [IU] via SUBCUTANEOUS
  Administered 2021-02-23 – 2021-02-24 (×2): 3 [IU] via SUBCUTANEOUS
  Administered 2021-02-24 (×2): 2 [IU] via SUBCUTANEOUS
  Administered 2021-02-25 – 2021-02-26 (×5): 3 [IU] via SUBCUTANEOUS
  Administered 2021-02-26: 8 [IU] via SUBCUTANEOUS
  Administered 2021-02-27: 3 [IU] via SUBCUTANEOUS
  Filled 2021-02-19 (×21): qty 1

## 2021-02-19 MED ORDER — VITAMIN D 25 MCG (1000 UNIT) PO TABS
1000.0000 [IU] | ORAL_TABLET | Freq: Every day | ORAL | Status: DC
  Administered 2021-02-19 – 2021-03-02 (×12): 1000 [IU] via ORAL
  Filled 2021-02-19 (×13): qty 1

## 2021-02-19 MED ORDER — NICOTINE 21 MG/24HR TD PT24
21.0000 mg | MEDICATED_PATCH | Freq: Every day | TRANSDERMAL | Status: DC
  Administered 2021-02-20 – 2021-03-02 (×11): 21 mg via TRANSDERMAL
  Filled 2021-02-19 (×12): qty 1

## 2021-02-19 MED ORDER — ALBUTEROL SULFATE HFA 108 (90 BASE) MCG/ACT IN AERS
2.0000 | INHALATION_SPRAY | Freq: Four times a day (QID) | RESPIRATORY_TRACT | Status: DC | PRN
  Administered 2021-02-22: 2 via RESPIRATORY_TRACT
  Filled 2021-02-19 (×2): qty 6.7

## 2021-02-19 MED ORDER — GABAPENTIN 100 MG PO CAPS
100.0000 mg | ORAL_CAPSULE | Freq: Two times a day (BID) | ORAL | Status: DC
  Administered 2021-02-19 – 2021-02-27 (×17): 100 mg via ORAL
  Filled 2021-02-19 (×18): qty 1

## 2021-02-19 MED ORDER — ONDANSETRON HCL 4 MG PO TABS: 4.0000 mg | ORAL_TABLET | Freq: Four times a day (QID) | ORAL | Status: DC | PRN

## 2021-02-19 MED ORDER — ENSURE MAX PROTEIN PO LIQD
11.0000 [oz_av] | Freq: Two times a day (BID) | ORAL | Status: DC
  Filled 2021-02-19: qty 330

## 2021-02-19 MED ORDER — IPRATROPIUM-ALBUTEROL 20-100 MCG/ACT IN AERS
2.0000 | INHALATION_SPRAY | Freq: Four times a day (QID) | RESPIRATORY_TRACT | Status: DC | PRN
  Filled 2021-02-19 (×2): qty 4

## 2021-02-19 MED ORDER — SODIUM CHLORIDE 0.9 % IV SOLN
3.0000 g | Freq: Two times a day (BID) | INTRAVENOUS | Status: AC
  Administered 2021-02-20 – 2021-02-26 (×14): 3 g via INTRAVENOUS
  Filled 2021-02-19 (×2): qty 8
  Filled 2021-02-19 (×9): qty 3
  Filled 2021-02-19: qty 8
  Filled 2021-02-19 (×3): qty 3

## 2021-02-19 MED ORDER — POLYSACCHARIDE IRON COMPLEX 150 MG PO CAPS
150.0000 mg | ORAL_CAPSULE | Freq: Every day | ORAL | Status: DC
  Administered 2021-02-19 – 2021-02-27 (×8): 150 mg via ORAL
  Filled 2021-02-19 (×11): qty 1

## 2021-02-19 MED ORDER — DEXTROSE 10 % IV SOLN: INTRAVENOUS | Status: DC

## 2021-02-19 NOTE — ED Notes (Signed)
Pt eating dinner tray  Family with pt.

## 2021-02-19 NOTE — ED Notes (Signed)
Pt wife at bedside.

## 2021-02-19 NOTE — ED Provider Notes (Signed)
[facility] Emergency Department Provider Note   ____________________________________________   Event Date/Time   First MD Initiated Contact with Patient 02/19/21 [phone]     (approximate)  I have reviewed the triage vital signs and the nursing notes.   HISTORY  Chief Complaint Weakness    HPI [name] is a 75 y.o. male with past medical history of hypertension, hyperlipidemia, diabetes, COPD on 2 L, CHF, and atrial fibrillation on Eliquis who presents to the ED complaining of weakness.  Patient reports that he has been feeling increasingly globally weak for the past 2 to 3 days.  He attempted to go to the bathroom today and fell on his way there, reports striking his right arm but denies hitting his head or losing consciousness.  He was able to get to the toilet but then was unable to get up off of the toilet.  His wife called EMS, who found him sitting on the toilet and unable to walk.  Patient denies any focal numbness or weakness, has not had any vision or speech difficulties.  He denies any fevers, cough, chest pain, shortness of breath, dysuria, or hematuria.        Past Medical History:  Diagnosis Date  . Arrhythmia    atrial fibrillation  . Atrial fibrillation ([place])   . Basal cell carcinoma 05/2019   right nasal ala, Tx: EDC  . Basal cell carcinoma 04/12/2019   Left posterior ear. Nodular pattern, excoriated.   . CHF (congestive heart failure) ([place])   . COPD (chronic obstructive pulmonary disease) ([place])   . Coronary artery disease   . Diabetes mellitus without complication ([place])   . GERD (gastroesophageal reflux disease)   . HLD (hyperlipidemia)   . Hypertension   . Peripheral vascular disease ([place])   . Pneumonia   . Prostate cancer ([place])   . Squamous cell carcinoma of skin 08/10/2018   Left upper arm above elbow. [facility] SCC.  Marland Kitchen Squamous cell carcinoma of skin 09/07/2018   Right forearm, below elbow. [facility] SCC. [facility] 01/10/2019.  Marland Kitchen Stroke [facility])      Patient Active Problem List   Diagnosis Date Noted  . AKI (acute kidney injury) ([place]) 02/19/2021  . Type 2 diabetes mellitus with hypoglycemia without coma ([place]) 02/19/2021  . Bradycardia 02/19/2021  . Weakness generalized 02/19/2021  . Dental infection 02/19/2021  . Acute respiratory failure ([place]) 01/28/2021  . Acute renal failure superimposed on stage 3b chronic kidney disease ([place]) 01/28/2021  . Atrial fibrillation, chronic ([place]) 01/28/2021  . Acute CHF (congestive heart failure) ([place]) 11/15/2020  . Chronic diastolic CHF (congestive heart failure) ([place]) 10/24/2020  . Elevated troponin 10/24/2020  . CKD (chronic kidney disease), stage IV ([place]) 10/24/2020  . Malnutrition of moderate degree 12/27/2019  . Hypertensive emergency 12/26/2019  . Hyperglycemia due to type 2 diabetes mellitus ([place]) 12/26/2019  . Chronic kidney disease (CKD) stage G3b/A3, moderately decreased glomerular filtration rate (GFR) between 30-44 mL/min/1.73 square meter and albuminuria creatinine ratio greater than 300 mg/g (HCC) 12/26/2019  . History of COVID-19 12/26/2019  . History of CVA (cerebrovascular accident) 12/26/2019  . 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 History of second degree heart block 12/26/2019  . Elevated LFTs 12/26/2019  . Acute systolic CHF (congestive heart failure) ([place]) 10/11/2019  . Malignant neoplasm of prostate ([place]) 11/24/2016  . Bright red rectal bleeding 10/02/2016  . COPD with acute exacerbation ([place]) 01/17/2016  . Chronic systolic heart failure ([place]) 12/12/2015  . Essential hypertension 12/12/2015  .  COPD (chronic obstructive pulmonary disease) with chronic bronchitis ([place]) 12/12/2015  . Diabetes ([place]) 12/12/2015  . Tobacco abuse 12/12/2015  . Acute respiratory failure with hypoxia ([place]) 12/03/2015    Past Surgical History:  Procedure Laterality Date  . COLONOSCOPY    . CORONARY ARTERY BYPASS GRAFT  2006  . EYE SURGERY    . GOLD SEED IMPLANT N/A 12/30/2016   Procedure: GOLD SEED  IMPLANT  x3;  Surgeon: Hollice Espy, MD;  Location: [facility] ORS;  Service: Urology;  Laterality: N/A;  . HEMORRHOID SURGERY N/A 10/02/2016   Procedure: PROCTOSCOPY AND CONTROL OF RECTAL BLEEDING;  Surgeon: Excell Seltzer, MD;  Location: [facility] ORS;  Service: General;  Laterality: N/A;  . PROSTATE BIOPSY    . TONSILLECTOMY      Prior to Admission medications   Medication Sig Start Date End Date Taking? Authorizing Provider  albuterol (PROVENTIL HFA;VENTOLIN HFA) 108 (90 Base) MCG/ACT inhaler Inhale 2 puffs into the lungs every 6 (six) hours as needed for wheezing or shortness of breath.   Yes [provider]  amLODipine (NORVASC) 5 MG tablet Take 1 tablet (5 mg total) by mouth daily. 12/30/19  Yes Ezekiel Slocumb, DO  apixaban (ELIQUIS) 5 MG TABS tablet Take 1 tablet (5 mg total) by mouth 2 (two) times daily. 03/12/20  Yes Darylene Price A, FNP  cholecalciferol (VITAMIN D) 1000 units tablet Take 1,000 Units by mouth daily.   Yes [provider]  Cinnamon 500 MG capsule Take 1,000 mg by mouth 2 (two) times daily.   Yes [provider]  COMBIVENT RESPIMAT 20-100 MCG/ACT AERS respimat Inhale 2 puffs into the lungs 4 (four) times daily as needed. 09/18/20  Yes [provider]  CRANBERRY EXTRACT PO Take 500 mg by mouth every morning.    Yes [provider]  gabapentin (NEURONTIN) 100 MG capsule Take 100 mg by mouth 2 (two) times daily.   Yes [provider]  hydrALAZINE (APRESOLINE) 50 MG tablet Take 1 tablet (50 mg total) by mouth every 8 (eight) hours. 10/15/19  Yes Ghimire, Henreitta Leber, MD  insulin aspart (NOVOLOG) 100 UNIT/ML injection 0-9 Units, Subcutaneous, 3 times daily with meals CBG < 70: Implement Hypoglycemia protocol/measures CBG 70 - 120: 0 units CBG 121 - 150: 1 unit CBG 151 - 200: 2 units CBG 201 - 250: 3 units CBG 251 - 300: 5 units CBG 301 - 350: 7 units CBG 351 - 400: 9 units CBG > 400: call MD Patient taking differently:  Inject into the skin 3 (three) times daily with meals. 0-9 Units, Subcutaneous, 3 times daily with meals CBG < 70: Implement Hypoglycemia protocol/measures CBG 70 - 120: 0 units CBG 121 - 150: 1 unit CBG 151 - 200: 2 units CBG 201 - 250: 3 units CBG 251 - 300: 5 units CBG 301 - 350: 7 units CBG 351 - 400: 9 units CBG > 400: call MD 10/15/19  Yes Ghimire, Henreitta Leber, MD  iron polysaccharides (NIFEREX) 150 MG capsule Take 1 capsule (150 mg total) by mouth daily. Can take any over-the-counter iron supplement. 02/06/21  Yes Enzo Bi, MD  isosorbide mononitrate (IMDUR) 30 MG 24 hr tablet Take 1 tablet (30 mg total) by mouth daily. 10/15/19  Yes Ghimire, Henreitta Leber, MD  LANTUS SOLOSTAR 100 UNIT/ML Solostar Pen Inject 20 Units into the skin daily at 10 pm. This is reduced from your prior 45 units nightly. 02/05/21  Yes Enzo Bi, MD  losartan (COZAAR) 25 MG tablet Take  25 mg by mouth daily. 02/06/21  Yes [provider]  magnesium oxide (MAG-OX) 400 MG tablet Take 400 mg by mouth daily.   Yes [provider]  Multiple Vitamin (MULTIVITAMIN WITH MINERALS) TABS tablet Take 1 tablet by mouth daily.   Yes [provider]  nicotine (NICODERM CQ - DOSED IN MG/24 HOURS) 21 mg/24hr patch Place 1 patch (21 mg total) onto the skin daily for 28 days. 02/06/21 03/06/21 Yes Enzo Bi, MD  omeprazole (PRILOSEC) 20 MG capsule Take 20 mg by mouth daily.   Yes [provider]  potassium chloride SA (KLOR-CON) 20 MEQ tablet Take 20 mEq by mouth 2 (two) times daily.   Yes [provider]  simvastatin (ZOCOR) 20 MG tablet Take 20 mg by mouth in the morning.   Yes [provider]  SUPER B COMPLEX/C PO Take 1 tablet by mouth every morning.   Yes [provider]  tiotropium (SPIRIVA HANDIHALER) 18 MCG inhalation capsule Place 1 capsule (18 mcg total) into inhaler and inhale daily. 02/05/21 05/06/21 Yes Enzo Bi, MD  torsemide (DEMADEX) 20 MG tablet Take 40 mg by mouth daily.    Yes [provider]  traZODone (DESYREL) 50 MG tablet Take 50 mg by mouth at bedtime.   Yes [provider]  vitamin C (ASCORBIC ACID) 500 MG tablet Take 500 mg by mouth 2 (two) times daily.    Yes [provider]  acetaminophen (TYLENOL) 325 MG tablet Take 2 tablets (650 mg total) by mouth every 6 (six) hours as needed for mild pain (or Fever >/= 101). 01/20/16   Gouru, Illene Silver, MD  bifidobacterium infantis (ALIGN) capsule Take 1 capsule by mouth every morning.    [provider]  Ensure Max Protein (ENSURE MAX PROTEIN) LIQD Take 330 mLs (11 oz total) by mouth 2 (two) times daily between meals. 12/29/19   Nicole Kindred A, DO  glucose 4 GM chewable tablet Chew 1 tablet by mouth as needed for low blood sugar.    [provider]  lactase (LACTAID) 3000 units tablet Take 3,000 Units by mouth 3 (three) times daily between meals as needed.    [provider]  multivitamin-lutein (OCUVITE-LUTEIN) CAPS capsule Take 1 capsule by mouth daily.    [provider]    Allergies Lactose intolerance (gi)  Family History  Problem Relation Age of Onset  . Lung cancer Mother   . Heart attack Father   . Hypertension Other   . Cancer Maternal Grandfather        prostate  . Cancer Paternal Grandmother        breast    Social History Social History   Tobacco Use  . Smoking status: Current Every Day Smoker    Packs/day: 1.00    Years: 54.00    Pack years: 54.00    Types: Cigarettes  . Smokeless tobacco: Never Used  Vaping Use  . Vaping Use: Never used  Substance Use Topics  . Alcohol use: No    Alcohol/week: 0.0 standard drinks  . Drug use: No    Review of Systems  Constitutional: No fever/chills.  Positive for generalized weakness. Eyes: No visual changes. ENT: No sore throat. Cardiovascular: Denies chest pain. Respiratory: Denies shortness of breath. Gastrointestinal: No abdominal pain.  No nausea, no vomiting.  No diarrhea.   No constipation. Genitourinary: Negative for dysuria. Musculoskeletal: Negative for back pain. Skin: Negative for rash. Neurological: Negative for headaches, focal weakness or numbness.  ____________________________________________  PHYSICAL EXAM:  VITAL SIGNS: ED Triage Vitals  Enc Vitals Group     BP 02/19/21 0841 95/61     Pulse Rate 02/19/21 0841 (!) 51     Resp 02/19/21 0841 14     Temp 02/19/21 0841 97.7 F (36.5 C)     Temp Source 02/19/21 0841 Oral     SpO2 02/19/21 0841 100 %     Weight 02/19/21 0836 163 lb 2.3 oz (74 kg)     Height 02/19/21 0836 5\' 6"  (1.676 m)     Head Circumference --      Peak Flow --      Pain Score 02/19/21 0836 4     Pain Loc --      Pain Edu? --      Excl. in [place]? --     Constitutional: Alert and oriented. Eyes: Conjunctivae are normal. Head: Atraumatic. Nose: No congestion/rhinnorhea. Mouth/Throat: Mucous membranes are moist. Neck: Normal ROM Cardiovascular: Bradycardic, irregularly irregular rhythm. Grossly normal heart sounds. Respiratory: Normal respiratory effort.  No retractions. Lungs CTAB. Gastrointestinal: Soft and nontender. No distention. Genitourinary: deferred Musculoskeletal: No lower extremity tenderness, 1+ pitting edema to knees bilaterally.  Ecchymosis and superficial abrasion to right elbow with no bony tenderness. Neurologic:  Normal speech and language. No gross focal neurologic deficits are appreciated. Skin:  Skin is warm, dry and intact. No rash noted. Psychiatric: Mood and affect are normal. Speech and behavior are normal.  ____________________________________________   LABS (all labs ordered are listed, but only abnormal results are displayed)  Labs Reviewed  CBC WITH DIFFERENTIAL/PLATELET - Abnormal; Notable for the following components:      Result Value   RBC 3.36 (*)    Hemoglobin 8.1 (*)    HCT 26.2 (*)    MCV 78.0 (*)    MCH 24.1 (*)    RDW 20.6 (*)    Neutro Abs 8.6 (*)    Lymphs Abs 0.6  (*)    All other components within normal limits  COMPREHENSIVE METABOLIC PANEL - Abnormal; Notable for the following components:   Sodium 133 (*)    CO2 20 (*)    Glucose, Bld 48 (*)    BUN 70 (*)    Creatinine, Ser 3.95 (*)    Calcium 7.5 (*)    Total Protein 5.6 (*)    Albumin 2.8 (*)    GFR, Estimated 15 (*)    All other components within normal limits  CK - Abnormal; Notable for the following components:   Total CK 34 (*)    All other components within normal limits  CBG MONITORING, ED - Abnormal; Notable for the following components:   Glucose-Capillary 36 (*)    All other components within normal limits  CBG MONITORING, ED - Abnormal; Notable for the following components:   Glucose-Capillary 55 (*)    All other components within normal limits  CBG MONITORING, ED - Abnormal; Notable for the following components:   Glucose-Capillary 43 (*)    All other components within normal limits  CBG MONITORING, ED - Abnormal; Notable for the following components:   Glucose-Capillary 148 (*)    All other components within normal limits  CBG MONITORING, ED - Abnormal; Notable for the following components:   Glucose-Capillary 183 (*)    All other components within normal limits  CBG MONITORING, ED - Abnormal; Notable for the following components:   Glucose-Capillary 196 (*)    All other components within normal limits  CBG MONITORING, ED -  Abnormal; Notable for the following components:   Glucose-Capillary 210 (*)    All other components within normal limits  TROPONIN I (HIGH SENSITIVITY) - Abnormal; Notable for the following components:   Troponin I (High Sensitivity) 83 (*)    All other components within normal limits  TROPONIN I (HIGH SENSITIVITY) - Abnormal; Notable for the following components:   Troponin I (High Sensitivity) 68 (*)    All other components within normal limits  RESP PANEL BY RT-PCR (FLU A&B, COVID) ARPGX2  URINALYSIS, COMPLETE (UACMP) WITH MICROSCOPIC    ____________________________________________  EKG  ED ECG REPORT I, Blake Divine, the attending physician, personally viewed and interpreted this ECG.   Date: 02/19/2021  EKG Time: 8:43  Rate: 50  Rhythm: atrial fibrillation, PVC  Axis: Normal  Intervals:nonspecific intraventricular conduction delay  ST&T Change: Nonspecific T wave changes   PROCEDURES  Procedure(s) performed (including Critical Care):  .Critical Care Performed by: Blake Divine, MD Authorized by: Blake Divine, MD   Critical care provider statement:    Critical care time (minutes):  45   Critical care time was exclusive of:  Separately billable procedures and treating other patients and teaching time   Critical care was necessary to treat or prevent imminent or life-threatening deterioration of the following conditions:  Renal failure   Critical care was time spent personally by me on the following activities:  Discussions with consultants, evaluation of patient's response to treatment, examination of patient, ordering and performing treatments and interventions, ordering and review of laboratory studies, ordering and review of radiographic studies, pulse oximetry, re-evaluation of patient's condition, obtaining history from patient or surrogate and review of old charts   I assumed direction of critical care for this patient from another provider in my specialty: no     Care discussed with: admitting provider       ____________________________________________   INITIAL IMPRESSION / [place] / ED COURSE       75 year old male with past medical history of hypertension, hyperlipidemia, diabetes, COPD on 2 L, and atrial fibrillation on Eliquis who presents to the ED for generalized weakness worsening over the past 2 to 3 days now with inability to walk and multiple falls.  Patient has no focal neurologic deficits on exam but does appear globally weak, low suspicion for stroke.  We will  screen for infectious process with chest x-ray and UA, also check electrolytes.  EKG shows atrial fibrillation with bradycardic rate and PVC, this could be contributing to his generalized weakness but he currently has stable blood pressure.  Patient with borderline low blood pressure, remains awake and alert but is also bradycardic in atrial fibrillation.  Case discussed with Dr. Ubaldo Glassing of cardiology, who states patient is prone to dehydration given he is known to take excess Lasix.  Patient does appear to have over diuresed himself with AKI on labs, we will continue IV fluid hydration.  He also is found to be hypoglycemic, given an amp of D50 without significant improvement.  We will give another amp of D50 as well as food for him to eat, given his dehydration plan to start on D10 drip.  CT head shows no traumatic injury but patient does have apparent masslike density in his maxilla with questionable pathologic fracture.  Patient denies facial trauma or pain, does have poor dentition with some purulence along his gumline at upper incisors.  I suspect he is developing a dental infection and we will start on Unasyn.  Case discussed with hospitalist  for admission.      ____________________________________________   FINAL CLINICAL IMPRESSION(S) / ED DIAGNOSES  Final diagnoses:  Generalized weakness  AKI (acute kidney injury) ([place])  Bradycardia  Maxillary sinus mass     ED Discharge Orders    None       Note:  This document was prepared using Dragon voice recognition software and may include unintentional dictation errors.   Blake Divine, MD 02/19/21 1453

## 2021-02-19 NOTE — ED Notes (Signed)
resumed care from ally rn.  Pt sleeping  Family with pt.  afib on monitor.  Pt on oxygen.  Iv fluids infusing.  Pt easily aroused.  Pt waiting on admission.

## 2021-02-19 NOTE — ED Notes (Signed)
Pt on call bell, stated he feels his blood sugar is getting low. This RN to check CBG at this time. Pt cbg read 49. Per orders, RN follow hypoglycemic standing orders.   Pt provided with orange juice and graham crackers with peanut butter at this time.

## 2021-02-19 NOTE — ED Notes (Signed)
Pt CBG 99, insulin not given

## 2021-02-19 NOTE — ED Triage Notes (Signed)
BIB [facility] from home due to generalized weakness for 2-3 days. States left sided weakness, unable to get off of toilet this AM. Pt with hx of stroke, right sided residual weakness. Alert and oriented X4. 2L [place] chronically. 90 CBG. SB with EMS. 22L hand.

## 2021-02-19 NOTE — ED Notes (Signed)
RLE weeping leg, changed linens. Wife at bedside.

## 2021-02-19 NOTE — Consult Note (Signed)
Pharmacy Antibiotic Note  [name] is a 75 y.o. male admitted on 02/19/2021 with a dental abscess. CT shows an expansile lucent lesion with pathologic fracture identified involving the midline the maxilla. Pharmacy has been consulted for Uansyn dosing.  Plan: Unasyn 3 g q12h Monitor clinical picture and renal function F/U C&S, abx deescalation / LOT   Height: 5\' 6"  (167.6 cm) Weight: 74 kg (163 lb 2.3 oz) IBW/kg (Calculated) : 63.8  Temp (24hrs), Avg:97.7 F (36.5 C), Min:97.7 F (36.5 C), Max:97.7 F (36.5 C)  Recent Labs  Lab 02/19/21 0849  WBC 10.0  CREATININE 3.95*    Estimated Creatinine Clearance: 14.6 mL/min (A) (by C-G formula based on SCr of 3.95 mg/dL (H)).    Allergies  Allergen Reactions  . Lactose Intolerance (Gi)     Antimicrobials this admission: 4/19 unasyn >>   Dose adjustments this admission:  Microbiology results: No cultures collected at this time  Thank you for allowing pharmacy to be a part of this patient's care.  Darnelle Bos, PharmD 02/19/2021 12:06 PM

## 2021-02-19 NOTE — ED Notes (Signed)
Pt moved to cpod.  Report off to Murriel Hopper.  Pt alert.

## 2021-02-19 NOTE — Consult Note (Signed)
Cardiology Consultation Note    Patient ID: [name], MRN: [id number], DOB/AGE: 11-30-1945 75 y.o. Admit date: 02/19/2021   Date of Consult: 02/19/2021 Primary Physician: [name], MD Primary Cardiologist: Dr. Ubaldo Glassing  Chief Complaint: edema/sob/fatigue Reason for Consultation: bradycardia/chf Requesting MD: Dr. Francine Graven  HPI: [name] is a 75 y.o. male with history of coronary artery disease s/p CABG in 2006, history of HFrEF with most recent EF estimated between 50-55%, paroxsymal atrial fibrillation, anticoagulated with Eliquis, history of aortic valve stenosis, history of a CVA, chronic kidney disease, type 2 diabetes, hyperlipidemia, hypertension, and tobacco abuse who was admitted after presenting to the emergency room with acute congestive heart failure.  He has also evidence of volume depletion.  He has had several admissions recently for the same.  He is somewhat intermittently compliant with taking the proper diuretic dose.  He often will take multiple extra doses if he feels like he is fluid overloaded.  We have had discussions regarding this.  He also eats a fairly high sodium diet.  Discussions have been made regarding this however he eats frozen meals which typically contain more than 2 g of salt every meal.  He has chronically lower extremity edema.  He has attempted to keep them elevated and has not been compliant with compression garments.  He states he has had increasing weakness and fatigue over the last several days.  Morning of admission was a unable to get off the toilet due to severe lower extremity weakness with instability when ambulating due to leg weakness.  He denies syncope.  He denies chest pain.  He denies shortness of breath nausea or vomiting.  Laboratory showed a BUN of 70 and a creatinine of 3.95.  His baseline appears to have been a BUN and high 40s with a creatinine around 1.9-2.0.  His troponin was minimally elevated at 83.  Chest x-ray showed cardiomegaly  with increased pulmonary interstitial thickening.  There was no evidence of overt congestive heart failure.  Cervical spine and head CT were unremarkable for acute abnormalities.  There was chronic small vessel ischemic disease and brain atrophy on the brain CT.  No evidence of cervical spine fracture.  EKG showed atrial fibrillation with slow ventricular response.  Review of records from office visits recently show heart rates in the 40-60 range.  His serum glucose was 48 on presentation.  He received D50 for this.  He reports compliance with his medications.  He continues to smoke.    Past Medical History:  Diagnosis Date  . Arrhythmia    atrial fibrillation  . Atrial fibrillation ([place])   . Basal cell carcinoma 05/2019   right nasal ala, Tx: EDC  . Basal cell carcinoma 04/12/2019   Left posterior ear. Nodular pattern, excoriated.   . CHF (congestive heart failure) ([place])   . COPD (chronic obstructive pulmonary disease) ([place])   . Coronary artery disease   . Diabetes mellitus without complication ([place])   . GERD (gastroesophageal reflux disease)   . HLD (hyperlipidemia)   . Hypertension   . Peripheral vascular disease ([place])   . Pneumonia   . Prostate cancer ([place])   . Squamous cell carcinoma of skin 08/10/2018   Left upper arm above elbow. [facility].  [facility] Squamous cell carcinoma of skin 09/07/2018   Right forearm, below elbow. [facility]. [facility] 01/10/2019.  [facility] Kitchen Stroke [facility])       Surgical History:  Past Surgical History:  Procedure Laterality Date  . COLONOSCOPY    .  CORONARY ARTERY BYPASS GRAFT  2006  . EYE SURGERY    . GOLD SEED IMPLANT N/A 12/30/2016   Procedure: GOLD SEED IMPLANT  x3;  Surgeon: Hollice Espy, MD;  Location: [facility] ORS;  Service: Urology;  Laterality: N/A;  . HEMORRHOID SURGERY N/A 10/02/2016   Procedure: PROCTOSCOPY AND CONTROL OF RECTAL BLEEDING;  Surgeon: Excell Seltzer, MD;  Location: [facility] ORS;  Service: General;  Laterality: N/A;  . PROSTATE BIOPSY    . TONSILLECTOMY        Home Meds: Prior to Admission medications   Medication Sig Start Date End Date Taking? Authorizing Provider  albuterol (PROVENTIL HFA;VENTOLIN HFA) 108 (90 Base) MCG/ACT inhaler Inhale 2 puffs into the lungs every 6 (six) hours as needed for wheezing or shortness of breath.   Yes [provider]  amLODipine (NORVASC) 5 MG tablet Take 1 tablet (5 mg total) by mouth daily. 12/30/19  Yes Ezekiel Slocumb, DO  apixaban (ELIQUIS) 5 MG TABS tablet Take 1 tablet (5 mg total) by mouth 2 (two) times daily. 03/12/20  Yes Darylene Price A, FNP  cholecalciferol (VITAMIN D) 1000 units tablet Take 1,000 Units by mouth daily.   Yes [provider]  Cinnamon 500 MG capsule Take 1,000 mg by mouth 2 (two) times daily.   Yes [provider]  COMBIVENT RESPIMAT 20-100 MCG/ACT AERS respimat Inhale 2 puffs into the lungs 4 (four) times daily as needed. 09/18/20  Yes [provider]  CRANBERRY EXTRACT PO Take 500 mg by mouth every morning.    Yes [provider]  gabapentin (NEURONTIN) 100 MG capsule Take 100 mg by mouth 2 (two) times daily.   Yes [provider]  hydrALAZINE (APRESOLINE) 50 MG tablet Take 1 tablet (50 mg total) by mouth every 8 (eight) hours. 10/15/19  Yes Ghimire, Henreitta Leber, MD  insulin aspart (NOVOLOG) 100 UNIT/ML injection 0-9 Units, Subcutaneous, 3 times daily with meals CBG < 70: Implement Hypoglycemia protocol/measures CBG 70 - 120: 0 units CBG 121 - 150: 1 unit CBG 151 - 200: 2 units CBG 201 - 250: 3 units CBG 251 - 300: 5 units CBG 301 - 350: 7 units CBG 351 - 400: 9 units CBG > 400: call MD Patient taking differently: Inject into the skin 3 (three) times daily with meals. 0-9 Units, Subcutaneous, 3 times daily with meals CBG < 70: Implement Hypoglycemia protocol/measures CBG 70 - 120: 0 units CBG 121 - 150: 1 unit CBG 151 - 200: 2 units CBG 201 - 250: 3 units CBG 251 - 300: 5 units CBG 301 - 350: 7 units CBG 351 - 400: 9  units CBG > 400: call MD 10/15/19  Yes Ghimire, Henreitta Leber, MD  iron polysaccharides (NIFEREX) 150 MG capsule Take 1 capsule (150 mg total) by mouth daily. Can take any over-the-counter iron supplement. 02/06/21  Yes Enzo Bi, MD  isosorbide mononitrate (IMDUR) 30 MG 24 hr tablet Take 1 tablet (30 mg total) by mouth daily. 10/15/19  Yes Ghimire, Henreitta Leber, MD  LANTUS SOLOSTAR 100 UNIT/ML Solostar Pen Inject 20 Units into the skin daily at 10 pm. This is reduced from your prior 45 units nightly. 02/05/21  Yes Enzo Bi, MD  losartan (COZAAR) 25 MG tablet Take 25 mg by mouth daily. 02/06/21  Yes [provider]  magnesium oxide (MAG-OX) 400 MG tablet Take 400 mg by mouth daily.   Yes [provider]  Multiple Vitamin (MULTIVITAMIN WITH MINERALS) TABS tablet Take 1 tablet by mouth  daily.   Yes [provider]  nicotine (NICODERM CQ - DOSED IN MG/24 HOURS) 21 mg/24hr patch Place 1 patch (21 mg total) onto the skin daily for 28 days. 02/06/21 03/06/21 Yes Enzo Bi, MD  omeprazole (PRILOSEC) 20 MG capsule Take 20 mg by mouth daily.   Yes [provider]  potassium chloride SA (KLOR-CON) 20 MEQ tablet Take 20 mEq by mouth 2 (two) times daily.   Yes [provider]  simvastatin (ZOCOR) 20 MG tablet Take 20 mg by mouth in the morning.   Yes [provider]  SUPER B COMPLEX/C PO Take 1 tablet by mouth every morning.   Yes [provider]  tiotropium (SPIRIVA HANDIHALER) 18 MCG inhalation capsule Place 1 capsule (18 mcg total) into inhaler and inhale daily. 02/05/21 05/06/21 Yes Enzo Bi, MD  torsemide (DEMADEX) 20 MG tablet Take 40 mg by mouth daily.   Yes [provider]  traZODone (DESYREL) 50 MG tablet Take 50 mg by mouth at bedtime.   Yes [provider]  vitamin C (ASCORBIC ACID) 500 MG tablet Take 500 mg by mouth 2 (two) times daily.    Yes [provider]  acetaminophen (TYLENOL) 325 MG tablet Take 2 tablets (650 mg total) by  mouth every 6 (six) hours as needed for mild pain (or Fever >/= 101). 01/20/16   Gouru, Illene Silver, MD  bifidobacterium infantis (ALIGN) capsule Take 1 capsule by mouth every morning.    [provider]  Ensure Max Protein (ENSURE MAX PROTEIN) LIQD Take 330 mLs (11 oz total) by mouth 2 (two) times daily between meals. 12/29/19   Nicole Kindred A, DO  glucose 4 GM chewable tablet Chew 1 tablet by mouth as needed for low blood sugar.    [provider]  lactase (LACTAID) 3000 units tablet Take 3,000 Units by mouth 3 (three) times daily between meals as needed.    [provider]  multivitamin-lutein (OCUVITE-LUTEIN) CAPS capsule Take 1 capsule by mouth daily.    [provider]    Inpatient Medications:  . acidophilus  1 capsule Oral Daily  . apixaban  5 mg Oral BID  . cholecalciferol  1,000 Units Oral Daily  . gabapentin  100 mg Oral BID  . insulin aspart  0-15 Units Subcutaneous TID WC  . iron polysaccharides  150 mg Oral Daily  . magnesium oxide  400 mg Oral Daily  . multivitamin-lutein  1 capsule Oral Daily  . [START ON 02/20/2021] nicotine  21 mg Transdermal Daily  . pantoprazole  40 mg Oral Daily  . Ensure Max Protein  11 oz Oral BID BM  . simvastatin  20 mg Oral q AM  . tiotropium  18 mcg Inhalation Daily  . traZODone  50 mg Oral QHS   . sodium chloride 100 mL/hr at 02/19/21 1234  . [START ON 02/20/2021] ampicillin-sulbactam (UNASYN) IV      Allergies:  Allergies  Allergen Reactions  . Lactose Intolerance (Gi)     Social History   Socioeconomic History  . Marital status: Married    Spouse name: Not on file  . Number of children: Not on file  . Years of education: Not on file  . Highest education level: Not on file  Occupational History  . Not on file  Tobacco Use  . Smoking status: Current Every Day Smoker    Packs/day: 1.00    Years: 54.00    Pack years: 54.00    Types: Cigarettes  .  Smokeless tobacco: Never Used  Vaping Use  .  Vaping Use: Never used  Substance and Sexual Activity  . Alcohol use: No    Alcohol/week: 0.0 standard drinks  . Drug use: No  . Sexual activity: Yes    Birth control/protection: None    Comment: Married  Other Topics Concern  . Not on file  Social History Narrative  . Not on file   Social Determinants of Health   Financial Resource Strain: Not on file  Food Insecurity: Not on file  Transportation Needs: Not on file  Physical Activity: Not on file  Stress: Not on file  Social Connections: Not on file  Intimate Partner Violence: Not on file     Family History  Problem Relation Age of Onset  . Lung cancer Mother   . Heart attack Father   . Hypertension Other   . Cancer Maternal Grandfather        prostate  . Cancer Paternal Grandmother        breast     Review of Systems: A 12-system review of systems was performed and is negative except as noted in the HPI.  Labs: Recent Labs    02/19/21 1056  CKTOTAL 34*   Lab Results  Component Value Date   WBC 10.0 02/19/2021   HGB 8.1 (L) 02/19/2021   HCT 26.2 (L) 02/19/2021   MCV 78.0 (L) 02/19/2021   PLT 276 02/19/2021    Recent Labs  Lab 02/19/21 0849  NA 133*  K 4.6  CL 103  CO2 20*  BUN 70*  CREATININE 3.95*  CALCIUM 7.5*  PROT 5.6*  BILITOT 0.7  ALKPHOS 102  ALT 12  AST 15  GLUCOSE 48*   No results found for: CHOL, HDL, LDLCALC, TRIG Lab Results  Component Value Date   DDIMER 0.44 10/15/2019    Radiology/Studies:  DG Chest 2 View  Result Date: 02/19/2021 CLINICAL DATA:  Weakness. EXAM: CHEST - 2 VIEW COMPARISON:  02/02/2021 FINDINGS: Midline trachea. Moderate cardiomegaly. Trace left pleural fluid or thickening. No pneumothorax. Interstitial thickening is decreased compared to the prior exam. No lobar consolidation. IMPRESSION: Cardiomegaly with decreased pulmonary interstitial thickening. Likely related to COPD/chronic bronchitis. No overt congestive failure. Similar trace left pleural fluid or  thickening. Electronically Signed   By: Abigail Miyamoto M.D.   On: 02/19/2021 09:51   CT Head Wo Contrast  Result Date: 02/19/2021 CLINICAL DATA:  Neck trauma. Generalized weakness for 2-3 days. History of stroke. EXAM: CT HEAD WITHOUT CONTRAST CT CERVICAL SPINE WITHOUT CONTRAST TECHNIQUE: Multidetector CT imaging of the head and cervical spine was performed following the standard protocol without intravenous contrast. Multiplanar CT image reconstructions of the cervical spine were also generated. COMPARISON:  MRI brain 08/08/2016 FINDINGS: CT HEAD FINDINGS Brain: No evidence of acute infarction, hemorrhage, hydrocephalus, extra-axial collection or mass lesion/mass effect. There is mild diffuse low-attenuation within the subcortical and periventricular white matter compatible with chronic microvascular disease. Remote lacunar infarct in the left cerebellar hemisphere identified. Prominence of the sulci and ventricles compatible with brain atrophy. Vascular: No hyperdense vessel or unexpected calcification. Skull: Normal. Negative for fracture or focal lesion. Sinuses/Orbits: Paranasal sinuses appear clear. Mastoid air cells are clear. Other: There is a lucent bone lesion with pathologic fracture involving the maxilla measuring approximately 2.3 x 1.2 cm, image 9/3. This is only partially visualized. CT CERVICAL SPINE FINDINGS Alignment: Normal Skull base and vertebrae: The vertebral body heights are well maintained. No fractures identified. Soft tissues and spinal  canal: No prevertebral fluid or swelling. No visible canal hematoma. Disc levels: There is partial fusion of C4-5. Marked disc space narrowing, endplate sclerosis and ventral and dorsal osteophyte formation identified at C6-7. Upper chest: Pleural thickening versus small effusion noted within the posterior right chest. Other: None IMPRESSION: 1. No acute intracranial abnormality. 2. 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 3. No evidence  for cervical spine fracture. 4. Marked degenerative disc disease identified C6-7 5. Expansile lucent lesion with pathologic fracture is identified involving the midline the maxilla. Recommend further evaluation with dedicated maxillofacial CT with contrast. Electronically Signed   By: Kerby Moors M.D.   On: 02/19/2021 09:38   CT CHEST WO CONTRAST  Result Date: 02/02/2021 CLINICAL DATA:  Respiratory failure EXAM: CT CHEST WITHOUT CONTRAST TECHNIQUE: Multidetector CT imaging of the chest was performed following the standard protocol without IV contrast. COMPARISON:  Chest radiograph 02/01/2021 FINDINGS: Cardiovascular: Coronary artery calcification and aortic atherosclerotic calcification. Post CABG Mediastinum/Nodes: No axillary supraclavicular adenopathy. Enlarged mediastinal lymph nodes are present. For example 17 mm RIGHT lower paratracheal node. 16 mm subcarinal node. Probable mild hilar adenopathy on the RIGHT. Lungs/Pleura: Respiratory motion degrades the lung imaging. Centrilobular emphysema the upper lobes. There bilateral small to moderate pleural effusions greater on the RIGHT. There is bibasilar passive atelectasis. Loculated pleural fluid at the LEFT lung base. There is diffuse ground-glass densities in the RIGHT lower lobe suggesting pulmonary infection or edema. Small amount of mucoid material in the trachea at the level of the carina. Upper Abdomen: Limited view of the liver, kidneys, pancreas are unremarkable. Normal adrenal glands. Musculoskeletal: Midline sternotomy. IMPRESSION: 1. Bilateral small to moderate pleural effusions greater on the RIGHT. 2. Bibasilar atelectasis. Round atelectasis and loculated fluid at the LEFT lung base. 3. Diffuse ground-glass densities in the RIGHT lower lobe suggest early pneumonia, asymmetric edema or pneumonitis. No focal consolidation. 4. Centrilobular emphysema the upper lobes. 5. Mild mediastinal adenopathy is favored reactive related to COPD.  Electronically Signed   By: Suzy Bouchard M.D.   On: 02/02/2021 11:44   CT Cervical Spine Wo Contrast  Result Date: 02/19/2021 CLINICAL DATA:  Neck trauma. Generalized weakness for 2-3 days. History of stroke. EXAM: CT HEAD WITHOUT CONTRAST CT CERVICAL SPINE WITHOUT CONTRAST TECHNIQUE: Multidetector CT imaging of the head and cervical spine was performed following the standard protocol without intravenous contrast. Multiplanar CT image reconstructions of the cervical spine were also generated. COMPARISON:  MRI brain 08/08/2016 FINDINGS: CT HEAD FINDINGS Brain: No evidence of acute infarction, hemorrhage, hydrocephalus, extra-axial collection or mass lesion/mass effect. There is mild diffuse low-attenuation within the subcortical and periventricular white matter compatible with chronic microvascular disease. Remote lacunar infarct in the left cerebellar hemisphere identified. Prominence of the sulci and ventricles compatible with brain atrophy. Vascular: No hyperdense vessel or unexpected calcification. Skull: Normal. Negative for fracture or focal lesion. Sinuses/Orbits: Paranasal sinuses appear clear. Mastoid air cells are clear. Other: There is a lucent bone lesion with pathologic fracture involving the maxilla measuring approximately 2.3 x 1.2 cm, image 9/3. This is only partially visualized. CT CERVICAL SPINE FINDINGS Alignment: Normal Skull base and vertebrae: The vertebral body heights are well maintained. No fractures identified. Soft tissues and spinal canal: No prevertebral fluid or swelling. No visible canal hematoma. Disc levels: There is partial fusion of C4-5. Marked disc space narrowing, endplate sclerosis and ventral and dorsal osteophyte formation identified at C6-7. Upper chest: Pleural thickening versus small effusion noted within the posterior right chest. Other: None  IMPRESSION: 1. No acute intracranial abnormality. 2. 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 3. No evidence  for cervical spine fracture. 4. Marked degenerative disc disease identified C6-7 5. Expansile lucent lesion with pathologic fracture is identified involving the midline the maxilla. Recommend further evaluation with dedicated maxillofacial CT with contrast. Electronically Signed   By: Kerby Moors M.D.   On: 02/19/2021 09:38   DG Chest Port 1 View  Result Date: 02/02/2021 CLINICAL DATA:  Acute respiratory failure EXAM: PORTABLE CHEST 1 VIEW COMPARISON:  Yesterday FINDINGS: Cardiomegaly. Prior CABG. Generous lung volumes. Diffuse interstitial opacity with cephalized blood flow and Kerley lines. The degree of opacity of is above prior baseline. Small bilateral pleural effusion. IMPRESSION: CHF pattern superimposed on a background of chronic lung disease. Electronically Signed   By: Monte Fantasia M.D.   On: 02/02/2021 11:15   DG Chest Port 1 View  Result Date: 02/01/2021 CLINICAL DATA:  Respiratory distress EXAM: PORTABLE CHEST 1 VIEW COMPARISON:  01/28/2021, 11/15/2020, 10/25/2020, 09/26/2019 FINDINGS: Post sternotomy changes. Diffuse bilateral interstitial opacity, some of which is felt secondary to chronic disease. There is slight increased ground-glass opacity bilaterally suspicious for superimposed acute edema or possible pneumonia. Blunting at the left costophrenic sulcus probably chronic and due to scarring. Mild cardiomegaly with aortic atherosclerosis. No pneumothorax. IMPRESSION: Suspect some degree of chronic interstitial changes. However there is increased interstitial and ground-glass opacity bilaterally suspicious for superimposed acute edema or possible pneumonia. Mild cardiomegaly. Electronically Signed   By: Donavan Foil M.D.   On: 02/01/2021 15:35   DG Chest Portable 1 View  Result Date: 01/28/2021 CLINICAL DATA:  Dyspnea EXAM: PORTABLE CHEST 1 VIEW COMPARISON:  11/15/2020 FINDINGS: The lungs are well expanded. Mild left-sided volume loss with associated left basilar pleural thickening  is unchanged. Linear atelectasis within the left mid lung zone. Diffuse interstitial thickening again noted, unchanged. No superimposed focal pulmonary infiltrate. No pneumothorax or pleural effusion. Coronary artery bypass grafting has been performed. Cardiac size within normal limits. IMPRESSION: No active disease. Stable mild left-sided volume loss with left pleural thickening. Electronically Signed   By: Fidela Salisbury MD   On: 01/28/2021 04:11    Wt Readings from Last 3 Encounters:  02/19/21 74 kg  02/15/21 74.4 kg  01/31/21 72.4 kg    EKG: Atrial fibrillation with slow ventricular response with ventricular ventricular rate mid 50s and interventricular conduction delay.  This is unchanged.  His baseline.  Physical Exam: Chronically and acutely ill-appearing male Blood pressure (!) 148/41, pulse (!) 45, temperature 97.7 F (36.5 C), temperature source Oral, resp. rate 11, height 5\' 6"  (1.676 m), weight 74 kg, SpO2 100 %. Body mass index is 26.33 kg/m. General: Well developed, well nourished, in no acute distress. Head: Normocephalic, atraumatic, sclera non-icteric, no xanthomas, nares are without discharge.  Neck: Negative for carotid bruits. JVD not elevated. Lungs: Clear bilaterally to auscultation without wheezes, rales, or rhonchi. Breathing is unlabored. Heart: Irregular irregular, bradycardic. Abdomen: Soft, non-tender, non-distended with normoactive bowel sounds. No hepatomegaly. No rebound/guarding. No obvious abdominal masses. Msk:  Strength and tone appear normal for age. Extremities: 4+ pitting edema to his lower extremities with weeping. Neuro: Alert and oriented X 3. No facial asymmetry. No focal deficit. Moves all extremities spontaneously. Psych:  Responds to questions appropriately with a normal affect.     75 y.o. male with history of coronary artery disease s/p CABG in 2006, history of HFrEF with most recent EF estimated between 50-55%, paroxsymal atrial  fibrillation,  anticoagulated with Eliquis, history of aortic valve stenosis, history of a CVA, chronic kidney disease, type 2 diabetes, hyperlipidemia, hypertension, and tobacco abuse who was admitted after presenting to the emergency room  1.  Acute kidney.  Injury-May be due to overdiuresis.  Appreciate nephrology input.  Will carefully hydrate.  SPEP and UPEP pending.  Renal ultrasound pending.  2.  Peripheral edema-chronic.  May need [place] boots.  Low-sodium diet.  Keep feet elevated.  3.  Chronic diastolic heart failure-no obvious pulmonary edema  4.  Atrial fibrillation-slow ventricular response.  This has been chronic.  We will avoid rate limiting medications at present.  At present.  We will continue with anticoagulation.  May need to reduce Eliquis dose should his renal function worsen any further.  GFR 17 at present.  Age less than 3 and weight greater than 60 kg.    Signed, Teodoro Spray MD 02/19/2021, 1:21 PM Pager: [phone]

## 2021-02-19 NOTE — ED Notes (Addendum)
Pt noted to have low BP, placed in trendelenburg and pressure retaken. Pt has been bradycardic since arrival as well. IVF infusing. EDP notified.

## 2021-02-19 NOTE — H&P (Addendum)
History and Physical    Vue Pavon OVF:[id number] DOB: [birth year] DOA: 02/19/2021  PCP: [name], MD   Patient coming from: Home  I have personally briefly reviewed patient's old medical records in [place]  Chief Complaint: Weakness/fall  HPI: [name] is a 75 y.o. 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 Patient states that he has had weakness for the last 3 days which has made it difficult for him to ambulate.  This morning he states he was unable to get off the toilet and actually had a fall this morning. He denies feeling dizzy or lightheaded and complains of just generalized weakness. He denies having any chest pain, no shortness of breath, no fever, no chills, no headache, no abdominal pain, no nausea, no vomiting, no diarrhea, no urinary symptoms, no blurred vision, no difficulty swallowing, no headache. Labs show sodium 133, potassium 4.6, chloride 103, bicarb 20, glucose 48, BUN 70 compared to baseline of 49, serum creatinine 3.95 compared to 1.95, calcium 7.5, alkaline phosphatase 102, albumin 2.8, AST 15, ALT 12, total protein 5.6, troponin 83, white count 10, hemoglobin 8.1, hematocrit 26.2, MCV 78, RDW 20.6, platelet count 276 Respiratory viral panel is negative Chest x-ray reviewed by me shows cardiomegaly with decreased pulmonary interstitial thickening. Likely related to COPD/chronic bronchitis. No overt congestive failure. Similar trace left pleural fluid or thickening. Cervical spine CT and head shows no acute intracranial abnormality. 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 No evidence for cervical spine fracture. Marked degenerative disc disease identified C6-7.  Expansile lucent lesion with pathologic fracture is identified involving the midline the maxilla.Recommend further evaluation with dedicated maxillofacial CT with contrast. Twelve-lead EKG reviewed by me shows atrial fibrillation with bradycardia.    ED Course: Patient is a 76 year old male who presents to the ER for evaluation of generalized weakness and fall at home. He has been bradycardic since he arrived to ER with heart rate in the 40s and low 50s as well as hypoglycemic.  Patient is awake and alert. Patient was placed on a D10 infusion due to persistent hypoglycemia in the ER. Labs show worsening of his renal function from baseline, 2 weeks ago his serum creatinine was 1.95 and today on admission it is 3.95. He will be admitted to the hospital for further evaluation and treatment.  Review of Systems: As per HPI otherwise all other systems reviewed and negative.    Past Medical History:  Diagnosis Date  . Arrhythmia    atrial fibrillation  . Atrial fibrillation ([place])   . Basal cell carcinoma 05/2019   right nasal ala, Tx: EDC  . Basal cell carcinoma 04/12/2019   Left posterior ear. Nodular pattern, excoriated.   . CHF (congestive heart failure) ([place])   . COPD (chronic obstructive pulmonary disease) ([place])   . Coronary artery disease   . Diabetes mellitus without complication ([place])   . GERD (gastroesophageal reflux disease)   . HLD (hyperlipidemia)   . Hypertension   . Peripheral vascular disease ([place])   . Pneumonia   . Prostate cancer ([place])   . Squamous cell carcinoma of skin 08/10/2018   Left upper arm above elbow. [facility].  Marland Kitchen Squamous cell carcinoma of skin 09/07/2018   Right forearm, below  elbow. [facility]. [facility] 01/10/2019.  Marland Kitchen Stroke [facility])     Past Surgical History:  Procedure Laterality Date  . COLONOSCOPY    . CORONARY ARTERY BYPASS GRAFT  2006  . EYE SURGERY    . GOLD SEED IMPLANT N/A 12/30/2016   Procedure: GOLD SEED IMPLANT  x3;  Surgeon: Hollice Espy, MD;   Location: [facility] ORS;  Service: Urology;  Laterality: N/A;  . HEMORRHOID SURGERY N/A 10/02/2016   Procedure: PROCTOSCOPY AND CONTROL OF RECTAL BLEEDING;  Surgeon: Excell Seltzer, MD;  Location: [facility] ORS;  Service: General;  Laterality: N/A;  . PROSTATE BIOPSY    . TONSILLECTOMY       reports that he has been smoking cigarettes. He has a 54.00 pack-year smoking history. He has never used smokeless tobacco. He reports that he does not drink alcohol and does not use drugs.  Allergies  Allergen Reactions  . Lactose Intolerance (Gi)     Family History  Problem Relation Age of Onset  . Lung cancer Mother   . Heart attack Father   . Hypertension Other   . Cancer Maternal Grandfather        prostate  . Cancer Paternal Grandmother        breast      Prior to Admission medications   Medication Sig Start Date End Date Taking? Authorizing Provider  albuterol (PROVENTIL HFA;VENTOLIN HFA) 108 (90 Base) MCG/ACT inhaler Inhale 2 puffs into the lungs every 6 (six) hours as needed for wheezing or shortness of breath.   Yes [provider]  amLODipine (NORVASC) 5 MG tablet Take 1 tablet (5 mg total) by mouth daily. 12/30/19  Yes Ezekiel Slocumb, DO  apixaban (ELIQUIS) 5 MG TABS tablet Take 1 tablet (5 mg total) by mouth 2 (two) times daily. 03/12/20  Yes Darylene Price A, FNP  cholecalciferol (VITAMIN D) 1000 units tablet Take 1,000 Units by mouth daily.   Yes [provider]  Cinnamon 500 MG capsule Take 1,000 mg by mouth 2 (two) times daily.   Yes [provider]  COMBIVENT RESPIMAT 20-100 MCG/ACT AERS respimat Inhale 2 puffs into the lungs 4 (four) times daily as needed. 09/18/20  Yes [provider]  CRANBERRY EXTRACT PO Take 500 mg by mouth every morning.    Yes [provider]  gabapentin (NEURONTIN) 100 MG capsule Take 100 mg by mouth 2 (two) times daily.   Yes [provider]  hydrALAZINE (APRESOLINE) 50 MG tablet Take 1 tablet (50 mg  total) by mouth every 8 (eight) hours. 10/15/19  Yes Ghimire, Henreitta Leber, MD  insulin aspart (NOVOLOG) 100 UNIT/ML injection 0-9 Units, Subcutaneous, 3 times daily with meals CBG < 70: Implement Hypoglycemia protocol/measures CBG 70 - 120: 0 units CBG 121 - 150: 1 unit CBG 151 - 200: 2 units CBG 201 - 250: 3 units CBG 251 - 300: 5 units CBG 301 - 350: 7 units CBG 351 - 400: 9 units CBG > 400: call MD Patient taking differently: Inject into the skin 3 (three) times daily with meals. 0-9 Units, Subcutaneous, 3 times daily with meals CBG < 70: Implement Hypoglycemia protocol/measures CBG 70 - 120: 0 units CBG 121 - 150: 1 unit CBG 151 - 200: 2 units CBG 201 - 250: 3 units CBG 251 - 300: 5 units CBG 301 - 350: 7 units CBG 351 - 400: 9 units CBG > 400: call MD 10/15/19  Yes Ghimire, Henreitta Leber, MD  iron polysaccharides (NIFEREX) 150 MG capsule  Take 1 capsule (150 mg total) by mouth daily. Can take any over-the-counter iron supplement. 02/06/21  Yes Enzo Bi, MD  isosorbide mononitrate (IMDUR) 30 MG 24 hr tablet Take 1 tablet (30 mg total) by mouth daily. 10/15/19  Yes Ghimire, Henreitta Leber, MD  LANTUS SOLOSTAR 100 UNIT/ML Solostar Pen Inject 20 Units into the skin daily at 10 pm. This is reduced from your prior 45 units nightly. 02/05/21  Yes Enzo Bi, MD  losartan (COZAAR) 25 MG tablet Take 25 mg by mouth daily. 02/06/21  Yes [provider]  magnesium oxide (MAG-OX) 400 MG tablet Take 400 mg by mouth daily.   Yes [provider]  Multiple Vitamin (MULTIVITAMIN WITH MINERALS) TABS tablet Take 1 tablet by mouth daily.   Yes [provider]  nicotine (NICODERM CQ - DOSED IN MG/24 HOURS) 21 mg/24hr patch Place 1 patch (21 mg total) onto the skin daily for 28 days. 02/06/21 03/06/21 Yes Enzo Bi, MD  omeprazole (PRILOSEC) 20 MG capsule Take 20 mg by mouth daily.   Yes [provider]  potassium chloride SA (KLOR-CON) 20 MEQ tablet Take 20 mEq by mouth 2 (two) times daily.    Yes [provider]  simvastatin (ZOCOR) 20 MG tablet Take 20 mg by mouth in the morning.   Yes [provider]  SUPER B COMPLEX/C PO Take 1 tablet by mouth every morning.   Yes [provider]  tiotropium (SPIRIVA HANDIHALER) 18 MCG inhalation capsule Place 1 capsule (18 mcg total) into inhaler and inhale daily. 02/05/21 05/06/21 Yes Enzo Bi, MD  torsemide (DEMADEX) 20 MG tablet Take 40 mg by mouth daily.   Yes [provider]  traZODone (DESYREL) 50 MG tablet Take 50 mg by mouth at bedtime.   Yes [provider]  vitamin C (ASCORBIC ACID) 500 MG tablet Take 500 mg by mouth 2 (two) times daily.    Yes [provider]  acetaminophen (TYLENOL) 325 MG tablet Take 2 tablets (650 mg total) by mouth every 6 (six) hours as needed for mild pain (or Fever >/= 101). 01/20/16   Gouru, Illene Silver, MD  bifidobacterium infantis (ALIGN) capsule Take 1 capsule by mouth every morning.    [provider]  Ensure Max Protein (ENSURE MAX PROTEIN) LIQD Take 330 mLs (11 oz total) by mouth 2 (two) times daily between meals. 12/29/19   Nicole Kindred A, DO  glucose 4 GM chewable tablet Chew 1 tablet by mouth as needed for low blood sugar.    [provider]  lactase (LACTAID) 3000 units tablet Take 3,000 Units by mouth 3 (three) times daily between meals as needed.    [provider]  multivitamin-lutein (OCUVITE-LUTEIN) CAPS capsule Take 1 capsule by mouth daily.    [provider]    Physical Exam: Vitals:   02/19/21 1045 02/19/21 1050 02/19/21 1055 02/19/21 1150  BP: (!) 130/43 137/72 126/86 (!) 155/49  Pulse: (!) 51 (!) 44 (!) 50 (!) 46  Resp: (!) 21 10 12 16   Temp:      TempSrc:      SpO2: 96% 99% 100% 100%  Weight:      Height:         Vitals:   02/19/21 1045 02/19/21 1050 02/19/21 1055 02/19/21 1150  BP: (!) 130/43 137/72 126/86 (!) 155/49  Pulse: (!) 51 (!) 44 (!) 50 (!) 46  Resp: (!) 21 10 12 16   Temp:       TempSrc:  SpO2: 96% 99% 100% 100%  Weight:      Height:          Constitutional: Alert and oriented x 3 . Not in any apparent distress.  Chronically ill-appearing HEENT:      Head: Normocephalic and atraumatic.         Eyes: PERLA, EOMI, Conjunctivae are normal. Sclera is non-icteric.       Mouth/Throat: Mucous membranes are moist.       Neck: Supple with no signs of meningismus. Cardiovascular:  Bradycardic. No murmurs, gallops, or rubs. 2+ symmetrical distal pulses are present . No JVD. 2+ LE edema Respiratory: Respiratory effort normal .Lungs sounds clear bilaterally. No wheezes, crackles, or rhonchi.  Gastrointestinal: Soft, non tender, and non distended with positive bowel sounds.  Genitourinary: No CVA tenderness. Musculoskeletal: Nontender with normal range of motion in all extremities. No cyanosis, or erythema of extremities.  Skin tear over right elbow Neurologic:  Face is symmetric. Moving all extremities. No gross focal neurologic deficits.  Generalized weakness Skin: Skin is warm, dry.  Skin tear over right elbow Psychiatric: Mood and affect are normal   Labs on Admission: I have personally reviewed following labs and imaging studies  CBC: Recent Labs  Lab 02/19/21 0849  WBC 10.0  NEUTROABS 8.6*  HGB 8.1*  HCT 26.2*  MCV 78.0*  PLT 253   Basic Metabolic Panel: Recent Labs  Lab 02/19/21 0849  NA 133*  K 4.6  CL 103  CO2 20*  GLUCOSE 48*  BUN 70*  CREATININE 3.95*  CALCIUM 7.5*   GFR: Estimated Creatinine Clearance: 14.6 mL/min (A) (by C-G formula based on SCr of 3.95 mg/dL (H)). Liver Function Tests: Recent Labs  Lab 02/19/21 0849  AST 15  ALT 12  ALKPHOS 102  BILITOT 0.7  PROT 5.6*  ALBUMIN 2.8*   No results for input(s): LIPASE, AMYLASE in the last 168 hours. No results for input(s): AMMONIA in the last 168 hours. Coagulation Profile: No results for input(s): INR, PROTIME in the last 168 hours. Cardiac Enzymes: Recent Labs  Lab  02/19/21 1056  CKTOTAL 34*   BNP (last 3 results) No results for input(s): PROBNP in the last 8760 hours. HbA1C: No results for input(s): HGBA1C in the last 72 hours. CBG: Recent Labs  Lab 02/19/21 1001 02/19/21 1013 02/19/21 1035 02/19/21 1044 02/19/21 1144  GLUCAP 43* 148* 183* 196* 210*   Lipid Profile: No results for input(s): CHOL, HDL, LDLCALC, TRIG, CHOLHDL, LDLDIRECT in the last 72 hours. Thyroid Function Tests: No results for input(s): TSH, T4TOTAL, FREET4, T3FREE, THYROIDAB in the last 72 hours. Anemia Panel: No results for input(s): VITAMINB12, FOLATE, FERRITIN, TIBC, IRON, RETICCTPCT in the last 72 hours. Urine analysis:    Component Value Date/Time   COLORURINE YELLOW (A) 01/15/2016 2115   APPEARANCEUR CLEAR (A) 01/15/2016 2115   APPEARANCEUR Clear 04/28/2014 2346   LABSPEC 1.010 01/15/2016 2115   LABSPEC 1.009 04/28/2014 2346   PHURINE 5.0 01/15/2016 2115   GLUCOSEU NEGATIVE 01/15/2016 2115   GLUCOSEU Negative 04/28/2014 2346   HGBUR 1+ (A) 01/15/2016 2115   BILIRUBINUR NEGATIVE 01/15/2016 2115   BILIRUBINUR Negative 04/28/2014 2346   [place] 01/15/2016 2115   PROTEINUR 100 (A) 01/15/2016 2115   NITRITE NEGATIVE 01/15/2016 2115   LEUKOCYTESUR TRACE (A) 01/15/2016 2115   LEUKOCYTESUR 1+ 04/28/2014 2346    Radiological Exams on Admission: DG Chest 2 View  Result Date: 02/19/2021 CLINICAL DATA:  Weakness. EXAM: CHEST - 2 VIEW COMPARISON:  02/02/2021  FINDINGS: Midline trachea. Moderate cardiomegaly. Trace left pleural fluid or thickening. No pneumothorax. Interstitial thickening is decreased compared to the prior exam. No lobar consolidation. IMPRESSION: Cardiomegaly with decreased pulmonary interstitial thickening. Likely related to COPD/chronic bronchitis. No overt congestive failure. Similar trace left pleural fluid or thickening. Electronically Signed   By: Abigail Miyamoto M.D.   On: 02/19/2021 09:51   CT Head Wo Contrast  Result Date:  02/19/2021 CLINICAL DATA:  Neck trauma. Generalized weakness for 2-3 days. History of stroke. EXAM: CT HEAD WITHOUT CONTRAST CT CERVICAL SPINE WITHOUT CONTRAST TECHNIQUE: Multidetector CT imaging of the head and cervical spine was performed following the standard protocol without intravenous contrast. Multiplanar CT image reconstructions of the cervical spine were also generated. COMPARISON:  MRI brain 08/08/2016 FINDINGS: CT HEAD FINDINGS Brain: No evidence of acute infarction, hemorrhage, hydrocephalus, extra-axial collection or mass lesion/mass effect. There is mild diffuse low-attenuation within the subcortical and periventricular white matter compatible with chronic microvascular disease. Remote lacunar infarct in the left cerebellar hemisphere identified. Prominence of the sulci and ventricles compatible with brain atrophy. Vascular: No hyperdense vessel or unexpected calcification. Skull: Normal. Negative for fracture or focal lesion. Sinuses/Orbits: Paranasal sinuses appear clear. Mastoid air cells are clear. Other: There is a lucent bone lesion with pathologic fracture involving the maxilla measuring approximately 2.3 x 1.2 cm, image 9/3. This is only partially visualized. CT CERVICAL SPINE FINDINGS Alignment: Normal Skull base and vertebrae: The vertebral body heights are well maintained. No fractures identified. Soft tissues and spinal canal: No prevertebral fluid or swelling. No visible canal hematoma. Disc levels: There is partial fusion of C4-5. Marked disc space narrowing, endplate sclerosis and ventral and dorsal osteophyte formation identified at C6-7. Upper chest: Pleural thickening versus small effusion noted within the posterior right chest. Other: None IMPRESSION: 1. No acute intracranial abnormality. 2. 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 3. No evidence for cervical spine fracture. 4. Marked degenerative disc disease identified C6-7 5. Expansile lucent lesion with  pathologic fracture is identified involving the midline the maxilla. Recommend further evaluation with dedicated maxillofacial CT with contrast. Electronically Signed   By: Kerby Moors M.D.   On: 02/19/2021 09:38   CT Cervical Spine Wo Contrast  Result Date: 02/19/2021 CLINICAL DATA:  Neck trauma. Generalized weakness for 2-3 days. History of stroke. EXAM: CT HEAD WITHOUT CONTRAST CT CERVICAL SPINE WITHOUT CONTRAST TECHNIQUE: Multidetector CT imaging of the head and cervical spine was performed following the standard protocol without intravenous contrast. Multiplanar CT image reconstructions of the cervical spine were also generated. COMPARISON:  MRI brain 08/08/2016 FINDINGS: CT HEAD FINDINGS Brain: No evidence of acute infarction, hemorrhage, hydrocephalus, extra-axial collection or mass lesion/mass effect. There is mild diffuse low-attenuation within the subcortical and periventricular white matter compatible with chronic microvascular disease. Remote lacunar infarct in the left cerebellar hemisphere identified. Prominence of the sulci and ventricles compatible with brain atrophy. Vascular: No hyperdense vessel or unexpected calcification. Skull: Normal. Negative for fracture or focal lesion. Sinuses/Orbits: Paranasal sinuses appear clear. Mastoid air cells are clear. Other: There is a lucent bone lesion with pathologic fracture involving the maxilla measuring approximately 2.3 x 1.2 cm, image 9/3. This is only partially visualized. CT CERVICAL SPINE FINDINGS Alignment: Normal Skull base and vertebrae: The vertebral body heights are well maintained. No fractures identified. Soft tissues and spinal canal: No prevertebral fluid or swelling. No visible canal hematoma. Disc levels: There is partial fusion of C4-5. Marked disc space narrowing, endplate sclerosis and ventral and  dorsal osteophyte formation identified at C6-7. Upper chest: Pleural thickening versus small effusion noted within the posterior right  chest. Other: None IMPRESSION: 1. No acute intracranial abnormality. 2. 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 3. No evidence for cervical spine fracture. 4. Marked degenerative disc disease identified C6-7 5. Expansile lucent lesion with pathologic fracture is identified involving the midline the maxilla. Recommend further evaluation with dedicated maxillofacial CT with contrast. Electronically Signed   By: Kerby Moors M.D.   On: 02/19/2021 09:38     Assessment/Plan Principal Problem:   AKI (acute kidney injury) ([place]) Active Problems:   Chronic kidney disease (CKD) stage G3b/A3, moderately decreased glomerular filtration rate (GFR) between 30-44 mL/min/1.73 square meter and albuminuria creatinine ratio greater than 300 mg/g (HCC)   History of CVA (cerebrovascular accident)   Chronic diastolic CHF (congestive heart failure) (HCC)   Atrial fibrillation, chronic (HCC)   Type 2 diabetes mellitus with hypoglycemia without coma (HCC)   Bradycardia   Weakness generalized   Dental infection     Acute kidney injury Most likely secondary to overdiuresis At baseline patient has a serum creatinine of 1.95 on admission and today on admission his creatinine is 3.95 We will hydrate patient Hold torsemide and losartan Hold antihypertensive medications since patient is normotensive Obtain CK levels   Atrial fibrillation with bradycardia Unclear etiology Patient is not on a beta-blocker or calcium channel blocker Obtain TSH Consult cardiology Monitor heart rate closely Continue apixaban as secondary prophylaxis for an acute stroke    Diabetes mellitus with complications of stage III chronic kidney disease and hypoglycemia Hold insulin Patient is currently on a D10 infusion Blood sugar checks every 4 hours Encourage oral intake    COPD with chronic respiratory failure Not acutely exacerbated Continue Spiriva, as needed bronchodilator therapy and inhaled  steroids    Chronic diastolic dysfunction CHF Not acutely exacerbated Hold torsemide and Cozaar due to acute kidney injury Hold amlodipine and hydralazine since patient is normotensive    Coronary artery disease Stable and not acutely exacerbated Continue statins and aspirin Hold nitrates    Generalized weakness/fall  Multifactorial and related to bradycardia as well as hypotension Place patient on fall precautions We will request PT evaluation once his acute illness improves/resolves    Dental infection Patient noted to have purulence around his gum line Cervical spine CT shows an expansile lucent lesion with pathologic fracture  identified involving the midline the maxilla. Recommend further evaluation with dedicated maxillofacial CT with contrast. ENT was consulted in the ER and he recommends to obtain maxillofacial CT with contrast once renal function improves We will place patient empirically on Unasyn adjusted to renal function  DVT prophylaxis: Apixaban Code Status: full code Family Communication: Greater than 50% of time was spent discussing patient's condition and plan of care with him at the bedside.  All questions and concerns have been addressed.  He verbalizes understanding and agrees with the plan.  CODE STATUS was discussed and he is a full code. Disposition Plan: Back to previous home environment Consults called: Cardiology Status: At the time of admission, it appears that the appropriate admission status for this patient is inpatient. This is judged to be reasonable and necessary in order to provide the required intensity of service to ensure the patient's safety given the presenting symptoms, physical exam findings and initial radiographic and laboratory data in the context of the comorbid conditions. Patient requires inpatient status due to intensity of service, high risk for further deterioration  and high frequency of surveillance required.    Collier Bullock MD [facility]     02/19/2021, 12:02 PM

## 2021-02-19 NOTE — ED Notes (Addendum)
Pt in CT.

## 2021-02-19 NOTE — ED Notes (Signed)
EDP at bedside on arrival.  

## 2021-02-20 ENCOUNTER — Encounter: Payer: Self-pay | Admitting: *Deleted

## 2021-02-20 ENCOUNTER — Inpatient Hospital Stay: Payer: PPO

## 2021-02-20 DIAGNOSIS — L8990 Pressure ulcer of unspecified site, unspecified stage: Secondary | ICD-10-CM | POA: Insufficient documentation

## 2021-02-20 DIAGNOSIS — I5022 Chronic systolic (congestive) heart failure: Secondary | ICD-10-CM

## 2021-02-20 DIAGNOSIS — N179 Acute kidney failure, unspecified: Secondary | ICD-10-CM | POA: Diagnosis not present

## 2021-02-20 LAB — BASIC METABOLIC PANEL
Anion gap: 10 (ref 5–15)
BUN: 68 mg/dL — ABNORMAL HIGH (ref 8–23)
CO2: 23 mmol/L (ref 22–32)
Calcium: 7.9 mg/dL — ABNORMAL LOW (ref 8.9–10.3)
Chloride: 100 mmol/L (ref 98–111)
Creatinine, Ser: 3.65 mg/dL — ABNORMAL HIGH (ref 0.61–1.24)
GFR, Estimated: 17 mL/min — ABNORMAL LOW (ref >60)
Glucose, Bld: 74 mg/dL (ref 70–99)
Potassium: 5.8 mmol/L — ABNORMAL HIGH (ref 3.5–5.1)
Sodium: 133 mmol/L — ABNORMAL LOW (ref 135–145)

## 2021-02-20 LAB — URINALYSIS, ROUTINE W REFLEX MICROSCOPIC
Bilirubin Urine: NEGATIVE
Glucose, UA: NEGATIVE mg/dL
Hgb urine dipstick: NEGATIVE
Ketones, ur: NEGATIVE mg/dL
Leukocytes,Ua: NEGATIVE
Nitrite: NEGATIVE
Protein, ur: NEGATIVE mg/dL
Specific Gravity, Urine: 1.011 (ref 1.005–1.030)
pH: 5 (ref 5.0–8.0)

## 2021-02-20 LAB — BLOOD GAS, VENOUS
Acid-base deficit: 2.6 mmol/L — ABNORMAL HIGH (ref 0.0–2.0)
Bicarbonate: 22.5 mmol/L (ref 20.0–28.0)
O2 Saturation: 80.2 %
Patient temperature: 37
pCO2, Ven: 39 mmHg — ABNORMAL LOW (ref 44.0–60.0)
pH, Ven: 7.37 (ref 7.250–7.430)
pO2, Ven: 46 mmHg — ABNORMAL HIGH (ref 32.0–45.0)

## 2021-02-20 LAB — CREATININE, SERUM
Creatinine, Ser: 3.63 mg/dL — ABNORMAL HIGH (ref 0.61–1.24)
GFR, Estimated: 17 mL/min — ABNORMAL LOW (ref >60)

## 2021-02-20 LAB — CBC WITH DIFFERENTIAL/PLATELET
Abs Immature Granulocytes: 0.04 10*3/uL (ref 0.00–0.07)
Basophils Absolute: 0 10*3/uL (ref 0.0–0.1)
Basophils Relative: 0 %
Eosinophils Absolute: 0.1 10*3/uL (ref 0.0–0.5)
Eosinophils Relative: 1 %
HCT: 24.7 % — ABNORMAL LOW (ref 39.0–52.0)
Hemoglobin: 7.7 g/dL — ABNORMAL LOW (ref 13.0–17.0)
Immature Granulocytes: 1 %
Lymphocytes Relative: 12 %
Lymphs Abs: 0.9 10*3/uL (ref 0.7–4.0)
MCH: 23.8 pg — ABNORMAL LOW (ref 26.0–34.0)
MCHC: 31.2 g/dL (ref 30.0–36.0)
MCV: 76.5 fL — ABNORMAL LOW (ref 80.0–100.0)
Monocytes Absolute: 0.7 10*3/uL (ref 0.1–1.0)
Monocytes Relative: 9 %
Neutro Abs: 6.3 10*3/uL (ref 1.7–7.7)
Neutrophils Relative %: 77 %
Platelets: 266 10*3/uL (ref 150–400)
RBC: 3.23 MIL/uL — ABNORMAL LOW (ref 4.22–5.81)
RDW: 20.3 % — ABNORMAL HIGH (ref 11.5–15.5)
WBC: 8.1 10*3/uL (ref 4.0–10.5)
nRBC: 0 % (ref 0.0–0.2)

## 2021-02-20 LAB — GLUCOSE, CAPILLARY
Glucose-Capillary: 123 mg/dL — ABNORMAL HIGH (ref 70–99)
Glucose-Capillary: 74 mg/dL (ref 70–99)
Glucose-Capillary: 117 mg/dL — ABNORMAL HIGH (ref 70–99)
Glucose-Capillary: 206 mg/dL — ABNORMAL HIGH (ref 70–99)
Glucose-Capillary: 180 mg/dL — ABNORMAL HIGH (ref 70–99)

## 2021-02-20 LAB — PROTEIN / CREATININE RATIO, URINE
Creatinine, Urine: 100 mg/dL
Protein Creatinine Ratio: 0.1 mg/mg{Cre} (ref 0.00–0.15)
Total Protein, Urine: 10 mg/dL

## 2021-02-20 LAB — HEPATITIS B SURFACE ANTIGEN: Hepatitis B Surface Ag: NONREACTIVE

## 2021-02-20 LAB — HEPATITIS C ANTIBODY: HCV Ab: NONREACTIVE

## 2021-02-20 LAB — HEPATITIS B SURFACE ANTIBODY,QUALITATIVE: Hep B S Ab: NONREACTIVE

## 2021-02-20 LAB — HEPATITIS B CORE ANTIBODY, TOTAL: Hep B Core Total Ab: NONREACTIVE

## 2021-02-20 LAB — HEPATITIS B CORE ANTIBODY, IGM: Hep B C IgM: NONREACTIVE

## 2021-02-20 MED ORDER — HYDROCORTISONE ACETATE 25 MG RE SUPP
25.0000 mg | Freq: Two times a day (BID) | RECTAL | Status: DC
  Administered 2021-02-20 – 2021-02-24 (×6): 25 mg via RECTAL
  Filled 2021-02-20 (×21): qty 1

## 2021-02-20 MED ORDER — IPRATROPIUM-ALBUTEROL 0.5-2.5 (3) MG/3ML IN SOLN
3.0000 mL | RESPIRATORY_TRACT | Status: DC | PRN
  Administered 2021-02-21 (×2): 3 mL via RESPIRATORY_TRACT
  Filled 2021-02-20 (×2): qty 3

## 2021-02-20 MED ORDER — SODIUM ZIRCONIUM CYCLOSILICATE 10 G PO PACK
10.0000 g | PACK | Freq: Once | ORAL | Status: AC
  Administered 2021-02-20: 10 g via ORAL
  Filled 2021-02-20: qty 1

## 2021-02-20 MED ORDER — OXYCODONE HCL 5 MG PO TABS
5.0000 mg | ORAL_TABLET | Freq: Four times a day (QID) | ORAL | Status: DC | PRN
  Administered 2021-02-20 – 2021-02-26 (×7): 5 mg via ORAL
  Filled 2021-02-20 (×7): qty 1

## 2021-02-20 MED ORDER — ALBUMIN HUMAN 25 % IV SOLN
25.0000 g | Freq: Once | INTRAVENOUS | Status: AC
  Administered 2021-02-20: 25 g via INTRAVENOUS
  Filled 2021-02-20: qty 100

## 2021-02-20 NOTE — Evaluation (Signed)
Physical Therapy Evaluation Patient Details Name: [name] MRN: [id number] DOB: [birth year] Today's Date: 02/20/2021   History of Present Illness  [name] is a 75 y.o. 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  Patient states that he has had weakness for the last 3 days which has made it difficult for him to ambulate.  This morning he states he was unable to get off the toilet and actually had a fall this morning.    Clinical Impression  Patient received in bed, somewhat lethargic. Wife present in room. Patient agreeable to PT assessment. Patient has significant pain in B feet, feet swollen and red, weeping. Per wife, patient had not had any therapy since last admission. Patient requires mod assist for supine to sit to move legs and raise trunk to sitting. Patient continues to be lethargic even while sitting at edge of bed. Patient returned to supine and placed on left side for pressure relief. Patient will continue to benefit from skilled PT while here to improve strength, functional mobility and independence.       Follow Up Recommendations SNF;Supervision for mobility/OOB    Equipment Recommendations  None recommended by PT    Recommendations for Other Services       Precautions / Restrictions Precautions Precautions: Fall Restrictions Weight Bearing Restrictions: No      Mobility  Bed Mobility Overal bed mobility: Needs Assistance Bed Mobility: Supine to Sit;Sit to Supine;Rolling Rolling: Mod assist   Supine to sit: Mod assist Sit to supine: Mod assist        Transfers                 General transfer comment: unable to attempt due to B foot pain  Ambulation/Gait              General Gait Details: unable to attempt due to B foot pain  Stairs            Wheelchair Mobility    Modified Rankin (Stroke Patients Only)       Balance Overall balance assessment: Needs assistance Sitting-balance support: Feet supported Sitting balance-Leahy Scale: Fair Sitting balance - Comments: Patient slightly slumped in sitting.                                     Pertinent Vitals/Pain Pain Assessment: Faces Faces Pain Scale: Hurts whole lot Pain Location: B feet to touch and with movement Pain Descriptors / Indicators: Guarding;Grimacing;Discomfort;Sore Pain Intervention(s): Limited activity within patient's tolerance;Monitored during session;Repositioned    Home Living Family/patient expects to be discharged to:: Private residence Living Arrangements: Spouse/significant other Available Help at Discharge: Family;Available PRN/intermittently Type of Home: House Home Access: Stairs to enter Entrance Stairs-Rails: Psychiatric nurse of Steps: 3-4 Home Layout: One level Home Equipment: Shower seat;Cane - single point;Wheelchair - Press photographer;Bedside commode;Walker - 4 wheels;Walker - 2 wheels      Prior Function Level of Independence: Needs assistance;Independent with assistive device(s)   Gait / Transfers Assistance Needed: MOD I with SPC household distances, electric scooter community distances. Endorsed that his wife will help him with ambulation as needed  ADL's / Homemaking Assistance Needed: wife assists with meals,  med management, lower body dressing  Comments: Home O2 since last admission     Hand Dominance   Dominant Hand: Right    Extremity/Trunk Assessment   Upper Extremity Assessment Upper Extremity Assessment: Generalized weakness    Lower Extremity Assessment Lower Extremity Assessment: Generalized weakness       Communication   Communication: HOH  Cognition Arousal/Alertness:  Lethargic Behavior During Therapy: WFL for tasks assessed/performed Overall Cognitive Status: Within Functional Limits for tasks assessed                                        General Comments      Exercises     Assessment/Plan    PT Assessment Patient needs continued PT services  PT Problem List Decreased strength;Decreased mobility;Decreased activity tolerance;Decreased balance;Pain       PT Treatment Interventions Therapeutic exercise;Gait training;Balance training;Stair training;Functional mobility training;Therapeutic activities;Patient/family education    PT Goals (Current goals can be found in the Care Plan section)  Acute Rehab PT Goals Patient Stated Goal: none stated this session PT Goal Formulation: With patient Time For Goal Achievement: 03/08/21    Frequency Min 2X/week   Barriers to discharge        Co-evaluation               AM-PAC PT "6 Clicks" Mobility  Outcome Measure Help needed turning from your back to your side while in a flat bed without using bedrails?: A Lot Help needed moving from lying on your back to sitting on the side of a flat bed without using bedrails?: A Lot Help needed moving to and from a bed to a chair (including a wheelchair)?: Total Help needed standing up from a chair using your arms (e.g., wheelchair or bedside chair)?: Total Help needed to walk in hospital room?: Total Help needed climbing 3-5 steps with a railing? : Total 6 Click Score: 8    End of Session Equipment Utilized During Treatment: Oxygen Activity Tolerance: Patient limited by fatigue;Patient limited by pain Patient left: in bed;with call bell/phone within reach;with bed alarm set;with family/visitor present Nurse Communication: Mobility status PT Visit Diagnosis: Other abnormalities of gait and mobility (R26.89);Muscle weakness (generalized) (M62.81);History of falling (Z91.81);Pain Pain - Right/Left:  (B) Pain - part of body: Ankle and  joints of foot    Time: 1445-1510 PT Time Calculation (min) (ACUTE ONLY): 25 min   Charges:   PT Evaluation $PT Eval Moderate Complexity: 1 Mod PT Treatments $Therapeutic Activity: 8-22 mins        Kyara Boxer, PT, GCS 02/20/21,3:37 PM

## 2021-02-20 NOTE — Progress Notes (Signed)
PT Cancellation Note  Patient Details Name: [name] MRN: [id number] DOB: [birth year]   Cancelled Treatment:    Reason Eval/Treat Not Completed: Fatigue/lethargy limiting ability to participate. Patient too sleepy at this time. Will re-attempt at later time/day.    Khamiya Varin 02/20/2021, 10:57 AM

## 2021-02-20 NOTE — Progress Notes (Signed)
Patient Name: [name] Date of Encounter: 02/20/2021  Hospital Problem List     Principal Problem:   AKI (acute kidney injury) [facility]) Active Problems:   Chronic kidney disease (CKD) stage G3b/A3, moderately decreased glomerular filtration rate (GFR) between 30-44 mL/min/1.73 square meter and albuminuria creatinine ratio greater than 300 mg/g (HCC)   History of CVA (cerebrovascular accident)   Chronic diastolic CHF (congestive heart failure) (HCC)   Atrial fibrillation, chronic (HCC)   Type 2 diabetes mellitus with hypoglycemia without coma (HCC)   Bradycardia   Weakness generalized   Dental infection   Pressure injury of skin    Patient Profile     75 y.o. male with history of coronary artery disease s/p CABG in 2006, history of HFrEF with most recent EF estimated between 50-55%, paroxsymal atrial fibrillation, anticoagulated with Eliquis, history of aortic valve stenosis, history of a CVA, chronic kidney disease, type 2 diabetes, hyperlipidemia, hypertension, and tobacco abuse who was admitted after presenting to the emergency room with acute congestive heart failure.  He has also evidence of volume depletion.  He has had several admissions recently for the same.  He is somewhat intermittently compliant with taking the proper diuretic dose.  He often will take multiple extra doses if he feels like he is fluid overloaded.  We have had discussions regarding this.  He also eats a fairly high sodium diet.  Discussions have been made regarding this however he eats frozen meals which typically contain more than 2 g of salt every meal.  He has chronically lower extremity edema.  He has attempted to keep them elevated and has not been compliant with compression garments.  He states he has had increasing weakness and fatigue over the last several days.  Morning of admission was a unable to get off the toilet due to severe lower extremity weakness with instability when ambulating due to leg weakness.   He denies syncope.  He denies chest pain.  He denies shortness of breath nausea or vomiting.  Laboratory showed a BUN of 70 and a creatinine of 3.95.  His baseline appears to have been a BUN and high 40s with a creatinine around 1.9-2.0.  His troponin was minimally elevated at 83.  Chest x-ray showed cardiomegaly with increased pulmonary interstitial thickening.  There was no evidence of overt congestive heart failure.  Cervical spine and head CT were unremarkable for acute abnormalities.  There was chronic small vessel ischemic disease and brain atrophy on the brain CT.  No evidence of cervical spine fracture.  EKG showed atrial fibrillation with slow ventricular response.  Review of records from office visits recently show heart rates in the 40-60 range.  His serum glucose was 48 on presentation.  He received D50 for this.  He reports compliance with his medications.  He continues to smoke.    Subjective   Still weak with peripheral edema  Inpatient Medications    . acidophilus  1 capsule Oral Daily  . apixaban  5 mg Oral BID  . cholecalciferol  1,000 Units Oral Daily  . gabapentin  100 mg Oral BID  . insulin aspart  0-15 Units Subcutaneous TID WC  . iron polysaccharides  150 mg Oral Daily  . magnesium oxide  400 mg Oral Daily  . multivitamin-lutein  1 capsule Oral Daily  . nicotine  21 mg Transdermal Daily  . pantoprazole  40 mg Oral Daily  . Ensure Max Protein  11 oz Oral BID BM  . simvastatin  20 mg Oral q AM  . sodium zirconium cyclosilicate  10 g Oral Once  . tiotropium  18 mcg Inhalation Daily  . traZODone  50 mg Oral QHS    Vital Signs    Vitals:   02/20/21 0439 02/20/21 0802 02/20/21 1042 02/20/21 1102  BP: (!) 119/50 (!) 103/50  (!) 104/50  Pulse: (!) 51 (!) 57  (!) 48  Resp: 17 18  18   Temp: 98.9 F (37.2 C) (!) 97.5 F (36.4 C)  97.7 F (36.5 C)  TempSrc: Oral     SpO2: 98% 97% 95% 93%  Weight:      Height:        Intake/Output Summary (Last 24 hours) at  02/20/2021 1336 Last data filed at 02/20/2021 1102 Gross per 24 hour  Intake 240 ml  Output 550 ml  Net -310 ml   Filed Weights   02/19/21 0836  Weight: 74 kg    Physical Exam    GEN: Chronically ill feeling HEENT: normal.  Neck: Supple, no JVD, carotid bruits, or masses. Cardiac: Irregular irregular bradycardic, 4+ lower extremity edema Respiratory:  Respirations regular and unlabored, clear to auscultation bilaterally. GI: Soft, nontender, nondistended, BS + x 4. MS: no deformity or atrophy. Skin: warm and dry, no rash. Neuro:  Strength and sensation are intact. Psych: Normal affect.  Labs    CBC Recent Labs    02/19/21 0849 02/20/21 0755  WBC 10.0 8.1  NEUTROABS 8.6* 6.3  HGB 8.1* 7.7*  HCT 26.2* 24.7*  MCV 78.0* 76.5*  PLT 276 268   Basic Metabolic Panel Recent Labs    02/19/21 0849 02/20/21 0454 02/20/21 0755  NA 133*  --  133*  K 4.6  --  5.8*  CL 103  --  100  CO2 20*  --  23  GLUCOSE 48*  --  74  BUN 70*  --  68*  CREATININE 3.95* 3.63* 3.65*  CALCIUM 7.5*  --  7.9*   Liver Function Tests Recent Labs    02/19/21 0849  AST 15  ALT 12  ALKPHOS 102  BILITOT 0.7  PROT 5.6*  ALBUMIN 2.8*   No results for input(s): LIPASE, AMYLASE in the last 72 hours. Cardiac Enzymes Recent Labs    02/19/21 1056  CKTOTAL 34*   BNP No results for input(s): BNP in the last 72 hours. D-Dimer No results for input(s): DDIMER in the last 72 hours. Hemoglobin A1C No results for input(s): HGBA1C in the last 72 hours. Fasting Lipid Panel No results for input(s): CHOL, HDL, LDLCALC, TRIG, CHOLHDL, LDLDIRECT in the last 72 hours. Thyroid Function Tests No results for input(s): TSH, T4TOTAL, T3FREE, THYROIDAB in the last 72 hours.  Invalid input(s): FREET3  Telemetry    Atrial fibrillation with slow ventricular response  ECG    A. fib with slow VR  Radiology    DG Chest 2 View  Result Date: 02/19/2021 CLINICAL DATA:  Weakness. EXAM: CHEST - 2 VIEW  COMPARISON:  02/02/2021 FINDINGS: Midline trachea. Moderate cardiomegaly. Trace left pleural fluid or thickening. No pneumothorax. Interstitial thickening is decreased compared to the prior exam. No lobar consolidation. IMPRESSION: Cardiomegaly with decreased pulmonary interstitial thickening. Likely related to COPD/chronic bronchitis. No overt congestive failure. Similar trace left pleural fluid or thickening. Electronically Signed   By: Abigail Miyamoto M.D.   On: 02/19/2021 09:51   CT Head Wo Contrast  Result Date: 02/19/2021 CLINICAL DATA:  Neck trauma. Generalized weakness for 2-3 days. History of stroke.  EXAM: CT HEAD WITHOUT CONTRAST CT CERVICAL SPINE WITHOUT CONTRAST TECHNIQUE: Multidetector CT imaging of the head and cervical spine was performed following the standard protocol without intravenous contrast. Multiplanar CT image reconstructions of the cervical spine were also generated. COMPARISON:  MRI brain 08/08/2016 FINDINGS: CT HEAD FINDINGS Brain: No evidence of acute infarction, hemorrhage, hydrocephalus, extra-axial collection or mass lesion/mass effect. There is mild diffuse low-attenuation within the subcortical and periventricular white matter compatible with chronic microvascular disease. Remote lacunar infarct in the left cerebellar hemisphere identified. Prominence of the sulci and ventricles compatible with brain atrophy. Vascular: No hyperdense vessel or unexpected calcification. Skull: Normal. Negative for fracture or focal lesion. Sinuses/Orbits: Paranasal sinuses appear clear. Mastoid air cells are clear. Other: There is a lucent bone lesion with pathologic fracture involving the maxilla measuring approximately 2.3 x 1.2 cm, image 9/3. This is only partially visualized. CT CERVICAL SPINE FINDINGS Alignment: Normal Skull base and vertebrae: The vertebral body heights are well maintained. No fractures identified. Soft tissues and spinal canal: No prevertebral fluid or swelling. No visible  canal hematoma. Disc levels: There is partial fusion of C4-5. Marked disc space narrowing, endplate sclerosis and ventral and dorsal osteophyte formation identified at C6-7. Upper chest: Pleural thickening versus small effusion noted within the posterior right chest. Other: None IMPRESSION: 1. No acute intracranial abnormality. 2. 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 3. No evidence for cervical spine fracture. 4. Marked degenerative disc disease identified C6-7 5. Expansile lucent lesion with pathologic fracture is identified involving the midline the maxilla. Recommend further evaluation with dedicated maxillofacial CT with contrast. Electronically Signed   By: Kerby Moors M.D.   On: 02/19/2021 09:38   CT CHEST WO CONTRAST  Result Date: 02/02/2021 CLINICAL DATA:  Respiratory failure EXAM: CT CHEST WITHOUT CONTRAST TECHNIQUE: Multidetector CT imaging of the chest was performed following the standard protocol without IV contrast. COMPARISON:  Chest radiograph 02/01/2021 FINDINGS: Cardiovascular: Coronary artery calcification and aortic atherosclerotic calcification. Post CABG Mediastinum/Nodes: No axillary supraclavicular adenopathy. Enlarged mediastinal lymph nodes are present. For example 17 mm RIGHT lower paratracheal node. 16 mm subcarinal node. Probable mild hilar adenopathy on the RIGHT. Lungs/Pleura: Respiratory motion degrades the lung imaging. Centrilobular emphysema the upper lobes. There bilateral small to moderate pleural effusions greater on the RIGHT. There is bibasilar passive atelectasis. Loculated pleural fluid at the LEFT lung base. There is diffuse ground-glass densities in the RIGHT lower lobe suggesting pulmonary infection or edema. Small amount of mucoid material in the trachea at the level of the carina. Upper Abdomen: Limited view of the liver, kidneys, pancreas are unremarkable. Normal adrenal glands. Musculoskeletal: Midline sternotomy. IMPRESSION: 1. Bilateral  small to moderate pleural effusions greater on the RIGHT. 2. Bibasilar atelectasis. Round atelectasis and loculated fluid at the LEFT lung base. 3. Diffuse ground-glass densities in the RIGHT lower lobe suggest early pneumonia, asymmetric edema or pneumonitis. No focal consolidation. 4. Centrilobular emphysema the upper lobes. 5. Mild mediastinal adenopathy is favored reactive related to COPD. Electronically Signed   By: Suzy Bouchard M.D.   On: 02/02/2021 11:44   CT Cervical Spine Wo Contrast  Result Date: 02/19/2021 CLINICAL DATA:  Neck trauma. Generalized weakness for 2-3 days. History of stroke. EXAM: CT HEAD WITHOUT CONTRAST CT CERVICAL SPINE WITHOUT CONTRAST TECHNIQUE: Multidetector CT imaging of the head and cervical spine was performed following the standard protocol without intravenous contrast. Multiplanar CT image reconstructions of the cervical spine were also generated. COMPARISON:  MRI brain 08/08/2016 FINDINGS: CT HEAD  FINDINGS Brain: No evidence of acute infarction, hemorrhage, hydrocephalus, extra-axial collection or mass lesion/mass effect. There is mild diffuse low-attenuation within the subcortical and periventricular white matter compatible with chronic microvascular disease. Remote lacunar infarct in the left cerebellar hemisphere identified. Prominence of the sulci and ventricles compatible with brain atrophy. Vascular: No hyperdense vessel or unexpected calcification. Skull: Normal. Negative for fracture or focal lesion. Sinuses/Orbits: Paranasal sinuses appear clear. Mastoid air cells are clear. Other: There is a lucent bone lesion with pathologic fracture involving the maxilla measuring approximately 2.3 x 1.2 cm, image 9/3. This is only partially visualized. CT CERVICAL SPINE FINDINGS Alignment: Normal Skull base and vertebrae: The vertebral body heights are well maintained. No fractures identified. Soft tissues and spinal canal: No prevertebral fluid or swelling. No visible canal  hematoma. Disc levels: There is partial fusion of C4-5. Marked disc space narrowing, endplate sclerosis and ventral and dorsal osteophyte formation identified at C6-7. Upper chest: Pleural thickening versus small effusion noted within the posterior right chest. Other: None IMPRESSION: 1. No acute intracranial abnormality. 2. 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 3. No evidence for cervical spine fracture. 4. Marked degenerative disc disease identified C6-7 5. Expansile lucent lesion with pathologic fracture is identified involving the midline the maxilla. Recommend further evaluation with dedicated maxillofacial CT with contrast. Electronically Signed   By: Kerby Moors M.D.   On: 02/19/2021 09:38   DG Chest Port 1 View  Result Date: 02/02/2021 CLINICAL DATA:  Acute respiratory failure EXAM: PORTABLE CHEST 1 VIEW COMPARISON:  Yesterday FINDINGS: Cardiomegaly. Prior CABG. Generous lung volumes. Diffuse interstitial opacity with cephalized blood flow and Kerley lines. The degree of opacity of is above prior baseline. Small bilateral pleural effusion. IMPRESSION: CHF pattern superimposed on a background of chronic lung disease. Electronically Signed   By: Monte Fantasia M.D.   On: 02/02/2021 11:15   DG Chest Port 1 View  Result Date: 02/01/2021 CLINICAL DATA:  Respiratory distress EXAM: PORTABLE CHEST 1 VIEW COMPARISON:  01/28/2021, 11/15/2020, 10/25/2020, 09/26/2019 FINDINGS: Post sternotomy changes. Diffuse bilateral interstitial opacity, some of which is felt secondary to chronic disease. There is slight increased ground-glass opacity bilaterally suspicious for superimposed acute edema or possible pneumonia. Blunting at the left costophrenic sulcus probably chronic and due to scarring. Mild cardiomegaly with aortic atherosclerosis. No pneumothorax. IMPRESSION: Suspect some degree of chronic interstitial changes. However there is increased interstitial and ground-glass opacity bilaterally  suspicious for superimposed acute edema or possible pneumonia. Mild cardiomegaly. Electronically Signed   By: Donavan Foil M.D.   On: 02/01/2021 15:35   DG Chest Portable 1 View  Result Date: 01/28/2021 CLINICAL DATA:  Dyspnea EXAM: PORTABLE CHEST 1 VIEW COMPARISON:  11/15/2020 FINDINGS: The lungs are well expanded. Mild left-sided volume loss with associated left basilar pleural thickening is unchanged. Linear atelectasis within the left mid lung zone. Diffuse interstitial thickening again noted, unchanged. No superimposed focal pulmonary infiltrate. No pneumothorax or pleural effusion. Coronary artery bypass grafting has been performed. Cardiac size within normal limits. IMPRESSION: No active disease. Stable mild left-sided volume loss with left pleural thickening. Electronically Signed   By: Fidela Salisbury MD   On: 01/28/2021 04:11    Assessment & Plan    75 y.o. male with history of coronary artery disease s/p CABG in 2006, history of HFrEF with most recent EF estimated between 50-55%, paroxsymal atrial fibrillation, anticoagulated with Eliquis, history of aortic valve stenosis, history of a CVA, chronic kidney disease, type 2 diabetes, hyperlipidemia, hypertension,  and tobacco abuse who was admitted after presenting to the emergency room  1.  Acute kidney.  Injury-May be due to overdiuresis.  Appreciate nephrology input.  Will carefully hydrate.  SPEP and UPEP pending.    2.  Peripheral edema-chronic.  May need Unna boots.  Low-sodium diet.  Keep feet elevated.  3.  Chronic diastolic heart failure-no obvious pulmonary edema on chest x-ray but has peripheral edema.  Carefully hydrating.  Appreciate nephrology input.  4.  Atrial fibrillation-slow ventricular response.  This has been chronic.  We will avoid rate limiting medications at present.  At present.  We will continue with anticoagulation.  May need to reduce Eliquis dose should his renal function worsen any further.  GFR 17 at  present.  Age less than 60 and weight greater than 60 kg.   Signed, Javier Docker Kajuana Shareef MD 02/20/2021, 1:36 PM  Pager: [phone]

## 2021-02-20 NOTE — Progress Notes (Signed)
PROGRESS NOTE    [name]  WCH:[id number] DOB: [birth year] DOA: 02/19/2021 PCP: [name], MD   Brief Narrative:  [name] is a 75 y.o. 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 Patient states that he has had weakness for the last 3 days which has made it difficult for him to ambulate.  This morning he states he was unable to get off the toilet and actually had a fall this morning. He denies feeling dizzy or lightheaded and complains of just generalized weakness. He denies having any chest pain, no shortness of breath, no fever, no chills, no headache, no abdominal pain, no nausea, no vomiting, no diarrhea, no urinary symptoms, no blurred vision, no difficulty swallowing, no headache.  Found to be bradycardic, hypoglycemic, borderline hypotensive.  Found to have significant AKI thought to be due to overdiuresis.  Case discussed with cardiology and nephrology.  Recommendations appreciated.   Assessment & Plan:   Principal Problem:   AKI (acute kidney injury) ([place]) Active Problems:   Chronic kidney disease (CKD) stage G3b/A3, moderately decreased glomerular filtration rate (GFR) between 30-44 mL/min/1.73 square meter and albuminuria creatinine ratio greater than 300 mg/g (HCC)   History of CVA (cerebrovascular accident)   Chronic diastolic CHF (congestive heart failure) (HCC)   Atrial fibrillation, chronic (HCC)   Type 2 diabetes mellitus with hypoglycemia without coma (HCC)   Bradycardia   Weakness generalized   Dental infection   Pressure injury of skin  Acute kidney injury on chronic kidney disease stage IIIb Hyperkalemia Hyponatremia Thought to be secondary to overdiuresis Patient has no edema on  chest x-ray but does have significant peripheral edema At baseline patient has a serum creatinine of 1.95, on admission his creatinine is 3.95 Plan: Gentle IV hydration Hold torsemide, losartan, Lasix Nephrology consulted, recommendations appreciated SPEP and UPEP pending Lokelma 10 g x 1  Atrial fibrillation with slow ventricular response Chronic issue per Dr. Ubaldo Glassing who the patient is well-known to Plan: Avoid AV nodal blocking agents Continue apixaban for anticoagulation, may need dose reduction if kidney function does not recover  Diabetes mellitus with complications of stage III chronic kidney disease and hypoglycemia Hold insulin Blood sugar checks before meals and at bedtime Encourage oral intake    COPD with chronic respiratory failure Not acutely exacerbated Continue Spiriva, as needed bronchodilator therapy and inhaled steroids    Chronic diastolic dysfunction CHF Not acutely exacerbated Hold torsemide and Cozaar due to acute kidney injury Hold amlodipine and hydralazine since patient is normotensive    Coronary artery disease Stable and not acutely exacerbated Continue statins and aspirin Hold nitrates    Generalized weakness/fall  Multifactorial and related to bradycardia as well as hypotension Place patient on fall precautions We will request PT evaluation once his acute illness improves/resolves    Dental infection Patient noted to have purulence around his gum line Cervical spine CT shows an expansile lucent lesion with pathologic fracture  identified involving the midline the maxilla. Recommend further evaluation with dedicated maxillofacial CT with contrast. ENT was consulted in the ER and he recommends to obtain maxillofacial CT with contrast once renal function improves We will place patient empirically on Unasyn adjusted to renal function   DVT prophylaxis: Eliquis Code Status: Full Family Communication:  None  today Disposition Plan: Status is: Inpatient  Remains inpatient appropriate because:Inpatient level of care appropriate due to severity of illness   Dispo: The patient is from: Home              Anticipated d/c is to: Home              Patient currently is not medically stable to d/c.   Difficult to place patient No  Hospital day 2.  AKI on CKD stage IIIb and atrial fibrillation with slow ventricular response.  Disposition plan pending.  Anticipate discharge home.     Level of care: Progressive Cardiac  Consultants:  Nephrology Cardiology   Procedures:None  Antimicrobials:   Unasyn   Subjective: Seen and examined.  Lethargic but answers all questions appropriately.  No pain complaints  Objective: Vitals:   02/20/21 0802 02/20/21 1042 02/20/21 1102 02/20/21 1527  BP: (!) 103/50  (!) 104/50 (!) 117/48  Pulse: (!) 57  (!) 48 (!) 53  Resp: 18  18 18   Temp: (!) 97.5 F (36.4 C)  97.7 F (36.5 C) 99 F (37.2 C)  TempSrc:    Oral  SpO2: 97% 95% 93% 90%  Weight:      Height:        Intake/Output Summary (Last 24 hours) at 02/20/2021 1608 Last data filed at 02/20/2021 1102 Gross per 24 hour  Intake 240 ml  Output 550 ml  Net -310 ml   Filed Weights   02/19/21 0836  Weight: 74 kg    Examination:  General exam: No acute distress.  Appears chronically ill Respiratory system: Poor respiratory effort.  Normal work of breathing.  2 L Cardiovascular system: S1-S2, bradycardic, no appreciable murmurs, 2+ pedal edema Gastrointestinal system: Abdomen is nondistended, soft and nontender. No organomegaly or masses felt. Normal bowel sounds heard. Central nervous system: Alert, oriented x2, no focal deficits, lethargic extremities: Symmetric 5 x 5 power. Skin: No rashes, lesions or ulcers Psychiatry: Judgement and insight appear impaired. Mood & affect flattened.     Data Reviewed: I have personally reviewed following labs and imaging studies  CBC: Recent Labs   Lab 02/19/21 0849 02/20/21 0755  WBC 10.0 8.1  NEUTROABS 8.6* 6.3  HGB 8.1* 7.7*  HCT 26.2* 24.7*  MCV 78.0* 76.5*  PLT 276 222   Basic Metabolic Panel: Recent Labs  Lab 02/19/21 0849 02/20/21 0454 02/20/21 0755  NA 133*  --  133*  K 4.6  --  5.8*  CL 103  --  100  CO2 20*  --  23  GLUCOSE 48*  --  74  BUN 70*  --  68*  CREATININE 3.95* 3.63* 3.65*  CALCIUM 7.5*  --  7.9*   GFR: Estimated Creatinine Clearance: 15.8 mL/min (A) (by C-G formula based on SCr of 3.65 mg/dL (H)). Liver Function Tests: Recent Labs  Lab 02/19/21 0849  AST 15  ALT 12  ALKPHOS 102  BILITOT 0.7  PROT 5.6*  ALBUMIN 2.8*   No results for input(s): LIPASE, AMYLASE in the last 168 hours. No results for input(s): AMMONIA in the last 168 hours. Coagulation Profile: No results for input(s): INR, PROTIME in the last 168 hours. Cardiac Enzymes: Recent Labs  Lab 02/19/21 1056  CKTOTAL 34*   BNP (last 3 results) No results for input(s): PROBNP in the last 8760 hours. HbA1C: No results for input(s): HGBA1C in the last 72 hours. CBG: Recent Labs  Lab 02/19/21 2114 02/19/21 2221 02/20/21 0135 02/20/21 9798  02/20/21 1152  GLUCAP 96 123* 123* 74 117*   Lipid Profile: No results for input(s): CHOL, HDL, LDLCALC, TRIG, CHOLHDL, LDLDIRECT in the last 72 hours. Thyroid Function Tests: No results for input(s): TSH, T4TOTAL, FREET4, T3FREE, THYROIDAB in the last 72 hours. Anemia Panel: No results for input(s): VITAMINB12, FOLATE, FERRITIN, TIBC, IRON, RETICCTPCT in the last 72 hours. Sepsis Labs: No results for input(s): PROCALCITON, LATICACIDVEN in the last 168 hours.  Recent Results (from the past 240 hour(s))  Resp Panel by RT-PCR (Flu A&B, Covid) Nasopharyngeal Swab     Status: None   Collection Time: 02/19/21  9:56 AM   Specimen: Nasopharyngeal Swab; Nasopharyngeal(NP) swabs in vial transport medium  Result Value Ref Range Status   SARS Coronavirus 2 by RT PCR NEGATIVE NEGATIVE Final     Comment: (NOTE) SARS-CoV-2 target nucleic acids are NOT DETECTED.  The SARS-CoV-2 RNA is generally detectable in upper respiratory specimens during the acute phase of infection. The lowest concentration of SARS-CoV-2 viral copies this assay can detect is 138 copies/mL. A negative result does not preclude SARS-Cov-2 infection and should not be used as the sole basis for treatment or other patient management decisions. A negative result may occur with  improper specimen collection/handling, submission of specimen other than nasopharyngeal swab, presence of viral mutation(s) within the areas targeted by this assay, and inadequate number of viral copies(<138 copies/mL). A negative result must be combined with clinical observations, patient history, and epidemiological information. The expected result is Negative.  Fact Sheet for Patients:  EntrepreneurPulse.com.au  Fact Sheet for Healthcare Providers:  IncredibleEmployment.be  This test is no t yet approved or cleared by the [place] FDA and  has been authorized for detection and/or diagnosis of SARS-CoV-2 by FDA under an Emergency Use Authorization (EUA). This EUA will remain  in effect (meaning this test can be used) for the duration of the COVID-19 declaration under Section 564(b)(1) of the Act, 21 U.S.C.section 360bbb-3(b)(1), unless the authorization is terminated  or revoked sooner.       Influenza A by PCR NEGATIVE NEGATIVE Final   Influenza B by PCR NEGATIVE NEGATIVE Final    Comment: (NOTE) The Xpert Xpress SARS-CoV-2/FLU/RSV plus assay is intended as an aid in the diagnosis of influenza from Nasopharyngeal swab specimens and should not be used as a sole basis for treatment. Nasal washings and aspirates are unacceptable for Xpert Xpress SARS-CoV-2/FLU/RSV testing.  Fact Sheet for Patients: EntrepreneurPulse.com.au  Fact Sheet for Healthcare  Providers: IncredibleEmployment.be  This test is not yet approved or cleared by the [place] FDA and has been authorized for detection and/or diagnosis of SARS-CoV-2 by FDA under an Emergency Use Authorization (EUA). This EUA will remain in effect (meaning this test can be used) for the duration of the COVID-19 declaration under Section 564(b)(1) of the Act, 21 U.S.C. section 360bbb-3(b)(1), unless the authorization is terminated or revoked.  Performed at [facility], [address], [place], [place] [address]          Radiology Studies: DG Chest 2 View  Result Date: 02/19/2021 CLINICAL DATA:  Weakness. EXAM: CHEST - 2 VIEW COMPARISON:  02/02/2021 FINDINGS: Midline trachea. Moderate cardiomegaly. Trace left pleural fluid or thickening. No pneumothorax. Interstitial thickening is decreased compared to the prior exam. No lobar consolidation. IMPRESSION: Cardiomegaly with decreased pulmonary interstitial thickening. Likely related to COPD/chronic bronchitis. No overt congestive failure. Similar trace left pleural fluid or thickening. Electronically Signed   By: Abigail Miyamoto M.D.   On: 02/19/2021  09:51   CT Head Wo Contrast  Result Date: 02/19/2021 CLINICAL DATA:  Neck trauma. Generalized weakness for 2-3 days. History of stroke. EXAM: CT HEAD WITHOUT CONTRAST CT CERVICAL SPINE WITHOUT CONTRAST TECHNIQUE: Multidetector CT imaging of the head and cervical spine was performed following the standard protocol without intravenous contrast. Multiplanar CT image reconstructions of the cervical spine were also generated. COMPARISON:  MRI brain 08/08/2016 FINDINGS: CT HEAD FINDINGS Brain: No evidence of acute infarction, hemorrhage, hydrocephalus, extra-axial collection or mass lesion/mass effect. There is mild diffuse low-attenuation within the subcortical and periventricular white matter compatible with chronic microvascular disease. Remote lacunar infarct in the  left cerebellar hemisphere identified. Prominence of the sulci and ventricles compatible with brain atrophy. Vascular: No hyperdense vessel or unexpected calcification. Skull: Normal. Negative for fracture or focal lesion. Sinuses/Orbits: Paranasal sinuses appear clear. Mastoid air cells are clear. Other: There is a lucent bone lesion with pathologic fracture involving the maxilla measuring approximately 2.3 x 1.2 cm, image 9/3. This is only partially visualized. CT CERVICAL SPINE FINDINGS Alignment: Normal Skull base and vertebrae: The vertebral body heights are well maintained. No fractures identified. Soft tissues and spinal canal: No prevertebral fluid or swelling. No visible canal hematoma. Disc levels: There is partial fusion of C4-5. Marked disc space narrowing, endplate sclerosis and ventral and dorsal osteophyte formation identified at C6-7. Upper chest: Pleural thickening versus small effusion noted within the posterior right chest. Other: None IMPRESSION: 1. No acute intracranial abnormality. 2. 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 3. No evidence for cervical spine fracture. 4. Marked degenerative disc disease identified C6-7 5. Expansile lucent lesion with pathologic fracture is identified involving the midline the maxilla. Recommend further evaluation with dedicated maxillofacial CT with contrast. Electronically Signed   By: Kerby Moors M.D.   On: 02/19/2021 09:38   CT Cervical Spine Wo Contrast  Result Date: 02/19/2021 CLINICAL DATA:  Neck trauma. Generalized weakness for 2-3 days. History of stroke. EXAM: CT HEAD WITHOUT CONTRAST CT CERVICAL SPINE WITHOUT CONTRAST TECHNIQUE: Multidetector CT imaging of the head and cervical spine was performed following the standard protocol without intravenous contrast. Multiplanar CT image reconstructions of the cervical spine were also generated. COMPARISON:  MRI brain 08/08/2016 FINDINGS: CT HEAD FINDINGS Brain: No evidence of acute  infarction, hemorrhage, hydrocephalus, extra-axial collection or mass lesion/mass effect. There is mild diffuse low-attenuation within the subcortical and periventricular white matter compatible with chronic microvascular disease. Remote lacunar infarct in the left cerebellar hemisphere identified. Prominence of the sulci and ventricles compatible with brain atrophy. Vascular: No hyperdense vessel or unexpected calcification. Skull: Normal. Negative for fracture or focal lesion. Sinuses/Orbits: Paranasal sinuses appear clear. Mastoid air cells are clear. Other: There is a lucent bone lesion with pathologic fracture involving the maxilla measuring approximately 2.3 x 1.2 cm, image 9/3. This is only partially visualized. CT CERVICAL SPINE FINDINGS Alignment: Normal Skull base and vertebrae: The vertebral body heights are well maintained. No fractures identified. Soft tissues and spinal canal: No prevertebral fluid or swelling. No visible canal hematoma. Disc levels: There is partial fusion of C4-5. Marked disc space narrowing, endplate sclerosis and ventral and dorsal osteophyte formation identified at C6-7. Upper chest: Pleural thickening versus small effusion noted within the posterior right chest. Other: None IMPRESSION: 1. No acute intracranial abnormality. 2. 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 3. No evidence for cervical spine fracture. 4. Marked degenerative disc disease identified C6-7 5. Expansile lucent lesion with pathologic fracture is identified involving the  midline the maxilla. Recommend further evaluation with dedicated maxillofacial CT with contrast. Electronically Signed   By: Kerby Moors M.D.   On: 02/19/2021 09:38   US RENAL  Result Date: 02/20/2021 CLINICAL DATA:  Acute renal failure.  Chronic renal disease. EXAM: RENAL / URINARY TRACT ULTRASOUND COMPLETE COMPARISON:  No recent prior. FINDINGS: Right Kidney: Renal measurements: 9.8 x 4.5 x 4.0 cm = volume: 91.2 mL. Mild  increased echogenicity. No mass or hydronephrosis visualized. Left Kidney: Renal measurements: 9.4 x 5.3 x 5.1 cm = volume: 132.8 mL. Mild increased echogenicity. No mass or hydronephrosis visualized. Bladder: Mild bladder distention cannot be excluded. Other: Small right pleural effusion cannot be excluded. IMPRESSION: 1. Mild increased echogenicity both kidneys suggesting chronic medical renal disease. No acute renal abnormality identified. 2.  Bladder distention cannot be excluded. 3.  Small right pleural effusion cannot be excluded. Electronically Signed   By: Marcello Moores  Register   On: 02/20/2021 13:37        Scheduled Meds: . acidophilus  1 capsule Oral Daily  . apixaban  5 mg Oral BID  . cholecalciferol  1,000 Units Oral Daily  . gabapentin  100 mg Oral BID  . hydrocortisone  25 mg Rectal BID  . insulin aspart  0-15 Units Subcutaneous TID WC  . iron polysaccharides  150 mg Oral Daily  . magnesium oxide  400 mg Oral Daily  . multivitamin-lutein  1 capsule Oral Daily  . nicotine  21 mg Transdermal Daily  . pantoprazole  40 mg Oral Daily  . Ensure Max Protein  11 oz Oral BID BM  . simvastatin  20 mg Oral q AM  . tiotropium  18 mcg Inhalation Daily  . traZODone  50 mg Oral QHS   Continuous Infusions: . sodium chloride 100 mL/hr at 02/19/21 1945  . ampicillin-sulbactam (UNASYN) IV 3 g (02/20/21 1335)     LOS: 1 day    Time spent: 25 minutes    Sidney Ace, MD [facility] Pager [phone]  If 7PM-7AM, please contact night-coverage 02/20/2021, 4:08 PM

## 2021-02-20 NOTE — Consult Note (Signed)
[facility]  CONSULT NOTE    Date: 02/20/2021                  Patient Name:  [name]  MRN: [id number]  DOB: 1946/08/11  Age / Sex: 75 y.o., male         PCP: [name], MD                 Service Requesting Consult: Dr. Priscella Mann                 Reason for Consult: Acute kidney injury            History of Present Illness: [name] admitted to [facility] for generalized weakness and fall. Patient has been unable to ambulate for last three days and he has not beeing able to eat much. He continues to take all his medications as prescribed. Patient states he has known chronic kidney disease but has never seen a nephrologist.   Patient found to have a fracture of his cervical spine.   Patient with acute renal failure with peripheral edema. Nephrology was consulted.    Medications: Outpatient medications: Medications Prior to Admission  Medication Sig Dispense Refill Last Dose  . albuterol (PROVENTIL HFA;VENTOLIN HFA) 108 (90 Base) MCG/ACT inhaler Inhale 2 puffs into the lungs every 6 (six) hours as needed for wheezing or shortness of breath.   02/18/2021 at Unknown time  . amLODipine (NORVASC) 5 MG tablet Take 1 tablet (5 mg total) by mouth daily. 30 tablet 1 02/18/2021 at Unknown time  . apixaban (ELIQUIS) 5 MG TABS tablet Take 1 tablet (5 mg total) by mouth 2 (two) times daily. 180 tablet 3 02/18/2021 at Unknown time  . cholecalciferol (VITAMIN D) 1000 units tablet Take 1,000 Units by mouth daily.   02/18/2021 at Unknown time  . Cinnamon 500 MG capsule Take 1,000 mg by mouth 2 (two) times daily.   02/18/2021 at Unknown time  . COMBIVENT RESPIMAT 20-100 MCG/ACT AERS respimat Inhale 2 puffs into the lungs 4 (four) times daily as needed.   02/18/2021 at Unknown time  . CRANBERRY EXTRACT PO Take 500 mg by mouth every morning.    02/18/2021 at AM  . gabapentin (NEURONTIN) 100 MG capsule Take 100 mg by mouth 2 (two) times daily.   02/18/2021 at PM  . hydrALAZINE  (APRESOLINE) 50 MG tablet Take 1 tablet (50 mg total) by mouth every 8 (eight) hours.   02/18/2021 at Unknown time  . insulin aspart (NOVOLOG) 100 UNIT/ML injection 0-9 Units, Subcutaneous, 3 times daily with meals CBG < 70: Implement Hypoglycemia protocol/measures CBG 70 - 120: 0 units CBG 121 - 150: 1 unit CBG 151 - 200: 2 units CBG 201 - 250: 3 units CBG 251 - 300: 5 units CBG 301 - 350: 7 units CBG 351 - 400: 9 units CBG > 400: call MD (Patient taking differently: Inject into the skin 3 (three) times daily with meals. 0-9 Units, Subcutaneous, 3 times daily with meals CBG < 70: Implement Hypoglycemia protocol/measures CBG 70 - 120: 0 units CBG 121 - 150: 1 unit CBG 151 - 200: 2 units CBG 201 - 250: 3 units CBG 251 - 300: 5 units CBG 301 - 350: 7 units CBG 351 - 400: 9 units CBG > 400: call MD) 10 mL 11 02/18/2021 at Unknown time  . iron polysaccharides (NIFEREX) 150 MG capsule Take 1 capsule (150 mg total) by mouth daily. Can take any over-the-counter  iron supplement.   02/18/2021 at Unknown time  . isosorbide mononitrate (IMDUR) 30 MG 24 hr tablet Take 1 tablet (30 mg total) by mouth daily.   02/18/2021 at Unknown time  . LANTUS SOLOSTAR 100 UNIT/ML Solostar Pen Inject 20 Units into the skin daily at 10 pm. This is reduced from your prior 45 units nightly. 15 mL 11 02/18/2021 at Unknown time  . losartan (COZAAR) 25 MG tablet Take 25 mg by mouth daily.     . magnesium oxide (MAG-OX) 400 MG tablet Take 400 mg by mouth daily.   02/18/2021 at Unknown time  . Multiple Vitamin (MULTIVITAMIN WITH MINERALS) TABS tablet Take 1 tablet by mouth daily.   02/18/2021 at Unknown time  . nicotine (NICODERM CQ - DOSED IN MG/24 HOURS) 21 mg/24hr patch Place 1 patch (21 mg total) onto the skin daily for 28 days. 28 patch 0 02/19/2021 at Unknown time  . omeprazole (PRILOSEC) 20 MG capsule Take 20 mg by mouth daily.   02/18/2021 at Unknown time  . potassium chloride SA (KLOR-CON) 20 MEQ tablet Take 20 mEq by mouth  2 (two) times daily.   02/18/2021 at Unknown time  . simvastatin (ZOCOR) 20 MG tablet Take 20 mg by mouth in the morning.   02/18/2021 at AM  . SUPER B COMPLEX/C PO Take 1 tablet by mouth every morning.   Past Week at Unknown time  . tiotropium (SPIRIVA HANDIHALER) 18 MCG inhalation capsule Place 1 capsule (18 mcg total) into inhaler and inhale daily. 30 capsule 2 Past Week at Unknown time  . torsemide (DEMADEX) 20 MG tablet Take 40 mg by mouth daily.   02/18/2021 at Unknown time  . traZODone (DESYREL) 50 MG tablet Take 50 mg by mouth at bedtime.   02/18/2021 at Unknown time  . vitamin C (ASCORBIC ACID) 500 MG tablet Take 500 mg by mouth 2 (two) times daily.    02/18/2021 at Unknown time  . acetaminophen (TYLENOL) 325 MG tablet Take 2 tablets (650 mg total) by mouth every 6 (six) hours as needed for mild pain (or Fever >/= 101).   PRN  . bifidobacterium infantis (ALIGN) capsule Take 1 capsule by mouth every morning.     . Ensure Max Protein (ENSURE MAX PROTEIN) LIQD Take 330 mLs (11 oz total) by mouth 2 (two) times daily between meals.     Marland Kitchen glucose 4 GM chewable tablet Chew 1 tablet by mouth as needed for low blood sugar.     . lactase (LACTAID) 3000 units tablet Take 3,000 Units by mouth 3 (three) times daily between meals as needed.     . multivitamin-lutein (OCUVITE-LUTEIN) CAPS capsule Take 1 capsule by mouth daily.       Current medications: Current Facility-Administered Medications  Medication Dose Route Frequency Provider Last Rate Last Admin  . 0.9 %  sodium chloride infusion   Intravenous Continuous Agbata, Tochukwu, MD 100 mL/hr at 02/19/21 1945 New Bag at 02/19/21 1945  . acetaminophen (TYLENOL) tablet 650 mg  650 mg Oral Q6H PRN Agbata, Tochukwu, MD   650 mg at 02/20/21 0945  . acidophilus (RISAQUAD) capsule 1 capsule  1 capsule Oral Daily Agbata, Tochukwu, MD   1 capsule at 02/20/21 0947  . albuterol (VENTOLIN HFA) 108 (90 Base) MCG/ACT inhaler 2 puff  2 puff Inhalation Q6H PRN Agbata,  Tochukwu, MD      . Ampicillin-Sulbactam (UNASYN) 3 g in sodium chloride 0.9 % 100 mL IVPB  3 g Intravenous Q12H Ardeen Garland  L, RPH 200 mL/hr at 02/20/21 0245 3 g at 02/20/21 0245  . apixaban (ELIQUIS) tablet 5 mg  5 mg Oral BID Agbata, Tochukwu, MD   5 mg at 02/20/21 0947  . cholecalciferol (VITAMIN D3) tablet 1,000 Units  1,000 Units Oral Daily Agbata, Tochukwu, MD   1,000 Units at 02/20/21 0946  . gabapentin (NEURONTIN) capsule 100 mg  100 mg Oral BID Agbata, Tochukwu, MD   100 mg at 02/20/21 0947  . insulin aspart (novoLOG) injection 0-15 Units  0-15 Units Subcutaneous TID WC Agbata, Tochukwu, MD      . Ipratropium-Albuterol (COMBIVENT) respimat 2 puff  2 puff Inhalation Q6H PRN Agbata, Tochukwu, MD      . iron polysaccharides (NIFEREX) capsule 150 mg  150 mg Oral Daily Agbata, Tochukwu, MD   150 mg at 02/20/21 0946  . magnesium oxide (MAG-OX) tablet 400 mg  400 mg Oral Daily Agbata, Tochukwu, MD   400 mg at 02/20/21 0947  . multivitamin-lutein (OCUVITE-LUTEIN) capsule 1 capsule  1 capsule Oral Daily Agbata, Tochukwu, MD   1 capsule at 02/20/21 0946  . nicotine (NICODERM CQ - dosed in mg/24 hours) patch 21 mg  21 mg Transdermal Daily Agbata, Tochukwu, MD   21 mg at 02/20/21 0946  . ondansetron (ZOFRAN) tablet 4 mg  4 mg Oral Q6H PRN Agbata, Tochukwu, MD       Or  . ondansetron (ZOFRAN) injection 4 mg  4 mg Intravenous Q6H PRN Agbata, Tochukwu, MD      . pantoprazole (PROTONIX) EC tablet 40 mg  40 mg Oral Daily Agbata, Tochukwu, MD   40 mg at 02/20/21 0946  . protein supplement (ENSURE MAX) liquid  11 oz Oral BID BM Agbata, Tochukwu, MD      . simvastatin (ZOCOR) tablet 20 mg  20 mg Oral q AM Agbata, Tochukwu, MD   20 mg at 02/20/21 0958  . sodium zirconium cyclosilicate (LOKELMA) packet 10 g  10 g Oral Once Ralene Muskrat B, MD      . tiotropium (SPIRIVA) inhalation capsule ([facility] use ONLY) 18 mcg  18 mcg Inhalation Daily Agbata, Tochukwu, MD   18 mcg at 02/19/21 1319  . traZODone  (DESYREL) tablet 50 mg  50 mg Oral QHS Agbata, Tochukwu, MD   50 mg at 02/19/21 2322      Allergies: Allergies  Allergen Reactions  . Lactose Intolerance (Gi)       Past Medical History: Past Medical History:  Diagnosis Date  . Arrhythmia    atrial fibrillation  . Atrial fibrillation ([place])   . Basal cell carcinoma 05/2019   right nasal ala, Tx: EDC  . Basal cell carcinoma 04/12/2019   Left posterior ear. Nodular pattern, excoriated.   . CHF (congestive heart failure) ([place])   . COPD (chronic obstructive pulmonary disease) ([place])   . Coronary artery disease   . Diabetes mellitus without complication ([place])   . GERD (gastroesophageal reflux disease)   . HLD (hyperlipidemia)   . Hypertension   . Peripheral vascular disease ([place])   . Pneumonia   . Prostate cancer ([place])   . Squamous cell carcinoma of skin 08/10/2018   Left upper arm above elbow. [facility].  Marland Kitchen Squamous cell carcinoma of skin 09/07/2018   Right forearm, below elbow. [facility]. [facility] 01/10/2019.  Marland Kitchen Stroke [facility])      Past Surgical History: Past Surgical History:  Procedure Laterality Date  . COLONOSCOPY    . CORONARY ARTERY BYPASS GRAFT  2006  .  EYE SURGERY    . GOLD SEED IMPLANT N/A 12/30/2016   Procedure: GOLD SEED IMPLANT  x3;  Surgeon: Hollice Espy, MD;  Location: [facility] ORS;  Service: Urology;  Laterality: N/A;  . HEMORRHOID SURGERY N/A 10/02/2016   Procedure: PROCTOSCOPY AND CONTROL OF RECTAL BLEEDING;  Surgeon: Excell Seltzer, MD;  Location: [facility] ORS;  Service: General;  Laterality: N/A;  . PROSTATE BIOPSY    . TONSILLECTOMY       Family History: Family History  Problem Relation Age of Onset  . Lung cancer Mother   . Heart attack Father   . Hypertension Other   . Cancer Maternal Grandfather        prostate  . Cancer Paternal Grandmother        breast     Social History: Social History   Socioeconomic History  . Marital status: Married    Spouse name: Not on file  . Number of children: Not on  file  . Years of education: Not on file  . Highest education level: Not on file  Occupational History  . Not on file  Tobacco Use  . Smoking status: Current Every Day Smoker    Packs/day: 1.00    Years: 54.00    Pack years: 54.00    Types: Cigarettes  . Smokeless tobacco: Never Used  Vaping Use  . Vaping Use: Never used  Substance and Sexual Activity  . Alcohol use: No    Alcohol/week: 0.0 standard drinks  . Drug use: No  . Sexual activity: Yes    Birth control/protection: None    Comment: Married  Other Topics Concern  . Not on file  Social History Narrative  . Not on file   Social Determinants of Health   Financial Resource Strain: Not on file  Food Insecurity: Not on file  Transportation Needs: Not on file  Physical Activity: Not on file  Stress: Not on file  Social Connections: Not on file  Intimate Partner Violence: Not on file     Review of Systems: Review of Systems  Constitutional: Positive for malaise/fatigue and weight loss.  HENT: Negative.   Eyes: Negative.   Respiratory: Negative for cough, hemoptysis, sputum production, shortness of breath and wheezing.   Cardiovascular: Negative for chest pain, palpitations, orthopnea, claudication, leg swelling and PND.  Gastrointestinal: Negative.   Genitourinary: Negative for dysuria, flank pain, frequency, hematuria and urgency.  Musculoskeletal: Negative for back pain, falls, joint pain, myalgias and neck pain.  Skin: Negative.   Neurological: Positive for weakness.  Endo/Heme/Allergies: Negative.   Psychiatric/Behavioral: Negative.     Vital Signs: Blood pressure (!) 103/50, pulse (!) 57, temperature (!) 97.5 F (36.4 C), resp. rate 18, height 5\' 6"  (1.676 m), weight 74 kg, SpO2 97 %.  Weight trends: Filed Weights   02/19/21 0836  Weight: 74 kg    Physical Exam: General: NAD, ill appearing, laying in bed  Head: Normocephalic, atraumatic. Moist oral mucosal membranes  Eyes: Anicteric, PERRL   Neck: Supple, trachea midline  Lungs:  Clear to auscultation  Heart: irregular  Abdomen:  Soft, nontender  Extremities: + peripheral edema.  Neurologic: Nonfocal, moving all four extremities  Skin: + induration and erythema bilateral lower extremities  Access: none     Lab results: Basic Metabolic Panel: Recent Labs  Lab 02/19/21 0849 02/20/21 0454 02/20/21 0755  NA 133*  --  133*  K 4.6  --  5.8*  CL 103  --  100  CO2 20*  --  23  GLUCOSE 48*  --  74  BUN 70*  --  68*  CREATININE 3.95* 3.63* 3.65*  CALCIUM 7.5*  --  7.9*    Liver Function Tests: Recent Labs  Lab 02/19/21 0849  AST 15  ALT 12  ALKPHOS 102  BILITOT 0.7  PROT 5.6*  ALBUMIN 2.8*   No results for input(s): LIPASE, AMYLASE in the last 168 hours. No results for input(s): AMMONIA in the last 168 hours.  CBC: Recent Labs  Lab 02/19/21 0849 02/20/21 0755  WBC 10.0 8.1  NEUTROABS 8.6* 6.3  HGB 8.1* 7.7*  HCT 26.2* 24.7*  MCV 78.0* 76.5*  PLT 276 266    Cardiac Enzymes: Recent Labs  Lab 02/19/21 1056  CKTOTAL 34*    BNP: Invalid input(s): POCBNP  CBG: Recent Labs  Lab 02/19/21 2056 02/19/21 2114 02/19/21 2221 02/20/21 0135 02/20/21 0803  GLUCAP 68* 96 123* 123* 74    Microbiology: Results for orders placed or performed during the hospital encounter of 02/19/21  Resp Panel by RT-PCR (Flu A&B, Covid) Nasopharyngeal Swab     Status: None   Collection Time: 02/19/21  9:56 AM   Specimen: Nasopharyngeal Swab; Nasopharyngeal(NP) swabs in vial transport medium  Result Value Ref Range Status   SARS Coronavirus 2 by RT PCR NEGATIVE NEGATIVE Final    Comment: (NOTE) SARS-CoV-2 target nucleic acids are NOT DETECTED.  The SARS-CoV-2 RNA is generally detectable in upper respiratory specimens during the acute phase of infection. The lowest concentration of SARS-CoV-2 viral copies this assay can detect is 138 copies/mL. A negative result does not preclude SARS-Cov-2 infection and  should not be used as the sole basis for treatment or other patient management decisions. A negative result may occur with  improper specimen collection/handling, submission of specimen other than nasopharyngeal swab, presence of viral mutation(s) within the areas targeted by this assay, and inadequate number of viral copies(<138 copies/mL). A negative result must be combined with clinical observations, patient history, and epidemiological information. The expected result is Negative.  Fact Sheet for Patients:  EntrepreneurPulse.com.au  Fact Sheet for Healthcare Providers:  IncredibleEmployment.be  This test is no t yet approved or cleared by the [place] FDA and  has been authorized for detection and/or diagnosis of SARS-CoV-2 by FDA under an Emergency Use Authorization (EUA). This EUA will remain  in effect (meaning this test can be used) for the duration of the COVID-19 declaration under Section 564(b)(1) of the Act, 21 U.S.C.section 360bbb-3(b)(1), unless the authorization is terminated  or revoked sooner.       Influenza A by PCR NEGATIVE NEGATIVE Final   Influenza B by PCR NEGATIVE NEGATIVE Final    Comment: (NOTE) The Xpert Xpress SARS-CoV-2/FLU/RSV plus assay is intended as an aid in the diagnosis of influenza from Nasopharyngeal swab specimens and should not be used as a sole basis for treatment. Nasal washings and aspirates are unacceptable for Xpert Xpress SARS-CoV-2/FLU/RSV testing.  Fact Sheet for Patients: EntrepreneurPulse.com.au  Fact Sheet for Healthcare Providers: IncredibleEmployment.be  This test is not yet approved or cleared by the [place] FDA and has been authorized for detection and/or diagnosis of SARS-CoV-2 by FDA under an Emergency Use Authorization (EUA). This EUA will remain in effect (meaning this test can be used) for the duration of the COVID-19 declaration  under Section 564(b)(1) of the Act, 21 U.S.C. section 360bbb-3(b)(1), unless the authorization is terminated or revoked.  Performed at [facility], [address], [place],  [address]  Coagulation Studies: No results for input(s): LABPROT, INR in the last 72 hours.  Urinalysis: No results for input(s): COLORURINE, LABSPEC, PHURINE, GLUCOSEU, HGBUR, BILIRUBINUR, KETONESUR, PROTEINUR, UROBILINOGEN, NITRITE, LEUKOCYTESUR in the last 72 hours.  Invalid input(s): APPERANCEUR    Imaging: DG Chest 2 View  Result Date: 02/19/2021 CLINICAL DATA:  Weakness. EXAM: CHEST - 2 VIEW COMPARISON:  02/02/2021 FINDINGS: Midline trachea. Moderate cardiomegaly. Trace left pleural fluid or thickening. No pneumothorax. Interstitial thickening is decreased compared to the prior exam. No lobar consolidation. IMPRESSION: Cardiomegaly with decreased pulmonary interstitial thickening. Likely related to COPD/chronic bronchitis. No overt congestive failure. Similar trace left pleural fluid or thickening. Electronically Signed   By: Abigail Miyamoto M.D.   On: 02/19/2021 09:51   CT Head Wo Contrast  Result Date: 02/19/2021 CLINICAL DATA:  Neck trauma. Generalized weakness for 2-3 days. History of stroke. EXAM: CT HEAD WITHOUT CONTRAST CT CERVICAL SPINE WITHOUT CONTRAST TECHNIQUE: Multidetector CT imaging of the head and cervical spine was performed following the standard protocol without intravenous contrast. Multiplanar CT image reconstructions of the cervical spine were also generated. COMPARISON:  MRI brain 08/08/2016 FINDINGS: CT HEAD FINDINGS Brain: No evidence of acute infarction, hemorrhage, hydrocephalus, extra-axial collection or mass lesion/mass effect. There is mild diffuse low-attenuation within the subcortical and periventricular white matter compatible with chronic microvascular disease. Remote lacunar infarct in the left cerebellar hemisphere identified. Prominence of the sulci and  ventricles compatible with brain atrophy. Vascular: No hyperdense vessel or unexpected calcification. Skull: Normal. Negative for fracture or focal lesion. Sinuses/Orbits: Paranasal sinuses appear clear. Mastoid air cells are clear. Other: There is a lucent bone lesion with pathologic fracture involving the maxilla measuring approximately 2.3 x 1.2 cm, image 9/3. This is only partially visualized. CT CERVICAL SPINE FINDINGS Alignment: Normal Skull base and vertebrae: The vertebral body heights are well maintained. No fractures identified. Soft tissues and spinal canal: No prevertebral fluid or swelling. No visible canal hematoma. Disc levels: There is partial fusion of C4-5. Marked disc space narrowing, endplate sclerosis and ventral and dorsal osteophyte formation identified at C6-7. Upper chest: Pleural thickening versus small effusion noted within the posterior right chest. Other: None IMPRESSION: 1. No acute intracranial abnormality. 2. 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 3. No evidence for cervical spine fracture. 4. Marked degenerative disc disease identified C6-7 5. Expansile lucent lesion with pathologic fracture is identified involving the midline the maxilla. Recommend further evaluation with dedicated maxillofacial CT with contrast. Electronically Signed   By: Kerby Moors M.D.   On: 02/19/2021 09:38   CT Cervical Spine Wo Contrast  Result Date: 02/19/2021 CLINICAL DATA:  Neck trauma. Generalized weakness for 2-3 days. History of stroke. EXAM: CT HEAD WITHOUT CONTRAST CT CERVICAL SPINE WITHOUT CONTRAST TECHNIQUE: Multidetector CT imaging of the head and cervical spine was performed following the standard protocol without intravenous contrast. Multiplanar CT image reconstructions of the cervical spine were also generated. COMPARISON:  MRI brain 08/08/2016 FINDINGS: CT HEAD FINDINGS Brain: No evidence of acute infarction, hemorrhage, hydrocephalus, extra-axial collection or mass  lesion/mass effect. There is mild diffuse low-attenuation within the subcortical and periventricular white matter compatible with chronic microvascular disease. Remote lacunar infarct in the left cerebellar hemisphere identified. Prominence of the sulci and ventricles compatible with brain atrophy. Vascular: No hyperdense vessel or unexpected calcification. Skull: Normal. Negative for fracture or focal lesion. Sinuses/Orbits: Paranasal sinuses appear clear. Mastoid air cells are clear. Other: There is a lucent bone lesion with pathologic fracture involving the maxilla measuring  approximately 2.3 x 1.2 cm, image 9/3. This is only partially visualized. CT CERVICAL SPINE FINDINGS Alignment: Normal Skull base and vertebrae: The vertebral body heights are well maintained. No fractures identified. Soft tissues and spinal canal: No prevertebral fluid or swelling. No visible canal hematoma. Disc levels: There is partial fusion of C4-5. Marked disc space narrowing, endplate sclerosis and ventral and dorsal osteophyte formation identified at C6-7. Upper chest: Pleural thickening versus small effusion noted within the posterior right chest. Other: None IMPRESSION: 1. No acute intracranial abnormality. 2. 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 3. No evidence for cervical spine fracture. 4. Marked degenerative disc disease identified C6-7 5. Expansile lucent lesion with pathologic fracture is identified involving the midline the maxilla. Recommend further evaluation with dedicated maxillofacial CT with contrast. Electronically Signed   By: Kerby Moors M.D.   On: 02/19/2021 09:38      Assessment & Plan: Mr. Kendon Sedeno is a 75 y.o. 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 Acute kidney injury with hyperkalemia on chronic kidney disease stage IIIB: with history of proteinuria.  Baseline creatinine of 1.9, GFR of 35.  Chronic Kidney Disease needs work up.  Acute kidney injury thought to be secondary to overdiuresis.  - holding potassium chloride, torsemide and losartan - Check hep status, SPEP/UPEP, urine studies, and renal ultrasound.  - Lokelma  2. Hyponatremia: secondary to acute kidney injury  3. Hypertension with congestive heart failure with preserved ejection fraction on echocardiogram on 11/15/2020 and chronic atrial fibrillation.  - continue to hold blood pressure agents.  - IV albumin 25g x 1  4. Anemia with kidney failure and iron deficiency: hemoglobin 7.7. microcytic.    LOS: 1 Leontina Skidmore 4/20/202210:21 AM

## 2021-02-20 NOTE — Progress Notes (Signed)
Pulmonary Individual Treatment Plan  Patient Details  Name: [name] MRN: [id number] Date of Birth: 06-12-1946 Referring Provider:   Flowsheet Row Pulmonary Rehab from 01/17/2021 in [facility] Cardiac and Pulmonary Rehab  Referring Provider Bartholome Bill MD      Initial Encounter Date:  Flowsheet Row Pulmonary Rehab from 01/17/2021 in [facility] Cardiac and Pulmonary Rehab  Date 01/17/21      Visit Diagnosis: Heart failure, chronic systolic ([place])  Patient's Home Medications on Admission: No current facility-administered medications for this visit. No current outpatient medications on file.  Facility-Administered Medications Ordered in Other Visits:  .  0.9 %  sodium chloride infusion, , Intravenous, Continuous, Agbata, Tochukwu, MD, Last Rate: 100 mL/hr at 02/19/21 1945, New Bag at 02/19/21 1945 .  acetaminophen (TYLENOL) tablet 650 mg, 650 mg, Oral, Q6H PRN, Agbata, Tochukwu, MD .  acidophilus (RISAQUAD) capsule 1 capsule, 1 capsule, Oral, Daily, Agbata, Tochukwu, MD, 1 capsule at 02/19/21 1325 .  albuterol (VENTOLIN HFA) 108 (90 Base) MCG/ACT inhaler 2 puff, 2 puff, Inhalation, Q6H PRN, Agbata, Tochukwu, MD .  Ampicillin-Sulbactam (UNASYN) 3 g in sodium chloride 0.9 % 100 mL IVPB, 3 g, Intravenous, Q12H, Darnelle Bos, RPH, Last Rate: 200 mL/hr at 02/20/21 0245, 3 g at 02/20/21 0245 .  apixaban (ELIQUIS) tablet 5 mg, 5 mg, Oral, BID, Agbata, Tochukwu, MD, 5 mg at 02/19/21 2322 .  cholecalciferol (VITAMIN D3) tablet 1,000 Units, 1,000 Units, Oral, Daily, Agbata, Tochukwu, MD, 1,000 Units at 02/19/21 1323 .  gabapentin (NEURONTIN) capsule 100 mg, 100 mg, Oral, BID, Agbata, Tochukwu, MD, 100 mg at 02/19/21 2322 .  insulin aspart (novoLOG) injection 0-15 Units, 0-15 Units, Subcutaneous, TID WC, Agbata, Tochukwu, MD .  Ipratropium-Albuterol (COMBIVENT) respimat 2 puff, 2 puff, Inhalation, Q6H PRN, Agbata, Tochukwu, MD .  iron polysaccharides (NIFEREX) capsule 150 mg, 150 mg, Oral, Daily, Agbata,  Tochukwu, MD, 150 mg at 02/19/21 1322 .  magnesium oxide (MAG-OX) tablet 400 mg, 400 mg, Oral, Daily, Agbata, Tochukwu, MD, 400 mg at 02/19/21 1329 .  multivitamin-lutein (OCUVITE-LUTEIN) capsule 1 capsule, 1 capsule, Oral, Daily, Agbata, Tochukwu, MD, 1 capsule at 02/19/21 1323 .  nicotine (NICODERM CQ - dosed in mg/24 hours) patch 21 mg, 21 mg, Transdermal, Daily, Agbata, Tochukwu, MD .  ondansetron (ZOFRAN) tablet 4 mg, 4 mg, Oral, Q6H PRN **OR** ondansetron (ZOFRAN) injection 4 mg, 4 mg, Intravenous, Q6H PRN, Agbata, Tochukwu, MD .  pantoprazole (PROTONIX) EC tablet 40 mg, 40 mg, Oral, Daily, Agbata, Tochukwu, MD, 40 mg at 02/19/21 1324 .  protein supplement (ENSURE MAX) liquid, 11 oz, Oral, BID BM, Agbata, Tochukwu, MD .  simvastatin (ZOCOR) tablet 20 mg, 20 mg, Oral, q AM, Agbata, Tochukwu, MD, 20 mg at 02/19/21 1323 .  tiotropium (SPIRIVA) inhalation capsule ([facility] use ONLY) 18 mcg, 18 mcg, Inhalation, Daily, Agbata, Tochukwu, MD, 18 mcg at 02/19/21 1319 .  traZODone (DESYREL) tablet 50 mg, 50 mg, Oral, QHS, Agbata, Tochukwu, MD, 50 mg at 02/19/21 2322  Past Medical History: Past Medical History:  Diagnosis Date  . Arrhythmia    atrial fibrillation  . Atrial fibrillation ([place])   . Basal cell carcinoma 05/2019   right nasal ala, Tx: EDC  . Basal cell carcinoma 04/12/2019   Left posterior ear. Nodular pattern, excoriated.   . CHF (congestive heart failure) ([place])   . COPD (chronic obstructive pulmonary disease) ([place])   . Coronary artery disease   . Diabetes mellitus without complication ([place])   . GERD (gastroesophageal reflux disease)   .  HLD (hyperlipidemia)   . Hypertension   . Peripheral vascular disease ([place])   . Pneumonia   . Prostate cancer ([place])   . Squamous cell carcinoma of skin 08/10/2018   Left upper arm above elbow. WD SCC.  Marland Kitchen Squamous cell carcinoma of skin 09/07/2018   Right forearm, below elbow. WD SCC. [facility] 01/10/2019.  Marland Kitchen Stroke [facility])     Tobacco Use: Social  History   Tobacco Use  Smoking Status Current Every Day Smoker  . Packs/day: 1.00  . Years: 54.00  . Pack years: 54.00  . Types: Cigarettes  Smokeless Tobacco Never Used    Labs: Recent Review Flowsheet Data    Labs for ITP Cardiac and Pulmonary Rehab Latest Ref Rng & Units 10/23/2020 10/24/2020 10/24/2020 01/28/2021 02/20/2021   Hemoglobin A1c 4.8 - 5.6 % - 7.3(H) 7.6(H) 6.8(H) -   PHART 7.350 - 7.450 - - - - -   PCO2ART 32.0 - 48.0 mmHg - - - - -   HCO3 20.0 - 28.0 mmol/L 34.4(H) - - 29.2(H) 22.5   TCO2 0 - 100 mmol/L - - - - -   ACIDBASEDEF 0.0 - 2.0 mmol/L - - - - 2.6(H)   O2SAT % 58.9 - - 45.7 80.2       Pulmonary Assessment Scores:  Pulmonary Assessment Scores    Row Name 01/17/21 1044         ADL UCSD   ADL Phase Entry     SOB Score total 85     Rest 2     Walk 3     Stairs 5     Bath 3     Dress 3     Shop 2           CAT Score   CAT Score 27           mMRC Score   mMRC Score 3            UCSD: Self-administered rating of dyspnea associated with activities of daily living (ADLs) 6-point scale (0 = "not at all" to 5 = "maximal or unable to do because of breathlessness")  Scoring Scores range from 0 to 120.  Minimally important difference is 5 units  CAT: CAT can identify the health impairment of COPD patients and is better correlated with disease progression.  CAT has a scoring range of zero to 40. The CAT score is classified into four groups of low (less than 10), medium (10 - 20), high (21-30) and very high (31-40) based on the impact level of disease on health status. A CAT score over 10 suggests significant symptoms.  A worsening CAT score could be explained by an exacerbation, poor medication adherence, poor inhaler technique, or progression of COPD or comorbid conditions.  CAT MCID is 2 points  mMRC: mMRC (Modified Medical Research Council) Dyspnea Scale is used to assess the degree of baseline functional disability in patients of respiratory  disease due to dyspnea. No minimal important difference is established. A decrease in score of 1 point or greater is considered a positive change.   Pulmonary Function Assessment:   Exercise Target Goals: Exercise Program Goal: Individual exercise prescription set using results from initial 6 min walk test and THRR while considering  patient's activity barriers and safety.   Exercise Prescription Goal: Initial exercise prescription builds to 30-45 minutes a day of aerobic activity, 2-3 days per week.  Home exercise guidelines will be given to patient during program as part of  exercise prescription that the participant will acknowledge.  Education: Aerobic Exercise: - Group verbal and visual presentation on the components of exercise prescription. Introduces F.I.T.T principle from ACSM for exercise prescriptions.  Reviews F.I.T.T. principles of aerobic exercise including progression. Written material given at graduation. Flowsheet Row Pulmonary Rehab from 01/24/2021 in [facility] Cardiac and Pulmonary Rehab  Education need identified 01/17/21      Education: Resistance Exercise: - Group verbal and visual presentation on the components of exercise prescription. Introduces F.I.T.T principle from ACSM for exercise prescriptions  Reviews F.I.T.T. principles of resistance exercise including progression. Written material given at graduation.    Education: Exercise & Equipment Safety: - Individual verbal instruction and demonstration of equipment use and safety with use of the equipment. Flowsheet Row Pulmonary Rehab from 01/24/2021 in [facility] Cardiac and Pulmonary Rehab  Date 01/17/21  Educator [facility]  Instruction Review Code 1- Verbalizes Understanding      Education: Exercise Physiology & General Exercise Guidelines: - Group verbal and written instruction with models to review the exercise physiology of the cardiovascular system and associated critical values. Provides general exercise guidelines with  specific guidelines to those with heart or lung disease.    Education: Flexibility, Balance, Mind/Body Relaxation: - Group verbal and visual presentation with interactive activity on the components of exercise prescription. Introduces F.I.T.T principle from ACSM for exercise prescriptions. Reviews F.I.T.T. principles of flexibility and balance exercise training including progression. Also discusses the mind body connection.  Reviews various relaxation techniques to help reduce and manage stress (i.e. Deep breathing, progressive muscle relaxation, and visualization). Balance handout provided to take home. Written material given at graduation.   Activity Barriers & Risk Stratification:  Activity Barriers & Cardiac Risk Stratification - 01/17/21 1036      Activity Barriers & Cardiac Risk Stratification   Activity Barriers Assistive Device;Balance Concerns;Shortness of Breath;Deconditioning;Muscular Weakness;History of Falls;Other (comment)    Comments residuals from stroke, double vision    Cardiac Risk Stratification Moderate           6 Minute Walk:  6 Minute Walk    Row Name 01/17/21 1034         6 Minute Walk   Phase --  NuStep Test     Distance 253 feet  steps     Walk Time 6 minutes     # of Rest Breaks 0     METS 1.1     RPE 13     Perceived Dyspnea  2     Symptoms Yes (comment)     Comments SOB, fatigue     Resting HR 65 bpm     Resting BP 154/64     Resting Oxygen Saturation  96 %     Exercise Oxygen Saturation  during 6 min walk 92 %     Max Ex. HR 72 bpm     Max Ex. BP 168/84     2 Minute Post BP 144/64           Interval HR   1 Minute HR 70     2 Minute HR 72     3 Minute HR 72     4 Minute HR 64     5 Minute HR 67     6 Minute HR 68     2 Minute Post HR 56     Interval Heart Rate? Yes           Interval Oxygen   Interval Oxygen? Yes     Baseline  Oxygen Saturation % 96 %     1 Minute Oxygen Saturation % 93 %     1 Minute Liters of Oxygen 0 L  Room  Air     2 Minute Oxygen Saturation % 92 %     2 Minute Liters of Oxygen 0 L     3 Minute Oxygen Saturation % 93 %     3 Minute Liters of Oxygen 0 L     4 Minute Oxygen Saturation % 98 %     4 Minute Liters of Oxygen 0 L     5 Minute Oxygen Saturation % 97 %     5 Minute Liters of Oxygen 0 L     6 Minute Oxygen Saturation % 97 %     6 Minute Liters of Oxygen 0 L     2 Minute Post Oxygen Saturation % 97 %     2 Minute Post Liters of Oxygen 0 L           Oxygen Initial Assessment:  Oxygen Initial Assessment - 01/07/21 1537      Home Oxygen   Home Oxygen Device None    Sleep Oxygen Prescription None    Home Exercise Oxygen Prescription None    Home Resting Oxygen Prescription None    Compliance with Home Oxygen Use Yes      Intervention   Short Term Goals To learn and demonstrate proper use of respiratory medications;To learn and demonstrate proper pursed lip breathing techniques or other breathing techniques.    Long  Term Goals Exhibits proper breathing techniques, such as pursed lip breathing or other method taught during program session;Compliance with respiratory medication           Oxygen Re-Evaluation:  Oxygen Re-Evaluation    Row Name 01/22/21 1115             Home Oxygen   Home Oxygen Device None       Sleep Oxygen Prescription None       Home Exercise Oxygen Prescription None       Home Resting Oxygen Prescription None               Goals/Expected Outcomes   Short Term Goals To learn and demonstrate proper pursed lip breathing techniques or other breathing techniques.;To learn and understand importance of monitoring SPO2 with pulse oximeter and demonstrate accurate use of the pulse oximeter.;To learn and understand importance of maintaining oxygen saturations>88%       Long  Term Goals Exhibits proper breathing techniques, such as pursed lip breathing or other method taught during program session;Verbalizes importance of monitoring SPO2 with pulse oximeter  and return demonstration;Maintenance of O2 saturations>88%       Comments Reviewed PLB technique with pt.  Talked about how it works and it's importance in maintaining their exercise saturations.       Goals/Expected Outcomes Short: Become more profiecient at using PLB.   Long: Become independent at using PLB.              Oxygen Discharge (Final Oxygen Re-Evaluation):  Oxygen Re-Evaluation - 01/22/21 1115      Home Oxygen   Home Oxygen Device None    Sleep Oxygen Prescription None    Home Exercise Oxygen Prescription None    Home Resting Oxygen Prescription None      Goals/Expected Outcomes   Short Term Goals To learn and demonstrate proper pursed lip breathing techniques or other breathing techniques.;To learn and understand importance  of monitoring SPO2 with pulse oximeter and demonstrate accurate use of the pulse oximeter.;To learn and understand importance of maintaining oxygen saturations>88%    Long  Term Goals Exhibits proper breathing techniques, such as pursed lip breathing or other method taught during program session;Verbalizes importance of monitoring SPO2 with pulse oximeter and return demonstration;Maintenance of O2 saturations>88%    Comments Reviewed PLB technique with pt.  Talked about how it works and it's importance in maintaining their exercise saturations.    Goals/Expected Outcomes Short: Become more profiecient at using PLB.   Long: Become independent at using PLB.           Initial Exercise Prescription:  Initial Exercise Prescription - 01/17/21 1000      Date of Initial Exercise RX and Referring Provider   Date 01/17/21    Referring Provider Bartholome Bill MD      Recumbant Bike   Level 1    RPM 60    Minutes 15    METs 1.2      NuStep   Level 1    SPM 60    Minutes 15    METs 1.2      Arm Ergometer   Level 1    RPM 25    Minutes 15    METs 1      Prescription Details   Frequency (times per week) 2    Duration Progress to 30 minutes of  continuous aerobic without signs/symptoms of physical distress      Intensity   THRR 40-80% of Max Heartrate 97-129    Ratings of Perceived Exertion 11-13    Perceived Dyspnea 0-4      Progression   Progression Continue to progress workloads to maintain intensity without signs/symptoms of physical distress.      Resistance Training   Training Prescription Yes    Weight 3 lb    Reps 10-15           Perform Capillary Blood Glucose checks as needed.  Exercise Prescription Changes:  Exercise Prescription Changes    Row Name 01/17/21 1000 01/22/21 1300 02/05/21 1600         Response to Exercise   Blood Pressure (Admit) 154/64 116/46 80/46     Blood Pressure (Exercise) 168/84 100/60 88/52     Blood Pressure (Exit) 124/64 110/54 84/60     Heart Rate (Admit) 65 bpm 70 bpm 86 bpm     Heart Rate (Exercise) 72 bpm 76 bpm 60 bpm     Heart Rate (Exit) 65 bpm 71 bpm 56 bpm     Oxygen Saturation (Admit) 96 % 94 % 95 %     Oxygen Saturation (Exercise) 92 % 92 % 92 %     Oxygen Saturation (Exit) 95 % -- 91 %     Rating of Perceived Exertion (Exercise) 13 11 13      Perceived Dyspnea (Exercise) 2 1 2      Symptoms SOB, fatigue -- --     Comments NuStep test results first day second full day     Duration -- Progress to 30 minutes of  aerobic without signs/symptoms of physical distress Continue with 30 min of aerobic exercise without signs/symptoms of physical distress.     Intensity -- THRR unchanged THRR unchanged           Progression   Progression -- Continue to progress workloads to maintain intensity without signs/symptoms of physical distress. Continue to progress workloads to maintain intensity without signs/symptoms of physical  distress.     Average METs -- 1.2 2           Resistance Training   Training Prescription -- Yes Yes     Weight -- 3 lb 3 lb     Reps -- 10-15 10-15           Interval Training   Interval Training -- -- No           Recumbant Bike   Level -- --  1     Minutes -- -- 15     METs -- -- 2           NuStep   Level -- 1 --     Minutes -- 15 --     METs -- 1.2 --            Exercise Comments:  Exercise Comments    Row Name 01/22/21 1114           Exercise Comments First full day of exercise!  Patient was oriented to gym and equipment including functions, settings, policies, and procedures.  Patient's individual exercise prescription and treatment plan were reviewed.  All starting workloads were established based on the results of the 6 minute walk test done at initial orientation visit.  The plan for exercise progression was also introduced and progression will be customized based on patient's performance and goals.              Exercise Goals and Review:  Exercise Goals    Row Name 01/17/21 1040             Exercise Goals   Increase Physical Activity Yes       Intervention Provide advice, education, support and counseling about physical activity/exercise needs.;Develop an individualized exercise prescription for aerobic and resistive training based on initial evaluation findings, risk stratification, comorbidities and participant's personal goals.       Expected Outcomes Short Term: Attend rehab on a regular basis to increase amount of physical activity.;Long Term: Add in home exercise to make exercise part of routine and to increase amount of physical activity.;Long Term: Exercising regularly at least 3-5 days a week.       Increase Strength and Stamina Yes       Intervention Provide advice, education, support and counseling about physical activity/exercise needs.;Develop an individualized exercise prescription for aerobic and resistive training based on initial evaluation findings, risk stratification, comorbidities and participant's personal goals.       Expected Outcomes Short Term: Perform resistance training exercises routinely during rehab and add in resistance training at home;Long Term: Improve cardiorespiratory  fitness, muscular endurance and strength as measured by increased METs and functional capacity (6MWT);Short Term: Increase workloads from initial exercise prescription for resistance, speed, and METs.       Able to understand and use rate of perceived exertion (RPE) scale Yes       Intervention Provide education and explanation on how to use RPE scale       Expected Outcomes Short Term: Able to use RPE daily in rehab to express subjective intensity level;Long Term:  Able to use RPE to guide intensity level when exercising independently       Able to understand and use Dyspnea scale Yes       Intervention Provide education and explanation on how to use Dyspnea scale       Expected Outcomes Short Term: Able to use Dyspnea scale daily in rehab to express subjective sense of  shortness of breath during exertion;Long Term: Able to use Dyspnea scale to guide intensity level when exercising independently       Knowledge and understanding of Target Heart Rate Range (THRR) Yes       Intervention Provide education and explanation of THRR including how the numbers were predicted and where they are located for reference       Expected Outcomes Short Term: Able to state/look up THRR;Short Term: Able to use daily as guideline for intensity in rehab;Long Term: Able to use THRR to govern intensity when exercising independently       Able to check pulse independently Yes       Intervention Provide education and demonstration on how to check pulse in carotid and radial arteries.;Review the importance of being able to check your own pulse for safety during independent exercise       Expected Outcomes Short Term: Able to explain why pulse checking is important during independent exercise;Long Term: Able to check pulse independently and accurately       Understanding of Exercise Prescription Yes       Intervention Provide education, explanation, and written materials on patient's individual exercise prescription        Expected Outcomes Short Term: Able to explain program exercise prescription;Long Term: Able to explain home exercise prescription to exercise independently              Exercise Goals Re-Evaluation :  Exercise Goals Re-Evaluation    [place] Name 01/22/21 1114 02/05/21 1630           Exercise Goal Re-Evaluation   Exercise Goals Review Increase Physical Activity;Able to understand and use rate of perceived exertion (RPE) scale;Knowledge and understanding of Target Heart Rate Range (THRR);Understanding of Exercise Prescription;Increase Strength and Stamina;Able to understand and use Dyspnea scale;Able to check pulse independently Increase Physical Activity;Increase Strength and Stamina;Understanding of Exercise Prescription      Comments Reviewed RPE and dyspnea scales, THR and program prescription with pt today.  Pt voiced understanding and was given a copy of goals to take home. Alexandro has completed his first two full days of exercise.  He is currently admitted for respiratory failure.  Will need clearance to return to rehab.      Expected Outcomes Short: Use RPE daily to regulate intensity. Long: Follow program prescription in THR. Short: Clearance to return Long: Continue to follow program prescription.             Discharge Exercise Prescription (Final Exercise Prescription Changes):  Exercise Prescription Changes - 02/05/21 1600      Response to Exercise   Blood Pressure (Admit) 80/46    Blood Pressure (Exercise) 88/52    Blood Pressure (Exit) 84/60    Heart Rate (Admit) 86 bpm    Heart Rate (Exercise) 60 bpm    Heart Rate (Exit) 56 bpm    Oxygen Saturation (Admit) 95 %    Oxygen Saturation (Exercise) 92 %    Oxygen Saturation (Exit) 91 %    Rating of Perceived Exertion (Exercise) 13    Perceived Dyspnea (Exercise) 2    Comments second full day    Duration Continue with 30 min of aerobic exercise without signs/symptoms of physical distress.    Intensity THRR unchanged       Progression   Progression Continue to progress workloads to maintain intensity without signs/symptoms of physical distress.    Average METs 2      Resistance Training   Training Prescription Yes  Weight 3 lb    Reps 10-15      Interval Training   Interval Training No      Recumbant Bike   Level 1    Minutes 15    METs 2           Nutrition:  Target Goals: Understanding of nutrition guidelines, daily intake of sodium 1500mg , cholesterol 200mg , calories 30% from fat and 7% or less from saturated fats, daily to have 5 or more servings of fruits and vegetables.  Education: All About Nutrition: -Group instruction provided by verbal, written material, interactive activities, discussions, models, and posters to present general guidelines for heart healthy nutrition including fat, fiber, MyPlate, the role of sodium in heart healthy nutrition, utilization of the nutrition label, and utilization of this knowledge for meal planning. Follow up email sent as well. Written material given at graduation. Flowsheet Row Pulmonary Rehab from 01/24/2021 in [facility] Cardiac and Pulmonary Rehab  Education need identified 01/17/21      Biometrics:  Pre Biometrics - 01/17/21 1040      Pre Biometrics   Height 5\' 6"  (1.676 m)    Weight 160 lb 4.8 oz (72.7 kg)    BMI (Calculated) 25.89            Nutrition Therapy Plan and Nutrition Goals:   Nutrition Assessments:  MEDIFICTS Score Key:  ?70 Need to make dietary changes   40-70 Heart Healthy Diet  ? 40 Therapeutic Level Cholesterol Diet  Flowsheet Row Pulmonary Rehab from 01/17/2021 in [facility] Cardiac and Pulmonary Rehab  Picture Your Plate Total Score on Admission 70     Picture Your Plate Scores:  <78 Unhealthy dietary pattern with much room for improvement.  41-50 Dietary pattern unlikely to meet recommendations for good health and room for improvement.  51-60 More healthful dietary pattern, with some room for improvement.    >60 Healthy dietary pattern, although there may be some specific behaviors that could be improved.   Nutrition Goals Re-Evaluation:   Nutrition Goals Discharge (Final Nutrition Goals Re-Evaluation):   Psychosocial: Target Goals: Acknowledge presence or absence of significant depression and/or stress, maximize coping skills, provide positive support system. Participant is able to verbalize types and ability to use techniques and skills needed for reducing stress and depression.   Education: Stress, Anxiety, and Depression - Group verbal and visual presentation to define topics covered.  Reviews how body is impacted by stress, anxiety, and depression.  Also discusses healthy ways to reduce stress and to treat/manage anxiety and depression.  Written material given at graduation.   Education: Sleep Hygiene -Provides group verbal and written instruction about how sleep can affect your health.  Define sleep hygiene, discuss sleep cycles and impact of sleep habits. Review good sleep hygiene tips.    Initial Review & Psychosocial Screening:  Initial Psych Review & Screening - 01/07/21 1538      Initial Review   Current issues with None Identified      Family Dynamics   Good Support System? Yes   wife     Barriers   Psychosocial barriers to participate in program There are no identifiable barriers or psychosocial needs.      Screening Interventions   Interventions Encouraged to exercise;To provide support and resources with identified psychosocial needs;Provide feedback about the scores to participant    Expected Outcomes Short Term goal: Utilizing psychosocial counselor, staff and physician to assist with identification of specific Stressors or current issues interfering with healing process. Setting desired  goal for each stressor or current issue identified.;Long Term Goal: Stressors or current issues are controlled or eliminated.;Short Term goal: Identification and review with  participant of any Quality of Life or Depression concerns found by scoring the questionnaire.;Long Term goal: The participant improves quality of Life and PHQ9 Scores as seen by post scores and/or verbalization of changes           Quality of Life Scores:  Quality of Life - 01/17/21 1040      Quality of Life   Select Quality of Life      Quality of Life Scores   Health/Function Pre 15.23 %    Socioeconomic Pre 23.25 %    Psych/Spiritual Pre 22.36 %    Family Pre 24 %    GLOBAL Pre 19.39 %          Scores of 19 and below usually indicate a poorer quality of life in these areas.  A difference of  2-3 points is a clinically meaningful difference.  A difference of 2-3 points in the total score of the Quality of Life Index has been associated with significant improvement in overall quality of life, self-image, physical symptoms, and general health in studies assessing change in quality of life.  PHQ-9: Recent Review Flowsheet Data    Depression screen [facility] 2/9 01/17/2021 08/26/2017 11/24/2016 01/09/2016 12/12/2015   Decreased Interest 0 0 0 0 0   Down, Depressed, Hopeless 0 0 0 0 0   PHQ - 2 Score 0 0 0 0 0   Altered sleeping 3 - - - -   Tired, decreased energy 3 - - - -   Change in appetite 0 - - - -   Feeling bad or failure about yourself  1 - - - -   Trouble concentrating 1 - - - -   Moving slowly or fidgety/restless 0 - - - -   Suicidal thoughts 0 - - - -   PHQ-9 Score 8 - - - -   Difficult doing work/chores Very difficult - - - -     Interpretation of Total Score  Total Score Depression Severity:  1-4 = Minimal depression, 5-9 = Mild depression, 10-14 = Moderate depression, 15-19 = Moderately severe depression, 20-27 = Severe depression   Psychosocial Evaluation and Intervention:  Psychosocial Evaluation - 01/07/21 1555      Psychosocial Evaluation & Interventions   Comments Domonik has no barriers to attending the program. He is ready to attend and hopes to see improved  shortness of breath from the program. He lives with his wife, his only support, and their 2 cats. 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 He stated no issues with depression, stress nor anxiety.  He does depend on his wife for transportation after several mini strokes caused imbalance and some eye concerns.  He is ready to get started and should do well.           Psychosocial Re-Evaluation:   Psychosocial Discharge (Final Psychosocial Re-Evaluation):   Education: Education Goals: Education classes will be provided on a weekly basis, covering required topics. Participant will state understanding/return demonstration of topics presented.  Learning Barriers/Preferences:   General Pulmonary Education Topics:  Infection Prevention: - Provides verbal and written material to individual with discussion of infection control including proper hand washing and proper equipment cleaning during exercise session. Flowsheet Row Pulmonary Rehab from 01/24/2021 in [facility] Cardiac and Pulmonary Rehab  Date 01/17/21  Educator  [facility]  Instruction Review Code 1- Verbalizes Understanding      Falls Prevention: - Provides verbal and written material to individual with discussion of falls prevention and safety. Flowsheet Row Pulmonary Rehab from 01/24/2021 in [facility] Cardiac and Pulmonary Rehab  Date 01/17/21  Educator [facility]  Instruction Review Code 1- Verbalizes Understanding      Chronic Lung Disease Review: - Group verbal instruction with posters, models, PowerPoint presentations and videos,  to review new updates, new respiratory medications, new advancements in procedures and treatments. Providing information on websites and "800" numbers for continued self-education. Includes information about supplement oxygen, available portable oxygen systems, continuous and intermittent flow rates, oxygen safety, concentrators, and Medicare reimbursement for oxygen. Explanation of  Pulmonary Drugs, including class, frequency, complications, importance of spacers, rinsing mouth after steroid MDI's, and proper cleaning methods for nebulizers. Review of basic lung anatomy and physiology related to function, structure, and complications of lung disease. Review of risk factors. Discussion about methods for diagnosing sleep apnea and types of masks and machines for OSA. Includes a review of the use of types of environmental controls: home humidity, furnaces, filters, dust mite/pet prevention, HEPA vacuums. Discussion about weather changes, air quality and the benefits of nasal washing. Instruction on Warning signs, infection symptoms, calling MD promptly, preventive modes, and value of vaccinations. Review of effective airway clearance, coughing and/or vibration techniques. Emphasizing that all should Create an Action Plan. Written material given at graduation.   AED/CPR: - Group verbal and written instruction with the use of models to demonstrate the basic use of the AED with the basic ABC's of resuscitation.    Anatomy and Cardiac Procedures: - Group verbal and visual presentation and models provide information about basic cardiac anatomy and function. Reviews the testing methods done to diagnose heart disease and the outcomes of the test results. Describes the treatment choices: Medical Management, Angioplasty, or Coronary Bypass Surgery for treating various heart conditions including Myocardial Infarction, Angina, Valve Disease, and Cardiac Arrhythmias.  Written material given at graduation. Flowsheet Row Pulmonary Rehab from 01/24/2021 in [facility] Cardiac and Pulmonary Rehab  Education need identified 01/17/21      Medication Safety: - Group verbal and visual instruction to review commonly prescribed medications for heart and lung disease. Reviews the medication, class of the drug, and side effects. Includes the steps to properly store meds and maintain the prescription regimen.   Written material given at graduation.   Other: -Provides group and verbal instruction on various topics (see comments)   Knowledge Questionnaire Score:  Knowledge Questionnaire Score - 01/17/21 1042      Knowledge Questionnaire Score   Pre Score 23/26 Education Focus: nurtrition, exercise, angina            Core Components/Risk Factors/Patient Goals at Admission:  Personal Goals and Risk Factors at Admission - 01/17/21 1042      Core Components/Risk Factors/Patient Goals on Admission    Weight Management Yes;Weight Loss    Intervention Weight Management: Develop a combined nutrition and exercise program designed to reach desired caloric intake, while maintaining appropriate intake of nutrient and fiber, sodium and fats, and appropriate energy expenditure required for the weight goal.;Weight Management: Provide education and appropriate resources to help participant work on and attain dietary goals.;Weight Management/Obesity: Establish reasonable short term and long term weight goals.    Admit Weight 160 lb 4.8 oz (72.7 kg)    Goal Weight: Short Term 157 lb (71.2 kg)    Goal Weight: Long Term 155 lb (70.3  kg)    Expected Outcomes Short Term: Continue to assess and modify interventions until short term weight is achieved;Long Term: Adherence to nutrition and physical activity/exercise program aimed toward attainment of established weight goal;Weight Maintenance: Understanding of the daily nutrition guidelines, which includes 25-35% calories from fat, 7% or less cal from saturated fats, less than 200mg  cholesterol, less than 1.5gm of sodium, & 5 or more servings of fruits and vegetables daily    Tobacco Cessation Yes    Number of packs per day Kaycee is a current tobacco user. Intervention for tobacco cessation was provided at the initial medical review. He was asked about readiness to quit and reported he is not ready to quit at this time . Patient was advised and educated about tobacco  cessation using combination therapy, tobacco cessation classes, quit line, and quit smoking apps. Patient demonstrated understanding of this material. Staff will continue to provide encouragement and follow up with the patient throughout the program.    Intervention Assist the participant in steps to quit. Provide individualized education and counseling about committing to Tobacco Cessation, relapse prevention, and pharmacological support that can be provided by physician.;Advice worker, assist with locating and accessing local/national Quit Smoking programs, and support quit date choice.    Expected Outcomes Short Term: Will demonstrate readiness to quit, by selecting a quit date.;Short Term: Will quit all tobacco product use, adhering to prevention of relapse plan.;Long Term: Complete abstinence from all tobacco products for at least 12 months from quit date.    Diabetes Yes    Intervention Provide education about signs/symptoms and action to take for hypo/hyperglycemia.;Provide education about proper nutrition, including hydration, and aerobic/resistive exercise prescription along with prescribed medications to achieve blood glucose in normal ranges: Fasting glucose 65-99 mg/dL    Expected Outcomes Short Term: Participant verbalizes understanding of the signs/symptoms and immediate care of hyper/hypoglycemia, proper foot care and importance of medication, aerobic/resistive exercise and nutrition plan for blood glucose control.;Long Term: Attainment of HbA1C < 7%.    Heart Failure Yes    Intervention Provide a combined exercise and nutrition program that is supplemented with education, support and counseling about heart failure. Directed toward relieving symptoms such as shortness of breath, decreased exercise tolerance, and extremity edema.    Expected Outcomes Improve functional capacity of life;Short term: Attendance in program 2-3 days a week with increased exercise capacity. Reported  lower sodium intake. Reported increased fruit and vegetable intake. Reports medication compliance.;Short term: Daily weights obtained and reported for increase. Utilizing diuretic protocols set by physician.;Long term: Adoption of self-care skills and reduction of barriers for early signs and symptoms recognition and intervention leading to self-care maintenance.    Hypertension Yes    Intervention Provide education on lifestyle modifcations including regular physical activity/exercise, weight management, moderate sodium restriction and increased consumption of fresh fruit, vegetables, and low fat dairy, alcohol moderation, and smoking cessation.;Monitor prescription use compliance.    Expected Outcomes Short Term: Continued assessment and intervention until BP is < 140/48mm HG in hypertensive participants. < 130/75mm HG in hypertensive participants with diabetes, heart failure or chronic kidney disease.;Long Term: Maintenance of blood pressure at goal levels.    Lipids Yes    Intervention Provide education and support for participant on nutrition & aerobic/resistive exercise along with prescribed medications to achieve LDL 70mg , HDL >40mg .    Expected Outcomes Short Term: Participant states understanding of desired cholesterol values and is compliant with medications prescribed. Participant is following exercise prescription and nutrition guidelines.;Long Term:  Cholesterol controlled with medications as prescribed, with individualized exercise RX and with personalized nutrition plan. Value goals: LDL < 70mg , HDL > 40 mg.           Education:Diabetes - Individual verbal and written instruction to review signs/symptoms of diabetes, desired ranges of glucose level fasting, after meals and with exercise. Acknowledge that pre and post exercise glucose checks will be done for 3 sessions at entry of program. Flowsheet Row Pulmonary Rehab from 01/24/2021 in [facility] Cardiac and Pulmonary Rehab  Date 01/17/21   Educator [facility]  Instruction Review Code 1- Verbalizes Understanding      Know Your Numbers and Heart Failure: - Group verbal and visual instruction to discuss disease risk factors for cardiac and pulmonary disease and treatment options.  Reviews associated critical values for Overweight/Obesity, Hypertension, Cholesterol, and Diabetes.  Discusses basics of heart failure: signs/symptoms and treatments.  Introduces Heart Failure Zone chart for action plan for heart failure.  Written material given at graduation. Flowsheet Row Pulmonary Rehab from 01/24/2021 in [facility] Cardiac and Pulmonary Rehab  Date 01/24/21  Educator [facility]  Instruction Review Code 1- Verbalizes Understanding      Core Components/Risk Factors/Patient Goals Review:    Core Components/Risk Factors/Patient Goals at Discharge (Final Review):    ITP Comments:  ITP Comments    Row Name 01/07/21 1553 01/17/21 1034 01/22/21 1114 01/23/21 1120 02/05/21 1630   ITP Comments Virtual orientation call completed today. he has an appointment on Date: 01/17/2021 for EP eval and gym Orientation.  Documentation of diagnosis [facility] be found in [facility]  Date: 12/19/2020.   Trejan is a current tobacco user. Intervention for tobacco cessation was provided at the initial medical review. He was asked about readiness to quit and reported he is not ready to quit at this time . Patient was advised and educated about tobacco cessation using combination therapy, tobacco cessation classes, quit line, and quit smoking apps. Patient demonstrated understanding of this material. Staff will continue to provide encouragement and follow up with the patient throughout the program. Completed 6MWT and gym orientation. Initial ITP created and sent for review to Dr. Emily Filbert, Medical Director. First full day of exercise!  Patient was oriented to gym and equipment including functions, settings, policies, and procedures.  Patient's individual exercise prescription and treatment plan  were reviewed.  All starting workloads were established based on the results of the 6 minute walk test done at initial orientation visit.  The plan for exercise progression was also introduced and progression will be customized based on patient's performance and goals. 30 Day review completed. Medical Director ITP review done, changes made as directed, and signed approval by Medical Director.  New to program Gleb is currently admitted for respiratory failure.  Spoke with case manager today to get clearance set up for him to return to rehab.   [place] Name 02/13/21 1005 02/20/21 0630         ITP Comments Hunner is currently on a medical hold due to deconditioning from 9 day hospital stay - enrolling in PT and will restart rehab when done. 30 Day review completed. Medical Director ITP review done, changes made as directed, and signed approval by Medical Director.  Out for medical reasons             Comments:

## 2021-02-21 ENCOUNTER — Inpatient Hospital Stay: Payer: PPO

## 2021-02-21 DIAGNOSIS — N179 Acute kidney failure, unspecified: Secondary | ICD-10-CM | POA: Diagnosis not present

## 2021-02-21 LAB — BASIC METABOLIC PANEL
Anion gap: 9 (ref 5–15)
BUN: 65 mg/dL — ABNORMAL HIGH (ref 8–23)
CO2: 22 mmol/L (ref 22–32)
Calcium: 8 mg/dL — ABNORMAL LOW (ref 8.9–10.3)
Chloride: 102 mmol/L (ref 98–111)
Creatinine, Ser: 3.33 mg/dL — ABNORMAL HIGH (ref 0.61–1.24)
GFR, Estimated: 19 mL/min — ABNORMAL LOW (ref >60)
Glucose, Bld: 163 mg/dL — ABNORMAL HIGH (ref 70–99)
Potassium: 5.8 mmol/L — ABNORMAL HIGH (ref 3.5–5.1)
Sodium: 133 mmol/L — ABNORMAL LOW (ref 135–145)

## 2021-02-21 LAB — CBC WITH DIFFERENTIAL/PLATELET
Abs Immature Granulocytes: 0.03 10*3/uL (ref 0.00–0.07)
Basophils Absolute: 0 10*3/uL (ref 0.0–0.1)
Basophils Relative: 0 %
Eosinophils Absolute: 0 10*3/uL (ref 0.0–0.5)
Eosinophils Relative: 0 %
HCT: 25.7 % — ABNORMAL LOW (ref 39.0–52.0)
Hemoglobin: 7.6 g/dL — ABNORMAL LOW (ref 13.0–17.0)
Immature Granulocytes: 0 %
Lymphocytes Relative: 10 %
Lymphs Abs: 0.9 10*3/uL (ref 0.7–4.0)
MCH: 23.2 pg — ABNORMAL LOW (ref 26.0–34.0)
MCHC: 29.6 g/dL — ABNORMAL LOW (ref 30.0–36.0)
MCV: 78.6 fL — ABNORMAL LOW (ref 80.0–100.0)
Monocytes Absolute: 0.7 10*3/uL (ref 0.1–1.0)
Monocytes Relative: 8 %
Neutro Abs: 7 10*3/uL (ref 1.7–7.7)
Neutrophils Relative %: 82 %
Platelets: 275 10*3/uL (ref 150–400)
RBC: 3.27 MIL/uL — ABNORMAL LOW (ref 4.22–5.81)
RDW: 20.4 % — ABNORMAL HIGH (ref 11.5–15.5)
WBC: 8.7 10*3/uL (ref 4.0–10.5)
nRBC: 0 % (ref 0.0–0.2)

## 2021-02-21 LAB — GLUCOSE, CAPILLARY
Glucose-Capillary: 116 mg/dL — ABNORMAL HIGH (ref 70–99)
Glucose-Capillary: 124 mg/dL — ABNORMAL HIGH (ref 70–99)
Glucose-Capillary: 145 mg/dL — ABNORMAL HIGH (ref 70–99)
Glucose-Capillary: 148 mg/dL — ABNORMAL HIGH (ref 70–99)
Glucose-Capillary: 201 mg/dL — ABNORMAL HIGH (ref 70–99)

## 2021-02-21 LAB — KAPPA/LAMBDA LIGHT CHAINS
Kappa free light chain: 61.7 mg/L — ABNORMAL HIGH (ref 3.3–19.4)
Kappa, lambda light chain ratio: 1.65 (ref 0.26–1.65)
Lambda free light chains: 37.5 mg/L — ABNORMAL HIGH (ref 5.7–26.3)

## 2021-02-21 MED ORDER — FUROSEMIDE 10 MG/ML IJ SOLN
60.0000 mg | Freq: Once | INTRAMUSCULAR | Status: AC
  Administered 2021-02-21: 60 mg via INTRAVENOUS
  Filled 2021-02-21: qty 6

## 2021-02-21 MED ORDER — SODIUM ZIRCONIUM CYCLOSILICATE 10 G PO PACK
10.0000 g | PACK | Freq: Once | ORAL | Status: DC
  Filled 2021-02-21: qty 1

## 2021-02-21 NOTE — Consult Note (Addendum)
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 Pt is frequently incontinent of urine prior to admission and currently has a Primo fit to attempt to contain the urine.  Pressure Injury POA: Yes Measurement: Left buttock 1X1X.1cm, red and moist, small amt yellow drainage, shaggy irregular edges.  Right buttock 2X2X.1cm, red and moist, small amt yellow drainage, shaggy irregular edges.  Sacrum/gluteal fold with partial thickness fissure related to moisture; .2X.1X.1cm, red and moist Dressing procedure/placement/frequency: Topical treatment orders provided for bedside nurses to perform as follows to protect and promote healing: Foam dressing to bilat buttocks; change Q 3 days and PRN when soiled. Please re-consult if further assistance is needed.  Thank-you,  Julien Girt MSN, [place], [place], [place], [place]

## 2021-02-21 NOTE — Progress Notes (Signed)
[facility]  ROUNDING NOTE   Subjective:   [name] is a 75 y.o.male with past medical history of chronic Atrial Fibrillation, CHF, diabetes, CKD stage 3, GERD and hypertension. He presents to the ED with generalized weakness for 3 days.   We were consulted for evaluate of acute kidney injury. Baseline Creatinine of 1.95, BUN 70, and EGFR 15 on admission.  Patient seen today anxious laying bed States he is having difficulty breathing RN at bedside giving lasix Currently on 4L [place], patient requesting CPAP   Objective:  Vital signs in last 24 hours:  Temp:  [97.9 F (36.6 C)-99 F (37.2 C)] 97.9 F (36.6 C) (04/21 0442) Pulse Rate:  [44-56] 51 (04/21 0442) Resp:  [16-20] 16 (04/21 0442) BP: (96-161)/(44-61) 143/44 (04/21 0442) SpO2:  [90 %-98 %] 98 % (04/21 1050)  Weight change:  Filed Weights   02/19/21 0836  Weight: 74 kg    Intake/Output: I/O last 3 completed shifts: In: 240 [P.O.:240] Out: 600 [Urine:600]   Intake/Output this shift:  Total I/O In: -  Out: 250 [Urine:250]  Physical Exam: General: NAD, anxious  Head: Normocephalic, atraumatic. Moist oral mucosal membranes  Eyes: Anicteric  Neck: Supple, trachea midline  Lungs:  Diminished in bases, tachypnea, O2 4L [place]  Heart: Regular rate and rhythm  Abdomen:  Soft, nontender,   Extremities:  1+ peripheral edema.  Neurologic: Nonfocal, moving all four extremities  Skin: No lesions       Basic Metabolic Panel: Recent Labs  Lab 02/19/21 0849 02/20/21 0454 02/20/21 0755 02/21/21 0527  NA 133*  --  133* 133*  K 4.6  --  5.8* 5.8*  CL 103  --  100 102  CO2 20*  --  23 22  GLUCOSE 48*  --  74 163*  BUN 70*  --  68* 65*  CREATININE 3.95* 3.63* 3.65* 3.33*  CALCIUM 7.5*  --  7.9* 8.0*    Liver Function Tests: Recent Labs  Lab 02/19/21 0849  AST 15  ALT 12  ALKPHOS 102  BILITOT 0.7  PROT 5.6*  ALBUMIN 2.8*   No results for input(s): LIPASE, AMYLASE in the last 168 hours. No  results for input(s): AMMONIA in the last 168 hours.  CBC: Recent Labs  Lab 02/19/21 0849 02/20/21 0755 02/21/21 0527  WBC 10.0 8.1 8.7  NEUTROABS 8.6* 6.3 7.0  HGB 8.1* 7.7* 7.6*  HCT 26.2* 24.7* 25.7*  MCV 78.0* 76.5* 78.6*  PLT 276 266 275    Cardiac Enzymes: Recent Labs  Lab 02/19/21 1056  CKTOTAL 34*    BNP: Invalid input(s): POCBNP  CBG: Recent Labs  Lab 02/20/21 1657 02/20/21 2123 02/21/21 0016 02/21/21 0825 02/21/21 1148  GLUCAP 206* 180* 145* 148* 201*    Microbiology: Results for orders placed or performed during the hospital encounter of 02/19/21  Resp Panel by RT-PCR (Flu A&B, Covid) Nasopharyngeal Swab     Status: None   Collection Time: 02/19/21  9:56 AM   Specimen: Nasopharyngeal Swab; Nasopharyngeal(NP) swabs in vial transport medium  Result Value Ref Range Status   SARS Coronavirus 2 by RT PCR NEGATIVE NEGATIVE Final    Comment: (NOTE) SARS-CoV-2 target nucleic acids are NOT DETECTED.  The SARS-CoV-2 RNA is generally detectable in upper respiratory specimens during the acute phase of infection. The lowest concentration of SARS-CoV-2 viral copies this assay can detect is 138 copies/mL. A negative result does not preclude SARS-Cov-2 infection and should not be used as the sole basis for  treatment or other patient management decisions. A negative result may occur with  improper specimen collection/handling, submission of specimen other than nasopharyngeal swab, presence of viral mutation(s) within the areas targeted by this assay, and inadequate number of viral copies(<138 copies/mL). A negative result must be combined with clinical observations, patient history, and epidemiological information. The expected result is Negative.  Fact Sheet for Patients:  EntrepreneurPulse.com.au  Fact Sheet for Healthcare Providers:  IncredibleEmployment.be  This test is no t yet approved or cleared by the [place]  FDA and  has been authorized for detection and/or diagnosis of SARS-CoV-2 by FDA under an Emergency Use Authorization (EUA). This EUA will remain  in effect (meaning this test can be used) for the duration of the COVID-19 declaration under Section 564(b)(1) of the Act, 21 U.S.C.section 360bbb-3(b)(1), unless the authorization is terminated  or revoked sooner.       Influenza A by PCR NEGATIVE NEGATIVE Final   Influenza B by PCR NEGATIVE NEGATIVE Final    Comment: (NOTE) The Xpert Xpress SARS-CoV-2/FLU/RSV plus assay is intended as an aid in the diagnosis of influenza from Nasopharyngeal swab specimens and should not be used as a sole basis for treatment. Nasal washings and aspirates are unacceptable for Xpert Xpress SARS-CoV-2/FLU/RSV testing.  Fact Sheet for Patients: EntrepreneurPulse.com.au  Fact Sheet for Healthcare Providers: IncredibleEmployment.be  This test is not yet approved or cleared by the [place] FDA and has been authorized for detection and/or diagnosis of SARS-CoV-2 by FDA under an Emergency Use Authorization (EUA). This EUA will remain in effect (meaning this test can be used) for the duration of the COVID-19 declaration under Section 564(b)(1) of the Act, 21 U.S.C. section 360bbb-3(b)(1), unless the authorization is terminated or revoked.  Performed at [facility], [address] [place], [place] [address]     Coagulation Studies: No results for input(s): LABPROT, INR in the last 72 hours.  Urinalysis: Recent Labs    02/20/21 1840  COLORURINE YELLOW*  LABSPEC 1.011  PHURINE 5.0  GLUCOSEU NEGATIVE  HGBUR NEGATIVE  BILIRUBINUR NEGATIVE  KETONESUR NEGATIVE  PROTEINUR NEGATIVE  NITRITE NEGATIVE  LEUKOCYTESUR NEGATIVE      Imaging: US RENAL  Result Date: 02/20/2021 CLINICAL DATA:  Acute renal failure.  Chronic renal disease. EXAM: RENAL / URINARY TRACT ULTRASOUND COMPLETE COMPARISON:  No  recent prior. FINDINGS: Right Kidney: Renal measurements: 9.8 x 4.5 x 4.0 cm = volume: 91.2 mL. Mild increased echogenicity. No mass or hydronephrosis visualized. Left Kidney: Renal measurements: 9.4 x 5.3 x 5.1 cm = volume: 132.8 mL. Mild increased echogenicity. No mass or hydronephrosis visualized. Bladder: Mild bladder distention cannot be excluded. Other: Small right pleural effusion cannot be excluded. IMPRESSION: 1. Mild increased echogenicity both kidneys suggesting chronic medical renal disease. No acute renal abnormality identified. 2.  Bladder distention cannot be excluded. 3.  Small right pleural effusion cannot be excluded. Electronically Signed   By: Marcello Moores  Register   On: 02/20/2021 13:37   DG Chest Port 1 View  Result Date: 02/21/2021 CLINICAL DATA:  Shortness of breath, weakness, history COPD, hypertension, diabetes mellitus, stroke, atrial fibrillation, smoker, CHF, stage III chronic kidney disease EXAM: PORTABLE CHEST 1 VIEW COMPARISON:  Portable exam 0821 hours compared to 02/19/2021 FINDINGS: Enlargement of cardiac silhouette with vascular congestion post CABG. Mediastinal contours normal. Atherosclerotic calcification aorta. Interstitial infiltrates in both lungs increased since previous exam favor pulmonary edema though multifocal infection not completely excluded. Tiny LEFT pleural effusion. No pneumothorax or acute osseous findings. IMPRESSION:  Enlargement of cardiac silhouette with pulmonary vascular congestion post CABG. Diffuse infiltrates increased from prior exam favor pulmonary edema/CHF. Tiny LEFT pleural effusion. Aortic Atherosclerosis (ICD10-I70.0). Electronically Signed   By: Lavonia Dana M.D.   On: 02/21/2021 10:17     Medications:   . ampicillin-sulbactam (UNASYN) IV 3 g (02/20/21 2248)   . acidophilus  1 capsule Oral Daily  . apixaban  5 mg Oral BID  . cholecalciferol  1,000 Units Oral Daily  . gabapentin  100 mg Oral BID  . hydrocortisone  25 mg Rectal BID  .  insulin aspart  0-15 Units Subcutaneous TID WC  . iron polysaccharides  150 mg Oral Daily  . magnesium oxide  400 mg Oral Daily  . multivitamin-lutein  1 capsule Oral Daily  . nicotine  21 mg Transdermal Daily  . pantoprazole  40 mg Oral Daily  . Ensure Max Protein  11 oz Oral BID BM  . simvastatin  20 mg Oral q AM  . sodium zirconium cyclosilicate  10 g Oral Once  . tiotropium  18 mcg Inhalation Daily  . traZODone  50 mg Oral QHS   acetaminophen, albuterol, ipratropium-albuterol, ondansetron **OR** ondansetron (ZOFRAN) IV, oxyCODONE  Assessment/ Plan:  Mr. Wendel Homeyer is a 75 y.o.  male with past medical history of chronic Atrial Fibrillation, CHF, diabetes, CKD stage 3, GERD and hypertension. He presents to the ED with generalized weakness for 3 days.  1. Acute Kidney Injury on chronic kidney disease stage B with baseline creatinine 19 and GFR of 35.  Chronic kidney disease requires further work up Likely due to over diuresis Will monitor further diuresis  Renal ultrasound negative for obstruction. Chronic renal disease evident SPEP/UPEP pending Hepatitis studies negative Urine studies negative   Lab Results  Component Value Date   CREATININE 3.33 (H) 02/21/2021   CREATININE 3.65 (H) 02/20/2021   CREATININE 3.63 (H) 02/20/2021    Intake/Output Summary (Last 24 hours) at 02/21/2021 1154 Last data filed at 02/21/2021 1052 Gross per 24 hour  Intake 0 ml  Output 300 ml  Net -300 ml   2. Hyponatremia secondary to acute kidney disease Current level 133   3. Hypertension with congestive heart failure - acceptable for this patient - Albumin given yesterday - Home medications held  4. Anemia with kidney failure and iron deficiency Microcytic Hgb 7.6 Niferex 150 mg    LOS: 2   4/21/202211:54 AM

## 2021-02-21 NOTE — Progress Notes (Signed)
PROGRESS NOTE    [name]  TGG:[id number] DOB: 1946/01/18 DOA: 02/19/2021 PCP: [name], MD   Brief Narrative:  [name] is a 75 y.o. 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 Patient states that he has had weakness for the last 3 days which has made it difficult for him to ambulate.  This morning he states he was unable to get off the toilet and actually had a fall this morning. He denies feeling dizzy or lightheaded and complains of just generalized weakness. He denies having any chest pain, no shortness of breath, no fever, no chills, no headache, no abdominal pain, no nausea, no vomiting, no diarrhea, no urinary symptoms, no blurred vision, no difficulty swallowing, no headache.  Found to be bradycardic, hypoglycemic, borderline hypotensive.  Found to have significant AKI thought to be due to overdiuresis.  Case discussed with cardiology and nephrology.  Recommendations appreciated.  4/21: This morning patient was complaining of worsening shortness of breath.  Stat chest x-ray demonstrates fluid overload.  Administered 60 mg IV Lasix.  CPAP ordered.   Assessment & Plan:   Principal Problem:   AKI (acute kidney injury) ([facility]) Active Problems:   Chronic kidney disease (CKD) stage G3b/A3, moderately decreased glomerular filtration rate (GFR) between 30-44 mL/min/1.73 square meter and albuminuria creatinine ratio greater than 300 mg/g (HCC)   History of CVA (cerebrovascular accident)   Chronic diastolic CHF (congestive heart failure) (HCC)   Atrial fibrillation, chronic (HCC)   Type 2 diabetes mellitus with hypoglycemia without coma (HCC)   Bradycardia   Weakness generalized   Dental infection    Pressure injury of skin  Acute kidney injury on chronic kidney disease stage IIIb Hyperkalemia Hyponatremia Thought to be secondary to overdiuresis Developed edema on chest x-ray At baseline patient has a serum creatinine of 1.95, on admission his creatinine is 3.95 Slowly downtrending Nephrology consulted Plan: No further IV fluids Lasix 60 mg IV x1 Careful management of volume status Daily BMP  Atrial fibrillation with slow ventricular response Chronic issue per Dr. Ubaldo Glassing who the patient is well-known to Plan: Avoid AV nodal blocking agents Continue apixaban for anticoagulation, may need dose reduction if kidney function does not recover  Diabetes mellitus with complications of stage III chronic kidney disease and hypoglycemia Hold insulin Blood sugar checks before meals and at bedtime Encourage oral intake    COPD with acute on chronic respiratory failure Not acutely exacerbated Continue Spiriva, as needed bronchodilator therapy and inhaled steroids Wean oxygen as tolerated   Acute on chronic diastolic congestive heart failure Exacerbation likely driven by IV volume resuscitation Diuresis 60 mg IV Lasix x1, reassess for diuretic need daily Holding home diuretics   Coronary artery disease Stable and not acutely exacerbated Continue statins and aspirin Hold nitrates    Generalized weakness/fall  Multifactorial and related to bradycardia as well as hypotension Place patient on fall precautions We will request PT evaluation once his acute illness improves/resolves    Dental infection Patient noted to have purulence around his gum line Cervical spine CT shows an expansile lucent lesion with pathologic fracture  identified involving the midline the maxilla. Recommend further evaluation with dedicated maxillofacial CT with contrast. ENT was consulted in the ER and he recommends to obtain maxillofacial  CT with contrast once renal function  improves We will place patient empirically on Unasyn adjusted to renal function   DVT prophylaxis: Eliquis Code Status: Full Family Communication: None today Disposition Plan: Status is: Inpatient  Remains inpatient appropriate because:Inpatient level of care appropriate due to severity of illness   Dispo: The patient is from: Home              Anticipated d/c is to: Home              Patient currently is not medically stable to d/c.   Difficult to place patient No  AKI on CKD stage IIIb and atrial fibrillation with slow ventricular response.     Level of care: Progressive Cardiac  Consultants:  Nephrology Cardiology   Procedures:None  Antimicrobials:   Unasyn   Subjective: Seen and examined.  Short of breath.  Anxious appearing.  Objective: Vitals:   02/21/21 0312 02/21/21 0442 02/21/21 0813 02/21/21 1050  BP: 134/61 (!) 143/44    Pulse: (!) 56 (!) 51    Resp: 20 16    Temp: 98.9 F (37.2 C) 97.9 F (36.6 C)    TempSrc: Oral     SpO2: 95% 95% 94% 98%  Weight:      Height:        Intake/Output Summary (Last 24 hours) at 02/21/2021 1405 Last data filed at 02/21/2021 1052 Gross per 24 hour  Intake 0 ml  Output 300 ml  Net -300 ml   Filed Weights   02/19/21 0836  Weight: 74 kg    Examination:  General exam: Mild distress due to anxiety and shortness of breath.  Appears chronically ill Respiratory system: Poor inspiratory effort.  Scattered crackles bilaterally.  Increased work of breathing.  4 L  cardiovascular system: S1-S2, bradycardic, no appreciable murmurs, 2+ pedal edema Gastrointestinal system: Abdomen is nondistended, soft and nontender. No organomegaly or masses felt. Normal bowel sounds heard. Central nervous system: Alert, oriented x2, no focal deficits, lethargic extremities: Symmetric 5 x 5 power. Skin: No rashes, lesions or ulcers Psychiatry: Judgement and insight appear impaired. Mood & affect flattened.     Data Reviewed: I have  personally reviewed following labs and imaging studies  CBC: Recent Labs  Lab 02/19/21 0849 02/20/21 0755 02/21/21 0527  WBC 10.0 8.1 8.7  NEUTROABS 8.6* 6.3 7.0  HGB 8.1* 7.7* 7.6*  HCT 26.2* 24.7* 25.7*  MCV 78.0* 76.5* 78.6*  PLT 276 266 315   Basic Metabolic Panel: Recent Labs  Lab 02/19/21 0849 02/20/21 0454 02/20/21 0755 02/21/21 0527  NA 133*  --  133* 133*  K 4.6  --  5.8* 5.8*  CL 103  --  100 102  CO2 20*  --  23 22  GLUCOSE 48*  --  74 163*  BUN 70*  --  68* 65*  CREATININE 3.95* 3.63* 3.65* 3.33*  CALCIUM 7.5*  --  7.9* 8.0*   GFR: Estimated Creatinine Clearance: 17.3 mL/min (A) (by C-G formula based on SCr of 3.33 mg/dL (H)). Liver Function Tests: Recent Labs  Lab 02/19/21 0849  AST 15  ALT 12  ALKPHOS 102  BILITOT 0.7  PROT 5.6*  ALBUMIN 2.8*   No results for input(s): LIPASE, AMYLASE in the last 168 hours. No results for input(s): AMMONIA in the last 168 hours. Coagulation Profile: No results for input(s): INR, PROTIME in the last 168 hours. Cardiac Enzymes: Recent Labs  Lab 02/19/21 1056  CKTOTAL 34*   BNP (last 3 results)  No results for input(s): PROBNP in the last 8760 hours. HbA1C: No results for input(s): HGBA1C in the last 72 hours. CBG: Recent Labs  Lab 02/20/21 1657 02/20/21 2123 02/21/21 0016 02/21/21 0825 02/21/21 1148  GLUCAP 206* 180* 145* 148* 201*   Lipid Profile: No results for input(s): CHOL, HDL, LDLCALC, TRIG, CHOLHDL, LDLDIRECT in the last 72 hours. Thyroid Function Tests: No results for input(s): TSH, T4TOTAL, FREET4, T3FREE, THYROIDAB in the last 72 hours. Anemia Panel: No results for input(s): VITAMINB12, FOLATE, FERRITIN, TIBC, IRON, RETICCTPCT in the last 72 hours. Sepsis Labs: No results for input(s): PROCALCITON, LATICACIDVEN in the last 168 hours.  Recent Results (from the past 240 hour(s))  Resp Panel by RT-PCR (Flu A&B, Covid) Nasopharyngeal Swab     Status: None   Collection Time: 02/19/21   9:56 AM   Specimen: Nasopharyngeal Swab; Nasopharyngeal(NP) swabs in vial transport medium  Result Value Ref Range Status   SARS Coronavirus 2 by RT PCR NEGATIVE NEGATIVE Final    Comment: (NOTE) SARS-CoV-2 target nucleic acids are NOT DETECTED.  The SARS-CoV-2 RNA is generally detectable in upper respiratory specimens during the acute phase of infection. The lowest concentration of SARS-CoV-2 viral copies this assay can detect is 138 copies/mL. A negative result does not preclude SARS-Cov-2 infection and should not be used as the sole basis for treatment or other patient management decisions. A negative result may occur with  improper specimen collection/handling, submission of specimen other than nasopharyngeal swab, presence of viral mutation(s) within the areas targeted by this assay, and inadequate number of viral copies(<138 copies/mL). A negative result must be combined with clinical observations, patient history, and epidemiological information. The expected result is Negative.  Fact Sheet for Patients:  EntrepreneurPulse.com.au  Fact Sheet for Healthcare Providers:  IncredibleEmployment.be  This test is no t yet approved or cleared by the [place] FDA and  has been authorized for detection and/or diagnosis of SARS-CoV-2 by FDA under an Emergency Use Authorization (EUA). This EUA will remain  in effect (meaning this test can be used) for the duration of the COVID-19 declaration under Section 564(b)(1) of the Act, 21 U.S.C.section 360bbb-3(b)(1), unless the authorization is terminated  or revoked sooner.       Influenza A by PCR NEGATIVE NEGATIVE Final   Influenza B by PCR NEGATIVE NEGATIVE Final    Comment: (NOTE) The Xpert Xpress SARS-CoV-2/FLU/RSV plus assay is intended as an aid in the diagnosis of influenza from Nasopharyngeal swab specimens and should not be used as a sole basis for treatment. Nasal washings and aspirates  are unacceptable for Xpert Xpress SARS-CoV-2/FLU/RSV testing.  Fact Sheet for Patients: EntrepreneurPulse.com.au  Fact Sheet for Healthcare Providers: IncredibleEmployment.be  This test is not yet approved or cleared by the [place] FDA and has been authorized for detection and/or diagnosis of SARS-CoV-2 by FDA under an Emergency Use Authorization (EUA). This EUA will remain in effect (meaning this test can be used) for the duration of the COVID-19 declaration under Section 564(b)(1) of the Act, 21 U.S.C. section 360bbb-3(b)(1), unless the authorization is terminated or revoked.  Performed at [facility], [address], [place], [place] [address]          Radiology Studies: US RENAL  Result Date: 02/20/2021 CLINICAL DATA:  Acute renal failure.  Chronic renal disease. EXAM: RENAL / URINARY TRACT ULTRASOUND COMPLETE COMPARISON:  No recent prior. FINDINGS: Right Kidney: Renal measurements: 9.8 x 4.5 x 4.0 cm = volume: 91.2 mL. Mild increased echogenicity. No  mass or hydronephrosis visualized. Left Kidney: Renal measurements: 9.4 x 5.3 x 5.1 cm = volume: 132.8 mL. Mild increased echogenicity. No mass or hydronephrosis visualized. Bladder: Mild bladder distention cannot be excluded. Other: Small right pleural effusion cannot be excluded. IMPRESSION: 1. Mild increased echogenicity both kidneys suggesting chronic medical renal disease. No acute renal abnormality identified. 2.  Bladder distention cannot be excluded. 3.  Small right pleural effusion cannot be excluded. Electronically Signed   By: Marcello Moores  Register   On: 02/20/2021 13:37   DG Chest Port 1 View  Result Date: 02/21/2021 CLINICAL DATA:  Shortness of breath, weakness, history COPD, hypertension, diabetes mellitus, stroke, atrial fibrillation, smoker, CHF, stage III chronic kidney disease EXAM: PORTABLE CHEST 1 VIEW COMPARISON:  Portable exam 0821 hours compared to 02/19/2021  FINDINGS: Enlargement of cardiac silhouette with vascular congestion post CABG. Mediastinal contours normal. Atherosclerotic calcification aorta. Interstitial infiltrates in both lungs increased since previous exam favor pulmonary edema though multifocal infection not completely excluded. Tiny LEFT pleural effusion. No pneumothorax or acute osseous findings. IMPRESSION: Enlargement of cardiac silhouette with pulmonary vascular congestion post CABG. Diffuse infiltrates increased from prior exam favor pulmonary edema/CHF. Tiny LEFT pleural effusion. Aortic Atherosclerosis (ICD10-I70.0). Electronically Signed   By: Lavonia Dana M.D.   On: 02/21/2021 10:17        Scheduled Meds: . acidophilus  1 capsule Oral Daily  . apixaban  5 mg Oral BID  . cholecalciferol  1,000 Units Oral Daily  . gabapentin  100 mg Oral BID  . hydrocortisone  25 mg Rectal BID  . insulin aspart  0-15 Units Subcutaneous TID WC  . iron polysaccharides  150 mg Oral Daily  . magnesium oxide  400 mg Oral Daily  . multivitamin-lutein  1 capsule Oral Daily  . nicotine  21 mg Transdermal Daily  . pantoprazole  40 mg Oral Daily  . Ensure Max Protein  11 oz Oral BID BM  . simvastatin  20 mg Oral q AM  . sodium zirconium cyclosilicate  10 g Oral Once  . tiotropium  18 mcg Inhalation Daily  . traZODone  50 mg Oral QHS   Continuous Infusions: . ampicillin-sulbactam (UNASYN) IV 3 g (02/21/21 1216)     LOS: 2 days    Time spent: 25 minutes    Sidney Ace, MD [facility] Pager [phone]  If 7PM-7AM, please contact night-coverage 02/21/2021, 2:05 PM

## 2021-02-21 NOTE — Progress Notes (Signed)
PT Cancellation Note  Patient Details Name: [name] MRN: [id number] DOB: 05/21/1946   Cancelled Treatment:    Reason Eval/Treat Not Completed: Patient declined, no reason specified;Medical issues which prohibited therapy. Patient is very short of breath this morning. On 4 lpm O2 and just trying to talk to me saturations drop to 88%. Patient reports he cannot eat because he cannot breathe. Patient not able to participate in PT session at this time. Will re-attempt later today if able. RN aware. Discussed with NT.    Wasil Wolke 02/21/2021, 10:17 AM

## 2021-02-21 NOTE — Progress Notes (Signed)
Patient Name: [name] Date of Encounter: 02/21/2021  Hospital Problem List     Principal Problem:   AKI (acute kidney injury) [facility]) Active Problems:   Chronic kidney disease (CKD) stage G3b/A3, moderately decreased glomerular filtration rate (GFR) between 30-44 mL/min/1.73 square meter and albuminuria creatinine ratio greater than 300 mg/g (HCC)   History of CVA (cerebrovascular accident)   Chronic diastolic CHF (congestive heart failure) (HCC)   Atrial fibrillation, chronic (HCC)   Type 2 diabetes mellitus with hypoglycemia without coma (HCC)   Bradycardia   Weakness generalized   Dental infection   Pressure injury of skin    Patient Profile      75 y.o.malewith history ofcoronary artery disease s/p CABG in 2006, history of HFrEF with most recent EF estimated between 50-55%, paroxsymal atrial fibrillation, anticoagulated with Eliquis, history of aortic valve stenosis, history of a CVA, chronic kidney disease, type 2 diabetes, hyperlipidemia, hypertension, and tobacco abusewho was admitted after presenting to the emergency room with acute congestive heart failure. He has also evidence of volume depletion. He has had several admissions recently for the same. He is somewhat intermittently compliant with taking the proper diuretic dose. He often will take multiple extra doses if he feels like he is fluid overloaded. We have had discussions regarding this. He also eats a fairly high sodium diet. Discussions have been made regarding this however he eats frozen meals which typically contain more than 2 g of salt every meal. He has chronically lower extremity edema. He has attempted to keep them elevated and has not been compliant with compression garments. He states he has had increasing weakness and fatigue over the last several days. Morning of admission was a unable to get off the toilet due to severe lower extremity weakness with instability when ambulating due to leg  weakness. He denies syncope. He denies chest pain. He denies shortness of breath nausea or vomiting. Laboratory showed a BUN of 70 and a creatinine of 3.95. His baseline appears to have been a BUN and high 40s with a creatinine around 1.9-2.0. His troponin was minimally elevated at 83. Chest x-ray showed cardiomegaly with increased pulmonary interstitial thickening. There was no evidence of overt congestive heart failure. Cervical spine and head CT were unremarkable for acute abnormalities. There was chronic small vessel ischemic disease and brain atrophy on the brain CT. No evidence of cervical spine fracture. EKG showed atrial fibrillation with slow ventricular response. Review of records from office visits recently show heart rates in the 40-60 range. His serum glucose was 48 on presentation. He received D50 for this. He reports compliance with his medications. He continues to smoke.  Subjective   Still feels poorly.  Inpatient Medications    . acidophilus  1 capsule Oral Daily  . apixaban  5 mg Oral BID  . cholecalciferol  1,000 Units Oral Daily  . gabapentin  100 mg Oral BID  . hydrocortisone  25 mg Rectal BID  . insulin aspart  0-15 Units Subcutaneous TID WC  . iron polysaccharides  150 mg Oral Daily  . magnesium oxide  400 mg Oral Daily  . multivitamin-lutein  1 capsule Oral Daily  . nicotine  21 mg Transdermal Daily  . pantoprazole  40 mg Oral Daily  . Ensure Max Protein  11 oz Oral BID BM  . simvastatin  20 mg Oral q AM  . sodium zirconium cyclosilicate  10 g Oral Once  . tiotropium  18 mcg Inhalation Daily  . traZODone  50 mg Oral QHS    Vital Signs    Vitals:   02/21/21 0312 02/21/21 0442 02/21/21 0813 02/21/21 1050  BP: 134/61 (!) 143/44    Pulse: (!) 56 (!) 51    Resp: 20 16    Temp: 98.9 F (37.2 C) 97.9 F (36.6 C)    TempSrc: Oral     SpO2: 95% 95% 94% 98%  Weight:      Height:        Intake/Output Summary (Last 24 hours) at 02/21/2021  1654 Last data filed at 02/21/2021 1615 Gross per 24 hour  Intake 0 ml  Output 300 ml  Net -300 ml   Filed Weights   02/19/21 0836  Weight: 74 kg    Physical Exam    GEN: Chronically and acutely ill male complaining of fatigue and shortness of breath HEENT: normal.  Neck: Supple, no JVD, carotid bruits, or masses. Cardiac: Irregular with 4+ lower extremity edema. Respiratory:  Respirations regular with decreased breath sounds bilaterally. GI: Soft, nontender, nondistended, BS + x 4. MS: no deformity or atrophy. Skin: warm and dry, no rash. Neuro:  Strength and sensation are intact. Psych: Normal affect.  Labs    CBC Recent Labs    02/20/21 0755 02/21/21 0527  WBC 8.1 8.7  NEUTROABS 6.3 7.0  HGB 7.7* 7.6*  HCT 24.7* 25.7*  MCV 76.5* 78.6*  PLT 266 202   Basic Metabolic Panel Recent Labs    02/20/21 0755 02/21/21 0527  NA 133* 133*  K 5.8* 5.8*  CL 100 102  CO2 23 22  GLUCOSE 74 163*  BUN 68* 65*  CREATININE 3.65* 3.33*  CALCIUM 7.9* 8.0*   Liver Function Tests Recent Labs    02/19/21 0849  AST 15  ALT 12  ALKPHOS 102  BILITOT 0.7  PROT 5.6*  ALBUMIN 2.8*   No results for input(s): LIPASE, AMYLASE in the last 72 hours. Cardiac Enzymes Recent Labs    02/19/21 1056  CKTOTAL 34*   BNP No results for input(s): BNP in the last 72 hours. D-Dimer No results for input(s): DDIMER in the last 72 hours. Hemoglobin A1C No results for input(s): HGBA1C in the last 72 hours. Fasting Lipid Panel No results for input(s): CHOL, HDL, LDLCALC, TRIG, CHOLHDL, LDLDIRECT in the last 72 hours. Thyroid Function Tests No results for input(s): TSH, T4TOTAL, T3FREE, THYROIDAB in the last 72 hours.  Invalid input(s): FREET3  Telemetry     Atrial fibrillation with slow ventricular response  ECG    Atrial fibrillation with slow ventricular response  Radiology    DG Chest 2 View  Result Date: 02/19/2021 CLINICAL DATA:  Weakness. EXAM: CHEST - 2 VIEW  COMPARISON:  02/02/2021 FINDINGS: Midline trachea. Moderate cardiomegaly. Trace left pleural fluid or thickening. No pneumothorax. Interstitial thickening is decreased compared to the prior exam. No lobar consolidation. IMPRESSION: Cardiomegaly with decreased pulmonary interstitial thickening. Likely related to COPD/chronic bronchitis. No overt congestive failure. Similar trace left pleural fluid or thickening. Electronically Signed   By: Abigail Miyamoto M.D.   On: 02/19/2021 09:51   CT Head Wo Contrast  Result Date: 02/19/2021 CLINICAL DATA:  Neck trauma. Generalized weakness for 2-3 days. History of stroke. EXAM: CT HEAD WITHOUT CONTRAST CT CERVICAL SPINE WITHOUT CONTRAST TECHNIQUE: Multidetector CT imaging of the head and cervical spine was performed following the standard protocol without intravenous contrast. Multiplanar CT image reconstructions of the cervical spine were also generated. COMPARISON:  MRI brain 08/08/2016 FINDINGS: CT HEAD FINDINGS  Brain: No evidence of acute infarction, hemorrhage, hydrocephalus, extra-axial collection or mass lesion/mass effect. There is mild diffuse low-attenuation within the subcortical and periventricular white matter compatible with chronic microvascular disease. Remote lacunar infarct in the left cerebellar hemisphere identified. Prominence of the sulci and ventricles compatible with brain atrophy. Vascular: No hyperdense vessel or unexpected calcification. Skull: Normal. Negative for fracture or focal lesion. Sinuses/Orbits: Paranasal sinuses appear clear. Mastoid air cells are clear. Other: There is a lucent bone lesion with pathologic fracture involving the maxilla measuring approximately 2.3 x 1.2 cm, image 9/3. This is only partially visualized. CT CERVICAL SPINE FINDINGS Alignment: Normal Skull base and vertebrae: The vertebral body heights are well maintained. No fractures identified. Soft tissues and spinal canal: No prevertebral fluid or swelling. No visible  canal hematoma. Disc levels: There is partial fusion of C4-5. Marked disc space narrowing, endplate sclerosis and ventral and dorsal osteophyte formation identified at C6-7. Upper chest: Pleural thickening versus small effusion noted within the posterior right chest. Other: None IMPRESSION: 1. No acute intracranial abnormality. 2. 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 3. No evidence for cervical spine fracture. 4. Marked degenerative disc disease identified C6-7 5. Expansile lucent lesion with pathologic fracture is identified involving the midline the maxilla. Recommend further evaluation with dedicated maxillofacial CT with contrast. Electronically Signed   By: Kerby Moors M.D.   On: 02/19/2021 09:38   CT CHEST WO CONTRAST  Result Date: 02/02/2021 CLINICAL DATA:  Respiratory failure EXAM: CT CHEST WITHOUT CONTRAST TECHNIQUE: Multidetector CT imaging of the chest was performed following the standard protocol without IV contrast. COMPARISON:  Chest radiograph 02/01/2021 FINDINGS: Cardiovascular: Coronary artery calcification and aortic atherosclerotic calcification. Post CABG Mediastinum/Nodes: No axillary supraclavicular adenopathy. Enlarged mediastinal lymph nodes are present. For example 17 mm RIGHT lower paratracheal node. 16 mm subcarinal node. Probable mild hilar adenopathy on the RIGHT. Lungs/Pleura: Respiratory motion degrades the lung imaging. Centrilobular emphysema the upper lobes. There bilateral small to moderate pleural effusions greater on the RIGHT. There is bibasilar passive atelectasis. Loculated pleural fluid at the LEFT lung base. There is diffuse ground-glass densities in the RIGHT lower lobe suggesting pulmonary infection or edema. Small amount of mucoid material in the trachea at the level of the carina. Upper Abdomen: Limited view of the liver, kidneys, pancreas are unremarkable. Normal adrenal glands. Musculoskeletal: Midline sternotomy. IMPRESSION: 1. Bilateral  small to moderate pleural effusions greater on the RIGHT. 2. Bibasilar atelectasis. Round atelectasis and loculated fluid at the LEFT lung base. 3. Diffuse ground-glass densities in the RIGHT lower lobe suggest early pneumonia, asymmetric edema or pneumonitis. No focal consolidation. 4. Centrilobular emphysema the upper lobes. 5. Mild mediastinal adenopathy is favored reactive related to COPD. Electronically Signed   By: Suzy Bouchard M.D.   On: 02/02/2021 11:44   CT Cervical Spine Wo Contrast  Result Date: 02/19/2021 CLINICAL DATA:  Neck trauma. Generalized weakness for 2-3 days. History of stroke. EXAM: CT HEAD WITHOUT CONTRAST CT CERVICAL SPINE WITHOUT CONTRAST TECHNIQUE: Multidetector CT imaging of the head and cervical spine was performed following the standard protocol without intravenous contrast. Multiplanar CT image reconstructions of the cervical spine were also generated. COMPARISON:  MRI brain 08/08/2016 FINDINGS: CT HEAD FINDINGS Brain: No evidence of acute infarction, hemorrhage, hydrocephalus, extra-axial collection or mass lesion/mass effect. There is mild diffuse low-attenuation within the subcortical and periventricular white matter compatible with chronic microvascular disease. Remote lacunar infarct in the left cerebellar hemisphere identified. Prominence of the sulci and ventricles compatible with  brain atrophy. Vascular: No hyperdense vessel or unexpected calcification. Skull: Normal. Negative for fracture or focal lesion. Sinuses/Orbits: Paranasal sinuses appear clear. Mastoid air cells are clear. Other: There is a lucent bone lesion with pathologic fracture involving the maxilla measuring approximately 2.3 x 1.2 cm, image 9/3. This is only partially visualized. CT CERVICAL SPINE FINDINGS Alignment: Normal Skull base and vertebrae: The vertebral body heights are well maintained. No fractures identified. Soft tissues and spinal canal: No prevertebral fluid or swelling. No visible canal  hematoma. Disc levels: There is partial fusion of C4-5. Marked disc space narrowing, endplate sclerosis and ventral and dorsal osteophyte formation identified at C6-7. Upper chest: Pleural thickening versus small effusion noted within the posterior right chest. Other: None IMPRESSION: 1. No acute intracranial abnormality. 2. 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 3. No evidence for cervical spine fracture. 4. Marked degenerative disc disease identified C6-7 5. Expansile lucent lesion with pathologic fracture is identified involving the midline the maxilla. Recommend further evaluation with dedicated maxillofacial CT with contrast. Electronically Signed   By: Kerby Moors M.D.   On: 02/19/2021 09:38   US RENAL  Result Date: 02/20/2021 CLINICAL DATA:  Acute renal failure.  Chronic renal disease. EXAM: RENAL / URINARY TRACT ULTRASOUND COMPLETE COMPARISON:  No recent prior. FINDINGS: Right Kidney: Renal measurements: 9.8 x 4.5 x 4.0 cm = volume: 91.2 mL. Mild increased echogenicity. No mass or hydronephrosis visualized. Left Kidney: Renal measurements: 9.4 x 5.3 x 5.1 cm = volume: 132.8 mL. Mild increased echogenicity. No mass or hydronephrosis visualized. Bladder: Mild bladder distention cannot be excluded. Other: Small right pleural effusion cannot be excluded. IMPRESSION: 1. Mild increased echogenicity both kidneys suggesting chronic medical renal disease. No acute renal abnormality identified. 2.  Bladder distention cannot be excluded. 3.  Small right pleural effusion cannot be excluded. Electronically Signed   By: Marcello Moores  Register   On: 02/20/2021 13:37   DG Chest Port 1 View  Result Date: 02/21/2021 CLINICAL DATA:  Shortness of breath, weakness, history COPD, hypertension, diabetes mellitus, stroke, atrial fibrillation, smoker, CHF, stage III chronic kidney disease EXAM: PORTABLE CHEST 1 VIEW COMPARISON:  Portable exam 0821 hours compared to 02/19/2021 FINDINGS: Enlargement of cardiac  silhouette with vascular congestion post CABG. Mediastinal contours normal. Atherosclerotic calcification aorta. Interstitial infiltrates in both lungs increased since previous exam favor pulmonary edema though multifocal infection not completely excluded. Tiny LEFT pleural effusion. No pneumothorax or acute osseous findings. IMPRESSION: Enlargement of cardiac silhouette with pulmonary vascular congestion post CABG. Diffuse infiltrates increased from prior exam favor pulmonary edema/CHF. Tiny LEFT pleural effusion. Aortic Atherosclerosis (ICD10-I70.0). Electronically Signed   By: Lavonia Dana M.D.   On: 02/21/2021 10:17   DG Chest Port 1 View  Result Date: 02/02/2021 CLINICAL DATA:  Acute respiratory failure EXAM: PORTABLE CHEST 1 VIEW COMPARISON:  Yesterday FINDINGS: Cardiomegaly. Prior CABG. Generous lung volumes. Diffuse interstitial opacity with cephalized blood flow and Kerley lines. The degree of opacity of is above prior baseline. Small bilateral pleural effusion. IMPRESSION: CHF pattern superimposed on a background of chronic lung disease. Electronically Signed   By: Monte Fantasia M.D.   On: 02/02/2021 11:15   DG Chest Port 1 View  Result Date: 02/01/2021 CLINICAL DATA:  Respiratory distress EXAM: PORTABLE CHEST 1 VIEW COMPARISON:  01/28/2021, 11/15/2020, 10/25/2020, 09/26/2019 FINDINGS: Post sternotomy changes. Diffuse bilateral interstitial opacity, some of which is felt secondary to chronic disease. There is slight increased ground-glass opacity bilaterally suspicious for superimposed acute edema or possible pneumonia.  Blunting at the left costophrenic sulcus probably chronic and due to scarring. Mild cardiomegaly with aortic atherosclerosis. No pneumothorax. IMPRESSION: Suspect some degree of chronic interstitial changes. However there is increased interstitial and ground-glass opacity bilaterally suspicious for superimposed acute edema or possible pneumonia. Mild cardiomegaly. Electronically  Signed   By: Donavan Foil M.D.   On: 02/01/2021 15:35   DG Chest Portable 1 View  Result Date: 01/28/2021 CLINICAL DATA:  Dyspnea EXAM: PORTABLE CHEST 1 VIEW COMPARISON:  11/15/2020 FINDINGS: The lungs are well expanded. Mild left-sided volume loss with associated left basilar pleural thickening is unchanged. Linear atelectasis within the left mid lung zone. Diffuse interstitial thickening again noted, unchanged. No superimposed focal pulmonary infiltrate. No pneumothorax or pleural effusion. Coronary artery bypass grafting has been performed. Cardiac size within normal limits. IMPRESSION: No active disease. Stable mild left-sided volume loss with left pleural thickening. Electronically Signed   By: Fidela Salisbury MD   On: 01/28/2021 04:11    Assessment & Plan     75 y.o.malewith history ofcoronary artery disease s/p CABG in 2006, history of HFrEF with most recent EF estimated between 50-55%, paroxsymal atrial fibrillation, anticoagulated with Eliquis, history of aortic valve stenosis, history of a CVA, chronic kidney disease, type 2 diabetes, hyperlipidemia, hypertension, and tobacco abusewho was admitted after presenting to the emergency room  1. Acute kidney. Injury-May be due to overdiuresis. Appreciate nephrology input. Creatinine slightly better.  2. Peripheral edema-chronic. May need Unna boots. Low-sodium diet. Keep feet elevated.  3. Chronic diastolic heart failure-no obvious pulmonary edema on chest x-ray but has peripheral edema.  Carefully hydrating.  Appreciate nephrology input.  4. Atrial fibrillation-slow ventricular response. This has been chronic. We will avoid rate limiting medications at present. At present. We will continue with anticoagulation. Marland Kitchen GFR 19 at present. Age less than 7 and weight greater than 60 kg.  Signed, Javier Docker Darnise Montag MD 02/21/2021, 4:54 PM  Pager: [phone]  Dr. Clayborn Bigness will be covering my patients this weekend.

## 2021-02-21 NOTE — Progress Notes (Signed)
Pt complaining of shortness of breath this morning.Labored breathing. O2 sats 93% on 3L. Md ordered 60mg  IV lasix. Lasix administered per order. Will continue to monitor.

## 2021-02-21 NOTE — Progress Notes (Signed)
Pt restless and SHob with labored breathing upon assessment this morning. O2 sat 93% on 3L. Pt stated that he "could not breathe. RT notified and pt was given a breathing tx. Breathing tx did not seem to help. O2 sats were still 93-94%. MD notiifies. MD ordered 60mg  IV lasix which was then administered. MD ordered CPAP for pt. Pt has been on CPAP most of this shift and has been tolerating well. PT states that he is much more comfortable with his breathing. Pt was asked sewveral times if he would like to take a break and go back on nasal cannula but he declined. Report given to [facility] RN at bedside.

## 2021-02-21 NOTE — Progress Notes (Signed)
OT Cancellation Note  Patient Details Name: [name] MRN: [id number] DOB: December 19, 1945   Cancelled Treatment:    Reason Eval/Treat Not Completed: Medical issues which prohibited therapy. Consult received, chart reviewed. Pt SOB this morning, also noted with critically elevated K+ 5.8. Contraindicated for therapy at this time. Will re-attempt at later date/time as medically appropriate.   Hanley Hays, MPH, MS, OTR/L ascom [phone] 02/21/21, 11:34 AM

## 2021-02-21 NOTE — Progress Notes (Signed)
Telemetry called to notify of pt HR 30's to 40', not sustained, then back up to 50's. MD notified. This known with this patient. No new orders except to continue to monitor.

## 2021-02-21 NOTE — TOC Initial Note (Signed)
Transition of Care [facility]) - Initial/Assessment Note    Patient Details  Name: [name] MRN: [id number] Date of Birth: Jun 23, 1946  Transition of Care [facility]) CM/SW Contact:    Kerin Salen, RN Phone Number: 02/21/2021, 1:51 PM  Clinical Narrative: Spoke with patient who is HOH, says he lives with this wife who assist with ADL's, does shopping and cooking and provides transportation to medical appointments. Patient states he uses [employer] 2l continuous and have [place] services.Could not remember name of agencies. Patient says he has, BSC, shower chair and cane. Use [employer] in [place] they deliver to the home. PCP is at the [facility] in [place], wife transport when scheduled. Will continue to track for [facility] discharge needs.               Expected Discharge Plan: [place] Barriers to Discharge: Continued Medical Work up   Patient Goals and CMS Choice Patient states their goals for this hospitalization and ongoing recovery are:: To return home with wife.   Choice offered to / list presented to : NA  Expected Discharge Plan and Services Expected Discharge Plan: [place] In-house Referral: Clinical Social Work     Living arrangements for the past 2 months: Single Family Home                 DME Arranged: N/A DME Agency: NA       HH Arranged: NA [place] Agency: NA        Prior Living Arrangements/Services Living arrangements for the past 2 months: [place] Lives with:: Spouse Patient language and need for interpreter reviewed:: Yes Do you feel safe going back to the place where you live?: Yes      Need for Family Participation in Patient Care: Yes (Comment) Care giver support system in place?: Yes (comment) Current home services: Home PT Criminal Activity/Legal Involvement Pertinent to Current Situation/Hospitalization: No - Comment as needed  Activities of Daily Living Home Assistive Devices/Equipment: Cane (specify quad or  straight) ADL Screening (condition at time of admission) Patient's cognitive ability adequate to safely complete daily activities?: Yes Is the patient deaf or have difficulty hearing?: Yes Does the patient have difficulty seeing, even when wearing glasses/contacts?: No Does the patient have difficulty concentrating, remembering, or making decisions?: No Patient able to express need for assistance with ADLs?: Yes Does the patient have difficulty dressing or bathing?: Yes Independently performs ADLs?: No Communication: Independent Dressing (OT): Needs assistance Grooming: Needs assistance Feeding: Independent Bathing: Needs assistance Toileting: Needs assistance In/Out Bed: Dependent Walks in Home: Dependent Does the patient have difficulty walking or climbing stairs?: Yes Weakness of Legs: Both Weakness of Arms/Hands: Both  Permission Sought/Granted                  Emotional Assessment Appearance:: Appears stated age Attitude/Demeanor/Rapport: Guarded Affect (typically observed): Flat Orientation: : Oriented to Self,Oriented to Place Alcohol / Substance Use: Not Applicable Psych Involvement: No (comment)  Admission diagnosis:  Bradycardia [R00.1] 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 Pressure injury of skin 02/20/2021  . AKI (acute kidney injury) ([place]) 02/19/2021  . Type 2 diabetes mellitus with hypoglycemia without coma ([place]) 02/19/2021  . Bradycardia 02/19/2021  . Weakness generalized 02/19/2021  . Dental infection 02/19/2021  . Acute respiratory failure ([place]) 01/28/2021  . Acute renal failure superimposed on stage 3b chronic kidney  disease ([place]) 01/28/2021  . Atrial fibrillation, chronic ([place]) 01/28/2021  . Acute CHF (congestive heart failure) ([place]) 11/15/2020  . Chronic diastolic CHF (congestive heart failure) ([place]) 10/24/2020  . Elevated  troponin 10/24/2020  . CKD (chronic kidney disease), stage IV ([place]) 10/24/2020  . Malnutrition of moderate degree 12/27/2019  . Hypertensive emergency 12/26/2019  . Hyperglycemia due to type 2 diabetes mellitus ([place]) 12/26/2019  . Chronic kidney disease (CKD) stage G3b/A3, moderately decreased glomerular filtration rate (GFR) between 30-44 mL/min/1.73 square meter and albuminuria creatinine ratio greater than 300 mg/g (HCC) 12/26/2019  . History of COVID-19 12/26/2019  . History of CVA (cerebrovascular accident) 12/26/2019  . 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 History of second degree heart block 12/26/2019  . Elevated LFTs 12/26/2019  . Acute systolic CHF (congestive heart failure) ([place]) 10/11/2019  . Malignant neoplasm of prostate ([place]) 11/24/2016  . Bright red rectal bleeding 10/02/2016  . COPD with acute exacerbation ([place]) 01/17/2016  . Chronic systolic heart failure ([place]) 12/12/2015  . Essential hypertension 12/12/2015  . COPD (chronic obstructive pulmonary disease) with chronic bronchitis ([place]) 12/12/2015  . Diabetes ([place]) 12/12/2015  . Tobacco abuse 12/12/2015  . Acute respiratory failure with hypoxia ([place]) 12/03/2015   PCP:  [name], MD Pharmacy:   [facility] [address] [place] [address] Phone: [phone] Fax: [phone]     Social Determinants of Health (SDOH) Interventions    Readmission Risk Interventions Readmission Risk Prevention Plan 02/04/2021 10/28/2020 12/27/2019  Transportation Screening Complete Complete Complete  PCP or Specialist Appt within 3-5 Days - - Complete  HRI or [place] - - Complete  Medication Review ([place]) Complete Complete Complete  PCP or Specialist appointment within 3-5 days of discharge Complete Complete -  [place] or Home Care Consult Complete Complete -  SW Recovery Care/Counseling Consult Complete - -  Palliative Care Screening Not Applicable Not Applicable -   [place] Not Applicable Not Applicable -  Some recent data might be hidden

## 2021-02-21 NOTE — Progress Notes (Signed)
Mobility Specialist - Progress Note   02/21/21 1000  Mobility  Activity Contraindicated/medical hold  Mobility performed by Mobility specialist    Per chart review, pt with elevated K+ levels of 5.8 this date, contraindicating mobility. Will attempt session another date/time as medically appropriate.    Kathee Delton Mobility Specialist 02/21/21, 10:35 AM

## 2021-02-22 ENCOUNTER — Inpatient Hospital Stay: Payer: PPO

## 2021-02-22 DIAGNOSIS — N179 Acute kidney failure, unspecified: Secondary | ICD-10-CM | POA: Diagnosis not present

## 2021-02-22 LAB — CBC WITH DIFFERENTIAL/PLATELET
Abs Immature Granulocytes: 0.04 10*3/uL (ref 0.00–0.07)
Basophils Absolute: 0 10*3/uL (ref 0.0–0.1)
Basophils Relative: 0 %
Eosinophils Absolute: 0.1 10*3/uL (ref 0.0–0.5)
Eosinophils Relative: 1 %
HCT: 24.7 % — ABNORMAL LOW (ref 39.0–52.0)
Hemoglobin: 7 g/dL — ABNORMAL LOW (ref 13.0–17.0)
Immature Granulocytes: 1 %
Lymphocytes Relative: 12 %
Lymphs Abs: 0.8 10*3/uL (ref 0.7–4.0)
MCH: 23.2 pg — ABNORMAL LOW (ref 26.0–34.0)
MCHC: 28.3 g/dL — ABNORMAL LOW (ref 30.0–36.0)
MCV: 81.8 fL (ref 80.0–100.0)
Monocytes Absolute: 0.6 10*3/uL (ref 0.1–1.0)
Monocytes Relative: 8 %
Neutro Abs: 5.2 10*3/uL (ref 1.7–7.7)
Neutrophils Relative %: 78 %
Platelets: 259 10*3/uL (ref 150–400)
RBC: 3.02 MIL/uL — ABNORMAL LOW (ref 4.22–5.81)
RDW: 21.1 % — ABNORMAL HIGH (ref 11.5–15.5)
Smear Review: NORMAL
WBC: 6.7 10*3/uL (ref 4.0–10.5)
nRBC: 0 % (ref 0.0–0.2)

## 2021-02-22 LAB — PROTEIN ELECTROPHORESIS, SERUM
A/G Ratio: 1.2 (ref 0.7–1.7)
Albumin ELP: 2.2 g/dL — ABNORMAL LOW (ref 2.9–4.4)
Alpha-1-Globulin: 0.2 g/dL (ref 0.0–0.4)
Alpha-2-Globulin: 0.7 g/dL (ref 0.4–1.0)
Beta Globulin: 0.6 g/dL — ABNORMAL LOW (ref 0.7–1.3)
Gamma Globulin: 0.3 g/dL — ABNORMAL LOW (ref 0.4–1.8)
Globulin, Total: 1.8 g/dL — ABNORMAL LOW (ref 2.2–3.9)
Total Protein ELP: 4 g/dL — ABNORMAL LOW (ref 6.0–8.5)

## 2021-02-22 LAB — BASIC METABOLIC PANEL
Anion gap: 11 (ref 5–15)
BUN: 59 mg/dL — ABNORMAL HIGH (ref 8–23)
CO2: 21 mmol/L — ABNORMAL LOW (ref 22–32)
Calcium: 8 mg/dL — ABNORMAL LOW (ref 8.9–10.3)
Chloride: 104 mmol/L (ref 98–111)
Creatinine, Ser: 3 mg/dL — ABNORMAL HIGH (ref 0.61–1.24)
GFR, Estimated: 21 mL/min — ABNORMAL LOW (ref >60)
Glucose, Bld: 105 mg/dL — ABNORMAL HIGH (ref 70–99)
Potassium: 5.1 mmol/L (ref 3.5–5.1)
Sodium: 136 mmol/L (ref 135–145)

## 2021-02-22 LAB — PROTEIN ELECTRO, RANDOM URINE
Albumin ELP, Urine: 47.3 %
Alpha-1-Globulin, U: 8.1 %
Alpha-2-Globulin, U: 10.7 %
Beta Globulin, U: 14.7 %
Gamma Globulin, U: 19.3 %
Total Protein, Urine: 10.5 mg/dL

## 2021-02-22 LAB — PREPARE RBC (CROSSMATCH)

## 2021-02-22 LAB — GLUCOSE, CAPILLARY
Glucose-Capillary: 119 mg/dL — ABNORMAL HIGH (ref 70–99)
Glucose-Capillary: 154 mg/dL — ABNORMAL HIGH (ref 70–99)
Glucose-Capillary: 178 mg/dL — ABNORMAL HIGH (ref 70–99)
Glucose-Capillary: 148 mg/dL — ABNORMAL HIGH (ref 70–99)

## 2021-02-22 LAB — HEMOGLOBIN: Hemoglobin: 8.3 g/dL — ABNORMAL LOW (ref 13.0–17.0)

## 2021-02-22 MED ORDER — SODIUM ZIRCONIUM CYCLOSILICATE 10 G PO PACK
10.0000 g | PACK | Freq: Once | ORAL | Status: AC
  Administered 2021-02-22: 10 g via ORAL
  Filled 2021-02-22: qty 1

## 2021-02-22 MED ORDER — FUROSEMIDE 10 MG/ML IJ SOLN
40.0000 mg | Freq: Once | INTRAMUSCULAR | Status: AC
  Administered 2021-02-22: 40 mg via INTRAVENOUS
  Filled 2021-02-22: qty 4

## 2021-02-22 MED ORDER — ALBUMIN HUMAN 25 % IV SOLN
25.0000 g | Freq: Once | INTRAVENOUS | Status: AC
  Administered 2021-02-22: 25 g via INTRAVENOUS
  Filled 2021-02-22: qty 100

## 2021-02-22 MED ORDER — SODIUM CHLORIDE 0.9% IV SOLUTION: Freq: Once | INTRAVENOUS | Status: DC

## 2021-02-22 MED ORDER — FUROSEMIDE 10 MG/ML IJ SOLN
6.0000 mg/h | INTRAVENOUS | Status: DC
  Administered 2021-02-22 – 2021-02-24 (×2): 4 mg/h via INTRAVENOUS
  Administered 2021-02-26 – 2021-02-27 (×2): 6 mg/h via INTRAVENOUS
  Filled 2021-02-22 (×4): qty 20

## 2021-02-22 NOTE — Progress Notes (Signed)
PROGRESS NOTE    [name]  JSE:[id number] DOB: Mar 29, 1946 DOA: 02/19/2021 PCP: [name], MD   Brief Narrative:  [name] is a 75 y.o. 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 Patient states that he has had weakness for the last 3 days which has made it difficult for him to ambulate.  This morning he states he was unable to get off the toilet and actually had a fall this morning. He denies feeling dizzy or lightheaded and complains of just generalized weakness. He denies having any chest pain, no shortness of breath, no fever, no chills, no headache, no abdominal pain, no nausea, no vomiting, no diarrhea, no urinary symptoms, no blurred vision, no difficulty swallowing, no headache.  Found to be bradycardic, hypoglycemic, borderline hypotensive.  Found to have significant AKI thought to be due to overdiuresis.  Case discussed with cardiology and nephrology.  Recommendations appreciated.  4/21: This morning patient was complaining of worsening shortness of breath.  Stat chest x-ray demonstrates fluid overload.  Administered 60 mg IV Lasix.  CPAP ordered.  4/22: Patient still endorsing shortness of breath although improved from prior.  Tolerated BiPAP okay.  Does better with it on.   Assessment & Plan:   Principal Problem:   AKI (acute kidney injury) ([place]) Active Problems:   Chronic kidney disease (CKD) stage G3b/A3, moderately decreased glomerular filtration rate (GFR) between 30-44 mL/min/1.73 square meter and albuminuria creatinine ratio greater than 300 mg/g (HCC)   History of CVA (cerebrovascular accident)   Chronic diastolic CHF (congestive heart failure) (HCC)   Atrial fibrillation, chronic (HCC)    Type 2 diabetes mellitus with hypoglycemia without coma (HCC)   Bradycardia   Weakness generalized   Dental infection   Pressure injury of skin  Acute kidney injury on chronic kidney disease stage IIIb Hyperkalemia Hyponatremia Thought to be secondary to overdiuresis Developed edema on chest x-ray At baseline patient has a serum creatinine of 1.95, on admission his creatinine is 3.95 [name] [facility] consulted Plan: No further IV fluids at this time.  Patient highly sensitive to IVF and develops pulmonary edema very rapidly Lasix 40mg  IV x1 before transfusion Careful management of volume status Daily BMP  Acute on chronic anemia Hb drifting down to 7 Patient with cardiac history Ideal Hb goal 8 Plan: Transfuse 1 unit PRBC slowly over 3 hours Continue Eliquis Daily CBC  Atrial fibrillation with slow ventricular response Chronic issue per Dr. Ubaldo Glassing who the patient is well-known to Plan: Avoid AV nodal blocking agents Continue apixaban for anticoagulation, may need dose reduction if kidney function does not recover  Diabetes mellitus with complications of stage III chronic kidney disease and hypoglycemia Hold insulin Blood sugar checks before meals and at bedtime Encourage oral intake   COPD with acute on chronic respiratory failure Not acutely exacerbated Continue Spiriva, as needed bronchodilator therapy and inhaled steroids Wean oxygen as tolerated   Acute on chronic diastolic congestive heart failure Exacerbation likely driven by IV volume resuscitation Diuresis 40mg  IV lasix x 1 Holding home diuretics   Coronary artery disease Stable and not acutely exacerbated Continue statins and aspirin Hold nitrates    Generalized weakness/fall  Multifactorial and related to bradycardia as well as hypotension Place patient on fall precautions Therapy  evals when able    Dental infection Patient noted to have purulence around his gum  line Cervical spine CT shows an expansile lucent lesion with pathologic fracture  identified involving the midline the maxilla. Recommend further evaluation with dedicated maxillofacial CT with contrast. ENT was consulted in the ER and he recommends to obtain maxillofacial CT with contrast once renal function improves Continue Unasyn for now   DVT prophylaxis: Eliquis Code Status: Full Family Communication: None today Disposition Plan: Status is: Inpatient  Remains inpatient appropriate because:Inpatient level of care appropriate due to severity of illness   Dispo: The patient is from: Home              Anticipated d/c is to: Home              Patient currently is not medically stable to d/c.   Difficult to place patient No  AKI on CKD stage IIIb and atrial fibrillation with slow ventricular response.     Level of care: Progressive Cardiac  Consultants:  Nephrology Cardiology   Procedures:None  Antimicrobials:   Unasyn   Subjective: Seen and examined.  Still feels poorly.  Breathing a little better than yesterday  Objective: Vitals:   02/22/21 0959 02/22/21 1027 02/22/21 1211 02/22/21 1214  BP: (!) 109/56 (!) 127/114 (!) 127/59 (!) 127/59  Pulse: (!) 57 (!) 51 (!) 55 (!) 55  Resp: 20 20 19 20   Temp: 98.6 F (37 C) 98.4 F (36.9 C) 99 F (37.2 C) 99.1 F (37.3 C)  TempSrc: Axillary Axillary Axillary Oral  SpO2: 97% 96% 99% 94%  Weight:      Height:        Intake/Output Summary (Last 24 hours) at 02/22/2021 1324 Last data filed at 02/22/2021 1215 Gross per 24 hour  Intake 335 ml  Output 1800 ml  Net -1465 ml   Filed Weights   02/19/21 0836 02/22/21 0418  Weight: 74 kg 81.3 kg    Examination:  General exam: No acute distress.  Appears fatigued Respiratory system: Increased WOB.  Scattered crackles.  3L cardiovascular system: S1-S2, bradycardic, no appreciable murmurs, trace np edema BL Gastrointestinal system: Abdomen is nondistended, soft and  nontender. No organomegaly or masses felt. Normal bowel sounds heard. Central nervous system: Alert, oriented x2, no focal deficits, lethargic extremities: Symmetric 5 x 5 power. Skin: No rashes, lesions or ulcers Psychiatry: Judgement and insight appear impaired. Mood & affect flattened.     Data Reviewed: I have personally reviewed following labs and imaging studies  CBC: Recent Labs  Lab 02/19/21 0849 02/20/21 0755 02/21/21 0527 02/22/21 0447  WBC 10.0 8.1 8.7 6.7  NEUTROABS 8.6* 6.3 7.0 5.2  HGB 8.1* 7.7* 7.6* 7.0*  HCT 26.2* 24.7* 25.7* 24.7*  MCV 78.0* 76.5* 78.6* 81.8  PLT 276 266 275 099   Basic Metabolic Panel: Recent Labs  Lab 02/19/21 0849 02/20/21 0454 02/20/21 0755 02/21/21 0527 02/22/21 0447  NA 133*  --  133* 133* 136  K 4.6  --  5.8* 5.8* 5.1  CL 103  --  100 102 104  CO2 20*  --  23 22 21*  GLUCOSE 48*  --  74 163* 105*  BUN 70*  --  68* 65* 59*  CREATININE 3.95* 3.63* 3.65* 3.33* 3.00*  CALCIUM 7.5*  --  7.9* 8.0* 8.0*   GFR: Estimated Creatinine Clearance: 21.3 mL/min (A) (by C-G formula based on SCr of 3 mg/dL (H)). Liver Function Tests: Recent Labs  Lab 02/19/21 [phone]  AST 15  ALT 12  ALKPHOS 102  BILITOT 0.7  PROT 5.6*  ALBUMIN 2.8*   No results for input(s): LIPASE, AMYLASE in the last 168 hours. No results for input(s): AMMONIA in the last 168 hours. Coagulation Profile: No results for input(s): INR, PROTIME in the last 168 hours. Cardiac Enzymes: Recent Labs  Lab 02/19/21 1056  CKTOTAL 34*   BNP (last 3 results) No results for input(s): PROBNP in the last 8760 hours. HbA1C: No results for input(s): HGBA1C in the last 72 hours. CBG: Recent Labs  Lab 02/21/21 1148 02/21/21 1639 02/21/21 2010 02/22/21 0801 02/22/21 1217  GLUCAP 201* 124* 116* 119* 154*   Lipid Profile: No results for input(s): CHOL, HDL, LDLCALC, TRIG, CHOLHDL, LDLDIRECT in the last 72 hours. Thyroid Function Tests: No results for input(s): TSH,  T4TOTAL, FREET4, T3FREE, THYROIDAB in the last 72 hours. Anemia Panel: No results for input(s): VITAMINB12, FOLATE, FERRITIN, TIBC, IRON, RETICCTPCT in the last 72 hours. Sepsis Labs: No results for input(s): PROCALCITON, LATICACIDVEN in the last 168 hours.  Recent Results (from the past 240 hour(s))  Resp Panel by RT-PCR (Flu A&B, Covid) Nasopharyngeal Swab     Status: None   Collection Time: 02/19/21  9:56 AM   Specimen: Nasopharyngeal Swab; Nasopharyngeal(NP) swabs in vial transport medium  Result Value Ref Range Status   SARS Coronavirus 2 by RT PCR NEGATIVE NEGATIVE Final    Comment: (NOTE) SARS-CoV-2 target nucleic acids are NOT DETECTED.  The SARS-CoV-2 RNA is generally detectable in upper respiratory specimens during the acute phase of infection. The lowest concentration of SARS-CoV-2 viral copies this assay can detect is 138 copies/mL. A negative result does not preclude SARS-Cov-2 infection and should not be used as the sole basis for treatment or other patient management decisions. A negative result may occur with  improper specimen collection/handling, submission of specimen other than nasopharyngeal swab, presence of viral mutation(s) within the areas targeted by this assay, and inadequate number of viral copies(<138 copies/mL). A negative result must be combined with clinical observations, patient history, and epidemiological information. The expected result is Negative.  Fact Sheet for Patients:  EntrepreneurPulse.com.au  Fact Sheet for Healthcare Providers:  IncredibleEmployment.be  This test is no t yet approved or cleared by the [place] FDA and  has been authorized for detection and/or diagnosis of SARS-CoV-2 by FDA under an Emergency Use Authorization (EUA). This EUA will remain  in effect (meaning this test can be used) for the duration of the COVID-19 declaration under Section 564(b)(1) of the Act, 21 U.S.C.section  360bbb-3(b)(1), unless the authorization is terminated  or revoked sooner.       Influenza A by PCR NEGATIVE NEGATIVE Final   Influenza B by PCR NEGATIVE NEGATIVE Final    Comment: (NOTE) The Xpert Xpress SARS-CoV-2/FLU/RSV plus assay is intended as an aid in the diagnosis of influenza from Nasopharyngeal swab specimens and should not be used as a sole basis for treatment. Nasal washings and aspirates are unacceptable for Xpert Xpress SARS-CoV-2/FLU/RSV testing.  Fact Sheet for Patients: EntrepreneurPulse.com.au  Fact Sheet for Healthcare Providers: IncredibleEmployment.be  This test is not yet approved or cleared by the [place] FDA and has been authorized for detection and/or diagnosis of SARS-CoV-2 by FDA under an Emergency Use Authorization (EUA). This EUA will remain in effect (meaning this test can be used) for the duration of the COVID-19 declaration under Section 564(b)(1) of the Act, 21 U.S.C. section 360bbb-3(b)(1), unless the authorization is terminated or revoked.  Performed at [facility], [address] [place], [place] [address]          Radiology Studies: DG Chest [place] 1 View  Result Date: 02/22/2021 CLINICAL DATA:  Pulmonary edema EXAM: PORTABLE CHEST 1 VIEW COMPARISON:  Yesterday FINDINGS: Generalized interstitial opacity with small pleural effusions. Stable lung volumes with diaphragm flattening. Cardiomegaly. Prior CABG. No pneumothorax IMPRESSION: 1. Unchanged CHF pattern. 2. COPD Electronically Signed   By: Monte Fantasia M.D.   On: 02/22/2021 04:46   DG Chest [place] 1 View  Result Date: 02/21/2021 CLINICAL DATA:  Shortness of breath, weakness, history COPD, hypertension, diabetes mellitus, stroke, atrial fibrillation, smoker, CHF, stage III chronic kidney disease EXAM: PORTABLE CHEST 1 VIEW COMPARISON:  Portable exam 0821 hours compared to 02/19/2021 FINDINGS: Enlargement of cardiac silhouette with  vascular congestion post CABG. Mediastinal contours normal. Atherosclerotic calcification aorta. Interstitial infiltrates in both lungs increased since previous exam favor pulmonary edema though multifocal infection not completely excluded. Tiny LEFT pleural effusion. No pneumothorax or acute osseous findings. IMPRESSION: Enlargement of cardiac silhouette with pulmonary vascular congestion post CABG. Diffuse infiltrates increased from prior exam favor pulmonary edema/CHF. Tiny LEFT pleural effusion. Aortic Atherosclerosis (ICD10-I70.0). Electronically Signed   By: Lavonia Dana M.D.   On: 02/21/2021 10:17        Scheduled Meds: . sodium chloride   Intravenous Once  . acidophilus  1 capsule Oral Daily  . apixaban  5 mg Oral BID  . cholecalciferol  1,000 Units Oral Daily  . gabapentin  100 mg Oral BID  . hydrocortisone  25 mg Rectal BID  . insulin aspart  0-15 Units Subcutaneous TID WC  . iron polysaccharides  150 mg Oral Daily  . magnesium oxide  400 mg Oral Daily  . multivitamin-lutein  1 capsule Oral Daily  . nicotine  21 mg Transdermal Daily  . pantoprazole  40 mg Oral Daily  . Ensure Max Protein  11 oz Oral BID BM  . simvastatin  20 mg Oral q AM  . tiotropium  18 mcg Inhalation Daily  . traZODone  50 mg Oral QHS   Continuous Infusions: . albumin human    . ampicillin-sulbactam (UNASYN) IV 3 g (02/21/21 2333)     LOS: 3 days    Time spent: 25 minutes    Sidney Ace, MD [facility] Pager [phone]  If 7PM-7AM, please contact night-coverage 02/22/2021, 1:24 PM

## 2021-02-22 NOTE — Care Management Important Message (Signed)
Important Message  Patient Details  Name: [name] MRN: [id number] Date of Birth: 03-07-1946   Medicare Important Message Given:  Yes     Dannette Barbara 02/22/2021, 12:00 PM

## 2021-02-22 NOTE — Consult Note (Signed)
Pharmacy Antibiotic Note  Aztlan Coll is a 75 y.o. male admitted on 02/19/2021 with a dental abscess found on CT. CT shows an expansile lucent lesion with pathologic fracture identified involving the midline the maxilla. Patient has some purulence along his gumline. Maxillofacial CT with contrast planned once renal function improves. Pharmacy has been consulted for Uansyn dosing.  Plan: Day 4 of abx. Continue Unasyn 3 g q12h Monitor renal fxn  F/u duration of therapy (7-14 days for odontogenic infections)     Height: 5\' 6"  (167.6 cm) Weight: 81.3 kg (179 lb 3.2 oz) IBW/kg (Calculated) : 63.8  Temp (24hrs), Avg:98.2 F (36.8 C), Min:97.8 F (36.6 C), Max:98.6 F (37 C)  Recent Labs  Lab 02/19/21 0849 02/20/21 0454 02/20/21 0755 02/21/21 0527 02/22/21 0447  WBC 10.0  --  8.1 8.7 6.7  CREATININE 3.95* 3.63* 3.65* 3.33* 3.00*    Estimated Creatinine Clearance: 21.3 mL/min (A) (by C-G formula based on SCr of 3 mg/dL (H)).    Allergies  Allergen Reactions  . Lactose Intolerance (Gi)     Antimicrobials this admission: 4/19 unasyn >>   Microbiology results: No cultures collected at this time  Thank you for allowing pharmacy to be a part of this patient's care.  Doreatha Massed, Student-PharmD 02/22/2021 10:12 AM

## 2021-02-22 NOTE — Progress Notes (Signed)
PT Cancellation Note  Patient Details Name: [name] MRN: [id number] DOB: 1946/10/20   Cancelled Treatment:    Reason Eval/Treat Not Completed: Patient not medically ready. Patient receiving blood transfusion currently, he is also still on bipap due to breathing difficulties. Will continue to follow and see when appropriate.     [name] 02/22/2021, 11:46 AM

## 2021-02-22 NOTE — Consult Note (Signed)
   Heart Failure Nurse Navigator Note  HFpEF 55-60%  He presented to the emergency room with complaints of progressive weakness and a fall.  Comorbidities:  Atrial fibrillation COPD Coronary artery disease Diabetes GERD Hyperlipidemia Hypertension  Medications:  Albumin 25 g IV Apixaban 5 mg 2 times a day Furosemide infusion at 4 mg an hour Zocor 20 mg every evening  Labs:  Sodium 136, potassium 5.1, chloride 104, CO2 21, BUN 58, creatinine 2.0 Intake not documented Output 1350 mL Weight is 81.3 kg Blood pressure 126/58   Assessment:  General- he is awake and alert sitting up just finishing lunch.  Wife is at the bedside  HEENT-pupils are equal, normocephalic  Cardiac- heart tones are slightly irregular.  Chest-  Abdomen-soft non tender  Musculoskeletal-lower extremities skin is erythemic and scaly.  Trace edema.  Psych-is pleasant and appropriate, makes good eye contact  Neurologic- speech is clear.   Initial meeting with patient and wife on this admission.  He had been seen at the heart failure clinic on April 15, was felt to be euvolemic at that time.  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  Questioned he states that he just went downhill after that appointment, swelling in the legs got worse along with worsening of the weeping.  He states that he was unable to stand on the scale due to weakness and the weeping of his legs.  Wife states that she does have a scale working now, discussed when he would call physician for weight gain by teach back method.  He was able to tell me 5 pounds in a week but was unsure about the 2 to 3 pounds overnight.  They state they have been compliant with reading labels and not eating processed dinners.  He states that he has not smoked since before his last admission in March.  Congratulated him on that that is a  difficult thing to do.  Will continue to follow along they had no further questions today.  Pricilla Riffle RN [facility]

## 2021-02-22 NOTE — Progress Notes (Signed)
OT Cancellation Note  Patient Details Name: [name] MRN: [id number] DOB: [birth year]   Cancelled Treatment:    Reason Eval/Treat Not Completed: Other (comment). Consult received, chart reviewed. Pt noted with Hgb 7.0 and currently receiving blood transfusion. Will hold OT evaluation at this time and re-attempt at later date/time once transfusion is completed and pt available and medically appropriate.   Hanley Hays, MPH, MS, OTR/L ascom [phone] 02/22/21, 10:23 AM

## 2021-02-22 NOTE — Progress Notes (Signed)
Pt very restless. Did not sleep all night. Pt still SOB. BLE and BUE elevated with pillows. Pt tolerating CPAP. APP notified. CXR was ordered. No change. All needs attended. Call light is within reach. Safety measures maintained.

## 2021-02-22 NOTE — Progress Notes (Addendum)
[facility]  ROUNDING NOTE   Subjective:   [name] is a 75 y.o.male with past medical history of chronic Atrial Fibrillation, CHF, diabetes, CKD stage 3, GERD and hypertension. He presents to the ED with generalized weakness for 3 days.   We were consulted for evaluate of acute kidney injury. Baseline Creatinine of 1.95, BUN 70, and EGFR 15 on admission.  Patient seen laying in bed on CPAP Seem anxious and uncomfortable Poor appetite, denies nausea    Objective:  Vital signs in last 24 hours:  Temp:  [97.8 F (36.6 C)-99.1 F (37.3 C)] 98.9 F (37.2 C) (04/22 1327) Pulse Rate:  [49-74] 50 (04/22 1327) Resp:  [16-20] 20 (04/22 1327) BP: (109-127)/(56-114) 126/58 (04/22 1327) SpO2:  [88 %-100 %] 95 % (04/22 1327) Weight:  [81.3 kg] 81.3 kg (04/22 0418)  Weight change:  Filed Weights   02/19/21 0836 02/22/21 0418  Weight: 74 kg 81.3 kg    Intake/Output: I/O last 3 completed shifts: In: 0  Out: 1350 [Urine:1350]   Intake/Output this shift:  Total I/O In: 825 [I.V.:100; Blood:725] Out: 700 [Urine:700]  Physical Exam: General: NAD, anxious  Head: Normocephalic, atraumatic. Moist oral mucosal membranes  Eyes: Anicteric  Neck: Supple, trachea midline  Lungs:  Diminished in bases, tachypnea, CPAP  Heart: Regular rate and rhythm  Abdomen:  Soft, nontender,   Extremities:  1+ peripheral edema.  Neurologic: Nonfocal, moving all four extremities  Skin: No lesions, red flaky BLE       Basic Metabolic Panel: Recent Labs  Lab 02/19/21 0849 02/20/21 0454 02/20/21 0755 02/21/21 0527 02/22/21 0447  NA 133*  --  133* 133* 136  K 4.6  --  5.8* 5.8* 5.1  CL 103  --  100 102 104  CO2 20*  --  23 22 21*  GLUCOSE 48*  --  74 163* 105*  BUN 70*  --  68* 65* 59*  CREATININE 3.95* 3.63* 3.65* 3.33* 3.00*  CALCIUM 7.5*  --  7.9* 8.0* 8.0*    Liver Function Tests: Recent Labs  Lab 02/19/21 0849  AST 15  ALT 12  ALKPHOS 102  BILITOT 0.7  PROT 5.6*   ALBUMIN 2.8*   No results for input(s): LIPASE, AMYLASE in the last 168 hours. No results for input(s): AMMONIA in the last 168 hours.  CBC: Recent Labs  Lab 02/19/21 0849 02/20/21 0755 02/21/21 0527 02/22/21 0447  WBC 10.0 8.1 8.7 6.7  NEUTROABS 8.6* 6.3 7.0 5.2  HGB 8.1* 7.7* 7.6* 7.0*  HCT 26.2* 24.7* 25.7* 24.7*  MCV 78.0* 76.5* 78.6* 81.8  PLT 276 266 275 259    Cardiac Enzymes: Recent Labs  Lab 02/19/21 1056  CKTOTAL 34*    BNP: Invalid input(s): POCBNP  CBG: Recent Labs  Lab 02/21/21 1148 02/21/21 1639 02/21/21 2010 02/22/21 0801 02/22/21 1217  GLUCAP 201* 124* 116* 119* 154*    Microbiology: Results for orders placed or performed during the hospital encounter of 02/19/21  Resp Panel by RT-PCR (Flu A&B, Covid) Nasopharyngeal Swab     Status: None   Collection Time: 02/19/21  9:56 AM   Specimen: Nasopharyngeal Swab; Nasopharyngeal(NP) swabs in vial transport medium  Result Value Ref Range Status   SARS Coronavirus 2 by RT PCR NEGATIVE NEGATIVE Final    Comment: (NOTE) SARS-CoV-2 target nucleic acids are NOT DETECTED.  The SARS-CoV-2 RNA is generally detectable in upper respiratory specimens during the acute phase of infection. The lowest concentration of SARS-CoV-2 viral copies this  assay can detect is 138 copies/mL. A negative result does not preclude SARS-Cov-2 infection and should not be used as the sole basis for treatment or other patient management decisions. A negative result may occur with  improper specimen collection/handling, submission of specimen other than nasopharyngeal swab, presence of viral mutation(s) within the areas targeted by this assay, and inadequate number of viral copies(<138 copies/mL). A negative result must be combined with clinical observations, patient history, and epidemiological information. The expected result is Negative.  Fact Sheet for Patients:  EntrepreneurPulse.com.au  Fact Sheet for  Healthcare Providers:  IncredibleEmployment.be  This test is no t yet approved or cleared by the [place] FDA and  has been authorized for detection and/or diagnosis of SARS-CoV-2 by FDA under an Emergency Use Authorization (EUA). This EUA will remain  in effect (meaning this test can be used) for the duration of the COVID-19 declaration under Section 564(b)(1) of the Act, 21 U.S.C.section 360bbb-3(b)(1), unless the authorization is terminated  or revoked sooner.       Influenza A by PCR NEGATIVE NEGATIVE Final   Influenza B by PCR NEGATIVE NEGATIVE Final    Comment: (NOTE) The Xpert Xpress SARS-CoV-2/FLU/RSV plus assay is intended as an aid in the diagnosis of influenza from Nasopharyngeal swab specimens and should not be used as a sole basis for treatment. Nasal washings and aspirates are unacceptable for Xpert Xpress SARS-CoV-2/FLU/RSV testing.  Fact Sheet for Patients: EntrepreneurPulse.com.au  Fact Sheet for Healthcare Providers: IncredibleEmployment.be  This test is not yet approved or cleared by the [place] FDA and has been authorized for detection and/or diagnosis of SARS-CoV-2 by FDA under an Emergency Use Authorization (EUA). This EUA will remain in effect (meaning this test can be used) for the duration of the COVID-19 declaration under Section 564(b)(1) of the Act, 21 U.S.C. section 360bbb-3(b)(1), unless the authorization is terminated or revoked.  Performed at [facility], [address] [place], [place] [address]     Coagulation Studies: No results for input(s): LABPROT, INR in the last 72 hours.  Urinalysis: Recent Labs    02/20/21 1840  COLORURINE YELLOW*  LABSPEC 1.011  PHURINE 5.0  GLUCOSEU NEGATIVE  HGBUR NEGATIVE  BILIRUBINUR NEGATIVE  KETONESUR NEGATIVE  PROTEINUR NEGATIVE  NITRITE NEGATIVE  LEUKOCYTESUR NEGATIVE      Imaging: DG Chest Port 1 View  Result  Date: 02/22/2021 CLINICAL DATA:  Pulmonary edema EXAM: PORTABLE CHEST 1 VIEW COMPARISON:  Yesterday FINDINGS: Generalized interstitial opacity with small pleural effusions. Stable lung volumes with diaphragm flattening. Cardiomegaly. Prior CABG. No pneumothorax IMPRESSION: 1. Unchanged CHF pattern. 2. COPD Electronically Signed   By: Monte Fantasia M.D.   On: 02/22/2021 04:46   DG Chest Port 1 View  Result Date: 02/21/2021 CLINICAL DATA:  Shortness of breath, weakness, history COPD, hypertension, diabetes mellitus, stroke, atrial fibrillation, smoker, CHF, stage III chronic kidney disease EXAM: PORTABLE CHEST 1 VIEW COMPARISON:  Portable exam 0821 hours compared to 02/19/2021 FINDINGS: Enlargement of cardiac silhouette with vascular congestion post CABG. Mediastinal contours normal. Atherosclerotic calcification aorta. Interstitial infiltrates in both lungs increased since previous exam favor pulmonary edema though multifocal infection not completely excluded. Tiny LEFT pleural effusion. No pneumothorax or acute osseous findings. IMPRESSION: Enlargement of cardiac silhouette with pulmonary vascular congestion post CABG. Diffuse infiltrates increased from prior exam favor pulmonary edema/CHF. Tiny LEFT pleural effusion. Aortic Atherosclerosis (ICD10-I70.0). Electronically Signed   By: Lavonia Dana M.D.   On: 02/21/2021 10:17     Medications:   .  ampicillin-sulbactam (UNASYN) IV 3 g (02/21/21 2333)  . furosemide (LASIX) 200 mg in dextrose 5% 100 mL (70m/mL) infusion     . sodium chloride   Intravenous Once  . acidophilus  1 capsule Oral Daily  . apixaban  5 mg Oral BID  . cholecalciferol  1,000 Units Oral Daily  . gabapentin  100 mg Oral BID  . hydrocortisone  25 mg Rectal BID  . insulin aspart  0-15 Units Subcutaneous TID WC  . iron polysaccharides  150 mg Oral Daily  . magnesium oxide  400 mg Oral Daily  . multivitamin-lutein  1 capsule Oral Daily  . nicotine  21 mg Transdermal Daily  .  pantoprazole  40 mg Oral Daily  . Ensure Max Protein  11 oz Oral BID BM  . simvastatin  20 mg Oral q AM  . tiotropium  18 mcg Inhalation Daily  . traZODone  50 mg Oral QHS   acetaminophen, albuterol, ipratropium-albuterol, ondansetron **OR** ondansetron (ZOFRAN) IV, oxyCODONE  Assessment/ Plan:  Mr. LKraig Genisis a 75y.o.  male with past medical history of chronic Atrial Fibrillation, CHF, diabetes, CKD stage 3, GERD and hypertension. He presents to the ED with generalized weakness for 3 days.  1. Acute Kidney Injury on chronic kidney disease stage B with baseline creatinine 19 and GFR of 35.  Chronic kidney disease requires further work up Likely due to over diuresis Will monitor further diuresis  Urine output 1.35L Renal ultrasound negative for obstruction. Chronic renal disease evident SPEP/UPEP pending Improved edema Albumin 25g and Lasix 465mhr   Lab Results  Component Value Date   CREATININE 3.00 (H) 02/22/2021   CREATININE 3.33 (H) 02/21/2021   CREATININE 3.65 (H) 02/20/2021    Intake/Output Summary (Last 24 hours) at 02/22/2021 1353 Last data filed at 02/22/2021 1327 Gross per 24 hour  Intake 825 ml  Output 1800 ml  Net -975 ml   2. Hyponatremia secondary to acute kidney disease Current level 136 improved  3. Hypertension with congestive heart failure - acceptable for this patient - Albumin  - Home medications held  4. Anemia with kidney failure and iron deficiency Microcytic Hgb 7.0 Niferex 150 mg    LOS: 3   4/22/20221:53 PM

## 2021-02-22 NOTE — Progress Notes (Signed)
Pt alert and oriented x 4. Pt on CPAP today with the exception of meal times. Pt on nasal cannula for meals. Pt currently on Lasix drip. All pt needs met at this time. Report given to [facility]

## 2021-02-22 NOTE — Progress Notes (Signed)
[facility] Cardiology  Patient Description: [name] is a 75 year old male with PMH significant for CAD s/p CABG (2006), h/o HFrEF (most recent EF = 50-55%), paroxysmal atrial fibrillation (on Eliquis), h/o aortic valve stenosis, h/o CVA, CKD, DM type II, hyperlipidemia, hypertension, COPD and tobacco use who was admitted due to acute CHF exacerbation and AKI.  SUBJECTIVE: The patient reports to be doing " so, so" on today and states that he is having a hard time breathing with the CPAP machine.  He states that it "feels as if I am not getting enough air" on the CPAP machine. The patient's oxygen saturation is at 98%, respirations are 16 and he has a slightly increased effort of breathing at this time.  The patient denies having any chest pain, palpitations, peripheral edema or dizziness at this time. However, the patient does report tenderness to the BLE upon palpation.   OBJECTIVE: The patient appears chronically ill on today, he continues to be in fibrillation with slow ventricular response and a HR between 38-69 bpm as noted by the bedside telemetry monitor.  The patient continues to remain asymptomatic and all of his other vital signs are stable. The patient's hemoglobin on today is 7.0 and he will be undergoing a blood transfusion proceeded by IV lasix. The patient appears euvolemic on today and has minimal, nonpitting BLE edema.  Vitals:   02/22/21 0418 02/22/21 0757 02/22/21 0959 02/22/21 1027  BP: 122/62 111/69 (!) 109/56 (!) 127/114  Pulse: (!) 49 74 (!) 57 (!) 51  Resp: 18 16 20 20   Temp: 97.8 F (36.6 C) 98.4 F (36.9 C) 98.6 F (37 C) 98.4 F (36.9 C)  TempSrc:  Oral Axillary Axillary  SpO2: 98% 100% 97% 96%  Weight: 81.3 kg     Height:         Intake/Output Summary (Last 24 hours) at 02/22/2021 1100 Last data filed at 02/22/2021 1012 Gross per 24 hour  Intake 335 ml  Output 1100 ml  Net -765 ml      PHYSICAL EXAM  General: Well developed, well nourished, in no acute  distress HEENT:  Normocephalic and atraumatic. PERRL Neck:  No JVD.  Lungs: expiratory wheezes bilaterally to auscultation.  Chest expansion symmetrical, normal effort of breathing, negative for rhonchi, crackles or rales Heart: Irregular heart rate and rhythm. Normal S1 and S2 without gallops or murmurs.  Abdomen: Bowel sounds are positive, abdomen soft and non-tender  Msk:  Normal strength and tone for age. Extremities: Mild, nonpitting BLE edema. Tenderness upon palpitation of the BLE.  No clubbing or cyanosis Neuro: Alert and oriented X 3. Psych:  Good affect, responds appropriately   LABS: Basic Metabolic Panel: Recent Labs    02/21/21 0527 02/22/21 0447  NA 133* 136  K 5.8* 5.1  CL 102 104  CO2 22 21*  GLUCOSE 163* 105*  BUN 65* 59*  CREATININE 3.33* 3.00*  CALCIUM 8.0* 8.0*   Liver Function Tests: No results for input(s): AST, ALT, ALKPHOS, BILITOT, PROT, ALBUMIN in the last 72 hours. No results for input(s): LIPASE, AMYLASE in the last 72 hours. CBC: Recent Labs    02/21/21 0527 02/22/21 0447  WBC 8.7 6.7  NEUTROABS 7.0 5.2  HGB 7.6* 7.0*  HCT 25.7* 24.7*  MCV 78.6* 81.8  PLT 275 259   Cardiac Enzymes: No results for input(s): CKTOTAL, CKMB, CKMBINDEX, TROPONINI in the last 72 hours. BNP: Invalid input(s): POCBNP D-Dimer: No results for input(s): DDIMER in the last 72 hours. Hemoglobin A1C: No  results for input(s): HGBA1C in the last 72 hours. Fasting Lipid Panel: No results for input(s): CHOL, HDL, LDLCALC, TRIG, CHOLHDL, LDLDIRECT in the last 72 hours. Thyroid Function Tests: No results for input(s): TSH, T4TOTAL, T3FREE, THYROIDAB in the last 72 hours.  Invalid input(s): FREET3 Anemia Panel: No results for input(s): VITAMINB12, FOLATE, FERRITIN, TIBC, IRON, RETICCTPCT in the last 72 hours.  US RENAL  Result Date: 02/20/2021 CLINICAL DATA:  Acute renal failure.  Chronic renal disease. EXAM: RENAL / URINARY TRACT ULTRASOUND COMPLETE COMPARISON:   No recent prior. FINDINGS: Right Kidney: Renal measurements: 9.8 x 4.5 x 4.0 cm = volume: 91.2 mL. Mild increased echogenicity. No mass or hydronephrosis visualized. Left Kidney: Renal measurements: 9.4 x 5.3 x 5.1 cm = volume: 132.8 mL. Mild increased echogenicity. No mass or hydronephrosis visualized. Bladder: Mild bladder distention cannot be excluded. Other: Small right pleural effusion cannot be excluded. IMPRESSION: 1. Mild increased echogenicity both kidneys suggesting chronic medical renal disease. No acute renal abnormality identified. 2.  Bladder distention cannot be excluded. 3.  Small right pleural effusion cannot be excluded. Electronically Signed   By: Marcello Moores  Register   On: 02/20/2021 13:37   DG Chest Port 1 View  Result Date: 02/22/2021 CLINICAL DATA:  Pulmonary edema EXAM: PORTABLE CHEST 1 VIEW COMPARISON:  Yesterday FINDINGS: Generalized interstitial opacity with small pleural effusions. Stable lung volumes with diaphragm flattening. Cardiomegaly. Prior CABG. No pneumothorax IMPRESSION: 1. Unchanged CHF pattern. 2. COPD Electronically Signed   By: Monte Fantasia M.D.   On: 02/22/2021 04:46   DG Chest Port 1 View  Result Date: 02/21/2021 CLINICAL DATA:  Shortness of breath, weakness, history COPD, hypertension, diabetes mellitus, stroke, atrial fibrillation, smoker, CHF, stage III chronic kidney disease EXAM: PORTABLE CHEST 1 VIEW COMPARISON:  Portable exam 0821 hours compared to 02/19/2021 FINDINGS: Enlargement of cardiac silhouette with vascular congestion post CABG. Mediastinal contours normal. Atherosclerotic calcification aorta. Interstitial infiltrates in both lungs increased since previous exam favor pulmonary edema though multifocal infection not completely excluded. Tiny LEFT pleural effusion. No pneumothorax or acute osseous findings. IMPRESSION: Enlargement of cardiac silhouette with pulmonary vascular congestion post CABG. Diffuse infiltrates increased from prior exam favor  pulmonary edema/CHF. Tiny LEFT pleural effusion. Aortic Atherosclerosis (ICD10-I70.0). Electronically Signed   By: Lavonia Dana M.D.   On: 02/21/2021 10:17     Echo: (11/15/2020)  IMPRESSIONS  1. Left ventricular ejection fraction, by estimation, is 50 to 55%. The  left ventricle has low normal function. The left ventricle has no regional  wall motion abnormalities. Left ventricular diastolic parameters were  normal.  2. Right ventricular systolic function is normal. The right ventricular  size is mildly enlarged.  3. The mitral valve was not well visualized. Mild mitral valve  regurgitation.  4. The aortic valve is calcified. Aortic valve regurgitation is trivial.  5. Aortic dilatation noted.   TELEMETRY: Atrial fibrillation with slow ventricular response  ASSESSMENT AND PLAN:  Principal Problem:   AKI (acute kidney injury) ([place]) Active Problems:   Chronic kidney disease (CKD) stage G3b/A3, moderately decreased glomerular filtration rate (GFR) between 30-44 mL/min/1.73 square meter and albuminuria creatinine ratio greater than 300 mg/g (HCC)   History of CVA (cerebrovascular accident)   Chronic diastolic CHF (congestive heart failure) (HCC)   Atrial fibrillation, chronic (HCC)   Type 2 diabetes mellitus with hypoglycemia without coma (HCC)   Bradycardia   Weakness generalized   Dental infection   Pressure injury of skin    1. Acute on Chronic diastolic  HF, reasonably stable, patient appears euvolemic on today with very mild, nonpitting BLE edema  -Recommend continuing gentle hydration at this time due to AKI and initial sensation of volume depletion.  -Beta-blocker therapy contraindicated at this time due to slow ventricular response.  -Nephrology input appreciated.  -Recommend following a low-sodium diet, performing daily weights and monitoring strict I's and O's.  2. Atrial fibrillation with slow ventricular response, reasonably stable  -We will continue  anticoagulation with Eliquis.  -Recommend following bleeding precautions.  -Rate controlled medication not indicated at this time in the presence of slow ventricular response.   -Commend continuous telemetry monitoring until discharge.  3. CAD s/p CABG  -Recommend continuing statin therapy.  -Recommend following heart healthy diet.  4. AKI, fairly stable with improvement in creatinine  -Nephrology consult appreciated.  5. Severe anemia, patient's hemoglobin level today is 7.0, patient is scheduled to undergo blood transfusion per hospitalist orders  -Agree with IV Lasix prior to blood transfusion.  -Agree with current management.  6.  COPD with acute on chronic respiratory failure, fairly stable, patient is having dyspnea at this time  -Agree with current therapy.  -RT evaluation and treatment greatly appreciated.  7. HTN, recent control, patient normotensive on today  -Recommend continuing to follow a low-sodium diet.  8. HLD  -Recommend continuing statin therapy.     Damarkus Balis, ACNPC-AG  02/22/2021 11:00 AM

## 2021-02-23 ENCOUNTER — Other Ambulatory Visit: Payer: Self-pay

## 2021-02-23 DIAGNOSIS — N179 Acute kidney failure, unspecified: Secondary | ICD-10-CM | POA: Diagnosis not present

## 2021-02-23 LAB — PHOSPHORUS: Phosphorus: 4.2 mg/dL (ref 2.5–4.6)

## 2021-02-23 LAB — BASIC METABOLIC PANEL
Anion gap: 14 (ref 5–15)
BUN: 56 mg/dL — ABNORMAL HIGH (ref 8–23)
CO2: 23 mmol/L (ref 22–32)
Calcium: 8.7 mg/dL — ABNORMAL LOW (ref 8.9–10.3)
Chloride: 101 mmol/L (ref 98–111)
Creatinine, Ser: 2.95 mg/dL — ABNORMAL HIGH (ref 0.61–1.24)
GFR, Estimated: 21 mL/min — ABNORMAL LOW (ref >60)
Glucose, Bld: 133 mg/dL — ABNORMAL HIGH (ref 70–99)
Potassium: 4.7 mmol/L (ref 3.5–5.1)
Sodium: 138 mmol/L (ref 135–145)

## 2021-02-23 LAB — TYPE AND SCREEN
ABO/RH(D): O POS
Antibody Screen: NEGATIVE
Unit division: 0

## 2021-02-23 LAB — CBC WITH DIFFERENTIAL/PLATELET
Abs Immature Granulocytes: 0.02 10*3/uL (ref 0.00–0.07)
Basophils Absolute: 0 10*3/uL (ref 0.0–0.1)
Basophils Relative: 0 %
Eosinophils Absolute: 0.1 10*3/uL (ref 0.0–0.5)
Eosinophils Relative: 2 %
HCT: 29.3 % — ABNORMAL LOW (ref 39.0–52.0)
Hemoglobin: 9.1 g/dL — ABNORMAL LOW (ref 13.0–17.0)
Immature Granulocytes: 0 %
Lymphocytes Relative: 16 %
Lymphs Abs: 1.1 10*3/uL (ref 0.7–4.0)
MCH: 24.1 pg — ABNORMAL LOW (ref 26.0–34.0)
MCHC: 31.1 g/dL (ref 30.0–36.0)
MCV: 77.5 fL — ABNORMAL LOW (ref 80.0–100.0)
Monocytes Absolute: 0.6 10*3/uL (ref 0.1–1.0)
Monocytes Relative: 9 %
Neutro Abs: 5 10*3/uL (ref 1.7–7.7)
Neutrophils Relative %: 73 %
Platelets: 261 10*3/uL (ref 150–400)
RBC: 3.78 MIL/uL — ABNORMAL LOW (ref 4.22–5.81)
RDW: 20 % — ABNORMAL HIGH (ref 11.5–15.5)
WBC: 7 10*3/uL (ref 4.0–10.5)
nRBC: 0 % (ref 0.0–0.2)

## 2021-02-23 LAB — BPAM RBC
Blood Product Expiration Date: 202205232359
ISSUE DATE / TIME: 202204221007
Unit Type and Rh: 5100

## 2021-02-23 LAB — GLUCOSE, CAPILLARY
Glucose-Capillary: 127 mg/dL — ABNORMAL HIGH (ref 70–99)
Glucose-Capillary: 155 mg/dL — ABNORMAL HIGH (ref 70–99)
Glucose-Capillary: 205 mg/dL — ABNORMAL HIGH (ref 70–99)
Glucose-Capillary: 179 mg/dL — ABNORMAL HIGH (ref 70–99)

## 2021-02-23 MED ORDER — SODIUM CHLORIDE 0.9 % IV SOLN
INTRAVENOUS | Status: DC | PRN
  Administered 2021-02-23 – 2021-02-24 (×2): 250 mL via INTRAVENOUS

## 2021-02-23 NOTE — Progress Notes (Signed)
[facility] Cardiology    SUBJECTIVE: Currently receiving patient care by the nurses bath and changing in the bed no significant complaints   Vitals:   02/22/21 1943 02/23/21 0444 02/23/21 0800 02/23/21 1110  BP: (!) 120/58 (!) 169/58 (!) 140/48 (!) 163/50  Pulse: (!) 55 64 (!) 49 76  Resp: 20 20 19 19   Temp: 98 F (36.7 C) 97.8 F (36.6 C) 97.8 F (36.6 C) 97.7 F (36.5 C)  TempSrc:   Axillary Axillary  SpO2: 100% 100% 98% 94%  Weight:  85.5 kg    Height:         Intake/Output Summary (Last 24 hours) at 02/23/2021 1437 Last data filed at 02/23/2021 1330 Gross per 24 hour  Intake 480.57 ml  Output 2000 ml  Net -1519.43 ml      PHYSICAL EXAM  General: Well developed, well nourished, in no acute distress HEENT:  Normocephalic and atramatic Neck:  No JVD.  Lungs: Clear bilaterally to auscultation and percussion. Heart: HRRR . Normal S1 and S2 without gallops or murmurs.  Abdomen: Bradycardia sounds are positive, abdomen soft and non-tender  Msk:  Back normal, normal gait. Normal strength and tone for age. Extremities: No clubbing, cyanosis or edema.   Neuro: Alert and oriented X 3. Psych:  Good affect, responds appropriately   LABS: Basic Metabolic Panel: Recent Labs    02/22/21 0447 02/23/21 0531  NA 136 138  K 5.1 4.7  CL 104 101  CO2 21* 23  GLUCOSE 105* 133*  BUN 59* 56*  CREATININE 3.00* 2.95*  CALCIUM 8.0* 8.7*   Liver Function Tests: No results for input(s): AST, ALT, ALKPHOS, BILITOT, PROT, ALBUMIN in the last 72 hours. No results for input(s): LIPASE, AMYLASE in the last 72 hours. CBC: Recent Labs    02/22/21 0447 02/22/21 1620 02/23/21 0531  WBC 6.7  --  7.0  NEUTROABS 5.2  --  5.0  HGB 7.0* 8.3* 9.1*  HCT 24.7*  --  29.3*  MCV 81.8  --  77.5*  PLT 259  --  261   Cardiac Enzymes: No results for input(s): CKTOTAL, CKMB, CKMBINDEX, TROPONINI in the last 72 hours. BNP: Invalid input(s): POCBNP D-Dimer: No results for input(s): DDIMER in the  last 72 hours. Hemoglobin A1C: No results for input(s): HGBA1C in the last 72 hours. Fasting Lipid Panel: No results for input(s): CHOL, HDL, LDLCALC, TRIG, CHOLHDL, LDLDIRECT in the last 72 hours. Thyroid Function Tests: No results for input(s): TSH, T4TOTAL, T3FREE, THYROIDAB in the last 72 hours.  Invalid input(s): FREET3 Anemia Panel: No results for input(s): VITAMINB12, FOLATE, FERRITIN, TIBC, IRON, RETICCTPCT in the last 72 hours.  DG Chest Port 1 View  Result Date: 02/22/2021 CLINICAL DATA:  Pulmonary edema EXAM: PORTABLE CHEST 1 VIEW COMPARISON:  Yesterday FINDINGS: Generalized interstitial opacity with small pleural effusions. Stable lung volumes with diaphragm flattening. Cardiomegaly. Prior CABG. No pneumothorax IMPRESSION: 1. Unchanged CHF pattern. 2. COPD Electronically Signed   By: Monte Fantasia M.D.   On: 02/22/2021 04:46     Echo preserved left ventricular function EF of 55%  TELEMETRY: Fibrillation rate of about 55  ASSESSMENT AND PLAN:  Principal Problem:   AKI (acute kidney injury) ([place]) Active Problems:   Chronic kidney disease (CKD) stage G3b/A3, moderately decreased glomerular filtration rate (GFR) between 30-44 mL/min/1.73 square meter and albuminuria creatinine ratio greater than 300 mg/g (HCC)   History of CVA (cerebrovascular accident)   Chronic diastolic CHF (congestive heart failure) (HCC)   Atrial fibrillation,  chronic ([place])   Type 2 diabetes mellitus with hypoglycemia without coma (HCC)   Bradycardia   Weakness generalized   Dental infection   Pressure injury of skin    Plan Agree with nephrology input for acute on chronic renal insufficiency Continue heart failure therapy for diastolic dysfunction Continue submental oxygen and/or CPAP as necessary for lung disease History of coronary bypass surgery multivessel coronary disease continue medical therapy including statin Atrial fibrillation appears to be well controlled with rate and  anticoagulation Hyperlipidemia continue statin therapy Maintain conservative cardiology input at this point   Yolonda Kida, MD,  02/23/2021 2:37 PM

## 2021-02-23 NOTE — Progress Notes (Signed)
PROGRESS NOTE    [name]  BZJ:[id number] DOB: [birth year] DOA: 02/19/2021 PCP: [name], MD   Brief Narrative:  [name] is a 75 y.o. 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 Patient states that he has had weakness for the last 3 days which has made it difficult for him to ambulate.  This morning he states he was unable to get off the toilet and actually had a fall this morning. He denies feeling dizzy or lightheaded and complains of just generalized weakness. He denies having any chest pain, no shortness of breath, no fever, no chills, no headache, no abdominal pain, no nausea, no vomiting, no diarrhea, no urinary symptoms, no blurred vision, no difficulty swallowing, no headache.  Found to be bradycardic, hypoglycemic, borderline hypotensive.  Found to have significant AKI thought to be due to overdiuresis.  Case discussed with cardiology and nephrology.  Recommendations appreciated.  4/21: This morning patient was complaining of worsening shortness of breath.  Stat chest x-ray demonstrates fluid overload.  Administered 60 mg IV Lasix.  CPAP ordered.  4/22: Patient still endorsing shortness of breath although improved from prior.  Tolerated BiPAP okay.  Does better with it on. 4/23: On Lasix infusion.  Continues to require CPAP.    Assessment & Plan:   Principal Problem:   AKI (acute kidney injury) ([place]) Active Problems:   Chronic kidney disease (CKD) stage G3b/A3, moderately decreased glomerular filtration rate (GFR) between 30-44 mL/min/1.73 square meter and albuminuria creatinine ratio greater than 300 mg/g (HCC)   History of CVA (cerebrovascular accident)   Chronic diastolic CHF (congestive heart  failure) (HCC)   Atrial fibrillation, chronic (HCC)   Type 2 diabetes mellitus with hypoglycemia without coma (HCC)   Bradycardia   Weakness generalized   Dental infection   Pressure injury of skin  Acute kidney injury on chronic kidney disease stage IIIb Hyperkalemia Hyponatremia Thought to be secondary to overdiuresis Developed edema on chest x-ray At baseline patient has a serum creatinine of 1.95, on admission his creatinine is 3.95 [facility] consulted Plan: No further IV fluids at this time.  Patient highly sensitive to IVF and develops pulmonary edema very rapidly Lasix GTT with albumin per nephrology recommendations Careful monitoring of volume status Daily BMP  Acute on chronic anemia Hb drifting down to 7 on 4/22 Patient with cardiac history Ideal Hb goal 8 Plan: Continue Eliquis.  No further transfusions at this time.  Daily CBC  Atrial fibrillation with slow ventricular response Chronic issue per Dr. Ubaldo Glassing who the patient is well-known to Plan: Avoid AV nodal blocking agents Continue apixaban for anticoagulation, may need dose reduction if kidney function does not recover  Diabetes mellitus with complications of stage III chronic kidney disease and hypoglycemia Hold insulin Blood sugar checks before meals and at bedtime Encourage oral intake   COPD with acute on chronic respiratory failure Not acutely exacerbated Continue Spiriva, as needed bronchodilator therapy and inhaled steroids Wean oxygen as tolerated   Acute on chronic diastolic congestive heart failure Diuresis Lasix GTT with supplemental albumin Holding home diuretics   Coronary artery disease Stable and not acutely exacerbated Continue statins and aspirin Hold nitrates    Generalized weakness/fall  Multifactorial and related to bradycardia as well as hypotension  Place patient on fall precautions Therapy evals when able    Dental infection Patient noted  to have purulence around his gum line Cervical spine CT shows an expansile lucent lesion with pathologic fracture  identified involving the midline the maxilla. Recommend further evaluation with dedicated maxillofacial CT with contrast. ENT was consulted in the ER and he recommends to obtain maxillofacial CT with contrast once renal function improves Continue Unasyn for now   DVT prophylaxis: Eliquis Code Status: Full Family Communication: Wife [name] [phone] on 4/22 Disposition Plan: Status is: Inpatient  Remains inpatient appropriate because:Inpatient level of care appropriate due to severity of illness   Dispo: The patient is from: Home              Anticipated d/c is to: Home              Patient currently is not medically stable to d/c.   Difficult to place patient No  AKI on CKD.  On Lasix gtt.     Level of care: Progressive Cardiac  Consultants:  Nephrology Cardiology   Procedures:None  Antimicrobials:   Unasyn   Subjective: Seen examined.  Respiratory status started to improve.  Objective: Vitals:   02/22/21 1943 02/23/21 0444 02/23/21 0800 02/23/21 1110  BP: (!) 120/58 (!) 169/58 (!) 140/48 (!) 163/50  Pulse: (!) 55 64 (!) 49 76  Resp: 20 20 19 19   Temp: 98 F (36.7 C) 97.8 F (36.6 C) 97.8 F (36.6 C) 97.7 F (36.5 C)  TempSrc:   Axillary Axillary  SpO2: 100% 100% 98% 94%  Weight:  85.5 kg    Height:        Intake/Output Summary (Last 24 hours) at 02/23/2021 1407 Last data filed at 02/23/2021 1330 Gross per 24 hour  Intake 480.57 ml  Output 2000 ml  Net -1519.43 ml   Filed Weights   02/19/21 0836 02/22/21 0418 02/23/21 0444  Weight: 74 kg 81.3 kg 85.5 kg    Examination:  General exam: No acute distress.  Fatigued Respiratory system: Normal work of breathing.  Bibasilar crackles.  On CPAP cardiovascular system: S1-S2, bradycardic, no appreciable murmurs, trace np edema BL Gastrointestinal system: Abdomen is nondistended, soft  and nontender. No organomegaly or masses felt. Normal bowel sounds heard. Central nervous system: Alert, oriented x2, no focal deficits, lethargic extremities: Symmetric 5 x 5 power. Skin: No rashes, lesions or ulcers Psychiatry: Judgement and insight appear impaired. Mood & affect flattened.     Data Reviewed: I have personally reviewed following labs and imaging studies  CBC: Recent Labs  Lab 02/19/21 0849 02/20/21 0755 02/21/21 0527 02/22/21 0447 02/22/21 1620 02/23/21 0531  WBC 10.0 8.1 8.7 6.7  --  7.0  NEUTROABS 8.6* 6.3 7.0 5.2  --  5.0  HGB 8.1* 7.7* 7.6* 7.0* 8.3* 9.1*  HCT 26.2* 24.7* 25.7* 24.7*  --  29.3*  MCV 78.0* 76.5* 78.6* 81.8  --  77.5*  PLT 276 266 275 259  --  119   Basic Metabolic Panel: Recent Labs  Lab 02/19/21 0849 02/20/21 0454 02/20/21 0755 02/21/21 0527 02/22/21 0447 02/23/21 0531  NA 133*  --  133* 133* 136 138  K 4.6  --  5.8* 5.8* 5.1 4.7  CL 103  --  100 102 104 101  CO2 20*  --  23 22 21* 23  GLUCOSE 48*  --  74 163* 105* 133*  BUN 70*  --  68* 65* 59* 56*  CREATININE 3.95* 3.63* 3.65* 3.33*  3.00* 2.95*  CALCIUM 7.5*  --  7.9* 8.0* 8.0* 8.7*   GFR: Estimated Creatinine Clearance: 22.2 mL/min (A) (by C-G formula based on SCr of 2.95 mg/dL (H)). Liver Function Tests: Recent Labs  Lab 02/19/21 0849  AST 15  ALT 12  ALKPHOS 102  BILITOT 0.7  PROT 5.6*  ALBUMIN 2.8*   No results for input(s): LIPASE, AMYLASE in the last 168 hours. No results for input(s): AMMONIA in the last 168 hours. Coagulation Profile: No results for input(s): INR, PROTIME in the last 168 hours. Cardiac Enzymes: Recent Labs  Lab 02/19/21 1056  CKTOTAL 34*   BNP (last 3 results) No results for input(s): PROBNP in the last 8760 hours. HbA1C: No results for input(s): HGBA1C in the last 72 hours. CBG: Recent Labs  Lab 02/22/21 1217 02/22/21 1608 02/22/21 1958 02/23/21 0816 02/23/21 1134  GLUCAP 154* 178* 148* 127* 155*   Lipid Profile: No  results for input(s): CHOL, HDL, LDLCALC, TRIG, CHOLHDL, LDLDIRECT in the last 72 hours. Thyroid Function Tests: No results for input(s): TSH, T4TOTAL, FREET4, T3FREE, THYROIDAB in the last 72 hours. Anemia Panel: No results for input(s): VITAMINB12, FOLATE, FERRITIN, TIBC, IRON, RETICCTPCT in the last 72 hours. Sepsis Labs: No results for input(s): PROCALCITON, LATICACIDVEN in the last 168 hours.  Recent Results (from the past 240 hour(s))  Resp Panel by RT-PCR (Flu A&B, Covid) Nasopharyngeal Swab     Status: None   Collection Time: 02/19/21  9:56 AM   Specimen: Nasopharyngeal Swab; Nasopharyngeal(NP) swabs in vial transport medium  Result Value Ref Range Status   SARS Coronavirus 2 by RT PCR NEGATIVE NEGATIVE Final    Comment: (NOTE) SARS-CoV-2 target nucleic acids are NOT DETECTED.  The SARS-CoV-2 RNA is generally detectable in upper respiratory specimens during the acute phase of infection. The lowest concentration of SARS-CoV-2 viral copies this assay can detect is 138 copies/mL. A negative result does not preclude SARS-Cov-2 infection and should not be used as the sole basis for treatment or other patient management decisions. A negative result may occur with  improper specimen collection/handling, submission of specimen other than nasopharyngeal swab, presence of viral mutation(s) within the areas targeted by this assay, and inadequate number of viral copies(<138 copies/mL). A negative result must be combined with clinical observations, patient history, and epidemiological information. The expected result is Negative.  Fact Sheet for Patients:  EntrepreneurPulse.com.au  Fact Sheet for Healthcare Providers:  IncredibleEmployment.be  This test is no t yet approved or cleared by the [place] FDA and  has been authorized for detection and/or diagnosis of SARS-CoV-2 by FDA under an Emergency Use Authorization (EUA). This EUA will remain   in effect (meaning this test can be used) for the duration of the COVID-19 declaration under Section 564(b)(1) of the Act, 21 U.S.C.section 360bbb-3(b)(1), unless the authorization is terminated  or revoked sooner.       Influenza A by PCR NEGATIVE NEGATIVE Final   Influenza B by PCR NEGATIVE NEGATIVE Final    Comment: (NOTE) The Xpert Xpress SARS-CoV-2/FLU/RSV plus assay is intended as an aid in the diagnosis of influenza from Nasopharyngeal swab specimens and should not be used as a sole basis for treatment. Nasal washings and aspirates are unacceptable for Xpert Xpress SARS-CoV-2/FLU/RSV testing.  Fact Sheet for Patients: EntrepreneurPulse.com.au  Fact Sheet for Healthcare Providers: IncredibleEmployment.be  This test is not yet approved or cleared by the [place] FDA and has been authorized for detection and/or diagnosis of SARS-CoV-2 by FDA under an  Emergency Use Authorization (EUA). This EUA will remain in effect (meaning this test can be used) for the duration of the COVID-19 declaration under Section 564(b)(1) of the Act, 21 U.S.C. section 360bbb-3(b)(1), unless the authorization is terminated or revoked.  Performed at [facility], [address] [place], [place] [address]          Radiology Studies: DG Chest [place] 1 View  Result Date: 02/22/2021 CLINICAL DATA:  Pulmonary edema EXAM: PORTABLE CHEST 1 VIEW COMPARISON:  Yesterday FINDINGS: Generalized interstitial opacity with small pleural effusions. Stable lung volumes with diaphragm flattening. Cardiomegaly. Prior CABG. No pneumothorax IMPRESSION: 1. Unchanged CHF pattern. 2. COPD Electronically Signed   By: Monte Fantasia M.D.   On: 02/22/2021 04:46        Scheduled Meds: . sodium chloride   Intravenous Once  . acidophilus  1 capsule Oral Daily  . apixaban  5 mg Oral BID  . cholecalciferol  1,000 Units Oral Daily  . gabapentin  100 mg Oral BID  .  hydrocortisone  25 mg Rectal BID  . insulin aspart  0-15 Units Subcutaneous TID WC  . iron polysaccharides  150 mg Oral Daily  . magnesium oxide  400 mg Oral Daily  . multivitamin-lutein  1 capsule Oral Daily  . nicotine  21 mg Transdermal Daily  . pantoprazole  40 mg Oral Daily  . Ensure Max Protein  11 oz Oral BID BM  . simvastatin  20 mg Oral q AM  . tiotropium  18 mcg Inhalation Daily  . traZODone  50 mg Oral QHS   Continuous Infusions: . sodium chloride 250 mL (02/23/21 1258)  . ampicillin-sulbactam (UNASYN) IV 3 g (02/23/21 1302)  . furosemide (LASIX) 200 mg in dextrose 5% 100 mL (2mg /mL) infusion 4 mg/hr (02/22/21 1609)     LOS: 4 days    Time spent: 25 minutes    Sidney Ace, MD [facility] Pager [phone]  If 7PM-7AM, please contact night-coverage 02/23/2021, 2:07 PM

## 2021-02-23 NOTE — Plan of Care (Signed)
Patient currently on cpap alternating with oxygen 4 L [place] when eating, lasix drip infusing 2 ml/hr as per order,   VSS no respiratory distress noted .  Problem: Education: Goal: Knowledge of General Education information will improve Description: Including pain rating scale, medication(s)/side effects and non-pharmacologic comfort measures Outcome: Progressing   Problem: Activity: Goal: Risk for activity intolerance will decrease Outcome: Progressing   Problem: Safety: Goal: Ability to remain free from injury will improve Outcome: Progressing

## 2021-02-23 NOTE — Evaluation (Signed)
Occupational Therapy Evaluation Patient Details Name: [name] MRN: [id number] DOB: 02-22-1946 Today's Date: 02/23/2021    History of Present Illness [name] is a 75 y.o. 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  Patient states that he has had weakness for the last 3 days which has made it difficult for him to ambulate.  This morning he states he was unable to get off the toilet and actually had a fall this morning.   Clinical Impression   [name] presents with decreased endurance and generalized weakness that impacts his ability to safely and independently complete functional tasks.  Prior to admission, pt was able to complete functional mobility and toileting with supervision assist from his wife.  He received assistance for sponge bathing, lower body dressing, transportation, medication management, and household maintenance tasks.  Currently, pt requires higher level of physical assist due to generalized weakness and shortness of breath.  Pt declined any OOB mobility or ADL assessment due to complaints of shortness of breath (HR and SpO2 in 90s on 4L O2).  Per PT report, pt currently requires mod assist for bed mobility.  Anticipate pt requires min-mod assist for seated UB ADLs and mod-max assist for lower body ADLs and those requiring functional mobility.  OTR provided education re: OT role and plan of care and importance of OOB mobility to support functional strengthening and endurance.  [name] will continue to benefit from skilled OT services in acute setting to address endurance, functional strengthening, and safety and independence in ADLs.  SNF is most appropriate discharge recommendation at this time.    Follow  Up Recommendations  SNF    Equipment Recommendations  3 in 1 bedside commode    Recommendations for Other Services       Precautions / Restrictions Precautions Precautions: Fall Restrictions Weight Bearing Restrictions: No      Mobility Bed Mobility Overal bed mobility: Needs Assistance             General bed mobility comments: pt deferred this session 2/2 SOB, required mod assist for supine <> sit and rolling per PT report Patient Response: Cooperative  Transfers Overall transfer level: Needs assistance               General transfer comment: unable to attempt due to pt decline and SOB    Balance Overall balance assessment: Needs assistance                                         ADL either performed or assessed with clinical judgement   ADL Overall ADL's : Needs assistance/impaired                                       General ADL Comments: pt denied OOB mobility 2/2 shortness of breath, anticipate mid-mod physical assist for seated upper body ADLs and mod-max assist for lower body ADLs given pt report, PT report, and clinical reasoning     Vision Baseline Vision/History: Wears glasses Wears Glasses: Reading only Patient Visual Report: No change from baseline Vision  Assessment?: No apparent visual deficits     Perception     Praxis      Pertinent Vitals/Pain Pain Assessment: Faces Faces Pain Scale: Hurts even more Pain Location: soreness on R side of trunk 2/2 fall Pain Descriptors / Indicators: Guarding;Grimacing;Discomfort;Sore Pain Intervention(s): Limited activity within patient's tolerance;Monitored during session     Hand Dominance Right   Extremity/Trunk Assessment Upper Extremity Assessment Upper Extremity Assessment: Generalized weakness   Lower Extremity Assessment Lower Extremity Assessment: Generalized weakness       Communication Communication Communication: HOH   Cognition  Arousal/Alertness: Awake/alert Behavior During Therapy: WFL for tasks assessed/performed Overall Cognitive Status: Within Functional Limits for tasks assessed                                 General Comments: cognition and overall session limited 2/2 pt's SOB and use of CPAP machine   General Comments  HR and O2 in 90s, but pt reports significant shortness of breath while using CPAP    Exercises Other Exercises Other Exercises: provided education re: OT role and plan of care, fall and safety precautions, importance of OOB mobility to optimize independence and functional strengthening   Shoulder Instructions      Home Living Family/patient expects to be discharged to:: Private residence Living Arrangements: Spouse/significant other Available Help at Discharge: Family;Available PRN/intermittently Type of Home: House Home Access: Stairs to enter [employer] of Steps: 3-4 Entrance Stairs-Rails: Right;Left Home Layout: One level     Bathroom Shower/Tub: Teacher, early years/pre: Standard     Home Equipment: Shower seat;Cane - single point;Wheelchair - Press photographer;Bedside commode;Walker - 4 wheels;Walker - 2 wheels          Prior Functioning/Environment Level of Independence: Needs assistance  Gait / Transfers Assistance Needed: MOD I with [place] household distances, electric scooter community distances. Endorsed that his wife will help him with ambulation as needed ADL's / Homemaking Assistance Needed: wife assists with meals, med management, lower body dressing, sponge bathing, and transportation   Comments: Home O2 since last admission.  Endorses "a few" falls in the past year, including the one that brought him to hospital.        OT Problem List: Decreased strength;Decreased range of motion;Decreased activity tolerance;Impaired balance (sitting and/or standing);Cardiopulmonary status limiting activity;Pain;Increased edema       OT Treatment/Interventions: Self-care/ADL training;Therapeutic exercise;Energy conservation;DME and/or AE instruction;Therapeutic activities;Patient/family education;Balance training    OT Goals(Current goals can be found in the care plan section) Acute Rehab OT Goals Patient Stated Goal: none stated this session OT Goal Formulation: With patient Time For Goal Achievement: 03/09/21 Potential to Achieve Goals: Good  OT Frequency: Min 2X/week   Barriers to D/C: Decreased caregiver support  pt's wife at risk for injury if providing significant level of physical assist       Co-evaluation              AM-PAC OT "6 Clicks" Daily Activity     Outcome Measure Help from another person eating meals?: A Little Help from another person taking care of personal grooming?: A Little Help from another person toileting, which includes using toliet, bedpan, or urinal?: A Lot Help from another person bathing (including washing, rinsing, drying)?: A Lot Help from another person to put on and taking off regular upper body clothing?: A Lot Help from another person to put on and taking off regular lower body clothing?:  A Lot 6 Click Score: 14   End of Session    Activity Tolerance: Patient limited by fatigue Patient left: in bed;with call bell/phone within reach;with bed alarm set  OT Visit Diagnosis: Other abnormalities of gait and mobility (R26.89);Repeated falls (R29.6);Muscle weakness (generalized) (M62.81)                Time: 5009-3818 OT Time Calculation (min): 13 min Charges:  OT General Charges $OT Visit: 1 Visit OT Evaluation $OT Eval Moderate Complexity: 1 Mod  Virgle Arth Wells Faron Whitelock, OTR/L 02/23/21, 10:00 AM

## 2021-02-23 NOTE — Progress Notes (Signed)
[facility]  ROUNDING NOTE   Subjective:   [name] is a 75 y.o.male with past medical history of chronic Atrial Fibrillation, CHF, diabetes, CKD stage 3, GERD and hypertension. He presents to the ED with generalized weakness for 3 days.   We were consulted for evaluate of acute kidney injury. Baseline Creatinine of 1.95, BUN 70, and EGFR 15 on admission. Patient resting comfortably in the bed Patient offers no new specific physical complaints    Objective:  Vital signs in last 24 hours:  Temp:  [97.8 F (36.6 C)-99.1 F (37.3 C)] 97.8 F (36.6 C) (04/23 0800) Pulse Rate:  [49-64] 49 (04/23 0800) Resp:  [16-20] 19 (04/23 0800) BP: (120-169)/(44-59) 140/48 (04/23 0800) SpO2:  [94 %-100 %] 98 % (04/23 0800) Weight:  [85.5 kg] 85.5 kg (04/23 0444)  Weight change: 4.264 kg Filed Weights   02/19/21 0836 02/22/21 0418 02/23/21 0444  Weight: 74 kg 81.3 kg 85.5 kg    Intake/Output: I/O last 3 completed shifts: In: 825 [I.V.:100; Blood:725] Out: 3600 [Urine:3600]   Intake/Output this shift:  Total I/O In: 240 [P.O.:240] Out: -   Physical Exam: General: NAD, anxious  Head: Normocephalic, atraumatic. Moist oral mucosal membranes  Eyes: Anicteric  Neck: Supple, trachea midline  Lungs:  Diminished in bases, tachypnea, CPAP  Heart: Regular rate and rhythm  Abdomen:  Soft, nontender,   Extremities:  1+ peripheral edema.  Neurologic: Nonfocal, moving all four extremities  Skin: No lesions, red flaky BLE       Basic Metabolic Panel: Recent Labs  Lab 02/19/21 0849 02/20/21 0454 02/20/21 0755 02/21/21 0527 02/22/21 0447 02/23/21 0531  NA 133*  --  133* 133* 136 138  K 4.6  --  5.8* 5.8* 5.1 4.7  CL 103  --  100 102 104 101  CO2 20*  --  23 22 21* 23  GLUCOSE 48*  --  74 163* 105* 133*  BUN 70*  --  68* 65* 59* 56*  CREATININE 3.95* 3.63* 3.65* 3.33* 3.00* 2.95*  CALCIUM 7.5*  --  7.9* 8.0* 8.0* 8.7*    Liver Function Tests: Recent Labs  Lab  02/19/21 0849  AST 15  ALT 12  ALKPHOS 102  BILITOT 0.7  PROT 5.6*  ALBUMIN 2.8*   No results for input(s): LIPASE, AMYLASE in the last 168 hours. No results for input(s): AMMONIA in the last 168 hours.  CBC: Recent Labs  Lab 02/19/21 0849 02/20/21 0755 02/21/21 0527 02/22/21 0447 02/22/21 1620 02/23/21 0531  WBC 10.0 8.1 8.7 6.7  --  7.0  NEUTROABS 8.6* 6.3 7.0 5.2  --  5.0  HGB 8.1* 7.7* 7.6* 7.0* 8.3* 9.1*  HCT 26.2* 24.7* 25.7* 24.7*  --  29.3*  MCV 78.0* 76.5* 78.6* 81.8  --  77.5*  PLT 276 266 275 259  --  261    Cardiac Enzymes: Recent Labs  Lab 02/19/21 1056  CKTOTAL 34*    BNP: Invalid input(s): POCBNP  CBG: Recent Labs  Lab 02/22/21 0801 02/22/21 1217 02/22/21 1608 02/22/21 1958 02/23/21 0816  GLUCAP 119* 154* 178* 148* 127*    Microbiology: Results for orders placed or performed during the hospital encounter of 02/19/21  Resp Panel by RT-PCR (Flu A&B, Covid) Nasopharyngeal Swab     Status: None   Collection Time: 02/19/21  9:56 AM   Specimen: Nasopharyngeal Swab; Nasopharyngeal(NP) swabs in vial transport medium  Result Value Ref Range Status   SARS Coronavirus 2 by RT PCR NEGATIVE NEGATIVE  Final    Comment: (NOTE) SARS-CoV-2 target nucleic acids are NOT DETECTED.  The SARS-CoV-2 RNA is generally detectable in upper respiratory specimens during the acute phase of infection. The lowest concentration of SARS-CoV-2 viral copies this assay can detect is 138 copies/mL. A negative result does not preclude SARS-Cov-2 infection and should not be used as the sole basis for treatment or other patient management decisions. A negative result may occur with  improper specimen collection/handling, submission of specimen other than nasopharyngeal swab, presence of viral mutation(s) within the areas targeted by this assay, and inadequate number of viral copies(<138 copies/mL). A negative result must be combined with clinical observations, patient  history, and epidemiological information. The expected result is Negative.  Fact Sheet for Patients:  EntrepreneurPulse.com.au  Fact Sheet for Healthcare Providers:  IncredibleEmployment.be  This test is no t yet approved or cleared by the [place] FDA and  has been authorized for detection and/or diagnosis of SARS-CoV-2 by FDA under an Emergency Use Authorization (EUA). This EUA will remain  in effect (meaning this test can be used) for the duration of the COVID-19 declaration under Section 564(b)(1) of the Act, 21 U.S.C.section 360bbb-3(b)(1), unless the authorization is terminated  or revoked sooner.       Influenza A by PCR NEGATIVE NEGATIVE Final   Influenza B by PCR NEGATIVE NEGATIVE Final    Comment: (NOTE) The Xpert Xpress SARS-CoV-2/FLU/RSV plus assay is intended as an aid in the diagnosis of influenza from Nasopharyngeal swab specimens and should not be used as a sole basis for treatment. Nasal washings and aspirates are unacceptable for Xpert Xpress SARS-CoV-2/FLU/RSV testing.  Fact Sheet for Patients: EntrepreneurPulse.com.au  Fact Sheet for Healthcare Providers: IncredibleEmployment.be  This test is not yet approved or cleared by the [place] FDA and has been authorized for detection and/or diagnosis of SARS-CoV-2 by FDA under an Emergency Use Authorization (EUA). This EUA will remain in effect (meaning this test can be used) for the duration of the COVID-19 declaration under Section 564(b)(1) of the Act, 21 U.S.C. section 360bbb-3(b)(1), unless the authorization is terminated or revoked.  Performed at [facility], [address] [place], [place] [address]     Coagulation Studies: No results for input(s): LABPROT, INR in the last 72 hours.  Urinalysis: Recent Labs    02/20/21 1840  COLORURINE YELLOW*  LABSPEC 1.011  PHURINE 5.0  GLUCOSEU NEGATIVE  HGBUR  NEGATIVE  BILIRUBINUR NEGATIVE  KETONESUR NEGATIVE  PROTEINUR NEGATIVE  NITRITE NEGATIVE  LEUKOCYTESUR NEGATIVE      Imaging: DG Chest Port 1 View  Result Date: 02/22/2021 CLINICAL DATA:  Pulmonary edema EXAM: PORTABLE CHEST 1 VIEW COMPARISON:  Yesterday FINDINGS: Generalized interstitial opacity with small pleural effusions. Stable lung volumes with diaphragm flattening. Cardiomegaly. Prior CABG. No pneumothorax IMPRESSION: 1. Unchanged CHF pattern. 2. COPD Electronically Signed   By: Monte Fantasia M.D.   On: 02/22/2021 04:46     Medications:   . ampicillin-sulbactam (UNASYN) IV 3 g (02/22/21 2349)  . furosemide (LASIX) 200 mg in dextrose 5% 100 mL (58m/mL) infusion 4 mg/hr (02/22/21 1609)   . sodium chloride   Intravenous Once  . acidophilus  1 capsule Oral Daily  . apixaban  5 mg Oral BID  . cholecalciferol  1,000 Units Oral Daily  . gabapentin  100 mg Oral BID  . hydrocortisone  25 mg Rectal BID  . insulin aspart  0-15 Units Subcutaneous TID WC  . iron polysaccharides  150 mg Oral Daily  .  magnesium oxide  400 mg Oral Daily  . multivitamin-lutein  1 capsule Oral Daily  . nicotine  21 mg Transdermal Daily  . pantoprazole  40 mg Oral Daily  . Ensure Max Protein  11 oz Oral BID BM  . simvastatin  20 mg Oral q AM  . tiotropium  18 mcg Inhalation Daily  . traZODone  50 mg Oral QHS   acetaminophen, albuterol, ipratropium-albuterol, ondansetron **OR** ondansetron (ZOFRAN) IV, oxyCODONE    Renal U/s  FINDINGS: Right Kidney:   Renal measurements: 9.8 x 4.5 x 4.0 cm = volume: 91.2 mL. Mild increased echogenicity. No mass or hydronephrosis visualized.   Left Kidney:   Renal measurements: 9.4 x 5.3 x 5.1 cm = volume: 132.8 mL. Mild increased echogenicity. No mass or hydronephrosis visualized.   Bladder:   Mild bladder distention cannot be excluded.   Other:   Small right pleural effusion cannot be excluded.   IMPRESSION: 1. Mild increased echogenicity both  kidneys suggesting chronic medical renal disease. No acute renal abnormality identified.   2.  Bladder distention cannot be excluded.   3.  Small right pleural effusion cannot be excluded.     Assessment/ Plan:  [name] is a 75 y.o.  male with past medical history of chronic Atrial Fibrillation, CHF, diabetes, CKD stage 3, GERD and hypertension. He presents to the ED with generalized weakness for 3 days.   1)Renal    AKI secondary to  ATN AKI on CKD CKD stage 3b. CKD since 2015 CKD secondary to DM Progression of CKD marked with multiple AKI  Lab Results  Component Value Date   CREATININE 2.95 (H) 02/23/2021   CREATININE 3.00 (H) 02/22/2021   CREATININE 3.33 (H) 02/21/2021   Creat 3.9==>2.9  AKI better   CKD work up Results for Barry, Howard (MRN [id number]) as of 02/23/2021 10:52  Ref. Range 02/20/2021 18:40  M Component, Ur Latest Ref Range: Not Observed % Not Observed   Results for Barry, Howard (MRN [id number]) as of 02/23/2021 10:52  Ref. Range 02/20/2021 18:40  Total Protein, Urine-UPE24 Latest Ref Range: Not Estab. mg/dL 10.5    2)HTN    Blood pressure is stable    3)Anemia of chronic disease and iron deficiency anemia  CBC Latest Ref Rng & Units 02/23/2021 02/22/2021 02/22/2021  WBC 4.0 - 10.5 K/uL 7.0 - 6.7  Hemoglobin 13.0 - 17.0 g/dL 9.1(L) 8.3(L) 7.0(L)  Hematocrit 39.0 - 52.0 % 29.3(L) - 24.7(L)  Platelets 150 - 400 K/uL 261 - 259    Results for Barry, Howard (MRN [id number]) as of 02/23/2021 15:43  Ref. Range 01/28/2021 05:43  Iron Latest Ref Range: 45 - 182 ug/dL 25 (L)  UIBC Latest Units: ug/dL 431  TIBC Latest Ref Range: 250 - 450 ug/dL 456 (H)  Saturation Ratios Latest Ref Range: 17.9 - 39.5 % 6 (L)   Patient did receive PRBC during this admission  HGb at goal (9--11)   4) Secondary hyperparathyroidism -CKD Mineral-Bone Disorder    Lab Results  Component Value Date   CALCIUM 8.7 (L) 02/23/2021   CAION 1.17 10/02/2016    Secondary  Hyperparathyroidism w/u pending. Phosphorus will check   5)Acute on chronic diastolic congestive heart failure Improving  Patient is currently on Lasix drip Patient is negative by 3.5 L  6) Electrolytes   BMP Latest Ref Rng & Units 02/23/2021 02/22/2021 02/21/2021  Glucose 70 - 99 mg/dL 133(H) 105(H) 163(H)  BUN 8 - 23 mg/dL 56(H) 59(H) 65(H)  Creatinine  0.61 - 1.24 mg/dL 2.95(H) 3.00(H) 3.33(H)  Sodium 135 - 145 mmol/L 138 136 133(L)  Potassium 3.5 - 5.1 mmol/L 4.7 5.1 5.8(H)  Chloride 98 - 111 mmol/L 101 104 102  CO2 22 - 32 mmol/L 23 21(L) 22  Calcium 8.9 - 10.3 mg/dL 8.7(L) 8.0(L) 8.0(L)     Sodium Patient was hyponatremic earlier Now better   Potassium Hyperkalemic Now better    7)Acid base   Co2 at goal    Plan  We will continue the current IV Lasix regimen Will most likely changed from drip in the morning. We will check phosphorus   LOS: 4 Tymara Saur s [facility] 4/23/202210:52 AM

## 2021-02-24 DIAGNOSIS — N179 Acute kidney failure, unspecified: Secondary | ICD-10-CM | POA: Diagnosis not present

## 2021-02-24 LAB — BASIC METABOLIC PANEL
Anion gap: 11 (ref 5–15)
BUN: 53 mg/dL — ABNORMAL HIGH (ref 8–23)
CO2: 25 mmol/L (ref 22–32)
Calcium: 8.4 mg/dL — ABNORMAL LOW (ref 8.9–10.3)
Chloride: 102 mmol/L (ref 98–111)
Creatinine, Ser: 2.53 mg/dL — ABNORMAL HIGH (ref 0.61–1.24)
GFR, Estimated: 26 mL/min — ABNORMAL LOW (ref >60)
Glucose, Bld: 203 mg/dL — ABNORMAL HIGH (ref 70–99)
Potassium: 4.3 mmol/L (ref 3.5–5.1)
Sodium: 138 mmol/L (ref 135–145)

## 2021-02-24 LAB — CBC WITH DIFFERENTIAL/PLATELET
Abs Immature Granulocytes: 0.03 10*3/uL (ref 0.00–0.07)
Basophils Absolute: 0 10*3/uL (ref 0.0–0.1)
Basophils Relative: 0 %
Eosinophils Absolute: 0.1 10*3/uL (ref 0.0–0.5)
Eosinophils Relative: 2 %
HCT: 29.7 % — ABNORMAL LOW (ref 39.0–52.0)
Hemoglobin: 9 g/dL — ABNORMAL LOW (ref 13.0–17.0)
Immature Granulocytes: 0 %
Lymphocytes Relative: 16 %
Lymphs Abs: 1.2 10*3/uL (ref 0.7–4.0)
MCH: 24 pg — ABNORMAL LOW (ref 26.0–34.0)
MCHC: 30.3 g/dL (ref 30.0–36.0)
MCV: 79.2 fL — ABNORMAL LOW (ref 80.0–100.0)
Monocytes Absolute: 0.6 10*3/uL (ref 0.1–1.0)
Monocytes Relative: 8 %
Neutro Abs: 5.4 10*3/uL (ref 1.7–7.7)
Neutrophils Relative %: 74 %
Platelets: 264 10*3/uL (ref 150–400)
RBC: 3.75 MIL/uL — ABNORMAL LOW (ref 4.22–5.81)
RDW: 20.2 % — ABNORMAL HIGH (ref 11.5–15.5)
WBC: 7.4 10*3/uL (ref 4.0–10.5)
nRBC: 0 % (ref 0.0–0.2)

## 2021-02-24 LAB — GLUCOSE, CAPILLARY
Glucose-Capillary: 150 mg/dL — ABNORMAL HIGH (ref 70–99)
Glucose-Capillary: 142 mg/dL — ABNORMAL HIGH (ref 70–99)
Glucose-Capillary: 181 mg/dL — ABNORMAL HIGH (ref 70–99)
Glucose-Capillary: 205 mg/dL — ABNORMAL HIGH (ref 70–99)

## 2021-02-24 MED ORDER — MELATONIN 5 MG PO TABS
5.0000 mg | ORAL_TABLET | Freq: Every evening | ORAL | Status: DC | PRN
  Administered 2021-02-24 – 2021-03-01 (×4): 5 mg via ORAL
  Filled 2021-02-24 (×4): qty 1

## 2021-02-24 NOTE — TOC Progression Note (Signed)
Transition of Care [facility]) - Progression Note    Patient Details  Name: Erving Sassano MRN: [id number] Date of Birth: Dec 15, 1945  Transition of Care [facility]) CM/SW Contact  [name], RN Phone Number: 02/24/2021, 5:22 PM  Clinical Narrative:  OT recommends SNF. PT has been unable to complete evaluation due to SOB. [name] RN CM      Expected Discharge Plan: [place] Barriers to Discharge: Continued Medical Work up  Expected Discharge Plan and Services Expected Discharge Plan: [place] In-house Referral: Clinical Social Work     Living arrangements for the past 2 months: Single Family Home                 DME Arranged: N/A DME Agency: NA       HH Arranged: NA [place] Agency: NA         Social Determinants of Health (SDOH) Interventions    Readmission Risk Interventions Readmission Risk Prevention Plan 02/04/2021 10/28/2020 12/27/2019  Transportation Screening Complete Complete Complete  PCP or Specialist Appt within 3-5 Days - - Complete  [place]RI or [place] - - Complete  Medication Review (RN Care Manager) Complete Complete Complete  PCP or Specialist appointment within 3-5 days of discharge Complete Complete -  [place] or Home Care Consult Complete Complete -  SW Recovery Care/Counseling Consult Complete - -  Palliative Care Screening Not Applicable Not Applicable -  [place] Not Applicable Not Applicable -  Some recent data might be hidden

## 2021-02-24 NOTE — Progress Notes (Signed)
[facility]  ROUNDING NOTE   Subjective:   [name] is a 75 y.o.male with past medical history of chronic Atrial Fibrillation, CHF, diabetes, CKD stage 3, GERD and hypertension. He presents to the ED with generalized weakness for 3 days.   We were consulted for evaluate of acute kidney injury. Baseline Creatinine of 1.95, BUN 70, and EGFR 15 on admission.   Patient resting comfortably in the bed Patient offers no new specific physical complaints    Objective:  Vital signs in last 24 hours:  Temp:  [98 F (36.7 C)-99.7 F (37.6 C)] 98.1 F (36.7 C) (04/24 1129) Pulse Rate:  [49-62] 62 (04/24 1129) Resp:  [17-20] 18 (04/24 1129) BP: (89-155)/(47-108) 150/88 (04/24 1129) SpO2:  [92 %-99 %] 97 % (04/24 1129) Weight:  [82.1 kg] 82.1 kg (04/24 0431)  Weight change: -3.402 kg Filed Weights   02/22/21 0418 02/23/21 0444 02/24/21 0431  Weight: 81.3 kg 85.5 kg 82.1 kg    Intake/Output: I/O last 3 completed shifts: In: 3846 [P.O.:1440; I.V.:53; IV Piggyback:100] Out: 2950 [Urine:2950]   Intake/Output this shift:  Total I/O In: 934 [P.O.:720; I.V.:114; IV Piggyback:100] Out: 425 [Urine:425]  Physical Exam: General: NAD, anxious  Head: Normocephalic, atraumatic. Moist oral mucosal membranes  Eyes: Anicteric  Neck: Supple, trachea midline  Lungs:  Diminished in bases, tachypnea, CPAP in situ  Heart: Regular rate and rhythm  Abdomen:  Soft, nontender,   Extremities:  1+ peripheral edema.  Neurologic: Nonfocal, moving all four extremities  Skin: No lesions, red flaky BLE       Basic Metabolic Panel: Recent Labs  Lab 02/20/21 0755 02/21/21 0527 02/22/21 0447 02/23/21 0531 02/23/21 1606 02/24/21 0419  NA 133* 133* 136 138  --  138  K 5.8* 5.8* 5.1 4.7  --  4.3  CL 100 102 104 101  --  102  CO2 23 22 21* 23  --  25  GLUCOSE 74 163* 105* 133*  --  203*  BUN 68* 65* 59* 56*  --  53*  CREATININE 3.65* 3.33* 3.00* 2.95*  --  2.53*  CALCIUM 7.9* 8.0*  8.0* 8.7*  --  8.4*  PHOS  --   --   --   --  4.2  --     Liver Function Tests: Recent Labs  Lab 02/19/21 0849  AST 15  ALT 12  ALKPHOS 102  BILITOT 0.7  PROT 5.6*  ALBUMIN 2.8*   No results for input(s): LIPASE, AMYLASE in the last 168 hours. No results for input(s): AMMONIA in the last 168 hours.  CBC: Recent Labs  Lab 02/20/21 0755 02/21/21 0527 02/22/21 0447 02/22/21 1620 02/23/21 0531 02/24/21 0419  WBC 8.1 8.7 6.7  --  7.0 7.4  NEUTROABS 6.3 7.0 5.2  --  5.0 5.4  HGB 7.7* 7.6* 7.0* 8.3* 9.1* 9.0*  HCT 24.7* 25.7* 24.7*  --  29.3* 29.7*  MCV 76.5* 78.6* 81.8  --  77.5* 79.2*  PLT 266 275 259  --  261 264    Cardiac Enzymes: Recent Labs  Lab 02/19/21 1056  CKTOTAL 34*    BNP: Invalid input(s): POCBNP  CBG: Recent Labs  Lab 02/23/21 1134 02/23/21 1636 02/23/21 2029 02/24/21 0730 02/24/21 1128  GLUCAP 155* 205* 179* 150* 142*    Microbiology: Results for orders placed or performed during the hospital encounter of 02/19/21  Resp Panel by RT-PCR (Flu A&B, Covid) Nasopharyngeal Swab     Status: None   Collection Time: 02/19/21  9:56 AM   Specimen: Nasopharyngeal Swab; Nasopharyngeal(NP) swabs in vial transport medium  Result Value Ref Range Status   SARS Coronavirus 2 by RT PCR NEGATIVE NEGATIVE Final    Comment: (NOTE) SARS-CoV-2 target nucleic acids are NOT DETECTED.  The SARS-CoV-2 RNA is generally detectable in upper respiratory specimens during the acute phase of infection. The lowest concentration of SARS-CoV-2 viral copies this assay can detect is 138 copies/mL. A negative result does not preclude SARS-Cov-2 infection and should not be used as the sole basis for treatment or other patient management decisions. A negative result may occur with  improper specimen collection/handling, submission of specimen other than nasopharyngeal swab, presence of viral mutation(s) within the areas targeted by this assay, and inadequate number of  viral copies(<138 copies/mL). A negative result must be combined with clinical observations, patient history, and epidemiological information. The expected result is Negative.  Fact Sheet for Patients:  EntrepreneurPulse.com.au  Fact Sheet for Healthcare Providers:  IncredibleEmployment.be  This test is no t yet approved or cleared by the [place] FDA and  has been authorized for detection and/or diagnosis of SARS-CoV-2 by FDA under an Emergency Use Authorization (EUA). This EUA will remain  in effect (meaning this test can be used) for the duration of the COVID-19 declaration under Section 564(b)(1) of the Act, 21 U.S.C.section 360bbb-3(b)(1), unless the authorization is terminated  or revoked sooner.       Influenza A by PCR NEGATIVE NEGATIVE Final   Influenza B by PCR NEGATIVE NEGATIVE Final    Comment: (NOTE) The Xpert Xpress SARS-CoV-2/FLU/RSV plus assay is intended as an aid in the diagnosis of influenza from Nasopharyngeal swab specimens and should not be used as a sole basis for treatment. Nasal washings and aspirates are unacceptable for Xpert Xpress SARS-CoV-2/FLU/RSV testing.  Fact Sheet for Patients: EntrepreneurPulse.com.au  Fact Sheet for Healthcare Providers: IncredibleEmployment.be  This test is not yet approved or cleared by the [place] FDA and has been authorized for detection and/or diagnosis of SARS-CoV-2 by FDA under an Emergency Use Authorization (EUA). This EUA will remain in effect (meaning this test can be used) for the duration of the COVID-19 declaration under Section 564(b)(1) of the Act, 21 U.S.C. section 360bbb-3(b)(1), unless the authorization is terminated or revoked.  Performed at [facility], [address] [place], [place] [address]     Coagulation Studies: No results for input(s): LABPROT, INR in the last 72 hours.  Urinalysis: No  results for input(s): COLORURINE, LABSPEC, PHURINE, GLUCOSEU, HGBUR, BILIRUBINUR, KETONESUR, PROTEINUR, UROBILINOGEN, NITRITE, LEUKOCYTESUR in the last 72 hours.  Invalid input(s): APPERANCEUR    Imaging: No results found.   Medications:   . sodium chloride 250 mL (02/24/21 1235)  . ampicillin-sulbactam (UNASYN) IV 3 g (02/24/21 1237)  . furosemide (LASIX) 200 mg in dextrose 5% 100 mL (52m/mL) infusion 4 mg/hr (02/24/21 1024)   . sodium chloride   Intravenous Once  . acidophilus  1 capsule Oral Daily  . apixaban  5 mg Oral BID  . cholecalciferol  1,000 Units Oral Daily  . gabapentin  100 mg Oral BID  . hydrocortisone  25 mg Rectal BID  . insulin aspart  0-15 Units Subcutaneous TID WC  . iron polysaccharides  150 mg Oral Daily  . magnesium oxide  400 mg Oral Daily  . multivitamin-lutein  1 capsule Oral Daily  . nicotine  21 mg Transdermal Daily  . pantoprazole  40 mg Oral Daily  . Ensure Max Protein  11 oz  Oral BID BM  . simvastatin  20 mg Oral q AM  . tiotropium  18 mcg Inhalation Daily  . traZODone  50 mg Oral QHS   sodium chloride, acetaminophen, albuterol, ipratropium-albuterol, melatonin, ondansetron **OR** ondansetron (ZOFRAN) IV, oxyCODONE    Renal U/s  FINDINGS: Right Kidney:   Renal measurements: 9.8 x 4.5 x 4.0 cm = volume: 91.2 mL. Mild increased echogenicity. No mass or hydronephrosis visualized.   Left Kidney:   Renal measurements: 9.4 x 5.3 x 5.1 cm = volume: 132.8 mL. Mild increased echogenicity. No mass or hydronephrosis visualized.   Bladder:   Mild bladder distention cannot be excluded.   Other:   Small right pleural effusion cannot be excluded.   IMPRESSION: 1. Mild increased echogenicity both kidneys suggesting chronic medical renal disease. No acute renal abnormality identified.   2.  Bladder distention cannot be excluded.   3.  Small right pleural effusion cannot be excluded.     Assessment/ Plan:  [name] is a 75 y.o.   male with past medical history of chronic Atrial Fibrillation, CHF, diabetes, CKD stage 3, GERD and hypertension. He presents to the ED with generalized weakness for 3 days.   1)Renal    AKI secondary to  ATN AKI on CKD CKD stage 3b. CKD since 2015 CKD secondary to DM Progression of CKD marked with multiple AKI Creat 3.9==>2.5  AKI better   CKD work up Results for CREE, KUNERT (MRN [id number]) as of 02/23/2021 10:52  Ref. Range 02/20/2021 18:40  M Component, Ur Latest Ref Range: Not Observed % Not Observed   Results for GERRITT, GALENTINE (MRN [id number]) as of 02/23/2021 10:52  Ref. Range 02/20/2021 18:40  Total Protein, Urine-UPE24 Latest Ref Range: Not Estab. mg/dL 10.5    Results for JACHAI, OKAZAKI (MRN [id number]) as of 02/24/2021 15:18  Ref. Range 02/20/2021 10:43  Hepatitis B Surface Ag Latest Ref Range: NON REACTIVE  NON REACTIVE  Hep B S Ab Latest Ref Range: NON REACTIVE  NON REACTIVE  Hep B Core Ab, IgM Latest Ref Range: NON REACTIVE  NON REACTIVE  Hep B Core Total Ab Latest Ref Range: NON REACTIVE  NON REACTIVE  HCV Ab Latest Ref Range: NON REACTIVE  NON REACTIVE   Results for AYO, SMOAK (MRN [id number]) as of 02/24/2021 15:18  Ref. Range 12/26/2019 08:25  HIV Screen 4th Generation wRfx Latest Ref Range: Non Reactive  Non Reactive (A)     2)HTN    Blood pressure is stable    3)Anemia of chronic disease and iron deficiency anemia  CBC Latest Ref Rng & Units 02/24/2021 02/23/2021 02/22/2021  WBC 4.0 - 10.5 K/uL 7.4 7.0 -  Hemoglobin 13.0 - 17.0 g/dL 9.0(L) 9.1(L) 8.3(L)  Hematocrit 39.0 - 52.0 % 29.7(L) 29.3(L) -  Platelets 150 - 400 K/uL 264 261 -    Results for NIKESH, TESCHNER (MRN [id number]) as of 02/23/2021 15:43  Ref. Range 01/28/2021 05:43  Iron Latest Ref Range: 45 - 182 ug/dL 25 (L)  UIBC Latest Units: ug/dL 431  TIBC Latest Ref Range: 250 - 450 ug/dL 456 (H)  Saturation Ratios Latest Ref Range: 17.9 - 39.5 % 6 (L)   Patient did receive PRBC during this admission  HGb  at goal (9--11)   4) Secondary hyperparathyroidism -CKD Mineral-Bone Disorder    Lab Results  Component Value Date   CALCIUM 8.4 (L) 02/24/2021   CAION 1.17 10/02/2016   PHOS 4.2 02/23/2021    Secondary Hyperparathyroidism absent Phosphorus is at  goal  5)Acute on chronic diastolic congestive heart failure Improving  Patient is currently on Lasix drip   6) Electrolytes   BMP Latest Ref Rng & Units 02/24/2021 02/23/2021 02/22/2021  Glucose 70 - 99 mg/dL 203(H) 133(H) 105(H)  BUN 8 - 23 mg/dL 53(H) 56(H) 59(H)  Creatinine 0.61 - 1.24 mg/dL 2.53(H) 2.95(H) 3.00(H)  Sodium 135 - 145 mmol/L 138 138 136  Potassium 3.5 - 5.1 mmol/L 4.3 4.7 5.1  Chloride 98 - 111 mmol/L 102 101 104  CO2 22 - 32 mmol/L 25 23 21(L)  Calcium 8.9 - 10.3 mg/dL 8.4(L) 8.7(L) 8.0(L)     Sodium Patient was hyponatremic earlier Now better   Potassium Hyperkalemic Now better    7)Acid base   Co2 at goal    Plan  Patient AKI is improving We will continue the current treatment plan for now   LOS: 5 Zanylah Hardie s [facility] 4/24/20223:18 PM

## 2021-02-24 NOTE — Progress Notes (Signed)
[facility] Cardiology    SUBJECTIVE: Patient states he feels reasonably well slightly weak denies any chest pain no shortness of breath.  Generalized weakness occasional fall risk.  Mild shortness of breath feels reasonably well   Vitals:   02/24/21 0431 02/24/21 0740 02/24/21 0817 02/24/21 1129  BP: (!) 155/59 (!) 89/62 (!) 143/108 (!) 150/88  Pulse: (!) 51 61 (!) 59 62  Resp: 18 17 20 18   Temp: 98.7 F (37.1 C) 98 F (36.7 C) 98.2 F (36.8 C) 98.1 F (36.7 C)  TempSrc:   Oral Oral  SpO2: 99%  98% 97%  Weight: 82.1 kg     Height:         Intake/Output Summary (Last 24 hours) at 02/24/2021 1236 Last data filed at 02/24/2021 0935 Gross per 24 hour  Intake 1832.96 ml  Output 1875 ml  Net -42.04 ml      PHYSICAL EXAM  General: Well developed, well nourished, in no acute distress HEENT:  Normocephalic and atramatic Neck:  No JVD.  Lungs: Clear bilaterally to auscultation and percussion. Heart: HRRR . Normal S1 and S2 without gallops or murmurs.  Abdomen: Bowel sounds are positive, abdomen soft and non-tender  Msk:  Back normal, normal gait. Normal strength and tone for age. Extremities: No clubbing, cyanosis or edema.   Neuro: Alert and oriented X 3. Psych:  Good affect, responds appropriately   LABS: Basic Metabolic Panel: Recent Labs    02/23/21 0531 02/23/21 1606 02/24/21 0419  NA 138  --  138  K 4.7  --  4.3  CL 101  --  102  CO2 23  --  25  GLUCOSE 133*  --  203*  BUN 56*  --  53*  CREATININE 2.95*  --  2.53*  CALCIUM 8.7*  --  8.4*  PHOS  --  4.2  --    Liver Function Tests: No results for input(s): AST, ALT, ALKPHOS, BILITOT, PROT, ALBUMIN in the last 72 hours. No results for input(s): LIPASE, AMYLASE in the last 72 hours. CBC: Recent Labs    02/23/21 0531 02/24/21 0419  WBC 7.0 7.4  NEUTROABS 5.0 5.4  HGB 9.1* 9.0*  HCT 29.3* 29.7*  MCV 77.5* 79.2*  PLT 261 264   Cardiac Enzymes: No results for input(s): CKTOTAL, CKMB, CKMBINDEX, TROPONINI in  the last 72 hours. BNP: Invalid input(s): POCBNP D-Dimer: No results for input(s): DDIMER in the last 72 hours. Hemoglobin A1C: No results for input(s): HGBA1C in the last 72 hours. Fasting Lipid Panel: No results for input(s): CHOL, HDL, LDLCALC, TRIG, CHOLHDL, LDLDIRECT in the last 72 hours. Thyroid Function Tests: No results for input(s): TSH, T4TOTAL, T3FREE, THYROIDAB in the last 72 hours.  Invalid input(s): FREET3 Anemia Panel: No results for input(s): VITAMINB12, FOLATE, FERRITIN, TIBC, IRON, RETICCTPCT in the last 72 hours.  No results found.   Echo preserved overall left ventricular function  TELEMETRY: Atrial fibrillation rate of 55  ASSESSMENT AND PLAN:  Principal Problem:   AKI (acute kidney injury) ([place]) Active Problems:   Chronic kidney disease (CKD) stage G3b/A3, moderately decreased glomerular filtration rate (GFR) between 30-44 mL/min/1.73 square meter and albuminuria creatinine ratio greater than 300 mg/g (HCC)   History of CVA (cerebrovascular accident)   Chronic diastolic CHF (congestive heart failure) (HCC)   Atrial fibrillation, chronic (HCC)   Type 2 diabetes mellitus with hypoglycemia without coma (HCC)   Bradycardia   Weakness generalized   Dental infection   Pressure injury of skin  Plan Agree with nephrology input for renal insufficiency Continue modest hydration Continue therapy for CVA Rate control for atrial fibrillation Continue diabetes management and control Continue current therapy for reflux type symptoms Maintain Lasix therapy for heart failure CPAP for obstructive sleep apnea Continue inhalers for COPD therapy     Yolonda Kida, MD 02/24/2021 12:36 PM

## 2021-02-24 NOTE — Progress Notes (Signed)
PROGRESS NOTE    [name]  WJX:[id number] DOB: May 25, 1946 DOA: 02/19/2021 PCP: [name], MD   Brief Narrative:  [name] is a 75 y.o. 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 Patient states that he has had weakness for the last 3 days which has made it difficult for him to ambulate.  This morning he states he was unable to get off the toilet and actually had a fall this morning. He denies feeling dizzy or lightheaded and complains of just generalized weakness. He denies having any chest pain, no shortness of breath, no fever, no chills, no headache, no abdominal pain, no nausea, no vomiting, no diarrhea, no urinary symptoms, no blurred vision, no difficulty swallowing, no headache.  Found to be bradycardic, hypoglycemic, borderline hypotensive.  Found to have significant AKI thought to be due to overdiuresis.  Case discussed with cardiology and nephrology.  Recommendations appreciated.  4/21: This morning patient was complaining of worsening shortness of breath.  Stat chest x-ray demonstrates fluid overload.  Administered 60 mg IV Lasix.  CPAP ordered.  4/22: Patient still endorsing shortness of breath although improved from prior.  Tolerated BiPAP okay.  Does better with it on. 4/23: On Lasix infusion.  Continues to require CPAP. 4/24: Slowly improving.  Alternate between 4 L nasal cannula and CPAP. remains on Lasix drip    Assessment & Plan:   Principal Problem:   AKI (acute kidney injury) ([place]) Active Problems:   Chronic kidney disease (CKD) stage G3b/A3, moderately decreased glomerular filtration rate (GFR) between 30-44 mL/min/1.73 square meter and albuminuria creatinine ratio greater than 300 mg/g  (HCC)   History of CVA (cerebrovascular accident)   Chronic diastolic CHF (congestive heart failure) (HCC)   Atrial fibrillation, chronic (HCC)   Type 2 diabetes mellitus with hypoglycemia without coma (HCC)   Bradycardia   Weakness generalized   Dental infection   Pressure injury of skin  Acute kidney injury on chronic kidney disease stage IIIb Hyperkalemia Hyponatremia Thought to be secondary to overdiuresis Developed edema on chest x-ray At baseline patient has a serum creatinine of 1.95, on admission his creatinine is 3.95 [place] [facility] consulted Plan: No further IV fluids at this time.  Patient highly sensitive to IVF and develops pulmonary edema very rapidly Lasix GTT with albumin per nephrology recommendations Careful monitoring of volume status Daily BMP Possible transition to bolus lasix dosing, defer to nephrology  Acute on chronic anemia Hb drifting down to 7 on 4/22 Patient with cardiac history Ideal Hb goal 8 Plan: Continue Eliquis.  No further transfusions at this time.  Daily CBC  Atrial fibrillation with slow ventricular response Chronic issue per Dr. Ubaldo Glassing who the patient is well-known to Plan: Avoid AV nodal blocking agents Continue apixaban for anticoagulation, may need dose reduction if kidney function does not recover  Diabetes mellitus with complications of stage III chronic kidney disease and hypoglycemia Hold insulin Blood sugar checks before meals and at bedtime Encourage oral intake   COPD with acute on chronic respiratory failure Not acutely exacerbated Continue Spiriva, as needed bronchodilator therapy and inhaled steroids Wean oxygen as tolerated   Acute on chronic diastolic congestive heart failure Diuresis Lasix GTT with supplemental albumin Holding home diuretics   Coronary artery disease Stable  and not acutely exacerbated Continue statins and aspirin Hold nitrates    Generalized weakness/fall   Multifactorial and related to bradycardia as well as hypotension Place patient on fall precautions Therapy evals when able    Dental infection Patient noted to have purulence around his gum line Cervical spine CT shows an expansile lucent lesion with pathologic fracture  identified involving the midline the maxilla. Recommend further evaluation with dedicated maxillofacial CT with contrast. ENT was consulted in the ER and he recommends to obtain maxillofacial CT with contrast once renal function improves Continue Unasyn for now   DVT prophylaxis: Eliquis Code Status: Full Family Communication: Wife Vernie Ammons [phone] on 4/22 Disposition Plan: Status is: Inpatient  Remains inpatient appropriate because:Inpatient level of care appropriate due to severity of illness   Dispo: The patient is from: Home              Anticipated d/c is to: Home              Patient currently is not medically stable to d/c.   Difficult to place patient No  AKI on CKD.  Remains on Lasix GTT.     Level of care: Progressive Cardiac  Consultants:  Nephrology Cardiology   Procedures:None  Antimicrobials:   Unasyn   Subjective: Seen and examined.  Reports improvement in respiratory status.  Endorses poor sleep  Objective: Vitals:   02/24/21 0431 02/24/21 0740 02/24/21 0817 02/24/21 1129  BP: (!) 155/59 (!) 89/62 (!) 143/108 (!) 150/88  Pulse: (!) 51 61 (!) 59 62  Resp: 18 17 20 18   Temp: 98.7 F (37.1 C) 98 F (36.7 C) 98.2 F (36.8 C) 98.1 F (36.7 C)  TempSrc:   Oral Oral  SpO2: 99%  98% 97%  Weight: 82.1 kg     Height:        Intake/Output Summary (Last 24 hours) at 02/24/2021 1202 Last data filed at 02/24/2021 0935 Gross per 24 hour  Intake 1832.96 ml  Output 1875 ml  Net -42.04 ml   Filed Weights   02/22/21 0418 02/23/21 0444 02/24/21 0431  Weight: 81.3 kg 85.5 kg 82.1 kg    Examination:  General exam: No acute distress Respiratory system: Normal work of  breathing.  4 L.  Bibasilar crackles  cardiovascular system: S1-S2, bradycardic, no appreciable murmurs, trace np edema BL Gastrointestinal system: Abdomen is nondistended, soft and nontender. No organomegaly or masses felt. Normal bowel sounds heard. Central nervous system: Alert, oriented x2, no focal deficits, lethargic extremities: Symmetric 5 x 5 power. Skin: No rashes, lesions or ulcers Psychiatry: Judgement and insight appear impaired. Mood & affect flattened.     Data Reviewed: I have personally reviewed following labs and imaging studies  CBC: Recent Labs  Lab 02/20/21 0755 02/21/21 0527 02/22/21 0447 02/22/21 1620 02/23/21 0531 02/24/21 0419  WBC 8.1 8.7 6.7  --  7.0 7.4  NEUTROABS 6.3 7.0 5.2  --  5.0 5.4  HGB 7.7* 7.6* 7.0* 8.3* 9.1* 9.0*  HCT 24.7* 25.7* 24.7*  --  29.3* 29.7*  MCV 76.5* 78.6* 81.8  --  77.5* 79.2*  PLT 266 275 259  --  261 644   Basic Metabolic Panel: Recent Labs  Lab 02/20/21 0755 02/21/21 0527 02/22/21 0447 02/23/21 0531 02/23/21 1606 02/24/21 0419  NA 133* 133* 136 138  --  138  K 5.8* 5.8* 5.1 4.7  --  4.3  CL 100 102 104 101  --  102  CO2 23 22 21* 23  --  25  GLUCOSE 74 163* 105* 133*  --  203*  BUN 68* 65* 59* 56*  --  53*  CREATININE 3.65* 3.33* 3.00* 2.95*  --  2.53*  CALCIUM 7.9* 8.0* 8.0* 8.7*  --  8.4*  PHOS  --   --   --   --  4.2  --    GFR: Estimated Creatinine Clearance: 25.4 mL/min (A) (by C-G formula based on SCr of 2.53 mg/dL (H)). Liver Function Tests: Recent Labs  Lab 02/19/21 0849  AST 15  ALT 12  ALKPHOS 102  BILITOT 0.7  PROT 5.6*  ALBUMIN 2.8*   No results for input(s): LIPASE, AMYLASE in the last 168 hours. No results for input(s): AMMONIA in the last 168 hours. Coagulation Profile: No results for input(s): INR, PROTIME in the last 168 hours. Cardiac Enzymes: Recent Labs  Lab 02/19/21 1056  CKTOTAL 34*   BNP (last 3 results) No results for input(s): PROBNP in the last 8760 hours. HbA1C: No  results for input(s): HGBA1C in the last 72 hours. CBG: Recent Labs  Lab 02/23/21 1134 02/23/21 1636 02/23/21 2029 02/24/21 0730 02/24/21 1128  GLUCAP 155* 205* 179* 150* 142*   Lipid Profile: No results for input(s): CHOL, HDL, LDLCALC, TRIG, CHOLHDL, LDLDIRECT in the last 72 hours. Thyroid Function Tests: No results for input(s): TSH, T4TOTAL, FREET4, T3FREE, THYROIDAB in the last 72 hours. Anemia Panel: No results for input(s): VITAMINB12, FOLATE, FERRITIN, TIBC, IRON, RETICCTPCT in the last 72 hours. Sepsis Labs: No results for input(s): PROCALCITON, LATICACIDVEN in the last 168 hours.  Recent Results (from the past 240 hour(s))  Resp Panel by RT-PCR (Flu A&B, Covid) Nasopharyngeal Swab     Status: None   Collection Time: 02/19/21  9:56 AM   Specimen: Nasopharyngeal Swab; Nasopharyngeal(NP) swabs in vial transport medium  Result Value Ref Range Status   SARS Coronavirus 2 by RT PCR NEGATIVE NEGATIVE Final    Comment: (NOTE) SARS-CoV-2 target nucleic acids are NOT DETECTED.  The SARS-CoV-2 RNA is generally detectable in upper respiratory specimens during the acute phase of infection. The lowest concentration of SARS-CoV-2 viral copies this assay can detect is 138 copies/mL. A negative result does not preclude SARS-Cov-2 infection and should not be used as the sole basis for treatment or other patient management decisions. A negative result may occur with  improper specimen collection/handling, submission of specimen other than nasopharyngeal swab, presence of viral mutation(s) within the areas targeted by this assay, and inadequate number of viral copies(<138 copies/mL). A negative result must be combined with clinical observations, patient history, and epidemiological information. The expected result is Negative.  Fact Sheet for Patients:  EntrepreneurPulse.com.au  Fact Sheet for Healthcare Providers:   IncredibleEmployment.be  This test is no t yet approved or cleared by the [place] FDA and  has been authorized for detection and/or diagnosis of SARS-CoV-2 by FDA under an Emergency Use Authorization (EUA). This EUA will remain  in effect (meaning this test can be used) for the duration of the COVID-19 declaration under Section 564(b)(1) of the Act, 21 U.S.C.section 360bbb-3(b)(1), unless the authorization is terminated  or revoked sooner.       Influenza A by PCR NEGATIVE NEGATIVE Final   Influenza B by PCR NEGATIVE NEGATIVE Final    Comment: (NOTE) The Xpert Xpress SARS-CoV-2/FLU/RSV plus assay is intended as an aid in the diagnosis of influenza from Nasopharyngeal swab specimens and should not be used as a sole basis for treatment. Nasal washings and aspirates are  unacceptable for Xpert Xpress SARS-CoV-2/FLU/RSV testing.  Fact Sheet for Patients: EntrepreneurPulse.com.au  Fact Sheet for Healthcare Providers: IncredibleEmployment.be  This test is not yet approved or cleared by the [place] FDA and has been authorized for detection and/or diagnosis of SARS-CoV-2 by FDA under an Emergency Use Authorization (EUA). This EUA will remain in effect (meaning this test can be used) for the duration of the COVID-19 declaration under Section 564(b)(1) of the Act, 21 U.S.C. section 360bbb-3(b)(1), unless the authorization is terminated or revoked.  Performed at [facility], [address], [place], [place] [address]          Radiology Studies: No results found.      Scheduled Meds: . sodium chloride   Intravenous Once  . acidophilus  1 capsule Oral Daily  . apixaban  5 mg Oral BID  . cholecalciferol  1,000 Units Oral Daily  . gabapentin  100 mg Oral BID  . hydrocortisone  25 mg Rectal BID  . insulin aspart  0-15 Units Subcutaneous TID WC  . iron polysaccharides  150 mg Oral Daily  .  magnesium oxide  400 mg Oral Daily  . multivitamin-lutein  1 capsule Oral Daily  . nicotine  21 mg Transdermal Daily  . pantoprazole  40 mg Oral Daily  . Ensure Max Protein  11 oz Oral BID BM  . simvastatin  20 mg Oral q AM  . tiotropium  18 mcg Inhalation Daily  . traZODone  50 mg Oral QHS   Continuous Infusions: . sodium chloride 250 mL (02/23/21 1258)  . ampicillin-sulbactam (UNASYN) IV 3 g (02/24/21 0039)  . furosemide (LASIX) 200 mg in dextrose 5% 100 mL (2mg /mL) infusion 4 mg/hr (02/24/21 1024)     LOS: 5 days    Time spent: 25 minutes    Sidney Ace, MD [facility] Pager [phone]  If 7PM-7AM, please contact night-coverage 02/24/2021, 12:02 PM

## 2021-02-24 NOTE — Progress Notes (Signed)
PT Cancellation Note  Patient Details Name: [name] MRN: [id number] DOB: October 03, 1946   Cancelled Treatment:    Reason Eval/Treat Not Completed: Medical issues which prohibited therapy   RN in with session stating pt has been SOB all day and pt nodded in agreement.  Will continue as appropriate at a later date.    Chesley Noon 02/24/2021, 1:14 PM

## 2021-02-25 ENCOUNTER — Ambulatory Visit: Payer: PPO | Admitting: Dermatology

## 2021-02-25 DIAGNOSIS — N179 Acute kidney failure, unspecified: Secondary | ICD-10-CM | POA: Diagnosis not present

## 2021-02-25 LAB — CBC WITH DIFFERENTIAL/PLATELET
Abs Immature Granulocytes: 0.02 10*3/uL (ref 0.00–0.07)
Basophils Absolute: 0 10*3/uL (ref 0.0–0.1)
Basophils Relative: 1 %
Eosinophils Absolute: 0.2 10*3/uL (ref 0.0–0.5)
Eosinophils Relative: 3 %
HCT: 28.7 % — ABNORMAL LOW (ref 39.0–52.0)
Hemoglobin: 8.5 g/dL — ABNORMAL LOW (ref 13.0–17.0)
Immature Granulocytes: 0 %
Lymphocytes Relative: 18 %
Lymphs Abs: 1 10*3/uL (ref 0.7–4.0)
MCH: 23.6 pg — ABNORMAL LOW (ref 26.0–34.0)
MCHC: 29.6 g/dL — ABNORMAL LOW (ref 30.0–36.0)
MCV: 79.7 fL — ABNORMAL LOW (ref 80.0–100.0)
Monocytes Absolute: 0.5 10*3/uL (ref 0.1–1.0)
Monocytes Relative: 9 %
Neutro Abs: 3.9 10*3/uL (ref 1.7–7.7)
Neutrophils Relative %: 69 %
Platelets: 254 10*3/uL (ref 150–400)
RBC: 3.6 MIL/uL — ABNORMAL LOW (ref 4.22–5.81)
RDW: 20.2 % — ABNORMAL HIGH (ref 11.5–15.5)
WBC: 5.7 10*3/uL (ref 4.0–10.5)
nRBC: 0 % (ref 0.0–0.2)

## 2021-02-25 LAB — GLUCOSE, CAPILLARY
Glucose-Capillary: 151 mg/dL — ABNORMAL HIGH (ref 70–99)
Glucose-Capillary: 199 mg/dL — ABNORMAL HIGH (ref 70–99)
Glucose-Capillary: 171 mg/dL — ABNORMAL HIGH (ref 70–99)
Glucose-Capillary: 151 mg/dL — ABNORMAL HIGH (ref 70–99)

## 2021-02-25 LAB — CREATININE, SERUM
Creatinine, Ser: 2.42 mg/dL — ABNORMAL HIGH (ref 0.61–1.24)
GFR, Estimated: 27 mL/min — ABNORMAL LOW (ref >60)

## 2021-02-25 NOTE — Progress Notes (Signed)
OT Cancellation Note  Patient Details Name: [name] MRN: [id number] DOB: 07/03/1946   Cancelled Treatment:    Reason Eval/Treat Not Completed: Patient declined, no reason specified. Upon attempt, pt endorsing feeling SOB and politely declines therapy. OT did assist to verify his lunch order was placed, per pt's request. Pt agreeable to OT re-attempt next date.   Hanley Hays, MPH, MS, OTR/L ascom [phone] 02/25/21, 11:51 AM

## 2021-02-25 NOTE — Care Management Important Message (Signed)
Important Message  Patient Details  Name: [name] MRN: [id number] Date of Birth: [birth year]   Medicare Important Message Given:  Yes     Dannette Barbara 02/25/2021, 11:31 AM

## 2021-02-25 NOTE — Progress Notes (Signed)
PROGRESS NOTE    [name]  XHB:[id number] DOB: April 20, 1946 DOA: 02/19/2021 PCP: [name], MD   Brief Narrative:  [name] is a 75 y.o. 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 Patient states that he has had weakness for the last 3 days which has made it difficult for him to ambulate.  This morning he states he was unable to get off the toilet and actually had a fall this morning. He denies feeling dizzy or lightheaded and complains of just generalized weakness. He denies having any chest pain, no shortness of breath, no fever, no chills, no headache, no abdominal pain, no nausea, no vomiting, no diarrhea, no urinary symptoms, no blurred vision, no difficulty swallowing, no headache.  Found to be bradycardic, hypoglycemic, borderline hypotensive.  Found to have significant AKI thought to be due to overdiuresis.  Case discussed with cardiology and nephrology.  Recommendations appreciated.  4/21: This morning patient was complaining of worsening shortness of breath.  Stat chest x-ray demonstrates fluid overload.  Administered 60 mg IV Lasix.  CPAP ordered.  4/22: Patient still endorsing shortness of breath although improved from prior.  Tolerated BiPAP okay.  Does better with it on. 4/23: On Lasix infusion.  Continues to require CPAP. 4/24: Slowly improving.  Alternate between 4 L nasal cannula and CPAP. remains on Lasix drip    Assessment & Plan:   Principal Problem:   AKI (acute kidney injury) ([place]) Active Problems:   Chronic kidney disease (CKD) stage G3b/A3, moderately decreased glomerular filtration rate (GFR) between 30-44 mL/min/1.73 square meter and albuminuria creatinine ratio greater than 300 mg/g  (HCC)   History of CVA (cerebrovascular accident)   Chronic diastolic CHF (congestive heart failure) (HCC)   Atrial fibrillation, chronic (HCC)   Type 2 diabetes mellitus with hypoglycemia without coma (HCC)   Bradycardia   Weakness generalized   Dental infection   Pressure injury of skin  Acute kidney injury on chronic kidney disease stage IIIb Hyperkalemia Hyponatremia Thought to be secondary to overdiuresis Developed edema on chest x-ray At baseline patient has a serum creatinine of 1.95, on admission his creatinine is 3.95 [place] [facility] consulted Plan: No IV fluids Lasix GTT with albumin per nephrology recommendations Careful monitoring of volume status Daily BMP Possible transition to bolus lasix dosing, defer to nephrology  Acute on chronic anemia Hb drifting down to 7 on 4/22 Patient with cardiac history Ideal Hb goal 8 Plan: Continue Eliquis.  No further transfusions at this time.  Daily CBC  Atrial fibrillation with slow ventricular response Chronic issue per Dr. Ubaldo Glassing who the patient is well-known to Plan: Avoid AV nodal blocking agents Continue apixaban for anticoagulation, may need dose reduction if kidney function does not recover  Diabetes mellitus with complications of stage III chronic kidney disease and hypoglycemia Hold insulin Blood sugar checks before meals and at bedtime Encourage oral intake   COPD with acute on chronic respiratory failure Not acutely exacerbated Continue Spiriva, as needed bronchodilator therapy and inhaled steroids Wean oxygen as tolerated   Acute on chronic diastolic congestive heart failure Diuresis Lasix GTT with supplemental albumin Holding home diuretics   Coronary artery disease Stable and not acutely exacerbated Continue statins and aspirin Hold nitrates    Generalized weakness/fall  Multifactorial and related to bradycardia as well as hypotension Place patient on fall  precautions Therapy evals when able    Dental infection Patient noted to have purulence around his gum line Cervical spine CT shows an expansile lucent lesion with pathologic fracture  identified involving the midline the maxilla. Recommend further evaluation with dedicated maxillofacial CT with contrast. ENT was consulted in the ER and he recommends to obtain maxillofacial CT with contrast once renal function improves Continue Unasyn for now   DVT prophylaxis: Eliquis Code Status: Full Family Communication: Wife Hilding Quintanar [phone] on 4/25 Disposition Plan: Status is: Inpatient  Remains inpatient appropriate because:Inpatient level of care appropriate due to severity of illness   Dispo: The patient is from: Home              Anticipated d/c is to: Home              Patient currently is not medically stable to d/c.   Difficult to place patient No  AKI on CKD.  Remains on Lasix GTT.     Level of care: Progressive Cardiac  Consultants:  Nephrology Cardiology   Procedures:None  Antimicrobials:   Unasyn   Subjective: Patient seen and examined.  Continues to be fatigued.  Slight improvement in respiratory status.  Objective: Vitals:   02/24/21 1941 02/25/21 0326 02/25/21 0757 02/25/21 1212  BP: (!) 139/46 (!) 149/59 (!) 110/97 (!) 117/47  Pulse: (!) 50 94 (!) 55 (!) 51  Resp: 19 18 18 18   Temp: 97.7 F (36.5 C) 97.6 F (36.4 C) 97.9 F (36.6 C) 98 F (36.7 C)  TempSrc: Oral  Oral Oral  SpO2: 92% 100% 94% 100%  Weight:  82.7 kg    Height:        Intake/Output Summary (Last 24 hours) at 02/25/2021 1309 Last data filed at 02/25/2021 1007 Gross per 24 hour  Intake 1280 ml  Output 2750 ml  Net -1470 ml   Filed Weights   02/23/21 0444 02/24/21 0431 02/25/21 0326  Weight: 85.5 kg 82.1 kg 82.7 kg    Examination:  General exam: No acute distress Respiratory system: Bibasilar crackles.  Normal work of breathing.  CPAP  cardiovascular system:  S1-S2, regular rate and rhythm, no murmurs, trace pedal edema  gastrointestinal system: Abdomen is nondistended, soft and nontender. No organomegaly or masses felt. Normal bowel sounds heard. Central nervous system: Alert, oriented x2, no focal deficits, lethargic extremities: Symmetric 5 x 5 power. Skin: No rashes, lesions or ulcers Psychiatry: Judgement and insight appear impaired. Mood & affect flattened.     Data Reviewed: I have personally reviewed following labs and imaging studies  CBC: Recent Labs  Lab 02/21/21 0527 02/22/21 0447 02/22/21 1620 02/23/21 0531 02/24/21 0419 02/25/21 0531  WBC 8.7 6.7  --  7.0 7.4 5.7  NEUTROABS 7.0 5.2  --  5.0 5.4 3.9  HGB 7.6* 7.0* 8.3* 9.1* 9.0* 8.5*  HCT 25.7* 24.7*  --  29.3* 29.7* 28.7*  MCV 78.6* 81.8  --  77.5* 79.2* 79.7*  PLT 275 259  --  261 264 793   Basic Metabolic Panel: Recent Labs  Lab 02/20/21 0755 02/21/21 0527 02/22/21 0447 02/23/21 0531 02/23/21 1606 02/24/21 0419 02/25/21 0531  NA 133* 133* 136 138  --  138  --   K 5.8* 5.8* 5.1 4.7  --  4.3  --   CL 100 102 104 101  --  102  --   CO2 23 22 21* 23  --  25  --   GLUCOSE 74 163* 105* 133*  --  203*  --   BUN 68* 65* 59* 56*  --  53*  --   CREATININE 3.65* 3.33* 3.00* 2.95*  --  2.53* 2.42*  CALCIUM 7.9* 8.0* 8.0* 8.7*  --  8.4*  --   PHOS  --   --   --   --  4.2  --   --    GFR: Estimated Creatinine Clearance: 26.6 mL/min (A) (by C-G formula based on SCr of 2.42 mg/dL (H)). Liver Function Tests: Recent Labs  Lab 02/19/21 0849  AST 15  ALT 12  ALKPHOS 102  BILITOT 0.7  PROT 5.6*  ALBUMIN 2.8*   No results for input(s): LIPASE, AMYLASE in the last 168 hours. No results for input(s): AMMONIA in the last 168 hours. Coagulation Profile: No results for input(s): INR, PROTIME in the last 168 hours. Cardiac Enzymes: Recent Labs  Lab 02/19/21 1056  CKTOTAL 34*   BNP (last 3 results) No results for input(s): PROBNP in the last 8760 hours. HbA1C: No  results for input(s): HGBA1C in the last 72 hours. CBG: Recent Labs  Lab 02/24/21 1128 02/24/21 1626 02/24/21 2025 02/25/21 0803 02/25/21 1213  GLUCAP 142* 181* 205* 151* 199*   Lipid Profile: No results for input(s): CHOL, HDL, LDLCALC, TRIG, CHOLHDL, LDLDIRECT in the last 72 hours. Thyroid Function Tests: No results for input(s): TSH, T4TOTAL, FREET4, T3FREE, THYROIDAB in the last 72 hours. Anemia Panel: No results for input(s): VITAMINB12, FOLATE, FERRITIN, TIBC, IRON, RETICCTPCT in the last 72 hours. Sepsis Labs: No results for input(s): PROCALCITON, LATICACIDVEN in the last 168 hours.  Recent Results (from the past 240 hour(s))  Resp Panel by RT-PCR (Flu A&B, Covid) Nasopharyngeal Swab     Status: None   Collection Time: 02/19/21  9:56 AM   Specimen: Nasopharyngeal Swab; Nasopharyngeal(NP) swabs in vial transport medium  Result Value Ref Range Status   SARS Coronavirus 2 by RT PCR NEGATIVE NEGATIVE Final    Comment: (NOTE) SARS-CoV-2 target nucleic acids are NOT DETECTED.  The SARS-CoV-2 RNA is generally detectable in upper respiratory specimens during the acute phase of infection. The lowest concentration of SARS-CoV-2 viral copies this assay can detect is 138 copies/mL. A negative result does not preclude SARS-Cov-2 infection and should not be used as the sole basis for treatment or other patient management decisions. A negative result may occur with  improper specimen collection/handling, submission of specimen other than nasopharyngeal swab, presence of viral mutation(s) within the areas targeted by this assay, and inadequate number of viral copies(<138 copies/mL). A negative result must be combined with clinical observations, patient history, and epidemiological information. The expected result is Negative.  Fact Sheet for Patients:  EntrepreneurPulse.com.au  Fact Sheet for Healthcare Providers:   IncredibleEmployment.be  This test is no t yet approved or cleared by the [place] FDA and  has been authorized for detection and/or diagnosis of SARS-CoV-2 by FDA under an Emergency Use Authorization (EUA). This EUA will remain  in effect (meaning this test can be used) for the duration of the COVID-19 declaration under Section 564(b)(1) of the Act, 21 U.S.C.section 360bbb-3(b)(1), unless the authorization is terminated  or revoked sooner.       Influenza A by PCR NEGATIVE NEGATIVE Final   Influenza B by PCR NEGATIVE NEGATIVE Final    Comment: (NOTE) The Xpert Xpress SARS-CoV-2/FLU/RSV plus assay is intended as an aid in the diagnosis of influenza from Nasopharyngeal swab specimens  and should not be used as a sole basis for treatment. Nasal washings and aspirates are unacceptable for Xpert Xpress SARS-CoV-2/FLU/RSV testing.  Fact Sheet for Patients: EntrepreneurPulse.com.au  Fact Sheet for Healthcare Providers: IncredibleEmployment.be  This test is not yet approved or cleared by the [place] FDA and has been authorized for detection and/or diagnosis of SARS-CoV-2 by FDA under an Emergency Use Authorization (EUA). This EUA will remain in effect (meaning this test can be used) for the duration of the COVID-19 declaration under Section 564(b)(1) of the Act, 21 U.S.C. section 360bbb-3(b)(1), unless the authorization is terminated or revoked.  Performed at [facility], [address] [place], [place] [address]          Radiology Studies: No results found.      Scheduled Meds: . sodium chloride   Intravenous Once  . acidophilus  1 capsule Oral Daily  . apixaban  5 mg Oral BID  . cholecalciferol  1,000 Units Oral Daily  . gabapentin  100 mg Oral BID  . hydrocortisone  25 mg Rectal BID  . insulin aspart  0-15 Units Subcutaneous TID WC  . iron polysaccharides  150 mg Oral Daily  .  magnesium oxide  400 mg Oral Daily  . multivitamin-lutein  1 capsule Oral Daily  . nicotine  21 mg Transdermal Daily  . pantoprazole  40 mg Oral Daily  . Ensure Max Protein  11 oz Oral BID BM  . simvastatin  20 mg Oral q AM  . tiotropium  18 mcg Inhalation Daily  . traZODone  50 mg Oral QHS   Continuous Infusions: . sodium chloride 250 mL (02/24/21 1235)  . ampicillin-sulbactam (UNASYN) IV 3 g (02/25/21 1224)  . furosemide (LASIX) 200 mg in dextrose 5% 100 mL (2mg /mL) infusion 4 mg/hr (02/24/21 1024)     LOS: 6 days    Time spent: 15 minutes    Sidney Ace, MD [facility] Pager [phone]  If 7PM-7AM, please contact night-coverage 02/25/2021, 1:09 PM

## 2021-02-25 NOTE — TOC Progression Note (Signed)
Transition of Care [facility]) - Progression Note    Patient Details  Name: [name] MRN: [id number] Date of Birth: Jan 07, 1946  Transition of Care [facility]) CM/SW [place], RN Phone Number: 02/25/2021, 10:03 AM  Clinical Narrative: Discussed with wife SNF for discharge per PT/OT recommendation. Wife agrees with SNF, however feels that patient will not due to bad prior experience. Wife states to proceed with the search and she will discuss with patient. Wife advised to go to Medicare website to review SNF's in this area to choose based on availability and Insurance. Wife receptive to me starting the search and then discuss acceptance with her to make decision.      Expected Discharge Plan: [place] Barriers to Discharge: Continued Medical Work up  Expected Discharge Plan and Services Expected Discharge Plan: [place] In-house Referral: Clinical Social Work     Living arrangements for the past 2 months: Single Family Home                 DME Arranged: N/A DME Agency: NA       HH Arranged: NA [place] Agency: NA         Social Determinants of Health (SDOH) Interventions    Readmission Risk Interventions Readmission Risk Prevention Plan 02/04/2021 10/28/2020 12/27/2019  Transportation Screening Complete Complete Complete  PCP or Specialist Appt within 3-5 Days - - Complete  HRI or [place] - - Complete  Medication Review (RN Care Manager) Complete Complete Complete  PCP or Specialist appointment within 3-5 days of discharge Complete Complete -  [place] or Home Care Consult Complete Complete -  SW Recovery Care/Counseling Consult Complete - -  Palliative Care Screening Not Applicable Not Applicable -  [place] Not Applicable Not Applicable -  Some recent data might be hidden

## 2021-02-25 NOTE — Progress Notes (Signed)
[name] Cardiology  Patient Description: [name] is a 75 year old male with PMH significant for CAD s/p CABG (2006), h/o HFrEF (most recent EF = 50-55%), paroxysmal atrial fibrillation (on Eliquis), h/o aortic valve stenosis, h/o CVA, CKD, DM type II, hyperlipidemia, hypertension, COPD and tobacco use who was admitted due to acute CHF exacerbation and AKI.  SUBJECTIVE: The patient reports to be doing "okay" on today and he denies having any chest pain at this time. He states that he continues to have dyspnea that remains unchanged since his admission.   02/22/2021: The patient reports to be doing " so, so" on today and states that he is having a hard time breathing with the CPAP machine.  He states that it "feels as if I am not getting enough air" on the CPAP machine. The patient's oxygen saturation is at 98%, respirations are 16 and he has a slightly increased effort of breathing at this time.  The patient denies having any chest pain, palpitations, peripheral edema or dizziness at this time. However, the patient does report tenderness to the BLE upon palpation.   OBJECTIVE: The patient is sitting comfortably in bed eating lunch. He is now on supplemental oxygen via nasal cannula with oxygen saturations that are within normal range.  The patient continues to be in atrial fibrillation with a slow ventricular response in the high 40s.  The patient continues to remain asymptomatic.  The patient continues to be on a furosemide drip due to hypervolemia. Upon examination he appears to having +1 pitting edema in the BLE. The patient appears acutely ill, his other vital signs are stable and he is in no apparent distress.  02/22/2021: The patient appears chronically ill on today, he continues to be in fibrillation with slow ventricular response and a HR between 38-69 bpm as noted by the bedside telemetry monitor.  The patient continues to remain asymptomatic and all of his other vital signs are stable. The patient's  hemoglobin on today is 7.0 and he will be undergoing a blood transfusion proceeded by IV lasix. The patient appears euvolemic on today and has minimal, nonpitting BLE edema.  Vitals:   02/25/21 0326 02/25/21 0757 02/25/21 1212 02/25/21 1618  BP: (!) 149/59 (!) 110/97 (!) 117/47 (!) 135/47  Pulse: 94 (!) 55 (!) 51 (!) 104  Resp: 18 18 18 18   Temp: 97.6 F (36.4 C) 97.9 F (36.6 C) 98 F (36.7 C) 98.6 F (37 C)  TempSrc:  Oral Oral Oral  SpO2: 100% 94% 100% 100%  Weight: 82.7 kg     Height:         Intake/Output Summary (Last 24 hours) at 02/25/2021 1707 Last data filed at 02/25/2021 1411 Gross per 24 hour  Intake 1280 ml  Output 2750 ml  Net -1470 ml      PHYSICAL EXAM  General: Well developed, well nourished, in no acute distress HEENT:  Normocephalic and atraumatic. PERRL Neck:  No JVD.  Lungs: expiratory wheezes bilaterally to auscultation.  Chest expansion symmetrical, normal effort of breathing, negative for rhonchi, crackles or rales Heart: Irregular heart rate and rhythm. Normal S1 and S2 without gallops or murmurs.  Abdomen: Bowel sounds are positive, abdomen soft and non-tender  Msk:  Normal strength and tone for age. Extremities: Mild, nonpitting BLE edema. Tenderness upon palpitation of the BLE.  No clubbing or cyanosis Neuro: Alert and oriented X 3. Psych:  Good affect, responds appropriately   LABS: Basic Metabolic Panel: Recent Labs    02/23/21 0531  02/23/21 1606 02/24/21 0419 02/25/21 0531  NA 138  --  138  --   K 4.7  --  4.3  --   CL 101  --  102  --   CO2 23  --  25  --   GLUCOSE 133*  --  203*  --   BUN 56*  --  53*  --   CREATININE 2.95*  --  2.53* 2.42*  CALCIUM 8.7*  --  8.4*  --   PHOS  --  4.2  --   --    Liver Function Tests: No results for input(s): AST, ALT, ALKPHOS, BILITOT, PROT, ALBUMIN in the last 72 hours. No results for input(s): LIPASE, AMYLASE in the last 72 hours. CBC: Recent Labs    02/24/21 0419 02/25/21 0531  WBC  7.4 5.7  NEUTROABS 5.4 3.9  HGB 9.0* 8.5*  HCT 29.7* 28.7*  MCV 79.2* 79.7*  PLT 264 254   Cardiac Enzymes: No results for input(s): CKTOTAL, CKMB, CKMBINDEX, TROPONINI in the last 72 hours. BNP: Invalid input(s): POCBNP D-Dimer: No results for input(s): DDIMER in the last 72 hours. Hemoglobin A1C: No results for input(s): HGBA1C in the last 72 hours. Fasting Lipid Panel: No results for input(s): CHOL, HDL, LDLCALC, TRIG, CHOLHDL, LDLDIRECT in the last 72 hours. Thyroid Function Tests: No results for input(s): TSH, T4TOTAL, T3FREE, THYROIDAB in the last 72 hours.  Invalid input(s): FREET3 Anemia Panel: No results for input(s): VITAMINB12, FOLATE, FERRITIN, TIBC, IRON, RETICCTPCT in the last 72 hours.  No results found.   Echo: (11/15/2020)  IMPRESSIONS  1. Left ventricular ejection fraction, by estimation, is 50 to 55%. The  left ventricle has low normal function. The left ventricle has no regional  wall motion abnormalities. Left ventricular diastolic parameters were  normal.  2. Right ventricular systolic function is normal. The right ventricular  size is mildly enlarged.  3. The mitral valve was not well visualized. Mild mitral valve  regurgitation.  4. The aortic valve is calcified. Aortic valve regurgitation is trivial.  5. Aortic dilatation noted.   TELEMETRY: Atrial fibrillation with slow ventricular response  ASSESSMENT AND PLAN:  Principal Problem:   AKI (acute kidney injury) ([place]) Active Problems:   Chronic kidney disease (CKD) stage G3b/A3, moderately decreased glomerular filtration rate (GFR) between 30-44 mL/min/1.73 square meter and albuminuria creatinine ratio greater than 300 mg/g (HCC)   History of CVA (cerebrovascular accident)   Chronic diastolic CHF (congestive heart failure) (HCC)   Atrial fibrillation, chronic (HCC)   Type 2 diabetes mellitus with hypoglycemia without coma (HCC)   Bradycardia   Weakness generalized   Dental infection    Pressure injury of skin    1. Acute on Chronic diastolic HF, reasonably stable, patient appears hypervolemic on today with very mild, pitting BLE edema  -Agree with gentle diuresis with IV lasix gtt.   -Beta-blocker therapy contraindicated at this time due to slow ventricular response.  -Nephrology input appreciated.  -Recommend following a low-sodium diet, performing daily weights and monitoring strict I's and O's.  2. Atrial fibrillation with slow ventricular response, reasonably stable  -We will continue anticoagulation with Eliquis.  -Recommend following bleeding precautions.  -Rate controlled medication not indicated at this time in the presence of slow ventricular response.   -Recommend continuous telemetry monitoring until discharge.  3. CAD s/p CABG  -Recommend continuing statin therapy.  -Recommend following heart healthy diet.  4. AKI, fairly stable with improvement in creatinine  -Nephrology consult appreciated.  5. Severe  anemia s/p blood transfusion of 02/22/21, patient's hemoglobin level today is 8.5  -Agree with current management.  6.  COPD with acute on chronic respiratory failure, fairly stable, patient is having dyspnea at this time  -Agree with current therapy.  -RT evaluation and treatment greatly appreciated.  7. HTN, reasonably controlled, patient normotensive on today  -Recommend continuing to follow a low-sodium diet.  8. HLD  -Recommend continuing statin therapy.     [place], ACNPC-AG  02/25/2021 5:07 PM

## 2021-02-25 NOTE — Progress Notes (Addendum)
[facility]  ROUNDING NOTE   Subjective:   [name] is a 75 y.o.male with past medical history of chronic Atrial Fibrillation, CHF, diabetes, CKD stage 3, GERD and hypertension. He presents to the ED with generalized weakness for 3 days.   We were consulted for evaluate of acute kidney injury. Baseline Creatinine of 1.95, BUN 70, and EGFR 15 on admission.   Patient seen laying in bed Alert and oriented Currently on 4L [place] Says he feels comfortable Tolerating meals, denies nausea Patient seen later requesting CPAP, anxious   Objective:  Vital signs in last 24 hours:  Temp:  [97.6 F (36.4 C)-98 F (36.7 C)] 98 F (36.7 C) (04/25 1212) Pulse Rate:  [50-94] 51 (04/25 1212) Resp:  [17-19] 18 (04/25 1212) BP: (110-149)/(46-97) 117/47 (04/25 1212) SpO2:  [92 %-100 %] 100 % (04/25 1212) Weight:  [82.7 kg] 82.7 kg (04/25 0326)  Weight change: 0.544 kg Filed Weights   02/23/21 0444 02/24/21 0431 02/25/21 0326  Weight: 85.5 kg 82.1 kg 82.7 kg    Intake/Output: I/O last 3 completed shifts: In: 2014 [P.O.:1800; I.V.:114; IV Piggyback:100] Out: 9211 [Urine:3425]   Intake/Output this shift:  Total I/O In: 680 [P.O.:480; IV Piggyback:200] Out: 850 [Urine:850]  Physical Exam: General: NAD, anxious  Head: Normocephalic, atraumatic. Moist oral mucosal membranes  Eyes: Anicteric  Lungs:  Diminished in bases,Left crackles requesting CPAP  Heart: Regular rate and rhythm  Abdomen:  Soft, nontender,   Extremities:  trace peripheral edema.  Neurologic: Nonfocal, moving all four extremities  Skin: No lesions       Basic Metabolic Panel: Recent Labs  Lab 02/20/21 0755 02/21/21 0527 02/22/21 0447 02/23/21 0531 02/23/21 1606 02/24/21 0419 02/25/21 0531  NA 133* 133* 136 138  --  138  --   K 5.8* 5.8* 5.1 4.7  --  4.3  --   CL 100 102 104 101  --  102  --   CO2 23 22 21* 23  --  25  --   GLUCOSE 74 163* 105* 133*  --  203*  --   BUN 68* 65* 59* 56*  --  53*   --   CREATININE 3.65* 3.33* 3.00* 2.95*  --  2.53* 2.42*  CALCIUM 7.9* 8.0* 8.0* 8.7*  --  8.4*  --   PHOS  --   --   --   --  4.2  --   --     Liver Function Tests: Recent Labs  Lab 02/19/21 0849  AST 15  ALT 12  ALKPHOS 102  BILITOT 0.7  PROT 5.6*  ALBUMIN 2.8*   No results for input(s): LIPASE, AMYLASE in the last 168 hours. No results for input(s): AMMONIA in the last 168 hours.  CBC: Recent Labs  Lab 02/21/21 0527 02/22/21 0447 02/22/21 1620 02/23/21 0531 02/24/21 0419 02/25/21 0531  WBC 8.7 6.7  --  7.0 7.4 5.7  NEUTROABS 7.0 5.2  --  5.0 5.4 3.9  HGB 7.6* 7.0* 8.3* 9.1* 9.0* 8.5*  HCT 25.7* 24.7*  --  29.3* 29.7* 28.7*  MCV 78.6* 81.8  --  77.5* 79.2* 79.7*  PLT 275 259  --  261 264 254    Cardiac Enzymes: Recent Labs  Lab 02/19/21 1056  CKTOTAL 34*    BNP: Invalid input(s): POCBNP  CBG: Recent Labs  Lab 02/24/21 1128 02/24/21 1626 02/24/21 2025 02/25/21 0803 02/25/21 1213  GLUCAP 142* 181* 205* 151* 199*    Microbiology: Results for orders placed or  performed during the hospital encounter of 02/19/21  Resp Panel by RT-PCR (Flu A&B, Covid) Nasopharyngeal Swab     Status: None   Collection Time: 02/19/21  9:56 AM   Specimen: Nasopharyngeal Swab; Nasopharyngeal(NP) swabs in vial transport medium  Result Value Ref Range Status   SARS Coronavirus 2 by RT PCR NEGATIVE NEGATIVE Final    Comment: (NOTE) SARS-CoV-2 target nucleic acids are NOT DETECTED.  The SARS-CoV-2 RNA is generally detectable in upper respiratory specimens during the acute phase of infection. The lowest concentration of SARS-CoV-2 viral copies this assay can detect is 138 copies/mL. A negative result does not preclude SARS-Cov-2 infection and should not be used as the sole basis for treatment or other patient management decisions. A negative result may occur with  improper specimen collection/handling, submission of specimen other than nasopharyngeal swab, presence of  viral mutation(s) within the areas targeted by this assay, and inadequate number of viral copies(<138 copies/mL). A negative result must be combined with clinical observations, patient history, and epidemiological information. The expected result is Negative.  Fact Sheet for Patients:  EntrepreneurPulse.com.au  Fact Sheet for Healthcare Providers:  IncredibleEmployment.be  This test is no t yet approved or cleared by the [place] FDA and  has been authorized for detection and/or diagnosis of SARS-CoV-2 by FDA under an Emergency Use Authorization (EUA). This EUA will remain  in effect (meaning this test can be used) for the duration of the COVID-19 declaration under Section 564(b)(1) of the Act, 21 U.S.C.section 360bbb-3(b)(1), unless the authorization is terminated  or revoked sooner.       Influenza A by PCR NEGATIVE NEGATIVE Final   Influenza B by PCR NEGATIVE NEGATIVE Final    Comment: (NOTE) The Xpert Xpress SARS-CoV-2/FLU/RSV plus assay is intended as an aid in the diagnosis of influenza from Nasopharyngeal swab specimens and should not be used as a sole basis for treatment. Nasal washings and aspirates are unacceptable for Xpert Xpress SARS-CoV-2/FLU/RSV testing.  Fact Sheet for Patients: EntrepreneurPulse.com.au  Fact Sheet for Healthcare Providers: IncredibleEmployment.be  This test is not yet approved or cleared by the [place] FDA and has been authorized for detection and/or diagnosis of SARS-CoV-2 by FDA under an Emergency Use Authorization (EUA). This EUA will remain in effect (meaning this test can be used) for the duration of the COVID-19 declaration under Section 564(b)(1) of the Act, 21 U.S.C. section 360bbb-3(b)(1), unless the authorization is terminated or revoked.  Performed at [facility], [address] [place], [place] [address]     Coagulation  Studies: No results for input(s): LABPROT, INR in the last 72 hours.  Urinalysis: No results for input(s): COLORURINE, LABSPEC, PHURINE, GLUCOSEU, HGBUR, BILIRUBINUR, KETONESUR, PROTEINUR, UROBILINOGEN, NITRITE, LEUKOCYTESUR in the last 72 hours.  Invalid input(s): APPERANCEUR    Imaging: No results found.   Medications:   . sodium chloride 250 mL (02/24/21 1235)  . ampicillin-sulbactam (UNASYN) IV 3 g (02/25/21 1224)  . furosemide (LASIX) 200 mg in dextrose 5% 100 mL (43m/mL) infusion 4 mg/hr (02/24/21 1024)   . sodium chloride   Intravenous Once  . acidophilus  1 capsule Oral Daily  . apixaban  5 mg Oral BID  . cholecalciferol  1,000 Units Oral Daily  . gabapentin  100 mg Oral BID  . hydrocortisone  25 mg Rectal BID  . insulin aspart  0-15 Units Subcutaneous TID WC  . iron polysaccharides  150 mg Oral Daily  . magnesium oxide  400 mg Oral Daily  . multivitamin-lutein  1 capsule Oral Daily  . nicotine  21 mg Transdermal Daily  . pantoprazole  40 mg Oral Daily  . Ensure Max Protein  11 oz Oral BID BM  . simvastatin  20 mg Oral q AM  . tiotropium  18 mcg Inhalation Daily  . traZODone  50 mg Oral QHS   sodium chloride, acetaminophen, albuterol, ipratropium-albuterol, melatonin, ondansetron **OR** ondansetron (ZOFRAN) IV, oxyCODONE    Renal U/s  FINDINGS: Right Kidney:   Renal measurements: 9.8 x 4.5 x 4.0 cm = volume: 91.2 mL. Mild increased echogenicity. No mass or hydronephrosis visualized.   Left Kidney:   Renal measurements: 9.4 x 5.3 x 5.1 cm = volume: 132.8 mL. Mild increased echogenicity. No mass or hydronephrosis visualized.   Bladder:   Mild bladder distention cannot be excluded.   Other:   Small right pleural effusion cannot be excluded.   IMPRESSION: 1. Mild increased echogenicity both kidneys suggesting chronic medical renal disease. No acute renal abnormality identified.   2.  Bladder distention cannot be excluded.   3.  Small right pleural  effusion cannot be excluded.     Assessment/ Plan:  [name] is a 75 y.o.  male with past medical history of chronic Atrial Fibrillation, CHF, diabetes, CKD stage 3, GERD and hypertension. He presents to the ED with generalized weakness for 3 days.   1) AKI secondary to  ATN with  CKD risk factors include diabetes, hypertension, age, atherosclerosis.  Increased echogenicity noted in both kidneys. Lab Results  Component Value Date   CREATININE 2.42 (H) 02/25/2021   CREATININE 2.53 (H) 02/24/2021   CREATININE 2.95 (H) 02/23/2021    Renal function improving Current level 2.42 Urine output 2.7L    2) volume overload Continue low dose lasix drip   3)Anemia of chronic disease and iron deficiency anemia  CBC Latest Ref Rng & Units 02/25/2021 02/24/2021 02/23/2021  WBC 4.0 - 10.5 K/uL 5.7 7.4 7.0  Hemoglobin 13.0 - 17.0 g/dL 8.5(L) 9.0(L) 9.1(L)  Hematocrit 39.0 - 52.0 % 28.7(L) 29.7(L) 29.3(L)  Platelets 150 - 400 K/uL 254 264 261    Results for [name], [name] (MRN [id number]) as of 02/23/2021 15:43  Ref. Range 01/28/2021 05:43  Iron Latest Ref Range: 45 - 182 ug/dL 25 (L)  UIBC Latest Units: ug/dL 431  TIBC Latest Ref Range: 250 - 450 ug/dL 456 (H)  Saturation Ratios Latest Ref Range: 17.9 - 39.5 % 6 (L)   Patient did receive PRBC during this admission Will continue to monitor       LOS: 6 Shantelle Breeze 4/25/20222:29 PM [facility]  Patient was seen and evaluated with Colon Flattery, NP.  Plan of care was discussed with patient as well as NP.  I agree with the note as documented with edits.

## 2021-02-25 NOTE — Progress Notes (Addendum)
PT Cancellation Note  Patient Details Name: Wiatt Mahabir MRN: [id number] DOB: 1946/06/07   Cancelled Treatment:    Reason Eval/Treat Not Completed: Medical issues which prohibited therapy   Offered session this am.  He remains on CPAP.  Pt c/o continued SOB and general difficulty breathing at rest despite respiratory support.  HR and O2 with in limits.  Stated breathing right now was challenging enough for him.  Pt has tolerated limited therapy interventions to date with respiratory status being primary limiting factor.  SNF remains appropriate.  Notes reviewed and noted that wife is hesitant for SNF,  She will have a difficult time caring for him without outside assist.  Pt is bed bound at this time.  If discharge home is wanted by pt/family he will need hospital bed and EMS transport home with caregivers.  Lift, RW, 3-in 1 commode and wheelchair would be appropriate if/when he progresses to out of bed mobility as respiratory status allows.   Chesley Noon 02/25/2021, 11:47 AM

## 2021-02-25 NOTE — NC FL2 (Signed)
[place] LEVEL OF CARE SCREENING TOOL     IDENTIFICATION  Patient Name: [name] Birthdate: 12/20/1945 Sex: male Admission Date (Current Location): 02/19/2021  [place] and [place] Number:  Engineering geologist and Address:  [facility], [address] [place], [place] [address]      Provider Number: [id number]  Attending Physician Name and Address:  Sidney Ace, MD  Relative Name and Phone Number:  Sione Baumgarten wife [phone]    Current Level of Care: Hospital Recommended Level of Care: [place] Prior Approval Number:    Date Approved/Denied:   PASRR Number: [id number]  Discharge Plan: SNF    Current Diagnoses: Patient Active Problem List   Diagnosis Date Noted  . Pressure injury of skin 02/20/2021  . AKI (acute kidney injury) ([place]) 02/19/2021  . Type 2 diabetes mellitus with hypoglycemia without coma ([place]) 02/19/2021  . Bradycardia 02/19/2021  . Weakness generalized 02/19/2021  . Dental infection 02/19/2021  . Acute respiratory failure ([place]) 01/28/2021  . Acute renal failure superimposed on stage 3b chronic kidney disease ([place]) 01/28/2021  . Atrial fibrillation, chronic ([place]) 01/28/2021  . Acute CHF (congestive heart failure) ([place]) 11/15/2020  . Chronic diastolic CHF (congestive heart failure) ([place]) 10/24/2020  . Elevated troponin 10/24/2020  . CKD (chronic kidney disease), stage IV ([place]) 10/24/2020  . Malnutrition of moderate degree 12/27/2019  . Hypertensive emergency 12/26/2019  . Hyperglycemia due to type 2 diabetes mellitus ([place]) 12/26/2019  . Chronic kidney disease (CKD) stage G3b/A3, moderately decreased glomerular filtration rate (GFR) between 30-44 mL/min/1.73 square meter and albuminuria creatinine ratio greater than 300 mg/g (HCC) 12/26/2019  . History of COVID-19 12/26/2019  . History of CVA (cerebrovascular accident) 12/26/2019  . 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 History of second degree heart  block 12/26/2019  . Elevated LFTs 12/26/2019  . Acute systolic CHF (congestive heart failure) ([place]) 10/11/2019  . Malignant neoplasm of prostate ([place]) 11/24/2016  . Bright red rectal bleeding 10/02/2016  . COPD with acute exacerbation ([place]) 01/17/2016  . Chronic systolic heart failure ([place]) 12/12/2015  . Essential hypertension 12/12/2015  . COPD (chronic obstructive pulmonary disease) with chronic bronchitis ([place]) 12/12/2015  . Diabetes ([place]) 12/12/2015  . Tobacco abuse 12/12/2015  . Acute respiratory failure with hypoxia (HCC) 12/03/2015    Orientation RESPIRATION BLADDER Height & Weight     Self,Time  Other (Comment),O2 (C-Pap 4L)   Weight: 82.7 kg Height:  5\' 6"  (167.6 cm)  BEHAVIORAL SYMPTOMS/MOOD NEUROLOGICAL BOWEL NUTRITION STATUS        Diet  AMBULATORY STATUS COMMUNICATION OF NEEDS Skin   Limited Assist Verbally Normal                       Personal Care Assistance Level of Assistance  Bathing,Feeding,Dressing Bathing Assistance: Limited assistance Feeding assistance: Independent Dressing Assistance: Limited assistance     Functional Limitations Info  Sight,Hearing,Speech Sight Info: Adequate (wears glasses) Hearing Info: Impaired (hard of hearing) Speech Info: Adequate    SPECIAL CARE FACTORS FREQUENCY  PT (By licensed PT),OT (By licensed OT)     PT Frequency: 5X WEEK OT Frequency: 5X WEEK            Contractures Contractures Info: Not present    Additional Factors Info  Code Status,Allergies Code Status Info: FULL Allergies Info: Lactose Intolerance           Current Medications (02/25/2021):  This is the current hospital active medication list Current  Facility-Administered Medications  Medication Dose Route Frequency Provider Last Rate Last Admin  . 0.9 %  sodium chloride infusion (Manually program via Guardrails IV Fluids)   Intravenous Once Sreenath, Sudheer B, MD      . 0.9 %  sodium chloride infusion   Intravenous PRN Priscella Mann,  Sudheer B, MD 8 mL/hr at 02/24/21 1235 250 mL at 02/24/21 1235  . acetaminophen (TYLENOL) tablet 650 mg  650 mg Oral Q6H PRN Agbata, Tochukwu, MD   650 mg at 02/24/21 1511  . acidophilus (RISAQUAD) capsule 1 capsule  1 capsule Oral Daily Agbata, Tochukwu, MD   1 capsule at 02/25/21 0836  . albuterol (VENTOLIN HFA) 108 (90 Base) MCG/ACT inhaler 2 puff  2 puff Inhalation Q6H PRN Agbata, Tochukwu, MD   2 puff at 02/22/21 1020  . Ampicillin-Sulbactam (UNASYN) 3 g in sodium chloride 0.9 % 100 mL IVPB  3 g Intravenous Q12H Darnelle Bos, [facility]   Stopped at 02/25/21 0054  . apixaban (ELIQUIS) tablet 5 mg  5 mg Oral BID Agbata, Tochukwu, MD   5 mg at 02/25/21 0837  . cholecalciferol (VITAMIN D3) tablet 1,000 Units  1,000 Units Oral Daily Agbata, Tochukwu, MD   1,000 Units at 02/25/21 0837  . furosemide (LASIX) 200 mg in dextrose 5 % 100 mL (2 mg/mL) infusion  4 mg/hr Intravenous Continuous Colon Flattery, NP 2 mL/hr at 02/24/21 1024 4 mg/hr at 02/24/21 1024  . gabapentin (NEURONTIN) capsule 100 mg  100 mg Oral BID Agbata, Tochukwu, MD   100 mg at 02/25/21 0837  . hydrocortisone (ANUSOL-HC) suppository 25 mg  25 mg Rectal BID Ralene Muskrat B, MD   25 mg at 02/24/21 0905  . insulin aspart (novoLOG) injection 0-15 Units  0-15 Units Subcutaneous TID WC Agbata, Tochukwu, MD   3 Units at 02/25/21 0836  . ipratropium-albuterol (DUONEB) 0.5-2.5 (3) MG/3ML nebulizer solution 3 mL  3 mL Nebulization Q4H PRN Sharion Settler, NP   3 mL at 02/21/21 0811  . iron polysaccharides (NIFEREX) capsule 150 mg  150 mg Oral Daily Agbata, Tochukwu, MD   150 mg at 02/25/21 0838  . magnesium oxide (MAG-OX) tablet 400 mg  400 mg Oral Daily Agbata, Tochukwu, MD   400 mg at 02/25/21 0837  . melatonin tablet 5 mg  5 mg Oral QHS PRN Ralene Muskrat B, MD   5 mg at 02/24/21 2133  . multivitamin-lutein (OCUVITE-LUTEIN) capsule 1 capsule  1 capsule Oral Daily Agbata, Tochukwu, MD   1 capsule at 02/25/21 0838  . nicotine (NICODERM  CQ - dosed in mg/24 hours) patch 21 mg  21 mg Transdermal Daily Agbata, Tochukwu, MD   21 mg at 02/25/21 0837  . ondansetron (ZOFRAN) tablet 4 mg  4 mg Oral Q6H PRN Agbata, Tochukwu, MD       Or  . ondansetron (ZOFRAN) injection 4 mg  4 mg Intravenous Q6H PRN Agbata, Tochukwu, MD      . oxyCODONE (Oxy IR/ROXICODONE) immediate release tablet 5 mg  5 mg Oral Q6H PRN Ralene Muskrat B, MD   5 mg at 02/24/21 2132  . pantoprazole (PROTONIX) EC tablet 40 mg  40 mg Oral Daily Agbata, Tochukwu, MD   40 mg at 02/25/21 0837  . protein supplement (ENSURE MAX) liquid  11 oz Oral BID BM Agbata, Tochukwu, MD      . simvastatin (ZOCOR) tablet 20 mg  20 mg Oral q AM Agbata, Tochukwu, MD   20 mg at 02/25/21 0845  . tiotropium (  SPIRIVA) inhalation capsule ([facility] use ONLY) 18 mcg  18 mcg Inhalation Daily Agbata, Tochukwu, MD   18 mcg at 02/25/21 0838  . traZODone (DESYREL) tablet 50 mg  50 mg Oral QHS Agbata, Tochukwu, MD   50 mg at 02/24/21 2132     Discharge Medications: Please see discharge summary for a list of discharge medications.  Relevant Imaging Results:  Relevant Lab Results:   Additional Information ss# [id number]  Kerin Salen, RN

## 2021-02-26 ENCOUNTER — Inpatient Hospital Stay: Payer: PPO

## 2021-02-26 DIAGNOSIS — N179 Acute kidney failure, unspecified: Secondary | ICD-10-CM | POA: Diagnosis not present

## 2021-02-26 LAB — BASIC METABOLIC PANEL
Anion gap: 11 (ref 5–15)
BUN: 45 mg/dL — ABNORMAL HIGH (ref 8–23)
CO2: 27 mmol/L (ref 22–32)
Calcium: 8.2 mg/dL — ABNORMAL LOW (ref 8.9–10.3)
Chloride: 98 mmol/L (ref 98–111)
Creatinine, Ser: 2.41 mg/dL — ABNORMAL HIGH (ref 0.61–1.24)
GFR, Estimated: 27 mL/min — ABNORMAL LOW (ref >60)
Glucose, Bld: 219 mg/dL — ABNORMAL HIGH (ref 70–99)
Potassium: 4.3 mmol/L (ref 3.5–5.1)
Sodium: 136 mmol/L (ref 135–145)

## 2021-02-26 LAB — CBC WITH DIFFERENTIAL/PLATELET
Abs Immature Granulocytes: 0.02 10*3/uL (ref 0.00–0.07)
Basophils Absolute: 0 10*3/uL (ref 0.0–0.1)
Basophils Relative: 0 %
Eosinophils Absolute: 0.2 10*3/uL (ref 0.0–0.5)
Eosinophils Relative: 3 %
HCT: 27 % — ABNORMAL LOW (ref 39.0–52.0)
Hemoglobin: 8.3 g/dL — ABNORMAL LOW (ref 13.0–17.0)
Immature Granulocytes: 0 %
Lymphocytes Relative: 15 %
Lymphs Abs: 1 10*3/uL (ref 0.7–4.0)
MCH: 24.3 pg — ABNORMAL LOW (ref 26.0–34.0)
MCHC: 30.7 g/dL (ref 30.0–36.0)
MCV: 79.2 fL — ABNORMAL LOW (ref 80.0–100.0)
Monocytes Absolute: 0.5 10*3/uL (ref 0.1–1.0)
Monocytes Relative: 8 %
Neutro Abs: 4.7 10*3/uL (ref 1.7–7.7)
Neutrophils Relative %: 74 %
Platelets: 236 10*3/uL (ref 150–400)
RBC: 3.41 MIL/uL — ABNORMAL LOW (ref 4.22–5.81)
RDW: 19.9 % — ABNORMAL HIGH (ref 11.5–15.5)
WBC: 6.3 10*3/uL (ref 4.0–10.5)
nRBC: 0 % (ref 0.0–0.2)

## 2021-02-26 LAB — GLUCOSE, CAPILLARY
Glucose-Capillary: 157 mg/dL — ABNORMAL HIGH (ref 70–99)
Glucose-Capillary: 279 mg/dL — ABNORMAL HIGH (ref 70–99)
Glucose-Capillary: 151 mg/dL — ABNORMAL HIGH (ref 70–99)
Glucose-Capillary: 139 mg/dL — ABNORMAL HIGH (ref 70–99)

## 2021-02-26 MED ORDER — METOLAZONE 2.5 MG PO TABS
2.5000 mg | ORAL_TABLET | Freq: Every day | ORAL | Status: DC
  Administered 2021-02-26 – 2021-03-02 (×5): 2.5 mg via ORAL
  Filled 2021-02-26 (×5): qty 1

## 2021-02-26 NOTE — Progress Notes (Signed)
PT Cancellation Note  Patient Details Name: [name] MRN: [id number] DOB: 04/14/1946   Cancelled Treatment:    Reason Eval/Treat Not Completed: Other (comment)   Pt eating breakfast on first attempt.  Appears better this am but appears fatigued.  Returned late in am.  Pt back on cpap for respiratory support.  He stated "Hell no" when offered therapy services.  SOB remain.  Pt has had very little therapy interventions since admission due to SOB and fatigue.  He does seem to be challenged with just his basic ADL tasks and meals at this time.  Will schedule with primary PT next session for reconsideration if PT services are appropriate at this time.   Chesley Noon 02/26/2021, 11:48 AM

## 2021-02-26 NOTE — Progress Notes (Signed)
PROGRESS NOTE    [name]  RJJ:[id number] DOB: 1946/08/09 DOA: 02/19/2021 PCP: [name], MD   Brief Narrative:  [name] is a 75 y.o. 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 Patient states that he has had weakness for the last 3 days which has made it difficult for him to ambulate.  This morning he states he was unable to get off the toilet and actually had a fall this morning. He denies feeling dizzy or lightheaded and complains of just generalized weakness. He denies having any chest pain, no shortness of breath, no fever, no chills, no headache, no abdominal pain, no nausea, no vomiting, no diarrhea, no urinary symptoms, no blurred vision, no difficulty swallowing, no headache.  Found to be bradycardic, hypoglycemic, borderline hypotensive.  Found to have significant AKI thought to be due to overdiuresis.  Case discussed with cardiology and nephrology.  Recommendations appreciated.  4/21: This morning patient was complaining of worsening shortness of breath.  Stat chest x-ray demonstrates fluid overload.  Administered 60 mg IV Lasix.  CPAP ordered.  4/22: Patient still endorsing shortness of breath although improved from prior.  Tolerated BiPAP okay.  Does better with it on. 4/23: On Lasix infusion.  Continues to require CPAP. 4/24: Slowly improving.  Alternate between 4 L nasal cannula and CPAP. remains on Lasix drip 4/26: Dyspnea and fatigue persistent.  Patient had -5.7 L however chest x-ray today continues to demonstrate vascular congestion.  Patient alternating between 4 L nasal cannula and CPAP.  Case discussed with nephrology, Lasix GTT increased to 6 mg/h and added metolazone 2.5 mg  daily    Assessment & Plan:   Principal Problem:   AKI (acute kidney injury) ([place]) Active Problems:   Chronic kidney disease (CKD) stage G3b/A3, moderately decreased glomerular filtration rate (GFR) between 30-44 mL/min/1.73 square meter and albuminuria creatinine ratio greater than 300 mg/g (HCC)   History of CVA (cerebrovascular accident)   Chronic diastolic CHF (congestive heart failure) (HCC)   Atrial fibrillation, chronic (HCC)   Type 2 diabetes mellitus with hypoglycemia without coma (HCC)   Bradycardia   Weakness generalized   Dental infection   Pressure injury of skin  Acute kidney injury on chronic kidney disease stage IIIb Hyperkalemia Hyponatremia Developed edema on chest x-ray At baseline patient has a serum creatinine of 1.95, on admission his creatinine is 3.95 [place] [facility] consulted, have been following for course of admission Plan: No IV fluids Increase Lasix GTT to 6 mg/h Add metolazone 2.5 mg daily Careful monitoring of volume status Daily BMP  Acute on chronic anemia Hb drifting down to 7 on 4/22 Patient with cardiac history Ideal Hb goal 8 Plan: Continue Eliquis.  No further transfusions at this time.  Daily CBC  Atrial fibrillation with slow ventricular response Chronic issue per Dr. Ubaldo Glassing who the patient is well-known to Plan: Avoid AV nodal blocking agents Continue apixaban for anticoagulation, may need dose reduction if kidney function does not recover  Diabetes mellitus with complications of stage III chronic kidney disease and hypoglycemia Hold insulin Blood sugar checks before meals and at bedtime Encourage oral intake   COPD with acute on chronic respiratory failure Not acutely exacerbated Continue Spiriva, as needed bronchodilator therapy and inhaled steroids Wean oxygen  as tolerated   Acute on chronic diastolic congestive heart failure Diuresis Lasix GTT with supplemental albumin Holding home  diuretics   Coronary artery disease Stable and not acutely exacerbated Continue statins and aspirin Hold nitrates    Generalized weakness/fall  Multifactorial and related to bradycardia as well as hypotension Place patient on fall precautions Therapy evals when able    Dental infection Patient noted to have purulence around his gum line Cervical spine CT shows an expansile lucent lesion with pathologic fracture  identified involving the midline the maxilla. Recommend further evaluation with dedicated maxillofacial CT with contrast. ENT was consulted in the ER and he recommends to obtain maxillofacial CT with contrast once renal function improves Plan: Continue Unasyn for now, last dose today of 7-day course Pursue contrast-enhanced CT maxillofacial once creatinine will allow   DVT prophylaxis: Eliquis Code Status: Full Family Communication: Wife [name] [phone] on 4/25 Disposition Plan: Status is: Inpatient  Remains inpatient appropriate because:Inpatient level of care appropriate due to severity of illness   Dispo: The patient is from: Home              Anticipated d/c is to: Home              Patient currently is not medically stable to d/c.   Difficult to place patient No AKI on CKD.  Fluid overloaded hypoxic respiratory failure.  Remains dependent on Lasix GTT.  Adding metolazone.  Several days prior to disposition.     Level of care: Progressive Cardiac  Consultants:  Nephrology Cardiology   Procedures:None  Antimicrobials:   Unasyn   Subjective: Patient seen and examined.  Continues to be fatigued.  States he feels okay for short periods of time but quickly becomes short of breath and requires CPAP.  Objective: Vitals:   02/26/21 0142 02/26/21 0432 02/26/21 0804 02/26/21 1131  BP:  (!) 152/56 (!) 147/64 (!) 132/58  Pulse: 93 (!) 47 (!) 53 (!) 57  Resp:  20 18 20   Temp:   98.4 F (36.9 C) 97.7 F (36.5 C)  TempSrc:   Oral    SpO2: 93% 100% 98% 96%  Weight:      Height:        Intake/Output Summary (Last 24 hours) at 02/26/2021 1247 Last data filed at 02/26/2021 0950 Gross per 24 hour  Intake 720 ml  Output 1300 ml  Net -580 ml   Filed Weights   02/23/21 0444 02/24/21 0431 02/25/21 0326  Weight: 85.5 kg 82.1 kg 82.7 kg    Examination:  General exam: No acute distress Respiratory system: Scattered crackles bilaterally.  Normal work of breathing.  4 L  cardiovascular system: S1-S2, regular rate and rhythm, no murmurs, 1+ pedal edema  gastrointestinal system: Abdomen is nondistended, soft and nontender. No organomegaly or masses felt. Normal bowel sounds heard. Central nervous system: Alert, oriented x2, no focal deficits, lethargic extremities: Symmetric 5 x 5 power. Skin: No rashes, lesions or ulcers Psychiatry: Judgement and insight appear impaired. Mood & affect flattened.     Data Reviewed: I have personally reviewed following labs and imaging studies  CBC: Recent Labs  Lab 02/22/21 0447 02/22/21 1620 02/23/21 0531 02/24/21 0419 02/25/21 0531 02/26/21 0003  WBC 6.7  --  7.0 7.4 5.7 6.3  NEUTROABS 5.2  --  5.0 5.4 3.9 4.7  HGB 7.0* 8.3* 9.1* 9.0* 8.5* 8.3*  HCT 24.7*  --  29.3* 29.7* 28.7* 27.0*  MCV 81.8  --  77.5* 79.2* 79.7* 79.2*  PLT 259  --  261 264 254 510   Basic Metabolic Panel: Recent Labs  Lab 02/21/21 0527 02/22/21 0447 02/23/21 0531 02/23/21 1606 02/24/21 0419 02/25/21 0531 02/26/21 0003  NA 133* 136 138  --  138  --  136  K 5.8* 5.1 4.7  --  4.3  --  4.3  CL 102 104 101  --  102  --  98  CO2 22 21* 23  --  25  --  27  GLUCOSE 163* 105* 133*  --  203*  --  219*  BUN 65* 59* 56*  --  53*  --  45*  CREATININE 3.33* 3.00* 2.95*  --  2.53* 2.42* 2.41*  CALCIUM 8.0* 8.0* 8.7*  --  8.4*  --  8.2*  PHOS  --   --   --  4.2  --   --   --    GFR: Estimated Creatinine Clearance: 26.7 mL/min (A) (by C-G formula based on SCr of 2.41 mg/dL (H)). Liver Function  Tests: No results for input(s): AST, ALT, ALKPHOS, BILITOT, PROT, ALBUMIN in the last 168 hours. No results for input(s): LIPASE, AMYLASE in the last 168 hours. No results for input(s): AMMONIA in the last 168 hours. Coagulation Profile: No results for input(s): INR, PROTIME in the last 168 hours. Cardiac Enzymes: No results for input(s): CKTOTAL, CKMB, CKMBINDEX, TROPONINI in the last 168 hours. BNP (last 3 results) No results for input(s): PROBNP in the last 8760 hours. HbA1C: No results for input(s): HGBA1C in the last 72 hours. CBG: Recent Labs  Lab 02/25/21 1213 02/25/21 1619 02/25/21 2121 02/26/21 0803 02/26/21 1132  GLUCAP 199* 171* 151* 157* 279*   Lipid Profile: No results for input(s): CHOL, HDL, LDLCALC, TRIG, CHOLHDL, LDLDIRECT in the last 72 hours. Thyroid Function Tests: No results for input(s): TSH, T4TOTAL, FREET4, T3FREE, THYROIDAB in the last 72 hours. Anemia Panel: No results for input(s): VITAMINB12, FOLATE, FERRITIN, TIBC, IRON, RETICCTPCT in the last 72 hours. Sepsis Labs: No results for input(s): PROCALCITON, LATICACIDVEN in the last 168 hours.  Recent Results (from the past 240 hour(s))  Resp Panel by RT-PCR (Flu A&B, Covid) Nasopharyngeal Swab     Status: None   Collection Time: 02/19/21  9:56 AM   Specimen: Nasopharyngeal Swab; Nasopharyngeal(NP) swabs in vial transport medium  Result Value Ref Range Status   SARS Coronavirus 2 by RT PCR NEGATIVE NEGATIVE Final    Comment: (NOTE) SARS-CoV-2 target nucleic acids are NOT DETECTED.  The SARS-CoV-2 RNA is generally detectable in upper respiratory specimens during the acute phase of infection. The lowest concentration of SARS-CoV-2 viral copies this assay can detect is 138 copies/mL. A negative result does not preclude SARS-Cov-2 infection and should not be used as the sole basis for treatment or other patient management decisions. A negative result may occur with  improper specimen  collection/handling, submission of specimen other than nasopharyngeal swab, presence of viral mutation(s) within the areas targeted by this assay, and inadequate number of viral copies(<138 copies/mL). A negative result must be combined with clinical observations, patient history, and epidemiological information. The expected result is Negative.  Fact Sheet for Patients:  EntrepreneurPulse.com.au  Fact Sheet for Healthcare Providers:  IncredibleEmployment.be  This test is no t yet approved or cleared by the [place] FDA and  has been authorized for detection and/or diagnosis of SARS-CoV-2 by FDA under an Emergency Use Authorization (EUA). This EUA will remain  in effect (meaning this test can be used) for  the duration of the COVID-19 declaration under Section 564(b)(1) of the Act, 21 U.S.C.section 360bbb-3(b)(1), unless the authorization is terminated  or revoked sooner.       Influenza A by PCR NEGATIVE NEGATIVE Final   Influenza B by PCR NEGATIVE NEGATIVE Final    Comment: (NOTE) The Xpert Xpress SARS-CoV-2/FLU/RSV plus assay is intended as an aid in the diagnosis of influenza from Nasopharyngeal swab specimens and should not be used as a sole basis for treatment. Nasal washings and aspirates are unacceptable for Xpert Xpress SARS-CoV-2/FLU/RSV testing.  Fact Sheet for Patients: EntrepreneurPulse.com.au  Fact Sheet for Healthcare Providers: IncredibleEmployment.be  This test is not yet approved or cleared by the [place] FDA and has been authorized for detection and/or diagnosis of SARS-CoV-2 by FDA under an Emergency Use Authorization (EUA). This EUA will remain in effect (meaning this test can be used) for the duration of the COVID-19 declaration under Section 564(b)(1) of the Act, 21 U.S.C. section 360bbb-3(b)(1), unless the authorization is terminated or revoked.  Performed at [facility], [address], [place],  [address]          Radiology Studies: [facility] Chest [place] 1 View  Result Date: 02/26/2021 CLINICAL DATA:  Hypoxia. EXAM: PORTABLE CHEST 1 VIEW COMPARISON:  02/22/2021.  CT 02/02/2021. FINDINGS: Prior CABG. Cardiomegaly. Diffuse bilateral interstitial prominence consistent with interstitial edema and or pneumonitis. Small left pleural effusion again noted. No pneumothorax. Chest is unchanged from prior exam. IMPRESSION: Prior CABG. Cardiomegaly. Diffuse bilateral interstitial prominence consistent interstitial edema and or pneumonitis again noted without interim change. Small left pleural effusion again noted without interim change. Electronically Signed   By: Marcello Moores  Register   On: 02/26/2021 07:50        Scheduled Meds: . sodium chloride   Intravenous Once  . acidophilus  1 capsule Oral Daily  . apixaban  5 mg Oral BID  . cholecalciferol  1,000 Units Oral Daily  . gabapentin  100 mg Oral BID  . hydrocortisone  25 mg Rectal BID  . insulin aspart  0-15 Units Subcutaneous TID WC  . iron polysaccharides  150 mg Oral Daily  . magnesium oxide  400 mg Oral Daily  . metolazone  2.5 mg Oral Daily  . multivitamin-lutein  1 capsule Oral Daily  . nicotine  21 mg Transdermal Daily  . pantoprazole  40 mg Oral Daily  . Ensure Max Protein  11 oz Oral BID BM  . simvastatin  20 mg Oral q AM  . tiotropium  18 mcg Inhalation Daily  . traZODone  50 mg Oral QHS   Continuous Infusions: . sodium chloride 250 mL (02/24/21 1235)  . ampicillin-sulbactam (UNASYN) IV 3 g (02/26/21 1243)  . furosemide (LASIX) 200 mg in dextrose 5% 100 mL (2mg /mL) infusion 6 mg/hr (02/26/21 1118)     LOS: 7 days    Time spent: 25 minutes    Sidney Ace, MD [facility] Pager [phone]  If 7PM-7AM, please contact night-coverage 02/26/2021, 12:47 PM

## 2021-02-26 NOTE — Progress Notes (Signed)
[facility]  ROUNDING NOTE   Subjective:   [name] is a 75 y.o.male with past medical history of chronic Atrial Fibrillation, CHF, diabetes, CKD stage 3, GERD and hypertension. He presents to the ED with generalized weakness for 3 days.   We were consulted for evaluate of acute kidney injury. Baseline Creatinine of 1.95, BUN 70, and EGFR 15 on admission.   Patient seen laying in bed Alert and oriented Currently on 4L [place], but requesting CPAP Says he feels fine for short periods, but eating breakfast made him short of breath Tolerating meals, denies nausea   Objective:  Vital signs in last 24 hours:  Temp:  [98 F (36.7 C)-98.6 F (37 C)] 98.4 F (36.9 C) (04/26 0804) Pulse Rate:  [47-104] 53 (04/26 0804) Resp:  [18-20] 18 (04/26 0804) BP: (117-152)/(47-64) 147/64 (04/26 0804) SpO2:  [93 %-100 %] 98 % (04/26 0804)  Weight change:  Filed Weights   02/23/21 0444 02/24/21 0431 02/25/21 0326  Weight: 85.5 kg 82.1 kg 82.7 kg    Intake/Output: I/O last 3 completed shifts: In: 680 [P.O.:480; IV Piggyback:200] Out: 2550 [Urine:2550]   Intake/Output this shift:  Total I/O In: 480 [P.O.:480] Out: -   Physical Exam: General: NAD, anxious  Head: Normocephalic, atraumatic. Moist oral mucosal membranes  Eyes: Anicteric  Lungs:  Diminished in left base,Left fine crackles requesting CPAP  Heart: Regular rate and rhythm  Abdomen:  Soft, nontender,   Extremities:  no peripheral edema.  Neurologic: Nonfocal, moving all four extremities  Skin: No lesions       Basic Metabolic Panel: Recent Labs  Lab 02/21/21 0527 02/22/21 0447 02/23/21 0531 02/23/21 1606 02/24/21 0419 02/25/21 0531 02/26/21 0003  NA 133* 136 138  --  138  --  136  K 5.8* 5.1 4.7  --  4.3  --  4.3  CL 102 104 101  --  102  --  98  CO2 22 21* 23  --  25  --  27  GLUCOSE 163* 105* 133*  --  203*  --  219*  BUN 65* 59* 56*  --  53*  --  45*  CREATININE 3.33* 3.00* 2.95*  --  2.53* 2.42*  2.41*  CALCIUM 8.0* 8.0* 8.7*  --  8.4*  --  8.2*  PHOS  --   --   --  4.2  --   --   --     Liver Function Tests: No results for input(s): AST, ALT, ALKPHOS, BILITOT, PROT, ALBUMIN in the last 168 hours. No results for input(s): LIPASE, AMYLASE in the last 168 hours. No results for input(s): AMMONIA in the last 168 hours.  CBC: Recent Labs  Lab 02/22/21 0447 02/22/21 1620 02/23/21 0531 02/24/21 0419 02/25/21 0531 02/26/21 0003  WBC 6.7  --  7.0 7.4 5.7 6.3  NEUTROABS 5.2  --  5.0 5.4 3.9 4.7  HGB 7.0* 8.3* 9.1* 9.0* 8.5* 8.3*  HCT 24.7*  --  29.3* 29.7* 28.7* 27.0*  MCV 81.8  --  77.5* 79.2* 79.7* 79.2*  PLT 259  --  261 264 254 236    Cardiac Enzymes: No results for input(s): CKTOTAL, CKMB, CKMBINDEX, TROPONINI in the last 168 hours.  BNP: Invalid input(s): POCBNP  CBG: Recent Labs  Lab 02/25/21 0803 02/25/21 1213 02/25/21 1619 02/25/21 2121 02/26/21 0803  GLUCAP 151* 199* 171* 151* 157*    Microbiology: Results for orders placed or performed during the hospital encounter of 02/19/21  Resp Panel by  RT-PCR (Flu A&B, Covid) Nasopharyngeal Swab     Status: None   Collection Time: 02/19/21  9:56 AM   Specimen: Nasopharyngeal Swab; Nasopharyngeal(NP) swabs in vial transport medium  Result Value Ref Range Status   SARS Coronavirus 2 by RT PCR NEGATIVE NEGATIVE Final    Comment: (NOTE) SARS-CoV-2 target nucleic acids are NOT DETECTED.  The SARS-CoV-2 RNA is generally detectable in upper respiratory specimens during the acute phase of infection. The lowest concentration of SARS-CoV-2 viral copies this assay can detect is 138 copies/mL. A negative result does not preclude SARS-Cov-2 infection and should not be used as the sole basis for treatment or other patient management decisions. A negative result may occur with  improper specimen collection/handling, submission of specimen other than nasopharyngeal swab, presence of viral mutation(s) within the areas  targeted by this assay, and inadequate number of viral copies(<138 copies/mL). A negative result must be combined with clinical observations, patient history, and epidemiological information. The expected result is Negative.  Fact Sheet for Patients:  EntrepreneurPulse.com.au  Fact Sheet for Healthcare Providers:  IncredibleEmployment.be  This test is no t yet approved or cleared by the [place] FDA and  has been authorized for detection and/or diagnosis of SARS-CoV-2 by FDA under an Emergency Use Authorization (EUA). This EUA will remain  in effect (meaning this test can be used) for the duration of the COVID-19 declaration under Section 564(b)(1) of the Act, 21 U.S.C.section 360bbb-3(b)(1), unless the authorization is terminated  or revoked sooner.       Influenza A by PCR NEGATIVE NEGATIVE Final   Influenza B by PCR NEGATIVE NEGATIVE Final    Comment: (NOTE) The Xpert Xpress SARS-CoV-2/FLU/RSV plus assay is intended as an aid in the diagnosis of influenza from Nasopharyngeal swab specimens and should not be used as a sole basis for treatment. Nasal washings and aspirates are unacceptable for Xpert Xpress SARS-CoV-2/FLU/RSV testing.  Fact Sheet for Patients: EntrepreneurPulse.com.au  Fact Sheet for Healthcare Providers: IncredibleEmployment.be  This test is not yet approved or cleared by the [place] FDA and has been authorized for detection and/or diagnosis of SARS-CoV-2 by FDA under an Emergency Use Authorization (EUA). This EUA will remain in effect (meaning this test can be used) for the duration of the COVID-19 declaration under Section 564(b)(1) of the Act, 21 U.S.C. section 360bbb-3(b)(1), unless the authorization is terminated or revoked.  Performed at [facility], [address] [place], [place] [address]     Coagulation Studies: No results for input(s): LABPROT,  INR in the last 72 hours.  Urinalysis: No results for input(s): COLORURINE, LABSPEC, PHURINE, GLUCOSEU, HGBUR, BILIRUBINUR, KETONESUR, PROTEINUR, UROBILINOGEN, NITRITE, LEUKOCYTESUR in the last 72 hours.  Invalid input(s): APPERANCEUR    Imaging: DG Chest Port 1 View  Result Date: 02/26/2021 CLINICAL DATA:  Hypoxia. EXAM: PORTABLE CHEST 1 VIEW COMPARISON:  02/22/2021.  CT 02/02/2021. FINDINGS: Prior CABG. Cardiomegaly. Diffuse bilateral interstitial prominence consistent with interstitial edema and or pneumonitis. Small left pleural effusion again noted. No pneumothorax. Chest is unchanged from prior exam. IMPRESSION: Prior CABG. Cardiomegaly. Diffuse bilateral interstitial prominence consistent interstitial edema and or pneumonitis again noted without interim change. Small left pleural effusion again noted without interim change. Electronically Signed   By: Marcello Moores  Register   On: 02/26/2021 07:50     Medications:   . sodium chloride 250 mL (02/24/21 1235)  . ampicillin-sulbactam (UNASYN) IV 3 g (02/25/21 2301)  . furosemide (LASIX) 200 mg in dextrose 5% 100 mL (44m/mL) infusion 4 mg/hr (  02/24/21 1024)   . sodium chloride   Intravenous Once  . acidophilus  1 capsule Oral Daily  . apixaban  5 mg Oral BID  . cholecalciferol  1,000 Units Oral Daily  . gabapentin  100 mg Oral BID  . hydrocortisone  25 mg Rectal BID  . insulin aspart  0-15 Units Subcutaneous TID WC  . iron polysaccharides  150 mg Oral Daily  . magnesium oxide  400 mg Oral Daily  . metolazone  2.5 mg Oral Daily  . multivitamin-lutein  1 capsule Oral Daily  . nicotine  21 mg Transdermal Daily  . pantoprazole  40 mg Oral Daily  . Ensure Max Protein  11 oz Oral BID BM  . simvastatin  20 mg Oral q AM  . tiotropium  18 mcg Inhalation Daily  . traZODone  50 mg Oral QHS   sodium chloride, acetaminophen, albuterol, ipratropium-albuterol, melatonin, ondansetron **OR** ondansetron (ZOFRAN) IV, oxyCODONE    Renal U/s   FINDINGS: Right Kidney:   Renal measurements: 9.8 x 4.5 x 4.0 cm = volume: 91.2 mL. Mild increased echogenicity. No mass or hydronephrosis visualized.   Left Kidney:   Renal measurements: 9.4 x 5.3 x 5.1 cm = volume: 132.8 mL. Mild increased echogenicity. No mass or hydronephrosis visualized.   Bladder:   Mild bladder distention cannot be excluded.   Other:   Small right pleural effusion cannot be excluded.   IMPRESSION: 1. Mild increased echogenicity both kidneys suggesting chronic medical renal disease. No acute renal abnormality identified.   2.  Bladder distention cannot be excluded.   3.  Small right pleural effusion cannot be excluded.     Assessment/ Plan:  [name] is a 75 y.o.  male with past medical history of chronic Atrial Fibrillation, CHF, diabetes, CKD stage 3, GERD and hypertension. He presents to the ED with generalized weakness for 3 days.   1) AKI secondary to  ATN with  CKD risk factors include diabetes, hypertension, age, atherosclerosis.  Increased echogenicity noted in both kidneys. Lab Results  Component Value Date   CREATININE 2.41 (H) 02/26/2021   CREATININE 2.42 (H) 02/25/2021   CREATININE 2.53 (H) 02/24/2021    Renal function improving Urine output 1.7L   2) volume overload Increase lasix drip to 53m/hr to improve respiratory distress   3)Anemia of chronic disease and iron deficiency anemia  CBC Latest Ref Rng & Units 02/26/2021 02/25/2021 02/24/2021  WBC 4.0 - 10.5 K/uL 6.3 5.7 7.4  Hemoglobin 13.0 - 17.0 g/dL 8.3(L) 8.5(L) 9.0(L)  Hematocrit 39.0 - 52.0 % 27.0(L) 28.7(L) 29.7(L)  Platelets 150 - 400 K/uL 236 254 264    Results for RZIMIR, KITTLESON(MRN [id number] as of 02/23/2021 15:43  Ref. Range 01/28/2021 05:43  Iron Latest Ref Range: 45 - 182 ug/dL 25 (L)  UIBC Latest Units: ug/dL 431  TIBC Latest Ref Range: 250 - 450 ug/dL 456 (H)  Saturation Ratios Latest Ref Range: 17.9 - 39.5 % 6 (L)   Patient did receive PRBC  during this admission Will continue to monitor       LOS: 7   4/[facility]/202211:11 AM [facility]  Patient was seen and evaluated with SColon Flattery NP.  Plan of care was discussed with patient as well as NP.  I agree with the note as documented with edits.

## 2021-02-26 NOTE — Plan of Care (Signed)
  Problem: Education: Goal: Ability to demonstrate management of disease process will improve Outcome: Progressing Goal: Ability to verbalize understanding of medication therapies will improve Outcome: Progressing Goal: Individualized Educational Video(s) Outcome: Progressing   Problem: Education: Goal: Knowledge of General Education information will improve Description: Including pain rating scale, medication(s)/side effects and non-pharmacologic comfort measures Outcome: Progressing   Problem: Health Behavior/Discharge Planning: Goal: Ability to manage health-related needs will improve Outcome: Progressing   Problem: Activity: Goal: Risk for activity intolerance will decrease Outcome: Progressing   Problem: Safety: Goal: Ability to remain free from injury will improve Outcome: Progressing

## 2021-02-26 NOTE — Progress Notes (Signed)
[facility]  Patient Description: [name] is a 75 year old male with PMH significant for CAD s/p CABG (2006), h/o HFrEF (most recent EF = 50-55%), paroxysmal atrial fibrillation (on Eliquis), h/o aortic valve stenosis, h/o CVA, CKD, DM type II, hyperlipidemia, hypertension, COPD and tobacco use who was admitted due to acute CHF exacerbation and AKI.  SUBJECTIVE: The patient reports to be feeling slightly better on today. He reports that his dyspnea remains unchanged and he continues to deny having any chest pain, dizziness or palpitations at this time.   02/25/21: The patient reports to be doing "okay" on today and he denies having any chest pain at this time. He states that he continues to have dyspnea that remains unchanged since his admission.   02/22/2021: The patient reports to be doing " so, so" on today and states that he is having a hard time breathing with the CPAP machine.  He states that it "feels as if I am not getting enough air" on the CPAP machine. The patient's oxygen saturation is at 98%, respirations are 16 and he has a slightly increased effort of breathing at this time.  The patient denies having any chest pain, palpitations, peripheral edema or dizziness at this time. However, the patient does report tenderness to the BLE upon palpation.   OBJECTIVE: The patient appears much better on today as he is more interactive and smiling. He continues to be in atrial fibrilation with slow ventricular response as noted by the bedside telemetry monitoring. The patient continues to have vascular congestion, despite the decrease in his peripheral edema. He continues to be on a IV Lasix drip which has been increased by nephrology to 6 mg/hr and he has also been placed on metalazone 2.5mg  daily. The patient has good urinary output at this time and the indwelling catheter remains in place. The patient appears fair on today, his vss and he is in no apparent distress.  The patient continues to be on  supplemental oxygen via nasal cannula and utilizes CPAP machine at night.   02/25/21: The patient is sitting comfortably in bed eating lunch. He is now on supplemental oxygen via nasal cannula with oxygen saturations that are within normal range.  The patient continues to be in atrial fibrillation with a slow ventricular response in the high 40s.  The patient continues to remain asymptomatic.  The patient continues to be on a furosemide drip due to hypervolemia. Upon examination he appears to having +1 pitting edema in the BLE. The patient appears acutely ill, his other vital signs are stable and he is in no apparent distress.  02/22/2021: The patient appears chronically ill on today, he continues to be in fibrillation with slow ventricular response and a HR between 38-69 bpm as noted by the bedside telemetry monitor.  The patient continues to remain asymptomatic and all of his other vital signs are stable. The patient's hemoglobin on today is 7.0 and he will be undergoing a blood transfusion proceeded by IV lasix. The patient appears euvolemic on today and has minimal, nonpitting BLE edema.  Vitals:   02/26/21 0142 02/26/21 0432 02/26/21 0804 02/26/21 1131  BP:  (!) 152/56 (!) 147/64 (!) 132/58  Pulse: 93 (!) 47 (!) 53 (!) 57  Resp:  20 18 20   Temp:   98.4 F (36.9 C) 97.7 F (36.5 C)  TempSrc:   Oral   SpO2: 93% 100% 98% 96%  Weight:      Height:  Intake/Output Summary (Last 24 hours) at 02/26/2021 1517 Last data filed at 02/26/2021 1345 Gross per 24 hour  Intake 720 ml  Output 900 ml  Net -180 ml      PHYSICAL EXAM  General: Well developed, well nourished, in no acute distress HEENT:  Normocephalic and atraumatic. PERRL Neck:  No JVD.  Lungs: clear bilaterally to auscultation.  Chest expansion symmetrical, normal effort of breathing, negative for rhonchi, crackles or rales Heart: Irregular heart rate and rhythm. Normal S1 and S2 without gallops or murmurs.  Abdomen: Bowel  sounds are positive, abdomen soft and non-tender  Msk:  Normal strength and tone for age. Extremities: Mild, nonpitting BLE edema.  No clubbing or cyanosis Neuro: Alert and oriented X 3. Psych:  Good affect, responds appropriately   LABS: Basic Metabolic Panel: Recent Labs    02/23/21 1606 02/24/21 0419 02/24/21 0419 02/25/21 0531 02/26/21 0003  NA  --  138  --   --  136  K  --  4.3  --   --  4.3  CL  --  102  --   --  98  CO2  --  25  --   --  27  GLUCOSE  --  203*  --   --  219*  BUN  --  53*  --   --  45*  CREATININE  --  2.53*   < > 2.42* 2.41*  CALCIUM  --  8.4*  --   --  8.2*  PHOS 4.2  --   --   --   --    < > = values in this interval not displayed.   Liver Function Tests: No results for input(s): AST, ALT, ALKPHOS, BILITOT, PROT, ALBUMIN in the last 72 hours. No results for input(s): LIPASE, AMYLASE in the last 72 hours. CBC: Recent Labs    02/25/21 0531 02/26/21 0003  WBC 5.7 6.3  NEUTROABS 3.9 4.7  HGB 8.5* 8.3*  HCT 28.7* 27.0*  MCV 79.7* 79.2*  PLT 254 236   Cardiac Enzymes: No results for input(s): CKTOTAL, CKMB, CKMBINDEX, TROPONINI in the last 72 hours. BNP: Invalid input(s): POCBNP D-Dimer: No results for input(s): DDIMER in the last 72 hours. Hemoglobin A1C: No results for input(s): HGBA1C in the last 72 hours. Fasting Lipid Panel: No results for input(s): CHOL, HDL, LDLCALC, TRIG, CHOLHDL, LDLDIRECT in the last 72 hours. Thyroid Function Tests: No results for input(s): TSH, T4TOTAL, T3FREE, THYROIDAB in the last 72 hours.  Invalid input(s): FREET3 Anemia Panel: No results for input(s): VITAMINB12, FOLATE, FERRITIN, TIBC, IRON, RETICCTPCT in the last 72 hours.  DG Chest Port 1 View  Result Date: 02/26/2021 CLINICAL DATA:  Hypoxia. EXAM: PORTABLE CHEST 1 VIEW COMPARISON:  02/22/2021.  CT 02/02/2021. FINDINGS: Prior CABG. Cardiomegaly. Diffuse bilateral interstitial prominence consistent with interstitial edema and or pneumonitis. Small  left pleural effusion again noted. No pneumothorax. Chest is unchanged from prior exam. IMPRESSION: Prior CABG. Cardiomegaly. Diffuse bilateral interstitial prominence consistent interstitial edema and or pneumonitis again noted without interim change. Small left pleural effusion again noted without interim change. Electronically Signed   By: Marcello Moores  Register   On: 02/26/2021 07:50     Echo: (11/15/2020)  IMPRESSIONS  1. Left ventricular ejection fraction, by estimation, is 50 to 55%. The  left ventricle has low normal function. The left ventricle has no regional  wall motion abnormalities. Left ventricular diastolic parameters were  normal.  2. Right ventricular systolic function is normal. The right ventricular  size is mildly enlarged.  3. The mitral valve was not well visualized. Mild mitral valve  regurgitation.  4. The aortic valve is calcified. Aortic valve regurgitation is trivial.  5. Aortic dilatation noted.   TELEMETRY: Atrial fibrillation with slow ventricular response  ASSESSMENT AND PLAN:  Principal Problem:   AKI (acute kidney injury) ([place]) Active Problems:   Chronic kidney disease (CKD) stage G3b/A3, moderately decreased glomerular filtration rate (GFR) between 30-44 mL/min/1.73 square meter and albuminuria creatinine ratio greater than 300 mg/g (HCC)   History of CVA (cerebrovascular accident)   Chronic diastolic CHF (congestive heart failure) (HCC)   Atrial fibrillation, chronic (HCC)   Type 2 diabetes mellitus with hypoglycemia without coma (HCC)   Bradycardia   Weakness generalized   Dental infection   Pressure injury of skin    1. Acute on Chronic diastolic HF, reasonably stable  -Agree with gentle diuresis with IV lasix gtt which has been increased to 6mg /hr by Nephrology.  Nephrology input appreciated.  -Avoid AV nodal blocking agents at this time due to slow ventricular response.  -Recommend following a low-sodium diet, performing daily weights and  monitoring strict I's and O's.  2. Atrial fibrillation with slow ventricular response, reasonably stable  -We will continue anticoagulation with Eliquis.  -Recommend following bleeding precautions.  -Rate control not indicated at this time in the presence of slow ventricular response.   -Recommend continuous telemetry monitoring until discharge.  3. CAD s/p CABG  -Recommend continuing statin therapy.  -Recommend following heart healthy diet.  4. AKI, fairly stable with improvement in creatinine  -Nephrology consult appreciated.  5. Severe anemia s/p blood transfusion of 02/22/21, patient's hemoglobin level today is 8.5>>8.3  -Agree with current management.  6.  COPD with acute on chronic respiratory failure, fairly stable, patient is having dyspnea at this time  -Agree with current therapy with supplemental oxygen via nasal cannula and CPAP use at night.  -RT evaluation and treatment greatly appreciated.  7. HTN, reasonably controlled, patient normotensive on today  -Recommend continuing to follow a low-sodium diet.  8. HLD  -Recommend continuing statin therapy.     [place], ACNPC-AG  02/26/2021 3:17 PM

## 2021-02-27 DIAGNOSIS — N179 Acute kidney failure, unspecified: Secondary | ICD-10-CM | POA: Diagnosis not present

## 2021-02-27 LAB — CBC WITH DIFFERENTIAL/PLATELET
Abs Immature Granulocytes: 0.04 10*3/uL (ref 0.00–0.07)
Basophils Absolute: 0 10*3/uL (ref 0.0–0.1)
Basophils Relative: 1 %
Eosinophils Absolute: 0.2 10*3/uL (ref 0.0–0.5)
Eosinophils Relative: 3 %
HCT: 26.5 % — ABNORMAL LOW (ref 39.0–52.0)
Hemoglobin: 8 g/dL — ABNORMAL LOW (ref 13.0–17.0)
Immature Granulocytes: 1 %
Lymphocytes Relative: 17 %
Lymphs Abs: 1 10*3/uL (ref 0.7–4.0)
MCH: 23.9 pg — ABNORMAL LOW (ref 26.0–34.0)
MCHC: 30.2 g/dL (ref 30.0–36.0)
MCV: 79.1 fL — ABNORMAL LOW (ref 80.0–100.0)
Monocytes Absolute: 0.5 10*3/uL (ref 0.1–1.0)
Monocytes Relative: 9 %
Neutro Abs: 4.1 10*3/uL (ref 1.7–7.7)
Neutrophils Relative %: 69 %
Platelets: 232 10*3/uL (ref 150–400)
RBC: 3.35 MIL/uL — ABNORMAL LOW (ref 4.22–5.81)
RDW: 19.9 % — ABNORMAL HIGH (ref 11.5–15.5)
WBC: 5.9 10*3/uL (ref 4.0–10.5)
nRBC: 0 % (ref 0.0–0.2)

## 2021-02-27 LAB — BASIC METABOLIC PANEL
Anion gap: 10 (ref 5–15)
BUN: 40 mg/dL — ABNORMAL HIGH (ref 8–23)
CO2: 28 mmol/L (ref 22–32)
Calcium: 8.2 mg/dL — ABNORMAL LOW (ref 8.9–10.3)
Chloride: 97 mmol/L — ABNORMAL LOW (ref 98–111)
Creatinine, Ser: 2.21 mg/dL — ABNORMAL HIGH (ref 0.61–1.24)
GFR, Estimated: 30 mL/min — ABNORMAL LOW (ref >60)
Glucose, Bld: 181 mg/dL — ABNORMAL HIGH (ref 70–99)
Potassium: 3.8 mmol/L (ref 3.5–5.1)
Sodium: 135 mmol/L (ref 135–145)

## 2021-02-27 LAB — GLUCOSE, CAPILLARY
Glucose-Capillary: 175 mg/dL — ABNORMAL HIGH (ref 70–99)
Glucose-Capillary: 259 mg/dL — ABNORMAL HIGH (ref 70–99)
Glucose-Capillary: 218 mg/dL — ABNORMAL HIGH (ref 70–99)
Glucose-Capillary: 176 mg/dL — ABNORMAL HIGH (ref 70–99)

## 2021-02-27 MED ORDER — INSULIN ASPART 100 UNIT/ML ~~LOC~~ SOLN
0.0000 [IU] | Freq: Three times a day (TID) | SUBCUTANEOUS | Status: DC
  Administered 2021-02-27: 11 [IU] via SUBCUTANEOUS
  Administered 2021-02-27 – 2021-02-28 (×3): 7 [IU] via SUBCUTANEOUS
  Administered 2021-03-01 (×2): 4 [IU] via SUBCUTANEOUS
  Administered 2021-03-01: 7 [IU] via SUBCUTANEOUS
  Administered 2021-03-02: 3 [IU] via SUBCUTANEOUS
  Filled 2021-02-27 (×7): qty 1

## 2021-02-27 MED ORDER — INSULIN GLARGINE 100 UNIT/ML ~~LOC~~ SOLN
10.0000 [IU] | Freq: Every day | SUBCUTANEOUS | Status: DC
  Administered 2021-02-27 – 2021-02-28 (×2): 10 [IU] via SUBCUTANEOUS
  Filled 2021-02-27 (×3): qty 0.1

## 2021-02-27 MED ORDER — PANTOPRAZOLE SODIUM 40 MG IV SOLR
40.0000 mg | Freq: Two times a day (BID) | INTRAVENOUS | Status: DC
  Administered 2021-02-27 – 2021-03-02 (×6): 40 mg via INTRAVENOUS
  Filled 2021-02-27 (×6): qty 40

## 2021-02-27 MED ORDER — INSULIN ASPART 100 UNIT/ML ~~LOC~~ SOLN: 0.0000 [IU] | Freq: Three times a day (TID) | SUBCUTANEOUS | Status: DC

## 2021-02-27 NOTE — TOC Progression Note (Signed)
Transition of Care [facility]) - Progression Note    Patient Details  Name: Arkin Imran MRN: [id number] Date of Birth: [birth year]  Transition of Care [facility] CM/SW Contact  Eileen Stanford, LCSW Phone Number: 02/27/2021, 2:41 PM  Clinical Narrative:   [facility] will take pt at dc. Will need prior auth.    Expected Discharge Plan: [place] Barriers to Discharge: Continued Medical Work up  Expected Discharge Plan and Services Expected Discharge Plan: [place] In-house Referral: Clinical Social Work     Living arrangements for the past 2 months: Single Family Home                 DME Arranged: N/A DME Agency: NA       HH Arranged: NA [place] Agency: NA         Social Determinants of Health (SDOH) Interventions    Readmission Risk Interventions Readmission Risk Prevention Plan 02/04/2021 10/28/2020 12/27/2019  Transportation Screening Complete Complete Complete  PCP or Specialist Appt within 3-5 Days - - Complete  HRI or [place] - - Complete  Medication Review (RN Care Manager) Complete Complete Complete  PCP or Specialist appointment within 3-5 days of discharge Complete Complete -  [place] or Home Care Consult Complete Complete -  SW Recovery Care/Counseling Consult Complete - -  Palliative Care Screening Not Applicable Not Applicable -  [place] Not Applicable Not Applicable -  Some recent data might be hidden

## 2021-02-27 NOTE — Progress Notes (Signed)
Physical Therapy Treatment Patient Details Name: [name] MRN: [id number] DOB: [birth year] Today's Date: 02/27/2021    History of Present Illness [name] is a 75 y.o. 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  Patient states that he has had weakness for the last 3 days which has made it difficult for him to ambulate.  This morning he states he was unable to get off the toilet and actually had a fall this morning.    PT Comments    Pt initially not interested in doing much with PT, but with minimal encouragement and explanation he did agree that he should try go get up and do some activity. Pt needed help with getting to standing on each attempt.  He was able to ambulate ~8 ft with very slow and labored (but not unsafe) gait.  He was quick to fatigue but O2 remained in the 90s and HR appropriate as well.    Follow Up Recommendations  SNF;Supervision for mobility/OOB     Equipment Recommendations  None recommended by PT    Recommendations for Other Services       Precautions / Restrictions Precautions Precautions: Fall Restrictions Weight Bearing Restrictions: No    Mobility  Bed Mobility Overal bed mobility: Needs Assistance Bed Mobility: Supine to Sit     Supine to sit: Mod assist     General bed mobility comments: Pt able to get LEs partially toward EOB, but ultimately needed assist (with LEs, hips, trunk) to get to upright sitting    Transfers Overall transfer level: Needs assistance Equipment used: Rolling walker (2 wheeled) Transfers: Sit to/from Stand Sit to Stand: Min assist;Mod assist         General transfer comment: multiple sit to stand attempts this session.  Unable to do any of  them w/o at least some assist, min assist when able to get UEs in good position to use UEs he needed only min assist  Ambulation/Gait Ambulation/Gait assistance: Min assist Gait Distance (Feet): 8 Feet Assistive device: Rolling walker (2 wheeled)       General Gait Details: Pt was able to take a few small turning steps to get to the recliner and, due to fatigue needed to sit.  After rest break he was ble to again ambulate (~33ft)with very slow, shuffling, cautious and labored steps.  On 4-6 L t/o ambulation, sats dropped from high to low 90s.   Stairs             Wheelchair Mobility    Modified Rankin (Stroke Patients Only)       Balance Overall balance assessment: Needs assistance Sitting-balance support: Feet supported Sitting balance-Leahy Scale: Fair     Standing balance support: Bilateral upper extremity supported Standing balance-Leahy Scale: Fair Standing balance comment: relient on walker, forward flexed posture, quick to fatigue but able to maintain standing balance w/o direct assist                            Cognition Arousal/Alertness: Awake/alert Behavior During Therapy: WFL for tasks assessed/performed Overall Cognitive Status: Within Functional Limits for tasks assessed  Exercises      General Comments        Pertinent Vitals/Pain Pain Assessment: No/denies pain    Home Living                      Prior Function            PT Goals (current goals can now be found in the care plan section) Progress towards PT goals: Progressing toward goals    Frequency    Min 2X/week      PT Plan Current plan remains appropriate    Co-evaluation              AM-PAC PT "6 Clicks" Mobility   Outcome Measure  Help needed turning from your back to your side while in a flat bed without using bedrails?: A Little Help needed moving from lying on your back to sitting on  the side of a flat bed without using bedrails?: A Lot Help needed moving to and from a bed to a chair (including a wheelchair)?: A Lot Help needed standing up from a chair using your arms (e.g., wheelchair or bedside chair)?: A Lot Help needed to walk in hospital room?: A Lot Help needed climbing 3-5 steps with a railing? : A Lot 6 Click Score: 13    End of Session Equipment Utilized During Treatment: Oxygen Activity Tolerance: Patient limited by fatigue;Patient limited by pain Patient left: with chair alarm set;with call bell/phone within reach Nurse Communication: Mobility status PT Visit Diagnosis: Other abnormalities of gait and mobility (R26.89);Muscle weakness (generalized) (M62.81);History of falling (Z91.81);Pain Pain - Right/Left: Right Pain - part of body: Ankle and joints of foot     Time: 1710-1738 PT Time Calculation (min) (ACUTE ONLY): 28 min  Charges:  $Gait Training: 8-22 mins $Therapeutic Activity: 8-22 mins                     Kreg Shropshire, DPT 02/27/2021, 5:54 PM

## 2021-02-27 NOTE — Progress Notes (Signed)
[facility]  ROUNDING NOTE   Subjective:   [name] is a 75 y.o.male with past medical history of chronic Atrial Fibrillation, CHF, diabetes, CKD stage 3, GERD and hypertension. He presents to the ED with generalized weakness for 3 days.   We were consulted for evaluate of acute kidney injury. Baseline Creatinine of 1.95, BUN 70, and EGFR 15 on admission.   Patient seen laying in bed, preparing to eat breakfast Alert and oriented States appetite is improving Currently on 5L [place] Says he can notice a small improvement in breathing   Objective:  Vital signs in last 24 hours:  Temp:  [97.4 F (36.3 C)-98.7 F (37.1 C)] 97.4 F (36.3 C) (04/27 0737) Pulse Rate:  [45-57] 53 (04/27 0737) Resp:  [18-20] 18 (04/27 0737) BP: (128-140)/(50-78) 135/77 (04/27 0737) SpO2:  [95 %-100 %] 100 % (04/27 0737) Weight:  [83.2 kg] 83.2 kg (04/27 0500)  Weight change:  Filed Weights   02/24/21 0431 02/25/21 0326 02/27/21 0500  Weight: 82.1 kg 82.7 kg 83.2 kg    Intake/Output: I/O last 3 completed shifts: In: 1066.5 [P.O.:720; I.V.:146.5; IV Piggyback:200] Out: 2200 [Urine:2200]   Intake/Output this shift:  No intake/output data recorded.  Physical Exam: General: NAD  Head: Normocephalic, atraumatic. Moist oral mucosal membranes  Eyes: Anicteric  Lungs:  Diminished in left base,O2 5L [place]  Heart: Regular rate and rhythm  Abdomen:  Soft, nontender,   Extremities:  no peripheral edema.  Neurologic: Nonfocal, moving all four extremities  Skin: No lesions  GU Foley    Basic Metabolic Panel: Recent Labs  Lab 02/22/21 0447 02/23/21 0531 02/23/21 1606 02/24/21 0419 02/25/21 0531 02/26/21 0003 02/27/21 0558  NA 136 138  --  138  --  136 135  K 5.1 4.7  --  4.3  --  4.3 3.8  CL 104 101  --  102  --  98 97*  CO2 21* 23  --  25  --  27 28  GLUCOSE 105* 133*  --  203*  --  219* 181*  BUN 59* 56*  --  53*  --  45* 40*  CREATININE 3.00* 2.95*  --  2.53* 2.42* 2.41*  2.21*  CALCIUM 8.0* 8.7*  --  8.4*  --  8.2* 8.2*  PHOS  --   --  4.2  --   --   --   --     Liver Function Tests: No results for input(s): AST, ALT, ALKPHOS, BILITOT, PROT, ALBUMIN in the last 168 hours. No results for input(s): LIPASE, AMYLASE in the last 168 hours. No results for input(s): AMMONIA in the last 168 hours.  CBC: Recent Labs  Lab 02/23/21 0531 02/24/21 0419 02/25/21 0531 02/26/21 0003 02/27/21 0558  WBC 7.0 7.4 5.7 6.3 5.9  NEUTROABS 5.0 5.4 3.9 4.7 4.1  HGB 9.1* 9.0* 8.5* 8.3* 8.0*  HCT 29.3* 29.7* 28.7* 27.0* 26.5*  MCV 77.5* 79.2* 79.7* 79.2* 79.1*  PLT 261 264 254 236 232    Cardiac Enzymes: No results for input(s): CKTOTAL, CKMB, CKMBINDEX, TROPONINI in the last 168 hours.  BNP: Invalid input(s): POCBNP  CBG: Recent Labs  Lab 02/26/21 0803 02/26/21 1132 02/26/21 1656 02/26/21 2103 02/27/21 0738  GLUCAP 157* 279* 151* 139* 175*    Microbiology: Results for orders placed or performed during the hospital encounter of 02/19/21  Resp Panel by RT-PCR (Flu A&B, Covid) Nasopharyngeal Swab     Status: None   Collection Time: 02/19/21  9:56 AM  Specimen: Nasopharyngeal Swab; Nasopharyngeal(NP) swabs in vial transport medium  Result Value Ref Range Status   SARS Coronavirus 2 by RT PCR NEGATIVE NEGATIVE Final    Comment: (NOTE) SARS-CoV-2 target nucleic acids are NOT DETECTED.  The SARS-CoV-2 RNA is generally detectable in upper respiratory specimens during the acute phase of infection. The lowest concentration of SARS-CoV-2 viral copies this assay can detect is 138 copies/mL. A negative result does not preclude SARS-Cov-2 infection and should not be used as the sole basis for treatment or other patient management decisions. A negative result may occur with  improper specimen collection/handling, submission of specimen other than nasopharyngeal swab, presence of viral mutation(s) within the areas targeted by this assay, and inadequate number of  viral copies(<138 copies/mL). A negative result must be combined with clinical observations, patient history, and epidemiological information. The expected result is Negative.  Fact Sheet for Patients:  EntrepreneurPulse.com.au  Fact Sheet for Healthcare Providers:  IncredibleEmployment.be  This test is no t yet approved or cleared by the [place] FDA and  has been authorized for detection and/or diagnosis of SARS-CoV-2 by FDA under an Emergency Use Authorization (EUA). This EUA will remain  in effect (meaning this test can be used) for the duration of the COVID-19 declaration under Section 564(b)(1) of the Act, 21 U.S.C.section 360bbb-3(b)(1), unless the authorization is terminated  or revoked sooner.       Influenza A by PCR NEGATIVE NEGATIVE Final   Influenza B by PCR NEGATIVE NEGATIVE Final    Comment: (NOTE) The Xpert Xpress SARS-CoV-2/FLU/RSV plus assay is intended as an aid in the diagnosis of influenza from Nasopharyngeal swab specimens and should not be used as a sole basis for treatment. Nasal washings and aspirates are unacceptable for Xpert Xpress SARS-CoV-2/FLU/RSV testing.  Fact Sheet for Patients: EntrepreneurPulse.com.au  Fact Sheet for Healthcare Providers: IncredibleEmployment.be  This test is not yet approved or cleared by the [place] FDA and has been authorized for detection and/or diagnosis of SARS-CoV-2 by FDA under an Emergency Use Authorization (EUA). This EUA will remain in effect (meaning this test can be used) for the duration of the COVID-19 declaration under Section 564(b)(1) of the Act, 21 U.S.C. section 360bbb-3(b)(1), unless the authorization is terminated or revoked.  Performed at [facility], [address] [place], [place] [address]     Coagulation Studies: No results for input(s): LABPROT, INR in the last 72 hours.  Urinalysis: No  results for input(s): COLORURINE, LABSPEC, PHURINE, GLUCOSEU, HGBUR, BILIRUBINUR, KETONESUR, PROTEINUR, UROBILINOGEN, NITRITE, LEUKOCYTESUR in the last 72 hours.  Invalid input(s): APPERANCEUR    Imaging: DG Chest Port 1 View  Result Date: 02/26/2021 CLINICAL DATA:  Hypoxia. EXAM: PORTABLE CHEST 1 VIEW COMPARISON:  02/22/2021.  CT 02/02/2021. FINDINGS: Prior CABG. Cardiomegaly. Diffuse bilateral interstitial prominence consistent with interstitial edema and or pneumonitis. Small left pleural effusion again noted. No pneumothorax. Chest is unchanged from prior exam. IMPRESSION: Prior CABG. Cardiomegaly. Diffuse bilateral interstitial prominence consistent interstitial edema and or pneumonitis again noted without interim change. Small left pleural effusion again noted without interim change. Electronically Signed   By: Marcello Moores  Register   On: 02/26/2021 07:50     Medications:   . sodium chloride 250 mL (02/24/21 1235)  . furosemide (LASIX) 200 mg in dextrose 5% 100 mL (74m/mL) infusion 6 mg/hr (02/26/21 1118)   . sodium chloride   Intravenous Once  . acidophilus  1 capsule Oral Daily  . apixaban  5 mg Oral BID  . cholecalciferol  1,000 Units Oral Daily  . gabapentin  100 mg Oral BID  . hydrocortisone  25 mg Rectal BID  . insulin aspart  0-15 Units Subcutaneous TID WC  . iron polysaccharides  150 mg Oral Daily  . magnesium oxide  400 mg Oral Daily  . metolazone  2.5 mg Oral Daily  . multivitamin-lutein  1 capsule Oral Daily  . nicotine  21 mg Transdermal Daily  . pantoprazole  40 mg Oral Daily  . Ensure Max Protein  11 oz Oral BID BM  . simvastatin  20 mg Oral q AM  . tiotropium  18 mcg Inhalation Daily  . traZODone  50 mg Oral QHS   sodium chloride, acetaminophen, albuterol, ipratropium-albuterol, melatonin, ondansetron **OR** ondansetron (ZOFRAN) IV, oxyCODONE    Renal U/s  FINDINGS: Right Kidney:   Renal measurements: 9.8 x 4.5 x 4.0 cm = volume: 91.2 mL. Mild increased  echogenicity. No mass or hydronephrosis visualized.   Left Kidney:   Renal measurements: 9.4 x 5.3 x 5.1 cm = volume: 132.8 mL. Mild increased echogenicity. No mass or hydronephrosis visualized.   Bladder:   Mild bladder distention cannot be excluded.   Other:   Small right pleural effusion cannot be excluded.   IMPRESSION: 1. Mild increased echogenicity both kidneys suggesting chronic medical renal disease. No acute renal abnormality identified.   2.  Bladder distention cannot be excluded.   3.  Small right pleural effusion cannot be excluded.     Assessment/ Plan:  [name] is a 75 y.o.  male with past medical history of chronic Atrial Fibrillation, CHF, diabetes, CKD stage 3, GERD and hypertension. He presents to the ED with generalized weakness for 3 days.   1) AKI secondary to  ATN with  CKD risk factors include diabetes, hypertension, age, atherosclerosis.  Increased echogenicity noted in both kidneys. Lab Results  Component Value Date   CREATININE 2.21 (H) 02/27/2021   CREATININE 2.41 (H) 02/26/2021   CREATININE 2.42 (H) 02/25/2021    Renal function continues to improve Urine output 1.3L   2) volume overload Lasix drip 61m/hr  Patient appears more comfortable   3)Anemia of chronic disease and iron deficiency anemia  CBC Latest Ref Rng & Units 02/27/2021 02/26/2021 02/25/2021  WBC 4.0 - 10.5 K/uL 5.9 6.3 5.7  Hemoglobin 13.0 - 17.0 g/dL 8.0(L) 8.3(L) 8.5(L)  Hematocrit 39.0 - 52.0 % 26.5(L) 27.0(L) 28.7(L)  Platelets 150 - 400 K/uL 232 236 254    Results for RAVELINO, HERREN(MRN [id number] as of 02/23/2021 15:43  Ref. Range 01/28/2021 05:43  Iron Latest Ref Range: 45 - 182 ug/dL 25 (L)  UIBC Latest Units: ug/dL 431  TIBC Latest Ref Range: 250 - 450 ug/dL 456 (H)  Saturation Ratios Latest Ref Range: 17.9 - 39.5 % 6 (L)   Patient did receive PRBC during this admission Will continue to monitor       LOS: 8   4/27/202210:21  AM [facility]  Patient was seen and evaluated with SColon Flattery NP.  Plan of care was discussed with patient as well as NP.  I agree with the note as documented with edits.

## 2021-02-27 NOTE — Progress Notes (Addendum)
PROGRESS NOTE    Zakaria Sedor  ACZ:[id number] DOB: 01-04-1946 DOA: 02/19/2021 PCP: [name], MD   Brief Narrative:  Bayley Hurn is a 75 y.o. 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 Patient states that he has had weakness for the last 3 days which has made it difficult for him to ambulate.  This morning he states he was unable to get off the toilet and actually had a fall this morning. He denies feeling dizzy or lightheaded and complains of just generalized weakness. He denies having any chest pain, no shortness of breath, no fever, no chills, no headache, no abdominal pain, no nausea, no vomiting, no diarrhea, no urinary symptoms, no blurred vision, no difficulty swallowing, no headache.  Found to be bradycardic, hypoglycemic, borderline hypotensive.  Found to have significant AKI thought to be due to overdiuresis.  Case discussed with cardiology and nephrology.  Recommendations appreciated.  4/21: This morning patient was complaining of worsening shortness of breath.  Stat chest x-ray demonstrates fluid overload.  Administered 60 mg IV Lasix.  CPAP ordered.  4/22: Patient still endorsing shortness of breath although improved from prior.  Tolerated BiPAP okay.  Does better with it on. 4/23: On Lasix infusion.  Continues to require CPAP. 4/24: Slowly improving.  Alternate between 4 L nasal cannula and CPAP. remains on Lasix drip 4/26: Dyspnea and fatigue persistent.  Patient had -5.7 L however chest x-ray today continues to demonstrate vascular congestion.  Patient alternating between 4 L nasal cannula and CPAP.  Case discussed with nephrology, Lasix GTT increased to 6 mg/h and added metolazone 2.5 mg daily 4/27:  stable, continues lasix gtt    Assessment & Plan:   Principal Problem:   AKI (acute kidney injury) ([place]) Active Problems:   Chronic kidney disease (CKD) stage G3b/A3, moderately decreased glomerular filtration rate (GFR) between 30-44 mL/min/1.73 square meter and albuminuria creatinine ratio greater than 300 mg/g (HCC)   History of CVA (cerebrovascular accident)   Chronic diastolic CHF (congestive heart failure) (HCC)   Atrial fibrillation, chronic (HCC)   Type 2 diabetes mellitus with hypoglycemia without coma (HCC)   Bradycardia   Weakness generalized   Dental infection   Pressure injury of skin  Acute kidney injury on chronic kidney disease stage IIIb  Acute tubular necrosis Hyperkalemia Hyponatremia Developed edema on chest x-ray At baseline patient has a serum creatinine of 1.95, on admission his creatinine is 3.95 Downtrending, 2.21 today Nephrology consulted, have been following for course of admission. Normal EF and diastolic parameters on January 2022. Plan: No IV fluids Cont Lasix GTT to 6 mg/h Added metolazone 2.5 mg daily Careful monitoring of volume status Daily BMP  Acute on chronic anemia Melana Hb drifting down to 7 on 4/22. Received one unit. Slow downtrend since then, hgt 8 today Patient with cardiac history. Report of melanotic stool today Ideal Hb goal 8 Plan: - will hold eliquis and consult GI, start PPI BID  Atrial fibrillation with slow ventricular response Chronic issue per Dr. Ubaldo Glassing who the patient is well-known to Plan: Avoid AV nodal blocking agents - holding apixaban as above  Diabetes mellitus with complications of stage III chronic kidney disease Now with hyperglycemia - re-start home lantus at lower dose of 10 - Start  SSI  COPD with acute on chronic respiratory failure Not acutely exacerbated Continue Spiriva, as needed bronchodilator therapy and inhaled steroids Wean oxygen as tolerated Home CPAP when sleeping  Acute on  chronic diastolic congestive heart failure Diuresis Lasix GTT  Holding home diuretics  Coronary artery disease Stable and not acutely exacerbated Continue statins and aspirin Hold nitrates  Generalized weakness/fall  Multifactorial and related to bradycardia as well as hypotension Place patient on fall precautions PT/OT advising SNF  Chronic pain - will hold gabapentin/oxycodone given signs sedation  Dental infection Patient noted to have purulence around his gum line Cervical spine CT shows an expansile lucent lesion with pathologic fracture  identified involving the midline the maxilla. Recommend further evaluation with dedicated maxillofacial CT with contrast. ENT was consulted in the ER and he recommends to obtain maxillofacial CT with contrast once renal function improves Plan: Continue Unasyn for now, last dose today of 7-day course Pursue contrast-enhanced CT maxillofacial once creatinine will allow Will touch base w/ ENT Richardson Landry) as don't see formal consult documented, want to make sure this plan remains appropriate   DVT prophylaxis: SCDs Code Status: Full Family Communication:  Wife updated telephonically 4/27 Disposition Plan: Status is: Inpatient  Remains inpatient appropriate because:Inpatient level of care appropriate due to severity of illness   Dispo: The patient is from: Home              Anticipated d/c is to: SNF (care mgmt says it is arranged, just needs notification when nearing discharge to arrange authorization)              Patient currently is not medically stable to d/c.   Difficult to place patient No AKI on CKD.  Fluid overloaded hypoxic respiratory failure.  Remains dependent on Lasix GTT.  Several days prior to disposition.  Level of care: Progressive Cardiac  Consultants:  Nephrology Cardiology GI   Procedures:None  Antimicrobials:   Unasyn   Subjective: Patient seen and examined.  Continues to be fatigued.  States he feels  okay for short periods of time but quickly becomes short of breath and requires CPAP. Has some appetite.  Objective: Vitals:   02/27/21 0429 02/27/21 0500 02/27/21 0737 02/27/21 1148  BP: 140/71  135/77 (!) 125/55  Pulse: (!) 45  (!) 53 (!) 44  Resp: 20  18 20   Temp: 98.4 F (36.9 C)  (!) 97.4 F (36.3 C) 98.2 F (36.8 C)  TempSrc: Oral  Oral Oral  SpO2: 95%  100% 100%  Weight:  83.2 kg    Height:        Intake/Output Summary (Last 24 hours) at 02/27/2021 1413 Last data filed at 02/27/2021 0737 Gross per 24 hour  Intake 346.49 ml  Output 2100 ml  Net -1753.51 ml   Filed Weights   02/24/21 0431 02/25/21 0326 02/27/21 0500  Weight: 82.1 kg 82.7 kg 83.2 kg    Examination:  General exam: No acute distress Respiratory system: Scattered crackles bilaterally.  Normal work of breathing.  5 L cardiovascular system: S1-S2, regular rate and rhythm, no murmurs, 1+ pedal edema  gastrointestinal system: Abdomen is nondistended, soft and nontender. No organomegaly or masses felt. Normal bowel sounds heard. Central nervous system: Alert, oriented x2, no focal deficits, lethargic extremities: Symmetric 5 x 5 power. Skin: No rashes, lesions or ulcers Psychiatry: Judgement and insight appear impaired. Mood & affect flattened.     Data Reviewed: I have personally reviewed following labs and imaging studies  CBC: Recent Labs  Lab 02/23/21 0531 02/24/21 0419 02/25/21 0531 02/26/21 0003 02/27/21 0558  WBC 7.0 7.4 5.7 6.3 5.9  NEUTROABS 5.0 5.4 3.9 4.7 4.1  HGB 9.1* 9.0* 8.5* 8.3* 8.0*  HCT 29.3* 29.7* 28.7* 27.0* 26.5*  MCV 77.5* 79.2* 79.7* 79.2* 79.1*  PLT 261 264 254 236 175   Basic Metabolic Panel: Recent Labs  Lab 02/22/21 0447 02/23/21 0531 02/23/21 1606 02/24/21 0419 02/25/21 0531 02/26/21 0003 02/27/21 0558  NA 136 138  --  138  --  136 135  K 5.1 4.7  --  4.3  --  4.3 3.8  CL 104 101  --  102  --  98 97*  CO2 21* 23  --  25  --  27 28  GLUCOSE 105* 133*  --   203*  --  219* 181*  BUN 59* 56*  --  53*  --  45* 40*  CREATININE 3.00* 2.95*  --  2.53* 2.42* 2.41* 2.21*  CALCIUM 8.0* 8.7*  --  8.4*  --  8.2* 8.2*  PHOS  --   --  4.2  --   --   --   --    GFR: Estimated Creatinine Clearance: 29.2 mL/min (A) (by C-G formula based on SCr of 2.21 mg/dL (H)). Liver Function Tests: No results for input(s): AST, ALT, ALKPHOS, BILITOT, PROT, ALBUMIN in the last 168 hours. No results for input(s): LIPASE, AMYLASE in the last 168 hours. No results for input(s): AMMONIA in the last 168 hours. Coagulation Profile: No results for input(s): INR, PROTIME in the last 168 hours. Cardiac Enzymes: No results for input(s): CKTOTAL, CKMB, CKMBINDEX, TROPONINI in the last 168 hours. BNP (last 3 results) No results for input(s): PROBNP in the last 8760 hours. HbA1C: No results for input(s): HGBA1C in the last 72 hours. CBG: Recent Labs  Lab 02/26/21 1132 02/26/21 1656 02/26/21 2103 02/27/21 0738 02/27/21 1149  GLUCAP 279* 151* 139* 175* 259*   Lipid Profile: No results for input(s): CHOL, HDL, LDLCALC, TRIG, CHOLHDL, LDLDIRECT in the last 72 hours. Thyroid Function Tests: No results for input(s): TSH, T4TOTAL, FREET4, T3FREE, THYROIDAB in the last 72 hours. Anemia Panel: No results for input(s): VITAMINB12, FOLATE, FERRITIN, TIBC, IRON, RETICCTPCT in the last 72 hours. Sepsis Labs: No results for input(s): PROCALCITON, LATICACIDVEN in the last 168 hours.  Recent Results (from the past 240 hour(s))  Resp Panel by RT-PCR (Flu A&B, Covid) Nasopharyngeal Swab     Status: None   Collection Time: 02/19/21  9:56 AM   Specimen: Nasopharyngeal Swab; Nasopharyngeal(NP) swabs in vial transport medium  Result Value Ref Range Status   SARS Coronavirus 2 by RT PCR NEGATIVE NEGATIVE Final    Comment: (NOTE) SARS-CoV-2 target nucleic acids are NOT DETECTED.  The SARS-CoV-2 RNA is generally detectable in upper respiratory specimens during the acute phase of  infection. The lowest concentration of SARS-CoV-2 viral copies this assay can detect is 138 copies/mL. A negative result does not preclude SARS-Cov-2 infection and should not be used as the sole basis for treatment or other patient management decisions. A negative result may occur with  improper specimen collection/handling, submission of specimen other than nasopharyngeal swab, presence of viral mutation(s) within the areas targeted by this assay, and inadequate number of viral copies(<138 copies/mL). A negative result must be combined with clinical observations, patient history, and epidemiological information. The expected result is Negative.  Fact Sheet for Patients:  EntrepreneurPulse.com.au  Fact Sheet for Healthcare Providers:  IncredibleEmployment.be  This test is  no t yet approved or cleared by the [place] and  has been authorized for detection and/or diagnosis of SARS-CoV-2 by FDA under an Emergency Use Authorization (EUA). This EUA will remain  in effect (meaning this test can be used) for the duration of the COVID-19 declaration under Section 564(b)(1) of the Act, 21 U.S.C.section 360bbb-3(b)(1), unless the authorization is terminated  or revoked sooner.       Influenza A by PCR NEGATIVE NEGATIVE Final   Influenza B by PCR NEGATIVE NEGATIVE Final    Comment: (NOTE) The Xpert Xpress SARS-CoV-2/FLU/RSV plus assay is intended as an aid in the diagnosis of influenza from Nasopharyngeal swab specimens and should not be used as a sole basis for treatment. Nasal washings and aspirates are unacceptable for Xpert Xpress SARS-CoV-2/FLU/RSV testing.  Fact Sheet for Patients: EntrepreneurPulse.com.au  Fact Sheet for Healthcare Providers: IncredibleEmployment.be  This test is not yet approved or cleared by the [place] FDA and has been authorized for detection and/or diagnosis of SARS-CoV-2  by FDA under an Emergency Use Authorization (EUA). This EUA will remain in effect (meaning this test can be used) for the duration of the COVID-19 declaration under Section 564(b)(1) of the Act, 21 U.S.C. section 360bbb-3(b)(1), unless the authorization is terminated or revoked.  Performed at [facility], [address], [place], [place] [address]          Radiology Studies: [facility] Chest [place] 1 View  Result Date: 02/26/2021 CLINICAL DATA:  Hypoxia. EXAM: PORTABLE CHEST 1 VIEW COMPARISON:  02/22/2021.  CT 02/02/2021. FINDINGS: Prior CABG. Cardiomegaly. Diffuse bilateral interstitial prominence consistent with interstitial edema and or pneumonitis. Small left pleural effusion again noted. No pneumothorax. Chest is unchanged from prior exam. IMPRESSION: Prior CABG. Cardiomegaly. Diffuse bilateral interstitial prominence consistent interstitial edema and or pneumonitis again noted without interim change. Small left pleural effusion again noted without interim change. Electronically Signed   By: Marcello Moores  Register   On: 02/26/2021 07:50        Scheduled Meds: . sodium chloride   Intravenous Once  . acidophilus  1 capsule Oral Daily  . apixaban  5 mg Oral BID  . cholecalciferol  1,000 Units Oral Daily  . gabapentin  100 mg Oral BID  . hydrocortisone  25 mg Rectal BID  . insulin aspart  0-20 Units Subcutaneous TID WC  . iron polysaccharides  150 mg Oral Daily  . magnesium oxide  400 mg Oral Daily  . metolazone  2.5 mg Oral Daily  . multivitamin-lutein  1 capsule Oral Daily  . nicotine  21 mg Transdermal Daily  . pantoprazole  40 mg Oral Daily  . Ensure Max Protein  11 oz Oral BID BM  . simvastatin  20 mg Oral q AM  . tiotropium  18 mcg Inhalation Daily  . traZODone  50 mg Oral QHS   Continuous Infusions: . sodium chloride 250 mL (02/24/21 1235)  . furosemide (LASIX) 200 mg in dextrose 5% 100 mL (2mg /mL) infusion 6 mg/hr (02/26/21 1118)     LOS: 8 days    Time spent:  35 minutes    Desma Maxim, MD [facility]  If 7PM-7AM, please contact night-coverage 02/27/2021, 2:13 PM

## 2021-02-27 NOTE — Progress Notes (Signed)
[facility]  Patient Description: [name] is a 75 year old male with PMH significant for CAD s/p CABG (2006), h/o HFrEF (most recent EF = 50-55%), paroxysmal atrial fibrillation (on Eliquis), h/o aortic valve stenosis, h/o CVA, CKD, DM type II, hyperlipidemia, hypertension, COPD and tobacco use who was admitted due to acute CHF exacerbation and AKI.  SUBJECTIVE: UTA.   02/26/2021: The patient reports to be feeling slightly better on today. He reports that his dyspnea remains unchanged and he continues to deny having any chest pain, dizziness or palpitations at this time.   02/25/21: The patient reports to be doing "okay" on today and he denies having any chest pain at this time. He states that he continues to have dyspnea that remains unchanged since his admission.   02/22/2021: The patient reports to be doing " so, so" on today and states that he is having a hard time breathing with the CPAP machine.  He states that it "feels as if I am not getting enough air" on the CPAP machine. The patient's oxygen saturation is at 98%, respirations are 16 and he has a slightly increased effort of breathing at this time.  The patient denies having any chest pain, palpitations, peripheral edema or dizziness at this time. However, the patient does report tenderness to the BLE upon palpation.   OBJECTIVE: The patient is sleeping comfortably with CPAP machine on. He is Alert and woke up when he heard his name called and fell back to sleep shortly afterwards. The patient appears to be better on today, he appears euvolemic and he does not appear to be in any distress. The patient continues to be in atrial fibrillation with slow ventricular response as exemplified on the telemetry monitor. The patient's foley catheter remains in place and he appears to have adeqaute urine output.    02/26/21:The patient appears much better on today as he is more interactive and smiling. He continues to be in atrial fibrilation with slow  ventricular response as noted by the bedside telemetry monitoring. The patient continues to have vascular congestion, despite the decrease in his peripheral edema. He continues to be on a IV Lasix drip which has been increased by nephrology to 6 mg/hr and he has also been placed on metalazone 2.5mg  daily. The patient has good urinary output at this time and the indwelling catheter remains in place. The patient appears fair on today, his vss and he is in no apparent distress.  The patient continues to be on supplemental oxygen via nasal cannula and utilizes CPAP machine at night.   02/25/21: The patient is sitting comfortably in bed eating lunch. He is now on supplemental oxygen via nasal cannula with oxygen saturations that are within normal range.  The patient continues to be in atrial fibrillation with a slow ventricular response in the high 40s.  The patient continues to remain asymptomatic.  The patient continues to be on a furosemide drip due to hypervolemia. Upon examination he appears to having +1 pitting edema in the BLE. The patient appears acutely ill, his other vital signs are stable and he is in no apparent distress.  02/22/2021: The patient appears chronically ill on today, he continues to be in fibrillation with slow ventricular response and a HR between 38-69 bpm as noted by the bedside telemetry monitor.  The patient continues to remain asymptomatic and all of his other vital signs are stable. The patient's hemoglobin on today is 7.0 and he will be undergoing a blood transfusion proceeded by  IV lasix. The patient appears euvolemic on today and has minimal, nonpitting BLE edema.  Vitals:   02/27/21 0429 02/27/21 0500 02/27/21 0737 02/27/21 1148  BP: 140/71  135/77 (!) 125/55  Pulse: (!) 45  (!) 53 (!) 44  Resp: 20  18 20   Temp: 98.4 F (36.9 C)  (!) 97.4 F (36.3 C) 98.2 F (36.8 C)  TempSrc: Oral  Oral Oral  SpO2: 95%  100% 100%  Weight:  83.2 kg    Height:         Intake/Output  Summary (Last 24 hours) at 02/27/2021 1321 Last data filed at 02/27/2021 1751 Gross per 24 hour  Intake 586.49 ml  Output 2100 ml  Net -1513.51 ml      PHYSICAL EXAM  General: Well developed, well nourished, in no acute distress HEENT:  Normocephalic and atraumatic. PERRL Neck:  No JVD.  Lungs: clear bilaterally to auscultation.  Chest expansion symmetrical, normal effort of breathing, negative for rhonchi, crackles or rales Heart: Irregular heart rate and rhythm. Normal S1 and S2 without gallops or murmurs.  Abdomen: Bowel sounds are positive, abdomen soft and non-tender  Msk:  Normal strength and tone for age. Extremities: Mild, nonpitting BLE edema.  No clubbing or cyanosis Neuro: Alert and oriented X 3. Psych:  Good affect, responds appropriately   LABS: Basic Metabolic Panel: Recent Labs    02/26/21 0003 02/27/21 0558  NA 136 135  K 4.3 3.8  CL 98 97*  CO2 27 28  GLUCOSE 219* 181*  BUN 45* 40*  CREATININE 2.41* 2.21*  CALCIUM 8.2* 8.2*   Liver Function Tests: No results for input(s): AST, ALT, ALKPHOS, BILITOT, PROT, ALBUMIN in the last 72 hours. No results for input(s): LIPASE, AMYLASE in the last 72 hours. CBC: Recent Labs    02/26/21 0003 02/27/21 0558  WBC 6.3 5.9  NEUTROABS 4.7 4.1  HGB 8.3* 8.0*  HCT 27.0* 26.5*  MCV 79.2* 79.1*  PLT 236 232   Cardiac Enzymes: No results for input(s): CKTOTAL, CKMB, CKMBINDEX, TROPONINI in the last 72 hours. BNP: Invalid input(s): POCBNP D-Dimer: No results for input(s): DDIMER in the last 72 hours. Hemoglobin A1C: No results for input(s): HGBA1C in the last 72 hours. Fasting Lipid Panel: No results for input(s): CHOL, HDL, LDLCALC, TRIG, CHOLHDL, LDLDIRECT in the last 72 hours. Thyroid Function Tests: No results for input(s): TSH, T4TOTAL, T3FREE, THYROIDAB in the last 72 hours.  Invalid input(s): FREET3 Anemia Panel: No results for input(s): VITAMINB12, FOLATE, FERRITIN, TIBC, IRON, RETICCTPCT in the  last 72 hours.  DG Chest Port 1 View  Result Date: 02/26/2021 CLINICAL DATA:  Hypoxia. EXAM: PORTABLE CHEST 1 VIEW COMPARISON:  02/22/2021.  CT 02/02/2021. FINDINGS: Prior CABG. Cardiomegaly. Diffuse bilateral interstitial prominence consistent with interstitial edema and or pneumonitis. Small left pleural effusion again noted. No pneumothorax. Chest is unchanged from prior exam. IMPRESSION: Prior CABG. Cardiomegaly. Diffuse bilateral interstitial prominence consistent interstitial edema and or pneumonitis again noted without interim change. Small left pleural effusion again noted without interim change. Electronically Signed   By: Marcello Moores  Register   On: 02/26/2021 07:50     Echo: (11/15/2020)  IMPRESSIONS  1. Left ventricular ejection fraction, by estimation, is 50 to 55%. The  left ventricle has low normal function. The left ventricle has no regional  wall motion abnormalities. Left ventricular diastolic parameters were  normal.  2. Right ventricular systolic function is normal. The right ventricular  size is mildly enlarged.  3. The mitral valve was  not well visualized. Mild mitral valve  regurgitation.  4. The aortic valve is calcified. Aortic valve regurgitation is trivial.  5. Aortic dilatation noted.   TELEMETRY: Atrial fibrillation with slow ventricular response  ASSESSMENT AND PLAN:  Principal Problem:   AKI (acute kidney injury) ([place]) Active Problems:   Chronic kidney disease (CKD) stage G3b/A3, moderately decreased glomerular filtration rate (GFR) between 30-44 mL/min/1.73 square meter and albuminuria creatinine ratio greater than 300 mg/g (HCC)   History of CVA (cerebrovascular accident)   Chronic diastolic CHF (congestive heart failure) (HCC)   Atrial fibrillation, chronic (HCC)   Type 2 diabetes mellitus with hypoglycemia without coma (HCC)   Bradycardia   Weakness generalized   Dental infection   Pressure injury of skin    1. Acute on Chronic diastolic HF,  reasonably stable  -Gentle diuresis with IV lasix gtt which has been increased to 6mg /hr by Nephrology.  Nephrology input appreciated.  -Avoid AV nodal blocking agents at this time due to slow ventricular response.  -Recommend following a low-sodium diet, performing daily weights and monitoring strict I's and O's.  2. Atrial fibrillation with slow ventricular response, reasonably stable  -We will continue anticoagulation with Eliquis.  -Recommend following bleeding precautions.  -Rate control not indicated at this time in the presence of slow ventricular response.   -Recommend continuous telemetry monitoring until discharge.  3. CAD s/p CABG, stable, not exacerbated at this time  -Recommend continuing statin therapy.  -Recommend following heart healthy diet.  4. AKI, fairly stable with improvement in creatinine  -Nephrology consult appreciated.  5. Severe anemia s/p blood transfusion of 02/22/21, patient's hemoglobin level today is 8.5>>8.3>>8.0  -Agree with current management.  6.  COPD with acute on chronic respiratory failure, fairly stable, patient is having dyspnea at this time  -Agree with current therapy with supplemental oxygen via nasal cannula and CPAP use while asleep.   -RT evaluation and treatment greatly appreciated.  -Recommend PT/OT evaluation.   7. HTN, reasonably controlled, patient normotensive on today  -Recommend continuing to follow a low-sodium diet.  8. HLD  -Recommend continuing statin therapy.     Gerrica Cygan, ACNPC-AG  02/27/2021 1:21 PM

## 2021-02-27 NOTE — Consult Note (Signed)
Consultation  Referring Provider:     Dr Si Raider Admit date  02/19/21 Consult date    02/27/21     Reason for Consultation:     melena         HPI:   [name] is a 75 y.o. male medical history significant for chronic atrial fibrillation, history of CHF, history of COPD with chronic respiratory failure on 2 L of oxygen., diabetes mellitus with chronic kidney disease stage III, GERD, hypertension and history of CVA, CKD. He has had 4 hospitalizations over the last few months for cardiopulmonary issues. He was admitted last week with generalized weakness/dehydration, dental infection, atrial fibrillation with bradycardia. Cardiology and nephrology have been following- developed pumonary edema while hospitalized and now on lasix gtt. He is on antibiotics for the dental infection. Gi has now been consulted for concerns of melena and anemia- has received 1u PRBC since hospitalized for hgb in 7s. hgb now 8. BID pantoprazole 40mg  iv has been started and eliquis has been held. Patient reports his is doing well in his abdomen. Denies any problems with dysphagia/gerd, abdominal pain, NVD, bloody stools, black stools, bowel habit changes, and further GI concerns. States his breathing today is "so-so", that his ability to get around at home has significantly declined over the last couple of months. Nurse voices patient has had one black stool today- none further.  Patient feels like his stool was dark due to the iron he is taking.   Marland Kitchen PREVIOUS ENDOSCOPIES:            Colonoscopy 2015- couple of adenomatous polyps, some hyperplastic polyps. ACBE- remote, reported negative findings  Past Medical History:  Diagnosis Date  . Arrhythmia    atrial fibrillation  . Atrial fibrillation ([place])   . Basal cell carcinoma 05/2019   right nasal ala, Tx: EDC  . Basal cell carcinoma 04/12/2019   Left posterior ear. Nodular pattern, excoriated.   . CHF (congestive heart failure) ([place])   . COPD (chronic obstructive pulmonary  disease) ([place])   . Coronary artery disease   . Diabetes mellitus without complication ([place])   . GERD (gastroesophageal reflux disease)   . HLD (hyperlipidemia)   . Hypertension   . Peripheral vascular disease ([place])   . Pneumonia   . Prostate cancer ([place])   . Squamous cell carcinoma of skin 08/10/2018   Left upper arm above elbow. [facility].  Marland Kitchen Squamous cell carcinoma of skin 09/07/2018   Right forearm, below elbow. [facility]. [facility] 01/10/2019.  Marland Kitchen Stroke [facility])     Past Surgical History:  Procedure Laterality Date  . COLONOSCOPY    . CORONARY ARTERY BYPASS GRAFT  2006  . EYE SURGERY    . GOLD SEED IMPLANT N/A 12/30/2016   Procedure: GOLD SEED IMPLANT  x3;  Surgeon: Hollice Espy, MD;  Location: [facility] ORS;  Service: Urology;  Laterality: N/A;  . HEMORRHOID SURGERY N/A 10/02/2016   Procedure: PROCTOSCOPY AND CONTROL OF RECTAL BLEEDING;  Surgeon: Excell Seltzer, MD;  Location: [facility] ORS;  Service: General;  Laterality: N/A;  . PROSTATE BIOPSY    . TONSILLECTOMY      Family History  Problem Relation Age of Onset  . Lung cancer Mother   . Heart attack Father   . Hypertension Other   . Cancer Maternal Grandfather        prostate  . Cancer Paternal Grandmother        breast     Social History   Tobacco Use  .  Smoking status: Current Every Day Smoker    Packs/day: 1.00    Years: 54.00    Pack years: 54.00    Types: Cigarettes  . Smokeless tobacco: Never Used  Vaping Use  . Vaping Use: Never used  Substance Use Topics  . Alcohol use: No    Alcohol/week: 0.0 standard drinks  . Drug use: No    Prior to Admission medications   Medication Sig Start Date End Date Taking? Authorizing Provider  albuterol (PROVENTIL HFA;VENTOLIN HFA) 108 (90 Base) MCG/ACT inhaler Inhale 2 puffs into the lungs every 6 (six) hours as needed for wheezing or shortness of breath.   Yes [provider]  amLODipine (NORVASC) 5 MG tablet Take 1 tablet (5 mg total) by mouth daily. 12/30/19  Yes Ezekiel Slocumb, DO  apixaban (ELIQUIS) 5 MG TABS tablet Take 1 tablet (5 mg total) by mouth 2 (two) times daily. 03/12/20  Yes Darylene Price A, FNP  cholecalciferol (VITAMIN D) 1000 units tablet Take 1,000 Units by mouth daily.   Yes [provider]  Cinnamon 500 MG capsule Take 1,000 mg by mouth 2 (two) times daily.   Yes [provider]  COMBIVENT RESPIMAT 20-100 MCG/ACT AERS respimat Inhale 2 puffs into the lungs 4 (four) times daily as needed. 09/18/20  Yes [provider]  CRANBERRY EXTRACT PO Take 500 mg by mouth every morning.    Yes [provider]  gabapentin (NEURONTIN) 100 MG capsule Take 100 mg by mouth 2 (two) times daily.   Yes [provider]  hydrALAZINE (APRESOLINE) 50 MG tablet Take 1 tablet (50 mg total) by mouth every 8 (eight) hours. 10/15/19  Yes Ghimire, Henreitta Leber, MD  insulin aspart (NOVOLOG) 100 UNIT/ML injection 0-9 Units, Subcutaneous, 3 times daily with meals CBG < 70: Implement Hypoglycemia protocol/measures CBG 70 - 120: 0 units CBG 121 - 150: 1 unit CBG 151 - 200: 2 units CBG 201 - 250: 3 units CBG 251 - 300: 5 units CBG 301 - 350: 7 units CBG 351 - 400: 9 units CBG > 400: call MD Patient taking differently: Inject into the skin 3 (three) times daily with meals. 0-9 Units, Subcutaneous, 3 times daily with meals CBG < 70: Implement Hypoglycemia protocol/measures CBG 70 - 120: 0 units CBG 121 - 150: 1 unit CBG 151 - 200: 2 units CBG 201 - 250: 3 units CBG 251 - 300: 5 units CBG 301 - 350: 7 units CBG 351 - 400: 9 units CBG > 400: call MD 10/15/19  Yes Ghimire, Henreitta Leber, MD  iron polysaccharides (NIFEREX) 150 MG capsule Take 1 capsule (150 mg total) by mouth daily. Can take any over-the-counter iron supplement. 02/06/21  Yes Enzo Bi, MD  isosorbide mononitrate (IMDUR) 30 MG 24 hr tablet Take 1 tablet (30 mg total) by mouth daily. 10/15/19  Yes Ghimire, Henreitta Leber, MD  LANTUS SOLOSTAR 100 UNIT/ML Solostar Pen Inject 20  Units into the skin daily at 10 pm. This is reduced from your prior 45 units nightly. 02/05/21  Yes Enzo Bi, MD  losartan (COZAAR) 25 MG tablet Take 25 mg by mouth daily. 02/06/21  Yes [provider]  magnesium oxide (MAG-OX) 400 MG tablet Take 400 mg by mouth daily.   Yes [provider]  Multiple Vitamin (MULTIVITAMIN WITH MINERALS) TABS tablet Take 1 tablet by mouth daily.   Yes [provider]  nicotine (NICODERM CQ - DOSED IN MG/24 HOURS) 21 mg/24hr patch Place 1 patch (21  mg total) onto the skin daily for 28 days. 02/06/21 03/06/21 Yes Enzo Bi, MD  omeprazole (PRILOSEC) 20 MG capsule Take 20 mg by mouth daily.   Yes [provider]  potassium chloride SA (KLOR-CON) 20 MEQ tablet Take 20 mEq by mouth 2 (two) times daily.   Yes [provider]  simvastatin (ZOCOR) 20 MG tablet Take 20 mg by mouth in the morning.   Yes [provider]  SUPER B COMPLEX/C PO Take 1 tablet by mouth every morning.   Yes [provider]  tiotropium (SPIRIVA HANDIHALER) 18 MCG inhalation capsule Place 1 capsule (18 mcg total) into inhaler and inhale daily. 02/05/21 05/06/21 Yes Enzo Bi, MD  torsemide (DEMADEX) 20 MG tablet Take 40 mg by mouth daily.   Yes [provider]  traZODone (DESYREL) 50 MG tablet Take 50 mg by mouth at bedtime.   Yes [provider]  vitamin C (ASCORBIC ACID) 500 MG tablet Take 500 mg by mouth 2 (two) times daily.    Yes [provider]  acetaminophen (TYLENOL) 325 MG tablet Take 2 tablets (650 mg total) by mouth every 6 (six) hours as needed for mild pain (or Fever >/= 101). 01/20/16   Gouru, Illene Silver, MD  bifidobacterium infantis (ALIGN) capsule Take 1 capsule by mouth every morning.    [provider]  Ensure Max Protein (ENSURE MAX PROTEIN) LIQD Take 330 mLs (11 oz total) by mouth 2 (two) times daily between meals. 12/29/19   Nicole Kindred A, DO  glucose 4 GM chewable tablet Chew 1 tablet by mouth as  needed for low blood sugar.    [provider]  lactase (LACTAID) 3000 units tablet Take 3,000 Units by mouth 3 (three) times daily between meals as needed.    [provider]  multivitamin-lutein (OCUVITE-LUTEIN) CAPS capsule Take 1 capsule by mouth daily.    [provider]    Current Facility-Administered Medications  Medication Dose Route Frequency Provider Last Rate Last Admin  . 0.9 %  sodium chloride infusion (Manually program via Guardrails IV Fluids)   Intravenous Once Sreenath, Sudheer B, MD      . 0.9 %  sodium chloride infusion   Intravenous PRN Priscella Mann, Sudheer B, MD 8 mL/hr at 02/24/21 1235 250 mL at 02/24/21 1235  . acetaminophen (TYLENOL) tablet 650 mg  650 mg Oral Q6H PRN Agbata, Tochukwu, MD   650 mg at 02/24/21 1511  . acidophilus (RISAQUAD) capsule 1 capsule  1 capsule Oral Daily Agbata, Tochukwu, MD   1 capsule at 02/27/21 0858  . albuterol (VENTOLIN HFA) 108 (90 Base) MCG/ACT inhaler 2 puff  2 puff Inhalation Q6H PRN Agbata, Tochukwu, MD   2 puff at 02/22/21 1020  . cholecalciferol (VITAMIN D3) tablet 1,000 Units  1,000 Units Oral Daily Agbata, Tochukwu, MD   1,000 Units at 02/27/21 0858  . furosemide (LASIX) 200 mg in dextrose 5 % 100 mL (2 mg/mL) infusion  6 mg/hr Intravenous Continuous Ralene Muskrat B, MD 3 mL/hr at 02/26/21 1118 6 mg/hr at 02/26/21 1118  . hydrocortisone (ANUSOL-HC) suppository 25 mg  25 mg Rectal BID Ralene Muskrat B, MD   25 mg at 02/24/21 0905  . insulin aspart (novoLOG) injection 0-20 Units  0-20 Units Subcutaneous TID WC Lorna Dibble, RPH   11 Units at 02/27/21 1303  . insulin glargine (LANTUS) injection 10 Units  10 Units Subcutaneous QHS Wouk, Ailene Rud, MD      . ipratropium-albuterol (DUONEB) 0.5-2.5 (3) MG/3ML  nebulizer solution 3 mL  3 mL Nebulization Q4H PRN Sharion Settler, NP   3 mL at 02/21/21 0811  . iron polysaccharides (NIFEREX) capsule 150 mg  150 mg Oral Daily Agbata, Tochukwu, MD   150 mg at  02/27/21 0901  . magnesium oxide (MAG-OX) tablet 400 mg  400 mg Oral Daily Agbata, Tochukwu, MD   400 mg at 02/27/21 0858  . melatonin tablet 5 mg  5 mg Oral QHS PRN Ralene Muskrat B, MD   5 mg at 02/26/21 2112  . metolazone (ZAROXOLYN) tablet 2.5 mg  2.5 mg Oral Daily Priscella Mann, Sudheer B, MD   2.5 mg at 02/27/21 0901  . multivitamin-lutein (OCUVITE-LUTEIN) capsule 1 capsule  1 capsule Oral Daily Agbata, Tochukwu, MD   1 capsule at 02/27/21 0901  . nicotine (NICODERM CQ - dosed in mg/24 hours) patch 21 mg  21 mg Transdermal Daily Agbata, Tochukwu, MD   21 mg at 02/27/21 0912  . ondansetron (ZOFRAN) tablet 4 mg  4 mg Oral Q6H PRN Agbata, Tochukwu, MD       Or  . ondansetron (ZOFRAN) injection 4 mg  4 mg Intravenous Q6H PRN Agbata, Tochukwu, MD      . pantoprazole (PROTONIX) injection 40 mg  40 mg Intravenous Q12H Wouk, Ailene Rud, MD      . protein supplement (ENSURE MAX) liquid  11 oz Oral BID BM Agbata, Tochukwu, MD      . simvastatin (ZOCOR) tablet 20 mg  20 mg Oral q AM Agbata, Tochukwu, MD   20 mg at 02/27/21 0904  . tiotropium (SPIRIVA) inhalation capsule ([facility] use ONLY) 18 mcg  18 mcg Inhalation Daily Agbata, Tochukwu, MD   18 mcg at 02/27/21 0900  . traZODone (DESYREL) tablet 50 mg  50 mg Oral QHS Agbata, Tochukwu, MD   50 mg at 02/26/21 2112    Allergies as of 02/19/2021 - Review Complete 02/19/2021  Allergen Reaction Noted  . Lactose intolerance (gi)  10/24/2020     Review of Systems:    All systems reviewed and negative except where noted in HPI, with the exception of lower extemity edema/generalized edema, fatigue, malaise, arthralgias.     Physical Exam:  Vital signs in last 24 hours: Temp:  [97.4 F (36.3 C)-98.7 F (37.1 C)] 98.2 F (36.8 C) (04/27 1148) Pulse Rate:  [44-53] 44 (04/27 1148) Resp:  [18-20] 20 (04/27 1148) BP: (125-140)/(50-78) 125/55 (04/27 1148) SpO2:  [95 %-100 %] 100 % (04/27 1148) Weight:  [83.2 kg] 83.2 kg (04/27 0500) Last BM Date:  02/25/21 General:   Pleasant ma in NAD, appears ill Head:  Normocephalic and atraumatic. Eyes:   No icterus.   Conjunctiva pink. Ears:  Normal auditory acuity. Mouth: Mucosa pink moist, no lesions. Neck:  Supple; no masses felt Lungs:  Respirations with regular rhythm, no laboring while resting, does have some mild retractiosn when speaking.  Wearing CPAP while sleeping/[place] 02 at 5l while awake. Lungs with faint bibasilar crackles.   No wheezes, or rhonchi.  Heart:  S1S2, irregular rythm, no MRG. 1+ generalized edema. Abdomen:   Flat, soft, nondistended, nontender. Normal bowel sounds. No appreciable masses or hepatomegaly. No rebound signs or other peritoneal signs. Rectal:  Digital exam not performed- there is some dark blackish green stool on the perianal area..  Msk:  MAEW x4, No clubbing or cyanosis. Strength 4/5. Symmetrical without gross deformities. Neurologic:  Alert and  oriented x4;  Cranial nerves II-XII intact.  Skin:  Warm, dry, sallow, scattered  ecchymosis; dressing to leg and side intact Psych:  Alert and cooperative. Normal affect.  LAB RESULTS: Recent Labs    02/25/21 0531 02/26/21 0003 02/27/21 0558  WBC 5.7 6.3 5.9  HGB 8.5* 8.3* 8.0*  HCT 28.7* 27.0* 26.5*  PLT 254 236 232   BMET Recent Labs    02/25/21 0531 02/26/21 0003 02/27/21 0558  NA  --  136 135  K  --  4.3 3.8  CL  --  98 97*  CO2  --  27 28  GLUCOSE  --  219* 181*  BUN  --  45* 40*  CREATININE 2.42* 2.41* 2.21*  CALCIUM  --  8.2* 8.2*   LFT No results for input(s): PROT, ALBUMIN, AST, ALT, ALKPHOS, BILITOT, BILIDIR, IBILI in the last 72 hours. PT/INR No results for input(s): LABPROT, INR in the last 72 hours.  STUDIES: DG Chest Port 1 View  Result Date: 02/26/2021 CLINICAL DATA:  Hypoxia. EXAM: PORTABLE CHEST 1 VIEW COMPARISON:  02/22/2021.  CT 02/02/2021. FINDINGS: Prior CABG. Cardiomegaly. Diffuse bilateral interstitial prominence consistent with interstitial edema and or pneumonitis.  Small left pleural effusion again noted. No pneumothorax. Chest is unchanged from prior exam. IMPRESSION: Prior CABG. Cardiomegaly. Diffuse bilateral interstitial prominence consistent interstitial edema and or pneumonitis again noted without interim change. Small left pleural effusion again noted without interim change. Electronically Signed   By: Marcello Moores  Register   On: 02/26/2021 07:50       Impression / Plan:   1. Anemia with concerns for melena. Agree with serial hemoclobins prn transfusion and bid Pantoprazole. His black stool may have been related to the iron, declining hgb may be related to gastritis, repeated phlebotomies, antibiotic side effects. His cardiopulmonary status is quite serious= would not recommend EGD presently unless overt bleeding. Will continue to follow with you  Thank you very much for this consult. These services were provided by Stephens November, NP-C, in collaboration with Lesly Rubenstein MD, with whom I have discussed this patient in full.   Stephens November, NP-C

## 2021-02-27 NOTE — Progress Notes (Signed)
Mobility Specialist - Progress Note   02/27/21 1650  Mobility  Activity Repositioned in chair (bed)  Level of Assistance +2 (takes two people)  Assistive Device None  Distance Ambulated (ft) 0 ft  Mobility Response Tolerated well  Mobility performed by Mobility specialist  $Mobility charge 1 Mobility    Pt's supper had just arrived. Pt called out into hallway for mobility specialist to help him reposition for eating. +2 assist for positioning to [facility]. Alarm set.    Kathee Delton Mobility Specialist 02/27/21, 4:52 PM

## 2021-02-27 NOTE — Progress Notes (Signed)
Occupational Therapy Treatment Patient Details Name: [name] MRN: [id number] DOB: April 23, 1946 Today's Date: 02/27/2021    History of present illness [name] is a 75 y.o. 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  Patient states that he has had weakness for the last 3 days which has made it difficult for him to ambulate.  This morning he states he was unable to get off the toilet and actually had a fall this morning.   OT comments  Upon entering the room, pt supine in bed with NT present finishing hygiene assistance after BM. Pt declined OOB therapy and standing tasks. Pt requests , " I want to finish my breakfast. People keep on coming in here." OT provided pt with wash cloth and washes face with set up A. OT provided repositioning in bed with max A to have [place] elevated to finish breakfast. Pt needing set up A of meal. He only takes 1 bite of food while therapist is in the room. OT discussing energy conservation with meals and suggested pt eat several small meals during the day vs 3 large ones. Pt does report fatigue with eating meal. He was pleasant this session but continues to refuse OOB therapeutic intervention.    Follow Up Recommendations  SNF    Equipment Recommendations  3 in 1 bedside commode       Precautions / Restrictions Precautions Precautions: Fall Restrictions Weight Bearing Restrictions: No       Mobility Bed Mobility Overal bed mobility: Needs Assistance Bed Mobility: Rolling Rolling: Mod assist                   Balance       Sitting balance - Comments: Pt declined                                   ADL either performed or assessed with clinical judgement    ADL Overall ADL's : Needs assistance/impaired     Grooming: Wash/dry hands;Wash/dry face;Bed level;Set up;Supervision/safety                                       Vision Patient Visual Report: No change from baseline            Cognition Arousal/Alertness: Awake/alert Behavior During Therapy: WFL for tasks assessed/performed Overall Cognitive Status: Within Functional Limits for tasks assessed                                 General Comments: increased time to respond to questions and initiate tasks.                   Pertinent Vitals/ Pain       Pain Assessment: Faces Faces Pain Scale: No hurt         Frequency  Min 2X/week        Progress Toward Goals  OT Goals(current goals can now be found in the care plan section)  Progress towards OT goals: Progressing toward goals  Acute Rehab OT  Goals Patient Stated Goal: "I want to eat my breakfast" OT Goal Formulation: With patient Time For Goal Achievement: 03/09/21 Potential to Achieve Goals: Good  Plan Discharge plan remains appropriate       AM-PAC OT "6 Clicks" Daily Activity     Outcome Measure   Help from another person eating meals?: A Little Help from another person taking care of personal grooming?: A Little Help from another person toileting, which includes using toliet, bedpan, or urinal?: A Lot Help from another person bathing (including washing, rinsing, drying)?: A Lot Help from another person to put on and taking off regular upper body clothing?: A Lot Help from another person to put on and taking off regular lower body clothing?: A Lot 6 Click Score: 14    End of Session    OT Visit Diagnosis: Other abnormalities of gait and mobility (R26.89);Repeated falls (R29.6);Muscle weakness (generalized) (M62.81)   Activity Tolerance Patient limited by fatigue   Patient Left in bed;with call bell/phone within reach;with bed alarm set   Nurse Communication  Mobility status        Time: 7681-1572 OT Time Calculation (min): 23 min  Charges: OT General Charges $OT Visit: 1 Visit OT Treatments $Self Care/Home Management : 23-37 mins  Darleen Crocker, MS, OTR/L , CBIS ascom [phone]  02/27/21, 12:50 PM

## 2021-02-28 DIAGNOSIS — N179 Acute kidney failure, unspecified: Secondary | ICD-10-CM | POA: Diagnosis not present

## 2021-02-28 LAB — CBC WITH DIFFERENTIAL/PLATELET
Abs Immature Granulocytes: 0.03 10*3/uL (ref 0.00–0.07)
Basophils Absolute: 0 10*3/uL (ref 0.0–0.1)
Basophils Relative: 1 %
Eosinophils Absolute: 0.2 10*3/uL (ref 0.0–0.5)
Eosinophils Relative: 4 %
HCT: 29.4 % — ABNORMAL LOW (ref 39.0–52.0)
Hemoglobin: 9 g/dL — ABNORMAL LOW (ref 13.0–17.0)
Immature Granulocytes: 0 %
Lymphocytes Relative: 17 %
Lymphs Abs: 1.2 10*3/uL (ref 0.7–4.0)
MCH: 24.3 pg — ABNORMAL LOW (ref 26.0–34.0)
MCHC: 30.6 g/dL (ref 30.0–36.0)
MCV: 79.5 fL — ABNORMAL LOW (ref 80.0–100.0)
Monocytes Absolute: 0.7 10*3/uL (ref 0.1–1.0)
Monocytes Relative: 10 %
Neutro Abs: 4.6 10*3/uL (ref 1.7–7.7)
Neutrophils Relative %: 68 %
Platelets: 252 10*3/uL (ref 150–400)
RBC: 3.7 MIL/uL — ABNORMAL LOW (ref 4.22–5.81)
RDW: 19.5 % — ABNORMAL HIGH (ref 11.5–15.5)
WBC: 6.7 10*3/uL (ref 4.0–10.5)
nRBC: 0 % (ref 0.0–0.2)

## 2021-02-28 LAB — BASIC METABOLIC PANEL
Anion gap: 11 (ref 5–15)
BUN: 37 mg/dL — ABNORMAL HIGH (ref 8–23)
CO2: 32 mmol/L (ref 22–32)
Calcium: 8.7 mg/dL — ABNORMAL LOW (ref 8.9–10.3)
Chloride: 93 mmol/L — ABNORMAL LOW (ref 98–111)
Creatinine, Ser: 2.17 mg/dL — ABNORMAL HIGH (ref 0.61–1.24)
GFR, Estimated: 31 mL/min — ABNORMAL LOW (ref >60)
Glucose, Bld: 94 mg/dL (ref 70–99)
Potassium: 3.5 mmol/L (ref 3.5–5.1)
Sodium: 136 mmol/L (ref 135–145)

## 2021-02-28 LAB — GLUCOSE, CAPILLARY
Glucose-Capillary: 102 mg/dL — ABNORMAL HIGH (ref 70–99)
Glucose-Capillary: 221 mg/dL — ABNORMAL HIGH (ref 70–99)
Glucose-Capillary: 229 mg/dL — ABNORMAL HIGH (ref 70–99)
Glucose-Capillary: 225 mg/dL — ABNORMAL HIGH (ref 70–99)

## 2021-02-28 MED ORDER — POTASSIUM CHLORIDE CRYS ER 20 MEQ PO TBCR
20.0000 meq | EXTENDED_RELEASE_TABLET | Freq: Once | ORAL | Status: AC
  Administered 2021-03-01: 20 meq via ORAL
  Filled 2021-02-28: qty 1

## 2021-02-28 MED ORDER — POLYETHYLENE GLYCOL 3350 17 G PO PACK
17.0000 g | PACK | Freq: Every day | ORAL | Status: DC
  Administered 2021-02-28 – 2021-03-02 (×3): 17 g via ORAL
  Filled 2021-02-28 (×3): qty 1

## 2021-02-28 NOTE — Progress Notes (Signed)
GI Inpatient Follow-up Note  Subjective:  Patient seen and stable. Hemoglobin improved from 8 to 9 without intervention.  Scheduled Inpatient Medications:  . sodium chloride   Intravenous Once  . acidophilus  1 capsule Oral Daily  . cholecalciferol  1,000 Units Oral Daily  . hydrocortisone  25 mg Rectal BID  . insulin aspart  0-20 Units Subcutaneous TID WC  . insulin glargine  10 Units Subcutaneous QHS  . magnesium oxide  400 mg Oral Daily  . metolazone  2.5 mg Oral Daily  . multivitamin-lutein  1 capsule Oral Daily  . nicotine  21 mg Transdermal Daily  . pantoprazole (PROTONIX) IV  40 mg Intravenous Q12H  . polyethylene glycol  17 g Oral Daily  . Ensure Max Protein  11 oz Oral BID BM  . simvastatin  20 mg Oral q AM  . tiotropium  18 mcg Inhalation Daily  . traZODone  50 mg Oral QHS    Continuous Inpatient Infusions:   . sodium chloride 250 mL (02/24/21 1235)  . furosemide (LASIX) 200 mg in dextrose 5% 100 mL (2mg /mL) infusion 6 mg/hr (02/27/21 1742)    PRN Inpatient Medications:  sodium chloride, acetaminophen, albuterol, ipratropium-albuterol, melatonin, ondansetron **OR** ondansetron (ZOFRAN) IV  Review of Systems:  Review of Systems  Constitutional: Negative for chills and fever.  Respiratory: Positive for cough and shortness of breath.   Cardiovascular: Negative for chest pain.  Gastrointestinal: Negative for abdominal pain.  Musculoskeletal: Negative for joint pain.  Skin: Negative for itching and rash.  Psychiatric/Behavioral: Positive for substance abuse.  All other systems reviewed and are negative.    Physical Examination: BP (!) 129/54 (BP Location: Right Arm)   Pulse (!) 49   Temp 98.5 F (36.9 C) (Oral)   Resp 16   Ht 5\' 6"  (1.676 m)   Wt 82.8 kg   SpO2 98%   BMI 29.46 kg/m  Gen: Chronically ill appearing HEENT: PEERLA, EOMI, Neck: supple, no JVD or thyromegaly Chest: mild respiratory distress Abd: soft, non-tender, non-distended Ext: no  edema, well perfused with 2+ pulses, Skin: no rash or lesions noted Lymph: no LAD  Data: Lab Results  Component Value Date   WBC 6.7 02/28/2021   HGB 9.0 (L) 02/28/2021   HCT 29.4 (L) 02/28/2021   MCV 79.5 (L) 02/28/2021   PLT 252 02/28/2021   Recent Labs  Lab 02/26/21 0003 02/27/21 0558 02/28/21 0559  HGB 8.3* 8.0* 9.0*   Lab Results  Component Value Date   NA 136 02/28/2021   K 3.5 02/28/2021   CL 93 (L) 02/28/2021   CO2 32 02/28/2021   BUN 37 (H) 02/28/2021   CREATININE 2.17 (H) 02/28/2021   Lab Results  Component Value Date   ALT 12 02/19/2021   AST 15 02/19/2021   ALKPHOS 102 02/19/2021   BILITOT 0.7 02/19/2021   No results for input(s): APTT, INR, PTT in the last 168 hours. Assessment/Plan: 75 y/o gentleman with COPD and heart failure who presented with volume overload and currently getting diuresed with lasix drip. Consulted for anemia with dark stools but stools green. Hemoglobin stable to improved  Recommendations:  - agree with holding eliquis  - PPI IV BID for total of 72 hours and then can switch to PO daily - transfuse prn to keep hemoglobin > 8  - will not plan on EGD at this time due to high oxygen requirement and ongoing treatment for heart failure. If develops overt GI bleeding then can be readdressed  -  would strongly consider palliative care consult given multiple recent hospitalizations  - will follow peripherally, please call with any questions or concerns    Raylene Miyamoto MD, MPH  [place]

## 2021-02-28 NOTE — Care Management Important Message (Signed)
Important Message  Patient Details  Name: [name] MRN: [id number] Date of Birth: Mar 21, 1946   Medicare Important Message Given:  Yes     Dannette Barbara 02/28/2021, 1:01 PM

## 2021-02-28 NOTE — Progress Notes (Signed)
[facility] Cardiology  Patient Description: [name] is a 75 year old male with PMH significant for CAD s/p CABG (2006), h/o HFrEF (most recent EF = 50-55%), paroxysmal atrial fibrillation (on Eliquis), h/o aortic valve stenosis, h/o CVA, CKD, DM type II, hyperlipidemia, hypertension, COPD and tobacco use who was admitted due to acute CHF exacerbation and AKI.  SUBJECTIVE: The patient reports to be doing well on today and states that his dyspnea has slightly improved. He denies having any other cardiac symptoms at this time. The patient states that he has been able to get out of bed to the chair will assistance but states that he continues to feel "weak." He denies having any dizziness or lightheadedness.   02/27/2021: UTA.   02/26/2021: The patient reports to be feeling slightly better on today. He reports that his dyspnea remains unchanged and he continues to deny having any chest pain, dizziness or palpitations at this time.   02/25/21: The patient reports to be doing "okay" on today and he denies having any chest pain at this time. He states that he continues to have dyspnea that remains unchanged since his admission.   02/22/2021: The patient reports to be doing " so, so" on today and states that he is having a hard time breathing with the CPAP machine.  He states that it "feels as if I am not getting enough air" on the CPAP machine. The patient's oxygen saturation is at 98%, respirations are 16 and he has a slightly increased effort of breathing at this time.  The patient denies having any chest pain, palpitations, peripheral edema or dizziness at this time. However, the patient does report tenderness to the BLE upon palpation.   OBJECTIVE: The patient appears slightly better on today, he continues to be on supplemental oxygen via [place] as well as the lasix gtt at 6mg /hr. The patient is not any in distress and he has normal effort of breathing. The patient's BLE has +1 pitting edema and he has bilateral SCD's  in place. Urine output continues to be adequate. The patient continues to be in atrial fibrillation with slow ventricular response but his HR has improved to the high 50's to low 60's bpm range. He continues to be asymptomatic.   02/27/2021: The patient is sleeping comfortably with CPAP machine on. He is Alert and woke up when he heard his name called and fell back to sleep shortly afterwards. The patient appears to be better on today, he appears euvolemic and he does not appear to be in any distress. The patient continues to be in atrial fibrillation with slow ventricular response as exemplified on the telemetry monitor. The patient's foley catheter remains in place and he appears to have adeqaute urine output.    02/26/21:The patient appears much better on today as he is more interactive and smiling. He continues to be in atrial fibrilation with slow ventricular response as noted by the bedside telemetry monitoring. The patient continues to have vascular congestion, despite the decrease in his peripheral edema. He continues to be on a IV Lasix drip which has been increased by nephrology to 6 mg/hr and he has also been placed on metalazone 2.5mg  daily. The patient has good urinary output at this time and the indwelling catheter remains in place. The patient appears fair on today, his vss and he is in no apparent distress.  The patient continues to be on supplemental oxygen via nasal cannula and utilizes CPAP machine at night.   02/25/21: The patient is sitting  comfortably in bed eating lunch. He is now on supplemental oxygen via nasal cannula with oxygen saturations that are within normal range.  The patient continues to be in atrial fibrillation with a slow ventricular response in the high 40s.  The patient continues to remain asymptomatic.  The patient continues to be on a furosemide drip due to hypervolemia. Upon examination he appears to having +1 pitting edema in the BLE. The patient appears acutely ill,  his other vital signs are stable and he is in no apparent distress.  02/22/2021: The patient appears chronically ill on today, he continues to be in fibrillation with slow ventricular response and a HR between 38-69 bpm as noted by the bedside telemetry monitor.  The patient continues to remain asymptomatic and all of his other vital signs are stable. The patient's hemoglobin on today is 7.0 and he will be undergoing a blood transfusion proceeded by IV lasix. The patient appears euvolemic on today and has minimal, nonpitting BLE edema.  Vitals:   02/28/21 0750 02/28/21 0915 02/28/21 1141 02/28/21 1558  BP: (!) 156/72  (!) 129/54 (!) 127/50  Pulse: 66  (!) 49 (!) 42  Resp: 17  16 16   Temp: 97.9 F (36.6 C)  98.5 F (36.9 C) 98.3 F (36.8 C)  TempSrc:   Oral Oral  SpO2: 100%  98% 99%  Weight:  82.8 kg    Height:         Intake/Output Summary (Last 24 hours) at 02/28/2021 1637 Last data filed at 02/28/2021 1350 Gross per 24 hour  Intake 562.75 ml  Output 4100 ml  Net -3537.25 ml      PHYSICAL EXAM  General: Well developed, well nourished, in no acute distress HEENT:  Normocephalic and atraumatic. PERRL Neck:  No JVD.  Lungs: clear bilaterally to auscultation.  Chest expansion symmetrical, normal effort of breathing, negative for rhonchi, crackles or rales Heart: Irregular heart rate and rhythm. Normal S1 and S2 without gallops or murmurs.  Abdomen: Bowel sounds are positive, abdomen soft and non-tender  Msk:  Normal strength and tone for age. Extremities: +1 pitting BLE edema.  No clubbing or cyanosis Neuro: Alert and oriented X 3. Psych:  Good affect, responds appropriately   LABS: Basic Metabolic Panel: Recent Labs    02/27/21 0558 02/28/21 0559  NA 135 136  K 3.8 3.5  CL 97* 93*  CO2 28 32  GLUCOSE 181* 94  BUN 40* 37*  CREATININE 2.21* 2.17*  CALCIUM 8.2* 8.7*   Liver Function Tests: No results for input(s): AST, ALT, ALKPHOS, BILITOT, PROT, ALBUMIN in the  last 72 hours. No results for input(s): LIPASE, AMYLASE in the last 72 hours. CBC: Recent Labs    02/27/21 0558 02/28/21 0559  WBC 5.9 6.7  NEUTROABS 4.1 4.6  HGB 8.0* 9.0*  HCT 26.5* 29.4*  MCV 79.1* 79.5*  PLT 232 252   Cardiac Enzymes: No results for input(s): CKTOTAL, CKMB, CKMBINDEX, TROPONINI in the last 72 hours. BNP: Invalid input(s): POCBNP D-Dimer: No results for input(s): DDIMER in the last 72 hours. Hemoglobin A1C: No results for input(s): HGBA1C in the last 72 hours. Fasting Lipid Panel: No results for input(s): CHOL, HDL, LDLCALC, TRIG, CHOLHDL, LDLDIRECT in the last 72 hours. Thyroid Function Tests: No results for input(s): TSH, T4TOTAL, T3FREE, THYROIDAB in the last 72 hours.  Invalid input(s): FREET3 Anemia Panel: No results for input(s): VITAMINB12, FOLATE, FERRITIN, TIBC, IRON, RETICCTPCT in the last 72 hours.  No results found.   Echo: (  11/15/2020)  IMPRESSIONS  1. Left ventricular ejection fraction, by estimation, is 50 to 55%. The  left ventricle has low normal function. The left ventricle has no regional  wall motion abnormalities. Left ventricular diastolic parameters were  normal.  2. Right ventricular systolic function is normal. The right ventricular  size is mildly enlarged.  3. The mitral valve was not well visualized. Mild mitral valve  regurgitation.  4. The aortic valve is calcified. Aortic valve regurgitation is trivial.  5. Aortic dilatation noted.   TELEMETRY: Atrial fibrillation with slow ventricular response  ASSESSMENT AND PLAN:  Principal Problem:   AKI (acute kidney injury) ([place]) Active Problems:   Chronic kidney disease (CKD) stage G3b/A3, moderately decreased glomerular filtration rate (GFR) between 30-44 mL/min/1.73 square meter and albuminuria creatinine ratio greater than 300 mg/g (HCC)   History of CVA (cerebrovascular accident)   Chronic diastolic CHF (congestive heart failure) (HCC)   Atrial fibrillation,  chronic (HCC)   Type 2 diabetes mellitus with hypoglycemia without coma (HCC)   Bradycardia   Weakness generalized   Dental infection   Pressure injury of skin    1. Acute on Chronic diastolic HF, reasonably stable  -Gentle diuresis with IV lasix gtt which has been increased to 6mg /hr by Nephrology.  Transition to oral lasix once successfully diuresed and euvolemic. Nephrology input appreciated.  -Avoid AV nodal blocking agents at this time due to slow ventricular response.  -Recommend following a low-sodium diet, performing daily weights and monitoring strict I's and O's.  2. Atrial fibrillation with slow ventricular response, reasonably stable  -We will continue anticoagulation with Eliquis.  -Recommend following bleeding precautions.  -Rate control not indicated at this time in the presence of slow ventricular response.   -Recommend continuous telemetry monitoring until discharge.  3. CAD s/p CABG, stable, not exacerbated at this time  -Recommend continuing statin therapy.  -Recommend following heart healthy diet.  4. AKI, fairly stable with improvement in creatinine  -Nephrology consult appreciated.  5. Severe anemia s/p blood transfusion of 02/22/21, patient's hemoglobin level today is 8.5>>8.3>>8.0>>9.0  -Agree with current management.  6.  COPD with acute on chronic respiratory failure, fairly stable, patient is having dyspnea at this time  -Agree with current therapy with supplemental oxygen via nasal cannula and CPAP use while asleep.   -RT evaluation and treatment greatly appreciated.  -Recommend PT/OT evaluation.   7. HTN, reasonably controlled, patient normotensive on today  -Recommend continuing to follow a low-sodium diet.  8. HLD  -Recommend continuing statin therapy.     [place], [id number]  02/28/2021 4:37 PM

## 2021-02-28 NOTE — Progress Notes (Addendum)
[facility]  ROUNDING NOTE   Subjective:   [name] is a 75 y.o.male with past medical history of chronic Atrial Fibrillation, CHF, diabetes, CKD stage 3, GERD and hypertension. He presents to the ED with generalized weakness for 3 days.   We were consulted for evaluate of acute kidney injury. Baseline Creatinine of 1.95, BUN 70, and EGFR 15 on admission.   Patient seen laying in bed Alert and oriented Tolerating meals Currently on 5L [place] Says his current breathing status now is about what it is at home   Objective:  Vital signs in last 24 hours:  Temp:  [97.7 F (36.5 C)-98.5 F (36.9 C)] 98.5 F (36.9 C) (04/28 1141) Pulse Rate:  [45-66] 49 (04/28 1141) Resp:  [16-22] 16 (04/28 1141) BP: (129-156)/(54-72) 129/54 (04/28 1141) SpO2:  [96 %-100 %] 98 % (04/28 1141) Weight:  [82.8 kg] 82.8 kg (04/28 0915)  Weight change:  Filed Weights   02/25/21 0326 02/27/21 0500 02/28/21 0915  Weight: 82.7 kg 83.2 kg 82.8 kg    Intake/Output: I/O last 3 completed shifts: In: 82.8 [I.V.:82.8] Out: 3000 [Urine:3000]   Intake/Output this shift:  Total I/O In: 480 [P.O.:480] Out: 1000 [Urine:1000]  Physical Exam: General: NAD  Head: Normocephalic, atraumatic. Moist oral mucosal membranes  Eyes: Anicteric  Lungs:  Fine Crackles in left base,O2 5L [place]  Heart: Regular rate and rhythm  Abdomen:  Soft, nontender,   Extremities:  no peripheral edema.  Neurologic: Nonfocal, moving all four extremities  Skin: No lesions  GU Foley    Basic Metabolic Panel: Recent Labs  Lab 02/23/21 0531 02/23/21 1606 02/24/21 0419 02/25/21 0531 02/26/21 0003 02/27/21 0558 02/28/21 0559  NA 138  --  138  --  136 135 136  K 4.7  --  4.3  --  4.3 3.8 3.5  CL 101  --  102  --  98 97* 93*  CO2 23  --  25  --  27 28 32  GLUCOSE 133*  --  203*  --  219* 181* 94  BUN 56*  --  53*  --  45* 40* 37*  CREATININE 2.95*  --  2.53* 2.42* 2.41* 2.21* 2.17*  CALCIUM 8.7*  --  8.4*  --  8.2*  8.2* 8.7*  PHOS  --  4.2  --   --   --   --   --     Liver Function Tests: No results for input(s): AST, ALT, ALKPHOS, BILITOT, PROT, ALBUMIN in the last 168 hours. No results for input(s): LIPASE, AMYLASE in the last 168 hours. No results for input(s): AMMONIA in the last 168 hours.  CBC: Recent Labs  Lab 02/24/21 0419 02/25/21 0531 02/26/21 0003 02/27/21 0558 02/28/21 0559  WBC 7.4 5.7 6.3 5.9 6.7  NEUTROABS 5.4 3.9 4.7 4.1 4.6  HGB 9.0* 8.5* 8.3* 8.0* 9.0*  HCT 29.7* 28.7* 27.0* 26.5* 29.4*  MCV 79.2* 79.7* 79.2* 79.1* 79.5*  PLT 264 254 236 232 252    Cardiac Enzymes: No results for input(s): CKTOTAL, CKMB, CKMBINDEX, TROPONINI in the last 168 hours.  BNP: Invalid input(s): POCBNP  CBG: Recent Labs  Lab 02/27/21 1149 02/27/21 1639 02/27/21 2032 02/28/21 0748 02/28/21 1141  GLUCAP 259* 218* 176* 102* 221*    Microbiology: Results for orders placed or performed during the hospital encounter of 02/19/21  Resp Panel by RT-PCR (Flu A&B, Covid) Nasopharyngeal Swab     Status: None   Collection Time: 02/19/21  9:56 AM  Specimen: Nasopharyngeal Swab; Nasopharyngeal(NP) swabs in vial transport medium  Result Value Ref Range Status   SARS Coronavirus 2 by RT PCR NEGATIVE NEGATIVE Final    Comment: (NOTE) SARS-CoV-2 target nucleic acids are NOT DETECTED.  The SARS-CoV-2 RNA is generally detectable in upper respiratory specimens during the acute phase of infection. The lowest concentration of SARS-CoV-2 viral copies this assay can detect is 138 copies/mL. A negative result does not preclude SARS-Cov-2 infection and should not be used as the sole basis for treatment or other patient management decisions. A negative result may occur with  improper specimen collection/handling, submission of specimen other than nasopharyngeal swab, presence of viral mutation(s) within the areas targeted by this assay, and inadequate number of viral copies(<138 copies/mL). A negative  result must be combined with clinical observations, patient history, and epidemiological information. The expected result is Negative.  Fact Sheet for Patients:  EntrepreneurPulse.com.au  Fact Sheet for Healthcare Providers:  IncredibleEmployment.be  This test is no t yet approved or cleared by the [place] FDA and  has been authorized for detection and/or diagnosis of SARS-CoV-2 by FDA under an Emergency Use Authorization (EUA). This EUA will remain  in effect (meaning this test can be used) for the duration of the COVID-19 declaration under Section 564(b)(1) of the Act, 21 U.S.C.section 360bbb-3(b)(1), unless the authorization is terminated  or revoked sooner.       Influenza A by PCR NEGATIVE NEGATIVE Final   Influenza B by PCR NEGATIVE NEGATIVE Final    Comment: (NOTE) The Xpert Xpress SARS-CoV-2/FLU/RSV plus assay is intended as an aid in the diagnosis of influenza from Nasopharyngeal swab specimens and should not be used as a sole basis for treatment. Nasal washings and aspirates are unacceptable for Xpert Xpress SARS-CoV-2/FLU/RSV testing.  Fact Sheet for Patients: EntrepreneurPulse.com.au  Fact Sheet for Healthcare Providers: IncredibleEmployment.be  This test is not yet approved or cleared by the [place] FDA and has been authorized for detection and/or diagnosis of SARS-CoV-2 by FDA under an Emergency Use Authorization (EUA). This EUA will remain in effect (meaning this test can be used) for the duration of the COVID-19 declaration under Section 564(b)(1) of the Act, 21 U.S.C. section 360bbb-3(b)(1), unless the authorization is terminated or revoked.  Performed at [facility], [address] [place], [place] [address]     Coagulation Studies: No results for input(s): LABPROT, INR in the last 72 hours.  Urinalysis: No results for input(s): COLORURINE, LABSPEC,  PHURINE, GLUCOSEU, HGBUR, BILIRUBINUR, KETONESUR, PROTEINUR, UROBILINOGEN, NITRITE, LEUKOCYTESUR in the last 72 hours.  Invalid input(s): APPERANCEUR    Imaging: No results found.   Medications:   . sodium chloride 250 mL (02/24/21 1235)  . furosemide (LASIX) 200 mg in dextrose 5% 100 mL (45m/mL) infusion 6 mg/hr (02/27/21 1742)   . sodium chloride   Intravenous Once  . acidophilus  1 capsule Oral Daily  . cholecalciferol  1,000 Units Oral Daily  . hydrocortisone  25 mg Rectal BID  . insulin aspart  0-20 Units Subcutaneous TID WC  . insulin glargine  10 Units Subcutaneous QHS  . magnesium oxide  400 mg Oral Daily  . metolazone  2.5 mg Oral Daily  . multivitamin-lutein  1 capsule Oral Daily  . nicotine  21 mg Transdermal Daily  . pantoprazole (PROTONIX) IV  40 mg Intravenous Q12H  . polyethylene glycol  17 g Oral Daily  . Ensure Max Protein  11 oz Oral BID BM  . simvastatin  20 mg Oral  q AM  . tiotropium  18 mcg Inhalation Daily  . traZODone  50 mg Oral QHS   sodium chloride, acetaminophen, albuterol, ipratropium-albuterol, melatonin, ondansetron **OR** ondansetron (ZOFRAN) IV    Renal U/s  FINDINGS: Right Kidney:   Renal measurements: 9.8 x 4.5 x 4.0 cm = volume: 91.2 mL. Mild increased echogenicity. No mass or hydronephrosis visualized.   Left Kidney:   Renal measurements: 9.4 x 5.3 x 5.1 cm = volume: 132.8 mL. Mild increased echogenicity. No mass or hydronephrosis visualized.   Bladder:   Mild bladder distention cannot be excluded.   Other:   Small right pleural effusion cannot be excluded.   IMPRESSION: 1. Mild increased echogenicity both kidneys suggesting chronic medical renal disease. No acute renal abnormality identified.   2.  Bladder distention cannot be excluded.   3.  Small right pleural effusion cannot be excluded.     Assessment/ Plan:  [name] is a 75 y.o.  male with past medical history of chronic Atrial Fibrillation, CHF,  diabetes, CKD stage 3, GERD and hypertension. He presents to the ED with generalized weakness for 3 days.   1) AKI secondary to ATN  CKD risk factors include diabetes, hypertension, age, atherosclerosis.  Increased echogenicity noted in both kidneys. Lab Results  Component Value Date   CREATININE 2.17 (H) 02/28/2021   CREATININE 2.21 (H) 02/27/2021   CREATININE 2.41 (H) 02/26/2021    Renal function improving Urine output 3L   2) volume overload Lasix drip 7m/hr until tomorrow   3)Anemia of chronic disease and iron deficiency anemia  CBC Latest Ref Rng & Units 02/28/2021 02/27/2021 02/26/2021  WBC 4.0 - 10.5 K/uL 6.7 5.9 6.3  Hemoglobin 13.0 - 17.0 g/dL 9.0(L) 8.0(L) 8.3(L)  Hematocrit 39.0 - 52.0 % 29.4(L) 26.5(L) 27.0(L)  Platelets 150 - 400 K/uL 252 232 236    Results for RJAYLEON, MCFARLANE(MRN [id number] as of 02/23/2021 15:43  Ref. Range 01/28/2021 05:43  Iron Latest Ref Range: 45 - 182 ug/dL 25 (L)  UIBC Latest Units: ug/dL 431  TIBC Latest Ref Range: 250 - 450 ug/dL 456 (H)  Saturation Ratios Latest Ref Range: 17.9 - 39.5 % 6 (L)   Patient did receive PRBC during this admission Will continue to monitor       LOS: 9 Shantelle Breeze 4/28/202212:22 PM [facility]  Patient was seen and evaluated with SColon Flattery NP.  Plan of care was discussed with patient as well as NP.  I agree with the note as documented with edits.

## 2021-02-28 NOTE — Progress Notes (Signed)
PT Cancellation Note  Patient Details Name: [name] MRN: [id number] DOB: Jul 19, 1946   Cancelled Treatment:    Reason Eval/Treat Not Completed: Patient declined, no reason specified Attempted to see pt this afternoon.  He had just recently gotten his meal and was not at all interested in stopping eating to work with PT. He waved me off and said I'm not doing that today.  Will maintain on caseload and see as appropriate.  Kreg Shropshire, DPT 02/28/2021, 5:51 PM

## 2021-02-28 NOTE — TOC Progression Note (Signed)
Transition of Care [facility]) - Progression Note    Patient Details  Name: [name] MRN: [id number] Date of Birth: 03/26/1946  [facility]) CM/SW [place], RN Phone Number: 02/28/2021, 12:13 PM  Clinical Narrative:  Called and spoke with wife [name] about SNF acceptance choice. Wife states she prefers [facility] [place] and she also discussed rehabilitation with patient who agrees to proceed. Called [facility], spoke with [facility] Admissions Coordinator who confirms bed acceptance. Attending notified and says patient may be ready to go tomorrow, 03/01/21. Called [facility], spoke with Liliane Channel and informed him that discharge may occur tomorrow, Liliane Channel voices understanding.     Expected Discharge Plan: [place] Barriers to Discharge: Continued Medical Work up  Expected Discharge Plan and Services Expected Discharge Plan: [place] In-house Referral: Clinical Social Work     Living arrangements for the past 2 months: Single Family Home                 DME Arranged: N/A DME Agency: NA       HH Arranged: NA [place] Agency: NA         Social Determinants of Health (SDOH) Interventions    Readmission Risk Interventions Readmission Risk Prevention Plan 02/04/2021 10/28/2020 12/27/2019  Transportation Screening Complete Complete Complete  PCP or Specialist Appt within 3-5 Days - - Complete  HRI or [place] - - Complete  Medication Review (RN Care Manager) Complete Complete Complete  PCP or Specialist appointment within 3-5 days of discharge Complete Complete -  [place] or Home Care Consult Complete Complete -  SW Recovery Care/Counseling Consult Complete - -  Palliative Care Screening Not Applicable Not Applicable -  [place] Not Applicable Not Applicable -  Some recent data might be hidden

## 2021-02-28 NOTE — Progress Notes (Signed)
PROGRESS NOTE    [name]  TGG:[id number] DOB: 01-24-1946 DOA: 02/19/2021 PCP: [name], MD   Brief Narrative:  [name] is a 75 y.o. 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 Patient states that he has had weakness for the last 3 days which has made it difficult for him to ambulate.  This morning he states he was unable to get off the toilet and actually had a fall this morning. He denies feeling dizzy or lightheaded and complains of just generalized weakness. He denies having any chest pain, no shortness of breath, no fever, no chills, no headache, no abdominal pain, no nausea, no vomiting, no diarrhea, no urinary symptoms, no blurred vision, no difficulty swallowing, no headache.  Found to be bradycardic, hypoglycemic, borderline hypotensive.  Found to have significant AKI thought to be due to overdiuresis.  Case discussed with cardiology and nephrology.  Recommendations appreciated.  4/21: This morning patient was complaining of worsening shortness of breath.  Stat chest x-ray demonstrates fluid overload.  Administered 60 mg IV Lasix.  CPAP ordered.  4/22: Patient still endorsing shortness of breath although improved from prior.  Tolerated BiPAP okay.  Does better with it on. 4/23: On Lasix infusion.  Continues to require CPAP. 4/24: Slowly improving.  Alternate between 4 L nasal cannula and CPAP. remains on Lasix drip 4/26: Dyspnea and fatigue persistent.  Patient had -5.7 L however chest x-ray today continues to demonstrate vascular congestion.  Patient alternating between 4 L nasal cannula and CPAP.  Case discussed with nephrology, Lasix GTT increased to 6 mg/h and added metolazone 2.5 mg daily 4/27:  stable, continues lasix gtt 4/28: stable, continuing lasix gtt    Assessment & Plan:   Principal Problem:   AKI (acute kidney injury) ([place]) Active Problems:   Chronic kidney disease (CKD) stage G3b/A3, moderately decreased glomerular filtration rate (GFR) between 30-44 mL/min/1.73 square meter and albuminuria creatinine ratio greater than 300 mg/g (HCC)   History of CVA (cerebrovascular accident)   Chronic diastolic CHF (congestive heart failure) (HCC)   Atrial fibrillation, chronic (HCC)   Type 2 diabetes mellitus with hypoglycemia without coma (HCC)   Bradycardia   Weakness generalized   Dental infection   Pressure injury of skin  Acute kidney injury on chronic kidney disease stage IIIb  Acute tubular necrosis Developed edema on chest x-ray At baseline patient has a serum creatinine of 1.95, on admission his creatinine is 3.95 Downtrending, 2.17 today. uop 3 L yesterday Nephrology consulted, have been following for course of admission. Normal EF and diastolic parameters on January 2022. Plan: No IV fluids Cont Lasix GTT Added metolazone 2.5 mg daily Careful monitoring of volume status Daily BMP  Acute on chronic anemia Dark stools Hb drifting down to 7 on 4/22. Received one unit. Slow downtrend since then but today stable with hgb of 9. Report of melanotic stool yesterday with hgb of 8. GI consulted - eliquis paused - protonix IV bid started - po iron held - no plan for intervention currently, will monitor   Atrial fibrillation with slow ventricular response Chronic issue per Dr. Ubaldo Glassing who the patient is well-known to Plan: Avoid AV nodal blocking agents - holding apixaban as above  Diabetes mellitus with complications of stage III  chronic kidney disease Euglycemic - re-started home lantus at lower dose of 10 - Started SSI  COPD with acute on chronic respiratory failure Not acutely exacerbated. Baseline 2 L, here requiring 5 with signs volume overload on CXR -  Continue Spiriva, as needed bronchodilator therapy and inhaled steroids - Wean oxygen as tolerated - Home CPAP when sleeping  Acute on chronic diastolic congestive heart failure Diuresis Lasix GTT  Holding home diuretics  Coronary artery disease Stable and not acutely exacerbated Continue statins and aspirin Hold nitrates  Generalized weakness/fall  Multifactorial and related to bradycardia as well as hypotension Place patient on fall precautions PT/OT advising SNF  Chronic pain - will hold gabapentin/oxycodone given signs sedation  Dental infection Patient noted to have purulence around his gum line on admission Cervical spine CT shows an expansile lucent lesion with pathologic fracture  identified involving the midline the maxilla. Recommend further evaluation with dedicated maxillofacial CT with contrast. Discussed w/ ENT Richardson Landry on 4/27, think ok to get CT when kidney function allows but says likely this is a dental problem, would need f/u with dental. S/p 8 days unasyn - consider CT maxillofacial if/when kidney function allows  DVT prophylaxis: SCDs Code Status: Full Family Communication:  Wife updated telephonically 4/27 Disposition Plan: Status is: Inpatient  Remains inpatient appropriate because:Inpatient level of care appropriate due to severity of illness   Dispo: The patient is from: Home              Anticipated d/c is to: SNF (has bed so should be able to discharge when stable)              Patient currently is not medically stable to d/c.   Difficult to place patient No   Level of care: Progressive Cardiac  Consultants:  Nephrology Cardiology GI   Procedures:None  Antimicrobials:   Unasyn   Subjective: Patient seen and examined.  Continues to be fatigued, this is unchanged.  Breathing is stable. Has appetite.  Objective: Vitals:   02/28/21 0216 02/28/21 0418 02/28/21 0750 02/28/21 0915  BP:  (!) 146/56 (!) 156/72   Pulse: (!) 50 (!) 59  66   Resp:  (!) 22 17   Temp:  97.7 F (36.5 C) 97.9 F (36.6 C)   TempSrc:  Oral    SpO2: 96% 100% 100%   Weight:    82.8 kg  Height:        Intake/Output Summary (Last 24 hours) at 02/28/2021 0943 Last data filed at 02/28/2021 0915 Gross per 24 hour  Intake 562.75 ml  Output 3200 ml  Net -2637.25 ml   Filed Weights   02/25/21 0326 02/27/21 0500 02/28/21 0915  Weight: 82.7 kg 83.2 kg 82.8 kg    Examination:  General exam: No acute distress Respiratory system: Scattered crackles bilaterally.  Normal work of breathing.  5 L cardiovascular system: S1-S2, regular rate and rhythm, no murmurs, 1+ pedal edema  gastrointestinal system: Abdomen is nondistended, soft and nontender. No organomegaly or masses felt. Normal bowel sounds heard. Central nervous system: Alert, oriented x2, no focal deficits, lethargic extremities: Symmetric 5 x 5 power. Skin: No rashes, lesions or ulcers Psychiatry: Judgement and insight appear impaired. Mood & affect flattened.     Data Reviewed: I have personally reviewed following labs and imaging studies  CBC: Recent Labs  Lab 02/24/21 0419 02/25/21 0531 02/26/21 0003 02/27/21 0558 02/28/21 0559  WBC 7.4 5.7 6.3 5.9 6.7  NEUTROABS 5.4 3.9 4.7 4.1 4.6  HGB  9.0* 8.5* 8.3* 8.0* 9.0*  HCT 29.7* 28.7* 27.0* 26.5* 29.4*  MCV 79.2* 79.7* 79.2* 79.1* 79.5*  PLT 264 254 236 232 518   Basic Metabolic Panel: Recent Labs  Lab 02/23/21 0531 02/23/21 1606 02/24/21 0419 02/25/21 0531 02/26/21 0003 02/27/21 0558 02/28/21 0559  NA 138  --  138  --  136 135 136  K 4.7  --  4.3  --  4.3 3.8 3.5  CL 101  --  102  --  98 97* 93*  CO2 23  --  25  --  27 28 32  GLUCOSE 133*  --  203*  --  219* 181* 94  BUN 56*  --  53*  --  45* 40* 37*  CREATININE 2.95*  --  2.53* 2.42* 2.41* 2.21* 2.17*  CALCIUM 8.7*  --  8.4*  --  8.2* 8.2* 8.7*  PHOS  --  4.2  --   --   --   --   --    GFR: Estimated Creatinine Clearance: 29.7 mL/min (A) (by C-G formula based  on SCr of 2.17 mg/dL (H)). Liver Function Tests: No results for input(s): AST, ALT, ALKPHOS, BILITOT, PROT, ALBUMIN in the last 168 hours. No results for input(s): LIPASE, AMYLASE in the last 168 hours. No results for input(s): AMMONIA in the last 168 hours. Coagulation Profile: No results for input(s): INR, PROTIME in the last 168 hours. Cardiac Enzymes: No results for input(s): CKTOTAL, CKMB, CKMBINDEX, TROPONINI in the last 168 hours. BNP (last 3 results) No results for input(s): PROBNP in the last 8760 hours. HbA1C: No results for input(s): HGBA1C in the last 72 hours. CBG: Recent Labs  Lab 02/27/21 0738 02/27/21 1149 02/27/21 1639 02/27/21 2032 02/28/21 0748  GLUCAP 175* 259* 218* 176* 102*   Lipid Profile: No results for input(s): CHOL, HDL, LDLCALC, TRIG, CHOLHDL, LDLDIRECT in the last 72 hours. Thyroid Function Tests: No results for input(s): TSH, T4TOTAL, FREET4, T3FREE, THYROIDAB in the last 72 hours. Anemia Panel: No results for input(s): VITAMINB12, FOLATE, FERRITIN, TIBC, IRON, RETICCTPCT in the last 72 hours. Sepsis Labs: No results for input(s): PROCALCITON, LATICACIDVEN in the last 168 hours.  Recent Results (from the past 240 hour(s))  Resp Panel by RT-PCR (Flu A&B, Covid) Nasopharyngeal Swab     Status: None   Collection Time: 02/19/21  9:56 AM   Specimen: Nasopharyngeal Swab; Nasopharyngeal(NP) swabs in vial transport medium  Result Value Ref Range Status   SARS Coronavirus 2 by RT PCR NEGATIVE NEGATIVE Final    Comment: (NOTE) SARS-CoV-2 target nucleic acids are NOT DETECTED.  The SARS-CoV-2 RNA is generally detectable in upper respiratory specimens during the acute phase of infection. The lowest concentration of SARS-CoV-2 viral copies this assay can detect is 138 copies/mL. A negative result does not preclude SARS-Cov-2 infection and should not be used as the sole basis for treatment or other patient management decisions. A negative result may occur  with  improper specimen collection/handling, submission of specimen other than nasopharyngeal swab, presence of viral mutation(s) within the areas targeted by this assay, and inadequate number of viral copies(<138 copies/mL). A negative result must be combined with clinical observations, patient history, and epidemiological information. The expected result is Negative.  Fact Sheet for Patients:  EntrepreneurPulse.com.au  Fact Sheet for Healthcare Providers:  IncredibleEmployment.be  This test is no t yet approved or cleared by the [place] FDA and  has been authorized for detection and/or diagnosis of SARS-CoV-2 by FDA under an Emergency  Use Authorization (EUA). This EUA will remain  in effect (meaning this test can be used) for the duration of the COVID-19 declaration under Section 564(b)(1) of the Act, 21 U.S.C.section 360bbb-3(b)(1), unless the authorization is terminated  or revoked sooner.       Influenza A by PCR NEGATIVE NEGATIVE Final   Influenza B by PCR NEGATIVE NEGATIVE Final    Comment: (NOTE) The Xpert Xpress SARS-CoV-2/FLU/RSV plus assay is intended as an aid in the diagnosis of influenza from Nasopharyngeal swab specimens and should not be used as a sole basis for treatment. Nasal washings and aspirates are unacceptable for Xpert Xpress SARS-CoV-2/FLU/RSV testing.  Fact Sheet for Patients: EntrepreneurPulse.com.au  Fact Sheet for Healthcare Providers: IncredibleEmployment.be  This test is not yet approved or cleared by the [place] FDA and has been authorized for detection and/or diagnosis of SARS-CoV-2 by FDA under an Emergency Use Authorization (EUA). This EUA will remain in effect (meaning this test can be used) for the duration of the COVID-19 declaration under Section 564(b)(1) of the Act, 21 U.S.C. section 360bbb-3(b)(1), unless the authorization is terminated  or revoked.  Performed at [facility], [address] [place], [place] [address]          Radiology Studies: No results found.      Scheduled Meds: . sodium chloride   Intravenous Once  . acidophilus  1 capsule Oral Daily  . cholecalciferol  1,000 Units Oral Daily  . hydrocortisone  25 mg Rectal BID  . insulin aspart  0-20 Units Subcutaneous TID WC  . insulin glargine  10 Units Subcutaneous QHS  . iron polysaccharides  150 mg Oral Daily  . magnesium oxide  400 mg Oral Daily  . metolazone  2.5 mg Oral Daily  . multivitamin-lutein  1 capsule Oral Daily  . nicotine  21 mg Transdermal Daily  . pantoprazole (PROTONIX) IV  40 mg Intravenous Q12H  . Ensure Max Protein  11 oz Oral BID BM  . simvastatin  20 mg Oral q AM  . tiotropium  18 mcg Inhalation Daily  . traZODone  50 mg Oral QHS   Continuous Infusions: . sodium chloride 250 mL (02/24/21 1235)  . furosemide (LASIX) 200 mg in dextrose 5% 100 mL (2mg /mL) infusion 6 mg/hr (02/27/21 1742)     LOS: 9 days    Time spent: 30 minutes    Desma Maxim, MD [facility]  If 7PM-7AM, please contact night-coverage 02/28/2021, 9:43 AM

## 2021-03-01 ENCOUNTER — Inpatient Hospital Stay: Payer: PPO

## 2021-03-01 LAB — CBC WITH DIFFERENTIAL/PLATELET
Abs Immature Granulocytes: 0.04 10*3/uL (ref 0.00–0.07)
Basophils Absolute: 0 10*3/uL (ref 0.0–0.1)
Basophils Relative: 0 %
Eosinophils Absolute: 0.2 10*3/uL (ref 0.0–0.5)
Eosinophils Relative: 2 %
HCT: 27.6 % — ABNORMAL LOW (ref 39.0–52.0)
Hemoglobin: 8.1 g/dL — ABNORMAL LOW (ref 13.0–17.0)
Immature Granulocytes: 1 %
Lymphocytes Relative: 20 %
Lymphs Abs: 1.4 10*3/uL (ref 0.7–4.0)
MCH: 23.1 pg — ABNORMAL LOW (ref 26.0–34.0)
MCHC: 29.3 g/dL — ABNORMAL LOW (ref 30.0–36.0)
MCV: 78.6 fL — ABNORMAL LOW (ref 80.0–100.0)
Monocytes Absolute: 0.6 10*3/uL (ref 0.1–1.0)
Monocytes Relative: 9 %
Neutro Abs: 4.5 10*3/uL (ref 1.7–7.7)
Neutrophils Relative %: 68 %
Platelets: 247 10*3/uL (ref 150–400)
RBC: 3.51 MIL/uL — ABNORMAL LOW (ref 4.22–5.81)
RDW: 19.3 % — ABNORMAL HIGH (ref 11.5–15.5)
WBC: 6.7 10*3/uL (ref 4.0–10.5)
nRBC: 0 % (ref 0.0–0.2)

## 2021-03-01 LAB — PROTEIN, PLEURAL OR PERITONEAL FLUID: Total protein, fluid: <3 g/dL

## 2021-03-01 LAB — HEPATIC FUNCTION PANEL
ALT: 12 U/L (ref 0–44)
AST: 15 U/L (ref 15–41)
Albumin: 3 g/dL — ABNORMAL LOW (ref 3.5–5.0)
Alkaline Phosphatase: 110 U/L (ref 38–126)
Bilirubin, Direct: 0.1 mg/dL (ref 0.0–0.2)
Indirect Bilirubin: 0.6 mg/dL (ref 0.3–0.9)
Total Bilirubin: 0.7 mg/dL (ref 0.3–1.2)
Total Protein: 5.8 g/dL — ABNORMAL LOW (ref 6.5–8.1)

## 2021-03-01 LAB — BODY FLUID CELL COUNT WITH DIFFERENTIAL
Eos, Fluid: 0 %
Lymphs, Fluid: 26 %
Monocyte-Macrophage-Serous Fluid: 49 %
Neutrophil Count, Fluid: 25 %
Total Nucleated Cell Count, Fluid: 458 cu mm

## 2021-03-01 LAB — GLUCOSE, CAPILLARY
Glucose-Capillary: 164 mg/dL — ABNORMAL HIGH (ref 70–99)
Glucose-Capillary: 192 mg/dL — ABNORMAL HIGH (ref 70–99)
Glucose-Capillary: 204 mg/dL — ABNORMAL HIGH (ref 70–99)
Glucose-Capillary: 196 mg/dL — ABNORMAL HIGH (ref 70–99)

## 2021-03-01 LAB — ALBUMIN, PLEURAL OR PERITONEAL FLUID: Albumin, Fluid: 1.1 g/dL

## 2021-03-01 LAB — BASIC METABOLIC PANEL
Anion gap: 12 (ref 5–15)
BUN: 42 mg/dL — ABNORMAL HIGH (ref 8–23)
CO2: 32 mmol/L (ref 22–32)
Calcium: 8.6 mg/dL — ABNORMAL LOW (ref 8.9–10.3)
Chloride: 91 mmol/L — ABNORMAL LOW (ref 98–111)
Creatinine, Ser: 2.34 mg/dL — ABNORMAL HIGH (ref 0.61–1.24)
GFR, Estimated: 28 mL/min — ABNORMAL LOW (ref >60)
Glucose, Bld: 158 mg/dL — ABNORMAL HIGH (ref 70–99)
Potassium: 3.6 mmol/L (ref 3.5–5.1)
Sodium: 135 mmol/L (ref 135–145)

## 2021-03-01 LAB — MAGNESIUM: Magnesium: 1.9 mg/dL (ref 1.7–2.4)

## 2021-03-01 LAB — LACTATE DEHYDROGENASE, PLEURAL OR PERITONEAL FLUID: LD, Fluid: 42 U/L — ABNORMAL HIGH (ref 3–23)

## 2021-03-01 LAB — LACTATE DEHYDROGENASE: LDH: 103 U/L (ref 98–192)

## 2021-03-01 MED ORDER — INSULIN GLARGINE 100 UNIT/ML ~~LOC~~ SOLN
15.0000 [IU] | Freq: Every day | SUBCUTANEOUS | Status: DC
  Administered 2021-03-01: 15 [IU] via SUBCUTANEOUS
  Filled 2021-03-01 (×2): qty 0.15

## 2021-03-01 MED ORDER — TORSEMIDE 20 MG PO TABS
40.0000 mg | ORAL_TABLET | Freq: Every day | ORAL | Status: DC
  Administered 2021-03-01 – 2021-03-02 (×2): 40 mg via ORAL
  Filled 2021-03-01 (×2): qty 2

## 2021-03-01 NOTE — TOC Transition Note (Signed)
Transition of Care [facility]) - CM/SW Discharge Note   Patient Details  Name: [name] MRN: [id number] Date of Birth: 1945-12-29  Transition of Care [facility]) CM/SW Contact:  Kerin Salen, RN Phone Number: 03/01/2021, 3:55 PM   Clinical Narrative:  Patient to be discharged to [facility] tomorrow. [employer] authorizatin pending [phone]. I called spouse [name] who says she was at [employer] today, saw his room and signed paperwork. Called [name], [facility] admissions coordinator, who wants to be called when discharged, [phone]. Then call Nurse, [name] at the facility [phone], fax discharge summary to [phone]. Discharge summary should also be sent electronically via hub.     Final next level of care: [place] Barriers to Discharge: Continued Medical Work up   Patient Goals and CMS Choice Patient states their goals for this hospitalization and ongoing recovery are:: To return home with wife.   Choice offered to / list presented to : NA  Discharge Placement                       Discharge Plan and Services In-house Referral: Clinical Social Work              DME Arranged: N/A DME Agency: NA       HH Arranged: NA HH Agency: NA        Social Determinants of Health (SDOH) Interventions     Readmission Risk Interventions Readmission Risk Prevention Plan 02/04/2021 10/28/2020 12/27/2019  Transportation Screening Complete Complete Complete  PCP or Specialist Appt within 3-5 Days - - Complete  HRI or [place] - - Complete  Medication Review (RN Care Manager) Complete Complete Complete  PCP or Specialist appointment within 3-5 days of discharge Complete Complete -  [place] or Home Care Consult Complete Complete -  SW Recovery Care/Counseling Consult Complete - -  Palliative Care Screening Not Applicable Not Applicable -  Parole Not Applicable Not Applicable -  Some recent data might be  hidden

## 2021-03-01 NOTE — Progress Notes (Signed)
During assessment-  noted patient open mouth breathing with shallow quick breaths.  Reports feeling SOB, request to turn up O2.  Patient O2 was weaned down to 3L.    Turned back up to 4L.  O2 saturations at 94% on 4L.

## 2021-03-01 NOTE — Progress Notes (Signed)
PROGRESS NOTE    [name]  IZT:[id number] DOB: [birth year] DOA: 02/19/2021 PCP: [name], MD   Brief Narrative:  [name] is a 75 y.o. 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 Patient states that he has had weakness for the last 3 days which has made it difficult for him to ambulate.  This morning he states he was unable to get off the toilet and actually had a fall this morning. He denies feeling dizzy or lightheaded and complains of just generalized weakness. He denies having any chest pain, no shortness of breath, no fever, no chills, no headache, no abdominal pain, no nausea, no vomiting, no diarrhea, no urinary symptoms, no blurred vision, no difficulty swallowing, no headache.  Found to be bradycardic, hypoglycemic, borderline hypotensive.  Found to have significant AKI thought to be due to overdiuresis.  Case discussed with cardiology and nephrology.  Recommendations appreciated.  4/21: This morning patient was complaining of worsening shortness of breath.  Stat chest x-ray demonstrates fluid overload.  Administered 60 mg IV Lasix.  CPAP ordered.  4/22: Patient still endorsing shortness of breath although improved from prior.  Tolerated BiPAP okay.  Does better with it on. 4/23: On Lasix infusion.  Continues to require CPAP. 4/24: Slowly improving.  Alternate between 4 L nasal cannula and CPAP. remains on Lasix drip 4/26: Dyspnea and fatigue persistent.  Patient had -5.7 L however chest x-ray today continues to demonstrate vascular congestion.  Patient alternating between 4 L nasal cannula and CPAP.  Case discussed with nephrology, Lasix GTT increased to 6 mg/h and added metolazone 2.5 mg daily 4/27:  stable, continues lasix gtt 4/28: stable, continuing lasix gtt 4/29: lasix gtt off. Stable elevated o2 requirement. CXR shows b/l effusions, plan for thoracentesis.    Assessment & Plan:   Principal Problem:   AKI (acute kidney injury) ([place]) Active Problems:   Chronic kidney disease (CKD) stage G3b/A3, moderately decreased glomerular filtration rate (GFR) between 30-44 mL/min/1.73 square meter and albuminuria creatinine ratio greater than 300 mg/g (HCC)   History of CVA (cerebrovascular accident)   Chronic diastolic CHF (congestive heart failure) (HCC)   Atrial fibrillation, chronic (HCC)   Type 2 diabetes mellitus with hypoglycemia without coma (HCC)   Bradycardia   Weakness generalized   Dental infection   Pressure injury of skin  Acute kidney injury on chronic kidney disease stage IIIb  Acute tubular necrosis Developed edema on chest x-ray At baseline patient has a serum creatinine of 1.95, on admission his creatinine is 3.95 Downtrending though a bit up today to 2.34. uop good. Nephrology consulted, have been following for course of admission. Normal EF and diastolic parameters on January 2022. Plan: No IV fluids Lasix gtt discontinued, torsemide 40 started Added metolazone 2.5 mg daily Careful monitoring of volume status Daily BMP Possible d/c tomorrow  Acute on chronic anemia Dark stools Hb drifting down to 7 on 4/22. Received one unit. Since then stable 8-9. GI following, declining intervention, unclear if true GIB - eliquis paused, ok to re-start tomorrow if hgb stable - protonix IV bid started, ok to resume oral dosing tomorrow - po iron held  Atrial fibrillation with slow ventricular response Chronic issue per Dr. Ubaldo Glassing who the patient is well-known to Plan:  Avoid AV nodal blocking agents - holding apixaban as above, restart tomorrow if hgb stable  Diabetes mellitus with complications of stage III chronic kidney disease Mild elevations - re-started home  lantus at lower dose of 10, will increase to 15 - Started SSI  COPD with acute on chronic respiratory failure Not acutely exacerbated. Baseline 2 L, here requiring 5 with signs volume overload on CXR - Continue Spiriva, as needed bronchodilator therapy and inhaled steroids - Wean oxygen as tolerated - Home CPAP when sleeping  Pleural effusion Bilateral. Likely transudatate. Pt says never been sampled, agrees to procedure - therapeutic/diagnostic thoracentesis ordered  Acute on chronic diastolic congestive heart failure S/p lasix gtt - torsemide daily ordered - cardiology has signed off, f/u dr. Ubaldo Glassing 1 week  Coronary artery disease Stable and not acutely exacerbated Continue statins and aspirin Hold nitrates  Generalized weakness/fall  Multifactorial and related to bradycardia as well as hypotension Place patient on fall precautions PT/OT advising SNF, is approved  Chronic pain - will hold gabapentin/oxycodone given signs sedation  Dental infection Patient noted to have purulence around his gum line on admission Cervical spine CT shows an expansile lucent lesion with pathologic fracture  identified involving the midline the maxilla. Recommend further evaluation with dedicated maxillofacial CT with contrast. Discussed w/ ENT Richardson Landry on 4/27, think ok to get CT when kidney function allows but says likely this is a dental problem, would need f/u with dental. S/p 8 days unasyn - consider CT maxillofacial if/when kidney function allows vs dental f/u  DVT prophylaxis: SCDs Code Status: Full Family Communication:  Wife updated telephonically 4/27 Disposition Plan: Status is: Inpatient  Remains inpatient appropriate because:Inpatient level of care appropriate due to severity of illness   Dispo: The patient is from: Home              Anticipated d/c is to: SNF (has bed so should be able to discharge when stable)              Patient currently is not medically stable to  d/c.   Difficult to place patient No   Level of care: Progressive Cardiac  Consultants:  Nephrology Cardiology GI   Procedures:None  Antimicrobials:   Unasyn   Subjective: Patient seen and examined.  Continues to be fatigued, this is unchanged.  Breathing is stable. Has appetite. No changes overnight.  Objective: Vitals:   03/01/21 0500 03/01/21 0543 03/01/21 0823 03/01/21 1137  BP:  (!) 138/44 140/69 (!) 123/47  Pulse:  (!) 46 (!) 57 (!) 52  Resp:  20 17 18   Temp:  97.7 F (36.5 C) 98.7 F (37.1 C) 98.7 F (37.1 C)  TempSrc:   Oral Oral  SpO2:  99% 100% 100%  Weight: 80.4 kg     Height:        Intake/Output Summary (Last 24 hours) at 03/01/2021 1320 Last data filed at 03/01/2021 0500 Gross per 24 hour  Intake 360 ml  Output 2900 ml  Net -2540 ml   Filed Weights   02/27/21 0500 02/28/21 0915 03/01/21 0500  Weight: 83.2 kg 82.8 kg 80.4 kg    Examination:  General exam: No acute distress Respiratory system: Scattered crackles bilaterally.  Normal work of breathing.  5 L cardiovascular system: S1-S2, regular rate and rhythm, no murmurs, 1+ pedal edema  gastrointestinal system: Abdomen is nondistended, soft and nontender. No organomegaly or masses felt. Normal bowel sounds heard. Central nervous system: Alert, oriented x2, no focal deficits, lethargic extremities: Symmetric  5 x 5 power. Skin: No rashes, lesions or ulcers Psychiatry: Judgement and insight appear impaired. Mood & affect flattened.     Data Reviewed: I have personally reviewed following labs and imaging studies  CBC: Recent Labs  Lab 02/25/21 0531 02/26/21 0003 02/27/21 0558 02/28/21 0559 03/01/21 0534  WBC 5.7 6.3 5.9 6.7 6.7  NEUTROABS 3.9 4.7 4.1 4.6 4.5  HGB 8.5* 8.3* 8.0* 9.0* 8.1*  HCT 28.7* 27.0* 26.5* 29.4* 27.6*  MCV 79.7* 79.2* 79.1* 79.5* 78.6*  PLT 254 236 232 252 659   Basic Metabolic Panel: Recent Labs  Lab 02/23/21 1606 02/24/21 0419 02/25/21 0531  02/26/21 0003 02/27/21 0558 02/28/21 0559 03/01/21 0534  NA  --  138  --  136 135 136 135  K  --  4.3  --  4.3 3.8 3.5 3.6  CL  --  102  --  98 97* 93* 91*  CO2  --  25  --  27 28 32 32  GLUCOSE  --  203*  --  219* 181* 94 158*  BUN  --  53*  --  45* 40* 37* 42*  CREATININE  --  2.53* 2.42* 2.41* 2.21* 2.17* 2.34*  CALCIUM  --  8.4*  --  8.2* 8.2* 8.7* 8.6*  MG  --   --   --   --   --   --  1.9  PHOS 4.2  --   --   --   --   --   --    GFR: Estimated Creatinine Clearance: 27.2 mL/min (A) (by C-G formula based on SCr of 2.34 mg/dL (H)). Liver Function Tests: No results for input(s): AST, ALT, ALKPHOS, BILITOT, PROT, ALBUMIN in the last 168 hours. No results for input(s): LIPASE, AMYLASE in the last 168 hours. No results for input(s): AMMONIA in the last 168 hours. Coagulation Profile: No results for input(s): INR, PROTIME in the last 168 hours. Cardiac Enzymes: No results for input(s): CKTOTAL, CKMB, CKMBINDEX, TROPONINI in the last 168 hours. BNP (last 3 results) No results for input(s): PROBNP in the last 8760 hours. HbA1C: No results for input(s): HGBA1C in the last 72 hours. CBG: Recent Labs  Lab 02/28/21 1141 02/28/21 1559 02/28/21 2026 03/01/21 0824 03/01/21 1136  GLUCAP 221* 229* 225* 164* 192*   Lipid Profile: No results for input(s): CHOL, HDL, LDLCALC, TRIG, CHOLHDL, LDLDIRECT in the last 72 hours. Thyroid Function Tests: No results for input(s): TSH, T4TOTAL, FREET4, T3FREE, THYROIDAB in the last 72 hours. Anemia Panel: No results for input(s): VITAMINB12, FOLATE, FERRITIN, TIBC, IRON, RETICCTPCT in the last 72 hours. Sepsis Labs: No results for input(s): PROCALCITON, LATICACIDVEN in the last 168 hours.  No results found for this or any previous visit (from the past 240 hour(s)).       Radiology Studies: DG Chest Port 1 View  Result Date: 03/01/2021 CLINICAL DATA:  Weakness. EXAM: PORTABLE CHEST 1 VIEW COMPARISON:  February 26, 2021. FINDINGS: Stable  cardiomegaly. Status post coronary bypass graft. Central pulmonary vascular congestion is noted with bibasilar opacities concerning for edema and associated pleural effusions. No pneumothorax is noted. Bony thorax is unremarkable. IMPRESSION: Stable cardiomegaly with central pulmonary vascular congestion and probable bibasilar pulmonary edema and pleural effusions. Aortic Atherosclerosis (ICD10-I70.0). Electronically Signed   By: Marijo Conception M.D.   On: 03/01/2021 11:33        Scheduled Meds: . sodium chloride   Intravenous Once  . acidophilus  1 capsule Oral Daily  . cholecalciferol  1,000 Units Oral Daily  . hydrocortisone  25 mg Rectal BID  . insulin aspart  0-20 Units Subcutaneous TID WC  . insulin glargine  10 Units Subcutaneous QHS  . magnesium oxide  400 mg Oral Daily  . metolazone  2.5 mg Oral Daily  . multivitamin-lutein  1 capsule Oral Daily  . nicotine  21 mg Transdermal Daily  . pantoprazole (PROTONIX) IV  40 mg Intravenous Q12H  . polyethylene glycol  17 g Oral Daily  . Ensure Max Protein  11 oz Oral BID BM  . simvastatin  20 mg Oral q AM  . tiotropium  18 mcg Inhalation Daily  . torsemide  40 mg Oral Daily  . traZODone  50 mg Oral QHS   Continuous Infusions: . sodium chloride 250 mL (02/24/21 1235)     LOS: 10 days    Time spent: 30 minutes    Desma Maxim, MD [facility]  If 7PM-7AM, please contact night-coverage 03/01/2021, 1:20 PM

## 2021-03-01 NOTE — Progress Notes (Signed)
[facility] Cardiology  Patient Description: [name] is a 75 year old male with PMH significant for CAD s/p CABG (2006), h/o HFrEF (most recent EF = 50-55%), paroxysmal atrial fibrillation (on Eliquis), h/o aortic valve stenosis, h/o CVA, CKD, DM type II, hyperlipidemia, hypertension, COPD and tobacco use who was admitted due to acute CHF exacerbation and AKI.  SUBJECTIVE: The patient reports to be doing okay today and even states that his breathing is slightly better on today. He continues to deny having any cardiac symptoms at this time. He states that he has not been out of bed yet today.   02/28/21:  The patient reports to be doing well on today and states that his dyspnea has slightly improved. He denies having any other cardiac symptoms at this time. The patient states that he has been able to get out of bed to the chair will assistance but states that he continues to feel "weak." He denies having any dizziness or lightheadedness.   02/27/2021: UTA.   02/26/2021: The patient reports to be feeling slightly better on today. He reports that his dyspnea remains unchanged and he continues to deny having any chest pain, dizziness or palpitations at this time.   02/25/21: The patient reports to be doing "okay" on today and he denies having any chest pain at this time. He states that he continues to have dyspnea that remains unchanged since his admission.   02/22/2021: The patient reports to be doing " so, so" on today and states that he is having a hard time breathing with the CPAP machine.  He states that it "feels as if I am not getting enough air" on the CPAP machine. The patient's oxygen saturation is at 98%, respirations are 16 and he has a slightly increased effort of breathing at this time.  The patient denies having any chest pain, palpitations, peripheral edema or dizziness at this time. However, the patient does report tenderness to the BLE upon palpation.   OBJECTIVE: The patient appears slightly better  and his HR continues to improve. He continues to be in atrial fibrillation on the telemetry monitor with a HR of 69 bpm on today. The patient does not have any cardiac symptoms and his dyspnea appears to be improving. He is currently on 4L of supplemental oxygen via [place] with stable oxygen saturation levels. The patient continues to have +2 LLE and has been been transitioned off of the lasix gtt to oral torsemide therapy. The patient denies having any pain or numbness to the LLE.   02/28/21: The patient appears slightly better on today, he continues to be on supplemental oxygen via [place] as well as the lasix gtt at 6mg /hr. The patient is not any in distress and he has normal effort of breathing. The patient's BLE has +1 pitting edema and he has bilateral SCD's in place. Urine output continues to be adequate. The patient continues to be in atrial fibrillation with slow ventricular response but his HR has improved to the high 50's to low 60's bpm range. He continues to be asymptomatic.   02/27/2021: The patient is sleeping comfortably with CPAP machine on. He is Alert and woke up when he heard his name called and fell back to sleep shortly afterwards. The patient appears to be better on today, he appears euvolemic and he does not appear to be in any distress. The patient continues to be in atrial fibrillation with slow ventricular response as exemplified on the telemetry monitor. The patient's foley catheter remains in place  and he appears to have adeqaute urine output.    02/26/21:The patient appears much better on today as he is more interactive and smiling. He continues to be in atrial fibrilation with slow ventricular response as noted by the bedside telemetry monitoring. The patient continues to have vascular congestion, despite the decrease in his peripheral edema. He continues to be on a IV Lasix drip which has been increased by nephrology to 6 mg/hr and he has also been placed on metalazone 2.5mg  daily. The  patient has good urinary output at this time and the indwelling catheter remains in place. The patient appears fair on today, his vss and he is in no apparent distress.  The patient continues to be on supplemental oxygen via nasal cannula and utilizes CPAP machine at night.   02/25/21: The patient is sitting comfortably in bed eating lunch. He is now on supplemental oxygen via nasal cannula with oxygen saturations that are within normal range.  The patient continues to be in atrial fibrillation with a slow ventricular response in the high 40s.  The patient continues to remain asymptomatic.  The patient continues to be on a furosemide drip due to hypervolemia. Upon examination he appears to having +1 pitting edema in the BLE. The patient appears acutely ill, his other vital signs are stable and he is in no apparent distress.  02/22/2021: The patient appears chronically ill on today, he continues to be in fibrillation with slow ventricular response and a HR between 38-69 bpm as noted by the bedside telemetry monitor.  The patient continues to remain asymptomatic and all of his other vital signs are stable. The patient's hemoglobin on today is 7.0 and he will be undergoing a blood transfusion proceeded by IV lasix. The patient appears euvolemic on today and has minimal, nonpitting BLE edema.  Vitals:   02/28/21 2010 03/01/21 0500 03/01/21 0543 03/01/21 0823  BP: 140/65  (!) 138/44 140/69  Pulse: (!) 52  (!) 46 (!) 57  Resp: 20  20 17   Temp: 98.6 F (37 C)  97.7 F (36.5 C) 98.7 F (37.1 C)  TempSrc: Oral   Oral  SpO2: 96%  99% 100%  Weight:  80.4 kg    Height:         Intake/Output Summary (Last 24 hours) at 03/01/2021 1035 Last data filed at 03/01/2021 0500 Gross per 24 hour  Intake 360 ml  Output 2900 ml  Net -2540 ml      PHYSICAL EXAM  General: Well developed, well nourished, in no acute distress HEENT:  Normocephalic and atraumatic. PERRL Neck:  No JVD.  Lungs: clear bilaterally  to auscultation in the upper lobes, diminished at bases.  Chest expansion symmetrical, normal effort of breathing, negative for rhonchi, crackles or rales Heart: Irregular heart rate and rhythm. Normal S1 and S2 without gallops or murmurs.  Abdomen: Bowel sounds are positive, abdomen soft and non-tender  Msk:  Normal strength and tone for age. Extremities: +2 pitting LLE edema.  No clubbing or cyanosis Neuro: Alert and oriented X 3. Psych:  Good affect, responds appropriately   LABS: Basic Metabolic Panel: Recent Labs    02/28/21 0559 03/01/21 0534  NA 136 135  K 3.5 3.6  CL 93* 91*  CO2 32 32  GLUCOSE 94 158*  BUN 37* 42*  CREATININE 2.17* 2.34*  CALCIUM 8.7* 8.6*  MG  --  1.9   Liver Function Tests: No results for input(s): AST, ALT, ALKPHOS, BILITOT, PROT, ALBUMIN in the last  72 hours. No results for input(s): LIPASE, AMYLASE in the last 72 hours. CBC: Recent Labs    02/28/21 0559 03/01/21 0534  WBC 6.7 6.7  NEUTROABS 4.6 4.5  HGB 9.0* 8.1*  HCT 29.4* 27.6*  MCV 79.5* 78.6*  PLT 252 247   Cardiac Enzymes: No results for input(s): CKTOTAL, CKMB, CKMBINDEX, TROPONINI in the last 72 hours. BNP: Invalid input(s): POCBNP D-Dimer: No results for input(s): DDIMER in the last 72 hours. Hemoglobin A1C: No results for input(s): HGBA1C in the last 72 hours. Fasting Lipid Panel: No results for input(s): CHOL, HDL, LDLCALC, TRIG, CHOLHDL, LDLDIRECT in the last 72 hours. Thyroid Function Tests: No results for input(s): TSH, T4TOTAL, T3FREE, THYROIDAB in the last 72 hours.  Invalid input(s): FREET3 Anemia Panel: No results for input(s): VITAMINB12, FOLATE, FERRITIN, TIBC, IRON, RETICCTPCT in the last 72 hours.  No results found.   Echo: (11/15/2020)  IMPRESSIONS  1. Left ventricular ejection fraction, by estimation, is 50 to 55%. The  left ventricle has low normal function. The left ventricle has no regional  wall motion abnormalities. Left ventricular diastolic  parameters were  normal.  2. Right ventricular systolic function is normal. The right ventricular  size is mildly enlarged.  3. The mitral valve was not well visualized. Mild mitral valve  regurgitation.  4. The aortic valve is calcified. Aortic valve regurgitation is trivial.  5. Aortic dilatation noted.   TELEMETRY: Atrial fibrillation with slow ventricular response  ASSESSMENT AND PLAN:  Principal Problem:   AKI (acute kidney injury) ([place]) Active Problems:   Chronic kidney disease (CKD) stage G3b/A3, moderately decreased glomerular filtration rate (GFR) between 30-44 mL/min/1.73 square meter and albuminuria creatinine ratio greater than 300 mg/g (HCC)   History of CVA (cerebrovascular accident)   Chronic diastolic CHF (congestive heart failure) (HCC)   Atrial fibrillation, chronic (HCC)   Type 2 diabetes mellitus with hypoglycemia without coma (HCC)   Bradycardia   Weakness generalized   Dental infection   Pressure injury of skin    1. Acute on Chronic diastolic HF, reasonably stable  -Patient has been transition from lasix gtt to oral Torsemide. Nephrology input appreciated.  -Avoid AV nodal blocking agents at this time due to slow ventricular response.  -Recommend following a low-sodium diet, performing daily weights and monitoring strict I's and O's.  -patient should follow up with Dr. Ubaldo Glassing in 1 week post discharge.   2. Atrial fibrillation with slow ventricular response, reasonably stable  -We will continue anticoagulation with Eliquis.  -Recommend following bleeding precautions.  -Rate control medication not indicated at this time in the presence of slow ventricular response.   -Consider possible PPM as outpatient.   -Recommend continuous telemetry monitoring until discharge.  3. CAD s/p CABG, stable, not exacerbated at this time  -Recommend continuing statin therapy.  -Recommend following heart healthy diet.  4. AKI, fairly stable with improvement in  creatinine  -Nephrology consult appreciated.  5. Severe anemia s/p blood transfusion of 02/22/21, patient's hemoglobin level today is 8.5>>8.3>>8.0>>9.0  -Agree with current management.  6.  COPD with acute on chronic respiratory failure, fairly stable, patient is having dyspnea at this time  -Agree with current therapy with supplemental oxygen via nasal cannula and CPAP use while asleep.   -RT evaluation and treatment greatly appreciated.  -Recommend PT/OT evaluation.   7. HTN, reasonably controlled, patient normotensive on today  -Recommend continuing to follow a low-sodium diet.  8. HLD  -Recommend continuing statin therapy.  ---Cardiology is signing off at  this point. Please re-consult if needed. The patient should follow up with Dr. Ubaldo Glassing as outpatient 1 week post discharge.---  The patient's history, physical exam findings and plan of care were all discussed with Dr. Karma Greaser D. [place], whom also evaluated the patient, and all decision making was made in collaboration with him.       Geniyah Eischeid, ACNPC-AG  03/01/2021 10:35 AM

## 2021-03-01 NOTE — Progress Notes (Signed)
[facility]  ROUNDING NOTE   Subjective:   [name] is a 76 y.o.male with past medical history of chronic Atrial Fibrillation, CHF, diabetes, CKD stage 3, GERD and hypertension. He presents to the ED with generalized weakness for 3 days.   We were consulted for evaluate of acute kidney injury. Baseline Creatinine of 1.95, BUN 70, and EGFR 15 on admission.   Patient seen laying in bed Alert and oriented Tolerating meals Currently on 4L [place] Per nursing, he was weaned to 3L unsuccessfully   Objective:  Vital signs in last 24 hours:  Temp:  [97.7 F (36.5 C)-98.7 F (37.1 C)] 98.7 F (37.1 C) (04/29 1137) Pulse Rate:  [42-57] 52 (04/29 1137) Resp:  [16-20] 18 (04/29 1137) BP: (123-140)/(44-69) 123/47 (04/29 1137) SpO2:  [96 %-100 %] 100 % (04/29 1137) Weight:  [80.4 kg] 80.4 kg (04/29 0500)  Weight change:  Filed Weights   02/27/21 0500 02/28/21 0915 03/01/21 0500  Weight: 83.2 kg 82.8 kg 80.4 kg    Intake/Output: I/O last 3 completed shifts: In: 840 [P.O.:840] Out: 5100 [Urine:5100]   Intake/Output this shift:  No intake/output data recorded.  Physical Exam: General: NAD  Head: Normocephalic, atraumatic. Moist oral mucosal membranes  Eyes: Anicteric  Lungs:  Fine Crackles in left base,O2 4L [place]  Heart: Regular rate and rhythm  Abdomen:  Soft, nontender,   Extremities:  no peripheral edema.  Neurologic: Nonfocal, moving all four extremities  Skin: No lesions  GU Foley    Basic Metabolic Panel: Recent Labs  Lab 02/23/21 1606 02/24/21 0419 02/25/21 0531 02/26/21 0003 02/27/21 0558 02/28/21 0559 03/01/21 0534  NA  --  138  --  136 135 136 135  K  --  4.3  --  4.3 3.8 3.5 3.6  CL  --  102  --  98 97* 93* 91*  CO2  --  25  --  27 28 32 32  GLUCOSE  --  203*  --  219* 181* 94 158*  BUN  --  53*  --  45* 40* 37* 42*  CREATININE  --  2.53* 2.42* 2.41* 2.21* 2.17* 2.34*  CALCIUM  --  8.4*  --  8.2* 8.2* 8.7* 8.6*  MG  --   --   --   --   --    --  1.9  PHOS 4.2  --   --   --   --   --   --     Liver Function Tests: No results for input(s): AST, ALT, ALKPHOS, BILITOT, PROT, ALBUMIN in the last 168 hours. No results for input(s): LIPASE, AMYLASE in the last 168 hours. No results for input(s): AMMONIA in the last 168 hours.  CBC: Recent Labs  Lab 02/25/21 0531 02/26/21 0003 02/27/21 0558 02/28/21 0559 03/01/21 0534  WBC 5.7 6.3 5.9 6.7 6.7  NEUTROABS 3.9 4.7 4.1 4.6 4.5  HGB 8.5* 8.3* 8.0* 9.0* 8.1*  HCT 28.7* 27.0* 26.5* 29.4* 27.6*  MCV 79.7* 79.2* 79.1* 79.5* 78.6*  PLT 254 236 232 252 247    Cardiac Enzymes: No results for input(s): CKTOTAL, CKMB, CKMBINDEX, TROPONINI in the last 168 hours.  BNP: Invalid input(s): POCBNP  CBG: Recent Labs  Lab 02/28/21 1141 02/28/21 1559 02/28/21 2026 03/01/21 0824 03/01/21 1136  GLUCAP 221* 229* 225* 164* 192*    Microbiology: Results for orders placed or performed during the hospital encounter of 02/19/21  Resp Panel by RT-PCR (Flu A&B, Covid) Nasopharyngeal Swab  Status: None   Collection Time: 02/19/21  9:56 AM   Specimen: Nasopharyngeal Swab; Nasopharyngeal(NP) swabs in vial transport medium  Result Value Ref Range Status   SARS Coronavirus 2 by RT PCR NEGATIVE NEGATIVE Final    Comment: (NOTE) SARS-CoV-2 target nucleic acids are NOT DETECTED.  The SARS-CoV-2 RNA is generally detectable in upper respiratory specimens during the acute phase of infection. The lowest concentration of SARS-CoV-2 viral copies this assay can detect is 138 copies/mL. A negative result does not preclude SARS-Cov-2 infection and should not be used as the sole basis for treatment or other patient management decisions. A negative result may occur with  improper specimen collection/handling, submission of specimen other than nasopharyngeal swab, presence of viral mutation(s) within the areas targeted by this assay, and inadequate number of viral copies(<138 copies/mL). A negative  result must be combined with clinical observations, patient history, and epidemiological information. The expected result is Negative.  Fact Sheet for Patients:  EntrepreneurPulse.com.au  Fact Sheet for Healthcare Providers:  IncredibleEmployment.be  This test is no t yet approved or cleared by the [place] FDA and  has been authorized for detection and/or diagnosis of SARS-CoV-2 by FDA under an Emergency Use Authorization (EUA). This EUA will remain  in effect (meaning this test can be used) for the duration of the COVID-19 declaration under Section 564(b)(1) of the Act, 21 U.S.C.section 360bbb-3(b)(1), unless the authorization is terminated  or revoked sooner.       Influenza A by PCR NEGATIVE NEGATIVE Final   Influenza B by PCR NEGATIVE NEGATIVE Final    Comment: (NOTE) The Xpert Xpress SARS-CoV-2/FLU/RSV plus assay is intended as an aid in the diagnosis of influenza from Nasopharyngeal swab specimens and should not be used as a sole basis for treatment. Nasal washings and aspirates are unacceptable for Xpert Xpress SARS-CoV-2/FLU/RSV testing.  Fact Sheet for Patients: EntrepreneurPulse.com.au  Fact Sheet for Healthcare Providers: IncredibleEmployment.be  This test is not yet approved or cleared by the [place] FDA and has been authorized for detection and/or diagnosis of SARS-CoV-2 by FDA under an Emergency Use Authorization (EUA). This EUA will remain in effect (meaning this test can be used) for the duration of the COVID-19 declaration under Section 564(b)(1) of the Act, 21 U.S.C. section 360bbb-3(b)(1), unless the authorization is terminated or revoked.  Performed at [facility], [address] [place], [place] [address]     Coagulation Studies: No results for input(s): LABPROT, INR in the last 72 hours.  Urinalysis: No results for input(s): COLORURINE, LABSPEC,  PHURINE, GLUCOSEU, HGBUR, BILIRUBINUR, KETONESUR, PROTEINUR, UROBILINOGEN, NITRITE, LEUKOCYTESUR in the last 72 hours.  Invalid input(s): APPERANCEUR    Imaging: DG Chest Port 1 View  Result Date: 03/01/2021 CLINICAL DATA:  Weakness. EXAM: PORTABLE CHEST 1 VIEW COMPARISON:  February 26, 2021. FINDINGS: Stable cardiomegaly. Status post coronary bypass graft. Central pulmonary vascular congestion is noted with bibasilar opacities concerning for edema and associated pleural effusions. No pneumothorax is noted. Bony thorax is unremarkable. IMPRESSION: Stable cardiomegaly with central pulmonary vascular congestion and probable bibasilar pulmonary edema and pleural effusions. Aortic Atherosclerosis (ICD10-I70.0). Electronically Signed   By: Marijo Conception M.D.   On: 03/01/2021 11:33     Medications:   . sodium chloride 250 mL (02/24/21 1235)   . sodium chloride   Intravenous Once  . acidophilus  1 capsule Oral Daily  . cholecalciferol  1,000 Units Oral Daily  . hydrocortisone  25 mg Rectal BID  . insulin aspart  0-20  Units Subcutaneous TID WC  . insulin glargine  10 Units Subcutaneous QHS  . magnesium oxide  400 mg Oral Daily  . metolazone  2.5 mg Oral Daily  . multivitamin-lutein  1 capsule Oral Daily  . nicotine  21 mg Transdermal Daily  . pantoprazole (PROTONIX) IV  40 mg Intravenous Q12H  . polyethylene glycol  17 g Oral Daily  . Ensure Max Protein  11 oz Oral BID BM  . simvastatin  20 mg Oral q AM  . tiotropium  18 mcg Inhalation Daily  . torsemide  40 mg Oral Daily  . traZODone  50 mg Oral QHS   sodium chloride, acetaminophen, albuterol, ipratropium-albuterol, melatonin, ondansetron **OR** ondansetron (ZOFRAN) IV    Renal U/s  FINDINGS: Right Kidney:   Renal measurements: 9.8 x 4.5 x 4.0 cm = volume: 91.2 mL. Mild increased echogenicity. No mass or hydronephrosis visualized.   Left Kidney:   Renal measurements: 9.4 x 5.3 x 5.1 cm = volume: 132.8 mL. Mild increased  echogenicity. No mass or hydronephrosis visualized.   Bladder:   Mild bladder distention cannot be excluded.   Other:   Small right pleural effusion cannot be excluded.   IMPRESSION: 1. Mild increased echogenicity both kidneys suggesting chronic medical renal disease. No acute renal abnormality identified.   2.  Bladder distention cannot be excluded.   3.  Small right pleural effusion cannot be excluded.     Assessment/ Plan:  [name] is a 75 y.o.  male with past medical history of chronic Atrial Fibrillation, CHF, diabetes, CKD stage 3, GERD and hypertension. He presents to the ED with generalized weakness for 3 days.   1) AKI secondary to ATN  CKD risk factors include diabetes, hypertension, age, atherosclerosis.  Increased echogenicity noted in both kidneys. Lab Results  Component Value Date   CREATININE 2.34 (H) 03/01/2021   CREATININE 2.17 (H) 02/28/2021   CREATININE 2.21 (H) 02/27/2021    Creatinine rose to 2.34 Will d/c Lasix Urine output 3.9L Chest X-ray to evaluate pulmonary edema Results- probable bibasilar pulmonary edema and pleural effusions   2) volume overload Lasix drip d/c Torsemide 24m daily   3)Anemia of chronic disease and iron deficiency anemia  CBC Latest Ref Rng & Units 03/01/2021 02/28/2021 02/27/2021  WBC 4.0 - 10.5 K/uL 6.7 6.7 5.9  Hemoglobin 13.0 - 17.0 g/dL 8.1(L) 9.0(L) 8.0(L)  Hematocrit 39.0 - 52.0 % 27.6(L) 29.4(L) 26.5(L)  Platelets 150 - 400 K/uL 247 252 232    Results for RJOUD, INGWERSEN(MRN [id number] as of 02/23/2021 15:43  Ref. Range 01/28/2021 05:43  Iron Latest Ref Range: 45 - 182 ug/dL 25 (L)  UIBC Latest Units: ug/dL 431  TIBC Latest Ref Range: 250 - 450 ug/dL 456 (H)  Saturation Ratios Latest Ref Range: 17.9 - 39.5 % 6 (L)   Patient did receive PRBC during this admission Will continue to monitor       LOS: 1[place]4/29/202212:55 PM [facility]

## 2021-03-01 NOTE — Procedures (Signed)
Ultrasound-guided diagnostic and therapeutic right sided thoracentesis performed yielding 800 mililiters of straw colored fluid. No immediate complications.   Diagnostic fluid was sent to the lab for further analysis. Follow-up chest x-ray pending. EBL is < 2 ml.

## 2021-03-02 DIAGNOSIS — M2550 Pain in unspecified joint: Secondary | ICD-10-CM | POA: Diagnosis not present

## 2021-03-02 DIAGNOSIS — M6281 Muscle weakness (generalized): Secondary | ICD-10-CM | POA: Diagnosis not present

## 2021-03-02 DIAGNOSIS — N170 Acute kidney failure with tubular necrosis: Secondary | ICD-10-CM | POA: Diagnosis not present

## 2021-03-02 DIAGNOSIS — Z7901 Long term (current) use of anticoagulants: Secondary | ICD-10-CM | POA: Diagnosis not present

## 2021-03-02 DIAGNOSIS — J984 Other disorders of lung: Secondary | ICD-10-CM | POA: Diagnosis not present

## 2021-03-02 DIAGNOSIS — J9611 Chronic respiratory failure with hypoxia: Secondary | ICD-10-CM | POA: Diagnosis not present

## 2021-03-02 DIAGNOSIS — Z79899 Other long term (current) drug therapy: Secondary | ICD-10-CM | POA: Diagnosis not present

## 2021-03-02 DIAGNOSIS — N183 Chronic kidney disease, stage 3 unspecified: Secondary | ICD-10-CM | POA: Diagnosis not present

## 2021-03-02 DIAGNOSIS — J449 Chronic obstructive pulmonary disease, unspecified: Secondary | ICD-10-CM | POA: Diagnosis not present

## 2021-03-02 DIAGNOSIS — R279 Unspecified lack of coordination: Secondary | ICD-10-CM | POA: Diagnosis not present

## 2021-03-02 DIAGNOSIS — I5033 Acute on chronic diastolic (congestive) heart failure: Secondary | ICD-10-CM | POA: Diagnosis not present

## 2021-03-02 DIAGNOSIS — E785 Hyperlipidemia, unspecified: Secondary | ICD-10-CM | POA: Diagnosis not present

## 2021-03-02 DIAGNOSIS — E1140 Type 2 diabetes mellitus with diabetic neuropathy, unspecified: Secondary | ICD-10-CM | POA: Diagnosis not present

## 2021-03-02 DIAGNOSIS — R296 Repeated falls: Secondary | ICD-10-CM | POA: Diagnosis not present

## 2021-03-02 DIAGNOSIS — G8929 Other chronic pain: Secondary | ICD-10-CM | POA: Diagnosis not present

## 2021-03-02 DIAGNOSIS — D638 Anemia in other chronic diseases classified elsewhere: Secondary | ICD-10-CM | POA: Diagnosis not present

## 2021-03-02 DIAGNOSIS — Z87891 Personal history of nicotine dependence: Secondary | ICD-10-CM | POA: Diagnosis not present

## 2021-03-02 DIAGNOSIS — I2510 Atherosclerotic heart disease of native coronary artery without angina pectoris: Secondary | ICD-10-CM | POA: Diagnosis not present

## 2021-03-02 DIAGNOSIS — Z951 Presence of aortocoronary bypass graft: Secondary | ICD-10-CM | POA: Diagnosis not present

## 2021-03-02 DIAGNOSIS — I110 Hypertensive heart disease with heart failure: Secondary | ICD-10-CM | POA: Diagnosis not present

## 2021-03-02 DIAGNOSIS — I5030 Unspecified diastolic (congestive) heart failure: Secondary | ICD-10-CM | POA: Diagnosis not present

## 2021-03-02 DIAGNOSIS — I890 Lymphedema, not elsewhere classified: Secondary | ICD-10-CM | POA: Diagnosis not present

## 2021-03-02 DIAGNOSIS — I5022 Chronic systolic (congestive) heart failure: Secondary | ICD-10-CM | POA: Diagnosis not present

## 2021-03-02 DIAGNOSIS — R17 Unspecified jaundice: Secondary | ICD-10-CM | POA: Diagnosis not present

## 2021-03-02 DIAGNOSIS — E440 Moderate protein-calorie malnutrition: Secondary | ICD-10-CM | POA: Diagnosis not present

## 2021-03-02 DIAGNOSIS — N179 Acute kidney failure, unspecified: Secondary | ICD-10-CM | POA: Diagnosis not present

## 2021-03-02 DIAGNOSIS — Z8679 Personal history of other diseases of the circulatory system: Secondary | ICD-10-CM | POA: Diagnosis not present

## 2021-03-02 DIAGNOSIS — R531 Weakness: Secondary | ICD-10-CM | POA: Diagnosis not present

## 2021-03-02 DIAGNOSIS — R2689 Other abnormalities of gait and mobility: Secondary | ICD-10-CM | POA: Diagnosis not present

## 2021-03-02 DIAGNOSIS — E559 Vitamin D deficiency, unspecified: Secondary | ICD-10-CM | POA: Diagnosis not present

## 2021-03-02 DIAGNOSIS — I6389 Other cerebral infarction: Secondary | ICD-10-CM | POA: Diagnosis not present

## 2021-03-02 DIAGNOSIS — I509 Heart failure, unspecified: Secondary | ICD-10-CM | POA: Diagnosis not present

## 2021-03-02 DIAGNOSIS — I10 Essential (primary) hypertension: Secondary | ICD-10-CM | POA: Diagnosis not present

## 2021-03-02 DIAGNOSIS — E119 Type 2 diabetes mellitus without complications: Secondary | ICD-10-CM | POA: Diagnosis not present

## 2021-03-02 DIAGNOSIS — R262 Difficulty in walking, not elsewhere classified: Secondary | ICD-10-CM | POA: Diagnosis not present

## 2021-03-02 DIAGNOSIS — R001 Bradycardia, unspecified: Secondary | ICD-10-CM | POA: Diagnosis not present

## 2021-03-02 DIAGNOSIS — F17200 Nicotine dependence, unspecified, uncomplicated: Secondary | ICD-10-CM | POA: Diagnosis not present

## 2021-03-02 DIAGNOSIS — I350 Nonrheumatic aortic (valve) stenosis: Secondary | ICD-10-CM | POA: Diagnosis not present

## 2021-03-02 DIAGNOSIS — R41841 Cognitive communication deficit: Secondary | ICD-10-CM | POA: Diagnosis not present

## 2021-03-02 DIAGNOSIS — E612 Magnesium deficiency: Secondary | ICD-10-CM | POA: Diagnosis not present

## 2021-03-02 DIAGNOSIS — I5031 Acute diastolic (congestive) heart failure: Secondary | ICD-10-CM | POA: Diagnosis not present

## 2021-03-02 DIAGNOSIS — I4891 Unspecified atrial fibrillation: Secondary | ICD-10-CM | POA: Diagnosis not present

## 2021-03-02 DIAGNOSIS — I480 Paroxysmal atrial fibrillation: Secondary | ICD-10-CM | POA: Diagnosis not present

## 2021-03-02 DIAGNOSIS — Z794 Long term (current) use of insulin: Secondary | ICD-10-CM | POA: Diagnosis not present

## 2021-03-02 DIAGNOSIS — I959 Hypotension, unspecified: Secondary | ICD-10-CM | POA: Diagnosis not present

## 2021-03-02 DIAGNOSIS — J9600 Acute respiratory failure, unspecified whether with hypoxia or hypercapnia: Secondary | ICD-10-CM | POA: Diagnosis not present

## 2021-03-02 DIAGNOSIS — K648 Other hemorrhoids: Secondary | ICD-10-CM | POA: Diagnosis not present

## 2021-03-02 DIAGNOSIS — E11649 Type 2 diabetes mellitus with hypoglycemia without coma: Secondary | ICD-10-CM | POA: Diagnosis not present

## 2021-03-02 DIAGNOSIS — Z7401 Bed confinement status: Secondary | ICD-10-CM | POA: Diagnosis not present

## 2021-03-02 DIAGNOSIS — E1151 Type 2 diabetes mellitus with diabetic peripheral angiopathy without gangrene: Secondary | ICD-10-CM | POA: Diagnosis not present

## 2021-03-02 DIAGNOSIS — I639 Cerebral infarction, unspecified: Secondary | ICD-10-CM | POA: Diagnosis not present

## 2021-03-02 DIAGNOSIS — J440 Chronic obstructive pulmonary disease with acute lower respiratory infection: Secondary | ICD-10-CM | POA: Diagnosis not present

## 2021-03-02 DIAGNOSIS — E568 Deficiency of other vitamins: Secondary | ICD-10-CM | POA: Diagnosis not present

## 2021-03-02 DIAGNOSIS — B351 Tinea unguium: Secondary | ICD-10-CM | POA: Diagnosis not present

## 2021-03-02 DIAGNOSIS — D631 Anemia in chronic kidney disease: Secondary | ICD-10-CM | POA: Diagnosis not present

## 2021-03-02 DIAGNOSIS — H539 Unspecified visual disturbance: Secondary | ICD-10-CM | POA: Diagnosis not present

## 2021-03-02 DIAGNOSIS — Z8673 Personal history of transient ischemic attack (TIA), and cerebral infarction without residual deficits: Secondary | ICD-10-CM | POA: Diagnosis not present

## 2021-03-02 DIAGNOSIS — R278 Other lack of coordination: Secondary | ICD-10-CM | POA: Diagnosis not present

## 2021-03-02 DIAGNOSIS — R1311 Dysphagia, oral phase: Secondary | ICD-10-CM | POA: Diagnosis not present

## 2021-03-02 DIAGNOSIS — R5381 Other malaise: Secondary | ICD-10-CM | POA: Diagnosis not present

## 2021-03-02 DIAGNOSIS — E8770 Fluid overload, unspecified: Secondary | ICD-10-CM | POA: Diagnosis not present

## 2021-03-02 LAB — BASIC METABOLIC PANEL
Anion gap: 11 (ref 5–15)
BUN: 37 mg/dL — ABNORMAL HIGH (ref 8–23)
CO2: 34 mmol/L — ABNORMAL HIGH (ref 22–32)
Calcium: 8.8 mg/dL — ABNORMAL LOW (ref 8.9–10.3)
Chloride: 90 mmol/L — ABNORMAL LOW (ref 98–111)
Creatinine, Ser: 2.31 mg/dL — ABNORMAL HIGH (ref 0.61–1.24)
GFR, Estimated: 29 mL/min — ABNORMAL LOW (ref >60)
Glucose, Bld: 111 mg/dL — ABNORMAL HIGH (ref 70–99)
Potassium: 3.3 mmol/L — ABNORMAL LOW (ref 3.5–5.1)
Sodium: 135 mmol/L (ref 135–145)

## 2021-03-02 LAB — CBC WITH DIFFERENTIAL/PLATELET
Abs Immature Granulocytes: 0.03 10*3/uL (ref 0.00–0.07)
Basophils Absolute: 0 10*3/uL (ref 0.0–0.1)
Basophils Relative: 1 %
Eosinophils Absolute: 0.2 10*3/uL (ref 0.0–0.5)
Eosinophils Relative: 2 %
HCT: 27.2 % — ABNORMAL LOW (ref 39.0–52.0)
Hemoglobin: 8.2 g/dL — ABNORMAL LOW (ref 13.0–17.0)
Immature Granulocytes: 0 %
Lymphocytes Relative: 21 %
Lymphs Abs: 1.4 10*3/uL (ref 0.7–4.0)
MCH: 23.9 pg — ABNORMAL LOW (ref 26.0–34.0)
MCHC: 30.1 g/dL (ref 30.0–36.0)
MCV: 79.3 fL — ABNORMAL LOW (ref 80.0–100.0)
Monocytes Absolute: 0.7 10*3/uL (ref 0.1–1.0)
Monocytes Relative: 10 %
Neutro Abs: 4.5 10*3/uL (ref 1.7–7.7)
Neutrophils Relative %: 66 %
Platelets: 247 10*3/uL (ref 150–400)
RBC: 3.43 MIL/uL — ABNORMAL LOW (ref 4.22–5.81)
RDW: 19.3 % — ABNORMAL HIGH (ref 11.5–15.5)
WBC: 6.7 10*3/uL (ref 4.0–10.5)
nRBC: 0 % (ref 0.0–0.2)

## 2021-03-02 LAB — PROTEIN, BODY FLUID (OTHER): Total Protein, Body Fluid Other: 1.7 g/dL

## 2021-03-02 LAB — ACID FAST SMEAR (AFB, MYCOBACTERIA): Acid Fast Smear: NEGATIVE

## 2021-03-02 LAB — GLUCOSE, CAPILLARY
Glucose-Capillary: 107 mg/dL — ABNORMAL HIGH (ref 70–99)
Glucose-Capillary: 121 mg/dL — ABNORMAL HIGH (ref 70–99)

## 2021-03-02 LAB — RESP PANEL BY RT-PCR (FLU A&B, COVID) ARPGX2
Influenza A by PCR: NEGATIVE
Influenza B by PCR: NEGATIVE
SARS Coronavirus 2 by RT PCR: NEGATIVE

## 2021-03-02 MED ORDER — APIXABAN 2.5 MG PO TABS: 2.5000 mg | ORAL_TABLET | Freq: Two times a day (BID) | ORAL | Status: DC

## 2021-03-02 MED ORDER — LANTUS SOLOSTAR 100 UNIT/ML ~~LOC~~ SOPN: 15.0000 [IU] | PEN_INJECTOR | Freq: Every day | SUBCUTANEOUS | 11 refills | Status: AC

## 2021-03-02 MED ORDER — METOLAZONE 2.5 MG PO TABS: 2.5000 mg | ORAL_TABLET | Freq: Every day | ORAL | Status: DC

## 2021-03-02 NOTE — Progress Notes (Signed)
Report to called [facility]; Shellia Carwin, nurse.

## 2021-03-02 NOTE — Discharge Summary (Signed)
[name] HFW:[id number] DOB: 10-Jul-1946 DOA: 02/19/2021  PCP: [name], MD  Admit date: 02/19/2021 Discharge date: 03/02/2021  Time spent: 35 minutes  Recommendations for Outpatient Follow-up:  1. Check bmp in 1 week and cbc in 1 week 2. F/u pleural fluid culture and cytology 3. F/u dr Ubaldo Glassing cardiology 1 week 4. Needs outpatient dental f/u for dental infection which was treated with antibiotics    Discharge Diagnoses:  Principal Problem:   AKI (acute kidney injury) ([facility]) Active Problems:   Chronic kidney disease (CKD) stage G3b/A3, moderately decreased glomerular filtration rate (GFR) between 30-44 mL/min/1.73 square meter and albuminuria creatinine ratio greater than 300 mg/g (HCC)   History of CVA (cerebrovascular accident)   Chronic diastolic CHF (congestive heart failure) (HCC)   Atrial fibrillation, chronic (HCC)   Type 2 diabetes mellitus with hypoglycemia without coma (HCC)   Bradycardia   Weakness generalized   Dental infection   Pressure injury of skin   Discharge Condition: fair  Diet recommendation: heart healthy  Filed Weights   02/28/21 0915 03/01/21 0500 03/02/21 0507  Weight: 82.8 kg 80.4 kg 72.6 kg    History of present illness:  [name] a 75 y.o.malewith medical history significant forchronic atrial fibrillation, history of CHF, 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 Patient states that he has had weakness for the last 3 days which has made it difficult for him to ambulate. This morning he states he was unable to get off the toilet and actually had a fall this morning. He denies feeling dizzy or lightheaded and complains of just generalized weakness. He denies having any chest pain, no shortness of breath, no fever, no chills, no headache, no  abdominal pain, no nausea, no vomiting, no diarrhea, no urinary symptoms, no blurred vision, no difficulty swallowing, no headache.  Found to be bradycardic, hypoglycemic, borderline hypotensive.  Found to have significant AKI thought to be due to overdiuresis.  Case discussed with cardiology and nephrology.  Recommendations appreciated.  Hospital Course:  Acute kidney injury on chronic kidney disease stage IIIb  Acute tubular necrosis Developed edema on chest x-ray At baseline patient has a serum creatinine of 1.95, onadmission his creatinine is 3.95, downtrended to low 2s.  Nephrology consulted, have been following for course of admission. Normal EF and diastolic parameters on January 2022. Plan: No IV fluids Lasix gtt discontinued, torsemide 40 started Added metolazone 2.5 mg daily Will need bmp check in 1 week  Acute on chronic anemia Dark stools Hb drifting down to 7 on 4/22. Received one unit. Since then stable 8-9. GI following, declining intervention, unclear if true GIB. hgb stable since - resume eliquis and oral protonix - check cbc 1 week - po iron held  Atrial fibrillation with slow ventricular response Chronic issue per Dr. Ubaldo Glassing who the patient is well-known to Plan: Avoid AV nodal blocking agents - holding apixaban as above, restart tomorrow if hgb stable  Diabetes mellitus with complications of stage III chronic kidney disease Mild elevations - re-started home lantus at lower dose of 15  COPD with acute on chronic respiratory failure Not acutely exacerbated. Baseline 2 L, here requiring 4-5 and stable.  - Continued Spiriva, as needed bronchodilator therapy and inhaled steroids - O2 - Home CPAP when sleeping  Pleural effusion Bilateral. Transudate. 800 cc removed. Likely  2/2 volume overload - monitor, f/u culture and cytology  Acute on chronic diastolic congestive heart failure S/p lasix gtt - torsemide daily ordered  Coronary artery  disease Stable and not acutely exacerbated Continue statins and aspirin Hold nitrates  Generalized weakness/fall  Pt/ot followed, d/c to snf  Chronic pain - holding home gabapentin/oxycodone has not requested  Dental infection Treated with antibiotics. ENT reviewed, thinks this likely a dental infection - will need outpatient f/u  Procedures:  none   Consultations:  Cardiology, nephrology  Discharge Exam: Vitals:   03/02/21 0911 03/02/21 1152  BP: (!) 158/59 (!) 139/54  Pulse: (!) 48 (!) 49  Resp: 18 17  Temp: 97.9 F (36.6 C) 98 F (36.7 C)  SpO2: 100% 100%    General exam: No acute distress Respiratory system: Scattered crackles bilaterally.  Normal work of breathing.  5 L cardiovascular system: S1-S2, regular rate and rhythm, no murmurs, 1+ pedal edema  gastrointestinal system: Abdomen is nondistended, soft and nontender. No organomegaly or masses felt. Normal bowel sounds heard. Central nervous system: Alert, oriented x2, no focal deficits, lethargic extremities: Symmetric 5 x 5 power. Skin: No rashes, lesions or ulcers Psychiatry: Judgement and insight appear impaired. Mood & affect flattened.    Discharge Instructions    Allergies as of 03/02/2021      Reactions   Lactose Intolerance (gi)       Medication List    STOP taking these medications   amLODipine 5 MG tablet Commonly known as: NORVASC   hydrALAZINE 50 MG tablet Commonly known as: APRESOLINE   isosorbide mononitrate 30 MG 24 hr tablet Commonly known as: IMDUR   losartan 25 MG tablet Commonly known as: COZAAR   potassium chloride SA 20 MEQ tablet Commonly known as: KLOR-CON     TAKE these medications   acetaminophen 325 MG tablet Commonly known as: TYLENOL Take 2 tablets (650 mg total) by mouth every 6 (six) hours as needed for mild pain (or Fever >/= 101).   albuterol 108 (90 Base) MCG/ACT inhaler Commonly known as: VENTOLIN HFA Inhale 2 puffs into the lungs every 6 (six)  hours as needed for wheezing or shortness of breath.   apixaban 2.5 MG Tabs tablet Commonly known as: ELIQUIS Take 1 tablet (2.5 mg total) by mouth 2 (two) times daily. What changed:   medication strength  how much to take   bifidobacterium infantis capsule Take 1 capsule by mouth every morning.   cholecalciferol 1000 units tablet Commonly known as: VITAMIN D Take 1,000 Units by mouth daily.   Cinnamon 500 MG capsule Take 1,000 mg by mouth 2 (two) times daily.   Combivent Respimat 20-100 MCG/ACT Aers respimat Generic drug: Ipratropium-Albuterol Inhale 2 puffs into the lungs 4 (four) times daily as needed.   CRANBERRY EXTRACT PO Take 500 mg by mouth every morning.   Ensure Max Protein Liqd Take 330 mLs (11 oz total) by mouth 2 (two) times daily between meals.   gabapentin 100 MG capsule Commonly known as: NEURONTIN Take 100 mg by mouth 2 (two) times daily.   glucose 4 GM chewable tablet Chew 1 tablet by mouth as needed for low blood sugar.   insulin aspart 100 UNIT/ML injection Commonly known as: novoLOG 0-9 Units, Subcutaneous, 3 times daily with meals CBG < 70: Implement Hypoglycemia protocol/measures CBG 70 - 120: 0 units CBG 121 - 150: 1 unit CBG 151 - 200: 2 units CBG 201 - 250: 3 units CBG 251 - 300: 5 units CBG  301 - 350: 7 units CBG 351 - 400: 9 units CBG > 400: call MD What changed:   how much to take  how to take this  when to take this   iron polysaccharides 150 MG capsule Commonly known as: NIFEREX Take 1 capsule (150 mg total) by mouth daily. Can take any over-the-counter iron supplement.   lactase 3000 units tablet Commonly known as: LACTAID Take 3,000 Units by mouth 3 (three) times daily between meals as needed.   Lantus SoloStar 100 UNIT/ML Solostar Pen Generic drug: insulin glargine Inject 15 Units into the skin daily at 10 pm. What changed:   how much to take  additional instructions   magnesium oxide 400 MG tablet Commonly  known as: MAG-OX Take 400 mg by mouth daily.   metolazone 2.5 MG tablet Commonly known as: ZAROXOLYN Take 1 tablet (2.5 mg total) by mouth daily. Start taking on: Mar 03, 2021   multivitamin with minerals Tabs tablet Take 1 tablet by mouth daily.   multivitamin-lutein Caps capsule Take 1 capsule by mouth daily.   nicotine 21 mg/24hr patch Commonly known as: NICODERM CQ - dosed in mg/24 hours Place 1 patch (21 mg total) onto the skin daily for 28 days.   omeprazole 20 MG capsule Commonly known as: PRILOSEC Take 20 mg by mouth daily.   simvastatin 20 MG tablet Commonly known as: ZOCOR Take 20 mg by mouth in the morning.   Spiriva HandiHaler 18 MCG inhalation capsule Generic drug: tiotropium Place 1 capsule (18 mcg total) into inhaler and inhale daily.   SUPER B COMPLEX/C PO Take 1 tablet by mouth every morning.   torsemide 20 MG tablet Commonly known as: DEMADEX Take 40 mg by mouth daily.   traZODone 50 MG tablet Commonly known as: DESYREL Take 50 mg by mouth at bedtime.   vitamin C 500 MG tablet Commonly known as: ASCORBIC ACID Take 500 mg by mouth 2 (two) times daily.      Allergies  Allergen Reactions  . Lactose Intolerance (Gi)     Contact information for follow-up providers    Teodoro Spray, MD Follow up in 1 week(s).   Specialty: Cardiology Contact information: [address] [place] [address] [phone]            Contact information for after-discharge care    Destination    [facility] .   Service: Skilled Nursing Contact information: [address] [phone]                   The results of significant diagnostics from this hospitalization (including imaging, microbiology, ancillary and laboratory) are listed below for reference.    Significant Diagnostic Studies: DG Chest 2 View  Result Date: 02/19/2021 CLINICAL DATA:  Weakness. EXAM: CHEST - 2  VIEW COMPARISON:  02/02/2021 FINDINGS: Midline trachea. Moderate cardiomegaly. Trace left pleural fluid or thickening. No pneumothorax. Interstitial thickening is decreased compared to the prior exam. No lobar consolidation. IMPRESSION: Cardiomegaly with decreased pulmonary interstitial thickening. Likely related to COPD/chronic bronchitis. No overt congestive failure. Similar trace left pleural fluid or thickening. Electronically Signed   By: Abigail Miyamoto M.D.   On: 02/19/2021 09:51   CT Head Wo Contrast  Result Date: 02/19/2021 CLINICAL DATA:  Neck trauma. Generalized weakness for 2-3 days. History of stroke. EXAM: CT HEAD WITHOUT CONTRAST CT CERVICAL SPINE WITHOUT CONTRAST TECHNIQUE: Multidetector CT imaging of the head and cervical spine was performed  following the standard protocol without intravenous contrast. Multiplanar CT image reconstructions of the cervical spine were also generated. COMPARISON:  MRI brain 08/08/2016 FINDINGS: CT HEAD FINDINGS Brain: No evidence of acute infarction, hemorrhage, hydrocephalus, extra-axial collection or mass lesion/mass effect. There is mild diffuse low-attenuation within the subcortical and periventricular white matter compatible with chronic microvascular disease. Remote lacunar infarct in the left cerebellar hemisphere identified. Prominence of the sulci and ventricles compatible with brain atrophy. Vascular: No hyperdense vessel or unexpected calcification. Skull: Normal. Negative for fracture or focal lesion. Sinuses/Orbits: Paranasal sinuses appear clear. Mastoid air cells are clear. Other: There is a lucent bone lesion with pathologic fracture involving the maxilla measuring approximately 2.3 x 1.2 cm, image 9/3. This is only partially visualized. CT CERVICAL SPINE FINDINGS Alignment: Normal Skull base and vertebrae: The vertebral body heights are well maintained. No fractures identified. Soft tissues and spinal canal: No prevertebral fluid or swelling. No  visible canal hematoma. Disc levels: There is partial fusion of C4-5. Marked disc space narrowing, endplate sclerosis and ventral and dorsal osteophyte formation identified at C6-7. Upper chest: Pleural thickening versus small effusion noted within the posterior right chest. Other: None IMPRESSION: 1. No acute intracranial abnormality. 2. 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 3. No evidence for cervical spine fracture. 4. Marked degenerative disc disease identified C6-7 5. Expansile lucent lesion with pathologic fracture is identified involving the midline the maxilla. Recommend further evaluation with dedicated maxillofacial CT with contrast. Electronically Signed   By: Kerby Moors M.D.   On: 02/19/2021 09:38   CT CHEST WO CONTRAST  Result Date: 02/02/2021 CLINICAL DATA:  Respiratory failure EXAM: CT CHEST WITHOUT CONTRAST TECHNIQUE: Multidetector CT imaging of the chest was performed following the standard protocol without IV contrast. COMPARISON:  Chest radiograph 02/01/2021 FINDINGS: Cardiovascular: Coronary artery calcification and aortic atherosclerotic calcification. Post CABG Mediastinum/Nodes: No axillary supraclavicular adenopathy. Enlarged mediastinal lymph nodes are present. For example 17 mm RIGHT lower paratracheal node. 16 mm subcarinal node. Probable mild hilar adenopathy on the RIGHT. Lungs/Pleura: Respiratory motion degrades the lung imaging. Centrilobular emphysema the upper lobes. There bilateral small to moderate pleural effusions greater on the RIGHT. There is bibasilar passive atelectasis. Loculated pleural fluid at the LEFT lung base. There is diffuse ground-glass densities in the RIGHT lower lobe suggesting pulmonary infection or edema. Small amount of mucoid material in the trachea at the level of the carina. Upper Abdomen: Limited view of the liver, kidneys, pancreas are unremarkable. Normal adrenal glands. Musculoskeletal: Midline sternotomy. IMPRESSION: 1.  Bilateral small to moderate pleural effusions greater on the RIGHT. 2. Bibasilar atelectasis. Round atelectasis and loculated fluid at the LEFT lung base. 3. Diffuse ground-glass densities in the RIGHT lower lobe suggest early pneumonia, asymmetric edema or pneumonitis. No focal consolidation. 4. Centrilobular emphysema the upper lobes. 5. Mild mediastinal adenopathy is favored reactive related to COPD. Electronically Signed   By: Suzy Bouchard M.D.   On: 02/02/2021 11:44   CT Cervical Spine Wo Contrast  Result Date: 02/19/2021 CLINICAL DATA:  Neck trauma. Generalized weakness for 2-3 days. History of stroke. EXAM: CT HEAD WITHOUT CONTRAST CT CERVICAL SPINE WITHOUT CONTRAST TECHNIQUE: Multidetector CT imaging of the head and cervical spine was performed following the standard protocol without intravenous contrast. Multiplanar CT image reconstructions of the cervical spine were also generated. COMPARISON:  MRI brain 08/08/2016 FINDINGS: CT HEAD FINDINGS Brain: No evidence of acute infarction, hemorrhage, hydrocephalus, extra-axial collection or mass lesion/mass effect. There is mild diffuse low-attenuation within the  subcortical and periventricular white matter compatible with chronic microvascular disease. Remote lacunar infarct in the left cerebellar hemisphere identified. Prominence of the sulci and ventricles compatible with brain atrophy. Vascular: No hyperdense vessel or unexpected calcification. Skull: Normal. Negative for fracture or focal lesion. Sinuses/Orbits: Paranasal sinuses appear clear. Mastoid air cells are clear. Other: There is a lucent bone lesion with pathologic fracture involving the maxilla measuring approximately 2.3 x 1.2 cm, image 9/3. This is only partially visualized. CT CERVICAL SPINE FINDINGS Alignment: Normal Skull base and vertebrae: The vertebral body heights are well maintained. No fractures identified. Soft tissues and spinal canal: No prevertebral fluid or swelling. No  visible canal hematoma. Disc levels: There is partial fusion of C4-5. Marked disc space narrowing, endplate sclerosis and ventral and dorsal osteophyte formation identified at C6-7. Upper chest: Pleural thickening versus small effusion noted within the posterior right chest. Other: None IMPRESSION: 1. No acute intracranial abnormality. 2. 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 3. No evidence for cervical spine fracture. 4. Marked degenerative disc disease identified C6-7 5. Expansile lucent lesion with pathologic fracture is identified involving the midline the maxilla. Recommend further evaluation with dedicated maxillofacial CT with contrast. Electronically Signed   By: Kerby Moors M.D.   On: 02/19/2021 09:38   US RENAL  Result Date: 02/20/2021 CLINICAL DATA:  Acute renal failure.  Chronic renal disease. EXAM: RENAL / URINARY TRACT ULTRASOUND COMPLETE COMPARISON:  No recent prior. FINDINGS: Right Kidney: Renal measurements: 9.8 x 4.5 x 4.0 cm = volume: 91.2 mL. Mild increased echogenicity. No mass or hydronephrosis visualized. Left Kidney: Renal measurements: 9.4 x 5.3 x 5.1 cm = volume: 132.8 mL. Mild increased echogenicity. No mass or hydronephrosis visualized. Bladder: Mild bladder distention cannot be excluded. Other: Small right pleural effusion cannot be excluded. IMPRESSION: 1. Mild increased echogenicity both kidneys suggesting chronic medical renal disease. No acute renal abnormality identified. 2.  Bladder distention cannot be excluded. 3.  Small right pleural effusion cannot be excluded. Electronically Signed   By: Marcello Moores  Register   On: 02/20/2021 13:37   DG Chest Port 1 View  Result Date: 03/01/2021 CLINICAL DATA:  Status post thoracentesis EXAM: PORTABLE CHEST 1 VIEW COMPARISON:  02/02/2021 FINDINGS: Bilateral diffuse interstitial thickening. Small left pleural effusion. No pneumothorax. Stable cardiomegaly. Prior CABG. No acute osseous abnormality. IMPRESSION: 1. Small  left pleural effusion. No pneumothorax. 2. Mild pulmonary edema. Electronically Signed   By: Kathreen Devoid   On: 03/01/2021 16:15   DG Chest Port 1 View  Result Date: 03/01/2021 CLINICAL DATA:  Weakness. EXAM: PORTABLE CHEST 1 VIEW COMPARISON:  February 26, 2021. FINDINGS: Stable cardiomegaly. Status post coronary bypass graft. Central pulmonary vascular congestion is noted with bibasilar opacities concerning for edema and associated pleural effusions. No pneumothorax is noted. Bony thorax is unremarkable. IMPRESSION: Stable cardiomegaly with central pulmonary vascular congestion and probable bibasilar pulmonary edema and pleural effusions. Aortic Atherosclerosis (ICD10-I70.0). Electronically Signed   By: Marijo Conception M.D.   On: 03/01/2021 11:33   DG Chest Port 1 View  Result Date: 02/26/2021 CLINICAL DATA:  Hypoxia. EXAM: PORTABLE CHEST 1 VIEW COMPARISON:  02/22/2021.  CT 02/02/2021. FINDINGS: Prior CABG. Cardiomegaly. Diffuse bilateral interstitial prominence consistent with interstitial edema and or pneumonitis. Small left pleural effusion again noted. No pneumothorax. Chest is unchanged from prior exam. IMPRESSION: Prior CABG. Cardiomegaly. Diffuse bilateral interstitial prominence consistent interstitial edema and or pneumonitis again noted without interim change. Small left pleural effusion again noted without interim change. Electronically  Signed   By: Marcello Moores  Register   On: 02/26/2021 07:50   DG Chest Port 1 View  Result Date: 02/22/2021 CLINICAL DATA:  Pulmonary edema EXAM: PORTABLE CHEST 1 VIEW COMPARISON:  Yesterday FINDINGS: Generalized interstitial opacity with small pleural effusions. Stable lung volumes with diaphragm flattening. Cardiomegaly. Prior CABG. No pneumothorax IMPRESSION: 1. Unchanged CHF pattern. 2. COPD Electronically Signed   By: Monte Fantasia M.D.   On: 02/22/2021 04:46   DG Chest Port 1 View  Result Date: 02/21/2021 CLINICAL DATA:  Shortness of breath, weakness,  history COPD, hypertension, diabetes mellitus, stroke, atrial fibrillation, smoker, CHF, stage III chronic kidney disease EXAM: PORTABLE CHEST 1 VIEW COMPARISON:  Portable exam 0821 hours compared to 02/19/2021 FINDINGS: Enlargement of cardiac silhouette with vascular congestion post CABG. Mediastinal contours normal. Atherosclerotic calcification aorta. Interstitial infiltrates in both lungs increased since previous exam favor pulmonary edema though multifocal infection not completely excluded. Tiny LEFT pleural effusion. No pneumothorax or acute osseous findings. IMPRESSION: Enlargement of cardiac silhouette with pulmonary vascular congestion post CABG. Diffuse infiltrates increased from prior exam favor pulmonary edema/CHF. Tiny LEFT pleural effusion. Aortic Atherosclerosis (ICD10-I70.0). Electronically Signed   By: Lavonia Dana M.D.   On: 02/21/2021 10:17   DG Chest Port 1 View  Result Date: 02/02/2021 CLINICAL DATA:  Acute respiratory failure EXAM: PORTABLE CHEST 1 VIEW COMPARISON:  Yesterday FINDINGS: Cardiomegaly. Prior CABG. Generous lung volumes. Diffuse interstitial opacity with cephalized blood flow and Kerley lines. The degree of opacity of is above prior baseline. Small bilateral pleural effusion. IMPRESSION: CHF pattern superimposed on a background of chronic lung disease. Electronically Signed   By: Monte Fantasia M.D.   On: 02/02/2021 11:15   DG Chest Port 1 View  Result Date: 02/01/2021 CLINICAL DATA:  Respiratory distress EXAM: PORTABLE CHEST 1 VIEW COMPARISON:  01/28/2021, 11/15/2020, 10/25/2020, 09/26/2019 FINDINGS: Post sternotomy changes. Diffuse bilateral interstitial opacity, some of which is felt secondary to chronic disease. There is slight increased ground-glass opacity bilaterally suspicious for superimposed acute edema or possible pneumonia. Blunting at the left costophrenic sulcus probably chronic and due to scarring. Mild cardiomegaly with aortic atherosclerosis. No pneumothorax.  IMPRESSION: Suspect some degree of chronic interstitial changes. However there is increased interstitial and ground-glass opacity bilaterally suspicious for superimposed acute edema or possible pneumonia. Mild cardiomegaly. Electronically Signed   By: Donavan Foil M.D.   On: 02/01/2021 15:35   US THORACENTESIS ASP PLEURAL SPACE W/IMG GUIDE  Result Date: 03/01/2021 INDICATION: Patient history atrial fibrillation, CHF, COPD, and chronic kidney disease. Presents the ED at for generalized weakness. Found to have a right-sided pleural effusion. Request is for therapeutic and diagnostic thoracentesis EXAM: ULTRASOUND GUIDED THERAPEUTIC AND DIAGNOSTIC RIGHT SIDED THORACENTESIS MEDICATIONS: Lidocaine 1% 10 mL COMPLICATIONS: None immediate. PROCEDURE: An ultrasound guided thoracentesis was thoroughly discussed with the patient and questions answered. The benefits, risks, alternatives and complications were also discussed. The patient understands and wishes to proceed with the procedure. Written consent was obtained. Ultrasound was performed to localize and mark an adequate pocket of fluid in the right chest. The area was then prepped and draped in the normal sterile fashion. 1% Lidocaine was used for local anesthesia. Under ultrasound guidance a 6 Fr Safe-T-Centesis catheter was introduced. Thoracentesis was performed. The catheter was removed and a dressing applied. FINDINGS: A total of approximately 800 mL of straw-colored fluid was removed. Samples were sent to the laboratory as requested by the clinical team. IMPRESSION: Successful ultrasound guided diagnostic and therapeutic right-sided thoracentesis yielding 800 mL  of pleural fluid. Read by: Rushie Nyhan, NP Electronically Signed   By: Miachel Roux M.D.   On: 03/01/2021 16:05    Microbiology: Recent Results (from the past 240 hour(s))  Body fluid culture w Gram Stain     Status: None (Preliminary result)   Collection Time: 03/01/21  3:40 PM    Specimen: PATH Cytology Pleural fluid  Result Value Ref Range Status   Specimen Description   Final    PLEURAL Performed at [facility], [address] [place], [place] [address]    Special Requests   Final    NONE Performed at [facility], [address] [place], [place] [address]    Gram Stain   Final    FEW WBC PRESENT, PREDOMINANTLY MONONUCLEAR NO ORGANISMS SEEN Performed at [facility], [place] [address], [place], [place] [address]    Culture PENDING  Incomplete   Report Status PENDING  Incomplete  Resp Panel by RT-PCR (Flu A&B, Covid) Nasopharyngeal Swab     Status: None   Collection Time: 03/02/21 11:25 AM   Specimen: Nasopharyngeal Swab; Nasopharyngeal(NP) swabs in vial transport medium  Result Value Ref Range Status   SARS Coronavirus 2 by RT PCR NEGATIVE NEGATIVE Final    Comment: (NOTE) SARS-CoV-2 target nucleic acids are NOT DETECTED.  The SARS-CoV-2 RNA is generally detectable in upper respiratory specimens during the acute phase of infection. The lowest concentration of SARS-CoV-2 viral copies this assay can detect is 138 copies/mL. A negative result does not preclude SARS-Cov-2 infection and should not be used as the sole basis for treatment or other patient management decisions. A negative result may occur with  improper specimen collection/handling, submission of specimen other than nasopharyngeal swab, presence of viral mutation(s) within the areas targeted by this assay, and inadequate number of viral copies(<138 copies/mL). A negative result must be combined with clinical observations, patient history, and epidemiological information. The expected result is Negative.  Fact Sheet for Patients:  EntrepreneurPulse.com.au  Fact Sheet for Healthcare Providers:  IncredibleEmployment.be  This test is no t yet approved or cleared by the [place] FDA and  has been authorized for detection  and/or diagnosis of SARS-CoV-2 by FDA under an Emergency Use Authorization (EUA). This EUA will remain  in effect (meaning this test can be used) for the duration of the COVID-19 declaration under Section 564(b)(1) of the Act, 21 U.S.C.section 360bbb-3(b)(1), unless the authorization is terminated  or revoked sooner.       Influenza A by PCR NEGATIVE NEGATIVE Final   Influenza B by PCR NEGATIVE NEGATIVE Final    Comment: (NOTE) The Xpert Xpress SARS-CoV-2/FLU/RSV plus assay is intended as an aid in the diagnosis of influenza from Nasopharyngeal swab specimens and should not be used as a sole basis for treatment. Nasal washings and aspirates are unacceptable for Xpert Xpress SARS-CoV-2/FLU/RSV testing.  Fact Sheet for Patients: EntrepreneurPulse.com.au  Fact Sheet for Healthcare Providers: IncredibleEmployment.be  This test is not yet approved or cleared by the [place] FDA and has been authorized for detection and/or diagnosis of SARS-CoV-2 by FDA under an Emergency Use Authorization (EUA). This EUA will remain in effect (meaning this test can be used) for the duration of the COVID-19 declaration under Section 564(b)(1) of the Act, 21 U.S.C. section 360bbb-3(b)(1), unless the authorization is terminated or revoked.  Performed at [facility], [address], [place], [place] [address]      Labs: Basic Metabolic Panel: Recent Labs  Lab 02/23/21  1606 02/24/21 0419 02/26/21 0003 02/27/21 0558 02/28/21 0559 03/01/21 0534 03/02/21 0508  NA  --    < > 136 135 136 135 135  K  --    < > 4.3 3.8 3.5 3.6 3.3*  CL  --    < > 98 97* 93* 91* 90*  CO2  --    < > 27 28 32 32 34*  GLUCOSE  --    < > 219* 181* 94 158* 111*  BUN  --    < > 45* 40* 37* 42* 37*  CREATININE  --    < > 2.41* 2.21* 2.17* 2.34* 2.31*  CALCIUM  --    < > 8.2* 8.2* 8.7* 8.6* 8.8*  MG  --   --   --   --   --  1.9  --   PHOS 4.2  --   --   --   --   --    --    < > = values in this interval not displayed.   Liver Function Tests: Recent Labs  Lab 03/01/21 1324  AST 15  ALT 12  ALKPHOS 110  BILITOT 0.7  PROT 5.8*  ALBUMIN 3.0*   No results for input(s): LIPASE, AMYLASE in the last 168 hours. No results for input(s): AMMONIA in the last 168 hours. CBC: Recent Labs  Lab 02/26/21 0003 02/27/21 0558 02/28/21 0559 03/01/21 0534 03/02/21 0508  WBC 6.3 5.9 6.7 6.7 6.7  NEUTROABS 4.7 4.1 4.6 4.5 4.5  HGB 8.3* 8.0* 9.0* 8.1* 8.2*  HCT 27.0* 26.5* 29.4* 27.6* 27.2*  MCV 79.2* 79.1* 79.5* 78.6* 79.3*  PLT 236 232 252 247 247   Cardiac Enzymes: No results for input(s): CKTOTAL, CKMB, CKMBINDEX, TROPONINI in the last 168 hours. BNP: BNP (last 3 results) Recent Labs    11/16/20 0900 11/22/20 1345 01/28/21 0338  BNP 1,245.8* 796.1* 1,082.4*    ProBNP (last 3 results) No results for input(s): PROBNP in the last 8760 hours.  CBG: Recent Labs  Lab 03/01/21 1136 03/01/21 1656 03/01/21 2042 03/02/21 0910 03/02/21 1150  GLUCAP 192* 204* 196* 107* 121*       Signed:  Desma Maxim MD.  [facility] 03/02/2021, 1:02 PM

## 2021-03-02 NOTE — Progress Notes (Addendum)
[facility]  ROUNDING NOTE   Subjective:   [name] is a 75 y.o.male with past medical history of chronic Atrial Fibrillation, CHF, diabetes, CKD stage 3, GERD and hypertension. He presents to the ED with generalized weakness for 3 days.  Update Patient resting in bed, in no acute distress. Denies worsening SOB, nausea or vomiting. He continues to require 4L of supplemental O2 via Barry.   Objective:  Vital signs in last 24 hours:  Temp:  [97.9 F (36.6 C)-99.5 F (37.5 C)] 98 F (36.7 C) (04/30 1152) Pulse Rate:  [43-58] 49 (04/30 1152) Resp:  [16-20] 17 (04/30 1152) BP: (124-158)/(33-59) 139/54 (04/30 1152) SpO2:  [100 %] 100 % (04/30 1152) Weight:  [72.6 kg] 72.6 kg (04/30 0507)  Weight change: -10.2 kg Filed Weights   02/28/21 0915 03/01/21 0500 03/02/21 0507  Weight: 82.8 kg 80.4 kg 72.6 kg    Intake/Output: I/O last 3 completed shifts: In: 840 [P.O.:840] Out: 4100 [Urine:4100]   Intake/Output this shift:  Total I/O In: 240 [P.O.:240] Out: 800 [Urine:800]  Physical Exam: General: Resting in bed, in no acute distress  Head: Normocephalic, atraumati  Eyes: Anicteric  Lungs:  Respiration even, slightly labored, lungs diminished at the bases  Heart: S1S2, no rubs or gallops  Abdomen:  Soft, nontender, non distended  Extremities:  No peripheral edema.  Neurologic: Answering simple questions appropriately  Skin: No  Acute lesions or rashes    Basic Metabolic Panel: Recent Labs  Lab 02/23/21 1606 02/24/21 0419 02/26/21 0003 02/27/21 0558 02/28/21 0559 03/01/21 0534 03/02/21 0508  NA  --    < > 136 135 136 135 135  K  --    < > 4.3 3.8 3.5 3.6 3.3*  CL  --    < > 98 97* 93* 91* 90*  CO2  --    < > 27 28 32 32 34*  GLUCOSE  --    < > 219* 181* 94 158* 111*  BUN  --    < > 45* 40* 37* 42* 37*  CREATININE  --    < > 2.41* 2.21* 2.17* 2.34* 2.31*  CALCIUM  --    < > 8.2* 8.2* 8.7* 8.6* 8.8*  MG  --   --   --   --   --  1.9  --   PHOS 4.2  --    --   --   --   --   --    < > = values in this interval not displayed.    Liver Function Tests: Recent Labs  Lab 03/01/21 1324  AST 15  ALT 12  ALKPHOS 110  BILITOT 0.7  PROT 5.8*  ALBUMIN 3.0*   No results for input(s): LIPASE, AMYLASE in the last 168 hours. No results for input(s): AMMONIA in the last 168 hours.  CBC: Recent Labs  Lab 02/26/21 0003 02/27/21 0558 02/28/21 0559 03/01/21 0534 03/02/21 0508  WBC 6.3 5.9 6.7 6.7 6.7  NEUTROABS 4.7 4.1 4.6 4.5 4.5  HGB 8.3* 8.0* 9.0* 8.1* 8.2*  HCT 27.0* 26.5* 29.4* 27.6* 27.2*  MCV 79.2* 79.1* 79.5* 78.6* 79.3*  PLT 236 232 252 247 247    Cardiac Enzymes: No results for input(s): CKTOTAL, CKMB, CKMBINDEX, TROPONINI in the last 168 hours.  BNP: Invalid input(s): POCBNP  CBG: Recent Labs  Lab 03/01/21 1136 03/01/21 1656 03/01/21 2042 03/02/21 0910 03/02/21 1150  GLUCAP 192* 204* 196* 107* 121*    Microbiology: Results for orders  placed or performed during the hospital encounter of 02/19/21  Resp Panel by RT-PCR (Flu A&B, Covid) Nasopharyngeal Swab     Status: None   Collection Time: 02/19/21  9:56 AM   Specimen: Nasopharyngeal Swab; Nasopharyngeal(NP) swabs in vial transport medium  Result Value Ref Range Status   SARS Coronavirus 2 by RT PCR NEGATIVE NEGATIVE Final    Comment: (NOTE) SARS-CoV-2 target nucleic acids are NOT DETECTED.  The SARS-CoV-2 RNA is generally detectable in upper respiratory specimens during the acute phase of infection. The lowest concentration of SARS-CoV-2 viral copies this assay can detect is 138 copies/mL. A negative result does not preclude SARS-Cov-2 infection and should not be used as the sole basis for treatment or other patient management decisions. A negative result may occur with  improper specimen collection/handling, submission of specimen other than nasopharyngeal swab, presence of viral mutation(s) within the areas targeted by this assay, and inadequate number of  viral copies(<138 copies/mL). A negative result must be combined with clinical observations, patient history, and epidemiological information. The expected result is Negative.  Fact Sheet for Patients:  EntrepreneurPulse.com.au  Fact Sheet for Healthcare Providers:  IncredibleEmployment.be  This test is no t yet approved or cleared by the [place] FDA and  has been authorized for detection and/or diagnosis of SARS-CoV-2 by FDA under an Emergency Use Authorization (EUA). This EUA will remain  in effect (meaning this test can be used) for the duration of the COVID-19 declaration under Section 564(b)(1) of the Act, 21 U.S.C.section 360bbb-3(b)(1), unless the authorization is terminated  or revoked sooner.       Influenza A by PCR NEGATIVE NEGATIVE Final   Influenza B by PCR NEGATIVE NEGATIVE Final    Comment: (NOTE) The Xpert Xpress SARS-CoV-2/FLU/RSV plus assay is intended as an aid in the diagnosis of influenza from Nasopharyngeal swab specimens and should not be used as a sole basis for treatment. Nasal washings and aspirates are unacceptable for Xpert Xpress SARS-CoV-2/FLU/RSV testing.  Fact Sheet for Patients: EntrepreneurPulse.com.au  Fact Sheet for Healthcare Providers: IncredibleEmployment.be  This test is not yet approved or cleared by the [place] FDA and has been authorized for detection and/or diagnosis of SARS-CoV-2 by FDA under an Emergency Use Authorization (EUA). This EUA will remain in effect (meaning this test can be used) for the duration of the COVID-19 declaration under Section 564(b)(1) of the Act, 21 U.S.C. section 360bbb-3(b)(1), unless the authorization is terminated or revoked.  Performed at [facility], [address] [place], [place] [address]   Body fluid culture w Gram Stain     Status: None (Preliminary result)   Collection Time: 03/01/21  3:40 PM    Specimen: PATH Cytology Pleural fluid  Result Value Ref Range Status   Specimen Description   Final    PLEURAL Performed at [facility], [address], [place], [place] [address]    Special Requests   Final    NONE Performed at [facility], [address] [place], [place] [address]    Gram Stain   Final    FEW WBC PRESENT, PREDOMINANTLY MONONUCLEAR NO ORGANISMS SEEN Performed at [facility], [place] [address], [place], [place] [address]    Culture PENDING  Incomplete   Report Status PENDING  Incomplete  Resp Panel by RT-PCR (Flu A&B, Covid) Nasopharyngeal Swab     Status: None   Collection Time: 03/02/21 11:25 AM   Specimen: Nasopharyngeal Swab; Nasopharyngeal(NP) swabs in vial transport medium  Result  Value Ref Range Status   SARS Coronavirus 2 by RT PCR NEGATIVE NEGATIVE Final    Comment: (NOTE) SARS-CoV-2 target nucleic acids are NOT DETECTED.  The SARS-CoV-2 RNA is generally detectable in upper respiratory specimens during the acute phase of infection. The lowest concentration of SARS-CoV-2 viral copies this assay can detect is 138 copies/mL. A negative result does not preclude SARS-Cov-2 infection and should not be used as the sole basis for treatment or other patient management decisions. A negative result may occur with  improper specimen collection/handling, submission of specimen other than nasopharyngeal swab, presence of viral mutation(s) within the areas targeted by this assay, and inadequate number of viral copies(<138 copies/mL). A negative result must be combined with clinical observations, patient history, and epidemiological information. The expected result is Negative.  Fact Sheet for Patients:  EntrepreneurPulse.com.au  Fact Sheet for Healthcare Providers:  IncredibleEmployment.be  This test is no t yet approved or cleared by the [place] FDA and  has been authorized for detection  and/or diagnosis of SARS-CoV-2 by FDA under an Emergency Use Authorization (EUA). This EUA will remain  in effect (meaning this test can be used) for the duration of the COVID-19 declaration under Section 564(b)(1) of the Act, 21 U.S.C.section 360bbb-3(b)(1), unless the authorization is terminated  or revoked sooner.       Influenza A by PCR NEGATIVE NEGATIVE Final   Influenza B by PCR NEGATIVE NEGATIVE Final    Comment: (NOTE) The Xpert Xpress SARS-CoV-2/FLU/RSV plus assay is intended as an aid in the diagnosis of influenza from Nasopharyngeal swab specimens and should not be used as a sole basis for treatment. Nasal washings and aspirates are unacceptable for Xpert Xpress SARS-CoV-2/FLU/RSV testing.  Fact Sheet for Patients: EntrepreneurPulse.com.au  Fact Sheet for Healthcare Providers: IncredibleEmployment.be  This test is not yet approved or cleared by the [place] FDA and has been authorized for detection and/or diagnosis of SARS-CoV-2 by FDA under an Emergency Use Authorization (EUA). This EUA will remain in effect (meaning this test can be used) for the duration of the COVID-19 declaration under Section 564(b)(1) of the Act, 21 U.S.C. section 360bbb-3(b)(1), unless the authorization is terminated or revoked.  Performed at [facility], [address] [place], [place] [address]     Coagulation Studies: No results for input(s): LABPROT, INR in the last 72 hours.  Urinalysis: No results for input(s): COLORURINE, LABSPEC, PHURINE, GLUCOSEU, HGBUR, BILIRUBINUR, KETONESUR, PROTEINUR, UROBILINOGEN, NITRITE, LEUKOCYTESUR in the last 72 hours.  Invalid input(s): APPERANCEUR    Imaging: DG Chest Port 1 View  Result Date: 03/01/2021 CLINICAL DATA:  Status post thoracentesis EXAM: PORTABLE CHEST 1 VIEW COMPARISON:  02/02/2021 FINDINGS: Bilateral diffuse interstitial thickening. Small left pleural effusion. No pneumothorax.  Stable cardiomegaly. Prior CABG. No acute osseous abnormality. IMPRESSION: 1. Small left pleural effusion. No pneumothorax. 2. Mild pulmonary edema. Electronically Signed   By: Kathreen Devoid   On: 03/01/2021 16:15   DG Chest Port 1 View  Result Date: 03/01/2021 CLINICAL DATA:  Weakness. EXAM: PORTABLE CHEST 1 VIEW COMPARISON:  February 26, 2021. FINDINGS: Stable cardiomegaly. Status post coronary bypass graft. Central pulmonary vascular congestion is noted with bibasilar opacities concerning for edema and associated pleural effusions. No pneumothorax is noted. Bony thorax is unremarkable. IMPRESSION: Stable cardiomegaly with central pulmonary vascular congestion and probable bibasilar pulmonary edema and pleural effusions. Aortic Atherosclerosis (ICD10-I70.0). Electronically Signed   By: Marijo Conception M.D.   On: 03/01/2021 11:33   US THORACENTESIS ASP PLEURAL SPACE  W/IMG GUIDE  Result Date: 03/01/2021 INDICATION: Patient history atrial fibrillation, CHF, COPD, and chronic kidney disease. Presents the ED at for generalized weakness. Found to have a right-sided pleural effusion. Request is for therapeutic and diagnostic thoracentesis EXAM: ULTRASOUND GUIDED THERAPEUTIC AND DIAGNOSTIC RIGHT SIDED THORACENTESIS MEDICATIONS: Lidocaine 1% 10 mL COMPLICATIONS: None immediate. PROCEDURE: An ultrasound guided thoracentesis was thoroughly discussed with the patient and questions answered. The benefits, risks, alternatives and complications were also discussed. The patient understands and wishes to proceed with the procedure. Written consent was obtained. Ultrasound was performed to localize and mark an adequate pocket of fluid in the right chest. The area was then prepped and draped in the normal sterile fashion. 1% Lidocaine was used for local anesthesia. Under ultrasound guidance a 6 Fr Safe-T-Centesis catheter was introduced. Thoracentesis was performed. The catheter was removed and a dressing applied. FINDINGS: A  total of approximately 800 mL of straw-colored fluid was removed. Samples were sent to the laboratory as requested by the clinical team. IMPRESSION: Successful ultrasound guided diagnostic and therapeutic right-sided thoracentesis yielding 800 mL of pleural fluid. Read by: Rushie Nyhan, NP Electronically Signed   By: Miachel Roux M.D.   On: 03/01/2021 16:05     Medications:   . sodium chloride 250 mL (02/24/21 1235)   . sodium chloride   Intravenous Once  . acidophilus  1 capsule Oral Daily  . cholecalciferol  1,000 Units Oral Daily  . hydrocortisone  25 mg Rectal BID  . insulin aspart  0-20 Units Subcutaneous TID WC  . insulin glargine  15 Units Subcutaneous QHS  . magnesium oxide  400 mg Oral Daily  . metolazone  2.5 mg Oral Daily  . multivitamin-lutein  1 capsule Oral Daily  . nicotine  21 mg Transdermal Daily  . pantoprazole (PROTONIX) IV  40 mg Intravenous Q12H  . polyethylene glycol  17 g Oral Daily  . Ensure Max Protein  11 oz Oral BID BM  . simvastatin  20 mg Oral q AM  . tiotropium  18 mcg Inhalation Daily  . torsemide  40 mg Oral Daily  . traZODone  50 mg Oral QHS   sodium chloride, acetaminophen, albuterol, ipratropium-albuterol, melatonin, ondansetron **OR** ondansetron (ZOFRAN) IV      Assessment/ Plan:  Mr. Yahia Bottger is a 75 y.o.  male with past medical history of chronic Atrial Fibrillation, CHF, diabetes, CKD stage 3, GERD and hypertension. He presents to the ED with generalized weakness for 3 days.   1) AKI secondary to ATN  2) Volume Overload CKD risk factors include diabetes, hypertension, age, atherosclerosis.  Increased echogenicity noted in both kidneys. Lab Results  Component Value Date   CREATININE 2.31 (H) 03/02/2021   CREATININE 2.34 (H) 03/01/2021   CREATININE 2.17 (H) 02/28/2021   Renal function slightly better today Furosemide infusion discontinued yesterday, on Torsemide now  3)Anemia of chronic disease and iron deficiency  anemia  CBC Latest Ref Rng & Units 03/02/2021 03/01/2021 02/28/2021  WBC 4.0 - 10.5 K/uL 6.7 6.7 6.7  Hemoglobin 13.0 - 17.0 g/dL 8.2(L) 8.1(L) 9.0(L)  Hematocrit 39.0 - 52.0 % 27.2(L) 27.6(L) 29.4(L)  Platelets 150 - 400 K/uL 247 247 252    Received PRBC transfusion during this admission Will continue monitoring CBCs       LOS: [place] 4/30/20221:08 PM [facility]

## 2021-03-02 NOTE — Progress Notes (Signed)
Patient discharged per orders. PIV/tele removed from patient. Transported to [facility] by medical transport.

## 2021-03-02 NOTE — TOC Transition Note (Signed)
Transition of Care [facility]) - CM/SW Discharge Note   Patient Details  Name: [name] MRN: [id number] Date of Birth: Mar 29, 1946  [facility] CM/SW Contact:  Harriet Masson, RN Phone Number: [phone] 03/02/2021, 3:19 PM   Clinical Narrative:    Pt ready for discharge to SNF today with HTA approvals. Facility: [facility] reference [id number] approved for 5 days starting 03/02/2021 Transportation : [facility] #[id number]/ [facility]#[id number] 2:20 pm-[facility] initially however unable to route pt today.  Spouse Bethena Roys): Updated at 9:98 pm [facility]) : Approved and confirmed d/c summary and Covid test was received. Pt going to E4 unit [phone]  Medical Necessity and face-sheet prepared for [facility] and bedside nurse aware of who to call report via [facility].  No other needs however TOC will continue to follow.    Final next level of care: Skilled Nursing Facility Barriers to Discharge: No Barriers Identified   Patient Goals and CMS Choice Patient states their goals for this hospitalization and ongoing recovery are:: To return home with wife.   Choice offered to / list presented to : NA  Discharge Placement                  Name of family member notified: Cahlil Sattar Patient and family notified of of transfer: 03/02/21  Discharge Plan and Services In-house Referral: Clinical Social Work              DME Arranged: N/A DME Agency: NA       HH Arranged: NA [place] Agency: NA        Social Determinants of Health (SDOH) Interventions     Readmission Risk Interventions Readmission Risk Prevention Plan 02/04/2021 10/28/2020 12/27/2019  Transportation Screening Complete Complete Complete  PCP or Specialist Appt within 3-5 Days - - Complete  HRI or [place] - - Complete  Medication Review (RN Care Manager) Complete Complete Complete  PCP or Specialist appointment within 3-5 days of discharge Complete Complete -   [place] or Home Care Consult Complete Complete -  SW Recovery Care/Counseling Consult Complete - -  Palliative Care Screening Not Applicable Not Applicable -  [place] Not Applicable Not Applicable -  Some recent data might be hidden

## 2021-03-03 DIAGNOSIS — I890 Lymphedema, not elsewhere classified: Secondary | ICD-10-CM | POA: Diagnosis not present

## 2021-03-03 DIAGNOSIS — R1311 Dysphagia, oral phase: Secondary | ICD-10-CM | POA: Diagnosis not present

## 2021-03-03 DIAGNOSIS — N183 Chronic kidney disease, stage 3 unspecified: Secondary | ICD-10-CM | POA: Diagnosis not present

## 2021-03-03 DIAGNOSIS — Z87891 Personal history of nicotine dependence: Secondary | ICD-10-CM | POA: Diagnosis not present

## 2021-03-03 DIAGNOSIS — I5031 Acute diastolic (congestive) heart failure: Secondary | ICD-10-CM | POA: Diagnosis not present

## 2021-03-03 DIAGNOSIS — G8929 Other chronic pain: Secondary | ICD-10-CM | POA: Diagnosis not present

## 2021-03-03 DIAGNOSIS — J9611 Chronic respiratory failure with hypoxia: Secondary | ICD-10-CM | POA: Diagnosis not present

## 2021-03-03 DIAGNOSIS — Z8679 Personal history of other diseases of the circulatory system: Secondary | ICD-10-CM | POA: Diagnosis not present

## 2021-03-03 DIAGNOSIS — E11649 Type 2 diabetes mellitus with hypoglycemia without coma: Secondary | ICD-10-CM | POA: Diagnosis not present

## 2021-03-03 DIAGNOSIS — R17 Unspecified jaundice: Secondary | ICD-10-CM | POA: Diagnosis not present

## 2021-03-03 DIAGNOSIS — E612 Magnesium deficiency: Secondary | ICD-10-CM | POA: Diagnosis not present

## 2021-03-03 DIAGNOSIS — I509 Heart failure, unspecified: Secondary | ICD-10-CM | POA: Diagnosis not present

## 2021-03-03 DIAGNOSIS — Z79899 Other long term (current) drug therapy: Secondary | ICD-10-CM | POA: Diagnosis not present

## 2021-03-03 DIAGNOSIS — I350 Nonrheumatic aortic (valve) stenosis: Secondary | ICD-10-CM | POA: Diagnosis not present

## 2021-03-03 DIAGNOSIS — D638 Anemia in other chronic diseases classified elsewhere: Secondary | ICD-10-CM | POA: Diagnosis not present

## 2021-03-03 DIAGNOSIS — E1151 Type 2 diabetes mellitus with diabetic peripheral angiopathy without gangrene: Secondary | ICD-10-CM | POA: Diagnosis not present

## 2021-03-03 DIAGNOSIS — Z794 Long term (current) use of insulin: Secondary | ICD-10-CM | POA: Diagnosis not present

## 2021-03-03 DIAGNOSIS — Z7901 Long term (current) use of anticoagulants: Secondary | ICD-10-CM | POA: Diagnosis not present

## 2021-03-03 DIAGNOSIS — M6281 Muscle weakness (generalized): Secondary | ICD-10-CM | POA: Diagnosis not present

## 2021-03-03 DIAGNOSIS — R262 Difficulty in walking, not elsewhere classified: Secondary | ICD-10-CM | POA: Diagnosis not present

## 2021-03-03 DIAGNOSIS — J984 Other disorders of lung: Secondary | ICD-10-CM | POA: Diagnosis not present

## 2021-03-03 DIAGNOSIS — H539 Unspecified visual disturbance: Secondary | ICD-10-CM | POA: Diagnosis not present

## 2021-03-03 DIAGNOSIS — R5381 Other malaise: Secondary | ICD-10-CM | POA: Diagnosis not present

## 2021-03-03 DIAGNOSIS — R279 Unspecified lack of coordination: Secondary | ICD-10-CM | POA: Diagnosis not present

## 2021-03-03 DIAGNOSIS — E785 Hyperlipidemia, unspecified: Secondary | ICD-10-CM | POA: Diagnosis not present

## 2021-03-03 DIAGNOSIS — N179 Acute kidney failure, unspecified: Secondary | ICD-10-CM | POA: Diagnosis not present

## 2021-03-03 DIAGNOSIS — R278 Other lack of coordination: Secondary | ICD-10-CM | POA: Diagnosis not present

## 2021-03-03 DIAGNOSIS — Z8673 Personal history of transient ischemic attack (TIA), and cerebral infarction without residual deficits: Secondary | ICD-10-CM | POA: Diagnosis not present

## 2021-03-03 DIAGNOSIS — I6389 Other cerebral infarction: Secondary | ICD-10-CM | POA: Diagnosis not present

## 2021-03-03 DIAGNOSIS — R531 Weakness: Secondary | ICD-10-CM | POA: Diagnosis not present

## 2021-03-03 DIAGNOSIS — I5033 Acute on chronic diastolic (congestive) heart failure: Secondary | ICD-10-CM | POA: Diagnosis not present

## 2021-03-03 DIAGNOSIS — J9600 Acute respiratory failure, unspecified whether with hypoxia or hypercapnia: Secondary | ICD-10-CM | POA: Diagnosis not present

## 2021-03-03 DIAGNOSIS — E568 Deficiency of other vitamins: Secondary | ICD-10-CM | POA: Diagnosis not present

## 2021-03-03 DIAGNOSIS — N170 Acute kidney failure with tubular necrosis: Secondary | ICD-10-CM | POA: Diagnosis not present

## 2021-03-03 DIAGNOSIS — K648 Other hemorrhoids: Secondary | ICD-10-CM | POA: Diagnosis not present

## 2021-03-03 DIAGNOSIS — I639 Cerebral infarction, unspecified: Secondary | ICD-10-CM | POA: Diagnosis not present

## 2021-03-03 DIAGNOSIS — I480 Paroxysmal atrial fibrillation: Secondary | ICD-10-CM | POA: Diagnosis not present

## 2021-03-03 DIAGNOSIS — B351 Tinea unguium: Secondary | ICD-10-CM | POA: Diagnosis not present

## 2021-03-03 DIAGNOSIS — E119 Type 2 diabetes mellitus without complications: Secondary | ICD-10-CM | POA: Diagnosis not present

## 2021-03-03 DIAGNOSIS — R296 Repeated falls: Secondary | ICD-10-CM | POA: Diagnosis not present

## 2021-03-03 DIAGNOSIS — E559 Vitamin D deficiency, unspecified: Secondary | ICD-10-CM | POA: Diagnosis not present

## 2021-03-03 DIAGNOSIS — J449 Chronic obstructive pulmonary disease, unspecified: Secondary | ICD-10-CM | POA: Diagnosis not present

## 2021-03-03 DIAGNOSIS — E440 Moderate protein-calorie malnutrition: Secondary | ICD-10-CM | POA: Diagnosis not present

## 2021-03-03 DIAGNOSIS — R2689 Other abnormalities of gait and mobility: Secondary | ICD-10-CM | POA: Diagnosis not present

## 2021-03-03 DIAGNOSIS — I4891 Unspecified atrial fibrillation: Secondary | ICD-10-CM | POA: Diagnosis not present

## 2021-03-03 DIAGNOSIS — F17200 Nicotine dependence, unspecified, uncomplicated: Secondary | ICD-10-CM | POA: Diagnosis not present

## 2021-03-03 DIAGNOSIS — I10 Essential (primary) hypertension: Secondary | ICD-10-CM | POA: Diagnosis not present

## 2021-03-03 DIAGNOSIS — I5022 Chronic systolic (congestive) heart failure: Secondary | ICD-10-CM | POA: Diagnosis not present

## 2021-03-03 DIAGNOSIS — I2510 Atherosclerotic heart disease of native coronary artery without angina pectoris: Secondary | ICD-10-CM | POA: Diagnosis not present

## 2021-03-03 DIAGNOSIS — R41841 Cognitive communication deficit: Secondary | ICD-10-CM | POA: Diagnosis not present

## 2021-03-03 DIAGNOSIS — I5030 Unspecified diastolic (congestive) heart failure: Secondary | ICD-10-CM | POA: Diagnosis not present

## 2021-03-03 DIAGNOSIS — I110 Hypertensive heart disease with heart failure: Secondary | ICD-10-CM | POA: Diagnosis present

## 2021-03-03 DIAGNOSIS — E1140 Type 2 diabetes mellitus with diabetic neuropathy, unspecified: Secondary | ICD-10-CM | POA: Diagnosis not present

## 2021-03-03 DIAGNOSIS — Z951 Presence of aortocoronary bypass graft: Secondary | ICD-10-CM | POA: Diagnosis not present

## 2021-03-03 DIAGNOSIS — J440 Chronic obstructive pulmonary disease with acute lower respiratory infection: Secondary | ICD-10-CM | POA: Diagnosis not present

## 2021-03-05 ENCOUNTER — Inpatient Hospital Stay: Payer: PPO

## 2021-03-05 DIAGNOSIS — N170 Acute kidney failure with tubular necrosis: Secondary | ICD-10-CM | POA: Diagnosis not present

## 2021-03-05 DIAGNOSIS — N179 Acute kidney failure, unspecified: Secondary | ICD-10-CM | POA: Diagnosis not present

## 2021-03-05 DIAGNOSIS — R5381 Other malaise: Secondary | ICD-10-CM | POA: Diagnosis not present

## 2021-03-05 DIAGNOSIS — I5033 Acute on chronic diastolic (congestive) heart failure: Secondary | ICD-10-CM | POA: Diagnosis not present

## 2021-03-05 LAB — BODY FLUID CULTURE W GRAM STAIN: Culture: NO GROWTH

## 2021-03-05 LAB — CYTOLOGY - NON PAP

## 2021-03-06 ENCOUNTER — Encounter: Payer: Self-pay | Admitting: *Deleted

## 2021-03-06 DIAGNOSIS — I5022 Chronic systolic (congestive) heart failure: Secondary | ICD-10-CM

## 2021-03-06 NOTE — Progress Notes (Signed)
Pulmonary Individual Treatment Plan  Patient Details  Name: [name] MRN: [id number] Date of Birth: 20-Apr-1946 Referring Provider:   Flowsheet Row Pulmonary Rehab from 01/17/2021 in [facility] Cardiac and Pulmonary Rehab  Referring Provider Bartholome Bill MD      Initial Encounter Date:  Flowsheet Row Pulmonary Rehab from 01/17/2021 in [facility] Cardiac and Pulmonary Rehab  Date 01/17/21      Visit Diagnosis: Heart failure, chronic systolic ([place])  Patient's Home Medications on Admission:  Current Outpatient Medications:  .  acetaminophen (TYLENOL) 325 MG tablet, Take 2 tablets (650 mg total) by mouth every 6 (six) hours as needed for mild pain (or Fever >/= 101)., Disp: , Rfl:  .  albuterol (PROVENTIL HFA;VENTOLIN HFA) 108 (90 Base) MCG/ACT inhaler, Inhale 2 puffs into the lungs every 6 (six) hours as needed for wheezing or shortness of breath., Disp: , Rfl:  .  apixaban (ELIQUIS) 2.5 MG TABS tablet, Take 1 tablet (2.5 mg total) by mouth 2 (two) times daily., Disp: 60 tablet, Rfl:  .  bifidobacterium infantis (ALIGN) capsule, Take 1 capsule by mouth every morning., Disp: , Rfl:  .  cholecalciferol (VITAMIN D) 1000 units tablet, Take 1,000 Units by mouth daily., Disp: , Rfl:  .  Cinnamon 500 MG capsule, Take 1,000 mg by mouth 2 (two) times daily., Disp: , Rfl:  .  COMBIVENT RESPIMAT 20-100 MCG/ACT AERS respimat, Inhale 2 puffs into the lungs 4 (four) times daily as needed., Disp: , Rfl:  .  CRANBERRY EXTRACT PO, Take 500 mg by mouth every morning. , Disp: , Rfl:  .  Ensure Max Protein (ENSURE MAX PROTEIN) LIQD, Take 330 mLs (11 oz total) by mouth 2 (two) times daily between meals., Disp:  , Rfl:  .  gabapentin (NEURONTIN) 100 MG capsule, Take 100 mg by mouth 2 (two) times daily., Disp: , Rfl:  .  glucose 4 GM chewable tablet, Chew 1 tablet by mouth as needed for low blood sugar., Disp: , Rfl:  .  insulin aspart (NOVOLOG) 100 UNIT/ML injection, 0-9 Units, Subcutaneous, 3 times daily with meals CBG  < 70: Implement Hypoglycemia protocol/measures CBG 70 - 120: 0 units CBG 121 - 150: 1 unit CBG 151 - 200: 2 units CBG 201 - 250: 3 units CBG 251 - 300: 5 units CBG 301 - 350: 7 units CBG 351 - 400: 9 units CBG > 400: call MD (Patient taking differently: Inject into the skin 3 (three) times daily with meals. 0-9 Units, Subcutaneous, 3 times daily with meals CBG < 70: Implement Hypoglycemia protocol/measures CBG 70 - 120: 0 units CBG 121 - 150: 1 unit CBG 151 - 200: 2 units CBG 201 - 250: 3 units CBG 251 - 300: 5 units CBG 301 - 350: 7 units CBG 351 - 400: 9 units CBG > 400: call MD), Disp: 10 mL, Rfl: 11 .  iron polysaccharides (NIFEREX) 150 MG capsule, Take 1 capsule (150 mg total) by mouth daily. Can take any over-the-counter iron supplement., Disp: , Rfl:  .  lactase (LACTAID) 3000 units tablet, Take 3,000 Units by mouth 3 (three) times daily between meals as needed., Disp: , Rfl:  .  LANTUS SOLOSTAR 100 UNIT/ML Solostar Pen, Inject 15 Units into the skin daily at 10 pm., Disp: 15 mL, Rfl: 11 .  magnesium oxide (MAG-OX) 400 MG tablet, Take 400 mg by mouth daily., Disp: , Rfl:  .  metolazone (ZAROXOLYN) 2.5 MG tablet, Take 1 tablet (2.5 mg total) by mouth daily., Disp: ,  Rfl:  .  Multiple Vitamin (MULTIVITAMIN WITH MINERALS) TABS tablet, Take 1 tablet by mouth daily., Disp: , Rfl:  .  multivitamin-lutein (OCUVITE-LUTEIN) CAPS capsule, Take 1 capsule by mouth daily., Disp: , Rfl:  .  nicotine (NICODERM CQ - DOSED IN MG/24 HOURS) 21 mg/24hr patch, Place 1 patch (21 mg total) onto the skin daily for 28 days., Disp: 28 patch, Rfl: 0 .  omeprazole (PRILOSEC) 20 MG capsule, Take 20 mg by mouth daily., Disp: , Rfl:  .  simvastatin (ZOCOR) 20 MG tablet, Take 20 mg by mouth in the morning., Disp: , Rfl:  .  SUPER B COMPLEX/C PO, Take 1 tablet by mouth every morning., Disp: , Rfl:  .  tiotropium (SPIRIVA HANDIHALER) 18 MCG inhalation capsule, Place 1 capsule (18 mcg total) into inhaler and inhale daily., Disp: 30  capsule, Rfl: 2 .  torsemide (DEMADEX) 20 MG tablet, Take 40 mg by mouth daily., Disp: , Rfl:  .  traZODone (DESYREL) 50 MG tablet, Take 50 mg by mouth at bedtime., Disp: , Rfl:  .  vitamin C (ASCORBIC ACID) 500 MG tablet, Take 500 mg by mouth 2 (two) times daily. , Disp: , Rfl:   Past Medical History: Past Medical History:  Diagnosis Date  . Arrhythmia    atrial fibrillation  . Atrial fibrillation ([place])   . Basal cell carcinoma 05/2019   right nasal ala, Tx: EDC  . Basal cell carcinoma 04/12/2019   Left posterior ear. Nodular pattern, excoriated.   . CHF (congestive heart failure) ([place])   . COPD (chronic obstructive pulmonary disease) ([place])   . Coronary artery disease   . Diabetes mellitus without complication ([place])   . GERD (gastroesophageal reflux disease)   . HLD (hyperlipidemia)   . Hypertension   . Peripheral vascular disease ([place])   . Pneumonia   . Prostate cancer ([place])   . Squamous cell carcinoma of skin 08/10/2018   Left upper arm above elbow. WD SCC.  Marland Kitchen Squamous cell carcinoma of skin 09/07/2018   Right forearm, below elbow. WD SCC. [facility] 01/10/2019.  Marland Kitchen Stroke [facility])     Tobacco Use: Social History   Tobacco Use  Smoking Status Current Every Day Smoker  . Packs/day: 1.00  . Years: 54.00  . Pack years: 54.00  . Types: Cigarettes  Smokeless Tobacco Never Used    Labs: Recent Review Flowsheet Data    Labs for ITP Cardiac and Pulmonary Rehab Latest Ref Rng & Units 10/23/2020 10/24/2020 10/24/2020 01/28/2021 02/20/2021   Hemoglobin A1c 4.8 - 5.6 % - 7.3(H) 7.6(H) 6.8(H) -   PHART 7.350 - 7.450 - - - - -   PCO2ART 32.0 - 48.0 mmHg - - - - -   HCO3 20.0 - 28.0 mmol/L 34.4(H) - - 29.2(H) 22.5   TCO2 0 - 100 mmol/L - - - - -   ACIDBASEDEF 0.0 - 2.0 mmol/L - - - - 2.6(H)   O2SAT % 58.9 - - 45.7 80.2       Pulmonary Assessment Scores:  Pulmonary Assessment Scores    Row Name 01/17/21 1044         ADL UCSD   ADL Phase Entry     SOB Score total 85     Rest 2      Walk 3     Stairs 5     Bath 3     Dress 3     Shop 2  CAT Score   CAT Score 27           mMRC Score   mMRC Score 3            UCSD: Self-administered rating of dyspnea associated with activities of daily living (ADLs) 6-point scale (0 = "not at all" to 5 = "maximal or unable to do because of breathlessness")  Scoring Scores range from 0 to 120.  Minimally important difference is 5 units  CAT: CAT can identify the health impairment of COPD patients and is better correlated with disease progression.  CAT has a scoring range of zero to 40. The CAT score is classified into four groups of low (less than 10), medium (10 - 20), high (21-30) and very high (31-40) based on the impact level of disease on health status. A CAT score over 10 suggests significant symptoms.  A worsening CAT score could be explained by an exacerbation, poor medication adherence, poor inhaler technique, or progression of COPD or comorbid conditions.  CAT MCID is 2 points  mMRC: mMRC (Modified Medical Research Council) Dyspnea Scale is used to assess the degree of baseline functional disability in patients of respiratory disease due to dyspnea. No minimal important difference is established. A decrease in score of 1 point or greater is considered a positive change.   Pulmonary Function Assessment:   Exercise Target Goals: Exercise Program Goal: Individual exercise prescription set using results from initial 6 min walk test and THRR while considering  patient's activity barriers and safety.   Exercise Prescription Goal: Initial exercise prescription builds to 30-45 minutes a day of aerobic activity, 2-3 days per week.  Home exercise guidelines will be given to patient during program as part of exercise prescription that the participant will acknowledge.  Education: Aerobic Exercise: - Group verbal and visual presentation on the components of exercise prescription. Introduces F.I.T.T principle from  ACSM for exercise prescriptions.  Reviews F.I.T.T. principles of aerobic exercise including progression. Written material given at graduation. Flowsheet Row Pulmonary Rehab from 01/24/2021 in [facility] Cardiac and Pulmonary Rehab  Education need identified 01/17/21      Education: Resistance Exercise: - Group verbal and visual presentation on the components of exercise prescription. Introduces F.I.T.T principle from ACSM for exercise prescriptions  Reviews F.I.T.T. principles of resistance exercise including progression. Written material given at graduation.    Education: Exercise & Equipment Safety: - Individual verbal instruction and demonstration of equipment use and safety with use of the equipment. Flowsheet Row Pulmonary Rehab from 01/24/2021 in [facility] Cardiac and Pulmonary Rehab  Date 01/17/21  Educator [facility]  Instruction Review Code 1- Verbalizes Understanding      Education: Exercise Physiology & General Exercise Guidelines: - Group verbal and written instruction with models to review the exercise physiology of the cardiovascular system and associated critical values. Provides general exercise guidelines with specific guidelines to those with heart or lung disease.    Education: Flexibility, Balance, Mind/Body Relaxation: - Group verbal and visual presentation with interactive activity on the components of exercise prescription. Introduces F.I.T.T principle from ACSM for exercise prescriptions. Reviews F.I.T.T. principles of flexibility and balance exercise training including progression. Also discusses the mind body connection.  Reviews various relaxation techniques to help reduce and manage stress (i.e. Deep breathing, progressive muscle relaxation, and visualization). Balance handout provided to take home. Written material given at graduation.   Activity Barriers & Risk Stratification:  Activity Barriers & Cardiac Risk Stratification - 01/17/21 1036      Activity Barriers &  Cardiac Risk  Stratification   Activity Barriers Assistive Device;Balance Concerns;Shortness of Breath;Deconditioning;Muscular Weakness;History of Falls;Other (comment)    Comments residuals from stroke, double vision    Cardiac Risk Stratification Moderate           6 Minute Walk:  6 Minute Walk    Row Name 01/17/21 1034         6 Minute Walk   Phase --  NuStep Test     Distance 253 feet  steps     Walk Time 6 minutes     # of Rest Breaks 0     METS 1.1     RPE 13     Perceived Dyspnea  2     Symptoms Yes (comment)     Comments SOB, fatigue     Resting HR 65 bpm     Resting BP 154/64     Resting Oxygen Saturation  96 %     Exercise Oxygen Saturation  during 6 min walk 92 %     Max Ex. HR 72 bpm     Max Ex. BP 168/84     2 Minute Post BP 144/64           Interval HR   1 Minute HR 70     2 Minute HR 72     3 Minute HR 72     4 Minute HR 64     5 Minute HR 67     6 Minute HR 68     2 Minute Post HR 56     Interval Heart Rate? Yes           Interval Oxygen   Interval Oxygen? Yes     Baseline Oxygen Saturation % 96 %     1 Minute Oxygen Saturation % 93 %     1 Minute Liters of Oxygen 0 L  Room Air     2 Minute Oxygen Saturation % 92 %     2 Minute Liters of Oxygen 0 L     3 Minute Oxygen Saturation % 93 %     3 Minute Liters of Oxygen 0 L     4 Minute Oxygen Saturation % 98 %     4 Minute Liters of Oxygen 0 L     5 Minute Oxygen Saturation % 97 %     5 Minute Liters of Oxygen 0 L     6 Minute Oxygen Saturation % 97 %     6 Minute Liters of Oxygen 0 L     2 Minute Post Oxygen Saturation % 97 %     2 Minute Post Liters of Oxygen 0 L           Oxygen Initial Assessment:  Oxygen Initial Assessment - 01/07/21 1537      Home Oxygen   Home Oxygen Device None    Sleep Oxygen Prescription None    Home Exercise Oxygen Prescription None    Home Resting Oxygen Prescription None    Compliance with Home Oxygen Use Yes      Intervention   Short Term Goals To learn and  demonstrate proper use of respiratory medications;To learn and demonstrate proper pursed lip breathing techniques or other breathing techniques.    Long  Term Goals Exhibits proper breathing techniques, such as pursed lip breathing or other method taught during program session;Compliance with respiratory medication           Oxygen Re-Evaluation:  Oxygen Re-Evaluation  [place] Name 01/22/21 1115             Home Oxygen   Home Oxygen Device None       Sleep Oxygen Prescription None       Home Exercise Oxygen Prescription None       Home Resting Oxygen Prescription None               Goals/Expected Outcomes   Short Term Goals To learn and demonstrate proper pursed lip breathing techniques or other breathing techniques.;To learn and understand importance of monitoring SPO2 with pulse oximeter and demonstrate accurate use of the pulse oximeter.;To learn and understand importance of maintaining oxygen saturations>88%       Long  Term Goals Exhibits proper breathing techniques, such as pursed lip breathing or other method taught during program session;Verbalizes importance of monitoring SPO2 with pulse oximeter and return demonstration;Maintenance of O2 saturations>88%       Comments Reviewed PLB technique with pt.  Talked about how it works and it's importance in maintaining their exercise saturations.       Goals/Expected Outcomes Short: Become more profiecient at using PLB.   Long: Become independent at using PLB.              Oxygen Discharge (Final Oxygen Re-Evaluation):  Oxygen Re-Evaluation - 01/22/21 1115      Home Oxygen   Home Oxygen Device None    Sleep Oxygen Prescription None    Home Exercise Oxygen Prescription None    Home Resting Oxygen Prescription None      Goals/Expected Outcomes   Short Term Goals To learn and demonstrate proper pursed lip breathing techniques or other breathing techniques.;To learn and understand importance of monitoring SPO2 with pulse  oximeter and demonstrate accurate use of the pulse oximeter.;To learn and understand importance of maintaining oxygen saturations>88%    Long  Term Goals Exhibits proper breathing techniques, such as pursed lip breathing or other method taught during program session;Verbalizes importance of monitoring SPO2 with pulse oximeter and return demonstration;Maintenance of O2 saturations>88%    Comments Reviewed PLB technique with pt.  Talked about how it works and it's importance in maintaining their exercise saturations.    Goals/Expected Outcomes Short: Become more profiecient at using PLB.   Long: Become independent at using PLB.           Initial Exercise Prescription:  Initial Exercise Prescription - 01/17/21 1000      Date of Initial Exercise RX and Referring Provider   Date 01/17/21    Referring Provider Bartholome Bill MD      Recumbant Bike   Level 1    RPM 60    Minutes 15    METs 1.2      NuStep   Level 1    SPM 60    Minutes 15    METs 1.2      Arm Ergometer   Level 1    RPM 25    Minutes 15    METs 1      Prescription Details   Frequency (times per week) 2    Duration Progress to 30 minutes of continuous aerobic without signs/symptoms of physical distress      Intensity   THRR 40-80% of Max Heartrate 97-129    Ratings of Perceived Exertion 11-13    Perceived Dyspnea 0-4      Progression   Progression Continue to progress workloads to maintain intensity without signs/symptoms of physical distress.  Resistance Training   Training Prescription Yes    Weight 3 lb    Reps 10-15           Perform Capillary Blood Glucose checks as needed.  Exercise Prescription Changes:  Exercise Prescription Changes    Row Name 01/17/21 1000 01/22/21 1300 02/05/21 1600 02/20/21 1100       Response to Exercise   Blood Pressure (Admit) 154/64 116/46 80/46 118/66    Blood Pressure (Exercise) 168/84 100/60 88/52 98/52     Blood Pressure (Exit) 124/64 110/54 84/60 114/60     Heart Rate (Admit) 65 bpm 70 bpm 86 bpm 57 bpm    Heart Rate (Exercise) 72 bpm 76 bpm 60 bpm 62 bpm    Heart Rate (Exit) 65 bpm 71 bpm 56 bpm 55 bpm    Oxygen Saturation (Admit) 96 % 94 % 95 % 99 %    Oxygen Saturation (Exercise) 92 % 92 % 92 % 98 %    Oxygen Saturation (Exit) 95 % -- 91 % 92 %    Rating of Perceived Exertion (Exercise) 13 11 13  --    Perceived Dyspnea (Exercise) 2 1 2  --    Symptoms SOB, fatigue -- -- --    Comments NuStep test results first day second full day first day back    Duration -- Progress to 30 minutes of  aerobic without signs/symptoms of physical distress Continue with 30 min of aerobic exercise without signs/symptoms of physical distress. --    Intensity -- THRR unchanged THRR unchanged --         Progression   Progression -- Continue to progress workloads to maintain intensity without signs/symptoms of physical distress. Continue to progress workloads to maintain intensity without signs/symptoms of physical distress. --    Average METs -- 1.2 2 --         Resistance Training   Training Prescription -- Yes Yes Yes    Weight -- 3 lb 3 lb 3 lb    Reps -- 10-15 10-15 10-15         Interval Training   Interval Training -- -- No --         Recumbant Bike   Level -- -- 1 --    Minutes -- -- 15 --    METs -- -- 2 --         NuStep   Level -- 1 -- --    Minutes -- 15 -- --    METs -- 1.2 -- --           Exercise Comments:  Exercise Comments    Row Name 01/22/21 1114           Exercise Comments First full day of exercise!  Patient was oriented to gym and equipment including functions, settings, policies, and procedures.  Patient's individual exercise prescription and treatment plan were reviewed.  All starting workloads were established based on the results of the 6 minute walk test done at initial orientation visit.  The plan for exercise progression was also introduced and progression will be customized based on patient's performance and  goals.              Exercise Goals and Review:  Exercise Goals    Row Name 01/17/21 1040             Exercise Goals   Increase Physical Activity Yes       Intervention Provide advice, education, support and counseling about physical activity/exercise needs.;Develop  an individualized exercise prescription for aerobic and resistive training based on initial evaluation findings, risk stratification, comorbidities and participant's personal goals.       Expected Outcomes Short Term: Attend rehab on a regular basis to increase amount of physical activity.;Long Term: Add in home exercise to make exercise part of routine and to increase amount of physical activity.;Long Term: Exercising regularly at least 3-5 days a week.       Increase Strength and Stamina Yes       Intervention Provide advice, education, support and counseling about physical activity/exercise needs.;Develop an individualized exercise prescription for aerobic and resistive training based on initial evaluation findings, risk stratification, comorbidities and participant's personal goals.       Expected Outcomes Short Term: Perform resistance training exercises routinely during rehab and add in resistance training at home;Long Term: Improve cardiorespiratory fitness, muscular endurance and strength as measured by increased METs and functional capacity (6MWT);Short Term: Increase workloads from initial exercise prescription for resistance, speed, and METs.       Able to understand and use rate of perceived exertion (RPE) scale Yes       Intervention Provide education and explanation on how to use RPE scale       Expected Outcomes Short Term: Able to use RPE daily in rehab to express subjective intensity level;Long Term:  Able to use RPE to guide intensity level when exercising independently       Able to understand and use Dyspnea scale Yes       Intervention Provide education and explanation on how to use Dyspnea scale        Expected Outcomes Short Term: Able to use Dyspnea scale daily in rehab to express subjective sense of shortness of breath during exertion;Long Term: Able to use Dyspnea scale to guide intensity level when exercising independently       Knowledge and understanding of Target Heart Rate Range (THRR) Yes       Intervention Provide education and explanation of THRR including how the numbers were predicted and where they are located for reference       Expected Outcomes Short Term: Able to state/look up THRR;Short Term: Able to use daily as guideline for intensity in rehab;Long Term: Able to use THRR to govern intensity when exercising independently       Able to check pulse independently Yes       Intervention Provide education and demonstration on how to check pulse in carotid and radial arteries.;Review the importance of being able to check your own pulse for safety during independent exercise       Expected Outcomes Short Term: Able to explain why pulse checking is important during independent exercise;Long Term: Able to check pulse independently and accurately       Understanding of Exercise Prescription Yes       Intervention Provide education, explanation, and written materials on patient's individual exercise prescription       Expected Outcomes Short Term: Able to explain program exercise prescription;Long Term: Able to explain home exercise prescription to exercise independently              Exercise Goals Re-Evaluation :  Exercise Goals Re-Evaluation    [place] Name 01/22/21 1114 02/05/21 1630 02/20/21 1138         Exercise Goal Re-Evaluation   Exercise Goals Review Increase Physical Activity;Able to understand and use rate of perceived exertion (RPE) scale;Knowledge and understanding of Target Heart Rate Range (THRR);Understanding of Exercise Prescription;Increase Strength and  Stamina;Able to understand and use Dyspnea scale;Able to check pulse independently Increase Physical  Activity;Increase Strength and Stamina;Understanding of Exercise Prescription --     Comments Reviewed RPE and dyspnea scales, THR and program prescription with pt today.  Pt voiced understanding and was given a copy of goals to take home. Celia has completed his first two full days of exercise.  He is currently admitted for respiratory failure.  Will need clearance to return to rehab. Joakim had trouble transferring to equipmetn for exercise.  Staff spoke with patient and wife and all feel that PT is indicated before returning to [facility].     Expected Outcomes Short: Use RPE daily to regulate intensity. Long: Follow program prescription in THR. Short: Clearance to return Long: Continue to follow program prescription. --            Discharge Exercise Prescription (Final Exercise Prescription Changes):  Exercise Prescription Changes - 02/20/21 1100      Response to Exercise   Blood Pressure (Admit) 118/66    Blood Pressure (Exercise) 98/52    Blood Pressure (Exit) 114/60    Heart Rate (Admit) 57 bpm    Heart Rate (Exercise) 62 bpm    Heart Rate (Exit) 55 bpm    Oxygen Saturation (Admit) 99 %    Oxygen Saturation (Exercise) 98 %    Oxygen Saturation (Exit) 92 %    Comments first day back      Resistance Training   Training Prescription Yes    Weight 3 lb    Reps 10-15           Nutrition:  Target Goals: Understanding of nutrition guidelines, daily intake of sodium 1500mg , cholesterol 200mg , calories 30% from fat and 7% or less from saturated fats, daily to have 5 or more servings of fruits and vegetables.  Education: All About Nutrition: -Group instruction provided by verbal, written material, interactive activities, discussions, models, and posters to present general guidelines for heart healthy nutrition including fat, fiber, MyPlate, the role of sodium in heart healthy nutrition, utilization of the nutrition label, and utilization of this knowledge for meal planning. Follow up email  sent as well. Written material given at graduation. Flowsheet Row Pulmonary Rehab from 01/24/2021 in [facility] Cardiac and Pulmonary Rehab  Education need identified 01/17/21      Biometrics:  Pre Biometrics - 01/17/21 1040      Pre Biometrics   Height 5\' 6"  (1.676 m)    Weight 160 lb 4.8 oz (72.7 kg)    BMI (Calculated) 25.89            Nutrition Therapy Plan and Nutrition Goals:   Nutrition Assessments:  MEDIFICTS Score Key:  ?70 Need to make dietary changes   40-70 Heart Healthy Diet  ? 40 Therapeutic Level Cholesterol Diet  Flowsheet Row Pulmonary Rehab from 01/17/2021 in [facility] Cardiac and Pulmonary Rehab  Picture Your Plate Total Score on Admission 70     Picture Your Plate Scores:  <80 Unhealthy dietary pattern with much room for improvement.  41-50 Dietary pattern unlikely to meet recommendations for good health and room for improvement.  51-60 More healthful dietary pattern, with some room for improvement.   >60 Healthy dietary pattern, although there may be some specific behaviors that could be improved.   Nutrition Goals Re-Evaluation:   Nutrition Goals Discharge (Final Nutrition Goals Re-Evaluation):   Psychosocial: Target Goals: Acknowledge presence or absence of significant depression and/or stress, maximize coping skills, provide positive support system. Participant is  able to verbalize types and ability to use techniques and skills needed for reducing stress and depression.   Education: Stress, Anxiety, and Depression - Group verbal and visual presentation to define topics covered.  Reviews how body is impacted by stress, anxiety, and depression.  Also discusses healthy ways to reduce stress and to treat/manage anxiety and depression.  Written material given at graduation.   Education: Sleep Hygiene -Provides group verbal and written instruction about how sleep can affect your health.  Define sleep hygiene, discuss sleep cycles and impact of sleep  habits. Review good sleep hygiene tips.    Initial Review & Psychosocial Screening:  Initial Psych Review & Screening - 01/07/21 1538      Initial Review   Current issues with None Identified      Family Dynamics   Good Support System? Yes   wife     Barriers   Psychosocial barriers to participate in program There are no identifiable barriers or psychosocial needs.      Screening Interventions   Interventions Encouraged to exercise;To provide support and resources with identified psychosocial needs;Provide feedback about the scores to participant    Expected Outcomes Short Term goal: Utilizing psychosocial counselor, staff and physician to assist with identification of specific Stressors or current issues interfering with healing process. Setting desired goal for each stressor or current issue identified.;Long Term Goal: Stressors or current issues are controlled or eliminated.;Short Term goal: Identification and review with participant of any Quality of Life or Depression concerns found by scoring the questionnaire.;Long Term goal: The participant improves quality of Life and PHQ9 Scores as seen by post scores and/or verbalization of changes           Quality of Life Scores:  Quality of Life - 01/17/21 1040      Quality of Life   Select Quality of Life      Quality of Life Scores   Health/Function Pre 15.23 %    Socioeconomic Pre 23.25 %    Psych/Spiritual Pre 22.36 %    Family Pre 24 %    GLOBAL Pre 19.39 %          Scores of 19 and below usually indicate a poorer quality of life in these areas.  A difference of  2-3 points is a clinically meaningful difference.  A difference of 2-3 points in the total score of the Quality of Life Index has been associated with significant improvement in overall quality of life, self-image, physical symptoms, and general health in studies assessing change in quality of life.  PHQ-9: Recent Review Flowsheet Data    Depression screen [facility]  2/9 01/17/2021 08/26/2017 11/24/2016 01/09/2016 12/12/2015   Decreased Interest 0 0 0 0 0   Down, Depressed, Hopeless 0 0 0 0 0   PHQ - 2 Score 0 0 0 0 0   Altered sleeping 3 - - - -   Tired, decreased energy 3 - - - -   Change in appetite 0 - - - -   Feeling bad or failure about yourself  1 - - - -   Trouble concentrating 1 - - - -   Moving slowly or fidgety/restless 0 - - - -   Suicidal thoughts 0 - - - -   PHQ-9 Score 8 - - - -   Difficult doing work/chores Very difficult - - - -     Interpretation of Total Score  Total Score Depression Severity:  1-4 = Minimal depression, 5-9 =  Mild depression, 10-14 = Moderate depression, 15-19 = Moderately severe depression, 20-27 = Severe depression   Psychosocial Evaluation and Intervention:  Psychosocial Evaluation - 01/07/21 1555      Psychosocial Evaluation & Interventions   Comments Yong has no barriers to attending the program. He is ready to attend and hopes to see improved shortness of breath from the program. He lives with his wife, his only support, and their 2 cats. 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 He stated no issues with depression, stress nor anxiety.  He does depend on his wife for transportation after several mini strokes caused imbalance and some eye concerns.  He is ready to get started and should do well.           Psychosocial Re-Evaluation:   Psychosocial Discharge (Final Psychosocial Re-Evaluation):   Education: Education Goals: Education classes will be provided on a weekly basis, covering required topics. Participant will state understanding/return demonstration of topics presented.  Learning Barriers/Preferences:   General Pulmonary Education Topics:  Infection Prevention: - Provides verbal and written material to individual with discussion of infection control including proper hand washing and proper equipment cleaning during exercise session. Flowsheet Row Pulmonary  Rehab from 01/24/2021 in [facility]  Date 01/17/21  Educator [facility]  Instruction Review Code 1- Verbalizes Understanding      Falls Prevention: - Provides verbal and written material to individual with discussion of falls prevention and safety. Flowsheet Row Pulmonary Rehab from 01/24/2021 in [facility] Re[facility]  Date 01/17/21  Educator [facility]  Instruction Review Code 1- Verbalizes Understanding      Chronic Lung Disease Review: - Group verbal instruction with posters, models, PowerPoint presentations and videos,  to review new updates, new respiratory medications, new advancements in procedures and treatments. Providing information on websites and "800" numbers for continued self-education. Includes information about supplement oxygen, available portable oxygen systems, continuous and intermittent flow rates, oxygen safety, concentrators, and Medicare reimbursement for oxygen. Explanation of Pulmonary Drugs, including class, frequency, complications, importance of spacers, rinsing mouth after steroid MDI's, and proper cleaning methods for nebulizers. Review of basic lung anatomy and physiology related to function, structure, and complications of lung disease. Review of risk factors. Discussion about methods for diagnosing sleep apnea and types of masks and machines for OSA. Includes a review of the use of types of environmental controls: home humidity, furnaces, filters, dust mite/pet prevention, HEPA vacuums. Discussion about weather changes, air quality and the benefits of nasal washing. Instruction on Warning signs, infection symptoms, calling MD promptly, preventive modes, and value of vaccinations. Review of effective airway clearance, coughing and/or vibration techniques. Emphasizing that all should Create an Action Plan. Written material given at graduation.   AED/CPR: - Group verbal and written instruction with the use of models to demonstrate the basic use of  the AED with the basic ABC's of resuscitation.    Anatomy and Cardiac Procedures: - Group verbal and visual presentation and models provide information about basic cardiac anatomy and function. Reviews the testing methods done to diagnose heart disease and the outcomes of the test results. Describes the treatment choices: Medical Management, Angioplasty, or Coronary Bypass Surgery for treating various heart conditions including Myocardial Infarction, Angina, Valve Disease, and Cardiac Arrhythmias.  Written material given at graduation. Flowsheet Row Pulmonary Rehab from 01/24/2021 in [facility] Card[facility] and Pulmonary Rehab  Education need identified 01/17/21      Medication Safety: - Group verbal and  visual instruction to review commonly prescribed medications for heart and lung disease. Reviews the medication, class of the drug, and side effects. Includes the steps to properly store meds and maintain the prescription regimen.  Written material given at graduation.   Other: -Provides group and verbal instruction on various topics (see comments)   Knowledge Questionnaire Score:  Knowledge Questionnaire Score - 01/17/21 1042      Knowledge Questionnaire Score   Pre Score 23/26 Education Focus: nurtrition, exercise, angina            Core Components/Risk Factors/Patient Goals at Admission:  Personal Goals and Risk Factors at Admission - 01/17/21 1042      Core Components/Risk Factors/Patient Goals on Admission    Weight Management Yes;Weight Loss    Intervention Weight Management: Develop a combined nutrition and exercise program designed to reach desired caloric intake, while maintaining appropriate intake of nutrient and fiber, sodium and fats, and appropriate energy expenditure required for the weight goal.;Weight Management: Provide education and appropriate resources to help participant work on and attain dietary goals.;Weight Management/Obesity: Establish reasonable short term and long  term weight goals.    Admit Weight 160 lb 4.8 oz (72.7 kg)    Goal Weight: Short Term 157 lb (71.2 kg)    Goal Weight: Long Term 155 lb (70.3 kg)    Expected Outcomes Short Term: Continue to assess and modify interventions until short term weight is achieved;Long Term: Adherence to nutrition and physical activity/exercise program aimed toward attainment of established weight goal;Weight Maintenance: Understanding of the daily nutrition guidelines, which includes 25-35% calories from fat, 7% or less cal from saturated fats, less than 200mg  cholesterol, less than 1.5gm of sodium, & 5 or more servings of fruits and vegetables daily    Tobacco Cessation Yes    Number of packs per day Keyion is a current tobacco user. Intervention for tobacco cessation was provided at the initial medical review. He was asked about readiness to quit and reported he is not ready to quit at this time . Patient was advised and educated about tobacco cessation using combination therapy, tobacco cessation classes, quit line, and quit smoking apps. Patient demonstrated understanding of this material. Staff will continue to provide encouragement and follow up with the patient throughout the program.    Intervention Assist the participant in steps to quit. Provide individualized education and counseling about committing to Tobacco Cessation, relapse prevention, and pharmacological support that can be provided by physician.;Advice worker, assist with locating and accessing local/national Quit Smoking programs, and support quit date choice.    Expected Outcomes Short Term: Will demonstrate readiness to quit, by selecting a quit date.;Short Term: Will quit all tobacco product use, adhering to prevention of relapse plan.;Long Term: Complete abstinence from all tobacco products for at least 12 months from quit date.    Diabetes Yes    Intervention Provide education about signs/symptoms and action to take for  hypo/hyperglycemia.;Provide education about proper nutrition, including hydration, and aerobic/resistive exercise prescription along with prescribed medications to achieve blood glucose in normal ranges: Fasting glucose 65-99 mg/dL    Expected Outcomes Short Term: Participant verbalizes understanding of the signs/symptoms and immediate care of hyper/hypoglycemia, proper foot care and importance of medication, aerobic/resistive exercise and nutrition plan for blood glucose control.;Long Term: Attainment of HbA1C < 7%.    Heart Failure Yes    Intervention Provide a combined exercise and nutrition program that is supplemented with education, support and counseling about heart failure. Directed toward relieving  symptoms such as shortness of breath, decreased exercise tolerance, and extremity edema.    Expected Outcomes Improve functional capacity of life;Short term: Attendance in program 2-3 days a week with increased exercise capacity. Reported lower sodium intake. Reported increased fruit and vegetable intake. Reports medication compliance.;Short term: Daily weights obtained and reported for increase. Utilizing diuretic protocols set by physician.;Long term: Adoption of self-care skills and reduction of barriers for early signs and symptoms recognition and intervention leading to self-care maintenance.    Hypertension Yes    Intervention Provide education on lifestyle modifcations including regular physical activity/exercise, weight management, moderate sodium restriction and increased consumption of fresh fruit, vegetables, and low fat dairy, alcohol moderation, and smoking cessation.;Monitor prescription use compliance.    Expected Outcomes Short Term: Continued assessment and intervention until BP is < 140/61mm HG in hypertensive participants. < 130/52mm HG in hypertensive participants with diabetes, heart failure or chronic kidney disease.;Long Term: Maintenance of blood pressure at goal levels.    Lipids  Yes    Intervention Provide education and support for participant on nutrition & aerobic/resistive exercise along with prescribed medications to achieve LDL 70mg , HDL >40mg .    Expected Outcomes Short Term: Participant states understanding of desired cholesterol values and is compliant with medications prescribed. Participant is following exercise prescription and nutrition guidelines.;Long Term: Cholesterol controlled with medications as prescribed, with individualized exercise RX and with personalized nutrition plan. Value goals: LDL < 70mg , HDL > 40 mg.           Education:Diabetes - Individual verbal and written instruction to review signs/symptoms of diabetes, desired ranges of glucose level fasting, after meals and with exercise. Acknowledge that pre and post exercise glucose checks will be done for 3 sessions at entry of program. Flowsheet Row Pulmonary Rehab from 01/24/2021 in [facility] Pulmonary Re[facility]  Date 01/17/21  Educator [facility]  Instruction Review Code 1- Verbalizes Understanding      Know Your Numbers and Heart Failure: - Group verbal and visual instruction to discuss disease risk factors for cardiac and pulmonary disease and treatment options.  Reviews associated critical values for Overweight/Obesity, Hypertension, Cholesterol, and Diabetes.  Discusses basics of heart failure: signs/symptoms and treatments.  Introduces Heart Failure Zone chart for action plan for heart failure.  Written material given at graduation. Flowsheet Row Pulmonary Rehab from 01/24/2021 in [facility] Card[facility] Pulmonary Rehab  Date 01/24/21  Educator [facility]  Instruction Review Code 1- Verbalizes Understanding      Core Components/Risk Factors/Patient Goals Review:    Core Components/Risk Factors/Patient Goals at Discharge (Final Review):    ITP Comments:  ITP Comments    Row Name 01/07/21 1553 01/17/21 1034 01/22/21 1114 01/23/21 1120 02/05/21 1630   ITP Comments Virtual orientation call  completed today. he has an appointment on Date: 01/17/2021 for EP eval and gym Orientation.  Documentation of diagnosis can be found in [facility]  Date: 12/19/2020.   Audrick is a current tobacco user. Intervention for tobacco cessation was provided at the initial medical review. He was asked about readiness to quit and reported he is not ready to quit at this time . Patient was advised and educated about tobacco cessation using combination therapy, tobacco cessation classes, quit line, and quit smoking apps. Patient demonstrated understanding of this material. Staff will continue to provide encouragement and follow up with the patient throughout the program. Completed 6MWT and gym orientation. Initial ITP created and sent for review to Dr. Emily Filbert, Medical Director. First full day of exercise!  Patient was oriented to gym and equipment including functions, settings, policies, and procedures.  Patient's individual exercise prescription and treatment plan were reviewed.  All starting workloads were established based on the results of the 6 minute walk test done at initial orientation visit.  The plan for exercise progression was also introduced and progression will be customized based on patient's performance and goals. 30 Day review completed. Medical Director ITP review done, changes made as directed, and signed approval by Medical Director.  New to program Takoda is currently admitted for respiratory failure.  Spoke with case manager today to get clearance set up for him to return to rehab.   Waldo Name 02/13/21 1005 02/20/21 0630 03/06/21 1423       ITP Comments Tricia is currently on a medical hold due to deconditioning from 9 day hospital stay - enrolling in PT and will restart rehab when done. 30 Day review completed. Medical Director ITP review done, changes made as directed, and signed approval by Medical Director.  Out for medical reasons Pt was admitted last week for AKI and discharged to SNF.  He will need to  recover and see PT prior to being able to return to rehab.  We will discharge him at this time.            Comments: Discharge ITP (only completed 4 sessions)

## 2021-03-07 DIAGNOSIS — J440 Chronic obstructive pulmonary disease with acute lower respiratory infection: Secondary | ICD-10-CM | POA: Diagnosis not present

## 2021-03-12 DIAGNOSIS — E119 Type 2 diabetes mellitus without complications: Secondary | ICD-10-CM | POA: Diagnosis not present

## 2021-03-13 ENCOUNTER — Ambulatory Visit: Payer: PPO | Admitting: Radiation Oncology

## 2021-03-19 DIAGNOSIS — R17 Unspecified jaundice: Secondary | ICD-10-CM | POA: Diagnosis not present

## 2021-03-19 DIAGNOSIS — K648 Other hemorrhoids: Secondary | ICD-10-CM | POA: Diagnosis not present

## 2021-03-19 DIAGNOSIS — R5381 Other malaise: Secondary | ICD-10-CM | POA: Diagnosis not present

## 2021-03-20 DIAGNOSIS — N183 Chronic kidney disease, stage 3 unspecified: Secondary | ICD-10-CM | POA: Diagnosis not present

## 2021-03-20 DIAGNOSIS — I6389 Other cerebral infarction: Secondary | ICD-10-CM | POA: Diagnosis not present

## 2021-03-20 DIAGNOSIS — J984 Other disorders of lung: Secondary | ICD-10-CM | POA: Diagnosis not present

## 2021-03-20 DIAGNOSIS — I5031 Acute diastolic (congestive) heart failure: Secondary | ICD-10-CM | POA: Diagnosis not present

## 2021-03-20 DIAGNOSIS — I480 Paroxysmal atrial fibrillation: Secondary | ICD-10-CM | POA: Diagnosis not present

## 2021-03-20 DIAGNOSIS — G8929 Other chronic pain: Secondary | ICD-10-CM | POA: Diagnosis not present

## 2021-03-21 DIAGNOSIS — Z8673 Personal history of transient ischemic attack (TIA), and cerebral infarction without residual deficits: Secondary | ICD-10-CM | POA: Diagnosis not present

## 2021-03-21 DIAGNOSIS — R278 Other lack of coordination: Secondary | ICD-10-CM | POA: Diagnosis not present

## 2021-03-21 DIAGNOSIS — I10 Essential (primary) hypertension: Secondary | ICD-10-CM | POA: Diagnosis not present

## 2021-03-21 DIAGNOSIS — H539 Unspecified visual disturbance: Secondary | ICD-10-CM | POA: Diagnosis not present

## 2021-03-21 DIAGNOSIS — R262 Difficulty in walking, not elsewhere classified: Secondary | ICD-10-CM | POA: Diagnosis not present

## 2021-03-22 DIAGNOSIS — I5022 Chronic systolic (congestive) heart failure: Secondary | ICD-10-CM | POA: Diagnosis not present

## 2021-03-22 DIAGNOSIS — Z951 Presence of aortocoronary bypass graft: Secondary | ICD-10-CM | POA: Diagnosis not present

## 2021-03-22 DIAGNOSIS — I480 Paroxysmal atrial fibrillation: Secondary | ICD-10-CM | POA: Diagnosis not present

## 2021-03-22 DIAGNOSIS — I350 Nonrheumatic aortic (valve) stenosis: Secondary | ICD-10-CM | POA: Diagnosis not present

## 2021-03-22 DIAGNOSIS — Z8679 Personal history of other diseases of the circulatory system: Secondary | ICD-10-CM | POA: Diagnosis not present

## 2021-03-23 NOTE — Progress Notes (Signed)
Patient ID: [name], male    DOB: 1945/11/27, 75 y.o.   MRN: [id number]  HPI  Mr Mackowski is a 75 y/o male with a history of CAD, DM, hyperlipidemia, HTN, stroke, COPD, GERD, PVD, atrial fibrillation, current tobacco use and chronic heart failure.   Echo report from 11/15/20 reviewed and showed an EF of 50-55% along with mild MR. Echo report from 12/26/19 reviewed and showed an EF of 35-40% along with mild MR and mild mildly elevated PA pressure.   Admitted 02/19/21 due to generalized weakness where he was unable to get himself off of the commode resulting in a fall. Found to be bradycardic, hypotensive, hypoglycemic with significant AKI due to overdiuresis. GI, wound, nephrology and cardiology consults obtained. Placed on lasix gtt with transition to oral diuretics. 1 unit of PRBC's given for anemia. Thoracentesis done with removal of 800cc fluid. Antibiotics given for dental infection. Discharged after 11 days. Admitted 01/28/21 due to COPD exacerbation. Initially placed on bipap but able to be weaned off to nasal cannula. Given steroids and antibiotics. Given IV lasix with transition to oral diuretics. Covid test negative. Given low dose xanax due to anxiety. Discharged after 8 days. Admitted 11/15/20 due to acute shortness of breath along with cough and wheezing. Initially required bipap but then weaned off. Initially given IV lasix with transition to oral diuretics. PT consult obtained due to weakness. Discharged after 4 days. Admitted 10/23/20 due to shortness of breath. Initially needed bipap and supplemental oxygen. Cardiology consult obtained. Given IV lasix with transition to oral diuretics. Weaned off of oxygen. Discharged after 5 days.    He presents today for a follow-up visit with a chief complaint of minimal shortness of breath upon moderate exertion. He describes this as chronic in nature having been present for several years although he does feel like his breathing has improved since his recent  admission. He has associated fatigue and easy bruising along with this. He denies any difficulty sleeping, abdominal distention, palpitations, pedal edema, chest pain, cough, dizziness or weight gain.   Currently at rehab getting PT and is hoping to go home later this week.   Past Medical History:  Diagnosis Date  . Arrhythmia    atrial fibrillation  . Atrial fibrillation ([place])   . Basal cell carcinoma 05/2019   right nasal ala, Tx: EDC  . Basal cell carcinoma 04/12/2019   Left posterior ear. Nodular pattern, excoriated.   . CHF (congestive heart failure) ([place])   . COPD (chronic obstructive pulmonary disease) ([place])   . Coronary artery disease   . Diabetes mellitus without complication ([place])   . GERD (gastroesophageal reflux disease)   . HLD (hyperlipidemia)   . Hypertension   . Peripheral vascular disease ([place])   . Pneumonia   . Prostate cancer ([place])   . Squamous cell carcinoma of skin 08/10/2018   Left upper arm above elbow. [facility] SCC.  Marland Kitchen Squamous cell carcinoma of skin 09/07/2018   Right forearm, below elbow. WD SCC. [facility] 01/10/2019.  Marland Kitchen Stroke [facility])    Past Surgical History:  Procedure Laterality Date  . COLONOSCOPY    . CORONARY ARTERY BYPASS GRAFT  2006  . EYE SURGERY    . GOLD SEED IMPLANT N/A 12/30/2016   Procedure: GOLD SEED IMPLANT  x3;  Surgeon: Hollice Espy, MD;  Location: [facility] ORS;  Service: Urology;  Laterality: N/A;  . HEMORRHOID SURGERY N/A 10/02/2016   Procedure: PROCTOSCOPY AND CONTROL OF RECTAL BLEEDING;  Surgeon: Excell Seltzer, MD;  Location: [facility] ORS;  Service: General;  Laterality: N/A;  . PROSTATE BIOPSY    . TONSILLECTOMY     Family History  Problem Relation Age of Onset  . Lung cancer Mother   . Heart attack Father   . Hypertension Other   . Cancer Maternal Grandfather        prostate  . Cancer Paternal Grandmother        breast   Social History   Tobacco Use  . Smoking status: Current Every Day Smoker    Packs/day: 1.00    Years: 54.00     Pack years: 54.00    Types: Cigarettes  . Smokeless tobacco: Never Used  Substance Use Topics  . Alcohol use: No    Alcohol/week: 0.0 standard drinks   Allergies  Allergen Reactions  . Lactose Intolerance (Gi)     Past Medical History:  Diagnosis Date  . Arrhythmia    atrial fibrillation  . Atrial fibrillation ([place])   . Basal cell carcinoma 05/2019   right nasal ala, Tx: EDC  . Basal cell carcinoma 04/12/2019   Left posterior ear. Nodular pattern, excoriated.   . CHF (congestive heart failure) ([place])   . COPD (chronic obstructive pulmonary disease) ([place])   . Coronary artery disease   . Diabetes mellitus without complication ([place])   . GERD (gastroesophageal reflux disease)   . HLD (hyperlipidemia)   . Hypertension   . Peripheral vascular disease ([place])   . Pneumonia   . Prostate cancer ([place])   . Squamous cell carcinoma of skin 08/10/2018   Left upper arm above elbow. WD SCC.  Marland Kitchen Squamous cell carcinoma of skin 09/07/2018   Right forearm, below elbow. WD SCC. [facility] 01/10/2019.  Marland Kitchen Stroke [facility])    Past Surgical History:  Procedure Laterality Date  . COLONOSCOPY    . CORONARY ARTERY BYPASS GRAFT  2006  . EYE SURGERY    . GOLD SEED IMPLANT N/A 12/30/2016   Procedure: GOLD SEED IMPLANT  x3;  Surgeon: Hollice Espy, MD;  Location: [facility] ORS;  Service: Urology;  Laterality: N/A;  . HEMORRHOID SURGERY N/A 10/02/2016   Procedure: PROCTOSCOPY AND CONTROL OF RECTAL BLEEDING;  Surgeon: Excell Seltzer, MD;  Location: [facility];  Service: General;  Laterality: N/A;  . PROSTATE BIOPSY    . TONSILLECTOMY     Family History  Problem Relation Age of Onset  . Lung cancer Mother   . Heart attack Father   . Hypertension Other   . Cancer Maternal Grandfather        prostate  . Cancer Paternal Grandmother        breast   Social History   Tobacco Use  . Smoking status: Current Every Day Smoker    Packs/day: 1.00    Years: 54.00    Pack years: 54.00    Types: Cigarettes  . Smokeless  tobacco: Never Used  Substance Use Topics  . Alcohol use: No    Alcohol/week: 0.0 standard drinks   Allergies  Allergen Reactions  . Lactose Intolerance (Gi)    Prior to Admission medications   Medication Sig Start Date End Date Taking? Authorizing Provider  acetaminophen (TYLENOL) 325 MG tablet Take 2 tablets (650 mg total) by mouth every 6 (six) hours as needed for mild pain (or Fever >/= 101). 01/20/16  Yes Gouru, Illene Silver, MD  albuterol (PROVENTIL HFA;VENTOLIN HFA) 108 (90 Base) MCG/ACT inhaler Inhale 2 puffs into the lungs every 6 (six) hours as needed for wheezing or shortness  of breath.   Yes [provider]  apixaban (ELIQUIS) 2.5 MG TABS tablet Take 1 tablet (2.5 mg total) by mouth 2 (two) times daily. 03/02/21  Yes Wouk, Ailene Rud, MD  bifidobacterium infantis (ALIGN) capsule Take 1 capsule by mouth every morning.   Yes [provider]  bisacodyl (DULCOLAX) 10 MG suppository Place 10 mg rectally daily as needed for moderate constipation.   Yes [provider]  cholecalciferol (VITAMIN D) 1000 units tablet Take 1,000 Units by mouth daily.   Yes [provider]  Cinnamon 500 MG capsule Take 1,000 mg by mouth 2 (two) times daily.   Yes [provider]  COMBIVENT RESPIMAT 20-100 MCG/ACT AERS respimat Inhale 2 puffs into the lungs 4 (four) times daily as needed. 09/18/20  Yes [provider]  CRANBERRY EXTRACT PO Take 500 mg by mouth every morning.    Yes [provider]  feeding supplement, GLUCERNA SHAKE, (GLUCERNA SHAKE) LIQD Take 237 mLs by mouth 3 (three) times daily between meals.   Yes [provider]  gabapentin (NEURONTIN) 100 MG capsule Take 100 mg by mouth 2 (two) times daily.   Yes [provider]  glucose 4 GM chewable tablet Chew 1 tablet by mouth as needed for low blood sugar.   Yes [provider]  hydrocortisone (ANUSOL-HC) 2.5 % rectal cream Place 1 application rectally 2 (two) times  daily.   Yes [provider]  insulin aspart (NOVOLOG) 100 UNIT/ML injection 0-9 Units, Subcutaneous, 3 times daily with meals CBG < 70: Implement Hypoglycemia protocol/measures CBG 70 - 120: 0 units CBG 121 - 150: 1 unit CBG 151 - 200: 2 units CBG 201 - 250: 3 units CBG 251 - 300: 5 units CBG 301 - 350: 7 units CBG 351 - 400: 9 units CBG > 400: call MD 10/15/19  Yes Ghimire, Henreitta Leber, MD  iron polysaccharides (NIFEREX) 150 MG capsule Take 1 capsule (150 mg total) by mouth daily. Can take any over-the-counter iron supplement. 02/06/21  Yes Enzo Bi, MD  lactase (LACTAID) 3000 units tablet Take 3,000 Units by mouth 3 (three) times daily between meals as needed.   Yes [provider]  LANTUS SOLOSTAR 100 UNIT/ML Solostar Pen Inject 15 Units into the skin daily at 10 pm. Patient taking differently: Inject 20 Units into the skin daily at 10 pm. 03/02/21  Yes Wouk, Ailene Rud, MD  magnesium oxide (MAG-OX) 400 MG tablet Take 400 mg by mouth daily.   Yes [provider]  metolazone (ZAROXOLYN) 2.5 MG tablet Take 1 tablet (2.5 mg total) by mouth daily. 03/03/21  Yes Wouk, Ailene Rud, MD  Multiple Vitamin (MULTIVITAMIN WITH MINERALS) TABS tablet Take 1 tablet by mouth daily.   Yes [provider]  nicotine (NICODERM CQ - DOSED IN MG/24 HOURS) 21 mg/24hr patch Place 21 mg onto the skin daily.   Yes [provider]  omeprazole (PRILOSEC) 20 MG capsule Take 20 mg by mouth daily.   Yes [provider]  simvastatin (ZOCOR) 20 MG tablet Take 20 mg by mouth in the morning.   Yes [provider]  SUPER B COMPLEX/C PO Take 1 tablet by mouth every morning.   Yes [provider]  tiotropium (SPIRIVA HANDIHALER) 18 MCG inhalation capsule Place 1 capsule (18 mcg total) into inhaler and inhale daily. 02/05/21 05/06/21 Yes Enzo Bi, MD  torsemide (DEMADEX) 20 MG tablet Take 40 mg by mouth daily.   Yes [provider]  traZODone (DESYREL)  50 MG tablet Take 50 mg by mouth at bedtime.   Yes [provider]  vitamin C (ASCORBIC ACID) 500 MG tablet Take 500 mg by mouth 2 (two) times daily.    Yes [provider]  Ensure Max Protein (ENSURE MAX PROTEIN) LIQD Take 330 mLs (11 oz total) by mouth 2 (two) times daily between meals. Patient not taking: Reported on 03/25/2021 12/29/19   Nicole Kindred A, DO  multivitamin-lutein [facility]) CAPS capsule Take 1 capsule by mouth daily. Patient not taking: Reported on 03/25/2021    [provider]    Review of Systems  Constitutional: Positive for fatigue. Negative for appetite change.  HENT: Positive for congestion. Negative for postnasal drip and sore throat.   Eyes: Negative.   Respiratory: Positive for shortness of breath. Negative for cough and chest tightness.   Cardiovascular: Negative for chest pain, palpitations and leg swelling.  Gastrointestinal: Negative for abdominal distention and abdominal pain.  Endocrine: Negative.   Genitourinary: Negative.   Musculoskeletal: Negative for back pain and neck pain.  Skin: Negative.   Allergic/Immunologic: Negative.   Neurological: Negative.  Negative for dizziness and light-headedness.  Hematological: Negative for adenopathy. Bruises/bleeds easily.  Psychiatric/Behavioral: Negative for dysphoric mood and sleep disturbance (sleeping on 1 pillow). The patient is not nervous/anxious.    Vitals:   03/25/21 1055  BP: 136/60  Pulse: (!) 54  Resp: 16  SpO2: 91%  Weight: 145 lb (65.8 kg)  Height: 5\' 6"  (1.676 m)   Wt Readings from Last 3 Encounters:  03/25/21 145 lb (65.8 kg)  03/02/21 160 lb 1.6 oz (72.6 kg)  02/15/21 164 lb (74.4 kg)   Lab Results  Component Value Date   CREATININE 2.31 (H) 03/02/2021   CREATININE 2.34 (H) 03/01/2021   CREATININE 2.17 (H) 02/28/2021   Physical Exam Vitals and nursing note reviewed. Exam conducted with a chaperone present (wife).  Constitutional:      Appearance:  Normal appearance.  HENT:     Head: Normocephalic and atraumatic.  Cardiovascular:     Rate and Rhythm: Normal rate. Rhythm irregular.  Pulmonary:     Effort: Pulmonary effort is normal.     Breath sounds: No wheezing or rales.  Abdominal:     General: Abdomen is flat. There is no distension.     Palpations: Abdomen is soft.  Musculoskeletal:        General: No tenderness.     Cervical back: Normal range of motion and neck supple.     Right lower leg: No edema.     Left lower leg: No tenderness. No edema.  Skin:    General: Skin is warm and dry.  Neurological:     General: No focal deficit present.     Mental Status: He is alert and oriented to person, place, and time.  Psychiatric:        Mood and Affect: Mood normal.        Behavior: Behavior normal.        Thought Content: Thought content normal.    Assessment & Plan:  1: Chronic heart failure with preserved ejection fraction without structural changes- - NYHA class II - euvolemic today - getting weighed daily at rehab; weight range from 145-147 pounds; reminded to weigh daily once home and call for an overnight weight gain of >2 pounds or a weekly weight gain of >5 pounds - weight down 21 pounds from last visit here 6 weeks ago - has not been adding salt  -  reports keeping his daily fluid intake to 64 ounces/ day - saw cardiology (Fath) 03/22/21 - BNP 01/28/21 was 1082.4 - PharmD reconciled medications with the patient and his wife  2: HTN- - BP looks good today; facility BP is 130-170/ 70's - saw PCP Baldemar Lenis) 02/07/21; returns 03/26/21 - BMP 03/02/21 reviewed and showed sodium 135, potassium 3.3, creatinine 2.31 and GFR 29  3: DM- - A1c 01/28/21 was 6.8% - glucose at facility ranges from 144-385 over the last week  4: COPD/ Tobacco use- - no longer smoking and is wearing nicotine patch - congratulated him on this and complete cessation encouraged - saw pulmonology Lanney Gins) 03/07/21  5: Lymphedema- - stage 2 -  resolved   Facility medication list reviewed.   Return in 1 month or sooner for any questions/problems before then.

## 2021-03-25 ENCOUNTER — Encounter: Payer: Self-pay | Admitting: Pharmacist

## 2021-03-25 ENCOUNTER — Other Ambulatory Visit: Payer: Self-pay

## 2021-03-25 ENCOUNTER — Encounter: Payer: Self-pay | Admitting: Family

## 2021-03-25 ENCOUNTER — Ambulatory Visit: Payer: PPO | Attending: Family | Admitting: Family

## 2021-03-25 VITALS — BP 136/60 | HR 54 | Resp 16 | Ht 66.0 in | Wt 145.0 lb

## 2021-03-25 DIAGNOSIS — E785 Hyperlipidemia, unspecified: Secondary | ICD-10-CM | POA: Diagnosis not present

## 2021-03-25 DIAGNOSIS — Z794 Long term (current) use of insulin: Secondary | ICD-10-CM | POA: Diagnosis not present

## 2021-03-25 DIAGNOSIS — Z7901 Long term (current) use of anticoagulants: Secondary | ICD-10-CM | POA: Diagnosis not present

## 2021-03-25 DIAGNOSIS — I509 Heart failure, unspecified: Secondary | ICD-10-CM | POA: Insufficient documentation

## 2021-03-25 DIAGNOSIS — Z79899 Other long term (current) drug therapy: Secondary | ICD-10-CM | POA: Insufficient documentation

## 2021-03-25 DIAGNOSIS — I4891 Unspecified atrial fibrillation: Secondary | ICD-10-CM | POA: Diagnosis not present

## 2021-03-25 DIAGNOSIS — I2510 Atherosclerotic heart disease of native coronary artery without angina pectoris: Secondary | ICD-10-CM | POA: Diagnosis not present

## 2021-03-25 DIAGNOSIS — I10 Essential (primary) hypertension: Secondary | ICD-10-CM

## 2021-03-25 DIAGNOSIS — Z87891 Personal history of nicotine dependence: Secondary | ICD-10-CM | POA: Diagnosis not present

## 2021-03-25 DIAGNOSIS — I890 Lymphedema, not elsewhere classified: Secondary | ICD-10-CM

## 2021-03-25 DIAGNOSIS — I110 Hypertensive heart disease with heart failure: Secondary | ICD-10-CM | POA: Diagnosis not present

## 2021-03-25 DIAGNOSIS — J449 Chronic obstructive pulmonary disease, unspecified: Secondary | ICD-10-CM | POA: Diagnosis not present

## 2021-03-25 DIAGNOSIS — B351 Tinea unguium: Secondary | ICD-10-CM | POA: Diagnosis not present

## 2021-03-25 DIAGNOSIS — E1140 Type 2 diabetes mellitus with diabetic neuropathy, unspecified: Secondary | ICD-10-CM | POA: Diagnosis not present

## 2021-03-25 DIAGNOSIS — E1151 Type 2 diabetes mellitus with diabetic peripheral angiopathy without gangrene: Secondary | ICD-10-CM | POA: Diagnosis not present

## 2021-03-25 DIAGNOSIS — E1122 Type 2 diabetes mellitus with diabetic chronic kidney disease: Secondary | ICD-10-CM

## 2021-03-25 DIAGNOSIS — I5032 Chronic diastolic (congestive) heart failure: Secondary | ICD-10-CM

## 2021-03-25 DIAGNOSIS — Z720 Tobacco use: Secondary | ICD-10-CM

## 2021-03-25 DIAGNOSIS — N1832 Chronic kidney disease, stage 3b: Secondary | ICD-10-CM

## 2021-03-25 NOTE — Progress Notes (Signed)
[place] - PHARMACIST COUNSELING NOTE  Guideline-Directed Medical Therapy/Evidence Based Medicine  ACE/ARB/ARNI: None Beta Blocker: None Aldosterone Antagonist: None Diuretic: furosemide (LASIX) 40 mg every day metolazone (ZAROXOLYN) 2.5 mg every day - (give 30 minutes before any loop diuretic) SGLT2i: None  Adherence Assessment   Adherence strategy: Pt currently at a rehab facility   Do you ever forget to take your medication? [] Yes [x] No  Do you ever skip doses due to side effects? [] Yes [x] No  Do you have trouble affording your medicines? [] Yes [x] No  Are you ever unable to pick up your medication due to transportation difficulties? [] Yes [x] No  Do you ever stop taking your medications because you don't believe they are helping? [] Yes [x] No   Recommendations given to patient about increasing adherence: Pt currently at a rehab facility and denies any barriers to obtaining medications     Vital signs: HR 54, BP 136/60, weight (pounds) 145 ECHO: Date 11/15/20, EF 50-55% (improved from 12/2019 echo, EF 35-50%)  BMP Latest Ref Rng & Units 03/02/2021 03/01/2021 02/28/2021  Glucose 70 - 99 mg/dL 111(H) 158(H) 94  BUN 8 - 23 mg/dL 37(H) 42(H) 37(H)  Creatinine 0.61 - 1.24 mg/dL 2.31(H) 2.34(H) 2.17(H)  Sodium 135 - 145 mmol/L 135 135 136  Potassium 3.5 - 5.1 mmol/L 3.3(L) 3.6 3.5  Chloride 98 - 111 mmol/L 90(L) 91(L) 93(L)  CO2 22 - 32 mmol/L 34(H) 32 32  Calcium 8.9 - 10.3 mg/dL 8.8(L) 8.6(L) 8.7(L)    Past Medical History:  Diagnosis Date  . Arrhythmia    atrial fibrillation  . Atrial fibrillation ([place])   . Basal cell carcinoma 05/2019   right nasal ala, Tx: [facility]  . Basal cell carcinoma 04/12/2019   Left posterior ear. Nodular pattern, excoriated.   . CHF (congestive heart failure) ([place])   . COPD (chronic obstructive pulmonary disease) ([place])   . Coronary artery disease   . Diabetes mellitus without complication ([place])   .  GERD (gastroesophageal reflux disease)   . HLD (hyperlipidemia)   . Hypertension   . Peripheral vascular disease ([place])   . Pneumonia   . Prostate cancer ([place])   . Squamous cell carcinoma of skin 08/10/2018   Left upper arm above elbow. [facility] SCC.  Marland Kitchen Squamous cell carcinoma of skin 09/07/2018   Right forearm, below elbow. [facility] SCC. [facility] 01/10/2019.  Marland Kitchen Stroke [facility])     ASSESSMENT 75 year old male who presents to the HF clinic for a follow up visit. Pt is currently at a rehab facility. Documentation was brought in that had labs/vitals for BP (range 116-170/46-79), weight, and BG (150s-low 300s) - lantus was recently increased to 20 units daily from 15 units daily. Pt denies any symptoms for hypo- or hypertension. Pt weight and vitals are checked daily at the facility. Pt does report using PRN albuterol inhaler 1-2 times daily.   Recent ED Visit (past 6 months): Date: 02/19/2021, CC: AKI  PLAN CHF -continue torsemide 40 mg daily and metolazone 2.5mg  daily -Last K 3.3 (03/02/2021); if remains low (pending labs from rehab facility), consider addition of KCl supplement -continue to check weights daily  Afib -continue apixaban 2.5mg  BID -pt not on BB due to bradycardia; can consider restarting if HR tolerates   HTN -continue meds as listed in CHF -continue to check BP daily  DM/HLD -continue SSI and lantus 20 units daily; consider increasing dose if BG remains elevated -continue gabapentin 100mg  BID for neuropathy -continue simvastatin  20mg  daily; could consider switching to high intensity statin  COPD -continue PRN albuterol, combivent respimat, and spiriva -educated pt on uses for inhalers and if continues to need PRN albuterol to follow up with PCP to assess need for change in maintenance inhaler  GERD -continue omeprazole 20mg  daily   Time spent: 10 minutes  Sherilyn Banker, PharmD Pharmacy Resident  03/25/2021 11:35 AM    Current Outpatient Medications:  .  acetaminophen  (TYLENOL) 325 MG tablet, Take 2 tablets (650 mg total) by mouth every 6 (six) hours as needed for mild pain (or Fever >/= 101)., Disp: , Rfl:  .  albuterol (PROVENTIL HFA;VENTOLIN HFA) 108 (90 Base) MCG/ACT inhaler, Inhale 2 puffs into the lungs every 6 (six) hours as needed for wheezing or shortness of breath., Disp: , Rfl:  .  apixaban (ELIQUIS) 2.5 MG TABS tablet, Take 1 tablet (2.5 mg total) by mouth 2 (two) times daily., Disp: 60 tablet, Rfl:  .  bifidobacterium infantis (ALIGN) capsule, Take 1 capsule by mouth every morning., Disp: , Rfl:  .  bisacodyl (DULCOLAX) 10 MG suppository, Place 10 mg rectally daily as needed for moderate constipation., Disp: , Rfl:  .  cholecalciferol (VITAMIN D) 1000 units tablet, Take 1,000 Units by mouth daily., Disp: , Rfl:  .  Cinnamon 500 MG capsule, Take 1,000 mg by mouth 2 (two) times daily., Disp: , Rfl:  .  COMBIVENT RESPIMAT 20-100 MCG/ACT AERS respimat, Inhale 2 puffs into the lungs 4 (four) times daily as needed., Disp: , Rfl:  .  CRANBERRY EXTRACT PO, Take 500 mg by mouth every morning. , Disp: , Rfl:  .  Ensure Max Protein (ENSURE MAX PROTEIN) LIQD, Take 330 mLs (11 oz total) by mouth 2 (two) times daily between meals. (Patient not taking: Reported on 03/25/2021), Disp:  , Rfl:  .  feeding supplement, GLUCERNA SHAKE, (GLUCERNA SHAKE) LIQD, Take 237 mLs by mouth 3 (three) times daily between meals., Disp: , Rfl:  .  gabapentin (NEURONTIN) 100 MG capsule, Take 100 mg by mouth 2 (two) times daily., Disp: , Rfl:  .  glucose 4 GM chewable tablet, Chew 1 tablet by mouth as needed for low blood sugar., Disp: , Rfl:  .  hydrocortisone (ANUSOL-HC) 2.5 % rectal cream, Place 1 application rectally 2 (two) times daily., Disp: , Rfl:  .  insulin aspart (NOVOLOG) 100 UNIT/ML injection, 0-9 Units, Subcutaneous, 3 times daily with meals CBG < 70: Implement Hypoglycemia protocol/measures CBG 70 - 120: 0 units CBG 121 - 150: 1 unit CBG 151 - 200: 2 units CBG 201 - 250: 3  units CBG 251 - 300: 5 units CBG 301 - 350: 7 units CBG 351 - 400: 9 units CBG > 400: call MD, Disp: 10 mL, Rfl: 11 .  iron polysaccharides (NIFEREX) 150 MG capsule, Take 1 capsule (150 mg total) by mouth daily. Can take any over-the-counter iron supplement., Disp: , Rfl:  .  lactase (LACTAID) 3000 units tablet, Take 3,000 Units by mouth 3 (three) times daily between meals as needed., Disp: , Rfl:  .  LANTUS SOLOSTAR 100 UNIT/ML Solostar Pen, Inject 15 Units into the skin daily at 10 pm. (Patient taking differently: Inject 20 Units into the skin daily at 10 pm.), Disp: 15 mL, Rfl: 11 .  magnesium oxide (MAG-OX) 400 MG tablet, Take 400 mg by mouth daily., Disp: , Rfl:  .  metolazone (ZAROXOLYN) 2.5 MG tablet, Take 1 tablet (2.5 mg total) by mouth daily., Disp: , Rfl:  .  Multiple Vitamin (MULTIVITAMIN WITH MINERALS) TABS tablet, Take 1 tablet by mouth daily., Disp: , Rfl:  .  multivitamin-lutein (OCUVITE-LUTEIN) CAPS capsule, Take 1 capsule by mouth daily. (Patient not taking: Reported on 03/25/2021), Disp: , Rfl:  .  nicotine (NICODERM CQ - DOSED IN MG/24 HOURS) 21 mg/24hr patch, Place 21 mg onto the skin daily., Disp: , Rfl:  .  omeprazole (PRILOSEC) 20 MG capsule, Take 20 mg by mouth daily., Disp: , Rfl:  .  simvastatin (ZOCOR) 20 MG tablet, Take 20 mg by mouth in the morning., Disp: , Rfl:  .  SUPER B COMPLEX/C PO, Take 1 tablet by mouth every morning., Disp: , Rfl:  .  tiotropium (SPIRIVA HANDIHALER) 18 MCG inhalation capsule, Place 1 capsule (18 mcg total) into inhaler and inhale daily., Disp: 30 capsule, Rfl: 2 .  torsemide (DEMADEX) 20 MG tablet, Take 40 mg by mouth daily., Disp: , Rfl:  .  traZODone (DESYREL) 50 MG tablet, Take 50 mg by mouth at bedtime., Disp: , Rfl:  .  vitamin C (ASCORBIC ACID) 500 MG tablet, Take 500 mg by mouth 2 (two) times daily. , Disp: , Rfl:    COUNSELING POINTS/CLINICAL PEARLS Torsemide  Side effects may include excessive urination.  Tell patient to report  symptoms of ototoxicity.  Instruct patient to report lightheadedness or syncope.  Warn patient to avoid use of nonprescription NSAID products without first discussing it with their healthcare provider.  DRUGS TO AVOID IN HEART FAILURE  Drug or Class Mechanism  Analgesics . NSAIDs . COX-2 inhibitors . Glucocorticoids  Sodium and water retention, increased systemic vascular resistance, decreased response to diuretics   Diabetes Medications . Metformin . Thiazolidinediones o Rosiglitazone (Avandia) o Pioglitazone (Actos) . DPP4 Inhibitors o Saxagliptin (Onglyza) o Sitagliptin (Januvia)   Lactic acidosis Possible calcium channel blockade   Unknown  Antiarrhythmics . Class I  o Flecainide o Disopyramide . Class III o Sotalol . Other o Dronedarone  Negative inotrope, proarrhythmic   Proarrhythmic, beta blockade  Negative inotrope  Antihypertensives . Alpha Blockers o Doxazosin . Calcium Channel Blockers o Diltiazem o Verapamil o Nifedipine . Central Alpha Adrenergics o Moxonidine . Peripheral Vasodilators o Minoxidil  Increases renin and aldosterone  Negative inotrope    Possible sympathetic withdrawal  Unknown  Anti-infective . Itraconazole . Amphotericin B  Negative inotrope Unknown  Hematologic . Anagrelide . Cilostazol   Possible inhibition of PD IV Inhibition of PD III causing arrhythmias  Neurologic/Psychiatric . Stimulants . Anti-Seizure Drugs o Carbamazepine o Pregabalin . Antidepressants o Tricyclics o Citalopram . Parkinsons o Bromocriptine o Pergolide o Pramipexole . Antipsychotics o Clozapine . Antimigraine o Ergotamine o Methysergide . Appetite suppressants . Bipolar o Lithium  Peripheral alpha and beta agonist activity  Negative inotrope and chronotrope Calcium channel blockade  Negative inotrope, proarrhythmic Dose-dependent QT prolongation  Excessive serotonin activity/valvular damage Excessive serotonin  activity/valvular damage Unknown  IgE mediated hypersensitivy, calcium channel blockade  Excessive serotonin activity/valvular damage Excessive serotonin activity/valvular damage Valvular damage  Direct myofibrillar degeneration, adrenergic stimulation  Antimalarials . Chloroquine . Hydroxychloroquine Intracellular inhibition of lysosomal enzymes  Urologic Agents . Alpha Blockers o Doxazosin o Prazosin o Tamsulosin o Terazosin  Increased renin and aldosterone  Adapted from Page RL, et al. "Drugs That May Cause or Exacerbate Heart Failure: A Scientific Statement from the [employer]." Circulation 2016; 093:A35-T73. DOI: 10.1161/CIR.0000000000000426   MEDICATION ADHERENCES TIPS AND STRATEGIES 1. Taking medication as prescribed improves patient outcomes in heart failure (reduces hospitalizations, improves symptoms,  increases survival) 2. Side effects of medications can be managed by decreasing doses, switching agents, stopping drugs, or adding additional therapy. Please let someone in the [facility] know if you have having bothersome side effects so we can modify your regimen. Do not alter your medication regimen without talking to [place].  3. Medication reminders can help patients remember to take drugs on time. If you are missing or forgetting doses you can try linking behaviors, using pill boxes, or an electronic reminder like an alarm on your phone or an app. Some people can also get automated phone calls as medication reminders.

## 2021-03-25 NOTE — Patient Instructions (Signed)
Continue weighing daily and call for an overnight weight gain of > 2 pounds or a weekly weight gain of >5 pounds. 

## 2021-03-28 DIAGNOSIS — I639 Cerebral infarction, unspecified: Secondary | ICD-10-CM | POA: Diagnosis not present

## 2021-03-28 DIAGNOSIS — E440 Moderate protein-calorie malnutrition: Secondary | ICD-10-CM | POA: Diagnosis not present

## 2021-03-28 DIAGNOSIS — I5030 Unspecified diastolic (congestive) heart failure: Secondary | ICD-10-CM | POA: Diagnosis not present

## 2021-03-28 DIAGNOSIS — R5381 Other malaise: Secondary | ICD-10-CM | POA: Diagnosis not present

## 2021-04-02 ENCOUNTER — Other Ambulatory Visit: Payer: Self-pay

## 2021-04-02 ENCOUNTER — Inpatient Hospital Stay
Admission: EM | Admit: 2021-04-02 | Discharge: 2021-04-08 | DRG: 682 | Disposition: A | Discharge status: Skilled Nursing Facility | Payer: PPO | Attending: Internal Medicine | Admitting: Internal Medicine

## 2021-04-02 ENCOUNTER — Emergency Department: Payer: PPO

## 2021-04-02 DIAGNOSIS — J811 Chronic pulmonary edema: Secondary | ICD-10-CM | POA: Diagnosis not present

## 2021-04-02 DIAGNOSIS — K219 Gastro-esophageal reflux disease without esophagitis: Secondary | ICD-10-CM | POA: Diagnosis present

## 2021-04-02 DIAGNOSIS — Z7901 Long term (current) use of anticoagulants: Secondary | ICD-10-CM | POA: Diagnosis not present

## 2021-04-02 DIAGNOSIS — E1122 Type 2 diabetes mellitus with diabetic chronic kidney disease: Secondary | ICD-10-CM | POA: Diagnosis present

## 2021-04-02 DIAGNOSIS — R410 Disorientation, unspecified: Secondary | ICD-10-CM | POA: Diagnosis not present

## 2021-04-02 DIAGNOSIS — E8770 Fluid overload, unspecified: Secondary | ICD-10-CM | POA: Diagnosis not present

## 2021-04-02 DIAGNOSIS — E872 Acidosis: Secondary | ICD-10-CM | POA: Diagnosis present

## 2021-04-02 DIAGNOSIS — J439 Emphysema, unspecified: Secondary | ICD-10-CM | POA: Diagnosis not present

## 2021-04-02 DIAGNOSIS — Z7401 Bed confinement status: Secondary | ICD-10-CM | POA: Diagnosis not present

## 2021-04-02 DIAGNOSIS — M6259 Muscle wasting and atrophy, not elsewhere classified, multiple sites: Secondary | ICD-10-CM | POA: Diagnosis not present

## 2021-04-02 DIAGNOSIS — R778 Other specified abnormalities of plasma proteins: Secondary | ICD-10-CM | POA: Diagnosis not present

## 2021-04-02 DIAGNOSIS — Z8673 Personal history of transient ischemic attack (TIA), and cerebral infarction without residual deficits: Secondary | ICD-10-CM

## 2021-04-02 DIAGNOSIS — I130 Hypertensive heart and chronic kidney disease with heart failure and stage 1 through stage 4 chronic kidney disease, or unspecified chronic kidney disease: Secondary | ICD-10-CM | POA: Diagnosis present

## 2021-04-02 DIAGNOSIS — I209 Angina pectoris, unspecified: Secondary | ICD-10-CM | POA: Diagnosis not present

## 2021-04-02 DIAGNOSIS — Z79899 Other long term (current) drug therapy: Secondary | ICD-10-CM

## 2021-04-02 DIAGNOSIS — E1151 Type 2 diabetes mellitus with diabetic peripheral angiopathy without gangrene: Secondary | ICD-10-CM | POA: Diagnosis not present

## 2021-04-02 DIAGNOSIS — I482 Chronic atrial fibrillation, unspecified: Secondary | ICD-10-CM | POA: Diagnosis present

## 2021-04-02 DIAGNOSIS — E860 Dehydration: Secondary | ICD-10-CM | POA: Diagnosis not present

## 2021-04-02 DIAGNOSIS — K644 Residual hemorrhoidal skin tags: Secondary | ICD-10-CM | POA: Diagnosis not present

## 2021-04-02 DIAGNOSIS — Z20822 Contact with and (suspected) exposure to covid-19: Secondary | ICD-10-CM | POA: Diagnosis not present

## 2021-04-02 DIAGNOSIS — N1832 Chronic kidney disease, stage 3b: Secondary | ICD-10-CM | POA: Diagnosis present

## 2021-04-02 DIAGNOSIS — R279 Unspecified lack of coordination: Secondary | ICD-10-CM | POA: Diagnosis not present

## 2021-04-02 DIAGNOSIS — Z8249 Family history of ischemic heart disease and other diseases of the circulatory system: Secondary | ICD-10-CM

## 2021-04-02 DIAGNOSIS — R0602 Shortness of breath: Secondary | ICD-10-CM

## 2021-04-02 DIAGNOSIS — E785 Hyperlipidemia, unspecified: Secondary | ICD-10-CM | POA: Diagnosis not present

## 2021-04-02 DIAGNOSIS — D631 Anemia in chronic kidney disease: Secondary | ICD-10-CM | POA: Diagnosis present

## 2021-04-02 DIAGNOSIS — J9611 Chronic respiratory failure with hypoxia: Secondary | ICD-10-CM | POA: Diagnosis not present

## 2021-04-02 DIAGNOSIS — C61 Malignant neoplasm of prostate: Secondary | ICD-10-CM | POA: Diagnosis not present

## 2021-04-02 DIAGNOSIS — R41841 Cognitive communication deficit: Secondary | ICD-10-CM | POA: Diagnosis not present

## 2021-04-02 DIAGNOSIS — E568 Deficiency of other vitamins: Secondary | ICD-10-CM | POA: Diagnosis not present

## 2021-04-02 DIAGNOSIS — M6281 Muscle weakness (generalized): Secondary | ICD-10-CM | POA: Diagnosis not present

## 2021-04-02 DIAGNOSIS — I10 Essential (primary) hypertension: Secondary | ICD-10-CM | POA: Diagnosis not present

## 2021-04-02 DIAGNOSIS — I959 Hypotension, unspecified: Secondary | ICD-10-CM | POA: Diagnosis not present

## 2021-04-02 DIAGNOSIS — R5381 Other malaise: Secondary | ICD-10-CM | POA: Diagnosis not present

## 2021-04-02 DIAGNOSIS — I214 Non-ST elevation (NSTEMI) myocardial infarction: Secondary | ICD-10-CM | POA: Diagnosis not present

## 2021-04-02 DIAGNOSIS — E739 Lactose intolerance, unspecified: Secondary | ICD-10-CM | POA: Diagnosis present

## 2021-04-02 DIAGNOSIS — J4489 Other specified chronic obstructive pulmonary disease: Secondary | ICD-10-CM | POA: Diagnosis present

## 2021-04-02 DIAGNOSIS — Z8616 Personal history of COVID-19: Secondary | ICD-10-CM | POA: Diagnosis not present

## 2021-04-02 DIAGNOSIS — Z794 Long term (current) use of insulin: Secondary | ICD-10-CM

## 2021-04-02 DIAGNOSIS — I5022 Chronic systolic (congestive) heart failure: Secondary | ICD-10-CM | POA: Diagnosis present

## 2021-04-02 DIAGNOSIS — E11649 Type 2 diabetes mellitus with hypoglycemia without coma: Secondary | ICD-10-CM | POA: Diagnosis not present

## 2021-04-02 DIAGNOSIS — I2510 Atherosclerotic heart disease of native coronary artery without angina pectoris: Secondary | ICD-10-CM | POA: Diagnosis present

## 2021-04-02 DIAGNOSIS — I129 Hypertensive chronic kidney disease with stage 1 through stage 4 chronic kidney disease, or unspecified chronic kidney disease: Secondary | ICD-10-CM | POA: Diagnosis not present

## 2021-04-02 DIAGNOSIS — R1311 Dysphagia, oral phase: Secondary | ICD-10-CM | POA: Diagnosis not present

## 2021-04-02 DIAGNOSIS — R296 Repeated falls: Secondary | ICD-10-CM | POA: Diagnosis present

## 2021-04-02 DIAGNOSIS — Z951 Presence of aortocoronary bypass graft: Secondary | ICD-10-CM

## 2021-04-02 DIAGNOSIS — R531 Weakness: Secondary | ICD-10-CM

## 2021-04-02 DIAGNOSIS — Z8701 Personal history of pneumonia (recurrent): Secondary | ICD-10-CM

## 2021-04-02 DIAGNOSIS — N184 Chronic kidney disease, stage 4 (severe): Secondary | ICD-10-CM | POA: Diagnosis not present

## 2021-04-02 DIAGNOSIS — I517 Cardiomegaly: Secondary | ICD-10-CM | POA: Diagnosis not present

## 2021-04-02 DIAGNOSIS — E871 Hypo-osmolality and hyponatremia: Secondary | ICD-10-CM | POA: Diagnosis not present

## 2021-04-02 DIAGNOSIS — L89152 Pressure ulcer of sacral region, stage 2: Secondary | ICD-10-CM | POA: Diagnosis not present

## 2021-04-02 DIAGNOSIS — K649 Unspecified hemorrhoids: Secondary | ICD-10-CM | POA: Diagnosis not present

## 2021-04-02 DIAGNOSIS — J9600 Acute respiratory failure, unspecified whether with hypoxia or hypercapnia: Secondary | ICD-10-CM | POA: Diagnosis not present

## 2021-04-02 DIAGNOSIS — R2689 Other abnormalities of gait and mobility: Secondary | ICD-10-CM | POA: Diagnosis not present

## 2021-04-02 DIAGNOSIS — J90 Pleural effusion, not elsewhere classified: Secondary | ICD-10-CM | POA: Diagnosis not present

## 2021-04-02 DIAGNOSIS — Z7951 Long term (current) use of inhaled steroids: Secondary | ICD-10-CM

## 2021-04-02 DIAGNOSIS — R7989 Other specified abnormal findings of blood chemistry: Secondary | ICD-10-CM

## 2021-04-02 DIAGNOSIS — N179 Acute kidney failure, unspecified: Principal | ICD-10-CM | POA: Diagnosis present

## 2021-04-02 DIAGNOSIS — R4182 Altered mental status, unspecified: Secondary | ICD-10-CM | POA: Diagnosis not present

## 2021-04-02 DIAGNOSIS — Z85828 Personal history of other malignant neoplasm of skin: Secondary | ICD-10-CM

## 2021-04-02 DIAGNOSIS — J449 Chronic obstructive pulmonary disease, unspecified: Secondary | ICD-10-CM | POA: Diagnosis not present

## 2021-04-02 DIAGNOSIS — E1165 Type 2 diabetes mellitus with hyperglycemia: Secondary | ICD-10-CM | POA: Diagnosis not present

## 2021-04-02 DIAGNOSIS — I5023 Acute on chronic systolic (congestive) heart failure: Secondary | ICD-10-CM | POA: Diagnosis not present

## 2021-04-02 DIAGNOSIS — J9811 Atelectasis: Secondary | ICD-10-CM | POA: Diagnosis not present

## 2021-04-02 DIAGNOSIS — E440 Moderate protein-calorie malnutrition: Secondary | ICD-10-CM | POA: Diagnosis not present

## 2021-04-02 DIAGNOSIS — J9621 Acute and chronic respiratory failure with hypoxia: Secondary | ICD-10-CM | POA: Diagnosis not present

## 2021-04-02 DIAGNOSIS — Z8546 Personal history of malignant neoplasm of prostate: Secondary | ICD-10-CM

## 2021-04-02 LAB — PROTIME-INR
INR: 1.3 — ABNORMAL HIGH (ref 0.8–1.2)
Prothrombin Time: 16.2 seconds — ABNORMAL HIGH (ref 11.4–15.2)

## 2021-04-02 LAB — URINALYSIS, COMPLETE (UACMP) WITH MICROSCOPIC
Bilirubin Urine: NEGATIVE
Glucose, UA: NEGATIVE mg/dL
Hgb urine dipstick: NEGATIVE
Ketones, ur: NEGATIVE mg/dL
Leukocytes,Ua: NEGATIVE
Nitrite: NEGATIVE
Protein, ur: NEGATIVE mg/dL
Specific Gravity, Urine: 1.015 (ref 1.005–1.030)
Squamous Epithelial / HPF: NONE SEEN (ref 0–5)
pH: 5 (ref 5.0–8.0)

## 2021-04-02 LAB — HEPATIC FUNCTION PANEL
ALT: 14 U/L (ref 0–44)
AST: 44 U/L — ABNORMAL HIGH (ref 15–41)
Albumin: 3.5 g/dL (ref 3.5–5.0)
Alkaline Phosphatase: 109 U/L (ref 38–126)
Bilirubin, Direct: 0.1 mg/dL (ref 0.0–0.2)
Indirect Bilirubin: 0.7 mg/dL (ref 0.3–0.9)
Total Bilirubin: 0.8 mg/dL (ref 0.3–1.2)
Total Protein: 6.5 g/dL (ref 6.5–8.1)

## 2021-04-02 LAB — BASIC METABOLIC PANEL
Anion gap: 22 — ABNORMAL HIGH (ref 5–15)
BUN: 114 mg/dL — ABNORMAL HIGH (ref 8–23)
CO2: 20 mmol/L — ABNORMAL LOW (ref 22–32)
Calcium: 8.5 mg/dL — ABNORMAL LOW (ref 8.9–10.3)
Chloride: 73 mmol/L — ABNORMAL LOW (ref 98–111)
Creatinine, Ser: 5.48 mg/dL — ABNORMAL HIGH (ref 0.61–1.24)
GFR, Estimated: 10 mL/min — ABNORMAL LOW (ref >60)
Glucose, Bld: 192 mg/dL — ABNORMAL HIGH (ref 70–99)
Potassium: 5 mmol/L (ref 3.5–5.1)
Sodium: 115 mmol/L — CL (ref 135–145)

## 2021-04-02 LAB — FUNGUS CULTURE WITH STAIN

## 2021-04-02 LAB — FUNGAL ORGANISM REFLEX

## 2021-04-02 LAB — CBC
HCT: 23.1 % — ABNORMAL LOW (ref 39.0–52.0)
Hemoglobin: 7.3 g/dL — ABNORMAL LOW (ref 13.0–17.0)
MCH: 23.2 pg — ABNORMAL LOW (ref 26.0–34.0)
MCHC: 31.6 g/dL (ref 30.0–36.0)
MCV: 73.6 fL — ABNORMAL LOW (ref 80.0–100.0)
Platelets: 273 10*3/uL (ref 150–400)
RBC: 3.14 MIL/uL — ABNORMAL LOW (ref 4.22–5.81)
RDW: 17.5 % — ABNORMAL HIGH (ref 11.5–15.5)
WBC: 17.5 10*3/uL — ABNORMAL HIGH (ref 4.0–10.5)
nRBC: 0 % (ref 0.0–0.2)

## 2021-04-02 LAB — OSMOLALITY, URINE: Osmolality, Ur: 285 mOsm/kg — ABNORMAL LOW (ref 300–900)

## 2021-04-02 LAB — SODIUM: Sodium: 116 mmol/L — CL (ref 135–145)

## 2021-04-02 LAB — RESP PANEL BY RT-PCR (FLU A&B, COVID) ARPGX2
Influenza A by PCR: NEGATIVE
Influenza B by PCR: NEGATIVE
SARS Coronavirus 2 by RT PCR: NEGATIVE

## 2021-04-02 LAB — SODIUM, URINE, RANDOM: Sodium, Ur: <10 mmol/L

## 2021-04-02 LAB — TSH: TSH: 5.348 u[IU]/mL — ABNORMAL HIGH (ref 0.350–4.500)

## 2021-04-02 LAB — LACTIC ACID, PLASMA: Lactic Acid, Venous: 3.7 mmol/L (ref 0.5–1.9)

## 2021-04-02 LAB — FUNGUS CULTURE RESULT

## 2021-04-02 LAB — OSMOLALITY: Osmolality: 291 mOsm/kg (ref 275–295)

## 2021-04-02 LAB — TROPONIN I (HIGH SENSITIVITY): Troponin I (High Sensitivity): 601 ng/L (ref <18)

## 2021-04-02 LAB — AMMONIA: Ammonia: <9 umol/L — ABNORMAL LOW (ref 9–35)

## 2021-04-02 MED ORDER — APIXABAN 2.5 MG PO TABS
2.5000 mg | ORAL_TABLET | Freq: Two times a day (BID) | ORAL | Status: DC
  Administered 2021-04-03: 2.5 mg via ORAL
  Filled 2021-04-02 (×2): qty 1

## 2021-04-02 MED ORDER — ONDANSETRON HCL 4 MG PO TABS: 4.0000 mg | ORAL_TABLET | Freq: Four times a day (QID) | ORAL | Status: DC | PRN

## 2021-04-02 MED ORDER — SODIUM CHLORIDE 3 % IV SOLN
INTRAVENOUS | Status: DC
  Filled 2021-04-02 (×4): qty 500

## 2021-04-02 MED ORDER — SIMVASTATIN 20 MG PO TABS
20.0000 mg | ORAL_TABLET | Freq: Every morning | ORAL | Status: DC
  Administered 2021-04-03 – 2021-04-08 (×6): 20 mg via ORAL
  Filled 2021-04-02: qty 1
  Filled 2021-04-02: qty 2
  Filled 2021-04-02: qty 1
  Filled 2021-04-02: qty 2
  Filled 2021-04-02 (×2): qty 1

## 2021-04-02 MED ORDER — TIOTROPIUM BROMIDE MONOHYDRATE 18 MCG IN CAPS
18.0000 ug | ORAL_CAPSULE | Freq: Every day | RESPIRATORY_TRACT | Status: DC
  Administered 2021-04-03 – 2021-04-08 (×5): 18 ug via RESPIRATORY_TRACT
  Filled 2021-04-02 (×2): qty 5

## 2021-04-02 MED ORDER — POLYETHYLENE GLYCOL 3350 17 G PO PACK: 17.0000 g | PACK | Freq: Every day | ORAL | Status: DC | PRN

## 2021-04-02 MED ORDER — IPRATROPIUM-ALBUTEROL 20-100 MCG/ACT IN AERS: 2.0000 | INHALATION_SPRAY | Freq: Four times a day (QID) | RESPIRATORY_TRACT | Status: DC | PRN

## 2021-04-02 MED ORDER — SODIUM CHLORIDE 3 % IV SOLN: INTRAVENOUS | Status: DC

## 2021-04-02 MED ORDER — ACETAMINOPHEN 650 MG RE SUPP: 650.0000 mg | Freq: Four times a day (QID) | RECTAL | Status: DC | PRN

## 2021-04-02 MED ORDER — ONDANSETRON HCL 4 MG/2ML IJ SOLN
4.0000 mg | Freq: Four times a day (QID) | INTRAMUSCULAR | Status: DC | PRN
  Administered 2021-04-05: 4 mg via INTRAVENOUS
  Filled 2021-04-02: qty 2

## 2021-04-02 MED ORDER — SODIUM CHLORIDE 0.9 % IV BOLUS: 250.0000 mL | Freq: Once | INTRAVENOUS | Status: DC

## 2021-04-02 MED ORDER — INSULIN GLARGINE 100 UNIT/ML ~~LOC~~ SOLN
10.0000 [IU] | Freq: Every day | SUBCUTANEOUS | Status: DC
  Administered 2021-04-03 – 2021-04-07 (×6): 10 [IU] via SUBCUTANEOUS
  Filled 2021-04-02 (×8): qty 0.1

## 2021-04-02 MED ORDER — PANTOPRAZOLE SODIUM 40 MG PO TBEC
40.0000 mg | DELAYED_RELEASE_TABLET | Freq: Every day | ORAL | Status: DC
  Administered 2021-04-03 – 2021-04-08 (×6): 40 mg via ORAL
  Filled 2021-04-02 (×6): qty 1

## 2021-04-02 MED ORDER — ACETAMINOPHEN 325 MG PO TABS
650.0000 mg | ORAL_TABLET | Freq: Four times a day (QID) | ORAL | Status: DC | PRN
  Administered 2021-04-03 – 2021-04-07 (×6): 650 mg via ORAL
  Filled 2021-04-02 (×7): qty 2

## 2021-04-02 MED ORDER — TRAZODONE HCL 50 MG PO TABS
50.0000 mg | ORAL_TABLET | Freq: Every day | ORAL | Status: DC
  Administered 2021-04-03 – 2021-04-07 (×6): 50 mg via ORAL
  Filled 2021-04-02 (×6): qty 1

## 2021-04-02 NOTE — ED Triage Notes (Addendum)
Pt comes into the ED via EMS from home with c/o increased generalized weakness,  Per EMS pt is not able to stand at all. Pt is a pour historian. Pt has a bandage to the right arm, EMS did not report any injures.  HR 60 3L [place] [id number]

## 2021-04-02 NOTE — ED Provider Notes (Signed)
[facility] Emergency Department Provider Note  ____________________________________________   Event Date/Time   First MD Initiated Contact with Patient 04/02/21 1611     (approximate)  I have reviewed the triage vital signs and the nursing notes.   HISTORY  Chief Complaint Weakness  EM caveat: Fairly poor historian, he is able to be well oriented but at the same time he cannot really provide me much pertinent history  HPI [name] is a 75 y.o. male history of A. fib, CHF COPD coronary disease diabetes hypertension hyperlipidemia stroke  Patient reports that he went to see his doctor today they had to carry him into his appointment these been very weak.  He falls frequently bumps into things regularly.  Feels tired and fatigued  Reports generally just feeling weak and fatigued.  Also reports he is having fair amount of discomfort around his hemorrhoids  No chest pain no shortness of breath no fevers.  Feels very fatigued  ----------------------------------------- 5:56 PM on 04/02/2021 -----------------------------------------  Patient's wife arrives.  Reports that for about 4 to 5 days now he has been very fatigued not eating, just drinking about 416 ounce bottles of water a day and a couple coffee in the morning.  His memory has been poor, he has been seems just a little bit confused for the last 4 days.  Increasing weakness day by day.  Past Medical History:  Diagnosis Date  . Arrhythmia    atrial fibrillation  . Atrial fibrillation ([place])   . Basal cell carcinoma 05/2019   right nasal ala, Tx: EDC  . Basal cell carcinoma 04/12/2019   Left posterior ear. Nodular pattern, excoriated.   . CHF (congestive heart failure) ([place])   . COPD (chronic obstructive pulmonary disease) ([place])   . Coronary artery disease   . Diabetes mellitus without complication ([place])   . GERD (gastroesophageal reflux disease)   . HLD (hyperlipidemia)   . Hypertension   .  Peripheral vascular disease ([place])   . Pneumonia   . Prostate cancer ([place])   . Squamous cell carcinoma of skin 08/10/2018   Left upper arm above elbow. WD SCC.  Marland Kitchen Squamous cell carcinoma of skin 09/07/2018   Right forearm, below elbow. WD SCC. [facility] 01/10/2019.  Marland Kitchen Stroke [facility])     Patient Active Problem List   Diagnosis Date Noted  . Pressure injury of skin 02/20/2021  . AKI (acute kidney injury) ([place]) 02/19/2021  . Type 2 diabetes mellitus with hypoglycemia without coma ([place]) 02/19/2021  . Bradycardia 02/19/2021  . Weakness generalized 02/19/2021  . Dental infection 02/19/2021  . Acute respiratory failure ([place]) 01/28/2021  . Acute renal failure superimposed on stage 3b chronic kidney disease ([place]) 01/28/2021  . Atrial fibrillation, chronic ([place]) 01/28/2021  . Acute CHF (congestive heart failure) ([place]) 11/15/2020  . Chronic diastolic CHF (congestive heart failure) ([place]) 10/24/2020  . Elevated troponin 10/24/2020  . CKD (chronic kidney disease), stage IV ([place]) 10/24/2020  . Malnutrition of moderate degree 12/27/2019  . Hypertensive emergency 12/26/2019  . Hyperglycemia due to type 2 diabetes mellitus ([place]) 12/26/2019  . Chronic kidney disease (CKD) stage G3b/A3, moderately decreased glomerular filtration rate (GFR) between 30-44 mL/min/1.73 square meter and albuminuria creatinine ratio greater than 300 mg/g (HCC) 12/26/2019  . History of COVID-19 12/26/2019  . History of CVA (cerebrovascular accident) 12/26/2019  . 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 History of second degree heart block 12/26/2019  . Elevated LFTs 12/26/2019  . Acute systolic CHF (congestive heart  failure) ([place]) 10/11/2019  . Malignant neoplasm of prostate ([place]) 11/24/2016  . Bright red rectal bleeding 10/02/2016  . COPD with acute exacerbation ([place]) 01/17/2016  . Chronic systolic heart failure ([place]) 12/12/2015  . Essential hypertension 12/12/2015  . COPD (chronic obstructive pulmonary disease) with chronic bronchitis ([place])  12/12/2015  . Diabetes ([place]) 12/12/2015  . Tobacco abuse 12/12/2015  . Acute respiratory failure with hypoxia ([place]) 12/03/2015    Past Surgical History:  Procedure Laterality Date  . COLONOSCOPY    . CORONARY ARTERY BYPASS GRAFT  2006  . EYE SURGERY    . GOLD SEED IMPLANT N/A 12/30/2016   Procedure: GOLD SEED IMPLANT  x3;  Surgeon: Hollice Espy, MD;  Location: [facility] ORS;  Service: Urology;  Laterality: N/A;  . HEMORRHOID SURGERY N/A 10/02/2016   Procedure: PROCTOSCOPY AND CONTROL OF RECTAL BLEEDING;  Surgeon: Excell Seltzer, MD;  Location: [facility] ORS;  Service: General;  Laterality: N/A;  . PROSTATE BIOPSY    . TONSILLECTOMY      Prior to Admission medications   Medication Sig Start Date End Date Taking? Authorizing Provider  acetaminophen (TYLENOL) 325 MG tablet Take 2 tablets (650 mg total) by mouth every 6 (six) hours as needed for mild pain (or Fever >/= 101). 01/20/16   Gouru, Illene Silver, MD  albuterol (PROVENTIL HFA;VENTOLIN HFA) 108 (90 Base) MCG/ACT inhaler Inhale 2 puffs into the lungs every 6 (six) hours as needed for wheezing or shortness of breath.    [provider]  apixaban (ELIQUIS) 2.5 MG TABS tablet Take 1 tablet (2.5 mg total) by mouth 2 (two) times daily. 03/02/21   Wouk, Ailene Rud, MD  bifidobacterium infantis (ALIGN) capsule Take 1 capsule by mouth every morning.    [provider]  bisacodyl (DULCOLAX) 10 MG suppository Place 10 mg rectally daily as needed for moderate constipation.    [provider]  cholecalciferol (VITAMIN D) 1000 units tablet Take 1,000 Units by mouth daily.    [provider]  Cinnamon 500 MG capsule Take 1,000 mg by mouth 2 (two) times daily.    [provider]  COMBIVENT RESPIMAT 20-100 MCG/ACT AERS respimat Inhale 2 puffs into the lungs 4 (four) times daily as needed. 09/18/20   [provider]  CRANBERRY EXTRACT PO Take 500 mg by mouth every morning.     [provider]  Ensure  Max Protein (ENSURE MAX PROTEIN) LIQD Take 330 mLs (11 oz total) by mouth 2 (two) times daily between meals. Patient not taking: Reported on 03/25/2021 12/29/19   Nicole Kindred A, DO  feeding supplement, GLUCERNA SHAKE, (GLUCERNA SHAKE) LIQD Take 237 mLs by mouth 3 (three) times daily between meals.    [provider]  gabapentin (NEURONTIN) 100 MG capsule Take 100 mg by mouth 2 (two) times daily.    [provider]  glucose 4 GM chewable tablet Chew 1 tablet by mouth as needed for low blood sugar.    [provider]  hydrocortisone (ANUSOL-HC) 2.5 % rectal cream Place 1 application rectally 2 (two) times daily.    [provider]  insulin aspart (NOVOLOG) 100 UNIT/ML injection 0-9 Units, Subcutaneous, 3 times daily with meals CBG < 70: Implement Hypoglycemia protocol/measures CBG 70 - 120: 0 units CBG 121 - 150: 1 unit CBG 151 - 200: 2 units CBG 201 - 250: 3 units CBG 251 - 300: 5 units CBG 301 - 350: 7 units CBG 351 - 400: 9 units CBG > 400: call MD 10/15/19  Ghimire, Henreitta Leber, MD  iron polysaccharides (NIFEREX) 150 MG capsule Take 1 capsule (150 mg total) by mouth daily. Can take any over-the-counter iron supplement. 02/06/21   Enzo Bi, MD  lactase (LACTAID) 3000 units tablet Take 3,000 Units by mouth 3 (three) times daily between meals as needed.    [provider]  LANTUS SOLOSTAR 100 UNIT/ML Solostar Pen Inject 15 Units into the skin daily at 10 pm. Patient taking differently: Inject 20 Units into the skin daily at 10 pm. 03/02/21   Wouk, Ailene Rud, MD  magnesium oxide (MAG-OX) 400 MG tablet Take 400 mg by mouth daily.    [provider]  metolazone (ZAROXOLYN) 2.5 MG tablet Take 1 tablet (2.5 mg total) by mouth daily. 03/03/21   Wouk, Ailene Rud, MD  Multiple Vitamin (MULTIVITAMIN WITH MINERALS) TABS tablet Take 1 tablet by mouth daily.    [provider]  multivitamin-lutein (OCUVITE-LUTEIN) CAPS capsule Take 1 capsule  by mouth daily. Patient not taking: Reported on 03/25/2021    [provider]  nicotine (NICODERM CQ - DOSED IN MG/24 HOURS) 21 mg/24hr patch Place 21 mg onto the skin daily.    [provider]  omeprazole (PRILOSEC) 20 MG capsule Take 20 mg by mouth daily.    [provider]  simvastatin (ZOCOR) 20 MG tablet Take 20 mg by mouth in the morning.    [provider]  SUPER B COMPLEX/C PO Take 1 tablet by mouth every morning.    [provider]  tiotropium (SPIRIVA HANDIHALER) 18 MCG inhalation capsule Place 1 capsule (18 mcg total) into inhaler and inhale daily. 02/05/21 05/06/21  Enzo Bi, MD  torsemide (DEMADEX) 20 MG tablet Take 40 mg by mouth daily.    [provider]  traZODone (DESYREL) 50 MG tablet Take 50 mg by mouth at bedtime.    [provider]  vitamin C (ASCORBIC ACID) 500 MG tablet Take 500 mg by mouth 2 (two) times daily.     [provider]    Allergies Lactose intolerance (gi)  Family History  Problem Relation Age of Onset  . Lung cancer Mother   . Heart attack Father   . Hypertension Other   . Cancer Maternal Grandfather        prostate  . Cancer Paternal Grandmother        breast    Social History Social History   Tobacco Use  . Smoking status: Former Smoker    Packs/day: 1.00    Years: 54.00    Pack years: 54.00    Types: Cigarettes  . Smokeless tobacco: Never Used  Vaping Use  . Vaping Use: Never used  Substance Use Topics  . Alcohol use: No    Alcohol/week: 0.0 standard drinks  . Drug use: No    Review of Systems Constitutional: No fever/chills but reports has been feeling very tired Eyes: No visual changes. ENT: No sore throat.  Feels thirsty Cardiovascular: Denies chest pain. Respiratory: Denies shortness of breath. Gastrointestinal: No abdominal pain.  Pain around his hemorrhoids Genitourinary: Negative for dysuria.  Not urinating too much Musculoskeletal: Negative for back  pain. Skin: Negative for rash. Neurological: Negative for headaches, areas of focal weakness or numbness.    ____________________________________________   PHYSICAL EXAM:  VITAL SIGNS: ED Triage Vitals  Enc Vitals Group     BP 04/02/21 1537 96/64     Pulse Rate 04/02/21 1537 62     Resp 04/02/21 1537 18  Temp 04/02/21 1537 98.4 F (36.9 C)     Temp Source 04/02/21 1537 Oral     SpO2 04/02/21 1537 94 %     Weight 04/02/21 1538 145 lb (65.8 kg)     Height 04/02/21 1538 5\' 6"  (1.676 m)     Head Circumference --      Peak Flow --      Pain Score 04/02/21 1538 9     Pain Loc --      Pain Edu? --      Excl. in [place]? --     Constitutional: Alert and oriented.  Chronically ill-appearing, rather cachectic in appearance. Eyes: Conjunctivae are normal. Head: Atraumatic. Nose: No congestion/rhinnorhea. Mouth/Throat: Mucous membranes are moist. Neck: No stridor.  Cardiovascular: Normal rate, irregular rhythm. Grossly normal heart sounds.  Good peripheral circulation. Respiratory: Normal respiratory effort.  No retractions. Lungs CTAB. Gastrointestinal: Soft and nontender. No distention. Musculoskeletal: No lower extremity tenderness nor edema.  Ranges all extremities well.  5/5 strength in all extremities or maybe 4+ strength in all extremities.  There is no focal deficits noted Neurologic:  Normal speech and language. No gross focal neurologic deficits are appreciated.  Skin:  Skin is warm, dry and intact. No rash noted.  Patient has multiple small skin tears located in various areas shins elbows.  Reports been bumping into a lot of things. Psychiatric: Mood and affect are normal. Speech and behavior are normal.  ____________________________________________   LABS (all labs ordered are listed, but only abnormal results are displayed)  Labs Reviewed  BASIC METABOLIC PANEL - Abnormal; Notable for the following components:      Result Value   Sodium 115 (*)    Chloride 73 (*)     CO2 20 (*)    Glucose, Bld 192 (*)    BUN 114 (*)    Creatinine, Ser 5.48 (*)    Calcium 8.5 (*)    GFR, Estimated 10 (*)    Anion gap 22 (*)    All other components within normal limits  CBC - Abnormal; Notable for the following components:   WBC 17.5 (*)    RBC 3.14 (*)    Hemoglobin 7.3 (*)    HCT 23.1 (*)    MCV 73.6 (*)    MCH 23.2 (*)    RDW 17.5 (*)    All other components within normal limits  URINALYSIS, COMPLETE (UACMP) WITH MICROSCOPIC - Abnormal; Notable for the following components:   Color, Urine AMBER (*)    APPearance HAZY (*)    Bacteria, UA RARE (*)    All other components within normal limits  PROTIME-INR - Abnormal; Notable for the following components:   Prothrombin Time 16.2 (*)    INR 1.3 (*)    All other components within normal limits  AMMONIA - Abnormal; Notable for the following components:   Ammonia <9 (*)    All other components within normal limits  LACTIC ACID, PLASMA - Abnormal; Notable for the following components:   Lactic Acid, Venous 3.7 (*)    All other components within normal limits  TROPONIN I (HIGH SENSITIVITY) - Abnormal; Notable for the following components:   Troponin I (High Sensitivity) 601 (*)    All other components within normal limits  RESP PANEL BY RT-PCR (FLU A&B, COVID) ARPGX2  CULTURE, BLOOD (SINGLE)  HEPATIC FUNCTION PANEL  TSH  SODIUM  SODIUM  SODIUM  OSMOLALITY  OSMOLALITY, URINE  SODIUM, URINE, RANDOM  CBG MONITORING, ED  ____________________________________________  EKG  Is reviewed inter by me at 1545 Heart rate 60 QRS 130 QTc 480 None A. fib, normal rate, T wave inversions notable in inferior and lateral distribution.  Of note the patient is not complaining of any chest pain.  EKG changes are new compared to previous though * ____________________________________________  RADIOLOGY  DG Chest 1 View  Result Date: 04/02/2021 CLINICAL DATA:  Confusion, weakness and elevated white blood cell  count. EXAM: CHEST  1 VIEW COMPARISON:  Single-view of the chest 02/21/2021 and 03/01/2021. CT chest 02/02/2021. FINDINGS: The lungs are emphysematous. There is left basilar atelectasis. The right lung is clear. Cardiomegaly. The patient is status post CABG. Aortic atherosclerosis. No pneumothorax or pleural fluid. IMPRESSION: No acute disease. Left basilar atelectasis. Cardiomegaly. Aortic Atherosclerosis (ICD10-I70.0) and Emphysema (ICD10-J43.9). Electronically Signed   By: Inge Rise M.D.   On: 04/02/2021 17:04   CT Head Wo Contrast  Result Date: 04/02/2021 CLINICAL DATA:  Generalized weakness and altered mental status EXAM: CT HEAD WITHOUT CONTRAST TECHNIQUE: Contiguous axial images were obtained from the base of the skull through the vertex without intravenous contrast. COMPARISON:  02/19/2021 FINDINGS: Brain: No evidence of acute infarction, hemorrhage, hydrocephalus, extra-axial collection or mass lesion/mass effect. Mild atrophic and chronic white matter ischemic changes are noted. Vascular: No hyperdense vessel or unexpected calcification. Skull: Normal. Negative for fracture or focal lesion. Sinuses/Orbits: No acute finding. Other: None. IMPRESSION: Chronic atrophic and ischemic changes without acute abnormality. Electronically Signed   By: Inez Catalina M.D.   On: 04/02/2021 16:46    CT head reviewed negative for acute finding.  Chest x-ray reviewed, negative for acute disease. ____________________________________________   PROCEDURES  Procedure(s) performed: None  Procedures  Critical Care performed: Yes, see critical care note(s)  CRITICAL CARE Performed by: Delman Kitten   Total critical care time: 35 minutes  Critical care time was exclusive of separately billable procedures and treating other patients.  Critical care was necessary to treat or prevent imminent or life-threatening deterioration.  Critical care was time spent personally by me on the following activities:  development of treatment plan with patient and/or surrogate as well as nursing, discussions with consultants, evaluation of patient's response to treatment, examination of patient, obtaining history from patient or surrogate, ordering and performing treatments and interventions, ordering and review of laboratory studies, ordering and review of radiographic studies, pulse oximetry and re-evaluation of patient's condition.  Patient with critically low sodium, hemodynamically stable.  He reports some significant fatigue but is not found to be acutely confused with evidence of neurologic deficit or somnolence by my exam at this time.  I suspect this hyponatremia is likely not extremely sudden or acute in onset.  At this point await recommendations from nephrology.  I discussed with hospitalist, patient will be admitted to his service under Dr. Marlyce Huge ____________________________________________   INITIAL IMPRESSION / [place] / ED COURSE  Pertinent labs & imaging results that were available during my care of the patient were reviewed by me and considered in my medical decision making (see chart for details).   Patient presents for weakness, reports unable to get up feeling very fatigued.  He reports that he has had fatigue for several days and since leaving a rehab facility.  He is alert and oriented, participates in exam, demonstrates some generalized weakness but no acute neurodeficits.  He does not appear to have any acute encephalopathy at this time.  Clinical Course as of 04/02/21 1839  Tue Apr 02, 2021  1730 Discused with Dr. Abigail Butts, he will review work-up/labs, etc shortly and call back with recommendations  [MQ] [MQ]  1740 Neprhology has reviewed and placing orders for Tolvaptan [MQ]  1759 Note patient resting comfortably.  Alert follows commands.  Critical labs include elevated troponin, EKG changes are present but no associated chest pain.  At this point I suspect this may be  demand ischemia especially in the setting of not having any associated chest pain.  He has notable metabolic derangements.  Leukocytosis.  Thus far no evidence of acute infectious cause, but await urine [MQ]    Clinical Course User Index [MQ] Delman Kitten, MD   Patient does appear somewhat dehydrated on examination dry mucous membranes, labs demonstrated BUN of 114 and creatinine of 5.4.  Of notable concern as well as metabolic panel with a sodium of 115.  He does have a leukocytosis and anemia, the anemia is not new.  Will obtain a infectious work-up though no obvious infectious source clinical assessment and history at this time.  Nephrology has been consulted  ----------------------------------------- 5:56 PM on 04/02/2021 -----------------------------------------  Nephrologist called advises not going to use tolvaptan and.  Reviewing further, considering 3% sodium chloride.  ----------------------------------------- 6:40 PM on 04/02/2021 -----------------------------------------   ____________________________________________   FINAL CLINICAL IMPRESSION(S) / ED DIAGNOSES  Final diagnoses:  Weakness  AKI (acute kidney injury) ([place])  Hyponatremia  Troponin level elevated        Note:  This document was prepared using Dragon voice recognition software and may include unintentional dictation errors       Delman Kitten, MD 04/02/21 2022

## 2021-04-02 NOTE — H&P (Addendum)
History and Physical    [name] RJJ:[id number] DOB: [birth year] DOA: 04/02/2021  PCP: [name], MD  Patient coming from: Home   Chief Complaint:  Chief Complaint  Patient presents with  . Weakness     HPI:    75 year old male with past medical history of chronic kidney disease stage IIIb, diastolic congestive heart failure (Echo 11/2020 EF 50-55%), diabetes mellitus type 2, coronary artery disease status post CABG presented to [facility] emergency department with complaints of weakness.  Patient is an extremely poor historian and majority of history is been obtained from the wife as well as discussion with the emergency department staff.  Of note, patient was recently hospitalized at [facility] from 4/19 until 4/30.  Patient was admitted for acute kidney injury with ATN with volume overload.  Patient required a furosemide infusion for portion of the hospitalization and was eventually transitioned over to a combination of torsemide and metolazone at time of discharge.  Patient was eventually discharged on 4/30.  Patient has been compliant with all medications since discharge.  Patient has been exhibiting progressively worsening weakness for approximately the past 5 days.  As patient's generalized weakness has worsened, patient has exhibited associated poor appetite.  Patient is now exhibiting progressively worsening forgetfulness and confusion.  There have been no symptoms of fevers, nausea, vomiting, diarrhea, recent travel, sick contacts.  Patient eventually presented to [facility] emergency department for evaluation.  On evaluation patient was found to have a sodium of 115 with acute kidney injury and creatinine of 5.45.  ER provider discussed patient with Dr. Juleen China with nephrology who recommended initiation of hypertonic saline and admission to the stepdown unit.  Hospitalist group was then called to assess the patient for mission to the hospital.  Review of Systems:   Review of Systems   Constitutional: Positive for malaise/fatigue.  Neurological: Positive for weakness.  All other systems reviewed and are negative.   Past Medical History:  Diagnosis Date  . Arrhythmia    atrial fibrillation  . Atrial fibrillation ([place])   . Basal cell carcinoma 05/2019   right nasal ala, Tx: EDC  . Basal cell carcinoma 04/12/2019   Left posterior ear. Nodular pattern, excoriated.   . CHF (congestive heart failure) ([place])   . COPD (chronic obstructive pulmonary disease) ([place])   . Coronary artery disease   . Diabetes mellitus without complication ([place])   . GERD (gastroesophageal reflux disease)   . HLD (hyperlipidemia)   . Hypertension   . Peripheral vascular disease ([place])   . Pneumonia   . Prostate cancer ([place])   . Squamous cell carcinoma of skin 08/10/2018   Left upper arm above elbow. [facility] SCC.  Marland Kitchen Squamous cell carcinoma of skin 09/07/2018   Right forearm, below elbow. WD SCC. [facility] 01/10/2019.  Marland Kitchen Stroke [facility])     Past Surgical History:  Procedure Laterality Date  . COLONOSCOPY    . CORONARY ARTERY BYPASS GRAFT  2006  . EYE SURGERY    . GOLD SEED IMPLANT N/A 12/30/2016   Procedure: GOLD SEED IMPLANT  x3;  Surgeon: Hollice Espy, MD;  Location: [facility] ORS;  Service: Urology;  Laterality: N/A;  . HEMORRHOID SURGERY N/A 10/02/2016   Procedure: PROCTOSCOPY AND CONTROL OF RECTAL BLEEDING;  Surgeon: Excell Seltzer, MD;  Location: [facility] ORS;  Service: General;  Laterality: N/A;  . PROSTATE BIOPSY    . TONSILLECTOMY       reports that he has quit smoking. His smoking use included cigarettes. He has a 54.00  pack-year smoking history. He has never used smokeless tobacco. He reports that he does not drink alcohol and does not use drugs.  Allergies  Allergen Reactions  . Lactose Intolerance (Gi)     Family History  Problem Relation Age of Onset  . Lung cancer Mother   . Heart attack Father   . Hypertension Other   . Cancer Maternal Grandfather        prostate  . Cancer Paternal  Grandmother        breast     Prior to Admission medications   Medication Sig Start Date End Date Taking? Authorizing Provider  acetaminophen (TYLENOL) 325 MG tablet Take 2 tablets (650 mg total) by mouth every 6 (six) hours as needed for mild pain (or Fever >/= 101). 01/20/16   Gouru, Illene Silver, MD  albuterol (PROVENTIL HFA;VENTOLIN HFA) 108 (90 Base) MCG/ACT inhaler Inhale 2 puffs into the lungs every 6 (six) hours as needed for wheezing or shortness of breath.    [provider]  apixaban (ELIQUIS) 2.5 MG TABS tablet Take 1 tablet (2.5 mg total) by mouth 2 (two) times daily. 03/02/21   Wouk, Ailene Rud, MD  bifidobacterium infantis (ALIGN) capsule Take 1 capsule by mouth every morning.    [provider]  bisacodyl (DULCOLAX) 10 MG suppository Place 10 mg rectally daily as needed for moderate constipation.    [provider]  cholecalciferol (VITAMIN D) 1000 units tablet Take 1,000 Units by mouth daily.    [provider]  Cinnamon 500 MG capsule Take 1,000 mg by mouth 2 (two) times daily.    [provider]  COMBIVENT RESPIMAT 20-100 MCG/ACT AERS respimat Inhale 2 puffs into the lungs 4 (four) times daily as needed. 09/18/20   [provider]  CRANBERRY EXTRACT PO Take 500 mg by mouth every morning.     [provider]  Ensure Max Protein (ENSURE MAX PROTEIN) LIQD Take 330 mLs (11 oz total) by mouth 2 (two) times daily between meals. Patient not taking: Reported on 03/25/2021 12/29/19   Nicole Kindred A, DO  feeding supplement, GLUCERNA SHAKE, (GLUCERNA SHAKE) LIQD Take 237 mLs by mouth 3 (three) times daily between meals.    [provider]  gabapentin (NEURONTIN) 100 MG capsule Take 100 mg by mouth 2 (two) times daily.    [provider]  glucose 4 GM chewable tablet Chew 1 tablet by mouth as needed for low blood sugar.    [provider]  hydrocortisone (ANUSOL-HC) 2.5 % rectal cream Place 1 application  rectally 2 (two) times daily.    [provider]  insulin aspart (NOVOLOG) 100 UNIT/ML injection 0-9 Units, Subcutaneous, 3 times daily with meals CBG < 70: Implement Hypoglycemia protocol/measures CBG 70 - 120: 0 units CBG 121 - 150: 1 unit CBG 151 - 200: 2 units CBG 201 - 250: 3 units CBG 251 - 300: 5 units CBG 301 - 350: 7 units CBG 351 - 400: 9 units CBG > 400: call MD 10/15/19   Jonetta Osgood, MD  iron polysaccharides (NIFEREX) 150 MG capsule Take 1 capsule (150 mg total) by mouth daily. Can take any over-the-counter iron supplement. 02/06/21   Enzo Bi, MD  lactase (LACTAID) 3000 units tablet Take 3,000 Units by mouth 3 (three) times daily between meals as needed.    [provider]  LANTUS SOLOSTAR 100 UNIT/ML Solostar Pen Inject 15 Units into the skin daily at 10 pm. Patient taking differently: Inject 20 Units into  the skin daily at 10 pm. 03/02/21   Wouk, Ailene Rud, MD  magnesium oxide (MAG-OX) 400 MG tablet Take 400 mg by mouth daily.    [provider]  metolazone (ZAROXOLYN) 2.5 MG tablet Take 1 tablet (2.5 mg total) by mouth daily. 03/03/21   Wouk, Ailene Rud, MD  Multiple Vitamin (MULTIVITAMIN WITH MINERALS) TABS tablet Take 1 tablet by mouth daily.    [provider]  multivitamin-lutein (OCUVITE-LUTEIN) CAPS capsule Take 1 capsule by mouth daily. Patient not taking: Reported on 03/25/2021    [provider]  nicotine (NICODERM CQ - DOSED IN MG/24 HOURS) 21 mg/24hr patch Place 21 mg onto the skin daily.    [provider]  omeprazole (PRILOSEC) 20 MG capsule Take 20 mg by mouth daily.    [provider]  simvastatin (ZOCOR) 20 MG tablet Take 20 mg by mouth in the morning.    [provider]  SUPER B COMPLEX/C PO Take 1 tablet by mouth every morning.    [provider]  tiotropium (SPIRIVA HANDIHALER) 18 MCG inhalation capsule Place 1 capsule (18 mcg total) into inhaler and inhale daily. 02/05/21  05/06/21  Enzo Bi, MD  torsemide (DEMADEX) 20 MG tablet Take 40 mg by mouth daily.    [provider]  traZODone (DESYREL) 50 MG tablet Take 50 mg by mouth at bedtime.    [provider]  vitamin C (ASCORBIC ACID) 500 MG tablet Take 500 mg by mouth 2 (two) times daily.     [provider]    Physical Exam: Vitals:   04/02/21 1812 04/02/21 1900 04/02/21 1930 04/02/21 2005  BP: 118/68 (!) 95/34 109/63 (!) 112/42  Pulse: (!) 52 (!) 47 (!) 46 (!) 50  Resp: 15 18 18 15   Temp:      TempSrc:      SpO2: 100% 100% 100% 100%  Weight:      Height:        Constitutional: Lethargic arousable, oriented x3, no associated distress.   Skin: Notable skin tears on the right upper extremity and bilateral lower extremities.  Extremely poor skin turgor noted. Eyes: Pupils are equally reactive to light.  No evidence of scleral icterus or conjunctival pallor.  ENMT: Dry mucous membranes noted.  Posterior pharynx clear of any exudate or lesions.   Neck: normal, supple, no masses, no thyromegaly.  No evidence of jugular venous distension.   Respiratory: clear to auscultation bilaterally, no wheezing, no crackles. Normal respiratory effort. No accessory muscle use.  Cardiovascular: Bradycardic and irregularly irregular, No murmurs / rubs / gallops. No extremity edema. 2+ pedal pulses. No carotid bruits.  Chest:   Nontender without crepitus or deformity.   Back:   Nontender without crepitus or deformity. Abdomen: Abdomen is soft and nontender.  No evidence of intra-abdominal masses.  Positive bowel sounds noted in all quadrants.   Musculoskeletal: No joint deformity upper and lower extremities. Good ROM, no contractures. Normal muscle tone.  Neurologic: CN 2-12 grossly intact. Sensation intact.  Patient moving all 4 extremities spontaneously.  Patient is following all commands.  Patient is responsive to verbal stimuli.   Psychiatric: Patient exhibits normal mood with flat affect.   Patient currently does not seem to possess insight as to his current situation.   Labs on Admission: I have personally reviewed following labs and imaging studies -   CBC: Recent Labs  Lab 04/02/21 1550  WBC 17.5*  HGB 7.3*  HCT 23.1*  MCV 73.6*  PLT  254   Basic Metabolic Panel: Recent Labs  Lab 04/02/21 1550 04/02/21 1832  NA 115* 116*  K 5.0  --   CL 73*  --   CO2 20*  --   GLUCOSE 192*  --   BUN 114*  --   CREATININE 5.48*  --   CALCIUM 8.5*  --    GFR: Estimated Creatinine Clearance: 10.5 mL/min (A) (by C-G formula based on SCr of 5.48 mg/dL (H)). Liver Function Tests: No results for input(s): AST, ALT, ALKPHOS, BILITOT, PROT, ALBUMIN in the last 168 hours. No results for input(s): LIPASE, AMYLASE in the last 168 hours. Recent Labs  Lab 04/02/21 1639  AMMONIA <9*   Coagulation Profile: Recent Labs  Lab 04/02/21 1639  INR 1.3*   Cardiac Enzymes: No results for input(s): CKTOTAL, CKMB, CKMBINDEX, TROPONINI in the last 168 hours. BNP (last 3 results) No results for input(s): PROBNP in the last 8760 hours. HbA1C: No results for input(s): HGBA1C in the last 72 hours. CBG: No results for input(s): GLUCAP in the last 168 hours. Lipid Profile: No results for input(s): CHOL, HDL, LDLCALC, TRIG, CHOLHDL, LDLDIRECT in the last 72 hours. Thyroid Function Tests: No results for input(s): TSH, T4TOTAL, FREET4, T3FREE, THYROIDAB in the last 72 hours. Anemia Panel: No results for input(s): VITAMINB12, FOLATE, FERRITIN, TIBC, IRON, RETICCTPCT in the last 72 hours. Urine analysis:    Component Value Date/Time   COLORURINE AMBER (A) 04/02/2021 1639   APPEARANCEUR HAZY (A) 04/02/2021 1639   APPEARANCEUR Clear 04/28/2014 2346   LABSPEC 1.015 04/02/2021 1639   LABSPEC 1.009 04/28/2014 2346   PHURINE 5.0 04/02/2021 1639   GLUCOSEU NEGATIVE 04/02/2021 1639   GLUCOSEU Negative 04/28/2014 2346   HGBUR NEGATIVE 04/02/2021 1639   BILIRUBINUR NEGATIVE 04/02/2021 1639    BILIRUBINUR Negative 04/28/2014 [place] 04/02/2021 1639   PROTEINUR NEGATIVE 04/02/2021 1639   NITRITE NEGATIVE 04/02/2021 [place] 04/02/2021 1639   LEUKOCYTESUR 1+ 04/28/2014 2346    Radiological Exams on Admission - Personally Reviewed: DG Chest 1 View  Result Date: 04/02/2021 CLINICAL DATA:  Confusion, weakness and elevated white blood cell count. EXAM: CHEST  1 VIEW COMPARISON:  Single-view of the chest 02/21/2021 and 03/01/2021. CT chest 02/02/2021. FINDINGS: The lungs are emphysematous. There is left basilar atelectasis. The right lung is clear. Cardiomegaly. The patient is status post CABG. Aortic atherosclerosis. No pneumothorax or pleural fluid. IMPRESSION: No acute disease. Left basilar atelectasis. Cardiomegaly. Aortic Atherosclerosis (ICD10-I70.0) and Emphysema (ICD10-J43.9). Electronically Signed   By: Inge Rise M.D.   On: 04/02/2021 17:04   CT Head Wo Contrast  Result Date: 04/02/2021 CLINICAL DATA:  Generalized weakness and altered mental status EXAM: CT HEAD WITHOUT CONTRAST TECHNIQUE: Contiguous axial images were obtained from the base of the skull through the vertex without intravenous contrast. COMPARISON:  02/19/2021 FINDINGS: Brain: No evidence of acute infarction, hemorrhage, hydrocephalus, extra-axial collection or mass lesion/mass effect. Mild atrophic and chronic white matter ischemic changes are noted. Vascular: No hyperdense vessel or unexpected calcification. Skull: Normal. Negative for fracture or focal lesion. Sinuses/Orbits: No acute finding. Other: None. IMPRESSION: Chronic atrophic and ischemic changes without acute abnormality. Electronically Signed   By: Inez Catalina M.D.   On: 04/02/2021 16:46    EKG: Personally reviewed.  Rhythm is atrial fibrillation with heart rate of 57 bpm.  No dynamic ST segment changes appreciated.  Assessment/Plan Active Problems:   Acute renal failure superimposed on stage 3b chronic  kidney disease ([place])  Patient presenting with severe acute kidney injury with known history of chronic kidney disease stage IIIb  Etiology is likely related to new regimen of home-going combined torsemide and metolazone after patient was hospitalized from 4/19 until 4/30.  Patient is currently clinically volume depleted with associated severe hyponatremia  Hydrating patient gently with intravenous hypertonic saline at the direction of nephrology  Strict input and output monitoring  Minimizing nephrotoxic agents  Monitoring renal function and electrolytes with serial chemistries    Hyponatremia   Severe hyponatremia with associated neurologic symptoms in the form of generalized weakness and lethargy  Hypertonic saline is warranted and has been initiated at the direction of Dr. Juleen China with Nephrology  Patient has been admitted to the stepdown unit to facilitate administration of hypertonic saline  Performing frequent serial chemistries to ensure slow measured sodium correction with a target of approximately 8 in 24 hours and minimize risk of development of central pontine moniliasis.  Obtaining urine sodium, urine osmolality and serum osmolality  Temporarily holding diuretic regimen of torsemide and metolazone  Elevated Troponin   Notable elevation of high-sensitivity troponin of 600 and this patient presenting with substantial acute kidney injury  Patient is chest pain-free  Considering patient's lack of symptoms elevated troponin is thought to be secondary to renal injury and not secondary to plaque rupture.  Will monitor patient on telemetry while in the stepdown unit     Chronic systolic heart failure ([place])   Patient is currently volume depleted, no evidence of cardiogenic volume overload    COPD (chronic obstructive pulmonary disease) with chronic bronchitis (HCC)   Apparent bronchodilator therapy for shortness of breath and wheezing  No evidence of COPD  exacerbation at this time    Atrial fibrillation, chronic ([place])   Continuing home regimen of Eliquis  Patient is in slow atrial fibrillation  Continue to monitor via telemetry  Gastroesophageal reflux disease   Continue home regimen of daily PPI  Diabetes mellitus type 2 with chronic kidney disease stage IIIb with long-term use of insulin   Placing patient on reduced regimen of basal insulin therapy  Checks before every meal and nightly with sliding scale insulin      Code Status:  Full code Family Communication: wife has been updated on plan of care   Status is: Inpatient  Remains inpatient appropriate because:Ongoing diagnostic testing needed not appropriate for outpatient work up, IV treatments appropriate due to intensity of illness or inability to take PO and Inpatient level of care appropriate due to severity of illness   Dispo: The patient is from: Home              Anticipated d/c is to: Home              Patient currently is not medically stable to d/c.   Difficult to place patient No        Vernelle Emerald MD [facility] Pager [phone]  If 7PM-7AM, please contact night-coverage [email or web address] Use universal [place] password for that web site. If you do not have the password, please call the hospital operator.  04/02/2021, 8:49 PM

## 2021-04-02 NOTE — ED Notes (Addendum)
Call from lab, sodium level is critical at 116. Will notify MD.

## 2021-04-02 NOTE — Progress Notes (Signed)
MEDICATION RELATED CONSULT NOTE - INITIAL   Pharmacy Consult for Hypertonic (3%) Saline Monitoring  Indication: Hyponatremia   Vital Signs:  Labs: Sodium  Date Value Ref Range Status  04/02/2021 116 (LL) 135 - 145 mmol/L Final    Comment:    CRITICAL RESULT CALLED TO, READ BACK BY AND VERIFIED WITH SARAH SCHIFFELBEIN AT 2030 ON 04/02/21 BY SS Performed at [facility], [address] [place], [place] [address]   04/30/2014 136 136 - 145 mmol/L Final   Microbiology: Recent Results (from the past 720 hour(s))  Resp Panel by RT-PCR (Flu A&B, Covid) Nasopharyngeal Swab     Status: None   Collection Time: 04/02/21  4:39 PM   Specimen: Nasopharyngeal Swab; Nasopharyngeal(NP) swabs in vial transport medium  Result Value Ref Range Status   SARS Coronavirus 2 by RT PCR NEGATIVE NEGATIVE Final    Comment: (NOTE) SARS-CoV-2 target nucleic acids are NOT DETECTED.  The SARS-CoV-2 RNA is generally detectable in upper respiratory specimens during the acute phase of infection. The lowest concentration of SARS-CoV-2 viral copies this assay can detect is 138 copies/mL. A negative result does not preclude SARS-Cov-2 infection and should not be used as the sole basis for treatment or other patient management decisions. A negative result may occur with  improper specimen collection/handling, submission of specimen other than nasopharyngeal swab, presence of viral mutation(s) within the areas targeted by this assay, and inadequate number of viral copies(<138 copies/mL). A negative result must be combined with clinical observations, patient history, and epidemiological information. The expected result is Negative.  Fact Sheet for Patients:  EntrepreneurPulse.com.au  Fact Sheet for Healthcare Providers:  IncredibleEmployment.be  This test is no t yet approved or cleared by the [place] FDA and  has been authorized for detection and/or  diagnosis of SARS-CoV-2 by FDA under an Emergency Use Authorization (EUA). This EUA will remain  in effect (meaning this test can be used) for the duration of the COVID-19 declaration under Section 564(b)(1) of the Act, 21 U.S.C.section 360bbb-3(b)(1), unless the authorization is terminated  or revoked sooner.       Influenza A by PCR NEGATIVE NEGATIVE Final   Influenza B by PCR NEGATIVE NEGATIVE Final    Comment: (NOTE) The Xpert Xpress SARS-CoV-2/FLU/RSV plus assay is intended as an aid in the diagnosis of influenza from Nasopharyngeal swab specimens and should not be used as a sole basis for treatment. Nasal washings and aspirates are unacceptable for Xpert Xpress SARS-CoV-2/FLU/RSV testing.  Fact Sheet for Patients: EntrepreneurPulse.com.au  Fact Sheet for Healthcare Providers: IncredibleEmployment.be  This test is not yet approved or cleared by the [place] FDA and has been authorized for detection and/or diagnosis of SARS-CoV-2 by FDA under an Emergency Use Authorization (EUA). This EUA will remain in effect (meaning this test can be used) for the duration of the COVID-19 declaration under Section 564(b)(1) of the Act, 21 U.S.C. section 360bbb-3(b)(1), unless the authorization is terminated or revoked.  Performed at [facility], [address], [place], [place] [address]     Medical History: Past Medical History:  Diagnosis Date  . Arrhythmia    atrial fibrillation  . Atrial fibrillation ([place])   . Basal cell carcinoma 05/2019   right nasal ala, Tx: EDC  . Basal cell carcinoma 04/12/2019   Left posterior ear. Nodular pattern, excoriated.   . CHF (congestive heart failure) ([place])   . COPD (chronic obstructive pulmonary disease) ([place])   . Coronary artery disease   . Diabetes  mellitus without complication ([place])   . GERD (gastroesophageal reflux disease)   . HLD (hyperlipidemia)   . Hypertension   . Peripheral  vascular disease ([place])   . Pneumonia   . Prostate cancer ([place])   . Squamous cell carcinoma of skin 08/10/2018   Left upper arm above elbow. WD SCC.  Marland Kitchen Squamous cell carcinoma of skin 09/07/2018   Right forearm, below elbow. WD SCC. [facility] 01/10/2019.  Marland Kitchen Stroke [facility])     Assessment: 75 yo male admitted with CC of weakness, confusion and increased falls.  Patient found to have hyponatremia. Sodium was 115 on admission. Provider wishes to start hypertonic (3%) Saline.  Pharmacy has been consulted for Na monitoring.   5/31 1550 Na = 115 5/31 1830 Na = 116   Goal of Therapy:  Increase of < 16mEq Na in 24 hours   Plan:  Start NaCl 3% at 52mL per hour. Na every 2 hours for 2 occurrences, and then every 4 hours thereafter.  If Sodium rises >67mEq over 2 hours or >23mEq in 4 hours, pharmacy will contact MD per consult.  Dorothe Pea, PharmD, BCPS Clinical Pharmacist 04/02/2021 10:08 PM

## 2021-04-02 NOTE — Progress Notes (Signed)
MEDICATION RELATED CONSULT NOTE - INITIAL   Pharmacy Consult for Hypertonic (3%) Saline Monitoring  Indication: Hyponatremia   Vital Signs:  Labs: Sodium  Date Value Ref Range Status  04/02/2021 115 (LL) 135 - 145 mmol/L Final    Comment:    CRITICAL RESULT CALLED TO, READ BACK BY AND VERIFIED WITH ISABELLA LAPIETRA AT 1615 04/02/21 DAS ELECTROLYTES REPEATED TO VERIFY SS   04/30/2014 136 136 - 145 mmol/L Final   Microbiology: No results found for this or any previous visit (from the past 720 hour(s)).  Medical History: Past Medical History:  Diagnosis Date  . Arrhythmia    atrial fibrillation  . Atrial fibrillation ([place])   . Basal cell carcinoma 05/2019   right nasal ala, Tx: EDC  . Basal cell carcinoma 04/12/2019   Left posterior ear. Nodular pattern, excoriated.   . CHF (congestive heart failure) ([place])   . COPD (chronic obstructive pulmonary disease) ([place])   . Coronary artery disease   . Diabetes mellitus without complication ([place])   . GERD (gastroesophageal reflux disease)   . HLD (hyperlipidemia)   . Hypertension   . Peripheral vascular disease ([place])   . Pneumonia   . Prostate cancer ([place])   . Squamous cell carcinoma of skin 08/10/2018   Left upper arm above elbow. WD SCC.  Marland Kitchen Squamous cell carcinoma of skin 09/07/2018   Right forearm, below elbow. WD SCC. [facility] 01/10/2019.  Marland Kitchen Stroke [facility])     Assessment: 75 yo male admitted with CC of weakness, confusion and increased falls.  Patient found to have hyponatremia. Sodium was 115 on admission. Provider wishes to start hypertonic (3%) Saline.  Pharmacy has been consulted for Na monitoring.   Goal of Therapy:  Increase of < 43mEq Na in 24 hours   Plan:  Start NaCl 3% at 49mL per hour. Na every 2 hours for 2 occurrences, and then every 4 hours thereafter.  If Sodium rises >31mEq over 2 hours or >81mEq in 4 hours, pharmacy will contact MD per consult.  Pernell Dupre, PharmD, BCPS Clinical Pharmacist 04/02/2021 6:43  PM

## 2021-04-03 DIAGNOSIS — R531 Weakness: Secondary | ICD-10-CM

## 2021-04-03 LAB — CBC WITH DIFFERENTIAL/PLATELET
Abs Immature Granulocytes: 0.05 10*3/uL (ref 0.00–0.07)
Basophils Absolute: 0 10*3/uL (ref 0.0–0.1)
Basophils Relative: 0 %
Eosinophils Absolute: 0 10*3/uL (ref 0.0–0.5)
Eosinophils Relative: 0 %
HCT: 22.8 % — ABNORMAL LOW (ref 39.0–52.0)
Hemoglobin: 7.2 g/dL — ABNORMAL LOW (ref 13.0–17.0)
Immature Granulocytes: 1 %
Lymphocytes Relative: 13 %
Lymphs Abs: 1.2 10*3/uL (ref 0.7–4.0)
MCH: 23.5 pg — ABNORMAL LOW (ref 26.0–34.0)
MCHC: 31.6 g/dL (ref 30.0–36.0)
MCV: 74.3 fL — ABNORMAL LOW (ref 80.0–100.0)
Monocytes Absolute: 0.7 10*3/uL (ref 0.1–1.0)
Monocytes Relative: 7 %
Neutro Abs: 7.6 10*3/uL (ref 1.7–7.7)
Neutrophils Relative %: 79 %
Platelets: 259 10*3/uL (ref 150–400)
RBC: 3.07 MIL/uL — ABNORMAL LOW (ref 4.22–5.81)
RDW: 17.6 % — ABNORMAL HIGH (ref 11.5–15.5)
WBC: 9.6 10*3/uL (ref 4.0–10.5)
nRBC: 0 % (ref 0.0–0.2)

## 2021-04-03 LAB — TYPE AND SCREEN
ABO/RH(D): O POS
Antibody Screen: NEGATIVE

## 2021-04-03 LAB — COMPREHENSIVE METABOLIC PANEL
ALT: 15 U/L (ref 0–44)
AST: 51 U/L — ABNORMAL HIGH (ref 15–41)
Albumin: 3.4 g/dL — ABNORMAL LOW (ref 3.5–5.0)
Alkaline Phosphatase: 101 U/L (ref 38–126)
Anion gap: 13 (ref 5–15)
BUN: 109 mg/dL — ABNORMAL HIGH (ref 8–23)
CO2: 26 mmol/L (ref 22–32)
Calcium: 8.1 mg/dL — ABNORMAL LOW (ref 8.9–10.3)
Chloride: 83 mmol/L — ABNORMAL LOW (ref 98–111)
Creatinine, Ser: 4.69 mg/dL — ABNORMAL HIGH (ref 0.61–1.24)
GFR, Estimated: 12 mL/min — ABNORMAL LOW (ref >60)
Glucose, Bld: 111 mg/dL — ABNORMAL HIGH (ref 70–99)
Potassium: 4.5 mmol/L (ref 3.5–5.1)
Sodium: 122 mmol/L — ABNORMAL LOW (ref 135–145)
Total Bilirubin: 0.7 mg/dL (ref 0.3–1.2)
Total Protein: 6.4 g/dL — ABNORMAL LOW (ref 6.5–8.1)

## 2021-04-03 LAB — CBG MONITORING, ED
Glucose-Capillary: 133 mg/dL — ABNORMAL HIGH (ref 70–99)
Glucose-Capillary: 160 mg/dL — ABNORMAL HIGH (ref 70–99)

## 2021-04-03 LAB — IRON AND TIBC
Iron: 17 ug/dL — ABNORMAL LOW (ref 45–182)
Saturation Ratios: 4 % — ABNORMAL LOW (ref 17.9–39.5)
TIBC: 455 ug/dL — ABNORMAL HIGH (ref 250–450)
UIBC: 438 ug/dL

## 2021-04-03 LAB — LACTIC ACID, PLASMA
Lactic Acid, Venous: 1.3 mmol/L (ref 0.5–1.9)
Lactic Acid, Venous: 1.3 mmol/L (ref 0.5–1.9)

## 2021-04-03 LAB — SODIUM
Sodium: 120 mmol/L — ABNORMAL LOW (ref 135–145)
Sodium: 123 mmol/L — ABNORMAL LOW (ref 135–145)

## 2021-04-03 LAB — TROPONIN I (HIGH SENSITIVITY)
Troponin I (High Sensitivity): 4862 ng/L (ref <18)
Troponin I (High Sensitivity): 4461 ng/L (ref <18)

## 2021-04-03 LAB — MAGNESIUM: Magnesium: 3.4 mg/dL — ABNORMAL HIGH (ref 1.7–2.4)

## 2021-04-03 MED ORDER — IPRATROPIUM-ALBUTEROL 0.5-2.5 (3) MG/3ML IN SOLN
3.0000 mL | Freq: Four times a day (QID) | RESPIRATORY_TRACT | Status: DC | PRN
  Filled 2021-04-03: qty 3

## 2021-04-03 MED ORDER — APIXABAN 2.5 MG PO TABS
2.5000 mg | ORAL_TABLET | Freq: Two times a day (BID) | ORAL | Status: DC
  Administered 2021-04-03 (×2): 2.5 mg via ORAL
  Filled 2021-04-03 (×3): qty 1

## 2021-04-03 MED ORDER — HYDROCORTISONE ACETATE 25 MG RE SUPP
25.0000 mg | Freq: Two times a day (BID) | RECTAL | Status: DC
  Administered 2021-04-03 – 2021-04-08 (×11): 25 mg via RECTAL
  Filled 2021-04-03 (×13): qty 1

## 2021-04-03 MED ORDER — INSULIN ASPART 100 UNIT/ML IJ SOLN
0.0000 [IU] | Freq: Three times a day (TID) | INTRAMUSCULAR | Status: DC
  Administered 2021-04-03: 1 [IU] via SUBCUTANEOUS
  Administered 2021-04-04: 3 [IU] via SUBCUTANEOUS
  Administered 2021-04-04: 5 [IU] via SUBCUTANEOUS
  Administered 2021-04-05: 1 [IU] via SUBCUTANEOUS
  Administered 2021-04-05: 3 [IU] via SUBCUTANEOUS
  Administered 2021-04-05 – 2021-04-06 (×2): 1 [IU] via SUBCUTANEOUS
  Administered 2021-04-06 – 2021-04-07 (×3): 2 [IU] via SUBCUTANEOUS
  Administered 2021-04-07: 3 [IU] via SUBCUTANEOUS
  Administered 2021-04-07: 5 [IU] via SUBCUTANEOUS
  Administered 2021-04-08: 2 [IU] via SUBCUTANEOUS
  Filled 2021-04-03 (×13): qty 1

## 2021-04-03 MED ORDER — SODIUM CHLORIDE 0.9 % IV SOLN
510.0000 mg | Freq: Once | INTRAVENOUS | Status: AC
  Administered 2021-04-03: 510 mg via INTRAVENOUS
  Filled 2021-04-03: qty 17

## 2021-04-03 MED ORDER — SODIUM CHLORIDE 0.9 % IV SOLN: INTRAVENOUS | Status: DC

## 2021-04-03 MED ORDER — INSULIN ASPART 100 UNIT/ML IJ SOLN
0.0000 [IU] | Freq: Every day | INTRAMUSCULAR | Status: DC
  Administered 2021-04-05 – 2021-04-06 (×2): 2 [IU] via SUBCUTANEOUS
  Filled 2021-04-03 (×2): qty 1

## 2021-04-03 MED ORDER — FERROUS SULFATE 325 (65 FE) MG PO TABS
325.0000 mg | ORAL_TABLET | Freq: Two times a day (BID) | ORAL | Status: DC
  Administered 2021-04-04 – 2021-04-08 (×8): 325 mg via ORAL
  Filled 2021-04-03 (×8): qty 1

## 2021-04-03 NOTE — Progress Notes (Signed)
MEDICATION RELATED CONSULT NOTE - INITIAL   Pharmacy Consult for Hypertonic (3%) Saline Monitoring  Indication: Hyponatremia   Vital Signs:  Labs: Sodium  Date Value Ref Range Status  04/03/2021 120 (L) 135 - 145 mmol/L Final    Comment:    Performed at [facility], [address] [place], [place] [address]  04/30/2014 136 136 - 145 mmol/L Final   Microbiology: Recent Results (from the past 720 hour(s))  Resp Panel by RT-PCR (Flu A&B, Covid) Nasopharyngeal Swab     Status: None   Collection Time: 04/02/21  4:39 PM   Specimen: Nasopharyngeal Swab; Nasopharyngeal(NP) swabs in vial transport medium  Result Value Ref Range Status   SARS Coronavirus 2 by RT PCR NEGATIVE NEGATIVE Final    Comment: (NOTE) SARS-CoV-2 target nucleic acids are NOT DETECTED.  The SARS-CoV-2 RNA is generally detectable in upper respiratory specimens during the acute phase of infection. The lowest concentration of SARS-CoV-2 viral copies this assay can detect is 138 copies/mL. A negative result does not preclude SARS-Cov-2 infection and should not be used as the sole basis for treatment or other patient management decisions. A negative result may occur with  improper specimen collection/handling, submission of specimen other than nasopharyngeal swab, presence of viral mutation(s) within the areas targeted by this assay, and inadequate number of viral copies(<138 copies/mL). A negative result must be combined with clinical observations, patient history, and epidemiological information. The expected result is Negative.  Fact Sheet for Patients:  EntrepreneurPulse.com.au  Fact Sheet for Healthcare Providers:  IncredibleEmployment.be  This test is no t yet approved or cleared by the [place] FDA and  has been authorized for detection and/or diagnosis of SARS-CoV-2 by FDA under an Emergency Use Authorization (EUA). This EUA will remain  in effect  (meaning this test can be used) for the duration of the COVID-19 declaration under Section 564(b)(1) of the Act, 21 U.S.C.section 360bbb-3(b)(1), unless the authorization is terminated  or revoked sooner.       Influenza A by PCR NEGATIVE NEGATIVE Final   Influenza B by PCR NEGATIVE NEGATIVE Final    Comment: (NOTE) The Xpert Xpress SARS-CoV-2/FLU/RSV plus assay is intended as an aid in the diagnosis of influenza from Nasopharyngeal swab specimens and should not be used as a sole basis for treatment. Nasal washings and aspirates are unacceptable for Xpert Xpress SARS-CoV-2/FLU/RSV testing.  Fact Sheet for Patients: EntrepreneurPulse.com.au  Fact Sheet for Healthcare Providers: IncredibleEmployment.be  This test is not yet approved or cleared by the [place] FDA and has been authorized for detection and/or diagnosis of SARS-CoV-2 by FDA under an Emergency Use Authorization (EUA). This EUA will remain in effect (meaning this test can be used) for the duration of the COVID-19 declaration under Section 564(b)(1) of the Act, 21 U.S.C. section 360bbb-3(b)(1), unless the authorization is terminated or revoked.  Performed at [facility], [address], [place], [place] [address]     Medical History: Past Medical History:  Diagnosis Date  . Arrhythmia    atrial fibrillation  . Atrial fibrillation ([place])   . Basal cell carcinoma 05/2019   right nasal ala, Tx: EDC  . Basal cell carcinoma 04/12/2019   Left posterior ear. Nodular pattern, excoriated.   . CHF (congestive heart failure) ([place])   . COPD (chronic obstructive pulmonary disease) ([place])   . Coronary artery disease   . Diabetes mellitus without complication ([place])   . GERD (gastroesophageal reflux disease)   . HLD (hyperlipidemia)   .  Hypertension   . Peripheral vascular disease ([place])   . Pneumonia   . Prostate cancer ([place])   . Squamous cell carcinoma of skin  08/10/2018   Left upper arm above elbow. WD SCC.  Marland Kitchen Squamous cell carcinoma of skin 09/07/2018   Right forearm, below elbow. WD SCC. [facility] 01/10/2019.  Marland Kitchen Stroke [facility])     Assessment: 75 yo male admitted with CC of weakness, confusion and increased falls.  Patient found to have hyponatremia. Sodium was 115 on admission. Provider wishes to start hypertonic (3%) Saline.  Pharmacy has been consulted for Na monitoring.   5/31 1550 Na = 115 5/31 1830 Na = 116 6/01 0140 Na= 120   Goal of Therapy:  Increase of < 57mEq Na in 24 hours   Plan:  Start NaCl 3% at 82mL per hour. Na every 2 hours for 2 occurrences, and then every 4 hours thereafter.  If Sodium rises >63mEq over 2 hours or >29mEq in 4 hours, pharmacy will contact MD per consult.  Renda Rolls, PharmD, [facility] 04/03/2021 2:31 AM

## 2021-04-03 NOTE — ED Notes (Signed)
Ardeen Jourdain, MD notified via secure chat of critical result: troponin 4461 ng/L

## 2021-04-03 NOTE — ED Notes (Signed)
Pt changed , given new linens

## 2021-04-03 NOTE — Progress Notes (Signed)
[facility]  ROUNDING NOTE   Subjective:   [name] is admitted to [facility] on 04/02/2021 for Hyponatremia [E87.1] Na 115.  Started on hypertonic saline. Sodium improved to 122.   Objective:  Vital signs in last 24 hours:  Temp:  [98.4 F (36.9 C)] 98.4 F (36.9 C) (05/31 1537) Pulse Rate:  [28-62] 62 (06/01 1130) Resp:  [10-21] 13 (06/01 1030) BP: (92-137)/(34-94) 128/62 (06/01 1130) SpO2:  [92 %-100 %] 97 % (06/01 1130) Weight:  [65.8 kg] 65.8 kg (05/31 1538)  Weight change:  Filed Weights   04/02/21 1538  Weight: 65.8 kg    Intake/Output: No intake/output data recorded.   Intake/Output this shift:  Total I/O In: -  Out: 600 [Urine:600]  Physical Exam: General: NAD  Head: Normocephalic, atraumatic. Dry oral mucosal membranes  Eyes: Anicteric, PERRL  Neck: Supple, trachea midline  Lungs:  Clear to auscultation  Heart: Regular rate and rhythm  Abdomen:  Soft, nontender,   Extremities: no peripheral edema.  Neurologic: altered  Skin: No lesions       Basic Metabolic Panel: Recent Labs  Lab 04/02/21 1550 04/02/21 1832 04/03/21 0140 04/03/21 0357  NA 115* 116* 120* 122*  K 5.0  --   --  4.5  CL 73*  --   --  83*  CO2 20*  --   --  26  GLUCOSE 192*  --   --  111*  BUN 114*  --   --  109*  CREATININE 5.48*  --   --  4.69*  CALCIUM 8.5*  --   --  8.1*  MG  --   --   --  3.4*    Liver Function Tests: Recent Labs  Lab 04/02/21 1639 04/03/21 0357  AST 44* 51*  ALT 14 15  ALKPHOS 109 101  BILITOT 0.8 0.7  PROT 6.5 6.4*  ALBUMIN 3.5 3.4*   No results for input(s): LIPASE, AMYLASE in the last 168 hours. Recent Labs  Lab 04/02/21 1639  AMMONIA <9*    CBC: Recent Labs  Lab 04/02/21 1550 04/03/21 0357  WBC 17.5* 9.6  NEUTROABS  --  7.6  HGB 7.3* 7.2*  HCT 23.1* 22.8*  MCV 73.6* 74.3*  PLT 273 259    Cardiac Enzymes: No results for input(s): CKTOTAL, CKMB, CKMBINDEX, TROPONINI in the last 168 hours.  BNP: Invalid  input(s): POCBNP  CBG: No results for input(s): GLUCAP in the last 168 hours.  Microbiology: Results for orders placed or performed during the hospital encounter of 04/02/21  Resp Panel by RT-PCR (Flu A&B, Covid) Nasopharyngeal Swab     Status: None   Collection Time: 04/02/21  4:39 PM   Specimen: Nasopharyngeal Swab; Nasopharyngeal(NP) swabs in vial transport medium  Result Value Ref Range Status   SARS Coronavirus 2 by RT PCR NEGATIVE NEGATIVE Final    Comment: (NOTE) SARS-CoV-2 target nucleic acids are NOT DETECTED.  The SARS-CoV-2 RNA is generally detectable in upper respiratory specimens during the acute phase of infection. The lowest concentration of SARS-CoV-2 viral copies this assay can detect is 138 copies/mL. A negative result does not preclude SARS-Cov-2 infection and should not be used as the sole basis for treatment or other patient management decisions. A negative result may occur with  improper specimen collection/handling, submission of specimen other than nasopharyngeal swab, presence of viral mutation(s) within the areas targeted by this assay, and inadequate number of viral copies(<138 copies/mL). A negative result must be combined with clinical observations,  patient history, and epidemiological information. The expected result is Negative.  Fact Sheet for Patients:  EntrepreneurPulse.com.au  Fact Sheet for Healthcare Providers:  IncredibleEmployment.be  This test is no t yet approved or cleared by the [place] FDA and  has been authorized for detection and/or diagnosis of SARS-CoV-2 by FDA under an Emergency Use Authorization (EUA). This EUA will remain  in effect (meaning this test can be used) for the duration of the COVID-19 declaration under Section 564(b)(1) of the Act, 21 U.S.C.section 360bbb-3(b)(1), unless the authorization is terminated  or revoked sooner.       Influenza A by PCR NEGATIVE NEGATIVE Final    Influenza B by PCR NEGATIVE NEGATIVE Final    Comment: (NOTE) The Xpert Xpress SARS-CoV-2/FLU/RSV plus assay is intended as an aid in the diagnosis of influenza from Nasopharyngeal swab specimens and should not be used as a sole basis for treatment. Nasal washings and aspirates are unacceptable for Xpert Xpress SARS-CoV-2/FLU/RSV testing.  Fact Sheet for Patients: EntrepreneurPulse.com.au  Fact Sheet for Healthcare Providers: IncredibleEmployment.be  This test is not yet approved or cleared by the [place] FDA and has been authorized for detection and/or diagnosis of SARS-CoV-2 by FDA under an Emergency Use Authorization (EUA). This EUA will remain in effect (meaning this test can be used) for the duration of the COVID-19 declaration under Section 564(b)(1) of the Act, 21 U.S.C. section 360bbb-3(b)(1), unless the authorization is terminated or revoked.  Performed at [facility], [address] [place], [place] [address]   Blood culture (single)     Status: None (Preliminary result)   Collection Time: 04/02/21  4:39 PM   Specimen: BLOOD  Result Value Ref Range Status   Specimen Description BLOOD LEFT ANTECUBITAL  Final   Special Requests BOTTLES DRAWN AEROBIC AND ANAEROBIC LOW VOLUME  Final   Culture   Final    NO GROWTH < 24 HOURS Performed at [facility], [address] [place], [place] [address]    Report Status PENDING  Incomplete    Coagulation Studies: Recent Labs    04/02/21 1639  LABPROT 16.2*  INR 1.3*    Urinalysis: Recent Labs    04/02/21 1639  COLORURINE AMBER*  LABSPEC 1.015  PHURINE 5.0  GLUCOSEU NEGATIVE  HGBUR NEGATIVE  BILIRUBINUR NEGATIVE  KETONESUR NEGATIVE  PROTEINUR NEGATIVE  NITRITE NEGATIVE  LEUKOCYTESUR NEGATIVE      Imaging: DG Chest 1 View  Result Date: 04/02/2021 CLINICAL DATA:  Confusion, weakness and elevated white blood cell count. EXAM: CHEST  1 VIEW  COMPARISON:  Single-view of the chest 02/21/2021 and 03/01/2021. CT chest 02/02/2021. FINDINGS: The lungs are emphysematous. There is left basilar atelectasis. The right lung is clear. Cardiomegaly. The patient is status post CABG. Aortic atherosclerosis. No pneumothorax or pleural fluid. IMPRESSION: No acute disease. Left basilar atelectasis. Cardiomegaly. Aortic Atherosclerosis (ICD10-I70.0) and Emphysema (ICD10-J43.9). Electronically Signed   By: Inge Rise M.D.   On: 04/02/2021 17:04   CT Head Wo Contrast  Result Date: 04/02/2021 CLINICAL DATA:  Generalized weakness and altered mental status EXAM: CT HEAD WITHOUT CONTRAST TECHNIQUE: Contiguous axial images were obtained from the base of the skull through the vertex without intravenous contrast. COMPARISON:  02/19/2021 FINDINGS: Brain: No evidence of acute infarction, hemorrhage, hydrocephalus, extra-axial collection or mass lesion/mass effect. Mild atrophic and chronic white matter ischemic changes are noted. Vascular: No hyperdense vessel or unexpected calcification. Skull: Normal. Negative for fracture or focal lesion. Sinuses/Orbits: No acute finding. Other: None. IMPRESSION: Chronic  atrophic and ischemic changes without acute abnormality. Electronically Signed   By: Inez Catalina M.D.   On: 04/02/2021 16:46     Medications:   . sodium chloride 75 mL/hr at 04/03/21 0926   . apixaban  2.5 mg Oral BID  . [START ON 04/04/2021] ferrous sulfate  325 mg Oral BID WC  . insulin glargine  10 Units Subcutaneous Q2200  . pantoprazole  40 mg Oral Daily  . simvastatin  20 mg Oral q AM  . tiotropium  18 mcg Inhalation Daily  . traZODone  50 mg Oral QHS   acetaminophen **OR** acetaminophen, ipratropium-albuterol, ondansetron **OR** ondansetron (ZOFRAN) IV, polyethylene glycol  Assessment/ Plan:  [name] is a 75 y.o. white male with CVA, prostate cancer, peripheral vascular disease, hypertension, hyperlipidemia, GERD, diabetes mellitus type  II, COPD, congestive heart failure, atrial fibrillation who is admitted to [facility] on 04/02/2021 for Hyponatremia [E87.1]  1. Hyponatremia: acute on chronic. Baseline of 130-135.  Poor PO intake and continued use of metalozone.  Emergent hypertonic saline administered overnight.  Remains hypovolumic - 0.9 saline infusion.   2. Acute kidney injury: on chronic kidney disease stage IIIB with history of proteinuria. Baseline creatinine of 1.9, GFR of 35 on 12/14/2020.  AKI consistent with prerenal azotemia No IV contrast exposure.  Chronic Kidney Disease secondary to diabetic nephropathy and hypertension.  - Continue IV fluids.   3. Hypertension: hypovolumic. Holding home medications. Echocardiogram from 11/15/2020 with normal diastolic and systolic function.  Holding home regimen of metolazone and torsemide.   4.  Anemia with renal failure: hemoglobin 7.2, microcytic. Consistent with iron deficiency    LOS: 1 [name] 6/1/202212:18 PM

## 2021-04-03 NOTE — Progress Notes (Signed)
[facility]) Hospital Liaison note:  This is a pending outpatient-based Palliative Care patient. Will continue to follow for disposition.  Please call with any outpatient palliative questions or concerns.  Thank you, Lorelee Market, LPN [facility] Liaison [phone]

## 2021-04-03 NOTE — Progress Notes (Addendum)
PROGRESS NOTE    [name]  XTG:[id number] DOB: June 16, 1946 DOA: 04/02/2021 PCP: [name], MD   Brief Narrative: Taken from H&P. 75 year old male with past medical history of chronic kidney disease stage IIIb, diastolic congestive heart failure (Echo 11/2020 EF 50-55%), diabetes mellitus type 2, coronary artery disease status post CABG presented to [facility] emergency department with complaints of weakness. He was found to have hyponatremia with sodium of 115 with AKI.  Initially started on hypertonic saline, which was switched with normal saline today by nephrology.  Of note, patient was recently hospitalized at [facility] from 4/19 until 4/30.  Patient was admitted for acute kidney injury with ATN with volume overload.  Patient required a furosemide infusion for portion of the hospitalization and was eventually transitioned over to a combination of torsemide and metolazone at time of discharge.  Patient was eventually discharged on 4/30.  Subjective: Patient appears lethargic but able to follow commands and answer questions appropriately.  Stating that his weakness seems little improved.  He was feeling hungry and would like to get some food.  Assessment & Plan:   Active Problems:   Chronic systolic heart failure (HCC)   Essential hypertension   COPD (chronic obstructive pulmonary disease) with chronic bronchitis (HCC)   Acute renal failure superimposed on stage 3b chronic kidney disease (HCC)   Atrial fibrillation, chronic (HCC)   Weakness generalized   Hyponatremia  Acute on chronic hyponatremia.  Baseline sodium of 1 30-1 35.  Most likely secondary to poor p.o. intake and the use of diuretics.  Initially requiring hypertonic saline.  Sodium improved to 122 today.  Nephrology is following-appreciate their help. -Hypertonic saline is being switched with normal saline. -Continue to monitor sodium. -Might need to stop diuretics on discharge and he can have a close follow-up with his  provider.  AKI with CKD stage IIIb.  Baseline creatinine around 1.9.  Most likely prerenal. Creatinine at 4.09 with BUN of 109 today. -Continue with IV fluid -Monitor renal function -Avoid nephrotoxins  Elevated troponin.  No chest pain but troponin was elevated and 600 which can be due to NSTEMI. -Repeat troponin -Cardiology consult  Lactic acidosis.  Lactic acid on admission was 3.7 -Repeat lactic acid -Continue with IV fluid  Hypertension.  Blood pressure currently within goal.  Home antihypertensives are being held due to softer blood pressure on admission. -Continue holding antihypertensives and monitor.  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 Give him 1 dose of Feraheme -Start him on iron supplement -We will get benefit with EPO -Monitor hemoglobin -Transfuse if below 7  Chronic HFpEF.  According to echocardiogram done in January 2022, EF was 50 to 55% and normal diastolic function.  Mildly decreased or within lower normal limit of EF. Patient appears dry. -Keep holding home dose of torsemide and metolazone-can discontinue on discharge with a close outpatient follow-up.  Chronic atrial fibrillation.  Rate controlled -Continue home dose of Eliquis  GERD. -Continue PPI  Type 2 diabetes mellitus with CKD stage IIIb and long-term use of insulin.  Uses 10 units of Lantus at bedtime along with SSI at home. -Continue home dose of Lantus -Continue with SSI  History of COPD with chronic bronchitis.  No concern of exacerbation at this time. -Continue to monitor  Objective: Vitals:   04/03/21 1030 04/03/21 1100 04/03/21 1130 04/03/21 1430  BP: (!) 110/47 (!) 113/92 128/62 (!) 125/41  Pulse:  (!) 49 62 (!) 51  Resp: 13   14  Temp:      TempSrc:      SpO2:  99% 97% 100%  Weight:      Height:        Intake/Output Summary (Last 24 hours) at 04/03/2021 1525 Last data filed at 04/03/2021 0728 Gross per 24 hour  Intake --  Output 600 ml  Net  -600 ml   Filed Weights   04/02/21 1538  Weight: 65.8 kg    Examination:  General exam: Appears calm and comfortable, mucous membranes appears dry. Respiratory system: Clear to auscultation. Respiratory effort normal. Cardiovascular system: S1 & S2 heard, RRR. No JVD, murmurs, rubs, gallops or clicks. Gastrointestinal system: Soft, nontender, nondistended, bowel sounds positive. Central nervous system: Alert and oriented. No focal neurological deficits. Extremities: No edema, no cyanosis, pulses intact and symmetrical. Skin: Small skin abrasions present bilaterally on legs. Psychiatry: Judgement and insight appear normal.    DVT prophylaxis: Eliquis Code Status: Full Family Communication: Wife was updated on phone Disposition Plan:  Status is: Inpatient  Remains inpatient appropriate because:Inpatient level of care appropriate due to severity of illness   Dispo: The patient is from: Home              Anticipated d/c is to: Home              Patient currently is not medically stable to d/c.   Difficult to place patient No                Level of care: Stepdown  All the records are reviewed and case discussed with Care Management/Social Worker. Management plans discussed with the patient, nursing and they are in agreement.  Consultants:   Nephrology  Procedures:  Antimicrobials:   Data Reviewed: I have personally reviewed following labs and imaging studies  CBC: Recent Labs  Lab 04/02/21 1550 04/03/21 0357  WBC 17.5* 9.6  NEUTROABS  --  7.6  HGB 7.3* 7.2*  HCT 23.1* 22.8*  MCV 73.6* 74.3*  PLT 273 983   Basic Metabolic Panel: Recent Labs  Lab 04/02/21 1550 04/02/21 1832 04/03/21 0140 04/03/21 0357  NA 115* 116* 120* 122*  K 5.0  --   --  4.5  CL 73*  --   --  83*  CO2 20*  --   --  26  GLUCOSE 192*  --   --  111*  BUN 114*  --   --  109*  CREATININE 5.48*  --   --  4.69*  CALCIUM 8.5*  --   --  8.1*  MG  --   --   --  3.4*   GFR: Estimated  Creatinine Clearance: 12.3 mL/min (A) (by C-G formula based on SCr of 4.69 mg/dL (H)). Liver Function Tests: Recent Labs  Lab 04/02/21 1639 04/03/21 0357  AST 44* 51*  ALT 14 15  ALKPHOS 109 101  BILITOT 0.8 0.7  PROT 6.5 6.4*  ALBUMIN 3.5 3.4*   No results for input(s): LIPASE, AMYLASE in the last 168 hours. Recent Labs  Lab 04/02/21 1639  AMMONIA <9*   Coagulation Profile: Recent Labs  Lab 04/02/21 1639  INR 1.3*   Cardiac Enzymes: No results for input(s): CKTOTAL, CKMB, CKMBINDEX, TROPONINI in the last 168 hours. BNP (last 3 results) No results for input(s): PROBNP in the last 8760 hours. HbA1C: No results for input(s): HGBA1C in the last 72 hours. CBG: No results for input(s): GLUCAP in the last 168 hours. Lipid Profile: No results for input(s): CHOL, HDL,  LDLCALC, TRIG, CHOLHDL, LDLDIRECT in the last 72 hours. Thyroid Function Tests: Recent Labs    04/02/21 1639  TSH 5.348*   Anemia Panel: Recent Labs    04/03/21 0357  TIBC 455*  IRON 17*   Sepsis Labs: Recent Labs  Lab 04/02/21 1639  LATICACIDVEN 3.7*    Recent Results (from the past 240 hour(s))  Resp Panel by RT-PCR (Flu A&B, Covid) Nasopharyngeal Swab     Status: None   Collection Time: 04/02/21  4:39 PM   Specimen: Nasopharyngeal Swab; Nasopharyngeal(NP) swabs in vial transport medium  Result Value Ref Range Status   SARS Coronavirus 2 by RT PCR NEGATIVE NEGATIVE Final    Comment: (NOTE) SARS-CoV-2 target nucleic acids are NOT DETECTED.  The SARS-CoV-2 RNA is generally detectable in upper respiratory specimens during the acute phase of infection. The lowest concentration of SARS-CoV-2 viral copies this assay can detect is 138 copies/mL. A negative result does not preclude SARS-Cov-2 infection and should not be used as the sole basis for treatment or other patient management decisions. A negative result may occur with  improper specimen collection/handling, submission of specimen  other than nasopharyngeal swab, presence of viral mutation(s) within the areas targeted by this assay, and inadequate number of viral copies(<138 copies/mL). A negative result must be combined with clinical observations, patient history, and epidemiological information. The expected result is Negative.  Fact Sheet for Patients:  EntrepreneurPulse.com.au  Fact Sheet for Healthcare Providers:  IncredibleEmployment.be  This test is no t yet approved or cleared by the [place] FDA and  has been authorized for detection and/or diagnosis of SARS-CoV-2 by FDA under an Emergency Use Authorization (EUA). This EUA will remain  in effect (meaning this test can be used) for the duration of the COVID-19 declaration under Section 564(b)(1) of the Act, 21 U.S.C.section 360bbb-3(b)(1), unless the authorization is terminated  or revoked sooner.       Influenza A by PCR NEGATIVE NEGATIVE Final   Influenza B by PCR NEGATIVE NEGATIVE Final    Comment: (NOTE) The Xpert Xpress SARS-CoV-2/FLU/RSV plus assay is intended as an aid in the diagnosis of influenza from Nasopharyngeal swab specimens and should not be used as a sole basis for treatment. Nasal washings and aspirates are unacceptable for Xpert Xpress SARS-CoV-2/FLU/RSV testing.  Fact Sheet for Patients: EntrepreneurPulse.com.au  Fact Sheet for Healthcare Providers: IncredibleEmployment.be  This test is not yet approved or cleared by the [place] FDA and has been authorized for detection and/or diagnosis of SARS-CoV-2 by FDA under an Emergency Use Authorization (EUA). This EUA will remain in effect (meaning this test can be used) for the duration of the COVID-19 declaration under Section 564(b)(1) of the Act, 21 U.S.C. section 360bbb-3(b)(1), unless the authorization is terminated or revoked.  Performed at [facility], [address]  [place], [place] [address]   Blood culture (single)     Status: None (Preliminary result)   Collection Time: 04/02/21  4:39 PM   Specimen: BLOOD  Result Value Ref Range Status   Specimen Description BLOOD LEFT ANTECUBITAL  Final   Special Requests BOTTLES DRAWN AEROBIC AND ANAEROBIC LOW VOLUME  Final   Culture   Final    NO GROWTH < 24 HOURS Performed at [facility], [address], [place], [place] [address]    Report Status PENDING  Incomplete     Radiology Studies: DG Chest 1 View  Result Date: 04/02/2021 CLINICAL DATA:  Confusion, weakness and elevated white blood cell count. EXAM:  CHEST  1 VIEW COMPARISON:  Single-view of the chest 02/21/2021 and 03/01/2021. CT chest 02/02/2021. FINDINGS: The lungs are emphysematous. There is left basilar atelectasis. The right lung is clear. Cardiomegaly. The patient is status post CABG. Aortic atherosclerosis. No pneumothorax or pleural fluid. IMPRESSION: No acute disease. Left basilar atelectasis. Cardiomegaly. Aortic Atherosclerosis (ICD10-I70.0) and Emphysema (ICD10-J43.9). Electronically Signed   By: Inge Rise M.D.   On: 04/02/2021 17:04   CT Head Wo Contrast  Result Date: 04/02/2021 CLINICAL DATA:  Generalized weakness and altered mental status EXAM: CT HEAD WITHOUT CONTRAST TECHNIQUE: Contiguous axial images were obtained from the base of the skull through the vertex without intravenous contrast. COMPARISON:  02/19/2021 FINDINGS: Brain: No evidence of acute infarction, hemorrhage, hydrocephalus, extra-axial collection or mass lesion/mass effect. Mild atrophic and chronic white matter ischemic changes are noted. Vascular: No hyperdense vessel or unexpected calcification. Skull: Normal. Negative for fracture or focal lesion. Sinuses/Orbits: No acute finding. Other: None. IMPRESSION: Chronic atrophic and ischemic changes without acute abnormality. Electronically Signed   By: Inez Catalina M.D.   On: 04/02/2021 16:46    Scheduled  Meds: . apixaban  2.5 mg Oral BID  . [START ON 04/04/2021] ferrous sulfate  325 mg Oral BID WC  . hydrocortisone  25 mg Rectal BID  . insulin glargine  10 Units Subcutaneous Q2200  . pantoprazole  40 mg Oral Daily  . simvastatin  20 mg Oral q AM  . tiotropium  18 mcg Inhalation Daily  . traZODone  50 mg Oral QHS   Continuous Infusions: . sodium chloride 75 mL/hr at 04/03/21 0926     LOS: 1 day   Time spent: 35 minutes. More than 50% of the time was spent in counseling/coordination of care  Lorella Nimrod, MD [facility]  If 7PM-7AM, please contact night-coverage [email or web address]  04/03/2021, 3:25 PM   This record has been created using Systems analyst. Errors have been sought and corrected,but may not always be located. Such creation errors do not reflect on the standard of care.

## 2021-04-03 NOTE — ED Notes (Signed)
Wife at bedside.

## 2021-04-04 ENCOUNTER — Encounter: Payer: Self-pay | Admitting: Internal Medicine

## 2021-04-04 LAB — BASIC METABOLIC PANEL
Anion gap: 14 (ref 5–15)
BUN: 93 mg/dL — ABNORMAL HIGH (ref 8–23)
CO2: 22 mmol/L (ref 22–32)
Calcium: 8.1 mg/dL — ABNORMAL LOW (ref 8.9–10.3)
Chloride: 91 mmol/L — ABNORMAL LOW (ref 98–111)
Creatinine, Ser: 3.37 mg/dL — ABNORMAL HIGH (ref 0.61–1.24)
GFR, Estimated: 18 mL/min — ABNORMAL LOW (ref >60)
Glucose, Bld: 136 mg/dL — ABNORMAL HIGH (ref 70–99)
Potassium: 4.3 mmol/L (ref 3.5–5.1)
Sodium: 127 mmol/L — ABNORMAL LOW (ref 135–145)

## 2021-04-04 LAB — APTT
aPTT: 39 seconds — ABNORMAL HIGH (ref 24–36)
aPTT: 43 seconds — ABNORMAL HIGH (ref 24–36)

## 2021-04-04 LAB — TROPONIN I (HIGH SENSITIVITY)
Troponin I (High Sensitivity): 2971 ng/L (ref <18)
Troponin I (High Sensitivity): 2393 ng/L (ref <18)
Troponin I (High Sensitivity): 1845 ng/L (ref <18)

## 2021-04-04 LAB — CBG MONITORING, ED
Glucose-Capillary: 127 mg/dL — ABNORMAL HIGH (ref 70–99)
Glucose-Capillary: 237 mg/dL — ABNORMAL HIGH (ref 70–99)
Glucose-Capillary: 251 mg/dL — ABNORMAL HIGH (ref 70–99)

## 2021-04-04 LAB — CBC
HCT: 25 % — ABNORMAL LOW (ref 39.0–52.0)
Hemoglobin: 7.9 g/dL — ABNORMAL LOW (ref 13.0–17.0)
MCH: 23.9 pg — ABNORMAL LOW (ref 26.0–34.0)
MCHC: 31.6 g/dL (ref 30.0–36.0)
MCV: 75.5 fL — ABNORMAL LOW (ref 80.0–100.0)
Platelets: 239 10*3/uL (ref 150–400)
RBC: 3.31 MIL/uL — ABNORMAL LOW (ref 4.22–5.81)
RDW: 18.6 % — ABNORMAL HIGH (ref 11.5–15.5)
WBC: 13.7 10*3/uL — ABNORMAL HIGH (ref 4.0–10.5)
nRBC: 0 % (ref 0.0–0.2)

## 2021-04-04 LAB — GLUCOSE, CAPILLARY: Glucose-Capillary: 181 mg/dL — ABNORMAL HIGH (ref 70–99)

## 2021-04-04 LAB — HEPARIN LEVEL (UNFRACTIONATED): Heparin Unfractionated: >1.1 IU/mL — ABNORMAL HIGH (ref 0.30–0.70)

## 2021-04-04 LAB — HEMOGLOBIN A1C
Hgb A1c MFr Bld: 6.8 % — ABNORMAL HIGH (ref 4.8–5.6)
Mean Plasma Glucose: 148 mg/dL

## 2021-04-04 MED ORDER — HEPARIN BOLUS VIA INFUSION
3000.0000 [IU] | Freq: Once | INTRAVENOUS | Status: AC
  Administered 2021-04-04: 3000 [IU] via INTRAVENOUS
  Filled 2021-04-04: qty 3000

## 2021-04-04 MED ORDER — HEPARIN BOLUS VIA INFUSION
2000.0000 [IU] | Freq: Once | INTRAVENOUS | Status: AC
  Administered 2021-04-04: 2000 [IU] via INTRAVENOUS
  Filled 2021-04-04: qty 2000

## 2021-04-04 MED ORDER — HEPARIN (PORCINE) 25000 UT/250ML-% IV SOLN
1100.0000 [IU]/h | INTRAVENOUS | Status: DC
  Administered 2021-04-04: 750 [IU]/h via INTRAVENOUS
  Filled 2021-04-04: qty 250

## 2021-04-04 NOTE — Consult Note (Signed)
ANTICOAGULATION CONSULT NOTE - Consult  Pharmacy Consult for Heparin gtt Indication: chest pain/ACS  Allergies  Allergen Reactions  . Lactose Intolerance (Gi)     Patient Measurements: Height: 5\' 6"  (167.6 cm) Weight: 70.3 kg (154 lb 15.7 oz) IBW/kg (Calculated) : 63.8 Heparin Dosing Weight: 65.8kg  Vital Signs: Temp: 97.7 F (36.5 C) (06/02 2009) Temp Source: Oral (06/02 2009) BP: 116/44 (06/02 2009) Pulse Rate: 65 (06/02 2009)  Labs: Recent Labs    04/02/21 1550 04/02/21 1639 04/03/21 0357 04/03/21 1749 04/04/21 0026 04/04/21 0457 04/04/21 0843 04/04/21 1217 04/04/21 1948  HGB 7.3*  --  7.2*  --   --  7.9*  --   --   --   HCT 23.1*  --  22.8*  --   --  25.0*  --   --   --   PLT 273  --  259  --   --  239  --   --   --   APTT  --   --   --   --   --   --  39*  --  43*  LABPROT  --  16.2*  --   --   --   --   --   --   --   INR  --  1.3*  --   --   --   --   --   --   --   HEPARINUNFRC  --   --   --   --   --   --  >1.10*  --   --   CREATININE 5.48*  --  4.69*  --   --  3.37*  --   --   --   TROPONINIHS  --  601*  --    < > 2,971* 2,393*  --  1,845*  --    < > = values in this interval not displayed.    Estimated Creatinine Clearance: 17.1 mL/min (A) (by C-G formula based on SCr of 3.37 mg/dL (H)).   Medications:  apixaban 2.5 BID (last dose 6/01 @2153 )  Heparin Dosing Weight: 65.8kg  Assessment: 75yo male w/ h/o CAD (s/p CABG), Afib (on eliquis 2.5), CHF, HLD, HTN, PVD, CVA, & DM presenting with NSTEMI. Troponins [id number]. Pt taking Eliquis PTA for Afib, last dose 6/01 @2153 . Pharmacy consulted for transition of eliquis to hep gtt.  Date Time aPTT/HL Rate/Comment 6/2 1948 43sec  715ml/hr      Baseline Labs: aPTT - sent/pending Anti-Xa - sent/pending Hgb - 7.3>7.2>7.9 (prior to heparin start) Plts - [id number] (prior to heparin start)  Goal of Therapy:  Heparin level 0.3-0.7 units/ml aPTT 66-102 seconds Monitor platelets by anticoagulation  protocol: Yes   Plan:  Note: Last dose of Eliquis 2.5mg  6/01 @2159 ;   6/2@1948 : aPTT 43sec, subtherapeutic  2000 units bolus x 1 Increase heparin infusion to 950 units/hr Will recheck aPTT and HL with AM labs Titrate by aPTT alone until lab correlation noted, then titrate by anti-xa alone. Continue to monitor H&H and platelets  Pearla Dubonnet 04/04/2021,8:43 PM

## 2021-04-04 NOTE — ED Notes (Signed)
Pt c/o of RLE pain. No redness or swelling noted. Pt denies injury and states this has been going on for weeks. Pt offered Tylenol but refuses and states "Tylenol won;t do shit". Ardeen Jourdain, MD notified. No new orders at this time.

## 2021-04-04 NOTE — Evaluation (Signed)
Physical Therapy Evaluation Patient Details Name: [name] MRN: [id number] DOB: 1946-04-02 Today's Date: 04/04/2021   History of Present Illness  Pt is a 75 y/o male w/ hx of CVA, PNA, PVD, HTN, HLD, GERD, DM, CAD, COPD, CHF s/p CABG, presented to ED w/ SOB. Admitted for acute hyponatremia.    Clinical Impression  Pt received in Semi-Fowler's position and agreeable to therapy.  Pt immediately reports that he does not want to get up out of bed with therapy, because he can't.  Pt also reluctant to perform bed-level exercises, but performed after minimal encouragement from therapist.  Pt performed LE exercises with good technique and without much difficulty.  Pt was able to perform bed mobility in order to scoot towards [facility] with use of verbal and tactile cues.  Due to lack of motivation for mobility and current weakness in LEs, pt would benefit from SNF at this current time.  Pt will continue to benefit from skilled therapy in order to address deficits listed below.     Follow Up Recommendations SNF;Supervision/Assistance - 24 hour    Equipment Recommendations       Recommendations for Other Services       Precautions / Restrictions Precautions Precautions: None Restrictions Weight Bearing Restrictions: No      Mobility  Bed Mobility               General bed mobility comments: Pt refuses to do any mobility today due to pain.    Transfers                    Ambulation/Gait                Stairs            Wheelchair Mobility    Modified Rankin (Stroke Patients Only)       Balance                                             Pertinent Vitals/Pain Pain Assessment: Faces Faces Pain Scale: Hurts whole lot Pain Location: Hemorrhoids Pain Descriptors / Indicators: Throbbing Pain Intervention(s): Limited activity within patient's tolerance    Home Living Family/patient expects to be discharged to:: Private  residence Living Arrangements: Spouse/significant other Available Help at Discharge: Family;Available PRN/intermittently Type of Home: House Home Access: Stairs to enter Entrance Stairs-Rails: Psychiatric nurse of Steps: 3-4 Home Layout: One level Home Equipment: Shower seat;Cane - single point;Wheelchair - Press photographer;Bedside commode;Walker - 4 wheels;Walker - 2 wheels      Prior Function Level of Independence: Needs assistance   Gait / Transfers Assistance Needed: MOD I with [name] household distances, electric scooter community distances. Endorsed that his wife will help him with ambulation as needed  ADL's / Homemaking Assistance Needed: wife assists with meals, med management, lower body dressing, sponge bathing, and transportation  Comments: Home O2 since last admission.  Reports he has had a few falls.     Hand Dominance   Dominant Hand: Right    Extremity/Trunk Assessment   Upper Extremity Assessment Upper Extremity Assessment: Generalized weakness    Lower Extremity Assessment Lower Extremity Assessment: Generalized weakness       Communication   Communication: HOH  Cognition Arousal/Alertness: Awake/alert Behavior During Therapy: WFL for tasks assessed/performed;Agitated Overall Cognitive Status: Within Functional Limits for tasks assessed  General Comments: Pt able to communicate effectively, but is quite aggitated due to the pain he is having with his hemorrhoids.      General Comments General comments (skin integrity, edema, etc.): Unable to assess due to pt refusal.    Exercises Total Joint Exercises Ankle Circles/Pumps: AROM;Strengthening;Both;10 reps;Supine Quad Sets: AROM;Strengthening;Both;10 reps;Supine Gluteal Sets: AROM;Strengthening;Both;10 reps;Supine Hip ABduction/ADduction: AROM;Strengthening;Both;10 reps;Supine Straight Leg Raises: AROM;Strengthening;Both;10  reps;Supine   Assessment/Plan    PT Assessment Patient needs continued PT services  PT Problem List Decreased strength;Decreased activity tolerance;Decreased balance;Decreased mobility;Decreased safety awareness;Pain       PT Treatment Interventions DME instruction;Gait training;Functional mobility training;Therapeutic activities;Stair training;Therapeutic exercise    PT Goals (Current goals can be found in the Care Plan section)  Acute Rehab PT Goals Patient Stated Goal: To stop his pain PT Goal Formulation: With patient Time For Goal Achievement: 04/18/21 Potential to Achieve Goals: Good    Frequency Min 2X/week   Barriers to discharge Decreased caregiver support      Co-evaluation               AM-PAC PT "6 Clicks" Mobility  Outcome Measure Help needed turning from your back to your side while in a flat bed without using bedrails?: A Little Help needed moving from lying on your back to sitting on the side of a flat bed without using bedrails?: A Little Help needed moving to and from a bed to a chair (including a wheelchair)?: A Little Help needed standing up from a chair using your arms (e.g., wheelchair or bedside chair)?: A Lot Help needed to walk in hospital room?: A Lot Help needed climbing 3-5 steps with a railing? : A Lot 6 Click Score: 15    End of Session   Activity Tolerance: Patient tolerated treatment well Patient left: in bed;with call bell/phone within reach Nurse Communication: Mobility status;Patient requests pain meds PT Visit Diagnosis: Unsteadiness on feet (R26.81);Other abnormalities of gait and mobility (R26.89);Repeated falls (R29.6);Muscle weakness (generalized) (M62.81);History of falling (Z91.81);Difficulty in walking, not elsewhere classified (R26.2)    Time: [id number] PT Time Calculation (min) (ACUTE ONLY): 24 min   Charges:   PT Evaluation $PT Eval Low Complexity: 1 Low PT Treatments $Therapeutic Exercise: 8-22 mins         Gwenlyn Saran, PT, DPT 04/04/21, 1:58 PM   Christie Nottingham 04/04/2021, 1:54 PM

## 2021-04-04 NOTE — Progress Notes (Signed)
PROGRESS NOTE    [name]  ZOX:[id number] DOB: 05-01-1946 DOA: 04/02/2021 PCP: [name], MD   Brief Narrative: Taken from H&P. 75 year old male with past medical history of chronic kidney disease stage IIIb, diastolic congestive heart failure (Echo 11/2020 EF 50-55%), diabetes mellitus type 2, coronary artery disease status post CABG presented to [facility] emergency department with complaints of weakness. He was found to have hyponatremia with sodium of 115 with AKI.  Initially started on hypertonic saline, which was switched with normal saline today by nephrology.  Of note, patient was recently hospitalized at [facility] from 4/19 until 4/30.  Patient was admitted for acute kidney injury with ATN with volume overload.  Patient required a furosemide infusion for portion of the hospitalization and was eventually transitioned over to a combination of torsemide and metolazone at time of discharge.  Patient was eventually discharged on 4/30.  Subjective: Patient appears more alert and oriented today.  No chest pain or shortness of breath.  Assessment & Plan:   Active Problems:   Chronic systolic heart failure (HCC)   Essential hypertension   COPD (chronic obstructive pulmonary disease) with chronic bronchitis (HCC)   Acute renal failure superimposed on stage 3b chronic kidney disease (HCC)   Atrial fibrillation, chronic (HCC)   Weakness   Hyponatremia  Acute on chronic hyponatremia.  Baseline sodium of 130-135.  Most likely secondary to poor p.o. intake and the use of diuretics.  Initially requiring hypertonic saline.  Sodium improved to 127 today.  Nephrology is following-appreciate their help. -Hypertonic saline is being switched with normal saline. -Continue to monitor sodium. -Might need to stop diuretics on discharge and he can have a close follow-up with his provider.  AKI with CKD stage IIIb.  Baseline creatinine around 1.9.  Most likely prerenal.  Creatinine started improving with IV  fluid -Continue with IV fluid -Monitor renal function -Avoid nephrotoxins  Elevated troponin.  No chest pain but troponin was elevated and 600 and peaked at 4862 which can be due to NSTEMI.  He was started on heparin infusion, no plan for cardiac catheterization per cardiology at this time. -Continue heparin infusion -Repeat echocardiogram to check for regional wall motion abnormalities. -Cardiology consult-we will appreciate if put their proper recommendations.  Lactic acidosis.  Lactic acid on admission was 3.7>>1.3 -Continue with IV fluid  Hypertension.  Blood pressure currently within goal.  Home antihypertensives are being held due to softer blood pressure on admission. -Continue holding antihypertensives and monitor.  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 Give him 1 dose of Feraheme -Start him on iron supplement -We will get benefit with EPO -Monitor hemoglobin -Transfuse if below 7  Chronic HFpEF.  According to echocardiogram done in January 2022, EF was 50 to 55% and normal diastolic function.  Mildly decreased or within lower normal limit of EF. Patient appears dry. -Keep holding home dose of torsemide and metolazone-can discontinue on discharge with a close outpatient follow-up.  Chronic atrial fibrillation.  Rate controlled -Continue home dose of Eliquis  GERD. -Continue PPI  Type 2 diabetes mellitus with CKD stage IIIb and long-term use of insulin.  Uses 10 units of Lantus at bedtime along with SSI at home. -Continue home dose of Lantus -Continue with SSI  History of COPD with chronic bronchitis.  No concern of exacerbation at this time. -Continue to monitor  Objective: Vitals:   04/04/21 1100 04/04/21 1212 04/04/21 1300 04/04/21 1500  BP: 140/79  (!) 140/56 139/63  Pulse: 70  (!)  50 63  Resp: (!) 23  12 14   Temp:  98.6 F (37 C)    TempSrc:  Oral    SpO2: 95%  100% 100%  Weight:      Height:       No intake or output  data in the 24 hours ending 04/04/21 1559 Filed Weights   04/02/21 1538  Weight: 65.8 kg    Examination:  General.  Well-developed elderly man, in no acute distress. Pulmonary.  Lungs clear bilaterally, normal respiratory effort. CV.  Regular rate and rhythm, no JVD, rub or murmur. Abdomen.  Soft, nontender, nondistended, BS positive. CNS.  Alert and oriented x3.  No focal neurologic deficit. Extremities.  No edema, no cyanosis, pulses intact and symmetrical. Psychiatry.  Judgment and insight appears normal.  DVT prophylaxis: Eliquis Code Status: Full Family Communication: Wife was updated on phone Disposition Plan:  Status is: Inpatient  Remains inpatient appropriate because:Inpatient level of care appropriate due to severity of illness   Dispo: The patient is from: Home              Anticipated d/c is to: Home              Patient currently is not medically stable to d/c.   Difficult to place patient No                Level of care: Progressive Cardiac  All the records are reviewed and case discussed with Care Management/Social Worker. Management plans discussed with the patient, nursing and they are in agreement.  Consultants:   Nephrology  Procedures:  Antimicrobials:   Data Reviewed: I have personally reviewed following labs and imaging studies  CBC: Recent Labs  Lab 04/02/21 1550 04/03/21 0357 04/04/21 0457  WBC 17.5* 9.6 13.7*  NEUTROABS  --  7.6  --   HGB 7.3* 7.2* 7.9*  HCT 23.1* 22.8* 25.0*  MCV 73.6* 74.3* 75.5*  PLT 273 259 191   Basic Metabolic Panel: Recent Labs  Lab 04/02/21 1550 04/02/21 1832 04/03/21 0140 04/03/21 0357 04/03/21 1749 04/04/21 0457  NA 115* 116* 120* 122* 123* 127*  K 5.0  --   --  4.5  --  4.3  CL 73*  --   --  83*  --  91*  CO2 20*  --   --  26  --  22  GLUCOSE 192*  --   --  111*  --  136*  BUN 114*  --   --  109*  --  93*  CREATININE 5.48*  --   --  4.69*  --  3.37*  CALCIUM 8.5*  --   --  8.1*  --  8.1*  MG   --   --   --  3.4*  --   --    GFR: Estimated Creatinine Clearance: 17.1 mL/min (A) (by C-G formula based on SCr of 3.37 mg/dL (H)). Liver Function Tests: Recent Labs  Lab 04/02/21 1639 04/03/21 0357  AST 44* 51*  ALT 14 15  ALKPHOS 109 101  BILITOT 0.8 0.7  PROT 6.5 6.4*  ALBUMIN 3.5 3.4*   No results for input(s): LIPASE, AMYLASE in the last 168 hours. Recent Labs  Lab 04/02/21 1639  AMMONIA <9*   Coagulation Profile: Recent Labs  Lab 04/02/21 1639  INR 1.3*   Cardiac Enzymes: No results for input(s): CKTOTAL, CKMB, CKMBINDEX, TROPONINI in the last 168 hours. BNP (last 3 results) No results for input(s): PROBNP in the last  8760 hours. HbA1C: Recent Labs    04/03/21 0357  HGBA1C 6.8*   CBG: Recent Labs  Lab 04/03/21 1629 04/03/21 2147 04/04/21 0732 04/04/21 1111  GLUCAP 133* 160* 127* 237*   Lipid Profile: No results for input(s): CHOL, HDL, LDLCALC, TRIG, CHOLHDL, LDLDIRECT in the last 72 hours. Thyroid Function Tests: Recent Labs    04/02/21 1639  TSH 5.348*   Anemia Panel: Recent Labs    04/03/21 0357  TIBC 455*  IRON 17*   Sepsis Labs: Recent Labs  Lab 04/02/21 1639 04/03/21 1749 04/03/21 2040  LATICACIDVEN 3.7* 1.3 1.3    Recent Results (from the past 240 hour(s))  Resp Panel by RT-PCR (Flu A&B, Covid) Nasopharyngeal Swab     Status: None   Collection Time: 04/02/21  4:39 PM   Specimen: Nasopharyngeal Swab; Nasopharyngeal(NP) swabs in vial transport medium  Result Value Ref Range Status   SARS Coronavirus 2 by RT PCR NEGATIVE NEGATIVE Final    Comment: (NOTE) SARS-CoV-2 target nucleic acids are NOT DETECTED.  The SARS-CoV-2 RNA is generally detectable in upper respiratory specimens during the acute phase of infection. The lowest concentration of SARS-CoV-2 viral copies this assay can detect is 138 copies/mL. A negative result does not preclude SARS-Cov-2 infection and should not be used as the sole basis for treatment or other  patient management decisions. A negative result may occur with  improper specimen collection/handling, submission of specimen other than nasopharyngeal swab, presence of viral mutation(s) within the areas targeted by this assay, and inadequate number of viral copies(<138 copies/mL). A negative result must be combined with clinical observations, patient history, and epidemiological information. The expected result is Negative.  Fact Sheet for Patients:  EntrepreneurPulse.com.au  Fact Sheet for Healthcare Providers:  IncredibleEmployment.be  This test is no t yet approved or cleared by the [place] FDA and  has been authorized for detection and/or diagnosis of SARS-CoV-2 by FDA under an Emergency Use Authorization (EUA). This EUA will remain  in effect (meaning this test can be used) for the duration of the COVID-19 declaration under Section 564(b)(1) of the Act, 21 U.S.C.section 360bbb-3(b)(1), unless the authorization is terminated  or revoked sooner.       Influenza A by PCR NEGATIVE NEGATIVE Final   Influenza B by PCR NEGATIVE NEGATIVE Final    Comment: (NOTE) The Xpert Xpress SARS-CoV-2/FLU/RSV plus assay is intended as an aid in the diagnosis of influenza from Nasopharyngeal swab specimens and should not be used as a sole basis for treatment. Nasal washings and aspirates are unacceptable for Xpert Xpress SARS-CoV-2/FLU/RSV testing.  Fact Sheet for Patients: EntrepreneurPulse.com.au  Fact Sheet for Healthcare Providers: IncredibleEmployment.be  This test is not yet approved or cleared by the [place] FDA and has been authorized for detection and/or diagnosis of SARS-CoV-2 by FDA under an Emergency Use Authorization (EUA). This EUA will remain in effect (meaning this test can be used) for the duration of the COVID-19 declaration under Section 564(b)(1) of the Act, 21 U.S.C. section  360bbb-3(b)(1), unless the authorization is terminated or revoked.  Performed at [facility], [address] [place], [place] [address]   Blood culture (single)     Status: None (Preliminary result)   Collection Time: 04/02/21  4:39 PM   Specimen: BLOOD  Result Value Ref Range Status   Specimen Description BLOOD LEFT ANTECUBITAL  Final   Special Requests BOTTLES DRAWN AEROBIC AND ANAEROBIC LOW VOLUME  Final   Culture   Final    NO  GROWTH < 24 HOURS Performed at [facility], [address] [place], [place] [address]    Report Status PENDING  Incomplete     Radiology Studies: DG Chest 1 View  Result Date: 04/02/2021 CLINICAL DATA:  Confusion, weakness and elevated white blood cell count. EXAM: CHEST  1 VIEW COMPARISON:  Single-view of the chest 02/21/2021 and 03/01/2021. CT chest 02/02/2021. FINDINGS: The lungs are emphysematous. There is left basilar atelectasis. The right lung is clear. Cardiomegaly. The patient is status post CABG. Aortic atherosclerosis. No pneumothorax or pleural fluid. IMPRESSION: No acute disease. Left basilar atelectasis. Cardiomegaly. Aortic Atherosclerosis (ICD10-I70.0) and Emphysema (ICD10-J43.9). Electronically Signed   By: Inge Rise M.D.   On: 04/02/2021 17:04   CT Head Wo Contrast  Result Date: 04/02/2021 CLINICAL DATA:  Generalized weakness and altered mental status EXAM: CT HEAD WITHOUT CONTRAST TECHNIQUE: Contiguous axial images were obtained from the base of the skull through the vertex without intravenous contrast. COMPARISON:  02/19/2021 FINDINGS: Brain: No evidence of acute infarction, hemorrhage, hydrocephalus, extra-axial collection or mass lesion/mass effect. Mild atrophic and chronic white matter ischemic changes are noted. Vascular: No hyperdense vessel or unexpected calcification. Skull: Normal. Negative for fracture or focal lesion. Sinuses/Orbits: No acute finding. Other: None. IMPRESSION: Chronic atrophic and  ischemic changes without acute abnormality. Electronically Signed   By: Inez Catalina M.D.   On: 04/02/2021 16:46    Scheduled Meds: . ferrous sulfate  325 mg Oral BID WC  . hydrocortisone  25 mg Rectal BID  . insulin aspart  0-5 Units Subcutaneous QHS  . insulin aspart  0-9 Units Subcutaneous TID WC  . insulin glargine  10 Units Subcutaneous Q2200  . pantoprazole  40 mg Oral Daily  . simvastatin  20 mg Oral q AM  . tiotropium  18 mcg Inhalation Daily  . traZODone  50 mg Oral QHS   Continuous Infusions: . sodium chloride 75 mL/hr at 04/04/21 1401  . heparin 750 Units/hr (04/04/21 1023)     LOS: 2 days   Time spent: 35 minutes. More than 50% of the time was spent in counseling/coordination of care  Lorella Nimrod, MD [facility]  If 7PM-7AM, please contact night-coverage [email or web address]  04/04/2021, 3:59 PM   This record has been created using Systems analyst. Errors have been sought and corrected,but may not always be located. Such creation errors do not reflect on the standard of care.

## 2021-04-04 NOTE — Progress Notes (Signed)
Brief cardiology consult note Imp NSTEMI  CAD Hx  CABG  CRI IV AFIB  CHF Hyperlipidemia HTN  PVD CVA  DM . Plan Tele Hold Eliquis  Heparin IV Hydrate  Echo DM treatment  Nephrology input Defer cath

## 2021-04-04 NOTE — Consult Note (Signed)
ANTICOAGULATION CONSULT NOTE - Consult  Pharmacy Consult for Heparin gtt Indication: chest pain/ACS  Allergies  Allergen Reactions  . Lactose Intolerance (Gi)     Patient Measurements: Height: 5\' 6"  (167.6 cm) Weight: 65.8 kg (145 lb) IBW/kg (Calculated) : 63.8 Heparin Dosing Weight: 65.8kg  Vital Signs: BP: 124/68 (06/02 0500) Pulse Rate: 66 (06/02 0500)  Labs: Recent Labs    04/02/21 1550 04/02/21 1639 04/03/21 0357 04/03/21 1749 04/03/21 1922 04/04/21 0026 04/04/21 0457  HGB 7.3*  --  7.2*  --   --   --  7.9*  HCT 23.1*  --  22.8*  --   --   --  25.0*  PLT 273  --  259  --   --   --  239  LABPROT  --  16.2*  --   --   --   --   --   INR  --  1.3*  --   --   --   --   --   CREATININE 5.48*  --  4.69*  --   --   --  3.37*  TROPONINIHS  --  601*  --    < > 4,461* 2,971* 2,393*   < > = values in this interval not displayed.    Estimated Creatinine Clearance: 17.1 mL/min (A) (by C-G formula based on SCr of 3.37 mg/dL (H)).   Medications:  apixaban 2.5 BID (last dose 6/01 @2153 )  Heparin Dosing Weight: 65.8kg  Assessment: 75yo male w/ h/o CAD (s/p CABG), Afib (on eliquis 2.5), CHF, HLD, HTN, PVD, CVA, & DM presenting with NSTEMI. Troponins [phone]. Pt taking Eliquis PTA for Afib, last dose 6/01 @2153 . Pharmacy consulted for transition of eliquis to hep gtt.  Date Time aPTT/HL Rate/Comment       Baseline Labs: aPTT - sent/pending Anti-Xa - sent/pending Hgb - 7.3>7.2>7.9 (prior to heparin start) Plts - [phone] (prior to heparin start)  Goal of Therapy:  Heparin level 0.3-0.7 units/ml aPTT 66-102 seconds Monitor platelets by anticoagulation protocol: Yes   Plan:  Last dose of Eliquis 2.5mg  6/01 @2159 ; will start with reduced Hep bolus and conservative rate at next dosing interval 6/02 @1000 .  Give reduced 3000 units bolus x 1 Start heparin infusion at 750 units/hr Check baseline aPTT/Anti-Xa level in 8 hours and daily while on heparin until  labs correlate. Titrate by aPTT alone until lab correlation noted, then titrate by anti-xa alone. Continue to monitor H&H and platelets  Lorna Dibble 04/04/2021,8:07 AM

## 2021-04-04 NOTE — Consult Note (Signed)
CARDIOLOGY CONSULT NOTE               Patient ID: [name] MRN: [id number] DOB/AGE: 03/02/1946 75 y.o.  Admit date: 04/02/2021 Referring Physician Inda Merlin MD hospitalist Primary Physician [name] MD primary Primary Cardiologist Dr. Ubaldo Glassing Reason for Consultation non-STEMI ACS  HPI: Patient is a 75 year old male multiple medical problems acute on chronic renal sufficiency diastolic heart failure diabetes type 2 multivessel coronary disease history of coronary bypass surgery previous myocardial infarction atrial fibrillation hypertension hyperlipidemia presented hyponatremia and acute on chronic renal insufficiency complains of some shortness of breath and chest discomfort poor appetite weakness fatigue confusion.  Patient then presented for further evaluation with elevated troponin and atrial fibrillation.  Patient is a poor historian confusion fatigue weakness fatigue followed by Dr. Ubaldo Glassing states to be pain-free at this point weakness has been relatively persistent  Review of systems complete and found to be negative unless listed above     Past Medical History:  Diagnosis Date  . Arrhythmia    atrial fibrillation  . Atrial fibrillation ([place])   . Basal cell carcinoma 05/2019   right nasal ala, Tx: EDC  . Basal cell carcinoma 04/12/2019   Left posterior ear. Nodular pattern, excoriated.   . CHF (congestive heart failure) ([place])   . COPD (chronic obstructive pulmonary disease) ([place])   . Coronary artery disease   . Diabetes mellitus without complication ([place])   . GERD (gastroesophageal reflux disease)   . HLD (hyperlipidemia)   . Hypertension   . Peripheral vascular disease ([place])   . Pneumonia   . Prostate cancer ([place])   . Squamous cell carcinoma of skin 08/10/2018   Left upper arm above elbow. [facility].  [facility] Kitchen Squamous cell carcinoma of skin 09/07/2018   Right forearm, below elbow. [facility]. [facility] 01/10/2019.  Marland Kitchen Stroke [facility])     Past Surgical History:  Procedure  Laterality Date  . COLONOSCOPY    . CORONARY ARTERY BYPASS GRAFT  2006  . EYE SURGERY    . GOLD SEED IMPLANT N/A 12/30/2016   Procedure: GOLD SEED IMPLANT  x3;  Surgeon: Hollice Espy, MD;  Location: [facility] ORS;  Service: Urology;  Laterality: N/A;  . HEMORRHOID SURGERY N/A 10/02/2016   Procedure: PROCTOSCOPY AND CONTROL OF RECTAL BLEEDING;  Surgeon: Excell Seltzer, MD;  Location: [facility] ORS;  Service: General;  Laterality: N/A;  . PROSTATE BIOPSY    . TONSILLECTOMY      (Not in a hospital admission)  Social History   Socioeconomic History  . Marital status: Married    Spouse name: Not on file  . Number of children: Not on file  . Years of education: Not on file  . Highest education level: Not on file  Occupational History  . Not on file  Tobacco Use  . Smoking status: Former Smoker    Packs/day: 1.00    Years: 54.00    Pack years: 54.00    Types: Cigarettes  . Smokeless tobacco: Never Used  Vaping Use  . Vaping Use: Never used  Substance and Sexual Activity  . Alcohol use: No    Alcohol/week: 0.0 standard drinks  . Drug use: No  . Sexual activity: Yes    Birth control/protection: None    Comment: Married  Other Topics Concern  . Not on file  Social History Narrative  . Not on file   Social Determinants of Health   Financial Resource Strain: Not on file  Food Insecurity: Not on file  Transportation Needs: Not on file  Physical Activity: Not on file  Stress: Not on file  Social Connections: Not on file  Intimate Partner Violence: Not on file    Family History  Problem Relation Age of Onset  . Lung cancer Mother   . Heart attack Father   . Hypertension Other   . Cancer Maternal Grandfather        prostate  . Cancer Paternal Grandmother        breast      Review of systems complete and found to be negative unless listed above      PHYSICAL EXAM  General: Well developed, well nourished, in no acute distress HEENT:  Normocephalic and atramatic Neck:   No JVD.  Lungs: Clear bilaterally to auscultation and percussion. Heart: Irregular. Normal S1 and S2 without gallops or murmurs.  Abdomen: Bowel sounds are positive, abdomen soft and non-tender  Msk:  Back normal, normal gait. Normal strength and tone for age. Extremities: No clubbing, cyanosis or edema.   Neuro: Alert and oriented X 3.  Lethargic sleepy Psych:  Good affect, responds appropriately  Labs:   Lab Results  Component Value Date   WBC 13.7 (H) 04/04/2021   HGB 7.9 (L) 04/04/2021   HCT 25.0 (L) 04/04/2021   MCV 75.5 (L) 04/04/2021   PLT 239 04/04/2021    Recent Labs  Lab 04/03/21 0357 04/03/21 1749 04/04/21 0457  NA 122*   < > 127*  K 4.5  --  4.3  CL 83*  --  91*  CO2 26  --  22  BUN 109*  --  93*  CREATININE 4.69*  --  3.37*  CALCIUM 8.1*  --  8.1*  PROT 6.4*  --   --   BILITOT 0.7  --   --   ALKPHOS 101  --   --   ALT 15  --   --   AST 51*  --   --   GLUCOSE 111*  --  136*   < > = values in this interval not displayed.   Lab Results  Component Value Date   CKTOTAL 34 (L) 02/19/2021   TROPONINI 0.03 (HH) 06/27/2018   No results found for: CHOL No results found for: HDL No results found for: LDLCALC No results found for: TRIG No results found for: CHOLHDL No results found for: LDLDIRECT    Radiology: DG Chest 1 View  Result Date: 04/02/2021 CLINICAL DATA:  Confusion, weakness and elevated white blood cell count. EXAM: CHEST  1 VIEW COMPARISON:  Single-view of the chest 02/21/2021 and 03/01/2021. CT chest 02/02/2021. FINDINGS: The lungs are emphysematous. There is left basilar atelectasis. The right lung is clear. Cardiomegaly. The patient is status post CABG. Aortic atherosclerosis. No pneumothorax or pleural fluid. IMPRESSION: No acute disease. Left basilar atelectasis. Cardiomegaly. Aortic Atherosclerosis (ICD10-I70.0) and Emphysema (ICD10-J43.9). Electronically Signed   By: Inge Rise M.D.   On: 04/02/2021 17:04   CT Head Wo  Contrast  Result Date: 04/02/2021 CLINICAL DATA:  Generalized weakness and altered mental status EXAM: CT HEAD WITHOUT CONTRAST TECHNIQUE: Contiguous axial images were obtained from the base of the skull through the vertex without intravenous contrast. COMPARISON:  02/19/2021 FINDINGS: Brain: No evidence of acute infarction, hemorrhage, hydrocephalus, extra-axial collection or mass lesion/mass effect. Mild atrophic and chronic white matter ischemic changes are noted. Vascular: No hyperdense vessel or unexpected calcification. Skull: Normal. Negative for fracture or focal lesion. Sinuses/Orbits: No acute finding. Other: None. IMPRESSION: Chronic atrophic  and ischemic changes without acute abnormality. Electronically Signed   By: Inez Catalina M.D.   On: 04/02/2021 16:46    EKG: Atrial fibrillation nonspecific ST-T wave changes rate of around 75  ASSESSMENT AND PLAN:  Non-STEMI Coronary artery disease multivessel History of coronary bypass surgery Acute on chronic renal insufficiency Diabetes insulin-dependent COPD Hyponatremia Anemia Generalized weakness and fatigue Peripheral vascular disease Diastolic congestive heart failure Atrial fibrillation . Plan Agreed admit to telemetry follow-up EKGs troponin Hold Eliquis for now and switch to IV heparin Follow-up troponins in the face of probable non-STEMI Recommend medical therapy for now unlikely we will proceed with invasive strategy likely cath Continue diabetes management and control Agree with hypertension management Continue statin therapy for hyperlipidemia Continue rate control for atrial fibrillation switch from Eliquis to heparin temporarily Correct electrolytes including hyponatremia Nephrology input for acute on chronic renal insufficiency Recommend conservative cardiac input at this point  Signed: Yolonda Kida MD 04/04/2021, 7:43 AM

## 2021-04-04 NOTE — Progress Notes (Signed)
[facility]  ROUNDING NOTE   Subjective:   [name] is admitted to [facility] on 04/02/2021 for Hyponatremia [E87.1]  Hypertonic saline on admission.  Patient more awake and alert today. Continues to normal saline infusion. Na 127.   Objective:  Vital signs in last 24 hours:  Temp:  [98.6 F (37 C)] 98.6 F (37 C) (06/02 1212) Pulse Rate:  [39-75] 70 (06/02 1100) Resp:  [12-23] 23 (06/02 1100) BP: (119-143)/(41-79) 140/79 (06/02 1100) SpO2:  [87 %-100 %] 95 % (06/02 1100)  Weight change:  Filed Weights   04/02/21 1538  Weight: 65.8 kg    Intake/Output: I/O last 3 completed shifts: In: -  Out: 600 [Urine:600]   Intake/Output this shift:  No intake/output data recorded.  Physical Exam: General: NAD  Head: Normocephalic, atraumatic. Dry oral mucosal membranes  Eyes: Anicteric, PERRL  Neck: Supple, trachea midline  Lungs:  Clear to auscultation  Heart: Regular rate and rhythm  Abdomen:  Soft, nontender,   Extremities: no peripheral edema.  Neurologic: altered  Skin: No lesions       Basic Metabolic Panel: Recent Labs  Lab 04/02/21 1550 04/02/21 1832 04/03/21 0140 04/03/21 0357 04/03/21 1749 04/04/21 0457  NA 115* 116* 120* 122* 123* 127*  K 5.0  --   --  4.5  --  4.3  CL 73*  --   --  83*  --  91*  CO2 20*  --   --  26  --  22  GLUCOSE 192*  --   --  111*  --  136*  BUN 114*  --   --  109*  --  93*  CREATININE 5.48*  --   --  4.69*  --  3.37*  CALCIUM 8.5*  --   --  8.1*  --  8.1*  MG  --   --   --  3.4*  --   --     Liver Function Tests: Recent Labs  Lab 04/02/21 1639 04/03/21 0357  AST 44* 51*  ALT 14 15  ALKPHOS 109 101  BILITOT 0.8 0.7  PROT 6.5 6.4*  ALBUMIN 3.5 3.4*   No results for input(s): LIPASE, AMYLASE in the last 168 hours. Recent Labs  Lab 04/02/21 1639  AMMONIA <9*    CBC: Recent Labs  Lab 04/02/21 1550 04/03/21 0357 04/04/21 0457  WBC 17.5* 9.6 13.7*  NEUTROABS  --  7.6  --   HGB 7.3* 7.2* 7.9*   HCT 23.1* 22.8* 25.0*  MCV 73.6* 74.3* 75.5*  PLT 273 259 239    Cardiac Enzymes: No results for input(s): CKTOTAL, CKMB, CKMBINDEX, TROPONINI in the last 168 hours.  BNP: Invalid input(s): POCBNP  CBG: Recent Labs  Lab 04/03/21 1629 04/03/21 2147 04/04/21 0732 04/04/21 1111  GLUCAP 133* 160* 127* 237*    Microbiology: Results for orders placed or performed during the hospital encounter of 04/02/21  Resp Panel by RT-PCR (Flu A&B, Covid) Nasopharyngeal Swab     Status: None   Collection Time: 04/02/21  4:39 PM   Specimen: Nasopharyngeal Swab; Nasopharyngeal(NP) swabs in vial transport medium  Result Value Ref Range Status   SARS Coronavirus 2 by RT PCR NEGATIVE NEGATIVE Final    Comment: (NOTE) SARS-CoV-2 target nucleic acids are NOT DETECTED.  The SARS-CoV-2 RNA is generally detectable in upper respiratory specimens during the acute phase of infection. The lowest concentration of SARS-CoV-2 viral copies this assay can detect is 138 copies/mL. A negative result does not  preclude SARS-Cov-2 infection and should not be used as the sole basis for treatment or other patient management decisions. A negative result may occur with  improper specimen collection/handling, submission of specimen other than nasopharyngeal swab, presence of viral mutation(s) within the areas targeted by this assay, and inadequate number of viral copies(<138 copies/mL). A negative result must be combined with clinical observations, patient history, and epidemiological information. The expected result is Negative.  Fact Sheet for Patients:  EntrepreneurPulse.com.au  Fact Sheet for Healthcare Providers:  IncredibleEmployment.be  This test is no t yet approved or cleared by the [place] FDA and  has been authorized for detection and/or diagnosis of SARS-CoV-2 by FDA under an Emergency Use Authorization (EUA). This EUA will remain  in effect (meaning this  test can be used) for the duration of the COVID-19 declaration under Section 564(b)(1) of the Act, 21 U.S.C.section 360bbb-3(b)(1), unless the authorization is terminated  or revoked sooner.       Influenza A by PCR NEGATIVE NEGATIVE Final   Influenza B by PCR NEGATIVE NEGATIVE Final    Comment: (NOTE) The Xpert Xpress SARS-CoV-2/FLU/RSV plus assay is intended as an aid in the diagnosis of influenza from Nasopharyngeal swab specimens and should not be used as a sole basis for treatment. Nasal washings and aspirates are unacceptable for Xpert Xpress SARS-CoV-2/FLU/RSV testing.  Fact Sheet for Patients: EntrepreneurPulse.com.au  Fact Sheet for Healthcare Providers: IncredibleEmployment.be  This test is not yet approved or cleared by the [place] FDA and has been authorized for detection and/or diagnosis of SARS-CoV-2 by FDA under an Emergency Use Authorization (EUA). This EUA will remain in effect (meaning this test can be used) for the duration of the COVID-19 declaration under Section 564(b)(1) of the Act, 21 U.S.C. section 360bbb-3(b)(1), unless the authorization is terminated or revoked.  Performed at [facility], [address] [place], [place] [address]   Blood culture (single)     Status: None (Preliminary result)   Collection Time: 04/02/21  4:39 PM   Specimen: BLOOD  Result Value Ref Range Status   Specimen Description BLOOD LEFT ANTECUBITAL  Final   Special Requests BOTTLES DRAWN AEROBIC AND ANAEROBIC LOW VOLUME  Final   Culture   Final    NO GROWTH < 24 HOURS Performed at [facility], [address], [place], [place] [address]    Report Status PENDING  Incomplete    Coagulation Studies: Recent Labs    04/02/21 1639  LABPROT 16.2*  INR 1.3*    Urinalysis: Recent Labs    04/02/21 1639  COLORURINE AMBER*  LABSPEC 1.015  PHURINE 5.0  GLUCOSEU NEGATIVE  HGBUR NEGATIVE  BILIRUBINUR  NEGATIVE  KETONESUR NEGATIVE  PROTEINUR NEGATIVE  NITRITE NEGATIVE  LEUKOCYTESUR NEGATIVE      Imaging: DG Chest 1 View  Result Date: 04/02/2021 CLINICAL DATA:  Confusion, weakness and elevated white blood cell count. EXAM: CHEST  1 VIEW COMPARISON:  Single-view of the chest 02/21/2021 and 03/01/2021. CT chest 02/02/2021. FINDINGS: The lungs are emphysematous. There is left basilar atelectasis. The right lung is clear. Cardiomegaly. The patient is status post CABG. Aortic atherosclerosis. No pneumothorax or pleural fluid. IMPRESSION: No acute disease. Left basilar atelectasis. Cardiomegaly. Aortic Atherosclerosis (ICD10-I70.0) and Emphysema (ICD10-J43.9). Electronically Signed   By: Inge Rise M.D.   On: 04/02/2021 17:04   CT Head Wo Contrast  Result Date: 04/02/2021 CLINICAL DATA:  Generalized weakness and altered mental status EXAM: CT HEAD WITHOUT CONTRAST TECHNIQUE: Contiguous axial images were obtained  from the base of the skull through the vertex without intravenous contrast. COMPARISON:  02/19/2021 FINDINGS: Brain: No evidence of acute infarction, hemorrhage, hydrocephalus, extra-axial collection or mass lesion/mass effect. Mild atrophic and chronic white matter ischemic changes are noted. Vascular: No hyperdense vessel or unexpected calcification. Skull: Normal. Negative for fracture or focal lesion. Sinuses/Orbits: No acute finding. Other: None. IMPRESSION: Chronic atrophic and ischemic changes without acute abnormality. Electronically Signed   By: Inez Catalina M.D.   On: 04/02/2021 16:46     Medications:   . sodium chloride 75 mL/hr at 04/04/21 0527  . heparin 750 Units/hr (04/04/21 1023)   . ferrous sulfate  325 mg Oral BID WC  . hydrocortisone  25 mg Rectal BID  . insulin aspart  0-5 Units Subcutaneous QHS  . insulin aspart  0-9 Units Subcutaneous TID WC  . insulin glargine  10 Units Subcutaneous Q2200  . pantoprazole  40 mg Oral Daily  . simvastatin  20 mg Oral q AM   . tiotropium  18 mcg Inhalation Daily  . traZODone  50 mg Oral QHS   acetaminophen **OR** acetaminophen, ipratropium-albuterol, ondansetron **OR** ondansetron (ZOFRAN) IV, polyethylene glycol  Assessment/ Plan:  [name] is a 75 y.o. white male with CVA, prostate cancer, peripheral vascular disease, hypertension, hyperlipidemia, GERD, diabetes mellitus type II, COPD, congestive heart failure, atrial fibrillation who is admitted to [facility] on 04/02/2021 for Hyponatremia [E87.1]  1. Hyponatremia: acute on chronic. Baseline of 130-135.  Poor PO intake and continued use of metalozone.  Emergent hypertonic saline administered on admission Remains hypovolumic - Continue 0.9 saline infusion.   2. Acute kidney injury: on chronic kidney disease stage IIIB with history of proteinuria. Baseline creatinine of 1.9, GFR of 35 on 12/14/2020.  AKI consistent with prerenal azotemia No IV contrast exposure.  Chronic Kidney Disease secondary to diabetic nephropathy and hypertension.  - Continue IV fluids.   3. Hypertension: hypovolumic. Holding home medications. Echocardiogram from 11/15/2020 with normal diastolic and systolic function.  Holding home regimen of metolazone and torsemide.   4.  Anemia with renal failure: hemoglobin 7.9, microcytic. Consistent with iron deficiency    LOS: 2 [name] 6/2/20221:04 PM

## 2021-04-04 NOTE — Plan of Care (Signed)
Patient transferring to room 238/ floor 2A.  Report given and Koren Bound, RN to continue care once arrives on floor.

## 2021-04-05 LAB — CBC
HCT: 25.7 % — ABNORMAL LOW (ref 39.0–52.0)
Hemoglobin: 7.9 g/dL — ABNORMAL LOW (ref 13.0–17.0)
MCH: 23.7 pg — ABNORMAL LOW (ref 26.0–34.0)
MCHC: 30.7 g/dL (ref 30.0–36.0)
MCV: 76.9 fL — ABNORMAL LOW (ref 80.0–100.0)
Platelets: 280 10*3/uL (ref 150–400)
RBC: 3.34 MIL/uL — ABNORMAL LOW (ref 4.22–5.81)
RDW: 18.9 % — ABNORMAL HIGH (ref 11.5–15.5)
WBC: 10.9 10*3/uL — ABNORMAL HIGH (ref 4.0–10.5)
nRBC: 0 % (ref 0.0–0.2)

## 2021-04-05 LAB — HEPARIN LEVEL (UNFRACTIONATED): Heparin Unfractionated: >1.1 IU/mL — ABNORMAL HIGH (ref 0.30–0.70)

## 2021-04-05 LAB — BASIC METABOLIC PANEL
Anion gap: 11 (ref 5–15)
BUN: 85 mg/dL — ABNORMAL HIGH (ref 8–23)
CO2: 24 mmol/L (ref 22–32)
Calcium: 8.3 mg/dL — ABNORMAL LOW (ref 8.9–10.3)
Chloride: 96 mmol/L — ABNORMAL LOW (ref 98–111)
Creatinine, Ser: 2.37 mg/dL — ABNORMAL HIGH (ref 0.61–1.24)
GFR, Estimated: 28 mL/min — ABNORMAL LOW (ref >60)
Glucose, Bld: 134 mg/dL — ABNORMAL HIGH (ref 70–99)
Potassium: 3.9 mmol/L (ref 3.5–5.1)
Sodium: 131 mmol/L — ABNORMAL LOW (ref 135–145)

## 2021-04-05 LAB — APTT: aPTT: 52 seconds — ABNORMAL HIGH (ref 24–36)

## 2021-04-05 LAB — GLUCOSE, CAPILLARY
Glucose-Capillary: 123 mg/dL — ABNORMAL HIGH (ref 70–99)
Glucose-Capillary: 142 mg/dL — ABNORMAL HIGH (ref 70–99)
Glucose-Capillary: 232 mg/dL — ABNORMAL HIGH (ref 70–99)
Glucose-Capillary: 224 mg/dL — ABNORMAL HIGH (ref 70–99)

## 2021-04-05 MED ORDER — ENSURE ENLIVE PO LIQD
237.0000 mL | Freq: Three times a day (TID) | ORAL | Status: DC
  Administered 2021-04-05 – 2021-04-07 (×7): 237 mL via ORAL

## 2021-04-05 MED ORDER — HEPARIN BOLUS VIA INFUSION
2000.0000 [IU] | INTRAVENOUS | Status: AC
  Administered 2021-04-05: 2000 [IU] via INTRAVENOUS
  Filled 2021-04-05: qty 2000

## 2021-04-05 MED ORDER — CLOPIDOGREL BISULFATE 75 MG PO TABS
75.0000 mg | ORAL_TABLET | Freq: Every day | ORAL | Status: DC
  Administered 2021-04-05 – 2021-04-08 (×4): 75 mg via ORAL
  Filled 2021-04-05 (×4): qty 1

## 2021-04-05 MED ORDER — ADULT MULTIVITAMIN W/MINERALS CH
1.0000 | ORAL_TABLET | Freq: Every day | ORAL | Status: DC
  Administered 2021-04-05 – 2021-04-08 (×4): 1 via ORAL
  Filled 2021-04-05 (×4): qty 1

## 2021-04-05 MED ORDER — APIXABAN 2.5 MG PO TABS
2.5000 mg | ORAL_TABLET | Freq: Two times a day (BID) | ORAL | Status: DC
  Administered 2021-04-05 – 2021-04-08 (×7): 2.5 mg via ORAL
  Filled 2021-04-05 (×7): qty 1

## 2021-04-05 MED ORDER — BOOST / RESOURCE BREEZE PO LIQD CUSTOM
1.0000 | Freq: Three times a day (TID) | ORAL | Status: DC
  Administered 2021-04-05 – 2021-04-08 (×5): 1 via ORAL

## 2021-04-05 MED ORDER — LORAZEPAM 0.5 MG PO TABS
0.5000 mg | ORAL_TABLET | Freq: Four times a day (QID) | ORAL | Status: DC | PRN
  Administered 2021-04-05 – 2021-04-07 (×5): 0.5 mg via ORAL
  Filled 2021-04-05 (×5): qty 1

## 2021-04-05 NOTE — Care Management Important Message (Signed)
Important Message  Patient Details  Name: [name] MRN: [id number] Date of Birth: [birth year]   Medicare Important Message Given:  Yes     Dannette Barbara 04/05/2021, 12:54 PM

## 2021-04-05 NOTE — Consult Note (Signed)
ANTICOAGULATION CONSULT NOTE - Consult  Pharmacy Consult for Heparin gtt Indication: chest pain/ACS  Allergies  Allergen Reactions  . Lactose Intolerance (Gi)     Patient Measurements: Height: 5\' 6"  (167.6 cm) Weight: 70.3 kg (154 lb 15.7 oz) IBW/kg (Calculated) : 63.8 Heparin Dosing Weight: 65.8kg  Vital Signs: Temp: 98.2 F (36.8 C) (06/03 0446) Temp Source: Oral (06/03 0446) BP: 130/54 (06/03 0446) Pulse Rate: 57 (06/03 0446)  Labs: Recent Labs    04/02/21 1639 04/03/21 0357 04/03/21 1749 04/04/21 0026 04/04/21 0457 04/04/21 0843 04/04/21 1217 04/04/21 1948 04/05/21 0438  HGB  --  7.2*  --   --  7.9*  --   --   --  7.9*  HCT  --  22.8*  --   --  25.0*  --   --   --  25.7*  PLT  --  259  --   --  239  --   --   --  280  APTT  --   --   --   --   --  39*  --  43* 52*  LABPROT 16.2*  --   --   --   --   --   --   --   --   INR 1.3*  --   --   --   --   --   --   --   --   HEPARINUNFRC  --   --   --   --   --  >1.10*  --   --  >1.10*  CREATININE  --  4.69*  --   --  3.37*  --   --   --  2.37*  TROPONINIHS 601*  --    < > 2,971* 2,393*  --  1,845*  --   --    < > = values in this interval not displayed.    Estimated Creatinine Clearance: 24.3 mL/min (A) (by C-G formula based on SCr of 2.37 mg/dL (H)).   Medications:  apixaban 2.5 BID (last dose 6/01 @2153 )  Heparin Dosing Weight: 65.8kg  Assessment: 75yo male w/ h/o CAD (s/p CABG), Afib (on eliquis 2.5), CHF, HLD, HTN, PVD, CVA, & DM presenting with NSTEMI. Troponins [phone]. Pt taking Eliquis PTA for Afib, last dose 6/01 @2153 . Pharmacy consulted for transition of eliquis to hep gtt.  Date Time aPTT/HL Rate/Comment 6/2 1948 43sec  764ml/hr 6/3 0438 52sec, subtherapeutic, HL >1.1     Baseline Labs: aPTT - sent/pending Anti-Xa - sent/pending Hgb - 7.3>7.2>7.9 (prior to heparin start) Plts - [phone] (prior to heparin start)  Goal of Therapy:  Heparin level 0.3-0.7 units/ml aPTT 66-102  seconds Monitor platelets by anticoagulation protocol: Yes   Plan:  Note: Last dose of Eliquis 2.5mg  6/01 @2159 ;     Order 2000 units bolus x 1 Increase heparin infusion to 1100 units/hr Will recheck aPTT in 8 hours after rate change Titrate by aPTT alone until lab correlation noted, then titrate by anti-xa alone. HL and CBC daily while on heparin.  Renda Rolls, PharmD, [facility] 04/05/2021 5:46 AM

## 2021-04-05 NOTE — Progress Notes (Signed)
Initial Nutrition Assessment  DOCUMENTATION CODES:  Non-severe (moderate) malnutrition in context of chronic illness  INTERVENTION:  Add Ensure Enlive po TID, each supplement provides 350 kcal and 20 grams of protein.  Add Boost Breeze po TID, each supplement provides 250 kcal and 9 grams of protein.  Add MVI with minerals daily.  Encourage intake of meals and supplements.  NUTRITION DIAGNOSIS:  Moderate Malnutrition related to chronic illness as evidenced by mild fat depletion,moderate fat depletion,mild muscle depletion,moderate muscle depletion.  GOAL:  Patient will meet greater than or equal to 90% of their needs  MONITOR:  PO intake,Supplement acceptance,Labs,Weight trends,Skin,I & O's  REASON FOR ASSESSMENT:  Malnutrition Screening Tool    ASSESSMENT:  75 yo male with a PMH of CKD stage 3b, diastolic CHF (EF 46-96%), T2DM, CAD s/p CABG who presents with acute on chronic hyponatremia and AKI on CKD3b. Of note, patient was recently hospitalized at [facility] from 4/19 until 4/30 for acute kidney injury with ATN with volume overload.  Spoke with pt at bedside. Pt very confused and poor historian. Unable to gather too much relevant history at this time. Pt did remind RD that he is lactose intolerant and that he only likes orange flavored fruit items.  Per Epic, pt's weight has stayed consistently between 70-74 kg for the last 7 months. Weight from 03/25/21 is lower than the rest - this is questionable and may be an inaccurate weight.  On exam, pt has moderate depletions.  Given above information, pt is moderately malnourished in the chronic setting. Pt at risk for severe malnutrition as well.  Recommend adding Ensure Enlive TID and Boost Breeze TID to promote caloric and protein intake. Also recommend adding MVI with minerals daily.  Medications: reviewed; ferrous sulfate, SSI, mealtime aspart, lantus, Protonix, IVF @ 75 ml/hr  Labs: reviewed; Na 131, CBG 123-237 HbA1c: 6.8%  (04/2021)  NUTRITION - FOCUSED PHYSICAL EXAM: Flowsheet Row Most Recent Value  Orbital Region Moderate depletion  Upper Arm Region Moderate depletion  Thoracic and Lumbar Region Mild depletion  Buccal Region Moderate depletion  Temple Region Moderate depletion  Clavicle Bone Region Moderate depletion  Clavicle and Acromion Bone Region Moderate depletion  Scapular Bone Region Mild depletion  Dorsal Hand Moderate depletion  Patellar Region Moderate depletion  Anterior Thigh Region Moderate depletion  Posterior Calf Region Moderate depletion  Edema (RD Assessment) None  Hair Reviewed  Eyes Reviewed  Mouth Reviewed  Skin Reviewed  [Extremely dry, scabs all over both calves]  Nails Reviewed  [Yellowed]     Diet Order:   Diet Order            Diet heart healthy/carb modified Room service appropriate? Yes; Fluid consistency: Thin  Diet effective now                EDUCATION NEEDS:  Education needs have been addressed  Skin:  Skin Assessment: Skin Integrity Issues: Skin Integrity Issues:: Stage II,Other (Comment) Stage II: Medial sacral pressure injury Other: Non-pressure wounds on buttocks  Last BM:  04/04/21  Height:  Ht Readings from Last 1 Encounters:  04/04/21 5\' 6"  (1.676 m)   Weight:  Wt Readings from Last 1 Encounters:  04/04/21 70.3 kg   Ideal Body Weight:  64.5 kg  BMI:  Body mass index is 25.01 kg/m.  Estimated Nutritional Needs:  Kcal:  1900-2100 Protein:  90-105 grams Fluid:  >1.9 L  [name], RD, LDN Registered Dietitian After Hours/Weekend Pager # [phone] [place]

## 2021-04-05 NOTE — NC FL2 (Signed)
[place] LEVEL OF CARE SCREENING TOOL     IDENTIFICATION  Patient Name: [name] Birthdate: 22-Aug-1946 Sex: male Admission Date (Current Location): 04/02/2021  [place] and [place] Number:  Engineering geologist and Address:  [facility], [address], [place], [place] [address]      Provider Number: [id number]  Attending Physician Name and Address:  Lorella Nimrod, MD  Relative Name and Phone Number:  Riddik, Senna (Spouse)   [phone] [facility])    Current Level of Care: Hospital Recommended Level of Care: [place] Prior Approval Number:    Date Approved/Denied:   PASRR Number: [id number]  Discharge Plan:      Current Diagnoses: Patient Active Problem List   Diagnosis Date Noted  . Hyponatremia 04/02/2021  . Pressure injury of skin 02/20/2021  . AKI (acute kidney injury) ([place]) 02/19/2021  . Type 2 diabetes mellitus with hypoglycemia without coma ([place]) 02/19/2021  . Bradycardia 02/19/2021  . Weakness 02/19/2021  . Dental infection 02/19/2021  . Acute respiratory failure ([place]) 01/28/2021  . Acute renal failure superimposed on stage 3b chronic kidney disease ([place]) 01/28/2021  . Atrial fibrillation, chronic ([place]) 01/28/2021  . Acute CHF (congestive heart failure) ([place]) 11/15/2020  . Chronic diastolic CHF (congestive heart failure) ([place]) 10/24/2020  . Elevated troponin 10/24/2020  . CKD (chronic kidney disease), stage IV ([place]) 10/24/2020  . Malnutrition of moderate degree 12/27/2019  . Hyperglycemia due to type 2 diabetes mellitus ([place]) 12/26/2019  . Chronic kidney disease (CKD) stage G3b/A3, moderately decreased glomerular filtration rate (GFR) between 30-44 mL/min/1.73 square meter and albuminuria creatinine ratio greater than 300 mg/g (HCC) 12/26/2019  . History of COVID-19 12/26/2019  . History of CVA (cerebrovascular accident) 12/26/2019  . 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 History of second degree heart block  12/26/2019  . Elevated LFTs 12/26/2019  . Malignant neoplasm of prostate ([place]) 11/24/2016  . Bright red rectal bleeding 10/02/2016  . Chronic systolic heart failure ([place]) 12/12/2015  . Essential hypertension 12/12/2015  . COPD (chronic obstructive pulmonary disease) with chronic bronchitis ([place]) 12/12/2015  . Tobacco abuse 12/12/2015    Orientation RESPIRATION BLADDER Height & Weight     Self,Time,Situation,Place  O2 Incontinent,External catheter Weight: 154 lb 15.7 oz (70.3 kg) Height:  5\' 6"  (167.6 cm)  BEHAVIORAL SYMPTOMS/MOOD NEUROLOGICAL BOWEL NUTRITION STATUS        Diet (heart health/carb modified, thin liquids)  AMBULATORY STATUS COMMUNICATION OF NEEDS Skin   Limited Assist Verbally Other (Comment) (stage 2 sacrum, stage 2 L buttocks, nonpressure wound L, R buttocks)                       Personal Care Assistance Level of Assistance  Bathing,Feeding,Dressing Bathing Assistance: Limited assistance Feeding assistance: Limited assistance Dressing Assistance: Limited assistance     Functional Limitations Info             SPECIAL CARE FACTORS FREQUENCY  PT (By licensed PT),OT (By licensed OT)     PT Frequency: 5 x/week OT Frequency: 5 x/week            Contractures      Additional Factors Info  Code Status,Allergies Code Status Info: full code Allergies Info: lactose intolerant           Current Medications (04/05/2021):  This is the current hospital active medication list Current Facility-Administered Medications  Medication Dose Route Frequency Provider Last Rate Last Admin  . 0.9 %  sodium chloride infusion  Intravenous Continuous Lavonia Dana, MD   Stopped at 04/05/21 [phone]  . acetaminophen (TYLENOL) tablet 650 mg  650 mg Oral Q6H PRN Vernelle Emerald, MD   650 mg at 04/04/21 1943   Or  . acetaminophen (TYLENOL) suppository 650 mg  650 mg Rectal Q6H PRN Shalhoub, Sherryll Burger, MD      . apixaban (ELIQUIS) tablet 2.5 mg  2.5 mg Oral BID  Callwood, Dwayne D, MD      . clopidogrel (PLAVIX) tablet 75 mg  75 mg Oral Daily Callwood, Dwayne D, MD   75 mg at 04/05/21 0920  . ferrous sulfate tablet 325 mg  325 mg Oral BID WC Lorella Nimrod, MD   325 mg at 04/05/21 0917  . hydrocortisone (ANUSOL-HC) suppository 25 mg  25 mg Rectal BID Lorella Nimrod, MD   25 mg at 04/05/21 0910  . insulin aspart (novoLOG) injection 0-5 Units  0-5 Units Subcutaneous QHS Amin, Soundra Pilon, MD      . insulin aspart (novoLOG) injection 0-9 Units  0-9 Units Subcutaneous TID WC Lorella Nimrod, MD   1 Units at 04/05/21 0915  . insulin glargine (LANTUS) injection 10 Units  10 Units Subcutaneous Q2200 Vernelle Emerald, MD   10 Units at 04/04/21 2306  . ipratropium-albuterol (DUONEB) 0.5-2.5 (3) MG/3ML nebulizer solution 3 mL  3 mL Nebulization Q6H PRN Belue, Alver Sorrow, RPH      . ondansetron (ZOFRAN) tablet 4 mg  4 mg Oral Q6H PRN Shalhoub, Sherryll Burger, MD       Or  . ondansetron (ZOFRAN) injection 4 mg  4 mg Intravenous Q6H PRN Shalhoub, Sherryll Burger, MD      . pantoprazole (PROTONIX) EC tablet 40 mg  40 mg Oral Daily Shalhoub, Sherryll Burger, MD   40 mg at 04/05/21 0916  . polyethylene glycol (MIRALAX / GLYCOLAX) packet 17 g  17 g Oral Daily PRN Shalhoub, Sherryll Burger, MD      . simvastatin (ZOCOR) tablet 20 mg  20 mg Oral q AM Shalhoub, Sherryll Burger, MD   20 mg at 04/05/21 0917  . tiotropium (SPIRIVA) inhalation capsule ([facility] use ONLY) 18 mcg  18 mcg Inhalation Daily Shalhoub, Sherryll Burger, MD   18 mcg at 04/04/21 1014  . traZODone (DESYREL) tablet 50 mg  50 mg Oral QHS Shalhoub, Sherryll Burger, MD   50 mg at 04/04/21 2250     Discharge Medications: Please see discharge summary for a list of discharge medications.  Relevant Imaging Results:  Relevant Lab Results:   Additional Information SS #: [id number] [place], LCSW

## 2021-04-05 NOTE — TOC Initial Note (Addendum)
Transition of Care [facility]) - Initial/Assessment Note    Patient Details  Name: [name] MRN: [id number] Date of Birth: 1946-07-06  Transition of Care [facility]) CM/SW Contact:    Magnus Ivan, LCSW Phone Number: 04/05/2021, 9:16 AM  Clinical Narrative:                Spoke to patient's wife Bethena Roys for high risk screening. Patient lives with his wife. PCP is Dr. Baldemar Lenis. Pharmacy is [facility]. Patient has a tub bench, 3 in 1, RW, rollator, wheelchair, and motorized chair (called a Zinger and Zoomer per wife) at home. Patient was supposed to start [facility] this week, but Bethena Roys was unsure of agency. Patient went to [facility] in April, Bethena Roys believes it was for about 4 weeks. CSW explained PT rec for SNF. Bethena Roys is agreeable and prefers patient return to [facility] for more short term rehab. Patient has not had the COVID vaccines. CSW will start SNF work up.   12:57- Called Ricky at [employer]. He stated patient just discharged from [facility] last week so is in his copay days and owes $1,288 to [facility]. Per Audry Pili if family can pay that, he can come back at $184 a day. Family can call Juliann Pulse at [facility] with questions per [place]. CSW notified patient's wife who said she will reach out [facility] to pay balance owed. Per Audry Pili, [facility] will have a bed for patient on Monday. Wife aware. CSW started [place] for [facility] and [facility] with [employer] Rep Tammy.  2:00- Call from patient's wife who is unsure if she can afford copays. She called [facility] then called CSW back and said she has worked out a Training and development officer and still wants patient to go to [facility] on Monday as planned (pending insurance authorization).  2:20- Call from [facility] with [employer]. She recommended that PT work with patient again over the weekend and TOC to restart auth after PT sees patient. Updated MD and PT. Left TOC handoff.  Expected Discharge Plan: Skilled Nursing Facility Barriers to Discharge: Continued Medical  Work up   Patient Goals and CMS Choice Patient states their goals for this hospitalization and ongoing recovery are:: SNF rehab CMS Medicare.gov Compare Post Acute Care list provided to:: Patient Represenative (must comment) Choice offered to / list presented to : Spouse  Expected Discharge Plan and Services Expected Discharge Plan: [place] arrangements for the past 2 months: Single Family Home                                      Prior Living Arrangements/Services Living arrangements for the past 2 months: Single Family Home Lives with:: Spouse Patient language and need for interpreter reviewed:: Yes Do you feel safe going back to the place where you live?: Yes      Need for Family Participation in Patient Care: Yes (Comment) Care giver support system in place?: Yes (comment)   Criminal Activity/Legal Involvement Pertinent to Current Situation/Hospitalization: No - Comment as needed  Activities of Daily Living Home Assistive Devices/Equipment: Gilford Rile (specify type) ADL Screening (condition at time of admission) Patient's cognitive ability adequate to safely complete daily activities?: Yes Is the patient deaf or have difficulty hearing?: No Does the patient have difficulty seeing, even when wearing glasses/contacts?: No Does the patient have difficulty concentrating, remembering, or making decisions?: No Patient able to express  need for assistance with ADLs?: Yes Does the patient have difficulty dressing or bathing?: Yes Independently performs ADLs?: No Communication: Independent Dressing (OT): Needs assistance Is this a change from baseline?: Pre-admission baseline Grooming: Needs assistance Is this a change from baseline?: Pre-admission baseline Feeding: Independent Bathing: Needs assistance Is this a change from baseline?: Pre-admission baseline Toileting: Needs assistance Is this a change from baseline?: Pre-admission  baseline In/Out Bed: Dependent Is this a change from baseline?: Pre-admission baseline Walks in Home: Dependent Is this a change from baseline?: Pre-admission baseline Does the patient have difficulty walking or climbing stairs?: Yes Weakness of Legs: Both Weakness of Arms/Hands: None  Permission Sought/Granted Permission sought to share information with : Chartered certified accountant granted to share information with : Yes, Verbal Permission Granted (by wife Rowan Blaker)     Permission granted to share info w AGENCY: SNFs        Emotional Assessment         Alcohol / Substance Use: Not Applicable Psych Involvement: No (comment)  Admission diagnosis:  Hyponatremia [E87.1] Weakness [R53.1] Troponin level elevated [R77.8] AKI (acute kidney injury) ([place]) [N17.9] Patient Active Problem List   Diagnosis Date Noted  . Hyponatremia 04/02/2021  . Pressure injury of skin 02/20/2021  . AKI (acute kidney injury) ([place]) 02/19/2021  . Type 2 diabetes mellitus with hypoglycemia without coma ([place]) 02/19/2021  . Bradycardia 02/19/2021  . Weakness 02/19/2021  . Dental infection 02/19/2021  . Acute respiratory failure ([place]) 01/28/2021  . Acute renal failure superimposed on stage 3b chronic kidney disease ([place]) 01/28/2021  . Atrial fibrillation, chronic ([place]) 01/28/2021  . Acute CHF (congestive heart failure) ([place]) 11/15/2020  . Chronic diastolic CHF (congestive heart failure) ([place]) 10/24/2020  . Elevated troponin 10/24/2020  . CKD (chronic kidney disease), stage IV ([place]) 10/24/2020  . Malnutrition of moderate degree 12/27/2019  . Hyperglycemia due to type 2 diabetes mellitus ([place]) 12/26/2019  . Chronic kidney disease (CKD) stage G3b/A3, moderately decreased glomerular filtration rate (GFR) between 30-44 mL/min/1.73 square meter and albuminuria creatinine ratio greater than 300 mg/g (HCC) 12/26/2019  . History of COVID-19 12/26/2019  . History of CVA (cerebrovascular  accident) 12/26/2019  . 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 History of second degree heart block 12/26/2019  . Elevated LFTs 12/26/2019  . Malignant neoplasm of prostate ([place]) 11/24/2016  . Bright red rectal bleeding 10/02/2016  . Chronic systolic heart failure ([place]) 12/12/2015  . Essential hypertension 12/12/2015  . COPD (chronic obstructive pulmonary disease) with chronic bronchitis ([place]) 12/12/2015  . Tobacco abuse 12/12/2015   PCP:  [name], MD Pharmacy:   [facility] [address], [place] [address] Phone: [phone] Fax: [phone]     Social Determinants of Health (SDOH) Interventions    Readmission Risk Interventions Readmission Risk Prevention Plan 04/05/2021 02/04/2021 10/28/2020  Transportation Screening Complete Complete Complete  PCP or Specialist Appt within 3-5 Days - - -  HRI or [place] - - -  Medication Review (RN Care Manager) Complete Complete Complete  PCP or Specialist appointment within 3-5 days of discharge Complete Complete Complete  HRI or Home Care Consult Complete Complete Complete  SW Recovery Care/Counseling Consult Complete Complete -  Palliative Care Screening Not Applicable Not Applicable Not Applicable  Skilled Nursing Facility Complete Not Applicable Not Applicable  Some recent data might be hidden

## 2021-04-05 NOTE — Progress Notes (Signed)
Physical Therapy Treatment Patient Details Name: [name] MRN: [id number] DOB: 1945-11-14 Today's Date: 04/05/2021    History of Present Illness Pt is a 75 y/o male w/ hx of CVA, PNA, PVD, HTN, HLD, GERD, DM, CAD, COPD, CHF s/p CABG, presented to ED w/ SOB. Admitted for acute hyponatremia.    PT Comments    Agrees to session despite anal pain.  Participated in exercises as described below.  Yells out with transitions to/from EOB with mod a x 1.  Steady in sitting.  He is able to stand briefly with mod a x 1 and sidestep along bed with min a x 1.  Limited by fatigue and feeling as legs will buckle.  Discussed with pt.  Pt lives with wife with heavy assistance from her.  Stated he is mostly bedbound but does get up to bedside commode.  He relies on her for ADL tasks and meals.  Pt agrees he would struggle significantly if discharged home at this time and agrees that SNF is best discharge option.     Follow Up Recommendations  SNF;Supervision/Assistance - 24 hour     Equipment Recommendations       Recommendations for Other Services       Precautions / Restrictions Precautions Precautions: Fall    Mobility  Bed Mobility Overal bed mobility: Needs Assistance Bed Mobility: Supine to Sit;Sit to Supine     Supine to sit: Mod assist Sit to supine: Mod assist        Transfers Overall transfer level: Needs assistance Equipment used: Rolling walker (2 wheeled) Transfers: Sit to/from Stand Sit to Stand: Min assist            Ambulation/Gait Ambulation/Gait assistance: Min Web designer (Feet): 2 Feet Assistive device: Rolling walker (2 wheeled) Gait Pattern/deviations: Step-to pattern Gait velocity: dec   General Gait Details: sidesteps along bed   Stairs             Wheelchair Mobility    Modified Rankin (Stroke Patients Only)       Balance Overall balance assessment: Needs assistance Sitting-balance support: Feet supported Sitting  balance-Leahy Scale: Fair     Standing balance support: Bilateral upper extremity supported Standing balance-Leahy Scale: Fair                              Cognition Arousal/Alertness: Awake/alert Behavior During Therapy: WFL for tasks assessed/performed;Agitated Overall Cognitive Status: Within Functional Limits for tasks assessed                                        Exercises Total Joint Exercises Ankle Circles/Pumps: AROM;Strengthening;Both;10 reps;Supine Quad Sets: AROM;Strengthening;Both;10 reps;Supine Gluteal Sets: AROM;Strengthening;Both;10 reps;Supine Hip ABduction/ADduction: AROM;Strengthening;Both;10 reps;Supine Straight Leg Raises: AROM;Strengthening;Both;10 reps;Supine    General Comments        Pertinent Vitals/Pain Pain Assessment: Faces Faces Pain Scale: Hurts whole lot Pain Location: Hemorrhoids Pain Descriptors / Indicators: Sore Pain Intervention(s): Limited activity within patient's tolerance;Repositioned;Monitored during session    Home Living                      Prior Function            PT Goals (current goals can now be found in the care plan section) Progress towards PT goals: Progressing toward goals    Frequency  Min 2X/week      PT Plan      Co-evaluation              AM-PAC PT "6 Clicks" Mobility   Outcome Measure  Help needed turning from your back to your side while in a flat bed without using bedrails?: A Little Help needed moving from lying on your back to sitting on the side of a flat bed without using bedrails?: A Little Help needed moving to and from a bed to a chair (including a wheelchair)?: A Little Help needed standing up from a chair using your arms (e.g., wheelchair or bedside chair)?: A Lot Help needed to walk in hospital room?: A Lot Help needed climbing 3-5 steps with a railing? : A Lot 6 Click Score: 15    End of Session Equipment Utilized During Treatment:  Gait belt Activity Tolerance: Patient tolerated treatment well Patient left: in bed;with call bell/phone within reach Nurse Communication: Mobility status;Patient requests pain meds PT Visit Diagnosis: Unsteadiness on feet (R26.81);Other abnormalities of gait and mobility (R26.89);Repeated falls (R29.6);Muscle weakness (generalized) (M62.81);History of falling (Z91.81);Difficulty in walking, not elsewhere classified (R26.2)     Time: 8871-9597 PT Time Calculation (min) (ACUTE ONLY): 16 min  Charges:  $Therapeutic Activity: 8-22 mins                    Chesley Noon, PTA 04/05/21, 3:26 PM

## 2021-04-05 NOTE — Progress Notes (Signed)
[facility]  ROUNDING NOTE   Subjective:   [name] is admitted to [facility] on 04/02/2021 for Hyponatremia [E87.1] Weakness [R53.1] Troponin level elevated [R77.8] AKI (acute kidney injury) ([place]) [N17.9]  Hypertonic saline on admission.   Patient more awake and alert today.  Currently eating breakfast Na 131.   Objective:  Vital signs in last 24 hours:  Temp:  [97.7 F (36.5 C)-98.7 F (37.1 C)] 98.7 F (37.1 C) (06/03 0827) Pulse Rate:  [50-70] 70 (06/03 0827) Resp:  [12-23] 16 (06/03 0827) BP: (116-144)/(44-98) 144/49 (06/03 0827) SpO2:  [91 %-100 %] 99 % (06/03 0827) Weight:  [70.3 kg] 70.3 kg (06/02 1817)  Weight change:  Filed Weights   04/02/21 1538 04/04/21 1817  Weight: 65.8 kg 70.3 kg    Intake/Output: I/O last 3 completed shifts: In: 315.6 [P.O.:240; I.V.:75.6] Out: 800 [Urine:800]   Intake/Output this shift:  Total I/O In: 308.3 [P.O.:240; I.V.:68.3] Out: -   Physical Exam: General: NAD, laying in bed  Head: Normocephalic, atraumatic. Dry oral mucosal membranes  Eyes: Anicteric  Lungs:  Clear to auscultation, 2L [place]  Heart: Regular rate and rhythm  Abdomen:  Soft, nontender,   Extremities: no peripheral edema.  Neurologic: Alert, altered  Skin: No lesions       Basic Metabolic Panel: Recent Labs  Lab 04/02/21 1550 04/02/21 1832 04/03/21 0140 04/03/21 0357 04/03/21 1749 04/04/21 0457 04/05/21 0438  NA 115*   < > 120* 122* 123* 127* 131*  K 5.0  --   --  4.5  --  4.3 3.9  CL 73*  --   --  83*  --  91* 96*  CO2 20*  --   --  26  --  22 24  GLUCOSE 192*  --   --  111*  --  136* 134*  BUN 114*  --   --  109*  --  93* 85*  CREATININE 5.48*  --   --  4.69*  --  3.37* 2.37*  CALCIUM 8.5*  --   --  8.1*  --  8.1* 8.3*  MG  --   --   --  3.4*  --   --   --    < > = values in this interval not displayed.    Liver Function Tests: Recent Labs  Lab 04/02/21 1639 04/03/21 0357  AST 44* 51*  ALT 14 15  ALKPHOS 109 101   BILITOT 0.8 0.7  PROT 6.5 6.4*  ALBUMIN 3.5 3.4*   No results for input(s): LIPASE, AMYLASE in the last 168 hours. Recent Labs  Lab 04/02/21 1639  AMMONIA <9*    CBC: Recent Labs  Lab 04/02/21 1550 04/03/21 0357 04/04/21 0457 04/05/21 0438  WBC 17.5* 9.6 13.7* 10.9*  NEUTROABS  --  7.6  --   --   HGB 7.3* 7.2* 7.9* 7.9*  HCT 23.1* 22.8* 25.0* 25.7*  MCV 73.6* 74.3* 75.5* 76.9*  PLT 273 259 239 280    Cardiac Enzymes: No results for input(s): CKTOTAL, CKMB, CKMBINDEX, TROPONINI in the last 168 hours.  BNP: Invalid input(s): POCBNP  CBG: Recent Labs  Lab 04/04/21 0732 04/04/21 1111 04/04/21 1657 04/04/21 2118 04/05/21 0829  GLUCAP 127* 237* 251* 181* 123*    Microbiology: Results for orders placed or performed during the hospital encounter of 04/02/21  Resp Panel by RT-PCR (Flu A&B, Covid) Nasopharyngeal Swab     Status: None   Collection Time: 04/02/21  4:39 PM   Specimen:  Nasopharyngeal Swab; Nasopharyngeal(NP) swabs in vial transport medium  Result Value Ref Range Status   SARS Coronavirus 2 by RT PCR NEGATIVE NEGATIVE Final    Comment: (NOTE) SARS-CoV-2 target nucleic acids are NOT DETECTED.  The SARS-CoV-2 RNA is generally detectable in upper respiratory specimens during the acute phase of infection. The lowest concentration of SARS-CoV-2 viral copies this assay can detect is 138 copies/mL. A negative result does not preclude SARS-Cov-2 infection and should not be used as the sole basis for treatment or other patient management decisions. A negative result may occur with  improper specimen collection/handling, submission of specimen other than nasopharyngeal swab, presence of viral mutation(s) within the areas targeted by this assay, and inadequate number of viral copies(<138 copies/mL). A negative result must be combined with clinical observations, patient history, and epidemiological information. The expected result is Negative.  Fact Sheet for  Patients:  EntrepreneurPulse.com.au  Fact Sheet for Healthcare Providers:  IncredibleEmployment.be  This test is no t yet approved or cleared by the [place] FDA and  has been authorized for detection and/or diagnosis of SARS-CoV-2 by FDA under an Emergency Use Authorization (EUA). This EUA will remain  in effect (meaning this test can be used) for the duration of the COVID-19 declaration under Section 564(b)(1) of the Act, 21 U.S.C.section 360bbb-3(b)(1), unless the authorization is terminated  or revoked sooner.       Influenza A by PCR NEGATIVE NEGATIVE Final   Influenza B by PCR NEGATIVE NEGATIVE Final    Comment: (NOTE) The Xpert Xpress SARS-CoV-2/FLU/RSV plus assay is intended as an aid in the diagnosis of influenza from Nasopharyngeal swab specimens and should not be used as a sole basis for treatment. Nasal washings and aspirates are unacceptable for Xpert Xpress SARS-CoV-2/FLU/RSV testing.  Fact Sheet for Patients: EntrepreneurPulse.com.au  Fact Sheet for Healthcare Providers: IncredibleEmployment.be  This test is not yet approved or cleared by the [place] FDA and has been authorized for detection and/or diagnosis of SARS-CoV-2 by FDA under an Emergency Use Authorization (EUA). This EUA will remain in effect (meaning this test can be used) for the duration of the COVID-19 declaration under Section 564(b)(1) of the Act, 21 U.S.C. section 360bbb-3(b)(1), unless the authorization is terminated or revoked.  Performed at [facility], [address] [place], [place] [address]   Blood culture (single)     Status: None (Preliminary result)   Collection Time: 04/02/21  4:39 PM   Specimen: BLOOD  Result Value Ref Range Status   Specimen Description BLOOD LEFT ANTECUBITAL  Final   Special Requests BOTTLES DRAWN AEROBIC AND ANAEROBIC LOW VOLUME  Final   Culture   Final    NO  GROWTH 3 DAYS Performed at [facility], [address] [place], [place] [address]    Report Status PENDING  Incomplete    Coagulation Studies: Recent Labs    04/02/21 1639  LABPROT 16.2*  INR 1.3*    Urinalysis: Recent Labs    04/02/21 1639  COLORURINE AMBER*  LABSPEC 1.015  PHURINE 5.0  GLUCOSEU NEGATIVE  HGBUR NEGATIVE  BILIRUBINUR NEGATIVE  KETONESUR NEGATIVE  PROTEINUR NEGATIVE  NITRITE NEGATIVE  LEUKOCYTESUR NEGATIVE      Imaging: No results found.   Medications:   . sodium chloride Stopped (04/05/21 0452)   . apixaban  2.5 mg Oral BID  . clopidogrel  75 mg Oral Daily  . ferrous sulfate  325 mg Oral BID WC  . hydrocortisone  25 mg Rectal BID  .  insulin aspart  0-5 Units Subcutaneous QHS  . insulin aspart  0-9 Units Subcutaneous TID WC  . insulin glargine  10 Units Subcutaneous Q2200  . pantoprazole  40 mg Oral Daily  . simvastatin  20 mg Oral q AM  . tiotropium  18 mcg Inhalation Daily  . traZODone  50 mg Oral QHS   acetaminophen **OR** acetaminophen, ipratropium-albuterol, ondansetron **OR** ondansetron (ZOFRAN) IV, polyethylene glycol  Assessment/ Plan:  [name] is a 75 y.o. white male with CVA, prostate cancer, peripheral vascular disease, hypertension, hyperlipidemia, GERD, diabetes mellitus type II, COPD, congestive heart failure, atrial fibrillation who is admitted to [facility] on 04/02/2021 for Hyponatremia [E87.1] Weakness [R53.1] Troponin level elevated [R77.8] AKI (acute kidney injury) ([place]) [N17.9]  1. Hyponatremia: acute on chronic. Baseline of 130-135.  Poor PO intake and continued use of metalozone.  Emergent hypertonic saline administered on admission Remains hypovolumic - IVF stopped - appetite improving   2. Acute kidney injury: on chronic kidney disease stage IIIB with history of proteinuria. Baseline creatinine of 1.9, GFR of 35 on 12/14/2020.  AKI consistent with prerenal azotemia No IV contrast exposure.   Chronic Kidney Disease secondary to diabetic nephropathy and hypertension.  - Renal function improving  - Will monitor  3. Hypertension: hypovolumic. Holding home medications. Echocardiogram from 11/15/2020 with normal diastolic and systolic function.  Holding home regimen of metolazone and torsemide.   4.  Anemia with renal failure: hemoglobin 7.9, microcytic. Consistent with iron deficiency    LOS: 3   6/3/202210:15 AM

## 2021-04-05 NOTE — Progress Notes (Signed)
PROGRESS NOTE    [name]  ZOX:[id number] DOB: [birth year] DOA: 04/02/2021 PCP: [name], MD   Brief Narrative: Taken from H&P. 75 year old male with past medical history of chronic kidney disease stage IIIb, diastolic congestive heart failure (Echo 11/2020 EF 50-55%), diabetes mellitus type 2, coronary artery disease status post CABG presented to [facility] emergency department with complaints of weakness. He was found to have hyponatremia with sodium of 115 with AKI.  Initially started on hypertonic saline, which was switched with normal saline today by nephrology.  Of note, patient was recently hospitalized at [facility] from 4/19 until 4/30.  Patient was admitted for acute kidney injury with ATN with volume overload.  Patient required a furosemide infusion for portion of the hospitalization and was eventually transitioned over to a combination of torsemide and metolazone at time of discharge.  Patient was eventually discharged on 4/30.  Markedly elevated troponin with concern of NSTEMI, cardiology is recommending conservative management. PT is recommending SNF placement  Subjective: Patient was feeling weak and tired.  No chest pain or shortness of breath.  Assessment & Plan:   Active Problems:   Chronic systolic heart failure (HCC)   Essential hypertension   COPD (chronic obstructive pulmonary disease) with chronic bronchitis (HCC)   Acute renal failure superimposed on stage 3b chronic kidney disease (HCC)   Atrial fibrillation, chronic (HCC)   Weakness   Hyponatremia  Acute on chronic hyponatremia.  Baseline sodium of 130-135.  Most likely secondary to poor p.o. intake and the use of diuretics.  Initially requiring hypertonic saline.  Sodium improved to 131 today.  Nephrology is following-appreciate their help. -Continue to monitor sodium. -Might need to stop diuretics on discharge and he can have a close follow-up with his provider.  AKI with CKD stage IIIb.  Baseline creatinine  around 1.9.  Most likely prerenal.  Creatinine started improving with IV fluid -Continue with IV fluid -Monitor renal function -Avoid nephrotoxins  Elevated troponin.  No chest pain but troponin was elevated and 600 and peaked at 4862 which can be due to NSTEMI.  He was started on heparin infusion, no plan for cardiac catheterization per cardiology at this time.  Patient was on heparin infusion for 2 days which was stopped this morning by cardiology. -Cardiology is recommending conservative management  Lactic acidosis.  Lactic acid on admission was 3.7>>1.3 -Continue with IV fluid  Hypertension.  Blood pressure currently within goal.  Home antihypertensives are being held due to softer blood pressure on admission. -Continue holding antihypertensives and monitor.  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  Hemoglobin currently stable at 7.9.  Received 1 dose of Feraheme. -Continue iron supplement -We will get benefit with EPO -Monitor hemoglobin -Transfuse if below 7  Chronic HFpEF.  According to echocardiogram done in January 2022, EF was 50 to 55% and normal diastolic function.  Mildly decreased or within lower normal limit of EF. Patient appears dry. -Keep holding home dose of torsemide and metolazone-can discontinue on discharge with a close outpatient follow-up.  Chronic atrial fibrillation.  Rate controlled -Continue home dose of Eliquis  GERD. -Continue PPI  Type 2 diabetes mellitus with CKD stage IIIb and long-term use of insulin.  Uses 10 units of Lantus at bedtime along with SSI at home. -Continue home dose of Lantus -Continue with SSI  History of COPD with chronic bronchitis.  No concern of exacerbation at this time. -Continue to monitor  Objective: Vitals:   04/05/21 0446 04/05/21 0827 04/05/21  1207 04/05/21 1547  BP: (!) 130/54 (!) 144/49 (!) 148/47 (!) 139/40  Pulse: (!) 57 70 60 (!) 55  Resp: 20 16  20   Temp: 98.2 F (36.8 C) 98.7  F (37.1 C)  97.8 F (36.6 C)  TempSrc: Oral Oral    SpO2: 91% 99% 99% 99%  Weight:      Height:        Intake/Output Summary (Last 24 hours) at 04/05/2021 1612 Last data filed at 04/05/2021 1533 Gross per 24 hour  Intake 963.86 ml  Output 1500 ml  Net -536.14 ml   Filed Weights   04/02/21 1538 04/04/21 1817  Weight: 65.8 kg 70.3 kg    Examination:  General.  Little lethargic gentleman, in no acute distress. Pulmonary.  Lungs clear bilaterally, normal respiratory effort. CV.  Regular rate and rhythm, no JVD, rub or murmur. Abdomen.  Soft, nontender, nondistended, BS positive. CNS.  Alert and oriented x3.  No focal neurologic deficit. Extremities.  No edema, no cyanosis, pulses intact and symmetrical. Psychiatry.  Judgment and insight appears normal.  DVT prophylaxis: Eliquis Code Status: Full Family Communication:  Disposition Plan:  Status is: Inpatient  Remains inpatient appropriate because:Inpatient level of care appropriate due to severity of illness   Dispo: The patient is from: Home              Anticipated d/c is to: Home              Patient currently is not medically stable to d/c.   Difficult to place patient No                Level of care: Med-Surg  All the records are reviewed and case discussed with Care Management/Social Worker. Management plans discussed with the patient, nursing and they are in agreement.  Consultants:   Nephrology  Procedures:  Antimicrobials:   Data Reviewed: I have personally reviewed following labs and imaging studies  CBC: Recent Labs  Lab 04/02/21 1550 04/03/21 0357 04/04/21 0457 04/05/21 0438  WBC 17.5* 9.6 13.7* 10.9*  NEUTROABS  --  7.6  --   --   HGB 7.3* 7.2* 7.9* 7.9*  HCT 23.1* 22.8* 25.0* 25.7*  MCV 73.6* 74.3* 75.5* 76.9*  PLT 273 259 239 716   Basic Metabolic Panel: Recent Labs  Lab 04/02/21 1550 04/02/21 1832 04/03/21 0140 04/03/21 0357 04/03/21 1749 04/04/21 0457 04/05/21 0438  NA 115*    < > 120* 122* 123* 127* 131*  K 5.0  --   --  4.5  --  4.3 3.9  CL 73*  --   --  83*  --  91* 96*  CO2 20*  --   --  26  --  22 24  GLUCOSE 192*  --   --  111*  --  136* 134*  BUN 114*  --   --  109*  --  93* 85*  CREATININE 5.48*  --   --  4.69*  --  3.37* 2.37*  CALCIUM 8.5*  --   --  8.1*  --  8.1* 8.3*  MG  --   --   --  3.4*  --   --   --    < > = values in this interval not displayed.   GFR: Estimated Creatinine Clearance: 24.3 mL/min (A) (by C-G formula based on SCr of 2.37 mg/dL (H)). Liver Function Tests: Recent Labs  Lab 04/02/21 1639 04/03/21 0357  AST 44* 51*  ALT 14  15  ALKPHOS 109 101  BILITOT 0.8 0.7  PROT 6.5 6.4*  ALBUMIN 3.5 3.4*   No results for input(s): LIPASE, AMYLASE in the last 168 hours. Recent Labs  Lab 04/02/21 1639  AMMONIA <9*   Coagulation Profile: Recent Labs  Lab 04/02/21 1639  INR 1.3*   Cardiac Enzymes: No results for input(s): CKTOTAL, CKMB, CKMBINDEX, TROPONINI in the last 168 hours. BNP (last 3 results) No results for input(s): PROBNP in the last 8760 hours. HbA1C: Recent Labs    04/03/21 0357  HGBA1C 6.8*   CBG: Recent Labs  Lab 04/04/21 1111 04/04/21 1657 04/04/21 2118 04/05/21 0829 04/05/21 1247  GLUCAP 237* 251* 181* 123* 142*   Lipid Profile: No results for input(s): CHOL, HDL, LDLCALC, TRIG, CHOLHDL, LDLDIRECT in the last 72 hours. Thyroid Function Tests: Recent Labs    04/02/21 1639  TSH 5.348*   Anemia Panel: Recent Labs    04/03/21 0357  TIBC 455*  IRON 17*   Sepsis Labs: Recent Labs  Lab 04/02/21 1639 04/03/21 1749 04/03/21 2040  LATICACIDVEN 3.7* 1.3 1.3    Recent Results (from the past 240 hour(s))  Resp Panel by RT-PCR (Flu A&B, Covid) Nasopharyngeal Swab     Status: None   Collection Time: 04/02/21  4:39 PM   Specimen: Nasopharyngeal Swab; Nasopharyngeal(NP) swabs in vial transport medium  Result Value Ref Range Status   SARS Coronavirus 2 by RT PCR NEGATIVE NEGATIVE Final     Comment: (NOTE) SARS-CoV-2 target nucleic acids are NOT DETECTED.  The SARS-CoV-2 RNA is generally detectable in upper respiratory specimens during the acute phase of infection. The lowest concentration of SARS-CoV-2 viral copies this assay can detect is 138 copies/mL. A negative result does not preclude SARS-Cov-2 infection and should not be used as the sole basis for treatment or other patient management decisions. A negative result may occur with  improper specimen collection/handling, submission of specimen other than nasopharyngeal swab, presence of viral mutation(s) within the areas targeted by this assay, and inadequate number of viral copies(<138 copies/mL). A negative result must be combined with clinical observations, patient history, and epidemiological information. The expected result is Negative.  Fact Sheet for Patients:  EntrepreneurPulse.com.au  Fact Sheet for Healthcare Providers:  IncredibleEmployment.be  This test is no t yet approved or cleared by the [place] FDA and  has been authorized for detection and/or diagnosis of SARS-CoV-2 by FDA under an Emergency Use Authorization (EUA). This EUA will remain  in effect (meaning this test can be used) for the duration of the COVID-19 declaration under Section 564(b)(1) of the Act, 21 U.S.C.section 360bbb-3(b)(1), unless the authorization is terminated  or revoked sooner.       Influenza A by PCR NEGATIVE NEGATIVE Final   Influenza B by PCR NEGATIVE NEGATIVE Final    Comment: (NOTE) The Xpert Xpress SARS-CoV-2/FLU/RSV plus assay is intended as an aid in the diagnosis of influenza from Nasopharyngeal swab specimens and should not be used as a sole basis for treatment. Nasal washings and aspirates are unacceptable for Xpert Xpress SARS-CoV-2/FLU/RSV testing.  Fact Sheet for Patients: EntrepreneurPulse.com.au  Fact Sheet for Healthcare  Providers: IncredibleEmployment.be  This test is not yet approved or cleared by the [place] FDA and has been authorized for detection and/or diagnosis of SARS-CoV-2 by FDA under an Emergency Use Authorization (EUA). This EUA will remain in effect (meaning this test can be used) for the duration of the COVID-19 declaration under Section 564(b)(1) of the Act, 21 U.S.C. section  360bbb-3(b)(1), unless the authorization is terminated or revoked.  Performed at [facility], [address] [place], [place] [address]   Blood culture (single)     Status: None (Preliminary result)   Collection Time: 04/02/21  4:39 PM   Specimen: BLOOD  Result Value Ref Range Status   Specimen Description BLOOD LEFT ANTECUBITAL  Final   Special Requests BOTTLES DRAWN AEROBIC AND ANAEROBIC LOW VOLUME  Final   Culture   Final    NO GROWTH 3 DAYS Performed at [facility], [address] [place],  [address]    Report Status PENDING  Incomplete     Radiology Studies: No results found.  Scheduled Meds: . apixaban  2.5 mg Oral BID  . clopidogrel  75 mg Oral Daily  . feeding supplement  1 Container Oral TID BM  . feeding supplement  237 mL Oral TID BM  . ferrous sulfate  325 mg Oral BID WC  . hydrocortisone  25 mg Rectal BID  . insulin aspart  0-5 Units Subcutaneous QHS  . insulin aspart  0-9 Units Subcutaneous TID WC  . insulin glargine  10 Units Subcutaneous Q2200  . multivitamin with minerals  1 tablet Oral Daily  . pantoprazole  40 mg Oral Daily  . simvastatin  20 mg Oral q AM  . tiotropium  18 mcg Inhalation Daily  . traZODone  50 mg Oral QHS   Continuous Infusions: . sodium chloride Stopped (04/05/21 0452)     LOS: 3 days   Time spent: 35 minutes. More than 50% of the time was spent in counseling/coordination of care  Lorella Nimrod, MD [facility]  If 7PM-7AM, please contact night-coverage [email or web address]  04/05/2021, 4:12 PM    This record has been created using Systems analyst. Errors have been sought and corrected,but may not always be located. Such creation errors do not reflect on the standard of care.

## 2021-04-05 NOTE — Progress Notes (Signed)
[facility] Cardiology    SUBJECTIVE: Patient states he feels reasonably well denies any chest pain complains of pain in his foot no fever chills or sweats no nausea vomiting no swelling   Vitals:   04/04/21 1817 04/04/21 2009 04/05/21 0446 04/05/21 0827  BP: (!) 121/51 (!) 116/44 (!) 130/54 (!) 144/49  Pulse: (!) 59 65 (!) 57 70  Resp: 18 20 20    Temp: 98.2 F (36.8 C) 97.7 F (36.5 C) 98.2 F (36.8 C) 98.7 F (37.1 C)  TempSrc: Oral Oral Oral Oral  SpO2: 97% 95% 91% 99%  Weight: 70.3 kg     Height: 5\' 6"  (1.676 m)        Intake/Output Summary (Last 24 hours) at 04/05/2021 0846 Last data filed at 04/05/2021 3235 Gross per 24 hour  Intake 315.57 ml  Output 800 ml  Net -484.43 ml      PHYSICAL EXAM  General: Well developed, well nourished, in no acute distress HEENT:  Normocephalic and atramatic Neck:  No JVD.  Lungs: Clear bilaterally to auscultation and percussion. Heart: Irregular irregular. Normal S1 and S2 without gallops or murmurs.  Abdomen: Bowel sounds are positive, abdomen soft and non-tender  Msk:  Back normal, normal gait. Normal strength and tone for age. Extremities: No clubbing, cyanosis or edema.   Neuro: Alert and oriented X 3 lethargic. Psych:  Good affect, responds appropriately   LABS: Basic Metabolic Panel: Recent Labs    04/03/21 0357 04/03/21 1749 04/04/21 0457 04/05/21 0438  NA 122*   < > 127* 131*  K 4.5  --  4.3 3.9  CL 83*  --  91* 96*  CO2 26  --  22 24  GLUCOSE 111*  --  136* 134*  BUN 109*  --  93* 85*  CREATININE 4.69*  --  3.37* 2.37*  CALCIUM 8.1*  --  8.1* 8.3*  MG 3.4*  --   --   --    < > = values in this interval not displayed.   Liver Function Tests: Recent Labs    04/02/21 1639 04/03/21 0357  AST 44* 51*  ALT 14 15  ALKPHOS 109 101  BILITOT 0.8 0.7  PROT 6.5 6.4*  ALBUMIN 3.5 3.4*   No results for input(s): LIPASE, AMYLASE in the last 72 hours. CBC: Recent Labs    04/03/21 0357 04/04/21 0457 04/05/21 0438   WBC 9.6 13.7* 10.9*  NEUTROABS 7.6  --   --   HGB 7.2* 7.9* 7.9*  HCT 22.8* 25.0* 25.7*  MCV 74.3* 75.5* 76.9*  PLT 259 239 280   Cardiac Enzymes: No results for input(s): CKTOTAL, CKMB, CKMBINDEX, TROPONINI in the last 72 hours. BNP: Invalid input(s): POCBNP D-Dimer: No results for input(s): DDIMER in the last 72 hours. Hemoglobin A1C: Recent Labs    04/03/21 0357  HGBA1C 6.8*   Fasting Lipid Panel: No results for input(s): CHOL, HDL, LDLCALC, TRIG, CHOLHDL, LDLDIRECT in the last 72 hours. Thyroid Function Tests: Recent Labs    04/02/21 1639  TSH 5.348*   Anemia Panel: Recent Labs    04/03/21 0357  TIBC 455*  IRON 17*    No results found.   Echo echocardiogram 1/22 showed previous left ventricular function EF around 50 to 55%  TELEMETRY: Atrial fibrillation rate of 65:  ASSESSMENT AND PLAN:  Active Problems:   Chronic systolic heart failure (HCC)   Essential hypertension   COPD (chronic obstructive pulmonary disease) with chronic bronchitis (HCC)   Acute renal failure superimposed  on stage 3b chronic kidney disease (HCC)   Atrial fibrillation, chronic (HCC)   Weakness   Hyponatremia Non-STEMI Altered mental status Anemia probably related to renal insufficiency Hyponatremia  Plan Agree with telemetry to follow-up EKGs troponins Will consider discontinuing heparin today Do not recommend an invasive strategy multiple comorbid problems including renal insufficiency Poor ACE inhibitor candidate because of renal insufficiency Recommend aggressive medical therapy Correct electrolytes including sodium Nephrology input for severe renal insufficiency Long-term anticoagulation for atrial fibrillation Inhalers as necessary for COPD Continue hypertension management and control Conservative cardiac input with an invasive strategy   Yolonda Kida, MD 04/05/2021 8:46 AM

## 2021-04-06 DIAGNOSIS — L89152 Pressure ulcer of sacral region, stage 2: Secondary | ICD-10-CM

## 2021-04-06 LAB — BLOOD GAS, ARTERIAL
Acid-Base Excess: 4.4 mmol/L — ABNORMAL HIGH (ref 0.0–2.0)
Allens test (pass/fail): POSITIVE — AB
Bicarbonate: 28.4 mmol/L — ABNORMAL HIGH (ref 20.0–28.0)
FIO2: 0.36
O2 Saturation: 97.8 %
Patient temperature: 37
pCO2 arterial: 39 mmHg (ref 32.0–48.0)
pH, Arterial: 7.47 — ABNORMAL HIGH (ref 7.350–7.450)
pO2, Arterial: 94 mmHg (ref 83.0–108.0)

## 2021-04-06 LAB — BASIC METABOLIC PANEL
Anion gap: 9 (ref 5–15)
BUN: 73 mg/dL — ABNORMAL HIGH (ref 8–23)
CO2: 25 mmol/L (ref 22–32)
Calcium: 8.2 mg/dL — ABNORMAL LOW (ref 8.9–10.3)
Chloride: 96 mmol/L — ABNORMAL LOW (ref 98–111)
Creatinine, Ser: 2.07 mg/dL — ABNORMAL HIGH (ref 0.61–1.24)
GFR, Estimated: 33 mL/min — ABNORMAL LOW (ref >60)
Glucose, Bld: 130 mg/dL — ABNORMAL HIGH (ref 70–99)
Potassium: 4.4 mmol/L (ref 3.5–5.1)
Sodium: 130 mmol/L — ABNORMAL LOW (ref 135–145)

## 2021-04-06 LAB — GLUCOSE, CAPILLARY
Glucose-Capillary: 141 mg/dL — ABNORMAL HIGH (ref 70–99)
Glucose-Capillary: 162 mg/dL — ABNORMAL HIGH (ref 70–99)
Glucose-Capillary: 168 mg/dL — ABNORMAL HIGH (ref 70–99)
Glucose-Capillary: 244 mg/dL — ABNORMAL HIGH (ref 70–99)

## 2021-04-06 NOTE — Progress Notes (Signed)
PROGRESS NOTE    [name]  CBS:[id number] DOB: 1946/08/21 DOA: 04/02/2021 PCP: [name], MD   Brief Narrative: Taken from H&P. 75 year old male with past medical history of chronic kidney disease stage IIIb, diastolic congestive heart failure (Echo 11/2020 EF 50-55%), diabetes mellitus type 2, coronary artery disease status post CABG presented to [facility] emergency department with complaints of weakness. He was found to have hyponatremia with sodium of 115 with AKI.  Initially started on hypertonic saline, which was switched with normal saline today by nephrology.  Of note, patient was recently hospitalized at [facility] from 4/19 until 4/30.  Patient was admitted for acute kidney injury with ATN with volume overload.  Patient required a furosemide infusion for portion of the hospitalization and was eventually transitioned over to a combination of torsemide and metolazone at time of discharge.  Patient was eventually discharged on 4/30.  Markedly elevated troponin with concern of NSTEMI, cardiology is recommending conservative management. PT is recommending SNF placement  Subjective: Patient was feeling very somnolent, drowsing off of during conversation.  Assessment & Plan:   Active Problems:   Chronic systolic heart failure (HCC)   Essential hypertension   COPD (chronic obstructive pulmonary disease) with chronic bronchitis (HCC)   Acute renal failure superimposed on stage 3b chronic kidney disease (HCC)   Atrial fibrillation, chronic (HCC)   Weakness   Hyponatremia   Pressure injury of sacral region, stage 2 (HCC)  Acute on chronic hyponatremia.  Baseline sodium of 130-135.  Most likely secondary to poor p.o. intake and the use of diuretics.  Initially requiring hypertonic saline.  Sodium improved to 131 today.  Nephrology is following-appreciate their help. -Continue to monitor sodium. -Might need to stop diuretics on discharge and he can have a close follow-up with his  provider.  Increased somnolence.  Not sure whether patient had a good night sleep but he appears more somnolent.  ABG was checked and CO2 was within normal limit. -Continue to monitor  AKI with CKD stage IIIb.  Baseline creatinine around 1.9.  Most likely prerenal.  Creatinine started improving with IV fluid -Continue with IV fluid for another day per nephrology. -Monitor renal function -Avoid nephrotoxins  Elevated troponin/NSTEMI.  No chest pain but troponin was elevated and 600 and peaked at 4862 which can be due to NSTEMI.  He was started on heparin infusion, no plan for cardiac catheterization per cardiology at this time.  Patient was on heparin infusion for 2 days which was stopped this morning by cardiology. -Cardiology is recommending conservative management  Lactic acidosis.  Resolved  Lactic acid on admission was 3.7>>1.3 -Continue with IV fluid  Hypertension.  Blood pressure currently within goal.  Home antihypertensives are being held due to softer blood pressure on admission. -Continue holding antihypertensives and monitor.  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  Hemoglobin currently stable at 7.9.  Received 1 dose of Feraheme. -Continue iron supplement -We will get benefit with EPO -Monitor hemoglobin -Transfuse if below 7  Chronic HFpEF.  According to echocardiogram done in January 2022, EF was 50 to 55% and normal diastolic function.  Mildly decreased or within lower normal limit of EF. Patient appears dry. -Keep holding home dose of torsemide and metolazone-can discontinue on discharge with a close outpatient follow-up.  Chronic atrial fibrillation.  Rate controlled -Continue home dose of Eliquis  GERD. -Continue PPI  Type 2 diabetes mellitus with CKD stage IIIb and long-term use of insulin.  Uses 10 units of  Lantus at bedtime along with SSI at home. -Continue home dose of Lantus -Continue with SSI  History of COPD with chronic  bronchitis.  No concern of exacerbation at this time. -Continue to monitor  Objective: Vitals:   04/06/21 0438 04/06/21 0941 04/06/21 1106 04/06/21 1621  BP: (!) 158/54 (!) 162/51 139/70 (!) 141/48  Pulse: 60 92 (!) 57 67  Resp: 16 16 16 18   Temp: 98.8 F (37.1 C) 99 F (37.2 C) 98.4 F (36.9 C) 98.8 F (37.1 C)  TempSrc: Oral Oral Oral Oral  SpO2: 99% 100% 90% 100%  Weight:      Height:        Intake/Output Summary (Last 24 hours) at 04/06/2021 1701 Last data filed at 04/06/2021 1600 Gross per 24 hour  Intake 1331.58 ml  Output 1250 ml  Net 81.58 ml   Filed Weights   04/02/21 1538 04/04/21 1817  Weight: 65.8 kg 70.3 kg    Examination:  General.  Lethargic appearing elderly man, in no acute distress. Pulmonary.  Lungs clear bilaterally, normal respiratory effort. CV.  Regular rate and rhythm, no JVD, rub or murmur. Abdomen.  Soft, nontender, nondistended, BS positive. CNS.  Alert and oriented .  No focal neurologic deficit. Extremities.  No edema, no cyanosis, pulses intact and symmetrical. Psychiatry.  Judgment and insight appears normal.  DVT prophylaxis: Eliquis Code Status: Full Family Communication:  Disposition Plan:  Status is: Inpatient  Remains inpatient appropriate because:Inpatient level of care appropriate due to severity of illness   Dispo: The patient is from: Home              Anticipated d/c is to: Home              Patient currently is not medically stable to d/c.   Difficult to place patient No                Level of care: Med-Surg  All the records are reviewed and case discussed with Care Management/Social Worker. Management plans discussed with the patient, nursing and they are in agreement.  Consultants:   Nephrology  Procedures:  Antimicrobials:   Data Reviewed: I have personally reviewed following labs and imaging studies  CBC: Recent Labs  Lab 04/02/21 1550 04/03/21 0357 04/04/21 0457 04/05/21 0438  WBC 17.5* 9.6 13.7*  10.9*  NEUTROABS  --  7.6  --   --   HGB 7.3* 7.2* 7.9* 7.9*  HCT 23.1* 22.8* 25.0* 25.7*  MCV 73.6* 74.3* 75.5* 76.9*  PLT 273 259 239 314   Basic Metabolic Panel: Recent Labs  Lab 04/02/21 1550 04/02/21 1832 04/03/21 0357 04/03/21 1749 04/04/21 0457 04/05/21 0438 04/06/21 0857  NA 115*   < > 122* 123* 127* 131* 130*  K 5.0  --  4.5  --  4.3 3.9 4.4  CL 73*  --  83*  --  91* 96* 96*  CO2 20*  --  26  --  22 24 25   GLUCOSE 192*  --  111*  --  136* 134* 130*  BUN 114*  --  109*  --  93* 85* 73*  CREATININE 5.48*  --  4.69*  --  3.37* 2.37* 2.07*  CALCIUM 8.5*  --  8.1*  --  8.1* 8.3* 8.2*  MG  --   --  3.4*  --   --   --   --    < > = values in this interval not displayed.   GFR: Estimated Creatinine  Clearance: 27.8 mL/min (A) (by C-G formula based on SCr of 2.07 mg/dL (H)). Liver Function Tests: Recent Labs  Lab 04/02/21 1639 04/03/21 0357  AST 44* 51*  ALT 14 15  ALKPHOS 109 101  BILITOT 0.8 0.7  PROT 6.5 6.4*  ALBUMIN 3.5 3.4*   No results for input(s): LIPASE, AMYLASE in the last 168 hours. Recent Labs  Lab 04/02/21 1639  AMMONIA <9*   Coagulation Profile: Recent Labs  Lab 04/02/21 1639  INR 1.3*   Cardiac Enzymes: No results for input(s): CKTOTAL, CKMB, CKMBINDEX, TROPONINI in the last 168 hours. BNP (last 3 results) No results for input(s): PROBNP in the last 8760 hours. HbA1C: No results for input(s): HGBA1C in the last 72 hours. CBG: Recent Labs  Lab 04/05/21 1709 04/05/21 2037 04/06/21 0744 04/06/21 1149 04/06/21 1643  GLUCAP 232* 224* 141* 162* 168*   Lipid Profile: No results for input(s): CHOL, HDL, LDLCALC, TRIG, CHOLHDL, LDLDIRECT in the last 72 hours. Thyroid Function Tests: No results for input(s): TSH, T4TOTAL, FREET4, T3FREE, THYROIDAB in the last 72 hours. Anemia Panel: No results for input(s): VITAMINB12, FOLATE, FERRITIN, TIBC, IRON, RETICCTPCT in the last 72 hours. Sepsis Labs: Recent Labs  Lab 04/02/21 1639  04/03/21 1749 04/03/21 2040  LATICACIDVEN 3.7* 1.3 1.3    Recent Results (from the past 240 hour(s))  Resp Panel by RT-PCR (Flu A&B, Covid) Nasopharyngeal Swab     Status: None   Collection Time: 04/02/21  4:39 PM   Specimen: Nasopharyngeal Swab; Nasopharyngeal(NP) swabs in vial transport medium  Result Value Ref Range Status   SARS Coronavirus 2 by RT PCR NEGATIVE NEGATIVE Final    Comment: (NOTE) SARS-CoV-2 target nucleic acids are NOT DETECTED.  The SARS-CoV-2 RNA is generally detectable in upper respiratory specimens during the acute phase of infection. The lowest concentration of SARS-CoV-2 viral copies this assay can detect is 138 copies/mL. A negative result does not preclude SARS-Cov-2 infection and should not be used as the sole basis for treatment or other patient management decisions. A negative result may occur with  improper specimen collection/handling, submission of specimen other than nasopharyngeal swab, presence of viral mutation(s) within the areas targeted by this assay, and inadequate number of viral copies(<138 copies/mL). A negative result must be combined with clinical observations, patient history, and epidemiological information. The expected result is Negative.  Fact Sheet for Patients:  EntrepreneurPulse.com.au  Fact Sheet for Healthcare Providers:  IncredibleEmployment.be  This test is no t yet approved or cleared by the [place] FDA and  has been authorized for detection and/or diagnosis of SARS-CoV-2 by FDA under an Emergency Use Authorization (EUA). This EUA will remain  in effect (meaning this test can be used) for the duration of the COVID-19 declaration under Section 564(b)(1) of the Act, 21 U.S.C.section 360bbb-3(b)(1), unless the authorization is terminated  or revoked sooner.       Influenza A by PCR NEGATIVE NEGATIVE Final   Influenza B by PCR NEGATIVE NEGATIVE Final    Comment: (NOTE) The  Xpert Xpress SARS-CoV-2/FLU/RSV plus assay is intended as an aid in the diagnosis of influenza from Nasopharyngeal swab specimens and should not be used as a sole basis for treatment. Nasal washings and aspirates are unacceptable for Xpert Xpress SARS-CoV-2/FLU/RSV testing.  Fact Sheet for Patients: EntrepreneurPulse.com.au  Fact Sheet for Healthcare Providers: IncredibleEmployment.be  This test is not yet approved or cleared by the [place] FDA and has been authorized for detection and/or diagnosis of SARS-CoV-2 by FDA under an  Emergency Use Authorization (EUA). This EUA will remain in effect (meaning this test can be used) for the duration of the COVID-19 declaration under Section 564(b)(1) of the Act, 21 U.S.C. section 360bbb-3(b)(1), unless the authorization is terminated or revoked.  Performed at [facility], [address] [place], [place] [address]   Blood culture (single)     Status: None (Preliminary result)   Collection Time: 04/02/21  4:39 PM   Specimen: BLOOD  Result Value Ref Range Status   Specimen Description BLOOD LEFT ANTECUBITAL  Final   Special Requests BOTTLES DRAWN AEROBIC AND ANAEROBIC LOW VOLUME  Final   Culture   Final    NO GROWTH 4 DAYS Performed at [facility], [address] [place], [place] [address]    Report Status PENDING  Incomplete     Radiology Studies: No results found.  Scheduled Meds: . apixaban  2.5 mg Oral BID  . clopidogrel  75 mg Oral Daily  . feeding supplement  1 Container Oral TID BM  . feeding supplement  237 mL Oral TID BM  . ferrous sulfate  325 mg Oral BID WC  . hydrocortisone  25 mg Rectal BID  . insulin aspart  0-5 Units Subcutaneous QHS  . insulin aspart  0-9 Units Subcutaneous TID WC  . insulin glargine  10 Units Subcutaneous Q2200  . multivitamin with minerals  1 tablet Oral Daily  . pantoprazole  40 mg Oral Daily  . simvastatin  20 mg Oral q AM  .  tiotropium  18 mcg Inhalation Daily  . traZODone  50 mg Oral QHS   Continuous Infusions: . sodium chloride 75 mL/hr at 04/06/21 1108     LOS: 4 days   Time spent: 30 minutes. More than 50% of the time was spent in counseling/coordination of care  Lorella Nimrod, MD [facility]  If 7PM-7AM, please contact night-coverage [email or web address]  04/06/2021, 5:01 PM   This record has been created using Systems analyst. Errors have been sought and corrected,but may not always be located. Such creation errors do not reflect on the standard of care.

## 2021-04-06 NOTE — Progress Notes (Signed)
[facility]  ROUNDING NOTE   Subjective:   [name] is admitted to [facility] on 04/02/2021 for Hyponatremia [E87.1] Weakness [R53.1] Troponin level elevated [R77.8] AKI (acute kidney injury) ([place]) [N17.9]  Hypertonic saline on admission.   Patient seen resting in bed eating breakfast Alert and able to answer questions Denies shortness of breath Currently on 4L  Objective:  Vital signs in last 24 hours:  Temp:  [97.8 F (36.6 C)-99 F (37.2 C)] 98.4 F (36.9 C) (06/04 1106) Pulse Rate:  [52-92] 57 (06/04 1106) Resp:  [16-20] 16 (06/04 1106) BP: (136-162)/(35-70) 139/70 (06/04 1106) SpO2:  [90 %-100 %] 90 % (06/04 1106)  Weight change:  Filed Weights   04/02/21 1538 04/04/21 1817  Weight: 65.8 kg 70.3 kg    Intake/Output: I/O last 3 completed shifts: In: 1603.8 [P.O.:820; I.V.:783.8] Out: 2750 [Urine:2750]   Intake/Output this shift:  No intake/output data recorded.  Physical Exam: General: NAD, laying in bed  Head: Normocephalic, atraumatic. Dry oral mucosal membranes  Eyes: Anicteric  Lungs:  Clear to auscultation, 4L [place]  Heart: Regular rate and rhythm  Abdomen:  Soft, nontender,   Extremities: no peripheral edema.  Neurologic: Alert following commands  Skin: No lesions       Basic Metabolic Panel: Recent Labs  Lab 04/02/21 1550 04/02/21 1832 04/03/21 0357 04/03/21 1749 04/04/21 0457 04/05/21 0438 04/06/21 0857  NA 115*   < > 122* 123* 127* 131* 130*  K 5.0  --  4.5  --  4.3 3.9 4.4  CL 73*  --  83*  --  91* 96* 96*  CO2 20*  --  26  --  22 24 25   GLUCOSE 192*  --  111*  --  136* 134* 130*  BUN 114*  --  109*  --  93* 85* 73*  CREATININE 5.48*  --  4.69*  --  3.37* 2.37* 2.07*  CALCIUM 8.5*  --  8.1*  --  8.1* 8.3* 8.2*  MG  --   --  3.4*  --   --   --   --    < > = values in this interval not displayed.    Liver Function Tests: Recent Labs  Lab 04/02/21 1639 04/03/21 0357  AST 44* 51*  ALT 14 15  ALKPHOS 109 101   BILITOT 0.8 0.7  PROT 6.5 6.4*  ALBUMIN 3.5 3.4*   No results for input(s): LIPASE, AMYLASE in the last 168 hours. Recent Labs  Lab 04/02/21 1639  AMMONIA <9*    CBC: Recent Labs  Lab 04/02/21 1550 04/03/21 0357 04/04/21 0457 04/05/21 0438  WBC 17.5* 9.6 13.7* 10.9*  NEUTROABS  --  7.6  --   --   HGB 7.3* 7.2* 7.9* 7.9*  HCT 23.1* 22.8* 25.0* 25.7*  MCV 73.6* 74.3* 75.5* 76.9*  PLT 273 259 239 280    Cardiac Enzymes: No results for input(s): CKTOTAL, CKMB, CKMBINDEX, TROPONINI in the last 168 hours.  BNP: Invalid input(s): POCBNP  CBG: Recent Labs  Lab 04/05/21 0829 04/05/21 1247 04/05/21 1709 04/05/21 2037 04/06/21 0744  GLUCAP 123* 142* 232* 224* 141*    Microbiology: Results for orders placed or performed during the hospital encounter of 04/02/21  Resp Panel by RT-PCR (Flu A&B, Covid) Nasopharyngeal Swab     Status: None   Collection Time: 04/02/21  4:39 PM   Specimen: Nasopharyngeal Swab; Nasopharyngeal(NP) swabs in vial transport medium  Result Value Ref Range Status   SARS Coronavirus 2  by RT PCR NEGATIVE NEGATIVE Final    Comment: (NOTE) SARS-CoV-2 target nucleic acids are NOT DETECTED.  The SARS-CoV-2 RNA is generally detectable in upper respiratory specimens during the acute phase of infection. The lowest concentration of SARS-CoV-2 viral copies this assay can detect is 138 copies/mL. A negative result does not preclude SARS-Cov-2 infection and should not be used as the sole basis for treatment or other patient management decisions. A negative result may occur with  improper specimen collection/handling, submission of specimen other than nasopharyngeal swab, presence of viral mutation(s) within the areas targeted by this assay, and inadequate number of viral copies(<138 copies/mL). A negative result must be combined with clinical observations, patient history, and epidemiological information. The expected result is Negative.  Fact Sheet for  Patients:  EntrepreneurPulse.com.au  Fact Sheet for Healthcare Providers:  IncredibleEmployment.be  This test is no t yet approved or cleared by the [place] FDA and  has been authorized for detection and/or diagnosis of SARS-CoV-2 by FDA under an Emergency Use Authorization (EUA). This EUA will remain  in effect (meaning this test can be used) for the duration of the COVID-19 declaration under Section 564(b)(1) of the Act, 21 U.S.C.section 360bbb-3(b)(1), unless the authorization is terminated  or revoked sooner.       Influenza A by PCR NEGATIVE NEGATIVE Final   Influenza B by PCR NEGATIVE NEGATIVE Final    Comment: (NOTE) The Xpert Xpress SARS-CoV-2/FLU/RSV plus assay is intended as an aid in the diagnosis of influenza from Nasopharyngeal swab specimens and should not be used as a sole basis for treatment. Nasal washings and aspirates are unacceptable for Xpert Xpress SARS-CoV-2/FLU/RSV testing.  Fact Sheet for Patients: EntrepreneurPulse.com.au  Fact Sheet for Healthcare Providers: IncredibleEmployment.be  This test is not yet approved or cleared by the [place] FDA and has been authorized for detection and/or diagnosis of SARS-CoV-2 by FDA under an Emergency Use Authorization (EUA). This EUA will remain in effect (meaning this test can be used) for the duration of the COVID-19 declaration under Section 564(b)(1) of the Act, 21 U.S.C. section 360bbb-3(b)(1), unless the authorization is terminated or revoked.  Performed at [facility], [address] [place], [place] [address]   Blood culture (single)     Status: None (Preliminary result)   Collection Time: 04/02/21  4:39 PM   Specimen: BLOOD  Result Value Ref Range Status   Specimen Description BLOOD LEFT ANTECUBITAL  Final   Special Requests BOTTLES DRAWN AEROBIC AND ANAEROBIC LOW VOLUME  Final   Culture   Final    NO  GROWTH 4 DAYS Performed at [facility], [address] [place], [place] [address]    Report Status PENDING  Incomplete    Coagulation Studies: No results for input(s): LABPROT, INR in the last 72 hours.  Urinalysis: No results for input(s): COLORURINE, LABSPEC, PHURINE, GLUCOSEU, HGBUR, BILIRUBINUR, KETONESUR, PROTEINUR, UROBILINOGEN, NITRITE, LEUKOCYTESUR in the last 72 hours.  Invalid input(s): APPERANCEUR    Imaging: No results found.   Medications:   . sodium chloride 75 mL/hr at 04/06/21 1108   . apixaban  2.5 mg Oral BID  . clopidogrel  75 mg Oral Daily  . feeding supplement  1 Container Oral TID BM  . feeding supplement  237 mL Oral TID BM  . ferrous sulfate  325 mg Oral BID WC  . hydrocortisone  25 mg Rectal BID  . insulin aspart  0-5 Units Subcutaneous QHS  . insulin aspart  0-9 Units Subcutaneous TID  WC  . insulin glargine  10 Units Subcutaneous Q2200  . multivitamin with minerals  1 tablet Oral Daily  . pantoprazole  40 mg Oral Daily  . simvastatin  20 mg Oral q AM  . tiotropium  18 mcg Inhalation Daily  . traZODone  50 mg Oral QHS   acetaminophen **OR** acetaminophen, ipratropium-albuterol, LORazepam, ondansetron **OR** ondansetron (ZOFRAN) IV, polyethylene glycol  Assessment/ Plan:  [name] is a 74 y.o. white male with CVA, prostate cancer, peripheral vascular disease, hypertension, hyperlipidemia, GERD, diabetes mellitus type II, COPD, congestive heart failure, atrial fibrillation who is admitted to [facility] on 04/02/2021 for Hyponatremia [E87.1] Weakness [R53.1] Troponin level elevated [R77.8] AKI (acute kidney injury) ([place]) [N17.9]  1. Hyponatremia: acute on chronic. Baseline of 130-135.  Poor PO intake and continued use of metalozone.  Emergent hypertonic saline administered on admission Remains hypovolumic - Continue IVF for another 24 hours - appetite improving   2. Acute kidney injury: on chronic kidney disease stage IIIB with  history of proteinuria. Baseline creatinine of 1.9, GFR of 35 on 12/14/2020.  AKI consistent with prerenal azotemia No IV contrast exposure.  Chronic Kidney Disease secondary to diabetic nephropathy and hypertension.  - Renal function continues to improve - Will monitor  3. Hypertension: hypovolumic. Holding home medications. Echocardiogram from 11/15/2020 with normal diastolic and systolic function.  Holding home regimen of metolazone and torsemide.   4.  Anemia with renal failure: hemoglobin 7.9, microcytic. Consistent with iron deficiency    LOS: 4   6/4/202211:36 AM

## 2021-04-06 NOTE — TOC Progression Note (Addendum)
Transition of Care [facility]) - Progression Note    Patient Details  Name: [name] MRN: [id number] Date of Birth: 06-Feb-1946  Transition of Care [facility]) CM/SW [place], LCSW Phone Number: 04/06/2021, 9:20 AM  Clinical Narrative:     Restarted insurance auth for [facility] and [facility] with [employer] Plan for DC to [facility] on Monday.  4:05- [employer] approved [facility] and [facility] auth for Monday. TOC handoff updated.   Expected Discharge Plan: Skilled Nursing Facility Barriers to Discharge: Continued Medical Work up  Expected Discharge Plan and Services Expected Discharge Plan: [place]       Living arrangements for the past 2 months: Single Family Home                                       Social Determinants of Health (SDOH) Interventions    Readmission Risk Interventions Readmission Risk Prevention Plan 04/05/2021 02/04/2021 10/28/2020  Transportation Screening Complete Complete Complete  PCP or Specialist Appt within 3-5 Days - - -  HRI or [place] - - -  Medication Review (RN Care Manager) Complete Complete Complete  PCP or Specialist appointment within 3-5 days of discharge Complete Complete Complete  HRI or Home Care Consult Complete Complete Complete  SW Recovery Care/Counseling Consult Complete Complete -  Palliative Care Screening Not Applicable Not Applicable Not Applicable  Skilled Nursing Facility Complete Not Applicable Not Applicable  Some recent data might be hidden

## 2021-04-07 ENCOUNTER — Inpatient Hospital Stay: Payer: PPO

## 2021-04-07 LAB — CBC
HCT: 25.3 % — ABNORMAL LOW (ref 39.0–52.0)
Hemoglobin: 7.6 g/dL — ABNORMAL LOW (ref 13.0–17.0)
MCH: 23.6 pg — ABNORMAL LOW (ref 26.0–34.0)
MCHC: 30 g/dL (ref 30.0–36.0)
MCV: 78.6 fL — ABNORMAL LOW (ref 80.0–100.0)
Platelets: 270 10*3/uL (ref 150–400)
RBC: 3.22 MIL/uL — ABNORMAL LOW (ref 4.22–5.81)
RDW: 20.8 % — ABNORMAL HIGH (ref 11.5–15.5)
WBC: 11 10*3/uL — ABNORMAL HIGH (ref 4.0–10.5)
nRBC: 0 % (ref 0.0–0.2)

## 2021-04-07 LAB — GLUCOSE, CAPILLARY
Glucose-Capillary: 153 mg/dL — ABNORMAL HIGH (ref 70–99)
Glucose-Capillary: 203 mg/dL — ABNORMAL HIGH (ref 70–99)
Glucose-Capillary: 277 mg/dL — ABNORMAL HIGH (ref 70–99)
Glucose-Capillary: 187 mg/dL — ABNORMAL HIGH (ref 70–99)

## 2021-04-07 LAB — BASIC METABOLIC PANEL
Anion gap: 7 (ref 5–15)
BUN: 63 mg/dL — ABNORMAL HIGH (ref 8–23)
CO2: 25 mmol/L (ref 22–32)
Calcium: 8.5 mg/dL — ABNORMAL LOW (ref 8.9–10.3)
Chloride: 102 mmol/L (ref 98–111)
Creatinine, Ser: 1.89 mg/dL — ABNORMAL HIGH (ref 0.61–1.24)
GFR, Estimated: 37 mL/min — ABNORMAL LOW (ref >60)
Glucose, Bld: 162 mg/dL — ABNORMAL HIGH (ref 70–99)
Potassium: 4.8 mmol/L (ref 3.5–5.1)
Sodium: 134 mmol/L — ABNORMAL LOW (ref 135–145)

## 2021-04-07 LAB — CULTURE, BLOOD (SINGLE): Culture: NO GROWTH

## 2021-04-07 LAB — PREPARE RBC (CROSSMATCH)

## 2021-04-07 MED ORDER — FUROSEMIDE 10 MG/ML IJ SOLN
40.0000 mg | Freq: Once | INTRAMUSCULAR | Status: AC
  Administered 2021-04-07: 40 mg via INTRAVENOUS
  Filled 2021-04-07: qty 4

## 2021-04-07 MED ORDER — SODIUM CHLORIDE 0.9 % IV SOLN: 200.0000 mg | Freq: Once | INTRAVENOUS | Status: DC

## 2021-04-07 MED ORDER — SODIUM CHLORIDE 0.9% IV SOLUTION: Freq: Once | INTRAVENOUS | Status: AC

## 2021-04-07 NOTE — TOC Progression Note (Signed)
Transition of Care [facility]) - Progression Note    Patient Details  Name: [name] MRN: [id number] Date of Birth: 1946/05/12  Transition of Care [facility]) CM/SW [place], LCSW Phone Number: 04/07/2021, 9:34 AM  Clinical Narrative:   Plan for SNF tomorrow. Asked for COVID test today in preparation.    Expected Discharge Plan: Skilled Nursing Facility Barriers to Discharge: Continued Medical Work up  Expected Discharge Plan and Services Expected Discharge Plan: [place]       Living arrangements for the past 2 months: Single Family Home                                       Social Determinants of Health (SDOH) Interventions    Readmission Risk Interventions Readmission Risk Prevention Plan 04/05/2021 02/04/2021 10/28/2020  Transportation Screening Complete Complete Complete  PCP or Specialist Appt within 3-5 Days - - -  HRI or [place] - - -  Medication Review (RN Care Manager) Complete Complete Complete  PCP or Specialist appointment within 3-5 days of discharge Complete Complete Complete  HRI or Home Care Consult Complete Complete Complete  SW Recovery Care/Counseling Consult Complete Complete -  Palliative Care Screening Not Applicable Not Applicable Not Applicable  Skilled Nursing Facility Complete Not Applicable Not Applicable  Some recent data might be hidden

## 2021-04-07 NOTE — Progress Notes (Addendum)
[facility]  ROUNDING NOTE   Subjective:   [name] is admitted to [facility] on 04/02/2021 for Hyponatremia [E87.1] Weakness [R53.1] Troponin level elevated [R77.8] AKI (acute kidney injury) ([place]) [N17.9]  Hypertonic saline on admission.   Patient seen resting in bed  Alert and able to answer questions Tolerating meals Currently weaned to 3L Denies shortness of breath Patient seen later complaining to staff about difficulty breathing  Objective:  Vital signs in last 24 hours:  Temp:  [98.3 F (36.8 C)-99 F (37.2 C)] 98.4 F (36.9 C) (06/05 0845) Pulse Rate:  [57-92] 65 (06/05 0845) Resp:  [16-20] 20 (06/05 0845) BP: (138-172)/(48-71) 158/71 (06/05 0845) SpO2:  [90 %-100 %] 96 % (06/05 0845)  Weight change:  Filed Weights   04/02/21 1538 04/04/21 1817  Weight: 65.8 kg 70.3 kg    Intake/Output: I/O last 3 completed shifts: In: 2204.1 [I.V.:2204.1] Out: 1800 [Urine:1800]   Intake/Output this shift:  No intake/output data recorded.  Physical Exam: General: NAD, laying in bed  Head: Normocephalic, atraumatic. Dry oral mucosal membranes  Eyes: Anicteric  Lungs:  Crackles on auscultation, 3L [place]  Heart: Regular rate and rhythm  Abdomen:  Soft, nontender,   Extremities: trace peripheral edema.  Neurologic: Alert following commands  Skin: No lesions       Basic Metabolic Panel: Recent Labs  Lab 04/03/21 0357 04/03/21 1749 04/04/21 0457 04/05/21 0438 04/06/21 0857 04/07/21 0452  NA 122* 123* 127* 131* 130* 134*  K 4.5  --  4.3 3.9 4.4 4.8  CL 83*  --  91* 96* 96* 102  CO2 26  --  22 24 25 25   GLUCOSE 111*  --  136* 134* 130* 162*  BUN 109*  --  93* 85* 73* 63*  CREATININE 4.69*  --  3.37* 2.37* 2.07* 1.89*  CALCIUM 8.1*  --  8.1* 8.3* 8.2* 8.5*  MG 3.4*  --   --   --   --   --     Liver Function Tests: Recent Labs  Lab 04/02/21 1639 04/03/21 0357  AST 44* 51*  ALT 14 15  ALKPHOS 109 101  BILITOT 0.8 0.7  PROT 6.5 6.4*  ALBUMIN  3.5 3.4*   No results for input(s): LIPASE, AMYLASE in the last 168 hours. Recent Labs  Lab 04/02/21 1639  AMMONIA <9*    CBC: Recent Labs  Lab 04/02/21 1550 04/03/21 0357 04/04/21 0457 04/05/21 0438 04/07/21 0452  WBC 17.5* 9.6 13.7* 10.9* 11.0*  NEUTROABS  --  7.6  --   --   --   HGB 7.3* 7.2* 7.9* 7.9* 7.6*  HCT 23.1* 22.8* 25.0* 25.7* 25.3*  MCV 73.6* 74.3* 75.5* 76.9* 78.6*  PLT 273 259 239 280 270    Cardiac Enzymes: No results for input(s): CKTOTAL, CKMB, CKMBINDEX, TROPONINI in the last 168 hours.  BNP: Invalid input(s): POCBNP  CBG: Recent Labs  Lab 04/06/21 0744 04/06/21 1149 04/06/21 1643 04/06/21 2138 04/07/21 0739  GLUCAP 141* 162* 168* 244* 153*    Microbiology: Results for orders placed or performed during the hospital encounter of 04/02/21  Resp Panel by RT-PCR (Flu A&B, Covid) Nasopharyngeal Swab     Status: None   Collection Time: 04/02/21  4:39 PM   Specimen: Nasopharyngeal Swab; Nasopharyngeal(NP) swabs in vial transport medium  Result Value Ref Range Status   SARS Coronavirus 2 by RT PCR NEGATIVE NEGATIVE Final    Comment: (NOTE) SARS-CoV-2 target nucleic acids are NOT DETECTED.  The SARS-CoV-2  RNA is generally detectable in upper respiratory specimens during the acute phase of infection. The lowest concentration of SARS-CoV-2 viral copies this assay can detect is 138 copies/mL. A negative result does not preclude SARS-Cov-2 infection and should not be used as the sole basis for treatment or other patient management decisions. A negative result may occur with  improper specimen collection/handling, submission of specimen other than nasopharyngeal swab, presence of viral mutation(s) within the areas targeted by this assay, and inadequate number of viral copies(<138 copies/mL). A negative result must be combined with clinical observations, patient history, and epidemiological information. The expected result is Negative.  Fact Sheet  for Patients:  EntrepreneurPulse.com.au  Fact Sheet for Healthcare Providers:  IncredibleEmployment.be  This test is no t yet approved or cleared by the [place] FDA and  has been authorized for detection and/or diagnosis of SARS-CoV-2 by FDA under an Emergency Use Authorization (EUA). This EUA will remain  in effect (meaning this test can be used) for the duration of the COVID-19 declaration under Section 564(b)(1) of the Act, 21 U.S.C.section 360bbb-3(b)(1), unless the authorization is terminated  or revoked sooner.       Influenza A by PCR NEGATIVE NEGATIVE Final   Influenza B by PCR NEGATIVE NEGATIVE Final    Comment: (NOTE) The Xpert Xpress SARS-CoV-2/FLU/RSV plus assay is intended as an aid in the diagnosis of influenza from Nasopharyngeal swab specimens and should not be used as a sole basis for treatment. Nasal washings and aspirates are unacceptable for Xpert Xpress SARS-CoV-2/FLU/RSV testing.  Fact Sheet for Patients: EntrepreneurPulse.com.au  Fact Sheet for Healthcare Providers: IncredibleEmployment.be  This test is not yet approved or cleared by the [place] FDA and has been authorized for detection and/or diagnosis of SARS-CoV-2 by FDA under an Emergency Use Authorization (EUA). This EUA will remain in effect (meaning this test can be used) for the duration of the COVID-19 declaration under Section 564(b)(1) of the Act, 21 U.S.C. section 360bbb-3(b)(1), unless the authorization is terminated or revoked.  Performed at [facility], [address] [place], [place] [address]   Blood culture (single)     Status: None   Collection Time: 04/02/21  4:39 PM   Specimen: BLOOD  Result Value Ref Range Status   Specimen Description BLOOD LEFT ANTECUBITAL  Final   Special Requests BOTTLES DRAWN AEROBIC AND ANAEROBIC LOW VOLUME  Final   Culture   Final    NO GROWTH 5  DAYS Performed at [facility], [address], [place], [place] [address]    Report Status 04/07/2021 FINAL  Final    Coagulation Studies: No results for input(s): LABPROT, INR in the last 72 hours.  Urinalysis: No results for input(s): COLORURINE, LABSPEC, PHURINE, GLUCOSEU, HGBUR, BILIRUBINUR, KETONESUR, PROTEINUR, UROBILINOGEN, NITRITE, LEUKOCYTESUR in the last 72 hours.  Invalid input(s): APPERANCEUR    Imaging: No results found.   Medications:   . sodium chloride 75 mL/hr at 04/07/21 0338   . sodium chloride   Intravenous Once  . apixaban  2.5 mg Oral BID  . clopidogrel  75 mg Oral Daily  . feeding supplement  1 Container Oral TID BM  . feeding supplement  237 mL Oral TID BM  . ferrous sulfate  325 mg Oral BID WC  . hydrocortisone  25 mg Rectal BID  . insulin aspart  0-5 Units Subcutaneous QHS  . insulin aspart  0-9 Units Subcutaneous TID WC  . insulin glargine  10 Units Subcutaneous Q2200  . multivitamin with  minerals  1 tablet Oral Daily  . pantoprazole  40 mg Oral Daily  . simvastatin  20 mg Oral q AM  . tiotropium  18 mcg Inhalation Daily  . traZODone  50 mg Oral QHS   acetaminophen **OR** acetaminophen, ipratropium-albuterol, LORazepam, ondansetron **OR** ondansetron (ZOFRAN) IV, polyethylene glycol  Assessment/ Plan:  [name] is a 75 y.o. white male with CVA, prostate cancer, peripheral vascular disease, hypertension, hyperlipidemia, GERD, diabetes mellitus type II, COPD, congestive heart failure, atrial fibrillation who is admitted to [facility] on 04/02/2021 for Hyponatremia [E87.1] Weakness [R53.1] Troponin level elevated [R77.8] AKI (acute kidney injury) ([place]) [N17.9]  1. Hyponatremia: acute on chronic. Baseline of 130-135.  Poor PO intake and continued use of metalozone.  Emergent hypertonic saline administered on admission Remains hypovolumic - Sodium 134 - d/c IVF - continue to encourage oral intake  2. Acute kidney injury: on  chronic kidney disease stage IIIB with history of proteinuria. Baseline creatinine of 1.9, GFR of 35 on 12/14/2020.  AKI consistent with prerenal azotemia No IV contrast exposure.  Chronic Kidney Disease secondary to diabetic nephropathy and hypertension.  - Renal function returned to baseline - Will monitor  3. Hypertension: hypovolumic. Holding home medications. Echocardiogram from 11/15/2020 with normal diastolic and systolic function.  Holding home regimen of metolazone and torsemide.   4.  Anemia with renal failure: hemoglobin 7.6, microcytic. Consistent with iron deficiency  5. Dyspnea due to IV fluid overload - D/c IVF - Lasix 40 mg iV once - Portable chest X-ray   LOS: 5   6/5/20229:30 AM

## 2021-04-07 NOTE — Progress Notes (Signed)
PROGRESS NOTE    [name]  RCV:[id number] DOB: 10-Mar-1946 DOA: 04/02/2021 PCP: [name], MD   Brief Narrative: Taken from H&P. 75 year old male with past medical history of chronic kidney disease stage IIIb, diastolic congestive heart failure (Echo 11/2020 EF 50-55%), diabetes mellitus type 2, coronary artery disease status post CABG presented to [facility] emergency department with complaints of weakness. He was found to have hyponatremia with sodium of 115 with AKI.  Initially started on hypertonic saline, which was switched with normal saline today by nephrology.  Of note, patient was recently hospitalized at [facility] from 4/19 until 4/30.  Patient was admitted for acute kidney injury with ATN with volume overload.  Patient required a furosemide infusion for portion of the hospitalization and was eventually transitioned over to a combination of torsemide and metolazone at time of discharge.  Patient was eventually discharged on 4/30.  Markedly elevated troponin with concern of NSTEMI, cardiology is recommending conservative management. PT is recommending SNF placement. COVID testing ordered today with anticipated discharge tomorrow.  Subjective: Patient continued to appear somnolent, mildly increased work of breathing.  Apparently did not slept the whole night.  Assessment & Plan:   Active Problems:   Chronic systolic heart failure (HCC)   Essential hypertension   COPD (chronic obstructive pulmonary disease) with chronic bronchitis (HCC)   Acute renal failure superimposed on stage 3b chronic kidney disease (HCC)   Atrial fibrillation, chronic (HCC)   Weakness   Hyponatremia   Pressure injury of sacral region, stage 2 (HCC)  Acute on chronic hyponatremia.  Baseline sodium of 130-135.  Most likely secondary to poor p.o. intake and the use of diuretics.  Initially requiring hypertonic saline.  Corrected sodium improved to 135 today.  Nephrology is following-appreciate their  help. -Continue to monitor sodium. -Might need to stop diuretics on discharge and he can have a close follow-up with his provider. -Discontinue IV fluid  Increased somnolence.   ABG was checked yesterday due to increased somnolence and CO2 was within normal limit.  Apparently he was not getting good night sleep. -Continue to monitor  AKI with CKD stage IIIb.  Baseline creatinine around 1.9.  Most likely prerenal.  Creatinine started improving with IV fluid.  Currently at 1.80 -Stop IV fluid as he was having some crackles today. -Getting some IV Lasix by nephrology -Monitor renal function -Avoid nephrotoxins  Elevated troponin/NSTEMI.  No chest pain but troponin was elevated and 600 and peaked at 4862 which can be due to NSTEMI.  He was started on heparin infusion, no plan for cardiac catheterization per cardiology at this time.  Received 48 hours of heparin infusion. -Cardiology is recommending conservative management  Lactic acidosis.  Resolved  Lactic acid on admission was 3.7>>1.3 -Continue with IV fluid  Hypertension.  Blood pressure currently within goal.  Home antihypertensives are being held due to softer blood pressure on admission. -Continue holding antihypertensives and monitor.  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  Hemoglobin currently  at 7.6 today.  Received 1 dose of Feraheme. -Give him 1 unit of PRBC-as his goal should be 8 due to recent NSTEMI -Continue iron supplement -We will get benefit with EPO -Monitor hemoglobin -Transfuse if below 8  Chronic HFpEF.  According to echocardiogram done in January 2022, EF was 50 to 55% and normal diastolic function.  Mildly decreased or within lower normal limit of EF. Patient was having few basal crackles and mildly increased work of breathing. -Nephrology is giving IV  Lasix -Keep holding home dose of torsemide and metolazone-can discontinue or decrease dose on discharge with a close outpatient  follow-up.  Chronic atrial fibrillation.  Rate controlled -Continue home dose of Eliquis  GERD. -Continue PPI  Type 2 diabetes mellitus with CKD stage IIIb and long-term use of insulin.  Uses 10 units of Lantus at bedtime along with SSI at home. -Continue home dose of Lantus -Continue with SSI  History of COPD with chronic bronchitis.  No concern of exacerbation at this time. -Continue to monitor  Objective: Vitals:   04/06/21 1621 04/06/21 2021 04/07/21 0457 04/07/21 0513  BP: (!) 141/48 (!) 138/50 (!) 172/54 (!) 148/52  Pulse: 67 66 62   Resp: 18 18 20    Temp: 98.8 F (37.1 C) 98.4 F (36.9 C) 98.3 F (36.8 C)   TempSrc: Oral Oral Oral   SpO2: 100% 100% 94%   Weight:      Height:        Intake/Output Summary (Last 24 hours) at 04/07/2021 0739 Last data filed at 04/07/2021 5329 Gross per 24 hour  Intake 1564.18 ml  Output 550 ml  Net 1014.18 ml   Filed Weights   04/02/21 1538 04/04/21 1817  Weight: 65.8 kg 70.3 kg    Examination:  General.  Frail elderly man, in no acute distress. Pulmonary.  Few scattered crackles, mildly increased work of breathing. CV.  Regular rate and rhythm, no JVD, rub or murmur. Abdomen.  Soft, nontender, nondistended, BS positive. CNS.  Alert and oriented .  No focal neurologic deficit. Extremities.  No edema, no cyanosis, pulses intact and symmetrical. Psychiatry.  Judgment and insight appears normal.  DVT prophylaxis: Eliquis Code Status: Full Family Communication: Wife was updated on phone. Disposition Plan:  Status is: Inpatient  Remains inpatient appropriate because:Inpatient level of care appropriate due to severity of illness   Dispo: The patient is from: Home              Anticipated d/c is to: Home              Patient currently is not medically stable to d/c.   Difficult to place patient No                Level of care: Med-Surg  All the records are reviewed and case discussed with Care Management/Social  Worker. Management plans discussed with the patient, nursing and they are in agreement.  Consultants:   Nephrology  Procedures:  Antimicrobials:   Data Reviewed: I have personally reviewed following labs and imaging studies  CBC: Recent Labs  Lab 04/02/21 1550 04/03/21 0357 04/04/21 0457 04/05/21 0438 04/07/21 0452  WBC 17.5* 9.6 13.7* 10.9* 11.0*  NEUTROABS  --  7.6  --   --   --   HGB 7.3* 7.2* 7.9* 7.9* 7.6*  HCT 23.1* 22.8* 25.0* 25.7* 25.3*  MCV 73.6* 74.3* 75.5* 76.9* 78.6*  PLT 273 259 239 280 924   Basic Metabolic Panel: Recent Labs  Lab 04/03/21 0357 04/03/21 1749 04/04/21 0457 04/05/21 0438 04/06/21 0857 04/07/21 0452  NA 122* 123* 127* 131* 130* 134*  K 4.5  --  4.3 3.9 4.4 4.8  CL 83*  --  91* 96* 96* 102  CO2 26  --  22 24 25 25   GLUCOSE 111*  --  136* 134* 130* 162*  BUN 109*  --  93* 85* 73* 63*  CREATININE 4.69*  --  3.37* 2.37* 2.07* 1.89*  CALCIUM 8.1*  --  8.1*  8.3* 8.2* 8.5*  MG 3.4*  --   --   --   --   --    GFR: Estimated Creatinine Clearance: 30.5 mL/min (A) (by C-G formula based on SCr of 1.89 mg/dL (H)). Liver Function Tests: Recent Labs  Lab 04/02/21 1639 04/03/21 0357  AST 44* 51*  ALT 14 15  ALKPHOS 109 101  BILITOT 0.8 0.7  PROT 6.5 6.4*  ALBUMIN 3.5 3.4*   No results for input(s): LIPASE, AMYLASE in the last 168 hours. Recent Labs  Lab 04/02/21 1639  AMMONIA <9*   Coagulation Profile: Recent Labs  Lab 04/02/21 1639  INR 1.3*   Cardiac Enzymes: No results for input(s): CKTOTAL, CKMB, CKMBINDEX, TROPONINI in the last 168 hours. BNP (last 3 results) No results for input(s): PROBNP in the last 8760 hours. HbA1C: No results for input(s): HGBA1C in the last 72 hours. CBG: Recent Labs  Lab 04/05/21 2037 04/06/21 0744 04/06/21 1149 04/06/21 1643 04/06/21 2138  GLUCAP 224* 141* 162* 168* 244*   Lipid Profile: No results for input(s): CHOL, HDL, LDLCALC, TRIG, CHOLHDL, LDLDIRECT in the last 72  hours. Thyroid Function Tests: No results for input(s): TSH, T4TOTAL, FREET4, T3FREE, THYROIDAB in the last 72 hours. Anemia Panel: No results for input(s): VITAMINB12, FOLATE, FERRITIN, TIBC, IRON, RETICCTPCT in the last 72 hours. Sepsis Labs: Recent Labs  Lab 04/02/21 1639 04/03/21 1749 04/03/21 2040  LATICACIDVEN 3.7* 1.3 1.3    Recent Results (from the past 240 hour(s))  Resp Panel by RT-PCR (Flu A&B, Covid) Nasopharyngeal Swab     Status: None   Collection Time: 04/02/21  4:39 PM   Specimen: Nasopharyngeal Swab; Nasopharyngeal(NP) swabs in vial transport medium  Result Value Ref Range Status   SARS Coronavirus 2 by RT PCR NEGATIVE NEGATIVE Final    Comment: (NOTE) SARS-CoV-2 target nucleic acids are NOT DETECTED.  The SARS-CoV-2 RNA is generally detectable in upper respiratory specimens during the acute phase of infection. The lowest concentration of SARS-CoV-2 viral copies this assay can detect is 138 copies/mL. A negative result does not preclude SARS-Cov-2 infection and should not be used as the sole basis for treatment or other patient management decisions. A negative result may occur with  improper specimen collection/handling, submission of specimen other than nasopharyngeal swab, presence of viral mutation(s) within the areas targeted by this assay, and inadequate number of viral copies(<138 copies/mL). A negative result must be combined with clinical observations, patient history, and epidemiological information. The expected result is Negative.  Fact Sheet for Patients:  EntrepreneurPulse.com.au  Fact Sheet for Healthcare Providers:  IncredibleEmployment.be  This test is no t yet approved or cleared by the [place] FDA and  has been authorized for detection and/or diagnosis of SARS-CoV-2 by FDA under an Emergency Use Authorization (EUA). This EUA will remain  in effect (meaning this test can be used) for the duration  of the COVID-19 declaration under Section 564(b)(1) of the Act, 21 U.S.C.section 360bbb-3(b)(1), unless the authorization is terminated  or revoked sooner.       Influenza A by PCR NEGATIVE NEGATIVE Final   Influenza B by PCR NEGATIVE NEGATIVE Final    Comment: (NOTE) The Xpert Xpress SARS-CoV-2/FLU/RSV plus assay is intended as an aid in the diagnosis of influenza from Nasopharyngeal swab specimens and should not be used as a sole basis for treatment. Nasal washings and aspirates are unacceptable for Xpert Xpress SARS-CoV-2/FLU/RSV testing.  Fact Sheet for Patients: EntrepreneurPulse.com.au  Fact Sheet for Healthcare Providers: IncredibleEmployment.be  This  test is not yet approved or cleared by the [place] and has been authorized for detection and/or diagnosis of SARS-CoV-2 by FDA under an Emergency Use Authorization (EUA). This EUA will remain in effect (meaning this test can be used) for the duration of the COVID-19 declaration under Section 564(b)(1) of the Act, 21 U.S.C. section 360bbb-3(b)(1), unless the authorization is terminated or revoked.  Performed at [facility], [address] [place], [place] [address]   Blood culture (single)     Status: None   Collection Time: 04/02/21  4:39 PM   Specimen: BLOOD  Result Value Ref Range Status   Specimen Description BLOOD LEFT ANTECUBITAL  Final   Special Requests BOTTLES DRAWN AEROBIC AND ANAEROBIC LOW VOLUME  Final   Culture   Final    NO GROWTH 5 DAYS Performed at [facility], [address], [place], [place] [address]    Report Status 04/07/2021 FINAL  Final     Radiology Studies: No results found.  Scheduled Meds: . apixaban  2.5 mg Oral BID  . clopidogrel  75 mg Oral Daily  . feeding supplement  1 Container Oral TID BM  . feeding supplement  237 mL Oral TID BM  . ferrous sulfate  325 mg Oral BID WC  . hydrocortisone  25 mg Rectal BID  .  insulin aspart  0-5 Units Subcutaneous QHS  . insulin aspart  0-9 Units Subcutaneous TID WC  . insulin glargine  10 Units Subcutaneous Q2200  . multivitamin with minerals  1 tablet Oral Daily  . pantoprazole  40 mg Oral Daily  . simvastatin  20 mg Oral q AM  . tiotropium  18 mcg Inhalation Daily  . traZODone  50 mg Oral QHS   Continuous Infusions: . sodium chloride 75 mL/hr at 04/07/21 0338  . iron sucrose       LOS: 5 days   Time spent: 30 minutes. More than 50% of the time was spent in counseling/coordination of care  Lorella Nimrod, MD [facility]  If 7PM-7AM, please contact night-coverage [email or web address]  04/07/2021, 7:39 AM   This record has been created using Systems analyst. Errors have been sought and corrected,but may not always be located. Such creation errors do not reflect on the standard of care.

## 2021-04-08 DIAGNOSIS — L89152 Pressure ulcer of sacral region, stage 2: Secondary | ICD-10-CM | POA: Diagnosis not present

## 2021-04-08 DIAGNOSIS — I6389 Other cerebral infarction: Secondary | ICD-10-CM | POA: Diagnosis not present

## 2021-04-08 DIAGNOSIS — J80 Acute respiratory distress syndrome: Secondary | ICD-10-CM | POA: Diagnosis not present

## 2021-04-08 DIAGNOSIS — I5033 Acute on chronic diastolic (congestive) heart failure: Secondary | ICD-10-CM | POA: Diagnosis not present

## 2021-04-08 DIAGNOSIS — J984 Other disorders of lung: Secondary | ICD-10-CM | POA: Diagnosis not present

## 2021-04-08 DIAGNOSIS — J9621 Acute and chronic respiratory failure with hypoxia: Secondary | ICD-10-CM | POA: Diagnosis not present

## 2021-04-08 DIAGNOSIS — J9610 Chronic respiratory failure, unspecified whether with hypoxia or hypercapnia: Secondary | ICD-10-CM | POA: Diagnosis not present

## 2021-04-08 DIAGNOSIS — I480 Paroxysmal atrial fibrillation: Secondary | ICD-10-CM | POA: Diagnosis not present

## 2021-04-08 DIAGNOSIS — J9601 Acute respiratory failure with hypoxia: Secondary | ICD-10-CM | POA: Diagnosis not present

## 2021-04-08 DIAGNOSIS — R1311 Dysphagia, oral phase: Secondary | ICD-10-CM | POA: Diagnosis not present

## 2021-04-08 DIAGNOSIS — R0602 Shortness of breath: Secondary | ICD-10-CM | POA: Diagnosis not present

## 2021-04-08 DIAGNOSIS — J159 Unspecified bacterial pneumonia: Secondary | ICD-10-CM | POA: Diagnosis not present

## 2021-04-08 DIAGNOSIS — I472 Ventricular tachycardia: Secondary | ICD-10-CM | POA: Diagnosis not present

## 2021-04-08 DIAGNOSIS — D6489 Other specified anemias: Secondary | ICD-10-CM | POA: Diagnosis not present

## 2021-04-08 DIAGNOSIS — J9611 Chronic respiratory failure with hypoxia: Secondary | ICD-10-CM | POA: Diagnosis not present

## 2021-04-08 DIAGNOSIS — E871 Hypo-osmolality and hyponatremia: Secondary | ICD-10-CM | POA: Diagnosis not present

## 2021-04-08 DIAGNOSIS — I5043 Acute on chronic combined systolic (congestive) and diastolic (congestive) heart failure: Secondary | ICD-10-CM | POA: Diagnosis not present

## 2021-04-08 DIAGNOSIS — E568 Deficiency of other vitamins: Secondary | ICD-10-CM | POA: Diagnosis not present

## 2021-04-08 DIAGNOSIS — I25810 Atherosclerosis of coronary artery bypass graft(s) without angina pectoris: Secondary | ICD-10-CM | POA: Diagnosis not present

## 2021-04-08 DIAGNOSIS — I248 Other forms of acute ischemic heart disease: Secondary | ICD-10-CM | POA: Diagnosis not present

## 2021-04-08 DIAGNOSIS — J438 Other emphysema: Secondary | ICD-10-CM | POA: Diagnosis not present

## 2021-04-08 DIAGNOSIS — Z8616 Personal history of COVID-19: Secondary | ICD-10-CM | POA: Diagnosis not present

## 2021-04-08 DIAGNOSIS — E1165 Type 2 diabetes mellitus with hyperglycemia: Secondary | ICD-10-CM | POA: Diagnosis not present

## 2021-04-08 DIAGNOSIS — R069 Unspecified abnormalities of breathing: Secondary | ICD-10-CM | POA: Diagnosis not present

## 2021-04-08 DIAGNOSIS — I491 Atrial premature depolarization: Secondary | ICD-10-CM | POA: Diagnosis not present

## 2021-04-08 DIAGNOSIS — R41841 Cognitive communication deficit: Secondary | ICD-10-CM | POA: Diagnosis not present

## 2021-04-08 DIAGNOSIS — R778 Other specified abnormalities of plasma proteins: Secondary | ICD-10-CM

## 2021-04-08 DIAGNOSIS — R2689 Other abnormalities of gait and mobility: Secondary | ICD-10-CM | POA: Diagnosis not present

## 2021-04-08 DIAGNOSIS — J811 Chronic pulmonary edema: Secondary | ICD-10-CM | POA: Diagnosis not present

## 2021-04-08 DIAGNOSIS — Z515 Encounter for palliative care: Secondary | ICD-10-CM | POA: Diagnosis not present

## 2021-04-08 DIAGNOSIS — E1142 Type 2 diabetes mellitus with diabetic polyneuropathy: Secondary | ICD-10-CM | POA: Diagnosis not present

## 2021-04-08 DIAGNOSIS — Z7189 Other specified counseling: Secondary | ICD-10-CM | POA: Diagnosis not present

## 2021-04-08 DIAGNOSIS — I5022 Chronic systolic (congestive) heart failure: Secondary | ICD-10-CM | POA: Diagnosis not present

## 2021-04-08 DIAGNOSIS — M6281 Muscle weakness (generalized): Secondary | ICD-10-CM | POA: Diagnosis not present

## 2021-04-08 DIAGNOSIS — E785 Hyperlipidemia, unspecified: Secondary | ICD-10-CM | POA: Diagnosis not present

## 2021-04-08 DIAGNOSIS — I5023 Acute on chronic systolic (congestive) heart failure: Secondary | ICD-10-CM | POA: Diagnosis not present

## 2021-04-08 DIAGNOSIS — F418 Other specified anxiety disorders: Secondary | ICD-10-CM | POA: Diagnosis not present

## 2021-04-08 DIAGNOSIS — E11649 Type 2 diabetes mellitus with hypoglycemia without coma: Secondary | ICD-10-CM | POA: Diagnosis not present

## 2021-04-08 DIAGNOSIS — E1122 Type 2 diabetes mellitus with diabetic chronic kidney disease: Secondary | ICD-10-CM | POA: Diagnosis not present

## 2021-04-08 DIAGNOSIS — Z66 Do not resuscitate: Secondary | ICD-10-CM | POA: Diagnosis not present

## 2021-04-08 DIAGNOSIS — I959 Hypotension, unspecified: Secondary | ICD-10-CM | POA: Diagnosis not present

## 2021-04-08 DIAGNOSIS — Z951 Presence of aortocoronary bypass graft: Secondary | ICD-10-CM | POA: Diagnosis not present

## 2021-04-08 DIAGNOSIS — Z8679 Personal history of other diseases of the circulatory system: Secondary | ICD-10-CM | POA: Diagnosis not present

## 2021-04-08 DIAGNOSIS — R5381 Other malaise: Secondary | ICD-10-CM | POA: Diagnosis not present

## 2021-04-08 DIAGNOSIS — Z8673 Personal history of transient ischemic attack (TIA), and cerebral infarction without residual deficits: Secondary | ICD-10-CM | POA: Diagnosis not present

## 2021-04-08 DIAGNOSIS — Z7401 Bed confinement status: Secondary | ICD-10-CM | POA: Diagnosis not present

## 2021-04-08 DIAGNOSIS — K219 Gastro-esophageal reflux disease without esophagitis: Secondary | ICD-10-CM | POA: Diagnosis not present

## 2021-04-08 DIAGNOSIS — J90 Pleural effusion, not elsewhere classified: Secondary | ICD-10-CM | POA: Diagnosis not present

## 2021-04-08 DIAGNOSIS — N1832 Chronic kidney disease, stage 3b: Secondary | ICD-10-CM | POA: Diagnosis not present

## 2021-04-08 DIAGNOSIS — I350 Nonrheumatic aortic (valve) stenosis: Secondary | ICD-10-CM | POA: Diagnosis not present

## 2021-04-08 DIAGNOSIS — I429 Cardiomyopathy, unspecified: Secondary | ICD-10-CM | POA: Diagnosis not present

## 2021-04-08 DIAGNOSIS — I10 Essential (primary) hypertension: Secondary | ICD-10-CM | POA: Diagnosis not present

## 2021-04-08 DIAGNOSIS — N183 Chronic kidney disease, stage 3 unspecified: Secondary | ICD-10-CM | POA: Diagnosis not present

## 2021-04-08 DIAGNOSIS — N179 Acute kidney failure, unspecified: Secondary | ICD-10-CM | POA: Diagnosis not present

## 2021-04-08 DIAGNOSIS — D649 Anemia, unspecified: Secondary | ICD-10-CM | POA: Diagnosis not present

## 2021-04-08 DIAGNOSIS — I160 Hypertensive urgency: Secondary | ICD-10-CM | POA: Diagnosis not present

## 2021-04-08 DIAGNOSIS — E43 Unspecified severe protein-calorie malnutrition: Secondary | ICD-10-CM | POA: Diagnosis not present

## 2021-04-08 DIAGNOSIS — E119 Type 2 diabetes mellitus without complications: Secondary | ICD-10-CM | POA: Diagnosis not present

## 2021-04-08 DIAGNOSIS — R531 Weakness: Secondary | ICD-10-CM | POA: Diagnosis not present

## 2021-04-08 DIAGNOSIS — I517 Cardiomegaly: Secondary | ICD-10-CM | POA: Diagnosis not present

## 2021-04-08 DIAGNOSIS — E1151 Type 2 diabetes mellitus with diabetic peripheral angiopathy without gangrene: Secondary | ICD-10-CM | POA: Diagnosis not present

## 2021-04-08 DIAGNOSIS — J441 Chronic obstructive pulmonary disease with (acute) exacerbation: Secondary | ICD-10-CM | POA: Diagnosis not present

## 2021-04-08 DIAGNOSIS — E440 Moderate protein-calorie malnutrition: Secondary | ICD-10-CM | POA: Diagnosis not present

## 2021-04-08 DIAGNOSIS — I4892 Unspecified atrial flutter: Secondary | ICD-10-CM | POA: Diagnosis not present

## 2021-04-08 DIAGNOSIS — I482 Chronic atrial fibrillation, unspecified: Secondary | ICD-10-CM | POA: Diagnosis not present

## 2021-04-08 DIAGNOSIS — I130 Hypertensive heart and chronic kidney disease with heart failure and stage 1 through stage 4 chronic kidney disease, or unspecified chronic kidney disease: Secondary | ICD-10-CM | POA: Diagnosis not present

## 2021-04-08 DIAGNOSIS — R279 Unspecified lack of coordination: Secondary | ICD-10-CM | POA: Diagnosis not present

## 2021-04-08 DIAGNOSIS — M6259 Muscle wasting and atrophy, not elsewhere classified, multiple sites: Secondary | ICD-10-CM | POA: Diagnosis not present

## 2021-04-08 DIAGNOSIS — F39 Unspecified mood [affective] disorder: Secondary | ICD-10-CM | POA: Diagnosis not present

## 2021-04-08 DIAGNOSIS — J9600 Acute respiratory failure, unspecified whether with hypoxia or hypercapnia: Secondary | ICD-10-CM | POA: Diagnosis not present

## 2021-04-08 DIAGNOSIS — R0902 Hypoxemia: Secondary | ICD-10-CM | POA: Diagnosis not present

## 2021-04-08 DIAGNOSIS — I499 Cardiac arrhythmia, unspecified: Secondary | ICD-10-CM | POA: Diagnosis not present

## 2021-04-08 DIAGNOSIS — J449 Chronic obstructive pulmonary disease, unspecified: Secondary | ICD-10-CM | POA: Diagnosis not present

## 2021-04-08 LAB — BPAM RBC
Blood Product Expiration Date: 202207052359
ISSUE DATE / TIME: 202206051158
Unit Type and Rh: 5100

## 2021-04-08 LAB — PHOSPHORUS: Phosphorus: 2.8 mg/dL (ref 2.5–4.6)

## 2021-04-08 LAB — GLUCOSE, CAPILLARY
Glucose-Capillary: 151 mg/dL — ABNORMAL HIGH (ref 70–99)
Glucose-Capillary: 172 mg/dL — ABNORMAL HIGH (ref 70–99)

## 2021-04-08 LAB — BASIC METABOLIC PANEL
Anion gap: 7 (ref 5–15)
BUN: 60 mg/dL — ABNORMAL HIGH (ref 8–23)
CO2: 26 mmol/L (ref 22–32)
Calcium: 8.7 mg/dL — ABNORMAL LOW (ref 8.9–10.3)
Chloride: 103 mmol/L (ref 98–111)
Creatinine, Ser: 2.03 mg/dL — ABNORMAL HIGH (ref 0.61–1.24)
GFR, Estimated: 34 mL/min — ABNORMAL LOW (ref >60)
Glucose, Bld: 150 mg/dL — ABNORMAL HIGH (ref 70–99)
Potassium: 4.5 mmol/L (ref 3.5–5.1)
Sodium: 136 mmol/L (ref 135–145)

## 2021-04-08 LAB — MAGNESIUM: Magnesium: 2.5 mg/dL — ABNORMAL HIGH (ref 1.7–2.4)

## 2021-04-08 LAB — TYPE AND SCREEN
ABO/RH(D): O POS
Antibody Screen: NEGATIVE
Unit division: 0

## 2021-04-08 LAB — SARS CORONAVIRUS 2 (TAT 6-24 HRS): SARS Coronavirus 2: NEGATIVE

## 2021-04-08 MED ORDER — FERROUS SULFATE 325 (65 FE) MG PO TABS: 325.0000 mg | ORAL_TABLET | Freq: Two times a day (BID) | ORAL | 3 refills | Status: AC

## 2021-04-08 MED ORDER — CLOPIDOGREL BISULFATE 75 MG PO TABS: 75.0000 mg | ORAL_TABLET | Freq: Every day | ORAL | Status: AC

## 2021-04-08 MED ORDER — FUROSEMIDE 10 MG/ML IJ SOLN
20.0000 mg | Freq: Once | INTRAMUSCULAR | Status: AC
  Administered 2021-04-08: 20 mg via INTRAVENOUS
  Filled 2021-04-08: qty 4

## 2021-04-08 MED ORDER — TORSEMIDE 20 MG PO TABS
20.0000 mg | ORAL_TABLET | ORAL | Status: DC
Start: 2021-04-09 — End: 2021-04-25

## 2021-04-08 MED ORDER — POLYETHYLENE GLYCOL 3350 17 G PO PACK: 17.0000 g | PACK | Freq: Every day | ORAL | 0 refills | Status: DC | PRN

## 2021-04-08 NOTE — Progress Notes (Signed)
Physical Therapy Treatment Patient Details Name: [name] MRN: [id number] DOB: [birth year] Today's Date: 04/08/2021    History of Present Illness Pt is a 75 y/o male w/ hx of CVA, PNA, PVD, HTN, HLD, GERD, DM, CAD, COPD, CHF s/p CABG, presented to ED w/ SOB. Admitted for acute hyponatremia.    PT Comments    Pt resting in bed upon PT arrival; pt appearing drowsy (pt able to talk to therapist but easily falling back asleep intermittently during session).  Pt reports not sleeping well last night; nurse reporting pt received Ativan last night.  Pt able to participate in some AAROM B LE ex's in bed but ultimately session limited d/t pt's drowsiness.  Will continue to focus on strengthening and progressive functional mobility per pt tolerance.    Follow Up Recommendations  SNF;Supervision/Assistance - 24 hour     Equipment Recommendations       Recommendations for Other Services       Precautions / Restrictions Precautions Precautions: Fall Restrictions Weight Bearing Restrictions: No    Mobility  Bed Mobility               General bed mobility comments: Deferred any OOB mobility d/t pt's drowsiness (safety concerns)    Transfers                    Ambulation/Gait                 Stairs             Wheelchair Mobility    Modified Rankin (Stroke Patients Only)       Balance                                            Cognition Arousal/Alertness: Suspect due to medications (Drowsy) Behavior During Therapy: Flat affect (d/t drowsiness) Overall Cognitive Status: Within Functional Limits for tasks assessed                                 General Comments: Able to talk with therapist when alert      Exercises General Exercises - Lower Extremity Short Arc Quad: AAROM;Strengthening;Both;10 reps;Supine Heel Slides: AAROM;Strengthening;Both;10 reps;Supine Hip ABduction/ADduction: AAROM;Strengthening;Both;10  reps;Supine    General Comments   Nursing cleared pt for participation in physical therapy.  Pt agreeable to PT session.      Pertinent Vitals/Pain Faces Pain Scale: Hurts a little bit Pain Location: general body pain Pain Descriptors / Indicators: Sore Pain Intervention(s): Limited activity within patient's tolerance;Monitored during session;Repositioned  Vitals (HR and O2 on 3 L via nasal cannula) stable and WFL throughout treatment session.    Home Living                      Prior Function            PT Goals (current goals can now be found in the care plan section) Acute Rehab PT Goals Patient Stated Goal: to improve strength PT Goal Formulation: With patient Time For Goal Achievement: 04/18/21 Potential to Achieve Goals: Good Progress towards PT goals: Progressing toward goals    Frequency    Min 2X/week      PT Plan Current plan remains appropriate    Co-evaluation  AM-PAC PT "6 Clicks" Mobility   Outcome Measure  Help needed turning from your back to your side while in a flat bed without using bedrails?: A Little Help needed moving from lying on your back to sitting on the side of a flat bed without using bedrails?: A Little Help needed moving to and from a bed to a chair (including a wheelchair)?: A Little Help needed standing up from a chair using your arms (e.g., wheelchair or bedside chair)?: A Lot Help needed to walk in hospital room?: A Lot Help needed climbing 3-5 steps with a railing? : A Lot 6 Click Score: 15    End of Session Equipment Utilized During Treatment: Oxygen Activity Tolerance: Other (comment) (Limited d/t pt's drowsiness) Patient left: in bed;with call bell/phone within reach;with bed alarm set Nurse Communication: Precautions;Other (comment) (pt's drowsiness) PT Visit Diagnosis: Unsteadiness on feet (R26.81);Other abnormalities of gait and mobility (R26.89);Repeated falls (R29.6);Muscle weakness  (generalized) (M62.81);History of falling (Z91.81);Difficulty in walking, not elsewhere classified (R26.2)     Time: 8099-8338 PT Time Calculation (min) (ACUTE ONLY): 13 min  Charges:  $Therapeutic Exercise: 8-22 mins                    Leitha Bleak, PT 04/08/21, 10:21 AM

## 2021-04-08 NOTE — TOC Transition Note (Signed)
Transition of Care [facility]) - CM/SW Discharge Note   Patient Details  Name: [name] MRN: [id number] Date of Birth: 1946/04/05  Transition of Care [facility]) CM/SW Contact:  Candie Chroman, LCSW Phone Number: 04/08/2021, 2:20 PM   Clinical Narrative: Patient has orders to discharge to [facility] today. RN will call report to [phone] (Room E4). EMS transport has been arranged for 4:00. Outpatient palliative referral made to Flo Shanks, RN with [facility]. No further concerns. CSW signing off.    Final next level of care: Skilled Nursing Facility Barriers to Discharge: Barriers Resolved   Patient Goals and CMS Choice Patient states their goals for this hospitalization and ongoing recovery are:: SNF rehab CMS Medicare.gov Compare Post Acute Care list provided to:: Patient Represenative (must comment) Choice offered to / list presented to : [facility]  Discharge Placement   Existing PASRR number confirmed : 04/05/21          Patient chooses bed at: Other - please specify in the comment section below: ([facility]) Patient to be transferred to facility by: EMS Name of family member notified: Cristino Degroff Patient and family notified of of transfer: 04/08/21  Discharge Plan and Services                                     Social Determinants of Health (SDOH) Interventions     Readmission Risk Interventions Readmission Risk Prevention Plan 04/05/2021 02/04/2021 10/28/2020  Transportation Screening Complete Complete Complete  PCP or Specialist Appt within 3-5 Days - - -  [facility]RI or [place] - - -  Medication Review ([place]) Complete Complete Complete  PCP or Specialist appointment within 3-5 days of discharge Complete Complete Complete  HRI or Home Care Consult Complete Complete Complete  SW Recovery Care/Counseling Consult Complete Complete -  Palliative Care Screening Not Applicable Not Applicable Not Applicable  Skilled Nursing  Facility Complete Not Applicable Not Applicable  Some recent data might be hidden

## 2021-04-08 NOTE — Progress Notes (Signed)
Manufacturing engineer hospital liaison note: New referral for [facility] community palliative program to follow at [facility] received from [facility]. Patient information sent to referral. Thank you for this referral. Flo Shanks BSN, RN, [place] [phone]

## 2021-04-08 NOTE — Progress Notes (Signed)
[facility]  ROUNDING NOTE   Subjective:   [name] is admitted to [facility] on 04/02/2021 for Hyponatremia [E87.1] Weakness [R53.1] Troponin level elevated [R77.8] AKI (acute kidney injury) ([place]) [N17.9]  Hypertonic saline on admission.   Patient seen resting in bed  Alert but drowsy Per nursing, received Ativan for yelling out overnight   Objective:  Vital signs in last 24 hours:  Temp:  [97.7 F (36.5 C)-99.2 F (37.3 C)] 98.2 F (36.8 C) (06/06 1129) Pulse Rate:  [52-68] 63 (06/06 1129) Resp:  [16-22] 16 (06/06 1129) BP: (152-175)/(50-79) 175/58 (06/06 1129) SpO2:  [93 %-99 %] 94 % (06/06 1129)  Weight change:  Filed Weights   04/02/21 1538 04/04/21 1817  Weight: 65.8 kg 70.3 kg    Intake/Output: I/O last 3 completed shifts: In: 1474.5 [P.O.:240; I.V.:872.5; Blood:362] Out: 1900 [[id number]]   Intake/Output this shift:  No intake/output data recorded.  Physical Exam: General: NAD, laying in bed  Head: Normocephalic, atraumatic. Dry oral mucosal membranes  Eyes: Anicteric  Lungs:  Crackles on auscultation, 3L [place]  Heart: Regular rate and rhythm  Abdomen:  Soft, nontender,   Extremities: no peripheral edema.  Neurologic: Alert following commands  Skin: No lesions       Basic Metabolic Panel: Recent Labs  Lab 04/03/21 0357 04/03/21 1749 04/04/21 0457 04/05/21 0438 04/06/21 0857 04/07/21 0452 04/08/21 0528  NA 122*   < > 127* 131* 130* 134* 136  K 4.5  --  4.3 3.9 4.4 4.8 4.5  CL 83*  --  91* 96* 96* 102 103  CO2 26  --  22 24 25 25 26   GLUCOSE 111*  --  136* 134* 130* 162* 150*  BUN 109*  --  93* 85* 73* 63* 60*  CREATININE 4.69*  --  3.37* 2.37* 2.07* 1.89* 2.03*  CALCIUM 8.1*  --  8.1* 8.3* 8.2* 8.5* 8.7*  MG 3.4*  --   --   --   --   --  2.5*  PHOS  --   --   --   --   --   --  2.8   < > = values in this interval not displayed.    Liver Function Tests: Recent Labs  Lab 04/02/21 1639 04/03/21 0357  AST 44* 51*  ALT 14  15  ALKPHOS 109 101  BILITOT 0.8 0.7  PROT 6.5 6.4*  ALBUMIN 3.5 3.4*   No results for input(s): LIPASE, AMYLASE in the last 168 hours. Recent Labs  Lab 04/02/21 1639  AMMONIA <9*    CBC: Recent Labs  Lab 04/02/21 1550 04/03/21 0357 04/04/21 0457 04/05/21 0438 04/07/21 0452  WBC 17.5* 9.6 13.7* 10.9* 11.0*  NEUTROABS  --  7.6  --   --   --   HGB 7.3* 7.2* 7.9* 7.9* 7.6*  HCT 23.1* 22.8* 25.0* 25.7* 25.3*  MCV 73.6* 74.3* 75.5* 76.9* 78.6*  PLT 273 259 239 280 270    Cardiac Enzymes: No results for input(s): CKTOTAL, CKMB, CKMBINDEX, TROPONINI in the last 168 hours.  BNP: Invalid input(s): POCBNP  CBG: Recent Labs  Lab 04/07/21 0739 04/07/21 1218 04/07/21 1631 04/07/21 2113 04/08/21 0741  GLUCAP 153* 203* 277* 187* 151*    Microbiology: Results for orders placed or performed during the hospital encounter of 04/02/21  Resp Panel by RT-PCR (Flu A&B, Covid) Nasopharyngeal Swab     Status: None   Collection Time: 04/02/21  4:39 PM   Specimen: Nasopharyngeal Swab; Nasopharyngeal(NP) swabs in  vial transport medium  Result Value Ref Range Status   SARS Coronavirus 2 by RT PCR NEGATIVE NEGATIVE Final    Comment: (NOTE) SARS-CoV-2 target nucleic acids are NOT DETECTED.  The SARS-CoV-2 RNA is generally detectable in upper respiratory specimens during the acute phase of infection. The lowest concentration of SARS-CoV-2 viral copies this assay can detect is 138 copies/mL. A negative result does not preclude SARS-Cov-2 infection and should not be used as the sole basis for treatment or other patient management decisions. A negative result may occur with  improper specimen collection/handling, submission of specimen other than nasopharyngeal swab, presence of viral mutation(s) within the areas targeted by this assay, and inadequate number of viral copies(<138 copies/mL). A negative result must be combined with clinical observations, patient history, and  epidemiological information. The expected result is Negative.  Fact Sheet for Patients:  EntrepreneurPulse.com.au  Fact Sheet for Healthcare Providers:  IncredibleEmployment.be  This test is no t yet approved or cleared by the [place] FDA and  has been authorized for detection and/or diagnosis of SARS-CoV-2 by FDA under an Emergency Use Authorization (EUA). This EUA will remain  in effect (meaning this test can be used) for the duration of the COVID-19 declaration under Section 564(b)(1) of the Act, 21 U.S.C.section 360bbb-3(b)(1), unless the authorization is terminated  or revoked sooner.       Influenza A by PCR NEGATIVE NEGATIVE Final   Influenza B by PCR NEGATIVE NEGATIVE Final    Comment: (NOTE) The Xpert Xpress SARS-CoV-2/FLU/RSV plus assay is intended as an aid in the diagnosis of influenza from Nasopharyngeal swab specimens and should not be used as a sole basis for treatment. Nasal washings and aspirates are unacceptable for Xpert Xpress SARS-CoV-2/FLU/RSV testing.  Fact Sheet for Patients: EntrepreneurPulse.com.au  Fact Sheet for Healthcare Providers: IncredibleEmployment.be  This test is not yet approved or cleared by the [place] FDA and has been authorized for detection and/or diagnosis of SARS-CoV-2 by FDA under an Emergency Use Authorization (EUA). This EUA will remain in effect (meaning this test can be used) for the duration of the COVID-19 declaration under Section 564(b)(1) of the Act, 21 U.S.C. section 360bbb-3(b)(1), unless the authorization is terminated or revoked.  Performed at [facility], [address] [place], [place] [address]   Blood culture (single)     Status: None   Collection Time: 04/02/21  4:39 PM   Specimen: BLOOD  Result Value Ref Range Status   Specimen Description BLOOD LEFT ANTECUBITAL  Final   Special Requests BOTTLES DRAWN AEROBIC  AND ANAEROBIC LOW VOLUME  Final   Culture   Final    NO GROWTH 5 DAYS Performed at [facility], [address] [place],  [address]    Report Status 04/07/2021 FINAL  Final    Coagulation Studies: No results for input(s): LABPROT, INR in the last 72 hours.  Urinalysis: No results for input(s): COLORURINE, LABSPEC, PHURINE, GLUCOSEU, HGBUR, BILIRUBINUR, KETONESUR, PROTEINUR, UROBILINOGEN, NITRITE, LEUKOCYTESUR in the last 72 hours.  Invalid input(s): APPERANCEUR    Imaging: DG Chest Port 1 View  Result Date: 04/07/2021 CLINICAL DATA:  Shortness of breath EXAM: PORTABLE CHEST 1 VIEW COMPARISON:  Apr 02, 2021 FINDINGS: There is again cardiomegaly. There is pulmonary venous hypertension. There is extensive interstitial thickening and airspace opacity, likely representing interstitial and alveolar pulmonary edema. There is a left pleural effusion. Patient is status post coronary artery bypass grafting. There is aortic atherosclerosis. No bone lesions. IMPRESSION: Cardiomegaly with pulmonary vascular  congestion. Suspected interstitial alveolar edema with left pleural effusion. The overall appearance is consistent with congestive heart failure. It should be noted that patchy superimposed pneumonia cannot be excluded in this circumstance. Status post coronary artery bypass grafting. Aortic Atherosclerosis (ICD10-I70.0). Electronically Signed   By: Lowella Grip III M.D.   On: 04/07/2021 10:32     Medications:    . apixaban  2.5 mg Oral BID  . clopidogrel  75 mg Oral Daily  . feeding supplement  1 Container Oral TID BM  . feeding supplement  237 mL Oral TID BM  . ferrous sulfate  325 mg Oral BID WC  . hydrocortisone  25 mg Rectal BID  . insulin aspart  0-5 Units Subcutaneous QHS  . insulin aspart  0-9 Units Subcutaneous TID WC  . insulin glargine  10 Units Subcutaneous Q2200  . multivitamin with minerals  1 tablet Oral Daily  . pantoprazole  40 mg Oral Daily  .  simvastatin  20 mg Oral q AM  . tiotropium  18 mcg Inhalation Daily  . traZODone  50 mg Oral QHS   acetaminophen **OR** acetaminophen, ipratropium-albuterol, LORazepam, ondansetron **OR** ondansetron (ZOFRAN) IV, polyethylene glycol  Assessment/ Plan:  Mr. Chandan Fly is a 75 y.o. white male with CVA, prostate cancer, peripheral vascular disease, hypertension, hyperlipidemia, GERD, diabetes mellitus type II, COPD, congestive heart failure, atrial fibrillation who is admitted to [facility] on 04/02/2021 for Hyponatremia [E87.1] Weakness [R53.1] Troponin level elevated [R77.8] AKI (acute kidney injury) ([place]) [N17.9]  1. Hyponatremia: acute on chronic. Baseline of 130-135.  Poor PO intake and continued use of metalozone.  Emergent hypertonic saline administered on admission Remains hypovolumic - Sodium 136 - continue to encourage oral intake  2. Acute kidney injury: on chronic kidney disease stage IIIB with history of proteinuria. Baseline creatinine of 1.9, GFR of 35 on 12/14/2020.  AKI consistent with prerenal azotemia No IV contrast exposure.  Chronic Kidney Disease secondary to diabetic nephropathy and hypertension.  - Renal function returned to baseline - Will monitor  3. Hypertension: hypovolumic. Holding home medications. Echocardiogram from 11/15/2020 with normal diastolic and systolic function.  Holding home regimen of metolazone and torsemide.   4.  Anemia with renal failure: hemoglobin 7.6, microcytic. Consistent with iron deficiency  5. Dyspnea due to IV fluid overload - Lasix 40 mg iV once yesterday - Lasix 20 mg once prior to admission - Will order low dose diuretic at discharge - Portable chest X-ray shows pulmonary vascular congestion   LOS: 6   6/6/202211:45 AM

## 2021-04-08 NOTE — Progress Notes (Signed)
Report to [place] at facility.

## 2021-04-08 NOTE — Discharge Summary (Signed)
Physician Discharge Summary  Lamari Beckles QMG:[id number] DOB: 23-May-1946 DOA: 04/02/2021  PCP: [name], MD  Admit date: 04/02/2021 Discharge date: 04/08/2021  Admitted From: Home Disposition: SNF  Recommendations for Outpatient Follow-up:  1. Follow up with PCP in 1-2 weeks 2. Follow-up with cardiology in 1 week 3. Follow-up with nephrology 4. Please obtain BMP/CBC in one week 5. Please follow up on the following pending results: None  Home Health: No Equipment/Devices: Rolling walker, home oxygen Discharge Condition: Stable CODE STATUS: Full Diet recommendation: Heart Healthy / Carb Modified   Brief/Interim Summary: 75 year old male with past medical history of chronic kidney disease stage IIIb, diastolic congestive heart failure(Echo 11/2020 EF 50-55%),diabetes mellitus type 2, coronary artery disease status post CABG presented to [facility] emergency department with complaints of weakness. He was found to have hyponatremia with sodium of 115 with AKI.  Initially started on hypertonic saline, which was switched with normal saline and continued for 3 days with improvement in sodium and AKI.  Patient appears very dehydrated on presentation and it was thought to be due to excessively being diuresed.  Patient was on torsemide and metolazone at home.  They were held during current hospitalization and patient will resume torsemide 4 times a week at a lower dose to avoid volume overload.  Patient is to follow-up closely with cardiology for further recommendations of his heart failure and CAD.  Multiple recent hospitalizations with similar complaints and worsening functional status.  Patient is have declining in his health for the past year or so.  He was discharged from rehab and came to the ED 2 days after that.  Discussed with wife that he is very high risk for deterioration and death but she would like to keep him as full code but agreeable to see palliative care as an outpatient.  Patient was  also found to have markedly elevated troponin which met the criteria for NSTEMI.  Cardiology was consulted but they were recommending conservative management.  No procedure done.  Patient will follow-up with cardiology.  Patient was also found to have AKI with CKD stage IIIb.  Creatinine close to baseline on discharge.  Improved significantly with IV fluid, he did receive 1 dose of IV Lasix for concern of some lung crackles, chest x-ray was clear, resulted in mild worsening in creatinine.  Patient will follow-up with nephrology closely for further management.  Patient was also found to have lactic acidosis most likely secondary to decreased perfusion with NSTEMI.  Resolved with IV fluid.  No need for antibiotics and no obvious source of infection.  Patient received 1 unit of PRBC during current hospitalization with a goal hemoglobin above 8 due to recent NSTEMI.  No obvious bleeding.  PT evaluated him and recommended SNF placement where he is being discharged.  Patient will continue with rest of his medications and follow-up with his providers.  Patient is very high risk for readmission, deterioration and death.  Discharge Diagnoses:  Active Problems:   Chronic systolic heart failure (HCC)   Essential hypertension   COPD (chronic obstructive pulmonary disease) with chronic bronchitis (HCC)   Troponin level elevated   Acute renal failure superimposed on stage 3b chronic kidney disease (HCC)   Atrial fibrillation, chronic (HCC)   Weakness   Hyponatremia   Pressure injury of sacral region, stage 2 (HCC)   SOB (shortness of breath)   Discharge Instructions  Discharge Instructions    Diet - low sodium heart healthy   Complete by: As directed  Discharge instructions   Complete by: As directed    It was pleasure taking care of you. Your metolazone and torsemide is being held, please check your weight daily and if you see a 3 pound increase in 1 day or 5 pound increase in 1 week please  start taking your torsemide. Follow-up closely with your cardiologist for further management.   Discharge wound care:   Complete by: As directed    Continue with gel dressing to prevent further pressure injuries. Keep changing the position.   Increase activity slowly   Complete by: As directed      Allergies as of 04/08/2021      Reactions   Lactose Intolerance (gi)       Medication List    STOP taking these medications   iron polysaccharides 150 MG capsule Commonly known as: NIFEREX   magnesium oxide 400 MG tablet Commonly known as: MAG-OX   metolazone 2.5 MG tablet Commonly known as: ZAROXOLYN   multivitamin with minerals Tabs tablet   potassium chloride 10 MEQ tablet Commonly known as: KLOR-CON     TAKE these medications   acetaminophen 325 MG tablet Commonly known as: TYLENOL Take 2 tablets (650 mg total) by mouth every 6 (six) hours as needed for mild pain (or Fever >/= 101).   albuterol 108 (90 Base) MCG/ACT inhaler Commonly known as: VENTOLIN HFA Inhale 2 puffs into the lungs every 6 (six) hours as needed for wheezing or shortness of breath.   apixaban 2.5 MG Tabs tablet Commonly known as: ELIQUIS Take 2.5 mg by mouth every 12 (twelve) hours.   B Complex Caps Take 1 capsule by mouth daily.   bifidobacterium infantis capsule Take 1 capsule by mouth every morning.   bisacodyl 10 MG suppository Commonly known as: DULCOLAX Place 10 mg rectally daily as needed for moderate constipation.   cholecalciferol 1000 units tablet Commonly known as: VITAMIN D Take 1,000 Units by mouth daily.   Cinnamon 500 MG capsule Take 1,000 mg by mouth 2 (two) times daily.   clopidogrel 75 MG tablet Commonly known as: PLAVIX Take 1 tablet (75 mg total) by mouth daily.   Combivent Respimat 20-100 MCG/ACT Aers respimat Generic drug: Ipratropium-Albuterol Inhale 2 puffs into the lungs 4 (four) times daily as needed.   CRANBERRY EXTRACT PO Take 500 mg by mouth every  morning.   feeding supplement (GLUCERNA SHAKE) Liqd Take 237 mLs by mouth 3 (three) times daily between meals. What changed: Another medication with the same name was removed. Continue taking this medication, and follow the directions you see here.   ferrous sulfate 325 (65 FE) MG tablet Take 1 tablet (325 mg total) by mouth 2 (two) times daily with a meal.   gabapentin 100 MG capsule Commonly known as: NEURONTIN Take 100 mg by mouth 2 (two) times daily.   glucose 4 GM chewable tablet Chew 1 tablet by mouth as needed for low blood sugar.   hydrocortisone 2.5 % rectal cream Commonly known as: ANUSOL-HC Place 1 application rectally 2 (two) times daily.   insulin aspart 100 UNIT/ML injection Commonly known as: novoLOG 0-9 Units, Subcutaneous, 3 times daily with meals CBG < 70: Implement Hypoglycemia protocol/measures CBG 70 - 120: 0 units CBG 121 - 150: 1 unit CBG 151 - 200: 2 units CBG 201 - 250: 3 units CBG 251 - 300: 5 units CBG 301 - 350: 7 units CBG 351 - 400: 9 units CBG > 400: call MD   lactase 3000 units  tablet Commonly known as: LACTAID Take 3,000 Units by mouth 3 (three) times daily between meals as needed.   Lantus SoloStar 100 UNIT/ML Solostar Pen Generic drug: insulin glargine Inject 15 Units into the skin daily at 10 pm. What changed: how much to take   multivitamin-lutein Caps capsule Take 1 capsule by mouth daily.   nicotine 21 mg/24hr patch Commonly known as: NICODERM CQ - dosed in mg/24 hours Place 21 mg onto the skin daily.   omeprazole 20 MG capsule Commonly known as: PRILOSEC Take 20 mg by mouth daily.   polyethylene glycol 17 g packet Commonly known as: MIRALAX / GLYCOLAX Take 17 g by mouth daily as needed for mild constipation.   simvastatin 20 MG tablet Commonly known as: ZOCOR Take 20 mg by mouth in the morning.   Spiriva HandiHaler 18 MCG inhalation capsule Generic drug: tiotropium Place 1 capsule (18 mcg total) into inhaler and  inhale daily.   torsemide 20 MG tablet Commonly known as: DEMADEX Take 1 tablet (20 mg total) by mouth every Tuesday, Thursday, Saturday, and Sunday. Start taking on: April 09, 2021 What changed:   how much to take  when to take this   traZODone 50 MG tablet Commonly known as: DESYREL Take 50 mg by mouth at bedtime.   vitamin C 500 MG tablet Commonly known as: ASCORBIC ACID Take 500 mg by mouth 2 (two) times daily.            Discharge Care Instructions  (From admission, onward)         Start     Ordered   04/08/21 0000  Discharge wound care:       Comments: Continue with gel dressing to prevent further pressure injuries. Keep changing the position.   04/08/21 1322          Contact information for follow-up providers    [name], MD. Schedule an appointment as soon as possible for a visit.   Specialty: Family Medicine Contact information: [address] [facility] [place] [address] [phone]        Teodoro Spray, MD. Schedule an appointment as soon as possible for a visit in 1 week(s).   Specialty: Cardiology Contact information: [place] [address] [phone]            Contact information for after-discharge care    Destination    [facility] .   Service: Skilled Nursing Contact information: [address] [place] [phone]                 Allergies  Allergen Reactions  . Lactose Intolerance (Gi)     Consultations:  Nephrology  Procedures/Studies: DG Chest 1 View  Result Date: 04/02/2021 CLINICAL DATA:  Confusion, weakness and elevated white blood cell count. EXAM: CHEST  1 VIEW COMPARISON:  Single-view of the chest 02/21/2021 and 03/01/2021. CT chest 02/02/2021. FINDINGS: The lungs are emphysematous. There is left basilar atelectasis. The right lung is clear. Cardiomegaly. The patient is  status post CABG. Aortic atherosclerosis. No pneumothorax or pleural fluid. IMPRESSION: No acute disease. Left basilar atelectasis. Cardiomegaly. Aortic Atherosclerosis (ICD10-I70.0) and Emphysema (ICD10-J43.9). Electronically Signed   By: Inge Rise M.D.   On: 04/02/2021 17:04   CT Head Wo Contrast  Result Date: 04/02/2021 CLINICAL DATA:  Generalized weakness and altered mental status EXAM: CT HEAD WITHOUT CONTRAST TECHNIQUE: Contiguous axial images were obtained  from the base of the skull through the vertex without intravenous contrast. COMPARISON:  02/19/2021 FINDINGS: Brain: No evidence of acute infarction, hemorrhage, hydrocephalus, extra-axial collection or mass lesion/mass effect. Mild atrophic and chronic white matter ischemic changes are noted. Vascular: No hyperdense vessel or unexpected calcification. Skull: Normal. Negative for fracture or focal lesion. Sinuses/Orbits: No acute finding. Other: None. IMPRESSION: Chronic atrophic and ischemic changes without acute abnormality. Electronically Signed   By: Inez Catalina M.D.   On: 04/02/2021 16:46   DG Chest Port 1 View  Result Date: 04/07/2021 CLINICAL DATA:  Shortness of breath EXAM: PORTABLE CHEST 1 VIEW COMPARISON:  Apr 02, 2021 FINDINGS: There is again cardiomegaly. There is pulmonary venous hypertension. There is extensive interstitial thickening and airspace opacity, likely representing interstitial and alveolar pulmonary edema. There is a left pleural effusion. Patient is status post coronary artery bypass grafting. There is aortic atherosclerosis. No bone lesions. IMPRESSION: Cardiomegaly with pulmonary vascular congestion. Suspected interstitial alveolar edema with left pleural effusion. The overall appearance is consistent with congestive heart failure. It should be noted that patchy superimposed pneumonia cannot be excluded in this circumstance. Status post coronary artery bypass grafting. Aortic Atherosclerosis (ICD10-I70.0).  Electronically Signed   By: Lowella Grip III M.D.   On: 04/07/2021 10:32     Subjective: Patient was seen and examined today.  Appears little somnolent, per nursing staff patient perks up in the afternoon and at time becoming agitated overnight.  Does not sleep well at night and remained more tired during morning hours.  Wife at bedside.  Patient did ate his breakfast.  Wife at bedside. Patient with declining health, multiple recent hospitalizations-discussed with wife and she would like to continue with full code but agreeable to see palliative care.  Discharge Exam: Vitals:   04/08/21 0724 04/08/21 1129  BP: (!) 158/57 (!) 175/58  Pulse: 68 63  Resp: 18 16  Temp: 99 F (37.2 C) 98.2 F (36.8 C)  SpO2: 98% 94%   Vitals:   04/07/21 1949 04/08/21 0518 04/08/21 0724 04/08/21 1129  BP: (!) 164/50 (!) 152/79 (!) 158/57 (!) 175/58  Pulse: (!) 52 (!) 56 68 63  Resp: 20 20 18 16   Temp: 97.7 F (36.5 C) 98.5 F (36.9 C) 99 F (37.2 C) 98.2 F (36.8 C)  TempSrc: Oral Oral Oral Oral  SpO2: 99% 93% 98% 94%  Weight:      Height:        General: Pt is alert, awake, not in acute distress Cardiovascular: RRR, S1/S2 +, no rubs, no gallops Respiratory: CTA bilaterally, no wheezing, no rhonchi Abdominal: Soft, NT, ND, bowel sounds + Extremities: no edema, no cyanosis   The results of significant diagnostics from this hospitalization (including imaging, microbiology, ancillary and laboratory) are listed below for reference.    Microbiology: Recent Results (from the past 240 hour(s))  Resp Panel by RT-PCR (Flu A&B, Covid) Nasopharyngeal Swab     Status: None   Collection Time: 04/02/21  4:39 PM   Specimen: Nasopharyngeal Swab; Nasopharyngeal(NP) swabs in vial transport medium  Result Value Ref Range Status   SARS Coronavirus 2 by RT PCR NEGATIVE NEGATIVE Final    Comment: (NOTE) SARS-CoV-2 target nucleic acids are NOT DETECTED.  The SARS-CoV-2 RNA is generally detectable in  upper respiratory specimens during the acute phase of infection. The lowest concentration of SARS-CoV-2 viral copies this assay can detect is 138 copies/mL. A negative result does not preclude SARS-Cov-2 infection and should not be used as the  sole basis for treatment or other patient management decisions. A negative result may occur with  improper specimen collection/handling, submission of specimen other than nasopharyngeal swab, presence of viral mutation(s) within the areas targeted by this assay, and inadequate number of viral copies(<138 copies/mL). A negative result must be combined with clinical observations, patient history, and epidemiological information. The expected result is Negative.  Fact Sheet for Patients:  EntrepreneurPulse.com.au  Fact Sheet for Healthcare Providers:  IncredibleEmployment.be  This test is no t yet approved or cleared by the [place] FDA and  has been authorized for detection and/or diagnosis of SARS-CoV-2 by FDA under an Emergency Use Authorization (EUA). This EUA will remain  in effect (meaning this test can be used) for the duration of the COVID-19 declaration under Section 564(b)(1) of the Act, 21 U.S.C.section 360bbb-3(b)(1), unless the authorization is terminated  or revoked sooner.       Influenza A by PCR NEGATIVE NEGATIVE Final   Influenza B by PCR NEGATIVE NEGATIVE Final    Comment: (NOTE) The Xpert Xpress SARS-CoV-2/FLU/RSV plus assay is intended as an aid in the diagnosis of influenza from Nasopharyngeal swab specimens and should not be used as a sole basis for treatment. Nasal washings and aspirates are unacceptable for Xpert Xpress SARS-CoV-2/FLU/RSV testing.  Fact Sheet for Patients: EntrepreneurPulse.com.au  Fact Sheet for Healthcare Providers: IncredibleEmployment.be  This test is not yet approved or cleared by the [place] FDA and has been  authorized for detection and/or diagnosis of SARS-CoV-2 by FDA under an Emergency Use Authorization (EUA). This EUA will remain in effect (meaning this test can be used) for the duration of the COVID-19 declaration under Section 564(b)(1) of the Act, 21 U.S.C. section 360bbb-3(b)(1), unless the authorization is terminated or revoked.  Performed at [facility], [address] [place], [place] [address]   Blood culture (single)     Status: None   Collection Time: 04/02/21  4:39 PM   Specimen: BLOOD  Result Value Ref Range Status   Specimen Description BLOOD LEFT ANTECUBITAL  Final   Special Requests BOTTLES DRAWN AEROBIC AND ANAEROBIC LOW VOLUME  Final   Culture   Final    NO GROWTH 5 DAYS Performed at [facility], [address] [place], [place] [address]    Report Status 04/07/2021 FINAL  Final  SARS CORONAVIRUS 2 (TAT 6-24 HRS) Nasopharyngeal Nasopharyngeal Swab     Status: None   Collection Time: 04/07/21  4:48 PM   Specimen: Nasopharyngeal Swab  Result Value Ref Range Status   SARS Coronavirus 2 NEGATIVE NEGATIVE Final    Comment: (NOTE) SARS-CoV-2 target nucleic acids are NOT DETECTED.  The SARS-CoV-2 RNA is generally detectable in upper and lower respiratory specimens during the acute phase of infection. Negative results do not preclude SARS-CoV-2 infection, do not rule out co-infections with other pathogens, and should not be used as the sole basis for treatment or other patient management decisions. Negative results must be combined with clinical observations, patient history, and epidemiological information. The expected result is Negative.  Fact Sheet for Patients: SugarRoll.be  Fact Sheet for Healthcare Providers: https://www.woods-mathews.com/  This test is not yet approved or cleared by the [place] FDA and  has been authorized for detection and/or diagnosis of SARS-CoV-2 by FDA under an  Emergency Use Authorization (EUA). This EUA will remain  in effect (meaning this test can be used) for the duration of the COVID-19 declaration under Se ction 564(b)(1) of the Act, 21 U.S.C. section 360bbb-3(b)(1),  unless the authorization is terminated or revoked sooner.  Performed at [facility], [place] [address], [place], [place] [address]      Labs: BNP (last 3 results) Recent Labs    11/16/20 0900 11/22/20 1345 01/28/21 0338  BNP 1,245.8* 796.1* 3,291.9*   Basic Metabolic Panel: Recent Labs  Lab 04/03/21 0357 04/03/21 1749 04/04/21 0457 04/05/21 0438 04/06/21 0857 04/07/21 0452 04/08/21 0528  NA 122*   < > 127* 131* 130* 134* 136  K 4.5  --  4.3 3.9 4.4 4.8 4.5  CL 83*  --  91* 96* 96* 102 103  CO2 26  --  22 24 25 25 26   GLUCOSE 111*  --  136* 134* 130* 162* 150*  BUN 109*  --  93* 85* 73* 63* 60*  CREATININE 4.69*  --  3.37* 2.37* 2.07* 1.89* 2.03*  CALCIUM 8.1*  --  8.1* 8.3* 8.2* 8.5* 8.7*  MG 3.4*  --   --   --   --   --  2.5*  PHOS  --   --   --   --   --   --  2.8   < > = values in this interval not displayed.   Liver Function Tests: Recent Labs  Lab 04/02/21 1639 04/03/21 0357  AST 44* 51*  ALT 14 15  ALKPHOS 109 101  BILITOT 0.8 0.7  PROT 6.5 6.4*  ALBUMIN 3.5 3.4*   No results for input(s): LIPASE, AMYLASE in the last 168 hours. Recent Labs  Lab 04/02/21 1639  AMMONIA <9*   CBC: Recent Labs  Lab 04/02/21 1550 04/03/21 0357 04/04/21 0457 04/05/21 0438 04/07/21 0452  WBC 17.5* 9.6 13.7* 10.9* 11.0*  NEUTROABS  --  7.6  --   --   --   HGB 7.3* 7.2* 7.9* 7.9* 7.6*  HCT 23.1* 22.8* 25.0* 25.7* 25.3*  MCV 73.6* 74.3* 75.5* 76.9* 78.6*  PLT 273 259 239 280 270   Cardiac Enzymes: No results for input(s): CKTOTAL, CKMB, CKMBINDEX, TROPONINI in the last 168 hours. BNP: Invalid input(s): POCBNP CBG: Recent Labs  Lab 04/07/21 1218 04/07/21 1631 04/07/21 2113 04/08/21 0741 04/08/21 1201  GLUCAP 203* 277* 187* 151* 172*    D-Dimer No results for input(s): DDIMER in the last 72 hours. Hgb A1c No results for input(s): HGBA1C in the last 72 hours. Lipid Profile No results for input(s): CHOL, HDL, LDLCALC, TRIG, CHOLHDL, LDLDIRECT in the last 72 hours. Thyroid function studies No results for input(s): TSH, T4TOTAL, T3FREE, THYROIDAB in the last 72 hours.  Invalid input(s): FREET3 Anemia work up No results for input(s): VITAMINB12, FOLATE, FERRITIN, TIBC, IRON, RETICCTPCT in the last 72 hours. Urinalysis    Component Value Date/Time   COLORURINE AMBER (A) 04/02/2021 1639   APPEARANCEUR HAZY (A) 04/02/2021 1639   APPEARANCEUR Clear 04/28/2014 2346   LABSPEC 1.015 04/02/2021 1639   LABSPEC 1.009 04/28/2014 2346   PHURINE 5.0 04/02/2021 1639   GLUCOSEU NEGATIVE 04/02/2021 1639   GLUCOSEU Negative 04/28/2014 2346   HGBUR NEGATIVE 04/02/2021 1639   BILIRUBINUR NEGATIVE 04/02/2021 1639   BILIRUBINUR Negative 04/28/2014 [place] 04/02/2021 1639   PROTEINUR NEGATIVE 04/02/2021 1639   NITRITE NEGATIVE 04/02/2021 [place] 04/02/2021 1639   LEUKOCYTESUR 1+ 04/28/2014 2346   Sepsis Labs Invalid input(s): PROCALCITONIN,  WBC,  LACTICIDVEN Microbiology Recent Results (from the past 240 hour(s))  Resp Panel by RT-PCR (Flu A&B, Covid) Nasopharyngeal Swab     Status:  None   Collection Time: 04/02/21  4:39 PM   Specimen: Nasopharyngeal Swab; Nasopharyngeal(NP) swabs in vial transport medium  Result Value Ref Range Status   SARS Coronavirus 2 by RT PCR NEGATIVE NEGATIVE Final    Comment: (NOTE) SARS-CoV-2 target nucleic acids are NOT DETECTED.  The SARS-CoV-2 RNA is generally detectable in upper respiratory specimens during the acute phase of infection. The lowest concentration of SARS-CoV-2 viral copies this assay can detect is 138 copies/mL. A negative result does not preclude SARS-Cov-2 infection and should not be used as the sole basis for treatment or other patient  management decisions. A negative result may occur with  improper specimen collection/handling, submission of specimen other than nasopharyngeal swab, presence of viral mutation(s) within the areas targeted by this assay, and inadequate number of viral copies(<138 copies/mL). A negative result must be combined with clinical observations, patient history, and epidemiological information. The expected result is Negative.  Fact Sheet for Patients:  EntrepreneurPulse.com.au  Fact Sheet for Healthcare Providers:  IncredibleEmployment.be  This test is no t yet approved or cleared by the [place] FDA and  has been authorized for detection and/or diagnosis of SARS-CoV-2 by FDA under an Emergency Use Authorization (EUA). This EUA will remain  in effect (meaning this test can be used) for the duration of the COVID-19 declaration under Section 564(b)(1) of the Act, 21 U.S.C.section 360bbb-3(b)(1), unless the authorization is terminated  or revoked sooner.       Influenza A by PCR NEGATIVE NEGATIVE Final   Influenza B by PCR NEGATIVE NEGATIVE Final    Comment: (NOTE) The Xpert Xpress SARS-CoV-2/FLU/RSV plus assay is intended as an aid in the diagnosis of influenza from Nasopharyngeal swab specimens and should not be used as a sole basis for treatment. Nasal washings and aspirates are unacceptable for Xpert Xpress SARS-CoV-2/FLU/RSV testing.  Fact Sheet for Patients: EntrepreneurPulse.com.au  Fact Sheet for Healthcare Providers: IncredibleEmployment.be  This test is not yet approved or cleared by the [place] FDA and has been authorized for detection and/or diagnosis of SARS-CoV-2 by FDA under an Emergency Use Authorization (EUA). This EUA will remain in effect (meaning this test can be used) for the duration of the COVID-19 declaration under Section 564(b)(1) of the Act, 21 U.S.C. section 360bbb-3(b)(1),  unless the authorization is terminated or revoked.  Performed at [facility], [address] [place], [place] [address]   Blood culture (single)     Status: None   Collection Time: 04/02/21  4:39 PM   Specimen: BLOOD  Result Value Ref Range Status   Specimen Description BLOOD LEFT ANTECUBITAL  Final   Special Requests BOTTLES DRAWN AEROBIC AND ANAEROBIC LOW VOLUME  Final   Culture   Final    NO GROWTH 5 DAYS Performed at [facility], [address], [place], [place] [address]    Report Status 04/07/2021 FINAL  Final  SARS CORONAVIRUS 2 (TAT 6-24 HRS) Nasopharyngeal Nasopharyngeal Swab     Status: None   Collection Time: 04/07/21  4:48 PM   Specimen: Nasopharyngeal Swab  Result Value Ref Range Status   SARS Coronavirus 2 NEGATIVE NEGATIVE Final    Comment: (NOTE) SARS-CoV-2 target nucleic acids are NOT DETECTED.  The SARS-CoV-2 RNA is generally detectable in upper and lower respiratory specimens during the acute phase of infection. Negative results do not preclude SARS-CoV-2 infection, do not rule out co-infections with other pathogens, and should not be used as the sole basis for treatment or other patient management decisions.  Negative results must be combined with clinical observations, patient history, and epidemiological information. The expected result is Negative.  Fact Sheet for Patients: SugarRoll.be  Fact Sheet for Healthcare Providers: https://www.woods-mathews.com/  This test is not yet approved or cleared by the [place] FDA and  has been authorized for detection and/or diagnosis of SARS-CoV-2 by FDA under an Emergency Use Authorization (EUA). This EUA will remain  in effect (meaning this test can be used) for the duration of the COVID-19 declaration under Se ction 564(b)(1) of the Act, 21 U.S.C. section 360bbb-3(b)(1), unless the authorization is terminated or revoked sooner.  Performed at  [facility], [place] [address] [place], [place] [address]     Time coordinating discharge: Over 30 minutes  SIGNED:  Lorella Nimrod, MD  [facility] 04/08/2021, 1:26 PM  If 7PM-7AM, please contact night-coverage [email or web address]  This record has been created using Systems analyst. Errors have been sought and corrected,but may not always be located. Such creation errors do not reflect on the standard of care.

## 2021-04-08 NOTE — TOC Progression Note (Addendum)
Transition of Care [facility]) - Progression Note    Patient Details  Name: [name] MRN: [id number] Date of Birth: [birth year]  Transition of Care [facility]) CM/SW [place], LCSW Phone Number: 04/08/2021, 9:11 AM  Clinical Narrative:   COVID results pending.  10:14 pm: Notified [facility] admissions coordinator that MD still planning on discharging patient today.  1:46 pm: Left voicemail for [facility] admissions coordinator to let him know that discharge summary is on the hub and COVID is negative. Asked for call back with time that transport can be set up. Obtained authorization details from [facility]. SNF: [id number], EMS: [id number].  Expected Discharge Plan: Skilled Nursing Facility Barriers to Discharge: Continued Medical Work up  Expected Discharge Plan and Services Expected Discharge Plan: [place]       Living arrangements for the past 2 months: Single Family Home                                       Social Determinants of Health (SDOH) Interventions    Readmission Risk Interventions Readmission Risk Prevention Plan 04/05/2021 02/04/2021 10/28/2020  Transportation Screening Complete Complete Complete  PCP or Specialist Appt within 3-5 Days - - -  HRI or [place] - - -  Medication Review (RN Care Manager) Complete Complete Complete  PCP or Specialist appointment within 3-5 days of discharge Complete Complete Complete  HRI or Home Care Consult Complete Complete Complete  SW Recovery Care/Counseling Consult Complete Complete -  Palliative Care Screening Not Applicable Not Applicable Not Applicable  Skilled Nursing Facility Complete Not Applicable Not Applicable  Some recent data might be hidden

## 2021-04-08 NOTE — Care Management Important Message (Signed)
Important Message  Patient Details  Name: Medhansh Brinkmeier MRN: [id number] Date of Birth: [birth year]   Medicare Important Message Given:  Yes     Dannette Barbara 04/08/2021, 11:33 AM

## 2021-04-08 NOTE — Progress Notes (Signed)
Tele called to report patient had 8 beat run of V Tach and then converted to Afib with rates in 60's-70's. Dr. Reesa Chew notified via secure chat.

## 2021-04-09 DIAGNOSIS — J984 Other disorders of lung: Secondary | ICD-10-CM | POA: Diagnosis not present

## 2021-04-09 DIAGNOSIS — E440 Moderate protein-calorie malnutrition: Secondary | ICD-10-CM | POA: Diagnosis not present

## 2021-04-09 DIAGNOSIS — N179 Acute kidney failure, unspecified: Secondary | ICD-10-CM | POA: Diagnosis not present

## 2021-04-09 DIAGNOSIS — I6389 Other cerebral infarction: Secondary | ICD-10-CM | POA: Diagnosis not present

## 2021-04-09 DIAGNOSIS — R5381 Other malaise: Secondary | ICD-10-CM | POA: Diagnosis not present

## 2021-04-09 DIAGNOSIS — I480 Paroxysmal atrial fibrillation: Secondary | ICD-10-CM | POA: Diagnosis not present

## 2021-04-09 DIAGNOSIS — I10 Essential (primary) hypertension: Secondary | ICD-10-CM | POA: Diagnosis not present

## 2021-04-09 DIAGNOSIS — E871 Hypo-osmolality and hyponatremia: Secondary | ICD-10-CM | POA: Diagnosis not present

## 2021-04-09 DIAGNOSIS — N183 Chronic kidney disease, stage 3 unspecified: Secondary | ICD-10-CM | POA: Diagnosis not present

## 2021-04-09 DIAGNOSIS — E119 Type 2 diabetes mellitus without complications: Secondary | ICD-10-CM | POA: Diagnosis not present

## 2021-04-09 DIAGNOSIS — J9610 Chronic respiratory failure, unspecified whether with hypoxia or hypercapnia: Secondary | ICD-10-CM | POA: Diagnosis not present

## 2021-04-09 DIAGNOSIS — F418 Other specified anxiety disorders: Secondary | ICD-10-CM | POA: Diagnosis not present

## 2021-04-09 DIAGNOSIS — J9600 Acute respiratory failure, unspecified whether with hypoxia or hypercapnia: Secondary | ICD-10-CM | POA: Diagnosis not present

## 2021-04-09 DIAGNOSIS — I5033 Acute on chronic diastolic (congestive) heart failure: Secondary | ICD-10-CM | POA: Diagnosis not present

## 2021-04-10 DIAGNOSIS — F418 Other specified anxiety disorders: Secondary | ICD-10-CM | POA: Diagnosis not present

## 2021-04-10 DIAGNOSIS — J984 Other disorders of lung: Secondary | ICD-10-CM | POA: Diagnosis not present

## 2021-04-15 DIAGNOSIS — I5022 Chronic systolic (congestive) heart failure: Secondary | ICD-10-CM | POA: Diagnosis not present

## 2021-04-15 DIAGNOSIS — I25810 Atherosclerosis of coronary artery bypass graft(s) without angina pectoris: Secondary | ICD-10-CM | POA: Diagnosis not present

## 2021-04-15 DIAGNOSIS — J438 Other emphysema: Secondary | ICD-10-CM | POA: Diagnosis not present

## 2021-04-15 DIAGNOSIS — I480 Paroxysmal atrial fibrillation: Secondary | ICD-10-CM | POA: Diagnosis not present

## 2021-04-15 DIAGNOSIS — I10 Essential (primary) hypertension: Secondary | ICD-10-CM | POA: Diagnosis not present

## 2021-04-15 DIAGNOSIS — D649 Anemia, unspecified: Secondary | ICD-10-CM | POA: Diagnosis not present

## 2021-04-15 DIAGNOSIS — I350 Nonrheumatic aortic (valve) stenosis: Secondary | ICD-10-CM | POA: Diagnosis not present

## 2021-04-15 DIAGNOSIS — Z951 Presence of aortocoronary bypass graft: Secondary | ICD-10-CM | POA: Diagnosis not present

## 2021-04-15 DIAGNOSIS — Z8679 Personal history of other diseases of the circulatory system: Secondary | ICD-10-CM | POA: Diagnosis not present

## 2021-04-15 LAB — ACID FAST CULTURE WITH REFLEXED SENSITIVITIES (MYCOBACTERIA): Acid Fast Culture: NEGATIVE

## 2021-04-19 ENCOUNTER — Other Ambulatory Visit: Payer: Self-pay

## 2021-04-19 ENCOUNTER — Non-Acute Institutional Stay: Payer: Self-pay | Admitting: Primary Care

## 2021-04-19 DIAGNOSIS — Z515 Encounter for palliative care: Secondary | ICD-10-CM

## 2021-04-19 DIAGNOSIS — K649 Unspecified hemorrhoids: Secondary | ICD-10-CM | POA: Insufficient documentation

## 2021-04-19 DIAGNOSIS — E440 Moderate protein-calorie malnutrition: Secondary | ICD-10-CM

## 2021-04-19 DIAGNOSIS — R0602 Shortness of breath: Secondary | ICD-10-CM

## 2021-04-19 NOTE — Progress Notes (Signed)
[facility] Palliative Care Consult Note Telephone: [phone]  Fax: [phone]    Date of encounter: 04/19/21 PATIENT NAME: [name] [address] [place] [address]   [phone] (home)  DOB: 1946/05/08 MRN: [id number] PRIMARY CARE PROVIDER:    [name], MD,  [place]. [place] [facility] [place] [address] [phone]  REFERRING PROVIDER:   Alvester Morin, MD [phone] [address] [place],  [place] [address] [phone]   RESPONSIBLE PARTY:    Contact Information     Name Relation Home Work Mobile   Howard,Barry Spouse   [phone]   [name] Sister [phone]          I met face to face with patient in [facility] facility. Palliative Care was asked to follow this patient by consultation request of  [name], [name]* to address advance care planning and complex medical decision making. This is the initial visit.                                     ASSESSMENT AND PLAN / RECOMMENDATIONS:   Advance Care Planning/Goals of Care: Goals include to maximize quality of life and symptom management. Our advance care planning conversation included a discussion about:     Exploration of personal, cultural or spiritual beliefs that might influence medical decisions  Exploration of goals of care in the event of a sudden injury or illness CODE STATUS: DNR I also spoke with wife about goals of care. She states he is getting rehab now b/c he was d/c early before and relapsed. She sees some decline and lack of him gaining function back. They will have a care plan meeting next week to discuss plan.  Symptom Management/Plan:  I met with patient in his nursing home room. He endorses weakness and is  participating in rehab. He in on his oxygen at 3 L.   Pain: We discussed his terrible pain of hemorrhoids. He reports that he doesn't feel he's getting the cream enough and so I would  recommend Anusol suppositories three times a day.  Sent to [employer], Sent anusol 25 mg suppositories pr TID, #45, refills 3. I also will start him on MiraLAX 17 gm po daily, #257 gm, refill 1, with  1 senna tablet po daily #30 Refill 2. We discussed how optimally his stool should be quite soft and this may help the hemorrhoids.   Dysnpea: His breathing labor seems to be significant,  some cough at times with very diminished breath sounds. I would suggest a nebulizing treatment with duoneb's every six hours. I would also recommend  a double or triple medication inhaler if his insurance pays for it; currently he's on Spiriva. He denies having a rescue inhaler.   Appetite patient states he has a fairly good happen tonight if he likes the food. He states he has his glucose checked and occasionally needs mealtime insulin but not often.   Nicoderm: Should be weaned if pt is able to tolerate.  Follow up Palliative Care Visit: Palliative care will continue to follow for complex medical decision making, advance care planning, and clarification of goals. Return 2-4 weeks or prn.  I spent 35 minutes providing this consultation. More than 50% of the time in this consultation was spent in counseling and care coordination. PPS: 40%  HOSPICE ELIGIBILITY/DIAGNOSIS: TBD  Chief Complaint: pain, rectal  HISTORY OF  PRESENT ILLNESS:  [name] is a 75 y.o. year old male  with recent debility, CKD, CAD, COPD, DM, acute hemorrhoid pain, oxygen dependence. History obtained from review of EMR, discussion with primary team, and interview with family, facility staff/caregiver and/or [name].  I reviewed available labs, medications, imaging, studies and related documents from the EMR.  Records reviewed and summarized above.   ROS   General: NAD ENMT: denies dysphagia Cardiovascular: denies chest pain, endorses DOE Pulmonary: endorses cough, endorses increased SOB Abdomen: endorses fair appetite, endorses  constipation, endorses continence of bowel GU: denies dysuria, endorses continence of urine MSK:  endorses weakness,  no falls reported Skin: denies rashes or wounds Neurological: endorses 9/10 hemorrhoid pain, denies insomnia Psych: Endorses flat mood Heme/lymph/immuno: denies bruises, abnormal bleeding  Physical Exam: Current and past weights: 145-150 lbs Constitutional: NAD General: frail appearing, thin EYES: anicteric sclera, lids intact, no discharge  ENMT: intact hearing, oral mucous membranes moist, dentition intact CV: no LE edema Pulmonary: diminished sounds, slight  increased work of breathing, no cough, 3 l oxygen  Abdomen: intake 75-100 %, no ascites GU: deferred MSK: + sarcopenia, moves all extremities, ambulatory with walker and standby Skin: warm and dry, no rashes or wounds on visible skin Neuro:  ++ generalized weakness,  mild cognitive impairment Psych: slight anxious affect, A and O x 2-3 Hem/lymph/immuno: no widespread bruising   CURRENT PROBLEM LIST:  Patient Active Problem List   Diagnosis Date Noted   SOB (shortness of breath)    Pressure injury of sacral region, stage 2 ([place]) 04/06/2021   Hyponatremia 04/02/2021   Pressure injury of skin 02/20/2021   AKI (acute kidney injury) ([place]) 02/19/2021   Type 2 diabetes mellitus with hypoglycemia without coma ([place]) 02/19/2021   Bradycardia 02/19/2021   Weakness 02/19/2021   Dental infection 02/19/2021   Acute respiratory failure ([place]) 01/28/2021   Acute renal failure superimposed on stage 3b chronic kidney disease ([place]) 01/28/2021   Atrial fibrillation, chronic (HCC) 01/28/2021   Acute CHF (congestive heart failure) (HCC) 11/15/2020   Chronic diastolic CHF (congestive heart failure) ([place]) 10/24/2020   Troponin level elevated 10/24/2020   CKD (chronic kidney disease), stage IV ([place]) 10/24/2020   Malnutrition of moderate degree 12/27/2019   Hyperglycemia due to type 2 diabetes mellitus ([place]) 12/26/2019    Chronic kidney disease (CKD) stage G3b/A3, moderately decreased glomerular filtration rate (GFR) between 30-44 mL/min/1.73 square meter and albuminuria creatinine ratio greater than 300 mg/g ([place]) 12/26/2019   History of COVID-19 12/26/2019   History of CVA (cerebrovascular accident) 12/26/2019   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   History of second degree heart block 12/26/2019   Elevated LFTs 12/26/2019   Malignant neoplasm of prostate ([place]) 11/24/2016   Bright red rectal bleeding 17/61/6073   Chronic systolic heart failure ([place]) 12/12/2015   Essential hypertension 12/12/2015   COPD (chronic obstructive pulmonary disease) with chronic bronchitis ([place]) 12/12/2015   Tobacco abuse 12/12/2015   PAST MEDICAL HISTORY:  Active Ambulatory Problems    Diagnosis Date Noted   Chronic systolic heart failure ([place]) 12/12/2015   Essential hypertension 12/12/2015   COPD (chronic obstructive pulmonary disease) with chronic bronchitis ([place]) 12/12/2015   Tobacco abuse 12/12/2015   Bright red rectal bleeding 10/02/2016   Malignant neoplasm of prostate ([place]) 11/24/2016   Hyperglycemia due to type 2 diabetes mellitus ([place]) 12/26/2019   Chronic kidney disease (CKD) stage G3b/A3, moderately decreased glomerular filtration rate (GFR) between 30-44 mL/min/1.73 square meter and albuminuria creatinine ratio greater than 300  mg/g ([place]) 12/26/2019   History of COVID-19 12/26/2019   History of CVA (cerebrovascular accident) 12/26/2019   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   History of second degree heart block 12/26/2019   Elevated LFTs 12/26/2019   Malnutrition of moderate degree 12/27/2019   Chronic diastolic CHF (congestive heart failure) ([place]) 10/24/2020   Troponin level elevated 10/24/2020   CKD (chronic kidney disease), stage IV ([place]) 10/24/2020   Acute CHF (congestive heart failure) ([place]) 11/15/2020   Acute respiratory failure ([place]) 01/28/2021   Acute renal failure superimposed on stage 3b chronic kidney disease ([place]) 01/28/2021    Atrial fibrillation, chronic ([place]) 01/28/2021   AKI (acute kidney injury) ([place]) 02/19/2021   Type 2 diabetes mellitus with hypoglycemia without coma ([place]) 02/19/2021   Bradycardia 02/19/2021   Weakness 02/19/2021   Dental infection 02/19/2021   Pressure injury of skin 02/20/2021   Hyponatremia 04/02/2021   Pressure injury of sacral region, stage 2 ([place]) 04/06/2021   SOB (shortness of breath)    Resolved Ambulatory Problems    Diagnosis Date Noted   Acute respiratory failure with hypoxia ([place]) 12/03/2015   Diabetes ([place]) 12/12/2015   COPD with acute exacerbation ([place]) 21/30/8657   Acute systolic CHF (congestive heart failure) ([place]) 10/11/2019   Pneumonia due to COVID-19 virus    Hypertensive emergency 12/26/2019   Past Medical History:  Diagnosis Date   Arrhythmia    Atrial fibrillation ([place])    Basal cell carcinoma 05/2019   Basal cell carcinoma 04/12/2019   CHF (congestive heart failure) (HCC)    COPD (chronic obstructive pulmonary disease) ([place])    Coronary artery disease    Diabetes mellitus without complication (HCC)    GERD (gastroesophageal reflux disease)    HLD (hyperlipidemia)    Hypertension    Peripheral vascular disease ([place])    Pneumonia    Prostate cancer ([place])    Squamous cell carcinoma of skin 08/10/2018   Squamous cell carcinoma of skin 09/07/2018   Stroke ([place])    SOCIAL HX:  Social History   Tobacco Use   Smoking status: Former    Packs/day: 1.00    Years: 54.00    Pack years: 54.00    Types: Cigarettes   Smokeless tobacco: Never  Substance Use Topics   Alcohol use: No    Alcohol/week: 0.0 standard drinks   FAMILY HX:  Family History  Problem Relation Age of Onset   Lung cancer Mother    Heart attack Father    Hypertension Other    Cancer Maternal Grandfather        prostate   Cancer Paternal Grandmother        breast      ALLERGIES:  Allergies  Allergen Reactions   Lactose Intolerance (Gi)      PERTINENT MEDICATIONS:   Outpatient Encounter Medications as of 04/19/2021  Medication Sig   acetaminophen (TYLENOL) 325 MG tablet Take 2 tablets (650 mg total) by mouth every 6 (six) hours as needed for mild pain (or Fever >/= 101).   albuterol (PROVENTIL HFA;VENTOLIN HFA) 108 (90 Base) MCG/ACT inhaler Inhale 2 puffs into the lungs every 6 (six) hours as needed for wheezing or shortness of breath.   apixaban (ELIQUIS) 2.5 MG TABS tablet Take 2.5 mg by mouth every 12 (twelve) hours.   B Complex CAPS Take 1 capsule by mouth daily.   bifidobacterium infantis (ALIGN) capsule Take 1 capsule by mouth every morning.   bisacodyl (DULCOLAX) 10 MG suppository Place 10 mg rectally daily as  needed for moderate constipation.   busPIRone (BUSPAR) 5 MG tablet Take 1 tablet by mouth 2 (two) times daily.   cholecalciferol (VITAMIN D) 1000 units tablet Take 1,000 Units by mouth daily.   Cinnamon 500 MG capsule Take 1,000 mg by mouth 2 (two) times daily.   clopidogrel (PLAVIX) 75 MG tablet Take 1 tablet (75 mg total) by mouth daily.   COMBIVENT RESPIMAT 20-100 MCG/ACT AERS respimat Inhale 2 puffs into the lungs 4 (four) times daily as needed.   CRANBERRY EXTRACT PO Take 500 mg by mouth every morning.    feeding supplement, GLUCERNA SHAKE, (GLUCERNA SHAKE) LIQD Take 237 mLs by mouth 3 (three) times daily between meals.   ferrous sulfate 325 (65 FE) MG tablet Take 1 tablet (325 mg total) by mouth 2 (two) times daily with a meal.   gabapentin (NEURONTIN) 100 MG capsule Take 100 mg by mouth 2 (two) times daily.   glucose 4 GM chewable tablet Chew 1 tablet by mouth as needed for low blood sugar.   hydrocortisone (ANUSOL-HC) 25 MG suppository Place 25 mg rectally in the morning, at noon, and at bedtime. Pt may administer   insulin aspart (NOVOLOG) 100 UNIT/ML injection 0-9 Units, Subcutaneous, 3 times daily with meals CBG < 70: Implement Hypoglycemia protocol/measures CBG 70 - 120: 0 units CBG 121 - 150: 1 unit CBG 151 - 200: 2 units CBG  201 - 250: 3 units CBG 251 - 300: 5 units CBG 301 - 350: 7 units CBG 351 - 400: 9 units CBG > 400: call MD   lactase (LACTAID) 3000 units tablet Take 3,000 Units by mouth 3 (three) times daily between meals as needed.   Lactobacillus (ACIDOPHILUS) TABS Take 1 tablet by mouth daily.   LANTUS SOLOSTAR 100 UNIT/ML Solostar Pen Inject 15 Units into the skin daily at 10 pm.   multivitamin-lutein (OCUVITE-LUTEIN) CAPS capsule Take 1 capsule by mouth daily.   nicotine (NICODERM CQ - DOSED IN MG/24 HOURS) 21 mg/24hr patch Place 21 mg onto the skin daily.   omeprazole (PRILOSEC) 20 MG capsule Take 20 mg by mouth daily.   polyethylene glycol (MIRALAX / GLYCOLAX) 17 g packet Take 17 g by mouth daily as needed for mild constipation.   polyethylene glycol (MIRALAX / GLYCOLAX) 17 g packet Take 17 g by mouth daily.   senna (SENOKOT) 8.6 MG TABS tablet Take 1 tablet by mouth daily.   simvastatin (ZOCOR) 20 MG tablet Take 20 mg by mouth in the morning.   tiotropium (SPIRIVA HANDIHALER) 18 MCG inhalation capsule Place 1 capsule (18 mcg total) into inhaler and inhale daily.   torsemide (DEMADEX) 20 MG tablet Take 1 tablet (20 mg total) by mouth every Tuesday, Thursday, Saturday, and Sunday.   traZODone (DESYREL) 50 MG tablet Take 50 mg by mouth at bedtime.   vitamin C (ASCORBIC ACID) 500 MG tablet Take 500 mg by mouth 2 (two) times daily.    [DISCONTINUED] hydrocortisone (ANUSOL-HC) 2.5 % rectal cream Place 1 application rectally 2 (two) times daily.   No facility-administered encounter medications on file as of 04/19/2021.    Thank you for the opportunity to participate in the care of [name].  The palliative care team will continue to follow. Please call our office at [phone] if we can be of additional assistance.   Jason Coop, NP , DNP, MPH, AGPCNP-BC, ACHPN  COVID-19 PATIENT SCREENING TOOL Asked and negative response unless otherwise noted:   Have you had symptoms of covid, tested  positive or been in contact with someone with symptoms/positive test in the past 5-10 days?

## 2021-04-21 ENCOUNTER — Inpatient Hospital Stay
Admission: EM | Admit: 2021-04-21 | Discharge: 2021-04-25 | DRG: 291 | Disposition: A | Discharge status: Hospice | Payer: PPO | Source: Skilled Nursing Facility | Attending: Student | Admitting: Student

## 2021-04-21 ENCOUNTER — Emergency Department: Payer: PPO

## 2021-04-21 DIAGNOSIS — D6489 Other specified anemias: Secondary | ICD-10-CM | POA: Diagnosis present

## 2021-04-21 DIAGNOSIS — J449 Chronic obstructive pulmonary disease, unspecified: Secondary | ICD-10-CM | POA: Diagnosis not present

## 2021-04-21 DIAGNOSIS — I248 Other forms of acute ischemic heart disease: Secondary | ICD-10-CM | POA: Diagnosis present

## 2021-04-21 DIAGNOSIS — N1832 Chronic kidney disease, stage 3b: Secondary | ICD-10-CM | POA: Diagnosis not present

## 2021-04-21 DIAGNOSIS — R350 Frequency of micturition: Secondary | ICD-10-CM | POA: Diagnosis present

## 2021-04-21 DIAGNOSIS — Z515 Encounter for palliative care: Secondary | ICD-10-CM

## 2021-04-21 DIAGNOSIS — I4892 Unspecified atrial flutter: Secondary | ICD-10-CM | POA: Diagnosis not present

## 2021-04-21 DIAGNOSIS — I10 Essential (primary) hypertension: Secondary | ICD-10-CM | POA: Diagnosis not present

## 2021-04-21 DIAGNOSIS — E1142 Type 2 diabetes mellitus with diabetic polyneuropathy: Secondary | ICD-10-CM | POA: Diagnosis not present

## 2021-04-21 DIAGNOSIS — I160 Hypertensive urgency: Secondary | ICD-10-CM | POA: Diagnosis present

## 2021-04-21 DIAGNOSIS — I739 Peripheral vascular disease, unspecified: Secondary | ICD-10-CM

## 2021-04-21 DIAGNOSIS — E785 Hyperlipidemia, unspecified: Secondary | ICD-10-CM | POA: Diagnosis not present

## 2021-04-21 DIAGNOSIS — I482 Chronic atrial fibrillation, unspecified: Secondary | ICD-10-CM | POA: Diagnosis present

## 2021-04-21 DIAGNOSIS — R0602 Shortness of breath: Secondary | ICD-10-CM | POA: Diagnosis not present

## 2021-04-21 DIAGNOSIS — J9601 Acute respiratory failure with hypoxia: Secondary | ICD-10-CM | POA: Diagnosis present

## 2021-04-21 DIAGNOSIS — Z87891 Personal history of nicotine dependence: Secondary | ICD-10-CM

## 2021-04-21 DIAGNOSIS — J9600 Acute respiratory failure, unspecified whether with hypoxia or hypercapnia: Secondary | ICD-10-CM | POA: Diagnosis present

## 2021-04-21 DIAGNOSIS — I5043 Acute on chronic combined systolic (congestive) and diastolic (congestive) heart failure: Secondary | ICD-10-CM | POA: Diagnosis not present

## 2021-04-21 DIAGNOSIS — Z8249 Family history of ischemic heart disease and other diseases of the circulatory system: Secondary | ICD-10-CM

## 2021-04-21 DIAGNOSIS — Z8673 Personal history of transient ischemic attack (TIA), and cerebral infarction without residual deficits: Secondary | ICD-10-CM

## 2021-04-21 DIAGNOSIS — Z85828 Personal history of other malignant neoplasm of skin: Secondary | ICD-10-CM

## 2021-04-21 DIAGNOSIS — I130 Hypertensive heart and chronic kidney disease with heart failure and stage 1 through stage 4 chronic kidney disease, or unspecified chronic kidney disease: Secondary | ICD-10-CM | POA: Diagnosis not present

## 2021-04-21 DIAGNOSIS — I25798 Atherosclerosis of other coronary artery bypass graft(s) with other forms of angina pectoris: Secondary | ICD-10-CM | POA: Diagnosis not present

## 2021-04-21 DIAGNOSIS — Z6824 Body mass index (BMI) 24.0-24.9, adult: Secondary | ICD-10-CM

## 2021-04-21 DIAGNOSIS — J441 Chronic obstructive pulmonary disease with (acute) exacerbation: Secondary | ICD-10-CM | POA: Diagnosis not present

## 2021-04-21 DIAGNOSIS — J159 Unspecified bacterial pneumonia: Secondary | ICD-10-CM

## 2021-04-21 DIAGNOSIS — Z7951 Long term (current) use of inhaled steroids: Secondary | ICD-10-CM

## 2021-04-21 DIAGNOSIS — E1151 Type 2 diabetes mellitus with diabetic peripheral angiopathy without gangrene: Secondary | ICD-10-CM | POA: Diagnosis not present

## 2021-04-21 DIAGNOSIS — Z8616 Personal history of COVID-19: Secondary | ICD-10-CM | POA: Diagnosis not present

## 2021-04-21 DIAGNOSIS — Z66 Do not resuscitate: Secondary | ICD-10-CM | POA: Diagnosis not present

## 2021-04-21 DIAGNOSIS — Z7189 Other specified counseling: Secondary | ICD-10-CM | POA: Diagnosis not present

## 2021-04-21 DIAGNOSIS — E1122 Type 2 diabetes mellitus with diabetic chronic kidney disease: Secondary | ICD-10-CM | POA: Diagnosis not present

## 2021-04-21 DIAGNOSIS — Z79899 Other long term (current) drug therapy: Secondary | ICD-10-CM

## 2021-04-21 DIAGNOSIS — I491 Atrial premature depolarization: Secondary | ICD-10-CM | POA: Diagnosis not present

## 2021-04-21 DIAGNOSIS — F39 Unspecified mood [affective] disorder: Secondary | ICD-10-CM | POA: Diagnosis present

## 2021-04-21 DIAGNOSIS — J80 Acute respiratory distress syndrome: Secondary | ICD-10-CM | POA: Diagnosis not present

## 2021-04-21 DIAGNOSIS — R0902 Hypoxemia: Secondary | ICD-10-CM | POA: Diagnosis not present

## 2021-04-21 DIAGNOSIS — R001 Bradycardia, unspecified: Secondary | ICD-10-CM | POA: Diagnosis present

## 2021-04-21 DIAGNOSIS — I429 Cardiomyopathy, unspecified: Secondary | ICD-10-CM | POA: Diagnosis present

## 2021-04-21 DIAGNOSIS — K219 Gastro-esophageal reflux disease without esophagitis: Secondary | ICD-10-CM | POA: Diagnosis present

## 2021-04-21 DIAGNOSIS — I5033 Acute on chronic diastolic (congestive) heart failure: Secondary | ICD-10-CM | POA: Diagnosis not present

## 2021-04-21 DIAGNOSIS — F419 Anxiety disorder, unspecified: Secondary | ICD-10-CM | POA: Diagnosis present

## 2021-04-21 DIAGNOSIS — Z801 Family history of malignant neoplasm of trachea, bronchus and lung: Secondary | ICD-10-CM

## 2021-04-21 DIAGNOSIS — I5023 Acute on chronic systolic (congestive) heart failure: Secondary | ICD-10-CM | POA: Diagnosis not present

## 2021-04-21 DIAGNOSIS — I7092 Chronic total occlusion of artery of the extremities: Secondary | ICD-10-CM | POA: Diagnosis not present

## 2021-04-21 DIAGNOSIS — I517 Cardiomegaly: Secondary | ICD-10-CM | POA: Diagnosis not present

## 2021-04-21 DIAGNOSIS — Z951 Presence of aortocoronary bypass graft: Secondary | ICD-10-CM

## 2021-04-21 DIAGNOSIS — I480 Paroxysmal atrial fibrillation: Secondary | ICD-10-CM | POA: Diagnosis present

## 2021-04-21 DIAGNOSIS — R54 Age-related physical debility: Secondary | ICD-10-CM | POA: Diagnosis present

## 2021-04-21 DIAGNOSIS — I472 Ventricular tachycardia: Secondary | ICD-10-CM | POA: Diagnosis not present

## 2021-04-21 DIAGNOSIS — R069 Unspecified abnormalities of breathing: Secondary | ICD-10-CM | POA: Diagnosis not present

## 2021-04-21 DIAGNOSIS — E739 Lactose intolerance, unspecified: Secondary | ICD-10-CM | POA: Diagnosis present

## 2021-04-21 DIAGNOSIS — E1165 Type 2 diabetes mellitus with hyperglycemia: Secondary | ICD-10-CM | POA: Diagnosis not present

## 2021-04-21 DIAGNOSIS — J9621 Acute and chronic respiratory failure with hypoxia: Secondary | ICD-10-CM | POA: Diagnosis not present

## 2021-04-21 DIAGNOSIS — Z8546 Personal history of malignant neoplasm of prostate: Secondary | ICD-10-CM

## 2021-04-21 DIAGNOSIS — Z7901 Long term (current) use of anticoagulants: Secondary | ICD-10-CM

## 2021-04-21 DIAGNOSIS — E43 Unspecified severe protein-calorie malnutrition: Secondary | ICD-10-CM | POA: Insufficient documentation

## 2021-04-21 DIAGNOSIS — J90 Pleural effusion, not elsewhere classified: Secondary | ICD-10-CM | POA: Diagnosis not present

## 2021-04-21 DIAGNOSIS — J811 Chronic pulmonary edema: Secondary | ICD-10-CM | POA: Diagnosis not present

## 2021-04-21 DIAGNOSIS — Z7902 Long term (current) use of antithrombotics/antiplatelets: Secondary | ICD-10-CM

## 2021-04-21 DIAGNOSIS — I2510 Atherosclerotic heart disease of native coronary artery without angina pectoris: Secondary | ICD-10-CM | POA: Diagnosis present

## 2021-04-21 DIAGNOSIS — I499 Cardiac arrhythmia, unspecified: Secondary | ICD-10-CM | POA: Diagnosis not present

## 2021-04-21 DIAGNOSIS — I447 Left bundle-branch block, unspecified: Secondary | ICD-10-CM | POA: Diagnosis present

## 2021-04-21 DIAGNOSIS — L89152 Pressure ulcer of sacral region, stage 2: Secondary | ICD-10-CM | POA: Diagnosis present

## 2021-04-21 LAB — CBC WITH DIFFERENTIAL/PLATELET
Abs Immature Granulocytes: 0.13 10*3/uL — ABNORMAL HIGH (ref 0.00–0.07)
Basophils Absolute: 0.1 10*3/uL (ref 0.0–0.1)
Basophils Relative: 1 %
Eosinophils Absolute: 0.1 10*3/uL (ref 0.0–0.5)
Eosinophils Relative: 1 %
HCT: 37.6 % — ABNORMAL LOW (ref 39.0–52.0)
Hemoglobin: 11.1 g/dL — ABNORMAL LOW (ref 13.0–17.0)
Immature Granulocytes: 1 %
Lymphocytes Relative: 31 %
Lymphs Abs: 4.8 10*3/uL — ABNORMAL HIGH (ref 0.7–4.0)
MCH: 26.1 pg (ref 26.0–34.0)
MCHC: 29.5 g/dL — ABNORMAL LOW (ref 30.0–36.0)
MCV: 88.3 fL (ref 80.0–100.0)
Monocytes Absolute: 0.9 10*3/uL (ref 0.1–1.0)
Monocytes Relative: 6 %
Neutro Abs: 9.6 10*3/uL — ABNORMAL HIGH (ref 1.7–7.7)
Neutrophils Relative %: 60 %
Platelets: 379 10*3/uL (ref 150–400)
RBC: 4.26 MIL/uL (ref 4.22–5.81)
RDW: 22.9 % — ABNORMAL HIGH (ref 11.5–15.5)
Smear Review: NORMAL
WBC: 15.5 10*3/uL — ABNORMAL HIGH (ref 4.0–10.5)
nRBC: 0 % (ref 0.0–0.2)

## 2021-04-21 LAB — D-DIMER, QUANTITATIVE: D-Dimer, Quant: 1.05 ug/mL-FEU — ABNORMAL HIGH (ref 0.00–0.50)

## 2021-04-21 LAB — BASIC METABOLIC PANEL
Anion gap: 7 (ref 5–15)
BUN: 23 mg/dL (ref 8–23)
CO2: 29 mmol/L (ref 22–32)
Calcium: 8.5 mg/dL — ABNORMAL LOW (ref 8.9–10.3)
Chloride: 99 mmol/L (ref 98–111)
Creatinine, Ser: 1.61 mg/dL — ABNORMAL HIGH (ref 0.61–1.24)
GFR, Estimated: 44 mL/min — ABNORMAL LOW (ref >60)
Glucose, Bld: 257 mg/dL — ABNORMAL HIGH (ref 70–99)
Potassium: 4.3 mmol/L (ref 3.5–5.1)
Sodium: 135 mmol/L (ref 135–145)

## 2021-04-21 LAB — TROPONIN I (HIGH SENSITIVITY): Troponin I (High Sensitivity): 33 ng/L — ABNORMAL HIGH (ref <18)

## 2021-04-21 LAB — RESP PANEL BY RT-PCR (FLU A&B, COVID) ARPGX2
Influenza A by PCR: NEGATIVE
Influenza B by PCR: NEGATIVE
SARS Coronavirus 2 by RT PCR: NEGATIVE

## 2021-04-21 LAB — CBG MONITORING, ED: Glucose-Capillary: 316 mg/dL — ABNORMAL HIGH (ref 70–99)

## 2021-04-21 LAB — PROCALCITONIN: Procalcitonin: 0.19 ng/mL

## 2021-04-21 LAB — LACTIC ACID, PLASMA: Lactic Acid, Venous: 2.5 mmol/L (ref 0.5–1.9)

## 2021-04-21 LAB — BRAIN NATRIURETIC PEPTIDE: B Natriuretic Peptide: 1488.9 pg/mL — ABNORMAL HIGH (ref 0.0–100.0)

## 2021-04-21 MED ORDER — FERROUS SULFATE 325 (65 FE) MG PO TABS
325.0000 mg | ORAL_TABLET | Freq: Two times a day (BID) | ORAL | Status: DC
  Administered 2021-04-22 – 2021-04-25 (×8): 325 mg via ORAL
  Filled 2021-04-21 (×8): qty 1

## 2021-04-21 MED ORDER — ASCORBIC ACID 500 MG PO TABS
500.0000 mg | ORAL_TABLET | Freq: Two times a day (BID) | ORAL | Status: DC
  Administered 2021-04-22: 500 mg via ORAL
  Filled 2021-04-21: qty 1

## 2021-04-21 MED ORDER — INSULIN ASPART 100 UNIT/ML IJ SOLN
0.0000 [IU] | Freq: Three times a day (TID) | INTRAMUSCULAR | Status: DC
  Administered 2021-04-21 – 2021-04-22 (×2): 11 [IU] via SUBCUTANEOUS
  Administered 2021-04-22: 8 [IU] via SUBCUTANEOUS
  Administered 2021-04-22 (×2): 5 [IU] via SUBCUTANEOUS
  Administered 2021-04-23: 3 [IU] via SUBCUTANEOUS
  Administered 2021-04-23: 8 [IU] via SUBCUTANEOUS
  Administered 2021-04-23: 2 [IU] via SUBCUTANEOUS
  Administered 2021-04-23: 8 [IU] via SUBCUTANEOUS
  Administered 2021-04-24: 5 [IU] via SUBCUTANEOUS
  Administered 2021-04-24: 3 [IU] via SUBCUTANEOUS
  Administered 2021-04-25 (×2): 5 [IU] via SUBCUTANEOUS
  Administered 2021-04-25: 2 [IU] via SUBCUTANEOUS
  Filled 2021-04-21 (×14): qty 1

## 2021-04-21 MED ORDER — BISACODYL 10 MG RE SUPP
10.0000 mg | Freq: Every day | RECTAL | Status: DC | PRN
  Filled 2021-04-21: qty 1

## 2021-04-21 MED ORDER — LACTASE 3000 UNITS PO TABS
3000.0000 [IU] | ORAL_TABLET | Freq: Three times a day (TID) | ORAL | Status: DC | PRN
  Filled 2021-04-21: qty 1

## 2021-04-21 MED ORDER — IPRATROPIUM-ALBUTEROL 20-100 MCG/ACT IN AERS: 2.0000 | INHALATION_SPRAY | Freq: Four times a day (QID) | RESPIRATORY_TRACT | Status: DC | PRN

## 2021-04-21 MED ORDER — B COMPLEX-C PO TABS
1.0000 | ORAL_TABLET | Freq: Every day | ORAL | Status: DC
  Filled 2021-04-21: qty 1

## 2021-04-21 MED ORDER — GLUCERNA SHAKE PO LIQD
237.0000 mL | Freq: Three times a day (TID) | ORAL | Status: DC
  Administered 2021-04-22 (×2): 237 mL via ORAL

## 2021-04-21 MED ORDER — ONDANSETRON HCL 4 MG PO TABS: 4.0000 mg | ORAL_TABLET | Freq: Four times a day (QID) | ORAL | Status: DC | PRN

## 2021-04-21 MED ORDER — POLYETHYLENE GLYCOL 3350 17 G PO PACK: 17.0000 g | PACK | Freq: Every day | ORAL | Status: DC

## 2021-04-21 MED ORDER — BUSPIRONE HCL 5 MG PO TABS
5.0000 mg | ORAL_TABLET | Freq: Two times a day (BID) | ORAL | Status: DC
  Administered 2021-04-22 – 2021-04-25 (×7): 5 mg via ORAL
  Filled 2021-04-21 (×9): qty 1

## 2021-04-21 MED ORDER — MAGNESIUM HYDROXIDE 400 MG/5ML PO SUSP: 30.0000 mL | Freq: Every day | ORAL | Status: DC | PRN

## 2021-04-21 MED ORDER — SODIUM CHLORIDE 0.9 % IV SOLN: 500.0000 mg | INTRAVENOUS | Status: DC

## 2021-04-21 MED ORDER — IPRATROPIUM-ALBUTEROL 0.5-2.5 (3) MG/3ML IN SOLN
3.0000 mL | Freq: Four times a day (QID) | RESPIRATORY_TRACT | Status: DC
  Administered 2021-04-22: 3 mL via RESPIRATORY_TRACT
  Filled 2021-04-21: qty 3

## 2021-04-21 MED ORDER — HYDROCORTISONE ACETATE 25 MG RE SUPP
25.0000 mg | Freq: Two times a day (BID) | RECTAL | Status: DC | PRN
  Administered 2021-04-22: 25 mg via RECTAL
  Filled 2021-04-21 (×3): qty 1

## 2021-04-21 MED ORDER — GUAIFENESIN ER 600 MG PO TB12
600.0000 mg | ORAL_TABLET | Freq: Two times a day (BID) | ORAL | Status: DC
  Administered 2021-04-22 – 2021-04-25 (×7): 600 mg via ORAL
  Filled 2021-04-21 (×7): qty 1

## 2021-04-21 MED ORDER — TORSEMIDE 20 MG PO TABS: 20.0000 mg | ORAL_TABLET | ORAL | Status: DC

## 2021-04-21 MED ORDER — CLOPIDOGREL BISULFATE 75 MG PO TABS
75.0000 mg | ORAL_TABLET | Freq: Every day | ORAL | Status: DC
  Administered 2021-04-22 – 2021-04-25 (×4): 75 mg via ORAL
  Filled 2021-04-21 (×4): qty 1

## 2021-04-21 MED ORDER — PANTOPRAZOLE SODIUM 40 MG PO TBEC
40.0000 mg | DELAYED_RELEASE_TABLET | Freq: Every day | ORAL | Status: DC
  Administered 2021-04-22 – 2021-04-25 (×4): 40 mg via ORAL
  Filled 2021-04-21 (×4): qty 1

## 2021-04-21 MED ORDER — PREDNISONE 20 MG PO TABS: 40.0000 mg | ORAL_TABLET | Freq: Every day | ORAL | Status: DC

## 2021-04-21 MED ORDER — CRANBERRY EXTRACT 250 MG PO TABS: 500.0000 mg | ORAL_TABLET | Freq: Every morning | ORAL | Status: DC

## 2021-04-21 MED ORDER — ACETAMINOPHEN 325 MG PO TABS: 650.0000 mg | ORAL_TABLET | Freq: Four times a day (QID) | ORAL | Status: DC | PRN

## 2021-04-21 MED ORDER — SODIUM CHLORIDE 0.9 % IV SOLN
2.0000 g | INTRAVENOUS | Status: DC
  Administered 2021-04-21: 2 g via INTRAVENOUS
  Filled 2021-04-21: qty 20

## 2021-04-21 MED ORDER — METHYLPREDNISOLONE SODIUM SUCC 40 MG IJ SOLR
40.0000 mg | Freq: Four times a day (QID) | INTRAMUSCULAR | Status: DC
  Administered 2021-04-21 – 2021-04-22 (×2): 40 mg via INTRAVENOUS
  Filled 2021-04-21 (×2): qty 1

## 2021-04-21 MED ORDER — SENNA 8.6 MG PO TABS: 1.0000 | ORAL_TABLET | Freq: Every day | ORAL | Status: DC

## 2021-04-21 MED ORDER — LEVALBUTEROL HCL 1.25 MG/0.5ML IN NEBU
1.2500 mg | INHALATION_SOLUTION | Freq: Once | RESPIRATORY_TRACT | Status: AC
  Administered 2021-04-21: 1.25 mg via RESPIRATORY_TRACT
  Filled 2021-04-21: qty 0.5

## 2021-04-21 MED ORDER — NITROGLYCERIN 2 % TD OINT
1.0000 [in_us] | TOPICAL_OINTMENT | TRANSDERMAL | Status: AC
  Administered 2021-04-21: 1 [in_us] via TOPICAL
  Filled 2021-04-21: qty 1

## 2021-04-21 MED ORDER — IPRATROPIUM-ALBUTEROL 0.5-2.5 (3) MG/3ML IN SOLN
3.0000 mL | Freq: Once | RESPIRATORY_TRACT | Status: AC
  Administered 2021-04-21: 3 mL via RESPIRATORY_TRACT
  Filled 2021-04-21: qty 3

## 2021-04-21 MED ORDER — ACETAMINOPHEN 650 MG RE SUPP: 650.0000 mg | Freq: Four times a day (QID) | RECTAL | Status: DC | PRN

## 2021-04-21 MED ORDER — ONDANSETRON HCL 4 MG/2ML IJ SOLN: 4.0000 mg | Freq: Four times a day (QID) | INTRAMUSCULAR | Status: DC | PRN

## 2021-04-21 MED ORDER — POLYETHYLENE GLYCOL 3350 17 G PO PACK: 17.0000 g | PACK | Freq: Every day | ORAL | Status: DC | PRN

## 2021-04-21 MED ORDER — GABAPENTIN 100 MG PO CAPS
100.0000 mg | ORAL_CAPSULE | Freq: Two times a day (BID) | ORAL | Status: DC
  Administered 2021-04-22: 100 mg via ORAL
  Filled 2021-04-21: qty 1

## 2021-04-21 MED ORDER — ENOXAPARIN SODIUM 40 MG/0.4ML IJ SOSY: 40.0000 mg | PREFILLED_SYRINGE | INTRAMUSCULAR | Status: DC

## 2021-04-21 MED ORDER — HYDROCOD POLST-CPM POLST ER 10-8 MG/5ML PO SUER: 5.0000 mL | Freq: Two times a day (BID) | ORAL | Status: DC | PRN

## 2021-04-21 MED ORDER — OCUVITE-LUTEIN PO CAPS
1.0000 | ORAL_CAPSULE | Freq: Every day | ORAL | Status: DC
  Administered 2021-04-22 – 2021-04-25 (×4): 1 via ORAL
  Filled 2021-04-21 (×5): qty 1

## 2021-04-21 MED ORDER — VITAMIN D 25 MCG (1000 UNIT) PO TABS: 1000.0000 [IU] | ORAL_TABLET | Freq: Every day | ORAL | Status: DC

## 2021-04-21 MED ORDER — SIMVASTATIN 20 MG PO TABS
20.0000 mg | ORAL_TABLET | Freq: Every evening | ORAL | Status: DC
  Administered 2021-04-22 – 2021-04-25 (×4): 20 mg via ORAL
  Filled 2021-04-21 (×4): qty 1

## 2021-04-21 MED ORDER — CINNAMON 500 MG PO CAPS: 1000.0000 mg | ORAL_CAPSULE | Freq: Two times a day (BID) | ORAL | Status: DC

## 2021-04-21 MED ORDER — APIXABAN 2.5 MG PO TABS
2.5000 mg | ORAL_TABLET | Freq: Two times a day (BID) | ORAL | Status: DC
  Administered 2021-04-22 – 2021-04-25 (×7): 2.5 mg via ORAL
  Filled 2021-04-21 (×8): qty 1

## 2021-04-21 MED ORDER — RISAQUAD PO CAPS
1.0000 | ORAL_CAPSULE | Freq: Every morning | ORAL | Status: DC
  Administered 2021-04-22: 1 via ORAL
  Filled 2021-04-21: qty 1

## 2021-04-21 MED ORDER — SODIUM CHLORIDE 0.9 % IV SOLN
500.0000 mg | Freq: Once | INTRAVENOUS | Status: AC
  Administered 2021-04-21: 500 mg via INTRAVENOUS
  Filled 2021-04-21: qty 500

## 2021-04-21 MED ORDER — TIOTROPIUM BROMIDE MONOHYDRATE 18 MCG IN CAPS
18.0000 ug | ORAL_CAPSULE | Freq: Every day | RESPIRATORY_TRACT | Status: DC
  Administered 2021-04-22: 18 ug via RESPIRATORY_TRACT
  Filled 2021-04-21: qty 5

## 2021-04-21 MED ORDER — FUROSEMIDE 10 MG/ML IJ SOLN
40.0000 mg | Freq: Two times a day (BID) | INTRAMUSCULAR | Status: DC
  Administered 2021-04-22 – 2021-04-25 (×7): 40 mg via INTRAVENOUS
  Filled 2021-04-21 (×7): qty 4

## 2021-04-21 MED ORDER — INSULIN GLARGINE 100 UNIT/ML ~~LOC~~ SOLN
15.0000 [IU] | Freq: Every day | SUBCUTANEOUS | Status: DC
  Administered 2021-04-22 – 2021-04-24 (×3): 15 [IU] via SUBCUTANEOUS
  Filled 2021-04-21 (×4): qty 0.15

## 2021-04-21 MED ORDER — TRAZODONE HCL 50 MG PO TABS: 25.0000 mg | ORAL_TABLET | Freq: Every evening | ORAL | Status: DC | PRN

## 2021-04-21 MED ORDER — TRAZODONE HCL 50 MG PO TABS
50.0000 mg | ORAL_TABLET | Freq: Every day | ORAL | Status: DC
  Administered 2021-04-22 – 2021-04-24 (×3): 50 mg via ORAL
  Filled 2021-04-21 (×3): qty 1

## 2021-04-21 MED ORDER — NICOTINE 21 MG/24HR TD PT24
21.0000 mg | MEDICATED_PATCH | Freq: Every day | TRANSDERMAL | Status: DC
  Administered 2021-04-22 – 2021-04-25 (×4): 21 mg via TRANSDERMAL
  Filled 2021-04-21 (×4): qty 1

## 2021-04-21 MED ORDER — ALBUTEROL SULFATE HFA 108 (90 BASE) MCG/ACT IN AERS: 2.0000 | INHALATION_SPRAY | Freq: Four times a day (QID) | RESPIRATORY_TRACT | Status: DC | PRN

## 2021-04-21 MED ORDER — FUROSEMIDE 10 MG/ML IJ SOLN
60.0000 mg | Freq: Once | INTRAMUSCULAR | Status: AC
  Administered 2021-04-21: 60 mg via INTRAVENOUS
  Filled 2021-04-21: qty 8

## 2021-04-21 NOTE — ED Notes (Signed)
Wife at bedside.

## 2021-04-21 NOTE — ED Notes (Signed)
X-ray at bedside

## 2021-04-21 NOTE — ED Provider Notes (Signed)
[facility] Emergency Department Provider Note   ____________________________________________   Event Date/Time   First MD Initiated Contact with Patient 04/21/21 Bosie Helper     (approximate)  I have reviewed the triage vital signs and the nursing notes.   HISTORY  Chief Complaint Respiratory Distress  EM caveat acute respiratory distress  HPI [name] is a 75 y.o. male history of CHF COPD coronary disease diabetes previous stroke A. fib Patient reportedly had acute onset shortness of breath developing over the last approximately 1 hour at his rehab facility.  EMS advised patient had brief run of ventricular tachycardia, also significant hypoxia and severe increased work of breathing.  Possible crackles though diminished lung sounds also, patient treated with 125 mg of Solu-Medrol.  Attempted albuterol was utilized but felt to potentially worsen his ventricular ectopy and was just discontinued by EMS  Patient advises shortness of breath.  He is unable to communicate much further but does advise via head-nodding that he is not having any pain.  No chest pain.  He is able to speak a little bit over BiPAP, and does affirm DNR status   Past Medical History:  Diagnosis Date   Arrhythmia    atrial fibrillation   Atrial fibrillation ([place])    Basal cell carcinoma 05/2019   right nasal ala, Tx: [facility]   Basal cell carcinoma 04/12/2019   Left posterior ear. Nodular pattern, excoriated.    CHF (congestive heart failure) (HCC)    COPD (chronic obstructive pulmonary disease) (HCC)    Coronary artery disease    Diabetes mellitus without complication (HCC)    GERD (gastroesophageal reflux disease)    HLD (hyperlipidemia)    Hypertension    Peripheral vascular disease (HCC)    Pneumonia    Prostate cancer ([place])    Squamous cell carcinoma of skin 08/10/2018   Left upper arm above elbow. WD SCC.   Squamous cell carcinoma of skin 09/07/2018   Right forearm, below  elbow. WD SCC. [facility] 01/10/2019.   Stroke [facility])     Patient Active Problem List   Diagnosis Date Noted   Acute hemorrhoid 04/19/2021   SOB (shortness of breath)    Pressure injury of sacral region, stage 2 ([place]) 04/06/2021   Hyponatremia 04/02/2021   Pressure injury of skin 02/20/2021   AKI (acute kidney injury) ([place]) 02/19/2021   Type 2 diabetes mellitus with hypoglycemia without coma ([place]) 02/19/2021   Bradycardia 02/19/2021   Weakness 02/19/2021   Dental infection 02/19/2021   Acute respiratory failure ([place]) 01/28/2021   Acute renal failure superimposed on stage 3b chronic kidney disease ([place]) 01/28/2021   Atrial fibrillation, chronic ([place]) 01/28/2021   Acute CHF (congestive heart failure) ([place]) 11/15/2020   Chronic diastolic CHF (congestive heart failure) ([place]) 10/24/2020   Troponin level elevated 10/24/2020   CKD (chronic kidney disease), stage IV ([place]) 10/24/2020   Malnutrition of moderate degree 12/27/2019   Hyperglycemia due to type 2 diabetes mellitus ([place]) 12/26/2019   Chronic kidney disease (CKD) stage G3b/A3, moderately decreased glomerular filtration rate (GFR) between 30-44 mL/min/1.73 square meter and albuminuria creatinine ratio greater than 300 mg/g ([place]) 12/26/2019   History of COVID-19 12/26/2019   History of CVA (cerebrovascular accident) 12/26/2019   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   History of second degree heart block 12/26/2019   Elevated LFTs 12/26/2019   Malignant neoplasm of prostate ([place]) 11/24/2016   Bright red rectal bleeding 92/09/9416   Chronic systolic heart failure ([place]) 12/12/2015   Essential hypertension  12/12/2015   COPD (chronic obstructive pulmonary disease) with chronic bronchitis ([place]) 12/12/2015   Tobacco abuse 12/12/2015   Difficulty walking 05/11/2014    Past Surgical History:  Procedure Laterality Date   COLONOSCOPY     CORONARY ARTERY BYPASS GRAFT  2006   EYE SURGERY     GOLD SEED IMPLANT N/A 12/30/2016   Procedure: GOLD SEED IMPLANT  x3;   Surgeon: Hollice Espy, MD;  Location: [facility] ORS;  Service: Urology;  Laterality: N/A;   HEMORRHOID SURGERY N/A 10/02/2016   Procedure: PROCTOSCOPY AND CONTROL OF RECTAL BLEEDING;  Surgeon: Excell Seltzer, MD;  Location: [facility] ORS;  Service: General;  Laterality: N/A;   PROSTATE BIOPSY     TONSILLECTOMY      Prior to Admission medications   Medication Sig Start Date End Date Taking? Authorizing Provider  acetaminophen (TYLENOL) 325 MG tablet Take 2 tablets (650 mg total) by mouth every 6 (six) hours as needed for mild pain (or Fever >/= 101). 01/20/16   Gouru, Illene Silver, MD  albuterol (PROVENTIL HFA;VENTOLIN HFA) 108 (90 Base) MCG/ACT inhaler Inhale 2 puffs into the lungs every 6 (six) hours as needed for wheezing or shortness of breath.    [provider]  apixaban (ELIQUIS) 2.5 MG TABS tablet Take 2.5 mg by mouth every 12 (twelve) hours.    [provider]  B Complex CAPS Take 1 capsule by mouth daily. 03/04/21   [provider]  bifidobacterium infantis (ALIGN) capsule Take 1 capsule by mouth every morning.    [provider]  bisacodyl (DULCOLAX) 10 MG suppository Place 10 mg rectally daily as needed for moderate constipation.    [provider]  busPIRone (BUSPAR) 5 MG tablet Take 1 tablet by mouth 2 (two) times daily. 04/09/21   [provider]  cholecalciferol (VITAMIN D) 1000 units tablet Take 1,000 Units by mouth daily.    [provider]  Cinnamon 500 MG capsule Take 1,000 mg by mouth 2 (two) times daily.    [provider]  clopidogrel (PLAVIX) 75 MG tablet Take 1 tablet (75 mg total) by mouth daily. 04/08/21   Lorella Nimrod, MD  COMBIVENT RESPIMAT 20-100 MCG/ACT AERS respimat Inhale 2 puffs into the lungs 4 (four) times daily as needed. 09/18/20   [provider]  CRANBERRY EXTRACT PO Take 500 mg by mouth every morning.     [provider]  feeding supplement, GLUCERNA SHAKE, (GLUCERNA SHAKE) LIQD Take 237  mLs by mouth 3 (three) times daily between meals.    [provider]  ferrous sulfate 325 (65 FE) MG tablet Take 1 tablet (325 mg total) by mouth 2 (two) times daily with a meal. 04/08/21   Lorella Nimrod, MD  gabapentin (NEURONTIN) 100 MG capsule Take 100 mg by mouth 2 (two) times daily.    [provider]  glucose 4 GM chewable tablet Chew 1 tablet by mouth as needed for low blood sugar.    [provider]  hydrocortisone (ANUSOL-HC) 25 MG suppository Place 25 mg rectally in the morning, at noon, and at bedtime. Pt may administer    [provider]  insulin aspart (NOVOLOG) 100 UNIT/ML injection 0-9 Units, Subcutaneous, 3 times daily with meals CBG < 70: Implement Hypoglycemia protocol/measures CBG 70 - 120: 0 units CBG 121 - 150: 1 unit CBG 151 - 200: 2 units CBG 201 - 250: 3 units CBG 251 - 300: 5 units CBG 301 - 350: 7 units CBG 351 - 400: 9 units  CBG > 400: call MD 10/15/19   Jonetta Osgood, MD  lactase (LACTAID) 3000 units tablet Take 3,000 Units by mouth 3 (three) times daily between meals as needed.    [provider]  Lactobacillus (ACIDOPHILUS) TABS Take 1 tablet by mouth daily. 04/08/21   [provider]  LANTUS SOLOSTAR 100 UNIT/ML Solostar Pen Inject 15 Units into the skin daily at 10 pm. 03/02/21   Wouk, Ailene Rud, MD  multivitamin-lutein [facility]) CAPS capsule Take 1 capsule by mouth daily.    [provider]  nicotine (NICODERM CQ - DOSED IN MG/24 HOURS) 21 mg/24hr patch Place 21 mg onto the skin daily.    [provider]  omeprazole (PRILOSEC) 20 MG capsule Take 20 mg by mouth daily.    [provider]  polyethylene glycol (MIRALAX / GLYCOLAX) 17 g packet Take 17 g by mouth daily as needed for mild constipation. 04/08/21   Lorella Nimrod, MD  polyethylene glycol (MIRALAX / GLYCOLAX) 17 g packet Take 17 g by mouth daily.    [provider]  senna (SENOKOT) 8.6 MG TABS tablet Take 1  tablet by mouth daily.    [provider]  simvastatin (ZOCOR) 20 MG tablet Take 20 mg by mouth in the morning.    [provider]  tiotropium (SPIRIVA HANDIHALER) 18 MCG inhalation capsule Place 1 capsule (18 mcg total) into inhaler and inhale daily. 02/05/21 05/06/21  Enzo Bi, MD  torsemide [facility] Dt) 20 MG tablet Take 1 tablet (20 mg total) by mouth every Tuesday, Thursday, Saturday, and Sunday. 04/09/21   Lorella Nimrod, MD  traZODone (DESYREL) 50 MG tablet Take 50 mg by mouth at bedtime.    [provider]  vitamin C (ASCORBIC ACID) 500 MG tablet Take 500 mg by mouth 2 (two) times daily.     [provider]    Allergies Lactose intolerance (gi)  Family History  Problem Relation Age of Onset   Lung cancer Mother    Heart attack Father    Hypertension Other    Cancer Maternal Grandfather        prostate   Cancer Paternal Grandmother        breast    Social History Social History   Tobacco Use   Smoking status: Former    Packs/day: 1.00    Years: 54.00    Pack years: 54.00    Types: Cigarettes   Smokeless tobacco: Never  Vaping Use   Vaping Use: Never used  Substance Use Topics   Alcohol use: No    Alcohol/week: 0.0 standard drinks   Drug use: No    Review of Systems  EM caveat    ____________________________________________   PHYSICAL EXAM:  VITAL SIGNS: ED Triage Vitals  Enc Vitals Group     BP 04/21/21 1836 (!) 184/77     Pulse Rate 04/21/21 1836 90     Resp 04/21/21 1836 (!) 26     Temp --      Temp src --      SpO2 04/21/21 1830 100 %     Weight --      Height --      Head Circumference --      Peak Flow --      Pain Score --      Pain Loc --      Pain Edu? --      Excl. in [place]? --     Constitutional: Alert and oriented.  Patient has  severe respiratory distress with tripoding, improving after placed on BiPAP from EMS CPAP.  I was present on arrival to the ED Eyes: Conjunctivae are normal. Head:  Atraumatic. Nose: No congestion/rhinnorhea. Mouth/Throat: Mucous membranes are moist. Neck: No stridor.  Cardiovascular: Slightly irregular.  Normal rate.  Grossly normal heart sounds.  Good peripheral circulation. Respiratory: Severely diminished lung sounds bilateral.  No crackles.  He does not obviously exhibit any wheezing either.  The patient has what I would consider an almost "silent chest" and appears to have severe respiratory distress Gastrointestinal: Soft and nontender. No distention. Musculoskeletal: No lower extremity tenderness trace edema in the feet and ankles bilaterally. Neurologic:  Normal speech and language but can really only speak 1 or 2 words at a time of her BiPAP, appears very dyspneic.  Skin:  Skin is warm, dry and intact. No rash noted. Psychiatric: Mood and affect are anxious   ____________________________________________   LABS (all labs ordered are listed, but only abnormal results are displayed)  Labs Reviewed  CBC WITH DIFFERENTIAL/PLATELET - Abnormal; Notable for the following components:      Result Value   WBC 15.5 (*)    Hemoglobin 11.1 (*)    HCT 37.6 (*)    MCHC 29.5 (*)    RDW 22.9 (*)    Neutro Abs 9.6 (*)    Lymphs Abs 4.8 (*)    Abs Immature Granulocytes 0.13 (*)    All other components within normal limits  BASIC METABOLIC PANEL - Abnormal; Notable for the following components:   Glucose, Bld 257 (*)    Creatinine, Ser 1.61 (*)    Calcium 8.5 (*)    GFR, Estimated 44 (*)    All other components within normal limits  BRAIN NATRIURETIC PEPTIDE - Abnormal; Notable for the following components:   B Natriuretic Peptide 1,488.9 (*)    All other components within normal limits  D-DIMER, QUANTITATIVE - Abnormal; Notable for the following components:   D-Dimer, Quant 1.05 (*)    All other components within normal limits  LACTIC ACID, PLASMA - Abnormal; Notable for the following components:   Lactic Acid, Venous 2.5 (*)    All other  components within normal limits  TROPONIN I (HIGH SENSITIVITY) - Abnormal; Notable for the following components:   Troponin I (High Sensitivity) 33 (*)    All other components within normal limits  RESP PANEL BY RT-PCR (FLU A&B, COVID) ARPGX2  CULTURE, BLOOD (SINGLE)  CULTURE, BLOOD (ROUTINE X 2)  CULTURE, BLOOD (ROUTINE X 2)  EXPECTORATED SPUTUM ASSESSMENT W GRAM STAIN, RFLX TO RESP C  PROCALCITONIN  BASIC METABOLIC PANEL  CBC  LEGIONELLA PNEUMOPHILA SEROGP 1 UR AG  STREP PNEUMONIAE URINARY ANTIGEN  POC SARS CORONAVIRUS 2 AG -  ED   ____________________________________________  EKG  I reviewed inter by me at 1835 Heart rate 96 QRS 120 QTc 420 Incomplete left bundle branch block appearance.  Somewhat irregular, suspect possible flutter versus coarse A. fib.  No obvious evidence of acute STEMI but cannot exclude ischemic change.  Of note the patient denies chest pain ____________________________________________  RADIOLOGY  DG Chest Port 1 View  Result Date: 04/21/2021 CLINICAL DATA:  Respiratory distress EXAM: PORTABLE CHEST 1 VIEW COMPARISON:  04/07/2021 FINDINGS: Post CABG changes. Stable cardiomegaly. Pulmonary vascular congestion with diffuse alveolar and interstitial opacities bilaterally. Probable small bilateral pleural effusions. No pneumothorax. IMPRESSION: Findings suggestive of CHF with pulmonary edema. A superimposed infectious process would be difficult to exclude. Electronically Signed   By: Hart Carwin  Plundo D.O.   On: 04/21/2021 19:07      Chest x-rays personally viewed by me, interpreted as probable CHF difficult to exclude infectious process. ____________________________________________   PROCEDURES  Procedure(s) performed: None  Procedures  Critical Care performed: Yes, see critical care note(s)  CRITICAL CARE Performed by: Delman Kitten   Total critical care time: 40 minutes  Critical care time was exclusive of separately billable procedures and  treating other patients.  Critical care was necessary to treat or prevent imminent or life-threatening deterioration.  Critical care was time spent personally by me on the following activities: development of treatment plan with patient and/or surrogate as well as nursing, discussions with consultants, evaluation of patient's response to treatment, examination of patient, obtaining history from patient or surrogate, ordering and performing treatments and interventions, ordering and review of laboratory studies, ordering and review of radiographic studies, pulse oximetry and re-evaluation of patient's condition.  ____________________________________________   INITIAL IMPRESSION / ASSESSMENT AND PLAN / ED COURSE  Pertinent labs & imaging results that were available during my care of the patient were reviewed by me and considered in my medical decision making (see chart for details).   Patient presents with rather abrupt and severe onset respiratory distress.  Differential diagnosis is broad, however [place] medical history includes CHF, COPD, no chest pain, but also entertain NSTEMI, CAD, pulmonary embolism etc.  He does not have any secondary signs or symptoms of PE, but certainly this would be a consideration however await further testing including evaluation of chest imaging etc.  Emergency treatment initiated including BiPAP which she is tolerating well  Clinical Course as of 04/21/21 2208  Sun Apr 21, 2021  1838 Patient has valid DNR with him. Patient affirms with me that he would NOT want to be on a ventilator or have a breathing tube. In addition he would NOT want CPR or cardiac resucitation. Patient affirms DNR status. [MQ]    Clinical Course User Index [MQ] Delman Kitten, MD    ----------------------------------------- 7:25 PM on 04/21/2021 ----------------------------------------- The patient's work of breathing has improved markedly.  He appears much more comfortable.  His wife is  now at the bedside, both understanding agreeable with plan for admission, need for further work-up etc.  At this point I did discuss and believe that CHF likely the driving factor here, though he does also have a noted elevated white blood cell count.  His QTC is normal, and at this point plan to initiate azithromycin though I do not have obvious signs of pneumonia at this point but given his presentation and leukocytosis I am a bit concerned that underlying infectious process could be potential  ----------------------------------------- 8:33 PM on 04/21/2021 ----------------------------------------- Patient work of breathing improving, he has been responding well to treatments.  Comfortably respirating now on BiPAP still slightly tachypneic but overall much improved.  Fully awake and alert.  Patient will be admitted to the hospitalist service, discussed case with Dr. Sidney Ace  Of note, did discuss with the hospitalist that we have not excluded pulmonary embolism as a potential etiology, however based on the patient's clinical presentation and response to treatment lack of chest pain I do feel that CHF is the primary driver here, plus minus potential for infectious etiology.  Patient will be admitted, at this point do not feel that the patient would be a good candidate for CT angiography given his respiratory symptoms possible CHF, and I suspect risk benefits of having him lay flat for approximately 10 or so minutes  to get imaging would be outweighing the potential benefit.  Patient will continue to be clinically followed by hospitalist service ____________________________________________   FINAL CLINICAL IMPRESSION(S) / ED DIAGNOSES  Final diagnoses:  Acute respiratory failure with hypoxia [facility])        Note:  This document was prepared using Dragon voice recognition software and may include unintentional dictation errors       Delman Kitten, MD 04/21/21 2209

## 2021-04-21 NOTE — H&P (Addendum)
[place]   PATIENT NAME: [name]    MR#:  [id number]  DATE OF BIRTH:  02-09-1946  DATE OF ADMISSION:  04/21/2021  PRIMARY CARE PHYSICIAN: [name], MD   Patient is coming from: SNF/rehabilitation.  REQUESTING/REFERRING PHYSICIAN: Delman Kitten, MD  CHIEF COMPLAINT:   Chief Complaint  Patient presents with   Respiratory Distress    HISTORY OF PRESENT ILLNESS:  [name] is a 75 y.o. Caucasian male with medical history significant for atrial fibrillation on Eliquis, COPD, CHF, type 2 diabetes mellitus, GERD, dyslipidemia, hypertension, CVA and peripheral vascular disease, who presented to the emergency room with acute onset over the last hour prior to arrival to the ER at his rehab facility.  EMS reported a run of ventricular tachycardia and significant hypoxia with increased work of breathing.  The patient was given 125 mg of IV Solu-Medrol however nebulized bronchodilator therapy with albuterol was discontinued given his worsening ventricular ectopy.  The patient was unable to give history due to significant respiratory distress.  He confirmed his DNR status though from the BiPAP mask that was placed.  ED Course: Upon presentation to the emergency room, Blood pressure was 184/77 with respiratory rate of 26 and pulse oximetry was 100% on BiPAP.  Labs revealed blood glucose of 257 and creatinine was 1.61.  BNP was 1488.9 and high-sensitivity troponin I was 33.  Lactic acid was 3.5 and procalcitonin 0.19.  CBC showed leukocytosis of 15.5 with neutrophilia and anemia.  D-dimer was 1.05. EKG as reviewed by me : Showed atrial fibrillation with controlled ventricular sponsor of 96 and nonspecific interventricular conduction delay and lateral T wave inversion. Imaging: Chest x-ray showed findings suggestive of CHF with pulmonary edema.  Superimposed infectious process will be difficult to exclude.  The patient was given 60 mg of IV Lasix, duo nebs, Xopenex, 1 inch of  Nitropaste and IV Zithromax.  The patient will be admitted to a progressive unit bed for further evaluation and management. PAST MEDICAL HISTORY:   Past Medical History:  Diagnosis Date   Arrhythmia    atrial fibrillation   Atrial fibrillation ([place])    Basal cell carcinoma 05/2019   right nasal ala, Tx: [facility]   Basal cell carcinoma 04/12/2019   Left posterior ear. Nodular pattern, excoriated.    CHF (congestive heart failure) (HCC)    COPD (chronic obstructive pulmonary disease) (HCC)    Coronary artery disease    Diabetes mellitus without complication (HCC)    GERD (gastroesophageal reflux disease)    HLD (hyperlipidemia)    Hypertension    Peripheral vascular disease (HCC)    Pneumonia    Prostate cancer ([place])    Squamous cell carcinoma of skin 08/10/2018   Left upper arm above elbow. [facility].   Squamous cell carcinoma of skin 09/07/2018   Right forearm, below elbow. [facility]. [facility] 01/10/2019.   Stroke [facility])     PAST SURGICAL HISTORY:   Past Surgical History:  Procedure Laterality Date   COLONOSCOPY     CORONARY ARTERY BYPASS GRAFT  2006   EYE SURGERY     GOLD SEED IMPLANT N/A 12/30/2016   Procedure: GOLD SEED IMPLANT  x3;  Surgeon: Hollice Espy, MD;  Location: [facility] ORS;  Service: Urology;  Laterality: N/A;   HEMORRHOID SURGERY N/A 10/02/2016   Procedure: PROCTOSCOPY AND CONTROL OF RECTAL BLEEDING;  Surgeon: Excell Seltzer, MD;  Location: [facility] ORS;  Service: General;  Laterality: N/A;   PROSTATE BIOPSY  TONSILLECTOMY      SOCIAL HISTORY:   Social History   Tobacco Use   Smoking status: Former    Packs/day: 1.00    Years: 54.00    Pack years: 54.00    Types: Cigarettes   Smokeless tobacco: Never  Substance Use Topics   Alcohol use: No    Alcohol/week: 0.0 standard drinks    FAMILY HISTORY:   Family History  Problem Relation Age of Onset   Lung cancer Mother    Heart attack Father    Hypertension Other    Cancer Maternal Grandfather        prostate    Cancer Paternal Grandmother        breast    DRUG ALLERGIES:   Allergies  Allergen Reactions   Lactose Intolerance (Gi)     REVIEW OF SYSTEMS:   ROS As per history of present illness otherwise unobtainable due to his severe respiratory distress. MEDICATIONS AT HOME:   Prior to Admission medications   Medication Sig Start Date End Date Taking? Authorizing Provider  acetaminophen (TYLENOL) 325 MG tablet Take 2 tablets (650 mg total) by mouth every 6 (six) hours as needed for mild pain (or Fever >/= 101). 01/20/16   Gouru, Illene Silver, MD  albuterol (PROVENTIL HFA;VENTOLIN HFA) 108 (90 Base) MCG/ACT inhaler Inhale 2 puffs into the lungs every 6 (six) hours as needed for wheezing or shortness of breath.    [provider]  apixaban (ELIQUIS) 2.5 MG TABS tablet Take 2.5 mg by mouth every 12 (twelve) hours.    [provider]  B Complex CAPS Take 1 capsule by mouth daily. 03/04/21   [provider]  bifidobacterium infantis (ALIGN) capsule Take 1 capsule by mouth every morning.    [provider]  bisacodyl (DULCOLAX) 10 MG suppository Place 10 mg rectally daily as needed for moderate constipation.    [provider]  busPIRone (BUSPAR) 5 MG tablet Take 1 tablet by mouth 2 (two) times daily. 04/09/21   [provider]  cholecalciferol (VITAMIN D) 1000 units tablet Take 1,000 Units by mouth daily.    [provider]  Cinnamon 500 MG capsule Take 1,000 mg by mouth 2 (two) times daily.    [provider]  clopidogrel (PLAVIX) 75 MG tablet Take 1 tablet (75 mg total) by mouth daily. 04/08/21   Lorella Nimrod, MD  COMBIVENT RESPIMAT 20-100 MCG/ACT AERS respimat Inhale 2 puffs into the lungs 4 (four) times daily as needed. 09/18/20   [provider]  CRANBERRY EXTRACT PO Take 500 mg by mouth every morning.     [provider]  feeding supplement, GLUCERNA SHAKE, (GLUCERNA SHAKE) LIQD Take 237 mLs by mouth 3 (three) times  daily between meals.    [provider]  ferrous sulfate 325 (65 FE) MG tablet Take 1 tablet (325 mg total) by mouth 2 (two) times daily with a meal. 04/08/21   Lorella Nimrod, MD  gabapentin (NEURONTIN) 100 MG capsule Take 100 mg by mouth 2 (two) times daily.    [provider]  glucose 4 GM chewable tablet Chew 1 tablet by mouth as needed for low blood sugar.    [provider]  hydrocortisone (ANUSOL-HC) 25 MG suppository Place 25 mg rectally in the morning, at noon, and at bedtime. Pt may administer    [provider]  insulin aspart (NOVOLOG) 100 UNIT/ML injection 0-9 Units, Subcutaneous, 3 times daily with meals CBG < 70: Implement Hypoglycemia protocol/measures CBG 70 -  120: 0 units CBG 121 - 150: 1 unit CBG 151 - 200: 2 units CBG 201 - 250: 3 units CBG 251 - 300: 5 units CBG 301 - 350: 7 units CBG 351 - 400: 9 units CBG > 400: call MD 10/15/19   Jonetta Osgood, MD  lactase (LACTAID) 3000 units tablet Take 3,000 Units by mouth 3 (three) times daily between meals as needed.    [provider]  Lactobacillus (ACIDOPHILUS) TABS Take 1 tablet by mouth daily. 04/08/21   [provider]  LANTUS SOLOSTAR 100 UNIT/ML Solostar Pen Inject 15 Units into the skin daily at 10 pm. 03/02/21   Wouk, Ailene Rud, MD  multivitamin-lutein [facility]) CAPS capsule Take 1 capsule by mouth daily.    [provider]  nicotine (NICODERM CQ - DOSED IN MG/24 HOURS) 21 mg/24hr patch Place 21 mg onto the skin daily.    [provider]  omeprazole (PRILOSEC) 20 MG capsule Take 20 mg by mouth daily.    [provider]  polyethylene glycol (MIRALAX / GLYCOLAX) 17 g packet Take 17 g by mouth daily as needed for mild constipation. 04/08/21   Lorella Nimrod, MD  polyethylene glycol (MIRALAX / GLYCOLAX) 17 g packet Take 17 g by mouth daily.    [provider]  senna (SENOKOT) 8.6 MG TABS tablet Take 1 tablet by mouth daily.     [provider]  simvastatin (ZOCOR) 20 MG tablet Take 20 mg by mouth in the morning.    [provider]  tiotropium (SPIRIVA HANDIHALER) 18 MCG inhalation capsule Place 1 capsule (18 mcg total) into inhaler and inhale daily. 02/05/21 05/06/21  Enzo Bi, MD  torsemide [facility]) 20 MG tablet Take 1 tablet (20 mg total) by mouth every Tuesday, Thursday, Saturday, and Sunday. 04/09/21   Lorella Nimrod, MD  traZODone (DESYREL) 50 MG tablet Take 50 mg by mouth at bedtime.    [provider]  vitamin C (ASCORBIC ACID) 500 MG tablet Take 500 mg by mouth 2 (two) times daily.     [provider]      VITAL SIGNS:  Blood pressure (!) 119/56, pulse 70, temperature (!) 97.5 F (36.4 C), temperature source Oral, resp. rate (!) 30, SpO2 100 %.  PHYSICAL EXAMINATION:  Physical Exam  GENERAL:  75 y.o.-year-old Caucasian male patient lying in the bed with moderate respiratory distress on BiPAP. EYES: Pupils equal, round, reactive to light and accommodation. No scleral icterus. Extraocular muscles intact.  HEENT: Head atraumatic, normocephalic. Oropharynx and nasopharynx clear.  NECK:  Supple, no jugular venous distention. No thyroid enlargement, no tenderness.  LUNGS: Diminished bibasal breath sounds with bibasal rales.  He has diffuse coarse breath sounds with expiratory wheezes. CARDIOVASCULAR: Regular rate and rhythm, S1, S2 normal. No murmurs, rubs, or gallops.  ABDOMEN: Soft, nondistended, nontender. Bowel sounds present. No organomegaly or mass.  EXTREMITIES: Trace bilateral lower extremity pitting edema, with no cyanosis, or clubbing.  NEUROLOGIC: Cranial nerves II through XII are intact. Muscle strength 5/5 in all extremities. Sensation intact. Gait not checked.  PSYCHIATRIC: The patient is alert and oriented x 3.  Normal affect and good eye contact. SKIN: He has dressed bilateral leg skin tears.  LABORATORY PANEL:   CBC Recent Labs  Lab 04/21/21 1836  WBC 15.5*   HGB 11.1*  HCT 37.6*  PLT 379   ------------------------------------------------------------------------------------------------------------------  Chemistries  Recent Labs  Lab 04/21/21 1836  NA 135  K 4.3  CL 99  CO2 29  GLUCOSE 257*  BUN 23  CREATININE 1.61*  CALCIUM 8.5*   ------------------------------------------------------------------------------------------------------------------  Cardiac Enzymes No results for input(s): TROPONINI in the last 168 hours. ------------------------------------------------------------------------------------------------------------------  RADIOLOGY:  DG Chest Port 1 View  Result Date: 04/21/2021 CLINICAL DATA:  Respiratory distress EXAM: PORTABLE CHEST 1 VIEW COMPARISON:  04/07/2021 FINDINGS: Post CABG changes. Stable cardiomegaly. Pulmonary vascular congestion with diffuse alveolar and interstitial opacities bilaterally. Probable small bilateral pleural effusions. No pneumothorax. IMPRESSION: Findings suggestive of CHF with pulmonary edema. A superimposed infectious process would be difficult to exclude. Electronically Signed   By: Davina Poke D.O.   On: 04/21/2021 19:07      IMPRESSION AND PLAN:  Active Problems:   * No active hospital problems. *  1.  Acute on chronic systolic CHF. - The patient will be admitted to a telemetry. - We will continue diuresis with IV Lasix. - We will follow serial troponin I's. - Cardiology consult will be obtained. - I notified Dr. Clayborn Bigness about the patient. - The patient had a 2D echo on 12/26/2019 revealing an EF of 35 to 40% with grade 1 diastolic dysfunction at that time but repeat echo a year later on 11/15/2020 revealed an EF of 50 to 55% with normal diastolic function.  2.  Acute hypoxic respiratory failure secondary to #1 as well as possibly COPD acute exacerbation that could be secondary to lower respiratory infection/pneumonia which cannot  be ruled out by his chest x-ray. - We will  continue the patient on BiPAP tonight and taper off as tolerated. - O2 protocol will be followed. - The patient will be placed on Solu-Medrol as well as scheduled and as needed DuoNebs. - We will place him on IV antibiotic therapy with Rocephin and Zithromax and follow blood and sputum culture.  3.  Chronic atrial fibrillation with controlled ventricular response. - We will continue Eliquis.  4.  Essential hypertension. - We will continue hydralazine.  5.  Type 2 diabetes mellitus with peripheral neuropathy. - The patient will be placed on supplemental coverage with NovoLog and will continue basal coverage. - We will continue Neurontin.  6.  Dyslipidemia. - We will continue statin therapy.  7.  Stage IIIb chronic kidney disease. - We will monitor renal functions with diuresis.  Creatinine is better than 2 weeks ago.   DVT prophylaxis: We will continue Eliquis. Code Status: The patient is DNR/DNI.  He has an out of facility DNR form. Family Communication:  The plan of care was discussed in details with the patient (and family). I answered all questions. The patient agreed to proceed with the above mentioned plan. Further management will depend upon hospital course. Disposition Plan: Back to previous home environment Consults called: Cardiology consult.   All the records are reviewed and case discussed with ED provider.  Status is: Inpatient  Remains inpatient appropriate because:Ongoing diagnostic testing needed not appropriate for outpatient work up, Unsafe d/c plan, IV treatments appropriate due to intensity of illness or inability to take PO, and Inpatient level of care appropriate due to severity of illness  Dispo: The patient is from: SNF              Anticipated d/c is to: SNF              Patient currently is not medically stable to d/c.   Difficult to place patient No   TOTAL TIME TAKING CARE OF THIS PATIENT: 55 minutes.    Christel Mormon M.D on 04/21/2021 at 8:48  PM  [facility]   From 7 PM-7 AM, contact night-coverage [email or web address]  CC: Primary care physician; [name], MD

## 2021-04-21 NOTE — ED Notes (Signed)
Covid swab sent to lab.

## 2021-04-21 NOTE — ED Notes (Signed)
Pt pants removed and placed into pt belonging bag. Brief saturated with urine. breif removed. Pt cleansed with bath wipes. Clean brief and external urinary catheter placed. Pt repositioned in bed by this RN and [facility] NT. Pt covered with blankets.

## 2021-04-21 NOTE — ED Triage Notes (Addendum)
Pt to ED via [facility] from [facility]. Per EMS pt was at baseline one hour PTA. Staff stating went to check on pt and found pt in respiratory distress, increased WOB, RR 35-44s. FDA stating RA sats 70s. Pt on CPAP with improvement to 88%. Pt showing 6-7 runs of VTACH. BP 216/130. Pt hx COPD and afib.   1 duo ned, 2.5 albuterol, 125mg  solumedrol, 150 amiodarone given in route.

## 2021-04-21 NOTE — ED Notes (Signed)
RT called to place neb tx attachment onto BiPAP

## 2021-04-22 ENCOUNTER — Other Ambulatory Visit: Payer: Self-pay

## 2021-04-22 LAB — CBC
HCT: 32 % — ABNORMAL LOW (ref 39.0–52.0)
Hemoglobin: 9.8 g/dL — ABNORMAL LOW (ref 13.0–17.0)
MCH: 26.2 pg (ref 26.0–34.0)
MCHC: 30.6 g/dL (ref 30.0–36.0)
MCV: 85.6 fL (ref 80.0–100.0)
Platelets: 267 10*3/uL (ref 150–400)
RBC: 3.74 MIL/uL — ABNORMAL LOW (ref 4.22–5.81)
RDW: 22.6 % — ABNORMAL HIGH (ref 11.5–15.5)
WBC: 5.7 10*3/uL (ref 4.0–10.5)
nRBC: 0 % (ref 0.0–0.2)

## 2021-04-22 LAB — BASIC METABOLIC PANEL
Anion gap: 8 (ref 5–15)
BUN: 30 mg/dL — ABNORMAL HIGH (ref 8–23)
CO2: 27 mmol/L (ref 22–32)
Calcium: 8.2 mg/dL — ABNORMAL LOW (ref 8.9–10.3)
Chloride: 101 mmol/L (ref 98–111)
Creatinine, Ser: 1.8 mg/dL — ABNORMAL HIGH (ref 0.61–1.24)
GFR, Estimated: 39 mL/min — ABNORMAL LOW (ref >60)
Glucose, Bld: 248 mg/dL — ABNORMAL HIGH (ref 70–99)
Potassium: 4.6 mmol/L (ref 3.5–5.1)
Sodium: 136 mmol/L (ref 135–145)

## 2021-04-22 LAB — GLUCOSE, CAPILLARY
Glucose-Capillary: 234 mg/dL — ABNORMAL HIGH (ref 70–99)
Glucose-Capillary: 299 mg/dL — ABNORMAL HIGH (ref 70–99)

## 2021-04-22 LAB — CBG MONITORING, ED
Glucose-Capillary: 222 mg/dL — ABNORMAL HIGH (ref 70–99)
Glucose-Capillary: 321 mg/dL — ABNORMAL HIGH (ref 70–99)

## 2021-04-22 LAB — STREP PNEUMONIAE URINARY ANTIGEN: Strep Pneumo Urinary Antigen: NEGATIVE

## 2021-04-22 MED ORDER — IPRATROPIUM BROMIDE 0.02 % IN SOLN
0.5000 mg | Freq: Four times a day (QID) | RESPIRATORY_TRACT | Status: DC
  Administered 2021-04-22 – 2021-04-24 (×7): 0.5 mg via RESPIRATORY_TRACT
  Filled 2021-04-22 (×7): qty 2.5

## 2021-04-22 MED ORDER — ALPRAZOLAM 0.5 MG PO TABS: 0.5000 mg | ORAL_TABLET | Freq: Three times a day (TID) | ORAL | Status: DC | PRN

## 2021-04-22 MED ORDER — ALBUTEROL SULFATE (2.5 MG/3ML) 0.083% IN NEBU: 2.5000 mg | INHALATION_SOLUTION | Freq: Four times a day (QID) | RESPIRATORY_TRACT | Status: DC | PRN

## 2021-04-22 MED ORDER — GABAPENTIN 100 MG PO CAPS
100.0000 mg | ORAL_CAPSULE | Freq: Three times a day (TID) | ORAL | Status: DC
  Administered 2021-04-22 – 2021-04-25 (×10): 100 mg via ORAL
  Filled 2021-04-22 (×10): qty 1

## 2021-04-22 MED ORDER — SENNA 8.6 MG PO TABS: 1.0000 | ORAL_TABLET | Freq: Every day | ORAL | Status: DC | PRN

## 2021-04-22 MED ORDER — POLYETHYLENE GLYCOL 3350 17 G PO PACK
17.0000 g | PACK | Freq: Every day | ORAL | Status: DC
  Administered 2021-04-23 – 2021-04-24 (×2): 17 g via ORAL
  Filled 2021-04-22 (×3): qty 1

## 2021-04-22 MED ORDER — LEVALBUTEROL HCL 0.63 MG/3ML IN NEBU
0.6300 mg | INHALATION_SOLUTION | Freq: Four times a day (QID) | RESPIRATORY_TRACT | Status: DC
  Administered 2021-04-22 – 2021-04-24 (×7): 0.63 mg via RESPIRATORY_TRACT
  Filled 2021-04-22 (×7): qty 3

## 2021-04-22 MED ORDER — HYDROCORTISONE (PERIANAL) 2.5 % EX CREA
TOPICAL_CREAM | Freq: Two times a day (BID) | CUTANEOUS | Status: DC
  Filled 2021-04-22: qty 28.35

## 2021-04-22 MED ORDER — POLYETHYLENE GLYCOL 3350 17 GM/SCOOP PO POWD
17.0000 g | Freq: Every day | ORAL | Status: DC
  Filled 2021-04-22: qty 255

## 2021-04-22 NOTE — ED Notes (Signed)
Requested glucerna shake from dietary

## 2021-04-22 NOTE — ED Notes (Signed)
Patient set up with breakfast tray 

## 2021-04-22 NOTE — ED Notes (Signed)
MD Berle Mull messaged per patient request. Patient wants to speak with MD about hand tremors.

## 2021-04-22 NOTE — Progress Notes (Signed)
[facility] Progress Note  Patient: [name]    JTT:[id number]  DOA: 04/21/2021     Date of Service: the patient was seen and examined on 04/22/2021  Brief hospital course: Past medical history of CKD 3B HFpEF, type II DM, CAD SP CABG, PAF, COPD, HLD, CVA, PVD.  Presents with complaints of shortness of breath with acute on chronic diastolic CHF requiring BiPAP. Currently respiratory status is improved but still not back to baseline. Cardiology consulted Currently plan is continue aggressive diuresis.  Assessment and Plan: 1.  Acute hypoxic respiratory failure, present on admission Acute on chronic combined systolic and diastolic CHF Chronic atrial fibrillation History of COPD Hypertensive urgency Presents from SNF.  SNF staff found the patient with respiratory rate in 30s and 40s, room air sats of 70s with improvement to 88% on CPAP. On initial evaluation blood pressure was also 200/130 Chest x-ray shows significant vascular congestion. Treated aggressively with BiPAP, IV diuresis, IV steroids, IV antibiotics. Currently improving.  Back on his 2 L of oxygen. We will continue with aggressive IV diuresis as still appears to be in respiratory distress with bilateral crackles. Blood pressure improving as well. Added Imdur.  Due to bradycardia unable to add beta-blocker.  Due to CKD  unable to add ACE inhibitor or ARB. Echocardiogram on January 2022 shows 50 to 55% EF improved from 35 to 40% in February 2021. Does not appear to have any COPD exacerbation.  We will continue with nebulization but will not continue antibiotics or steroids. Stop BiPAP.  Continue oxygen as needed.  2.  Chronic A. fib Rate controlled without medication.  Currently actually has bradycardia. Continue Eliquis.  3.  Type II DM, uncontrolled with hyperglycemia with peripheral neuropathy without long-term insulin use Continue Neurontin. Currently on sliding scale insulin. Holding home regimen.  4.   HLD Continue statin  5.  CKD 3B Baseline serum creatinine around 2.0. Currently serum creatinine 1.6-1.8.  Monitor with diuresis.  6.  History of CAD, has history of COPD, elevated troponin Demand ischemia. Does not appear to have ACS. Monitor.  Cardiology consulted.  7.  Goals of care conversation. Palliative care consulted.  DNR.  8.  Anxiety. Mood disorder. Continue home regimen.  9.  PVD. Checking ABI.   Diet: Cardiac diet DVT Prophylaxis:   apixaban (ELIQUIS) tablet 2.5 mg Start: 04/21/21 2200 apixaban (ELIQUIS) tablet 2.5 mg    Advance goals of care discussion: DNR  Family Communication: no family was present at bedside, at the time of interview.   Disposition:  Status is: Inpatient  Remains inpatient appropriate because:IV treatments appropriate due to intensity of illness or inability to take PO  Dispo: The patient is from: SNF              Anticipated d/c is to: SNF              Patient currently is not medically stable to d/c.   Difficult to place patient No  Subjective: Continues to have shortness of breath continues to have cough.  No nausea no vomiting.  Reports anxiety.  Physical Exam:  General: Appear in mild distress, no Rash; Oral Mucosa Clear, moist. no Abnormal Neck Mass Or lumps, Conjunctiva normal  Cardiovascular: S1 and S2 Present, no Murmur, Respiratory: increased respiratory effort, Bilateral Air entry present and bilateral  Crackles, no wheezes Abdomen: Bowel Sound present, Soft and no tenderness Extremities: trace Pedal edema Neurology: alert and oriented to time, place, and person affect appropriate. no new focal deficit  Gait not checked due to patient safety concerns  Vitals:   04/22/21 1330 04/22/21 1400 04/22/21 1528 04/22/21 1605  BP: (!) 157/66 (!) 149/55 (!) 145/62   Pulse: 76 65 66   Resp: (!) 26 (!) 31 18   Temp:   97.8 F (36.6 C)   TempSrc:   Oral   SpO2: 95% 99% 98% 96%    Intake/Output Summary (Last 24 hours) at  04/22/2021 1622 Last data filed at 04/22/2021 1500 Gross per 24 hour  Intake 240 ml  Output 800 ml  Net -560 ml   There were no vitals filed for this visit.  Data Reviewed: I have personally reviewed and interpreted daily labs, tele strips, imaging. I reviewed all nursing notes, pharmacy notes, vitals, pertinent old records I have discussed plan of care as described above with RN and patient/family.  CBC: Recent Labs  Lab 04/21/21 1836 04/22/21 0354  WBC 15.5* 5.7  NEUTROABS 9.6*  --   HGB 11.1* 9.8*  HCT 37.6* 32.0*  MCV 88.3 85.6  PLT 379 174   Basic Metabolic Panel: Recent Labs  Lab 04/21/21 1836 04/22/21 0354  NA 135 136  K 4.3 4.6  CL 99 101  CO2 29 27  GLUCOSE 257* 248*  BUN 23 30*  CREATININE 1.61* 1.80*  CALCIUM 8.5* 8.2*    Studies: DG Chest Port 1 View  Result Date: 04/21/2021 CLINICAL DATA:  Respiratory distress EXAM: PORTABLE CHEST 1 VIEW COMPARISON:  04/07/2021 FINDINGS: Post CABG changes. Stable cardiomegaly. Pulmonary vascular congestion with diffuse alveolar and interstitial opacities bilaterally. Probable small bilateral pleural effusions. No pneumothorax. IMPRESSION: Findings suggestive of CHF with pulmonary edema. A superimposed infectious process would be difficult to exclude. Electronically Signed   By: Davina Poke D.O.   On: 04/21/2021 19:07    Scheduled Meds:  apixaban  2.5 mg Oral Q12H   busPIRone  5 mg Oral BID   clopidogrel  75 mg Oral Daily   feeding supplement (GLUCERNA SHAKE)  237 mL Oral TID BM   ferrous sulfate  325 mg Oral BID WC   furosemide  40 mg Intravenous Q12H   gabapentin  100 mg Oral TID   guaiFENesin  600 mg Oral BID   hydrocortisone   Rectal BID   insulin aspart  0-15 Units Subcutaneous TID AC & HS   insulin glargine  15 Units Subcutaneous Q2200   ipratropium  0.5 mg Nebulization QID   levalbuterol  0.63 mg Nebulization QID   multivitamin-lutein  1 capsule Oral Daily   nicotine  21 mg Transdermal Daily    pantoprazole  40 mg Oral Daily   polyethylene glycol powder  17 g Oral Daily   simvastatin  20 mg Oral QPM   traZODone  50 mg Oral QHS   Continuous Infusions: PRN Meds: acetaminophen **OR** acetaminophen, ALPRAZolam, bisacodyl, lactase, ondansetron **OR** ondansetron (ZOFRAN) IV, polyethylene glycol  Time spent: 35 minutes  Author: Berle Mull, MD [facility] 04/22/2021 4:22 PM  To reach On-call, see care teams to locate the attending and reach out via [email or web address]. Between 7PM-7AM, please contact night-coverage If you still have difficulty reaching the attending provider, please page the [facility] (Director on Call) for [facility] on amion for assistance.

## 2021-04-22 NOTE — ED Notes (Signed)
Patient placed on 6L O2. Maintaining oxygen sat of 100%

## 2021-04-22 NOTE — Progress Notes (Signed)
PHARMACIST - PHYSICIAN ORDER COMMUNICATION  CONCERNING: P&T Medication Policy on Herbal Medications  DESCRIPTION:  This patient's order for:  Cinnamon 1,000 mg  & Cranberry Extract TABS 500 mg has been noted.  This product(s) is classified as an "herbal" or natural product. Due to a lack of definitive safety studies or FDA approval, nonstandard manufacturing practices, plus the potential risk of unknown drug-drug interactions while on inpatient medications, the Pharmacy and Therapeutics Committee does not permit the use of "herbal" or natural products of this type within [facility].   ACTION TAKEN: The pharmacy department is unable to verify this order at this time.  Please reevaluate patient's clinical condition at discharge and address if the herbal or natural product(s) should be resumed at that time.   Renda Rolls, PharmD, MBA 04/22/2021 1:48 AM

## 2021-04-22 NOTE — ED Notes (Signed)
OT at bedside. 

## 2021-04-22 NOTE — Consult Note (Signed)
[facility] Consultation Note  Patient ID: [name], MRN: [id number], DOB/AGE: 1946/10/19 75 y.o. Admit date: 04/21/2021   Date of Consult: 04/22/2021 Primary Physician: [name], MD Primary Cardiologist: Ubaldo Glassing  Chief Complaint:  Chief Complaint  Patient presents with   Respiratory Distress   Reason for Consult:  Heart failure  HPI: 75 y.o. male with known coronary disease status post coronary bypass graft hypertension hyperlipidemia diabetes and chronic obstructive pulmonary disease with paroxysmal nonvalvular atrial fibrillation and peripheral vascular disease with previous cerebrovascular accident who has had progressive issues significant shortness of breath PND and orthopnea consistent with heart failure.  He had a chest x-ray showing pulmonary edema and minimal pleural effusion.  BNP was 1488 and troponin was 63.  EKG shows Atrial flutter with variable heart rate and left bundle branch block.  The patient did receive intravenous Lasix with some improvements of his symptoms but still short of breath needing oxygen supplementation.  There is no evidence of acute coronary syndrome or chest pain at this time. Past Medical History:  Diagnosis Date   Arrhythmia    atrial fibrillation   Atrial fibrillation ([place])    Basal cell carcinoma 05/2019   right nasal ala, Tx: EDC   Basal cell carcinoma 04/12/2019   Left posterior ear. Nodular pattern, excoriated.    CHF (congestive heart failure) (HCC)    COPD (chronic obstructive pulmonary disease) (HCC)    Coronary artery disease    Diabetes mellitus without complication (HCC)    GERD (gastroesophageal reflux disease)    HLD (hyperlipidemia)    Hypertension    Peripheral vascular disease (HCC)    Pneumonia    Prostate cancer ([place])    Squamous cell carcinoma of skin 08/10/2018   Left upper arm above elbow. [facility].   Squamous cell carcinoma of skin 09/07/2018   Right forearm, below elbow. [facility]. [facility] 01/10/2019.    Stroke [facility])       Surgical History:  Past Surgical History:  Procedure Laterality Date   COLONOSCOPY     CORONARY ARTERY BYPASS GRAFT  2006   EYE SURGERY     GOLD SEED IMPLANT N/A 12/30/2016   Procedure: GOLD SEED IMPLANT  x3;  Surgeon: Hollice Espy, MD;  Location: [facility] ORS;  Service: Urology;  Laterality: N/A;   HEMORRHOID SURGERY N/A 10/02/2016   Procedure: PROCTOSCOPY AND CONTROL OF RECTAL BLEEDING;  Surgeon: Excell Seltzer, MD;  Location: [facility] ORS;  Service: General;  Laterality: N/A;   PROSTATE BIOPSY     TONSILLECTOMY       Home Meds: Prior to Admission medications   Medication Sig Start Date End Date Taking? Authorizing Provider  apixaban (ELIQUIS) 2.5 MG TABS tablet Take 2.5 mg by mouth every 12 (twelve) hours.   Yes [provider]  B Complex CAPS Take 1 capsule by mouth daily. 03/04/21  Yes [provider]  bifidobacterium infantis (ALIGN) capsule Take 1 capsule by mouth every morning.   Yes [provider]  busPIRone (BUSPAR) 5 MG tablet Take 1 tablet by mouth 2 (two) times daily. 04/09/21  Yes [provider]  cholecalciferol (VITAMIN D) 1000 units tablet Take 1,000 Units by mouth daily.   Yes [provider]  Cinnamon 500 MG capsule Take 1,000 mg by mouth 2 (two) times daily.   Yes [provider]  clopidogrel (PLAVIX) 75 MG tablet Take 1 tablet (75 mg total) by mouth daily. 04/08/21  Yes Lorella Nimrod, MD  COMBIVENT RESPIMAT 20-100 MCG/ACT AERS  respimat Inhale 2 puffs into the lungs 4 (four) times daily as needed. 09/18/20  Yes [provider]  CRANBERRY CONCENTRATE 500 MG CAPS Take 500 mg by mouth daily. 04/08/21  Yes [provider]  ferrous sulfate 325 (65 FE) MG tablet Take 1 tablet (325 mg total) by mouth 2 (two) times daily with a meal. 04/08/21  Yes Lorella Nimrod, MD  gabapentin (NEURONTIN) 100 MG capsule Take 100 mg by mouth 2 (two) times daily.   Yes [provider]  hydrocortisone  (ANUSOL-HC) 25 MG suppository Place 25 mg rectally in the morning, at noon, and at bedtime. Pt may administer   Yes [provider]  hydrocortisone 2.5 % cream Place 1 application rectally as needed.   Yes [provider]  insulin aspart (NOVOLOG) 100 UNIT/ML injection 0-9 Units, Subcutaneous, 3 times daily with meals CBG < 70: Implement Hypoglycemia protocol/measures CBG 70 - 120: 0 units CBG 121 - 150: 1 unit CBG 151 - 200: 2 units CBG 201 - 250: 3 units CBG 251 - 300: 5 units CBG 301 - 350: 7 units CBG 351 - 400: 9 units CBG > 400: call MD 10/15/19  Yes Ghimire, Henreitta Leber, MD  Lactobacillus (ACIDOPHILUS) TABS Take 1 tablet by mouth daily. 04/08/21  Yes [provider]  LANTUS SOLOSTAR 100 UNIT/ML Solostar Pen Inject 15 Units into the skin daily at 10 pm. 03/02/21  Yes Wouk, Ailene Rud, MD  [facility] 17 GM/SCOOP powder Take 17 g by mouth daily. 04/19/21  Yes [provider]  multivitamin-lutein (OCUVITE-LUTEIN) CAPS capsule Take 1 capsule by mouth daily.   Yes [provider]  omeprazole (PRILOSEC) 20 MG capsule Take 20 mg by mouth daily.   Yes [provider]  simvastatin (ZOCOR) 20 MG tablet Take 20 mg by mouth in the morning.   Yes [provider]  tiotropium (SPIRIVA HANDIHALER) 18 MCG inhalation capsule Place 1 capsule (18 mcg total) into inhaler and inhale daily. 02/05/21 05/06/21 Yes Enzo Bi, MD  torsemide [facility]) 20 MG tablet Take 1 tablet (20 mg total) by mouth every Tuesday, Thursday, Saturday, and Sunday. 04/09/21  Yes Lorella Nimrod, MD  traZODone (DESYREL) 50 MG tablet Take 50 mg by mouth at bedtime.   Yes [provider]  vitamin C (ASCORBIC ACID) 500 MG tablet Take 500 mg by mouth 2 (two) times daily.    Yes [provider]  acetaminophen (TYLENOL) 325 MG tablet Take 2 tablets (650 mg total) by mouth every 6 (six) hours as needed for mild pain (or Fever >/= 101). 01/20/16   Gouru, Illene Silver, MD  albuterol  (PROVENTIL HFA;VENTOLIN HFA) 108 (90 Base) MCG/ACT inhaler Inhale 2 puffs into the lungs every 6 (six) hours as needed for wheezing or shortness of breath.    [provider]  ALPRAZolam Duanne Moron) 0.5 MG tablet Take 0.5 mg by mouth 3 (three) times daily as needed. 04/11/21   [provider]  B Complex-C (B-COMPLEX WITH VITAMIN C) tablet Take 1 tablet by mouth every morning. 04/08/21   [provider]  bisacodyl (DULCOLAX) 10 MG suppository Place 10 mg rectally daily as needed for moderate constipation.    [provider]  ELIQUIS 5 MG TABS tablet Take 5 mg by mouth 2 (two) times daily. 04/08/21   [provider]  feeding supplement, GLUCERNA SHAKE, (GLUCERNA SHAKE) LIQD Take 237 mLs by mouth 3 (three) times daily between meals.    [provider]  glucose 4 GM chewable tablet Chew  1 tablet by mouth as needed for low blood sugar.    [provider]  lactase (LACTAID) 3000 units tablet Take 3,000 Units by mouth 3 (three) times daily between meals as needed.    [provider]  nicotine (NICODERM CQ - DOSED IN MG/24 HOURS) 21 mg/24hr patch Place 21 mg onto the skin daily.    [provider]  senna (SENOKOT) 8.6 MG TABS tablet Take 1 tablet by mouth daily.    [provider]    Inpatient Medications:   apixaban  2.5 mg Oral Q12H   busPIRone  5 mg Oral BID   clopidogrel  75 mg Oral Daily   feeding supplement (GLUCERNA SHAKE)  237 mL Oral TID BM   ferrous sulfate  325 mg Oral BID WC   furosemide  40 mg Intravenous Q12H   gabapentin  100 mg Oral TID   guaiFENesin  600 mg Oral BID   hydrocortisone   Rectal BID   insulin aspart  0-15 Units Subcutaneous TID AC & HS   insulin glargine  15 Units Subcutaneous Q2200   ipratropium  0.5 mg Nebulization QID   levalbuterol  0.63 mg Nebulization QID   multivitamin-lutein  1 capsule Oral Daily   nicotine  21 mg Transdermal Daily   pantoprazole  40 mg Oral Daily   polyethylene  glycol powder  17 g Oral Daily   simvastatin  20 mg Oral QPM   traZODone  50 mg Oral QHS     Allergies:  Allergies  Allergen Reactions   Lactose Intolerance (Gi)     Social History   Socioeconomic History   Marital status: Married    Spouse name: Not on file   Number of children: Not on file   Years of education: Not on file   Highest education level: Not on file  Occupational History   Not on file  Tobacco Use   Smoking status: Former    Packs/day: 1.00    Years: 54.00    Pack years: 54.00    Types: Cigarettes   Smokeless tobacco: Never  Vaping Use   Vaping Use: Never used  Substance and Sexual Activity   Alcohol use: No    Alcohol/week: 0.0 standard drinks   Drug use: No   Sexual activity: Yes    Birth control/protection: None    Comment: Married  Other Topics Concern   Not on file  Social History Narrative   Not on file   Social Determinants of Health   Financial Resource Strain: Not on file  Food Insecurity: Not on file  Transportation Needs: Not on file  Physical Activity: Not on file  Stress: Not on file  Social Connections: Not on file  Intimate Partner Violence: Not on file     Family History  Problem Relation Age of Onset   Lung cancer Mother    Heart attack Father    Hypertension Other    Cancer Maternal Grandfather        prostate   Cancer Paternal Grandmother        breast     Review of Systems Positive for shortness of breath PND orthopnea Negative for: General:  chills, fever, night sweats or weight changes.  Cardiovascular: Positive for PND orthopnea negative for syncope dizziness  Dermatological skin lesions rashes Respiratory: Cough congestion Urologic: Frequent urination urination at night and hematuria Abdominal: negative for nausea, vomiting, diarrhea, bright red blood per rectum, melena, or hematemesis Neurologic: negative for visual changes, and/or  hearing changes  All other systems reviewed and are otherwise negative  except as noted above.  Labs: No results for input(s): CKTOTAL, CKMB, TROPONINI in the last 72 hours. Lab Results  Component Value Date   WBC 5.7 04/22/2021   HGB 9.8 (L) 04/22/2021   HCT 32.0 (L) 04/22/2021   MCV 85.6 04/22/2021   PLT 267 04/22/2021    Recent Labs  Lab 04/22/21 0354  NA 136  K 4.6  CL 101  CO2 27  BUN 30*  CREATININE 1.80*  CALCIUM 8.2*  GLUCOSE 248*   No results found for: CHOL, HDL, LDLCALC, TRIG Lab Results  Component Value Date   DDIMER 1.05 (H) 04/21/2021    Radiology/Studies:  DG Chest 1 View  Result Date: 04/02/2021 CLINICAL DATA:  Confusion, weakness and elevated white blood cell count. EXAM: CHEST  1 VIEW COMPARISON:  Single-view of the chest 02/21/2021 and 03/01/2021. CT chest 02/02/2021. FINDINGS: The lungs are emphysematous. There is left basilar atelectasis. The right lung is clear. Cardiomegaly. The patient is status post CABG. Aortic atherosclerosis. No pneumothorax or pleural fluid. IMPRESSION: No acute disease. Left basilar atelectasis. Cardiomegaly. Aortic Atherosclerosis (ICD10-I70.0) and Emphysema (ICD10-J43.9). Electronically Signed   By: Inge Rise M.D.   On: 04/02/2021 17:04   CT Head Wo Contrast  Result Date: 04/02/2021 CLINICAL DATA:  Generalized weakness and altered mental status EXAM: CT HEAD WITHOUT CONTRAST TECHNIQUE: Contiguous axial images were obtained from the base of the skull through the vertex without intravenous contrast. COMPARISON:  02/19/2021 FINDINGS: Brain: No evidence of acute infarction, hemorrhage, hydrocephalus, extra-axial collection or mass lesion/mass effect. Mild atrophic and chronic white matter ischemic changes are noted. Vascular: No hyperdense vessel or unexpected calcification. Skull: Normal. Negative for fracture or focal lesion. Sinuses/Orbits: No acute finding. Other: None. IMPRESSION: Chronic atrophic and ischemic changes without acute abnormality. Electronically Signed   By: Inez Catalina M.D.    On: 04/02/2021 16:46   DG Chest Port 1 View  Result Date: 04/21/2021 CLINICAL DATA:  Respiratory distress EXAM: PORTABLE CHEST 1 VIEW COMPARISON:  04/07/2021 FINDINGS: Post CABG changes. Stable cardiomegaly. Pulmonary vascular congestion with diffuse alveolar and interstitial opacities bilaterally. Probable small bilateral pleural effusions. No pneumothorax. IMPRESSION: Findings suggestive of CHF with pulmonary edema. A superimposed infectious process would be difficult to exclude. Electronically Signed   By: Davina Poke D.O.   On: 04/21/2021 19:07   DG Chest Port 1 View  Result Date: 04/07/2021 CLINICAL DATA:  Shortness of breath EXAM: PORTABLE CHEST 1 VIEW COMPARISON:  Apr 02, 2021 FINDINGS: There is again cardiomegaly. There is pulmonary venous hypertension. There is extensive interstitial thickening and airspace opacity, likely representing interstitial and alveolar pulmonary edema. There is a left pleural effusion. Patient is status post coronary artery bypass grafting. There is aortic atherosclerosis. No bone lesions. IMPRESSION: Cardiomegaly with pulmonary vascular congestion. Suspected interstitial alveolar edema with left pleural effusion. The overall appearance is consistent with congestive heart failure. It should be noted that patchy superimposed pneumonia cannot be excluded in this circumstance. Status post coronary artery bypass grafting. Aortic Atherosclerosis (ICD10-I70.0). Electronically Signed   By: Lowella Grip III M.D.   On: 04/07/2021 10:32    EKG: Atrial flutter with variable heart rate and left bundle branch block  Weights: There were no vitals filed for this visit.   Physical Exam: Blood pressure (!) 145/63, pulse 70, temperature (!) 97.5 F (36.4 C), temperature source Oral, resp. rate (!) 21, SpO2 98 %. There is no height or weight  on file to calculate BMI. General: Well developed, well nourished, in no acute distress. Head eyes ears nose throat: Normocephalic,  atraumatic, sclera non-icteric, no xanthomas, nares are without discharge. No apparent thyromegaly and/or mass  Lungs: Normal respiratory effort.  Diffuse wheezes, basilar rales, n some rhonchi.  Heart: Giller with normal S1 S2. no murmur gallop, no rub, PMI is normal size and placement, carotid upstroke normal without bruit, jugular venous pressure is normal Abdomen: Soft, non-tender, non-distended with normoactive bowel sounds. No hepatomegaly. No rebound/guarding. No obvious abdominal masses. Abdominal aorta is normal size without bruit Extremities: No edema. no cyanosis, no clubbing, no ulcers  Peripheral : 2+ bilateral upper extremity pulses, 2+ bilateral femoral pulses, 2+ bilateral dorsal pedal pulse Neuro: Alert and oriented. No facial asymmetry. No focal deficit. Moves all extremities spontaneously. Musculoskeletal: Normal muscle tone without kyphosis Psych:  Responds to questions appropriately with a normal affect.    Assessment: 75 year old male with acute on chronic systolic dysfunction congestive heart failure with pulmonary edema diabetes hypertension hyperlipidemia peripheral vascular disease atrial flutter with abnormal EKG without evidence of angina and or acute coronary syndrome needing further treatment options  Plan: 1.  Continue intravenous Lasix for pulmonary edema and acute on chronic systolic dysfunction congestive heart failure 2.  Continue anticoagulation for further risk reduction and stroke with atrial fibrillation 3.  Okay with continuation of single antiplatelet therapy for peripheral vascular disease and coronary disease status post coronary bypass graft watching closely for anemia and/or bleeding 4.  Further consideration of low-dose beta-blocker for heart rate control of atrial flutter and further treatment of acute on chronic systolic dysfunction congestive heart failure 5.  No further cardiac diagnostics necessary at this time 6.  Further treatment options after  improvements of above  Signed, Corey Skains M.D. [facility] 04/22/2021, 1:36 PM

## 2021-04-22 NOTE — Progress Notes (Signed)
PT Cancellation Note  Patient Details Name: Tavonte Seybold MRN: [id number] DOB: June 16, 1946   Cancelled Treatment:    Reason Eval/Treat Not Completed:  (Consult received and chart reviewed. Patient currently eating lunch, requested therapist re-attempt later. Assisted with repositiioning in bed for upright sitting posture, improved midline; max assist to complete. Will continue efforts at later time/date)   Reyes Ivan. Owens Shark, PT, DPT, NCS 04/22/21, 1:47 PM [phone]

## 2021-04-22 NOTE — ED Notes (Signed)
Wife helping patient wife lunch tray at this time

## 2021-04-22 NOTE — ED Notes (Signed)
Patient cleaned and dry brief placed. Pulled up in bed

## 2021-04-22 NOTE — Plan of Care (Signed)
  Problem: Clinical Measurements: Goal: Will remain free from infection Outcome: Progressing   Problem: Activity: Goal: Risk for activity intolerance will decrease Outcome: Progressing   

## 2021-04-22 NOTE — Evaluation (Signed)
Occupational Therapy Evaluation Patient Details Name: [name] MRN: [id number] DOB: 07/11/1946 Today's Date: 04/22/2021    History of Present Illness 75 y.o. Caucasian male with medical history significant for atrial fibrillation on Eliquis, COPD, CHF, type 2 diabetes mellitus, GERD, dyslipidemia, hypertension, CVA and peripheral vascular disease, who presented to the emergency room with acute onset over the last hour prior to arrival to the ER at his rehab facility.  EMS reported a run of ventricular tachycardia and significant hypoxia with increased work of breathing.   Clinical Impression   Patient presenting with decreased I in self care, balance, functional mobility/transfer, endurance, and safety awareness. Patient reports recently being at [facility] for therapy. Prior to that, pt living with wife and pt reports being mod I with use of SPC. However, pt does endorse several falls and wife assisting with self care tasks and ambulation as needed. Patient needing set up A to open containers and cut food with noted B tremors in UE at baseline per pt report. Pt on 3 L O2 via [place] and turned down to 2 Ls this session with O2 saturation remaining at 95% or better. Pt's RR does increase to 37 while in bed and needing cuing for pursed lip breathing exercises. Pt does report feeling SOB with eating and communication. He declined EOB tasks this session. Patient will benefit from acute OT to increase overall independence in the areas of ADLs, functional mobility, and safety awareness in order to safely discharge to next venue of care.     Follow Up Recommendations  SNF;Supervision/Assistance - 24 hour    Equipment Recommendations  None recommended by OT       Precautions / Restrictions Precautions Precautions: Fall      Mobility Bed Mobility Overal bed mobility: Needs Assistance Bed Mobility: Rolling Rolling: Mod assist;Min assist         General bed mobility comments: for repositioning     Transfers                 General transfer comment: pt refusal to attempt        ADL either performed or assessed with clinical judgement   ADL Overall ADL's : Needs assistance/impaired Eating/Feeding: Set up;Bed level   Grooming: Wash/dry hands;Wash/dry face;Oral care;Bed level;Set up                                 General ADL Comments: Pt declined OOB and EOB tasks this session.     Vision Baseline Vision/History: Wears glasses Wears Glasses: At all times Patient Visual Report: No change from baseline              Pertinent Vitals/Pain Pain Assessment: Faces Faces Pain Scale: Hurts a little bit Pain Location: generalized Pain Descriptors / Indicators: Sore Pain Intervention(s): Limited activity within patient's tolerance;Monitored during session;Repositioned     Hand Dominance Right   Extremity/Trunk Assessment Upper Extremity Assessment Upper Extremity Assessment: Generalized weakness   Lower Extremity Assessment Lower Extremity Assessment: Generalized weakness       Communication Communication Communication: HOH   Cognition Arousal/Alertness: Awake/alert Behavior During Therapy: Flat affect Overall Cognitive Status: Within Functional Limits for tasks assessed  Home Living Family/patient expects to be discharged to:: Skilled nursing facility Living Arrangements: Spouse/significant other Available Help at Discharge: Family;Available PRN/intermittently Type of Home: House Home Access: Stairs to enter [employer] of Steps: 3-4 Entrance Stairs-Rails: Right;Left Home Layout: One level     Bathroom Shower/Tub: Teacher, early years/pre: Standard     Home Equipment: Shower seat;Cane - single point;Wheelchair - Press photographer;Bedside commode;Walker - 4 wheels;Walker - 2 wheels          Prior Functioning/Environment Level of  Independence: Needs assistance  Gait / Transfers Assistance Needed: Pt reports being Mod I with use of SPC but that wife has been assisting him more recently. ADL's / Homemaking Assistance Needed: wife assists with meals, med management, lower body dressing, sponge bathing, and transportation   Comments: Confirms several falls and home O2 at baseline.        OT Problem List: Decreased strength;Impaired balance (sitting and/or standing);Decreased safety awareness;Cardiopulmonary status limiting activity;Decreased activity tolerance;Decreased knowledge of use of DME or AE      OT Treatment/Interventions: Self-care/ADL training;DME and/or AE instruction;Therapeutic activities;Balance training;Therapeutic exercise;Energy conservation;Patient/family education    OT Goals(Current goals can be found in the care plan section) Acute Rehab OT Goals Patient Stated Goal: to feel better and get stronger OT Goal Formulation: With patient Time For Goal Achievement: 05/06/21 Potential to Achieve Goals: Fair ADL Goals Pt Will Perform Grooming: with supervision Pt Will Perform Lower Body Dressing: with supervision Pt Will Transfer to Toilet: with supervision Pt Will Perform Toileting - Clothing Manipulation and hygiene: with supervision  OT Frequency: Min 2X/week   Barriers to D/C:    none known at this time          AM-PAC OT "6 Clicks" Daily Activity     Outcome Measure Help from another person eating meals?: A Little Help from another person taking care of personal grooming?: A Little Help from another person toileting, which includes using toliet, bedpan, or urinal?: A Lot Help from another person bathing (including washing, rinsing, drying)?: A Lot Help from another person to put on and taking off regular upper body clothing?: A Little Help from another person to put on and taking off regular lower body clothing?: A Lot 6 Click Score: 15   End of Session Equipment Utilized During  Treatment: Oxygen (2Ls) Nurse Communication: Mobility status;Other (comment) (O2 requiremement and RR)  Activity Tolerance: Patient limited by fatigue Patient left: in bed;with call bell/phone within reach  OT Visit Diagnosis: Unsteadiness on feet (R26.81);Repeated falls (R29.6);Muscle weakness (generalized) (M62.81)                Time: 9735-3299 OT Time Calculation (min): 21 min Charges:  OT General Charges $OT Visit: 1 Visit OT Treatments $Self Care/Home Management : 8-22 mins  Darleen Crocker, MS, OTR/L , CBIS ascom [phone]  04/22/21, 11:18 AM

## 2021-04-23 ENCOUNTER — Inpatient Hospital Stay
Admit: 2021-04-23 | Discharge: 2021-04-23 | Disposition: A | Discharge status: Home / Self Care | Payer: PPO | Attending: Internal Medicine | Admitting: Internal Medicine

## 2021-04-23 ENCOUNTER — Inpatient Hospital Stay: Payer: PPO

## 2021-04-23 DIAGNOSIS — Z515 Encounter for palliative care: Secondary | ICD-10-CM

## 2021-04-23 DIAGNOSIS — E43 Unspecified severe protein-calorie malnutrition: Secondary | ICD-10-CM | POA: Insufficient documentation

## 2021-04-23 DIAGNOSIS — Z7189 Other specified counseling: Secondary | ICD-10-CM

## 2021-04-23 DIAGNOSIS — I482 Chronic atrial fibrillation, unspecified: Secondary | ICD-10-CM

## 2021-04-23 DIAGNOSIS — J9621 Acute and chronic respiratory failure with hypoxia: Secondary | ICD-10-CM

## 2021-04-23 LAB — BASIC METABOLIC PANEL
Anion gap: 8 (ref 5–15)
Anion gap: 9 (ref 5–15)
BUN: 37 mg/dL — ABNORMAL HIGH (ref 8–23)
BUN: 39 mg/dL — ABNORMAL HIGH (ref 8–23)
CO2: 31 mmol/L (ref 22–32)
CO2: 31 mmol/L (ref 22–32)
Calcium: 8.9 mg/dL (ref 8.9–10.3)
Calcium: 8.7 mg/dL — ABNORMAL LOW (ref 8.9–10.3)
Chloride: 97 mmol/L — ABNORMAL LOW (ref 98–111)
Chloride: 95 mmol/L — ABNORMAL LOW (ref 98–111)
Creatinine, Ser: 1.52 mg/dL — ABNORMAL HIGH (ref 0.61–1.24)
Creatinine, Ser: 1.55 mg/dL — ABNORMAL HIGH (ref 0.61–1.24)
GFR, Estimated: 47 mL/min — ABNORMAL LOW (ref >60)
GFR, Estimated: 46 mL/min — ABNORMAL LOW (ref >60)
Glucose, Bld: 150 mg/dL — ABNORMAL HIGH (ref 70–99)
Glucose, Bld: 264 mg/dL — ABNORMAL HIGH (ref 70–99)
Potassium: 4.3 mmol/L (ref 3.5–5.1)
Potassium: 3.9 mmol/L (ref 3.5–5.1)
Sodium: 136 mmol/L (ref 135–145)
Sodium: 135 mmol/L (ref 135–145)

## 2021-04-23 LAB — MAGNESIUM
Magnesium: 2 mg/dL (ref 1.7–2.4)
Magnesium: 2.2 mg/dL (ref 1.7–2.4)
Magnesium: 1.9 mg/dL (ref 1.7–2.4)

## 2021-04-23 LAB — GLUCOSE, CAPILLARY
Glucose-Capillary: 146 mg/dL — ABNORMAL HIGH (ref 70–99)
Glucose-Capillary: 251 mg/dL — ABNORMAL HIGH (ref 70–99)
Glucose-Capillary: 282 mg/dL — ABNORMAL HIGH (ref 70–99)
Glucose-Capillary: 226 mg/dL — ABNORMAL HIGH (ref 70–99)
Glucose-Capillary: 179 mg/dL — ABNORMAL HIGH (ref 70–99)

## 2021-04-23 MED ORDER — ENSURE MAX PROTEIN PO LIQD
11.0000 [oz_av] | Freq: Two times a day (BID) | ORAL | Status: DC
  Administered 2021-04-24 – 2021-04-25 (×3): 11 [oz_av] via ORAL
  Filled 2021-04-23: qty 330

## 2021-04-23 MED ORDER — ASCORBIC ACID 500 MG PO TABS
250.0000 mg | ORAL_TABLET | Freq: Two times a day (BID) | ORAL | Status: DC
  Administered 2021-04-23 – 2021-04-25 (×4): 250 mg via ORAL
  Filled 2021-04-23 (×4): qty 1

## 2021-04-23 MED ORDER — METOPROLOL TARTRATE 25 MG PO TABS
12.5000 mg | ORAL_TABLET | Freq: Two times a day (BID) | ORAL | Status: DC
  Administered 2021-04-23 – 2021-04-24 (×2): 12.5 mg via ORAL
  Filled 2021-04-23 (×2): qty 1

## 2021-04-23 MED ORDER — HYDRALAZINE HCL 50 MG PO TABS
50.0000 mg | ORAL_TABLET | Freq: Three times a day (TID) | ORAL | Status: DC
  Administered 2021-04-23 – 2021-04-25 (×7): 50 mg via ORAL
  Filled 2021-04-23 (×7): qty 1

## 2021-04-23 MED ORDER — SODIUM CHLORIDE 0.9 % IV SOLN
INTRAVENOUS | Status: DC | PRN
  Administered 2021-04-23: 250 mL via INTRAVENOUS

## 2021-04-23 MED ORDER — PERFLUTREN LIPID MICROSPHERE
1.0000 mL | INTRAVENOUS | Status: AC | PRN
  Administered 2021-04-23: 2 mL via INTRAVENOUS
  Filled 2021-04-23: qty 10

## 2021-04-23 MED ORDER — MAGNESIUM SULFATE 2 GM/50ML IV SOLN
2.0000 g | Freq: Once | INTRAVENOUS | Status: AC
  Administered 2021-04-23: 2 g via INTRAVENOUS
  Filled 2021-04-23: qty 50

## 2021-04-23 MED ORDER — ISOSORBIDE MONONITRATE ER 30 MG PO TB24
30.0000 mg | ORAL_TABLET | Freq: Every day | ORAL | Status: DC
  Administered 2021-04-23 – 2021-04-25 (×3): 30 mg via ORAL
  Filled 2021-04-23 (×3): qty 1

## 2021-04-23 NOTE — Progress Notes (Addendum)
[facility] Progress Note  Patient: [name]    OXB:[id number]  DOA: 04/21/2021     Date of Service: the patient was seen and examined on 04/23/2021  Brief hospital course: Past medical history of CKD 3B HFpEF, type II DM, CAD SP CABG, PAF, COPD, HLD, CVA, PVD.  Presents with complaints of shortness of breath with acute on chronic diastolic CHF requiring BiPAP. Currently respiratory status is improved but still not back to baseline. Cardiology consulted Currently plan is continue aggressive diuresis.  Assessment and Plan: 1.  Acute hypoxic respiratory failure, present on admission Acute on chronic combined systolic and diastolic CHF Chronic atrial fibrillation Frequent NSVT's and PVCs History of COPD Hypertensive urgency Presents from SNF.  SNF staff found the patient with respiratory rate in 30s and 40s, room air sats of 70s with improvement to 88% on CPAP. On initial evaluation blood pressure was also 200/130 Chest x-ray shows significant vascular congestion. Treated aggressively with BiPAP, IV diuresis, IV steroids, IV antibiotics. Currently improving.  Back on his 3 L of oxygen. Continue IV Lasix. Continue Imdur. Patient does have bradycardia but also has significant PVCs and NSVT's. Discussed with cardiology okay to add low-dose Lopressor right now. EKG unremarkable. Repeat chest x-ray on 6/21 shows improvement in respiratory status. Prognosis remains guarded Due to CKD  unable to add ACE inhibitor or ARB. Echocardiogram on January 2022 shows 50 to 55% EF improved from 35 to 40% in February 2021. We will repeat another echocardiogram due to frequent PVCs and NSVT's. Does not appear to have any COPD exacerbation.  We will continue with nebulization but will not continue antibiotics or steroids.  2.  Chronic A. fib Rate controlled without medication.  Currently actually has bradycardia. Continue Eliquis.  3.  Type II DM, uncontrolled with hyperglycemia with peripheral  neuropathy without long-term insulin use Continue Neurontin. Currently on sliding scale insulin. Holding home regimen.  4.  HLD Continue statin  5.  CKD 3B Baseline serum creatinine around 2.0. Currently serum creatinine 1.6-1.8.  Monitor with diuresis.  6.  History of CAD, has history of COPD, elevated troponin Demand ischemia. Does not appear to have ACS. Monitor.  Cardiology consulted.  7.  Goals of care conversation. Palliative care consulted.  DNR.  8.  Anxiety. Mood disorder. Continue home regimen.  9.  PVD. ABI shows mildly abnormal resting ABI on the right.  Depending on palliative care consultation will refer to vascular surgery outpatient.  Patient remains asymptomatic.  Diet: Cardiac diet DVT Prophylaxis:   apixaban (ELIQUIS) tablet 2.5 mg Start: 04/21/21 2200 apixaban (ELIQUIS) tablet 2.5 mg    Advance goals of care discussion: DNR palliative care consulted.  Family Communication: family was present at bedside, at the time of interview.   Disposition:  Status is: Inpatient  Remains inpatient appropriate because:IV treatments appropriate due to intensity of illness or inability to take PO  Dispo: The patient is from: SNF              Anticipated d/c is to: SNF              Patient currently is not medically stable to d/c.   Difficult to place patient No  Subjective: Continues to have shortness of breath.  No nausea no vomiting.  No fever no chills.  Physical Exam:  General: Appear in mild distress, no Rash; Oral Mucosa Clear, moist. no Abnormal Neck Mass Or lumps, Conjunctiva normal  Cardiovascular: S1 and S2 Present, no Murmur, Respiratory: increased respiratory effort,  Bilateral Air entry present and bilateral  Crackles, no wheezes Abdomen: Bowel Sound present, Soft and no tenderness Extremities: trace Pedal edema Neurology: alert and oriented to time, place, and person affect appropriate. no new focal deficit Gait not checked due to patient  safety concerns   Vitals:   04/23/21 1146 04/23/21 1436 04/23/21 1459 04/23/21 1830  BP: (!) 173/61  (!) 167/49   Pulse: (!) 50  71   Resp: 18  18 (!) 22  Temp: 97.6 F (36.4 C)  98 F (36.7 C)   TempSrc:      SpO2: 94%  93% 96%  Weight:  71.6 kg      Intake/Output Summary (Last 24 hours) at 04/23/2021 1853 Last data filed at 04/23/2021 1700 Gross per 24 hour  Intake 480 ml  Output 2900 ml  Net -2420 ml    Filed Weights   04/23/21 1436  Weight: 71.6 kg    Data Reviewed: I have personally reviewed and interpreted daily labs, tele strips, imaging. I reviewed all nursing notes, pharmacy notes, vitals, pertinent old records I have discussed plan of care as described above with RN and patient/family.  CBC: Recent Labs  Lab 04/21/21 1836 04/22/21 0354  WBC 15.5* 5.7  NEUTROABS 9.6*  --   HGB 11.1* 9.8*  HCT 37.6* 32.0*  MCV 88.3 85.6  PLT 379 378    Basic Metabolic Panel: Recent Labs  Lab 04/21/21 1836 04/22/21 0354 04/23/21 0453 04/23/21 0820 04/23/21 1632  NA 135 136  --  136 135  K 4.3 4.6  --  4.3 3.9  CL 99 101  --  97* 95*  CO2 29 27  --  31 31  GLUCOSE 257* 248*  --  150* 264*  BUN 23 30*  --  37* 39*  CREATININE 1.61* 1.80*  --  1.52* 1.55*  CALCIUM 8.5* 8.2*  --  8.9 8.7*  MG  --   --  2.0 2.2 1.9     Studies: US ARTERIAL ABI (SCREENING LOWER EXTREMITY)  Result Date: 04/23/2021 CLINICAL DATA:  75 year old male with a history of cardiovascular risk factors EXAM: NONINVASIVE PHYSIOLOGIC VASCULAR STUDY OF BILATERAL LOWER EXTREMITIES TECHNIQUE: Evaluation of both lower extremities was performed at rest, including calculation of ankle-brachial indices, multiple segmental pressure evaluation, segmental Doppler and segmental pulse volume recording. COMPARISON:  None. FINDINGS: Right ABI:  0.55 Left ABI:  0.94 Right Lower Extremity: Segmental Doppler at the right ankle demonstrates monophasic waveforms Left Lower Extremity: Segmental Doppler at the left  ankle demonstrates monophasic posterior tibial artery and biphasic dorsalis pedis IMPRESSION: Right: Resting ABI in the moderate range arterial occlusive disease. Segmental exam at the ankle demonstrates monophasic waveforms, indicative of more proximal occlusive disease. Left: Resting ABI within normal limits. Segmental exam demonstrates evidence of posterior tibial arterial occlusive disease. Signed, Dulcy Fanny. Dellia Nims, RPVI [facility] Electronically Signed   By: Corrie Mckusick D.O.   On: 04/23/2021 11:54   DG Chest Port 1 View  Result Date: 04/23/2021 CLINICAL DATA:  Shortness of breath. EXAM: PORTABLE CHEST 1 VIEW COMPARISON:  Radiograph 2 days ago. FINDINGS: Post median sternotomy and CABG. Similar cardiomegaly. Improving interstitial opacities likely improving pulmonary edema. There are small bilateral pleural effusions, with increase on the right. No pneumothorax. Stable osseous structures. IMPRESSION: Improving interstitial opacities likely improving pulmonary edema. Small bilateral pleural effusions, with increase on the right. Stable cardiomegaly. Electronically Signed   By: Keith Rake M.D.   On:  04/23/2021 18:45    Scheduled Meds:  apixaban  2.5 mg Oral Q12H   vitamin C  250 mg Oral BID   busPIRone  5 mg Oral BID   clopidogrel  75 mg Oral Daily   ferrous sulfate  325 mg Oral BID WC   furosemide  40 mg Intravenous Q12H   gabapentin  100 mg Oral TID   guaiFENesin  600 mg Oral BID   hydrALAZINE  50 mg Oral Q8H   hydrocortisone   Rectal BID   insulin aspart  0-15 Units Subcutaneous TID AC & HS   insulin glargine  15 Units Subcutaneous Q2200   ipratropium  0.5 mg Nebulization QID   isosorbide mononitrate  30 mg Oral Daily   levalbuterol  0.63 mg Nebulization QID   metoprolol tartrate  12.5 mg Oral BID   multivitamin-lutein  1 capsule Oral Daily   nicotine  21 mg Transdermal Daily   pantoprazole  40 mg Oral Daily    polyethylene glycol  17 g Oral Daily   Ensure Max Protein  11 oz Oral BID   simvastatin  20 mg Oral QPM   traZODone  50 mg Oral QHS   Continuous Infusions: PRN Meds: acetaminophen **OR** acetaminophen, ALPRAZolam, bisacodyl, lactase, ondansetron **OR** ondansetron (ZOFRAN) IV, polyethylene glycol  Time spent: 35 minutes  Author: Berle Mull, MD [facility] 04/23/2021 6:53 PM  To reach On-call, see care teams to locate the attending and reach out via [email or web address]. Between 7PM-7AM, please contact night-coverage If you still have difficulty reaching the attending provider, please page the [facility] (Director on Call) for [facility] on amion for assistance.

## 2021-04-23 NOTE — Consult Note (Signed)
Consultation Note Date: 04/23/2021   Patient Name: [name]  DOB: 30-Aug-1946  MRN: [id number]  Age / Sex: 75 y.o., male  PCP: [name], MD Referring Physician: Lavina Hamman, MD  Reason for Consultation: Establishing goals of care  HPI/Patient Profile: 75 y.o. male  with past medical history of stroke, HFpEF, atrial fibrillation on Eliquis, CAD, hypertension, COPD, CKD stage 3b, peripheral vascular disease, diabetes, GERD, prostate cancer admitted from rehab facility on 04/21/2021 with respiratory distress requiring transient BiPAP due to CHF exacerbation. Noted outpatient palliative following via [facility].   Clinical Assessment and Goals of Care: I met today with [name] but no family present. He is finishing up a breathing treatment. I assist him with setting up his lunch. He shares that his wife was here this morning and will likely be back tomorrow. We reviewed his progression of his illness and limited progress and benefit of rehab. I explained to him the role of palliative care as support to patients and families dealing with difficult illness to help them with where to go from here and focus their care on what is most important to them. I asked if he and his wife have discussed this before. He tells me that they have not exactly discussed this. He does share with me "I'm ready when my time comes." He agrees to meet together with his wife to discuss further.   I called and spoke with [name]. She agrees that meeting together would be helpful. She shares with me that he has been to rehab twice and has made no progress. She also shares that she cannot life him. She shares that their doctor mentioned to them that they can consider hospice at home. I expressed to Ms. Rhines that I do feel that this could be a good option for them. We discussed briefly the benefits of hospice at home. We agree to all talk  further in the morning about these options and how to optimize [name] time to focus on what is most important to him.   All questions/concerns addressed. Emotional support provided.   Primary Decision Maker PATIENT    SUMMARY OF RECOMMENDATIONS   - Family meeting tomorrow morning ~0830 to further discuss [place] and explore the option of hospice support at home.   Code Status/Advance Care Planning: DNR   Symptom Management:  Per attending and cardiology.   Palliative Prophylaxis:  Bowel Regimen and Delirium Protocol  Prognosis:  < 6 months likely  Discharge Planning: To Be Determined      Primary Diagnoses: Present on Admission:  Acute respiratory failure ([place])   I have reviewed the medical record, interviewed the patient and family, and examined the patient. The following aspects are pertinent.  Past Medical History:  Diagnosis Date   Arrhythmia    atrial fibrillation   Atrial fibrillation ([facility] [place])    Basal cell carcinoma 05/2019   right nasal ala, Tx: EDC   Basal cell carcinoma 04/12/2019   Left posterior ear. Nodular pattern, excoriated.  CHF (congestive heart failure) (HCC)    COPD (chronic obstructive pulmonary disease) (HCC)    Coronary artery disease    Diabetes mellitus without complication (HCC)    GERD (gastroesophageal reflux disease)    HLD (hyperlipidemia)    Hypertension    Peripheral vascular disease (HCC)    Pneumonia    Prostate cancer ([place])    Squamous cell carcinoma of skin 08/10/2018   Left upper arm above elbow. [facility].   Squamous cell carcinoma of skin 09/07/2018   Right forearm, below elbow. [facility]. [facility] 01/10/2019.   Stroke [facility])    Social History   Socioeconomic History   Marital status: Married    Spouse name: Not on file   Number of children: Not on file   Years of education: Not on file   Highest education level: Not on file  Occupational History   Not on file  Tobacco Use   Smoking status: Former    Packs/day: 1.00     Years: 54.00    Pack years: 54.00    Types: Cigarettes   Smokeless tobacco: Never  Vaping Use   Vaping Use: Never used  Substance and Sexual Activity   Alcohol use: No    Alcohol/week: 0.0 standard drinks   Drug use: No   Sexual activity: Yes    Birth control/protection: None    Comment: Married  Other Topics Concern   Not on file  Social History Narrative   Not on file   Social Determinants of Health   Financial Resource Strain: Not on file  Food Insecurity: Not on file  Transportation Needs: Not on file  Physical Activity: Not on file  Stress: Not on file  Social Connections: Not on file   Family History  Problem Relation Age of Onset   Lung cancer Mother    Heart attack Father    Hypertension Other    Cancer Maternal Grandfather        prostate   Cancer Paternal Grandmother        breast   Scheduled Meds:  apixaban  2.5 mg Oral Q12H   busPIRone  5 mg Oral BID   clopidogrel  75 mg Oral Daily   feeding supplement (GLUCERNA SHAKE)  237 mL Oral TID BM   ferrous sulfate  325 mg Oral BID WC   furosemide  40 mg Intravenous Q12H   gabapentin  100 mg Oral TID   guaiFENesin  600 mg Oral BID   hydrocortisone   Rectal BID   insulin aspart  0-15 Units Subcutaneous TID AC & HS   insulin glargine  15 Units Subcutaneous Q2200   ipratropium  0.5 mg Nebulization QID   levalbuterol  0.63 mg Nebulization QID   multivitamin-lutein  1 capsule Oral Daily   nicotine  21 mg Transdermal Daily   pantoprazole  40 mg Oral Daily   polyethylene glycol  17 g Oral Daily   simvastatin  20 mg Oral QPM   traZODone  50 mg Oral QHS   Continuous Infusions: PRN Meds:.acetaminophen **OR** acetaminophen, ALPRAZolam, bisacodyl, lactase, ondansetron **OR** ondansetron (ZOFRAN) IV, polyethylene glycol Allergies  Allergen Reactions   Lactose Intolerance (Gi)    Review of Systems  Constitutional:  Positive for activity change.  Respiratory:  Positive for shortness of breath.    Physical  Exam Vitals and nursing note reviewed.  Constitutional:      General: He is not in acute distress.    Appearance: He is ill-appearing.  Cardiovascular:  Rate and Rhythm: Bradycardia present.  Pulmonary:     Comments: Slightly labored at rest Abdominal:     General: Abdomen is flat.  Neurological:     Mental Status: He is alert and oriented to person, place, and time.    Vital Signs: BP (!) 174/50 (BP Location: Left Arm)   Pulse (!) 47   Temp 98.2 F (36.8 C) (Oral)   Resp 18   SpO2 95%  Pain Scale: 0-10   Pain Score: 0-No pain   SpO2: SpO2: 95 % O2 Device:SpO2: 95 % O2 Flow Rate: .O2 Flow Rate (L/min): 3 L/min  IO: Intake/output summary:  Intake/Output Summary (Last 24 hours) at 04/23/2021 0915 Last data filed at 04/23/2021 0400 Gross per 24 hour  Intake 480 ml  Output 1500 ml  Net -1020 ml    LBM: Last BM Date: 04/22/21 Baseline Weight:   Most recent weight:       Palliative Assessment/Data:     Time In/Out: 1120-1150, 4461-9012 Time Total: 50 min Greater than 50%  of this time was spent counseling and coordinating care related to the above assessment and plan.  Signed by: Vinie Sill, NP Palliative Medicine Team Pager # [phone] (M-F 8a-5p) Team Phone # [phone] (Nights/Weekends)

## 2021-04-23 NOTE — Progress Notes (Signed)
Pt is having runs of Vtach, he had them last night and this morning x 3, calcium, potassium, and magnesium seem to be WNL, he is Afib in the 70-80's with PVC's and sometimes his HR drops to the 40's . MD Posey Pronto made aware.

## 2021-04-23 NOTE — Progress Notes (Signed)
PT Cancellation Note  Patient Details Name: [name] MRN: [id number] DOB: [birth year]   Cancelled Treatment:    Reason Eval/Treat Not Completed: Patient at procedure or test/unavailable. Pt off floor for ultrasound. PT will check in as time allows/as able.    Salem Caster. Fairly IV, PT, DPT Physical Therapist- [facility]  04/23/2021, 10:20 AM

## 2021-04-23 NOTE — Progress Notes (Signed)
[facility] Encounter Note  Patient: [name] / Admit Date: 04/21/2021 / Date of Encounter: 04/23/2021, 1:39 PM   Subjective: Patient has slight improvements in congestive heart failure and pulmonary edema and shortness of breath.  Urine output 1900 mL.  Patient's atrial fibrillation controlled at 80 bpm.  Glomerular filtration rate stable at 47.  No evidence of anginal symptoms or myocardial infarction  Review of Systems: Positive for: Shortness of breath Negative for: Vision change, hearing change, syncope, dizziness, nausea, vomiting,diarrhea, bloody stool, stomach pain, cough, congestion, diaphoresis, urinary frequency, urinary pain,skin lesions, skin rashes Others previously listed  Objective: Telemetry: Atrial fibrillation with bundle branch block at 64 bpm Physical Exam: Blood pressure (!) 173/61, pulse (!) 50, temperature 97.6 F (36.4 C), resp. rate 18, SpO2 94 %. There is no height or weight on file to calculate BMI. General: Well developed, well nourished, in no acute distress. Head: Normocephalic, atraumatic, sclera non-icteric, no xanthomas, nares are without discharge. Neck: No apparent masses Lungs: Normal respirations with few wheezes, no rhonchi, no rales , basilar crackles   Heart: Regular rate and rhythm, normal S1 S2, no murmur, no rub, no gallop, PMI is normal size and placement, carotid upstroke normal without bruit, jugular venous pressure normal Abdomen: Soft, non-tender, non-distended with normoactive bowel sounds. No hepatosplenomegaly. Abdominal aorta is normal size without bruit Extremities: Trace edema, no clubbing, no cyanosis, no ulcers,  Peripheral: 2+ radial, 2+ femoral, 2+ dorsal pedal pulses Neuro: Alert and oriented. Moves all extremities spontaneously. Psych:  Responds to questions appropriately with a normal affect.   Intake/Output Summary (Last 24 hours) at 04/23/2021 1339 Last data filed at 04/23/2021 1100 Gross per 24 hour   Intake 720 ml  Output 1900 ml  Net -1180 ml    Inpatient Medications:   apixaban  2.5 mg Oral Q12H   busPIRone  5 mg Oral BID   clopidogrel  75 mg Oral Daily   feeding supplement (GLUCERNA SHAKE)  237 mL Oral TID BM   ferrous sulfate  325 mg Oral BID WC   furosemide  40 mg Intravenous Q12H   gabapentin  100 mg Oral TID   guaiFENesin  600 mg Oral BID   hydrALAZINE  50 mg Oral Q8H   hydrocortisone   Rectal BID   insulin aspart  0-15 Units Subcutaneous TID AC & HS   insulin glargine  15 Units Subcutaneous Q2200   ipratropium  0.5 mg Nebulization QID   isosorbide mononitrate  30 mg Oral Daily   levalbuterol  0.63 mg Nebulization QID   multivitamin-lutein  1 capsule Oral Daily   nicotine  21 mg Transdermal Daily   pantoprazole  40 mg Oral Daily   polyethylene glycol  17 g Oral Daily   simvastatin  20 mg Oral QPM   traZODone  50 mg Oral QHS   Infusions:   Labs: Recent Labs    04/22/21 0354 04/23/21 0453 04/23/21 0820  NA 136  --  136  K 4.6  --  4.3  CL 101  --  97*  CO2 27  --  31  GLUCOSE 248*  --  150*  BUN 30*  --  37*  CREATININE 1.80*  --  1.52*  CALCIUM 8.2*  --  8.9  MG  --  2.0 2.2   No results for input(s): AST, ALT, ALKPHOS, BILITOT, PROT, ALBUMIN in the last 72 hours. Recent Labs    04/21/21 1836 04/22/21 0354  WBC 15.5* 5.7  NEUTROABS 9.6*  --  HGB 11.1* 9.8*  HCT 37.6* 32.0*  MCV 88.3 85.6  PLT 379 267   No results for input(s): CKTOTAL, CKMB, TROPONINI in the last 72 hours. Invalid input(s): POCBNP No results for input(s): HGBA1C in the last 72 hours.   Weights: There were no vitals filed for this visit.   Radiology/Studies:  DG Chest 1 View  Result Date: 04/02/2021 CLINICAL DATA:  Confusion, weakness and elevated white blood cell count. EXAM: CHEST  1 VIEW COMPARISON:  Single-view of the chest 02/21/2021 and 03/01/2021. CT chest 02/02/2021. FINDINGS: The lungs are emphysematous. There is left basilar atelectasis. The right lung is  clear. Cardiomegaly. The patient is status post CABG. Aortic atherosclerosis. No pneumothorax or pleural fluid. IMPRESSION: No acute disease. Left basilar atelectasis. Cardiomegaly. Aortic Atherosclerosis (ICD10-I70.0) and Emphysema (ICD10-J43.9). Electronically Signed   By: Inge Rise M.D.   On: 04/02/2021 17:04   CT Head Wo Contrast  Result Date: 04/02/2021 CLINICAL DATA:  Generalized weakness and altered mental status EXAM: CT HEAD WITHOUT CONTRAST TECHNIQUE: Contiguous axial images were obtained from the base of the skull through the vertex without intravenous contrast. COMPARISON:  02/19/2021 FINDINGS: Brain: No evidence of acute infarction, hemorrhage, hydrocephalus, extra-axial collection or mass lesion/mass effect. Mild atrophic and chronic white matter ischemic changes are noted. Vascular: No hyperdense vessel or unexpected calcification. Skull: Normal. Negative for fracture or focal lesion. Sinuses/Orbits: No acute finding. Other: None. IMPRESSION: Chronic atrophic and ischemic changes without acute abnormality. Electronically Signed   By: Inez Catalina M.D.   On: 04/02/2021 16:46   US ARTERIAL ABI (SCREENING LOWER EXTREMITY)  Result Date: 04/23/2021 CLINICAL DATA:  75 year old male with a history of cardiovascular risk factors EXAM: NONINVASIVE PHYSIOLOGIC VASCULAR STUDY OF BILATERAL LOWER EXTREMITIES TECHNIQUE: Evaluation of both lower extremities was performed at rest, including calculation of ankle-brachial indices, multiple segmental pressure evaluation, segmental Doppler and segmental pulse volume recording. COMPARISON:  None. 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 Segmental exam  at the ankle demonstrates monophasic waveforms, indicative of more proximal occlusive disease. Left: Resting ABI within normal limits. Segmental exam demonstrates evidence of posterior tibial arterial occlusive disease. Signed, Dulcy Fanny. Dellia Nims, RPVI [facility] Radiology Electronically Signed   By: Corrie Mckusick D.O.   On: 04/23/2021 11:54   DG Chest Port 1 View  Result Date: 04/21/2021 CLINICAL DATA:  Respiratory distress EXAM: PORTABLE CHEST 1 VIEW COMPARISON:  04/07/2021 FINDINGS: Post CABG changes. Stable cardiomegaly. Pulmonary vascular congestion with diffuse alveolar and interstitial opacities bilaterally. Probable small bilateral pleural effusions. No pneumothorax. IMPRESSION: Findings suggestive of CHF with pulmonary edema. A superimposed infectious process would be difficult to exclude. Electronically Signed   By: Davina Poke D.O.   On: 04/21/2021 19:07   DG Chest Port 1 View  Result Date: 04/07/2021 CLINICAL DATA:  Shortness of breath EXAM: PORTABLE CHEST 1 VIEW COMPARISON:  Apr 02, 2021 FINDINGS: There is again cardiomegaly. There is pulmonary venous hypertension. There is extensive interstitial thickening and airspace opacity, likely representing interstitial and alveolar pulmonary edema. There is a left pleural effusion. Patient is status post coronary artery bypass grafting. There is aortic atherosclerosis. No bone lesions. IMPRESSION: Cardiomegaly with pulmonary vascular congestion. Suspected interstitial alveolar edema with left pleural effusion.  The overall appearance is consistent with congestive heart failure. It should be noted that patchy superimposed pneumonia cannot be excluded in this circumstance. Status post coronary artery bypass grafting. Aortic Atherosclerosis (ICD10-I70.0). Electronically Signed   By: Lowella Grip III M.D.   On: 04/07/2021 10:32     Assessment and Recommendation  75 y.o. male with known coronary  disease status post coronary bypass graft diabetes hypertension hyperlipidemia previous cerebrovascular accident paroxysmal nonvalvular atrial fibrillation with acute on chronic systolic dysfunction congestive heart failure and pulmonary edema without evidence of acute coronary syndrome 1.  Continue intravenous Lasix for acute on chronic systolic dysfunction congestive heart failure and pulmonary edema 2.  Continue anticoagulation for further risk reduction and stroke with atrial fibrillation without change 3.  Continue supplemental oxygen for hypoxia and shortness of breath 4.  No need for additional medication management of her heart rate control at this time 5.  No further cardiac diagnosis necessary at this time 6.  Begin ambulation and follow-up for improvements of symptoms and begin cardiac rehabilitation  Signed, Serafina Royals M.D. FACC

## 2021-04-23 NOTE — Progress Notes (Signed)
Initial Nutrition Assessment  DOCUMENTATION CODES:   Severe malnutrition in context of chronic illness  INTERVENTION:   Ensure Max protein supplement BID, each supplement provides 150kcal and 30g of protein.  Vitamin C 258m po BID  NUTRITION DIAGNOSIS:   Severe Malnutrition related to chronic illness (COPD) as evidenced by moderate fat depletion, severe muscle depletion, moderate muscle depletion  GOAL:   Patient will meet greater than or equal to 90% of their needs  MONITOR:   PO intake, Supplement acceptance, Labs, Weight trends, Skin, I & O's  REASON FOR ASSESSMENT:   Consult Assessment of nutrition requirement/status  ASSESSMENT:   75y.o. male with medical history significant for atrial fibrillation on Eliquis, COPD, CHF, type 2 diabetes mellitus, GERD, dyslipidemia, hypertension, CVA, CAD s/p CABG, CKD III, prostate cancer, COVID 19 (February 2021) and peripheral vascular disease who is admitted with CHF  Met with pt in room today. Pt reports good appetite and oral intake pta and in hospital; pt eating 100% of meals. Pt reports that he does drink Ensure sometimes at home; pt prefers vanilla or chocolate flavors. Of note, pt is lactose intolerant. Per chart, pt is down 2lbs(1%) over the past 2 months; this is not significant.   Medications reviewed and include: plavix, ferrous sulfate, lasix, insulin, ocuvite, nicotine, protonix, miralax  Labs reviewed: K 4.3 wnl, BUN 37(H), creat 1.52(H), Mg 2.2 wnl BNP-1488.9(H)- 6/19 Hgb 9.8(L), Hct 32.0(L) cbgs- 146, 251 x 24 hrs AIC- 6.8(H)- 6/1  NUTRITION - FOCUSED PHYSICAL EXAM:  Flowsheet Row Most Recent Value  Orbital Region Moderate depletion  Upper Arm Region Severe depletion  Thoracic and Lumbar Region Moderate depletion  Buccal Region Moderate depletion  Temple Region Moderate depletion  Clavicle Bone Region Severe depletion  Clavicle and Acromion Bone Region Severe depletion  Scapular Bone Region Severe  depletion  Dorsal Hand Severe depletion  Patellar Region Severe depletion  Anterior Thigh Region Severe depletion  Posterior Calf Region Severe depletion  Edema (RD Assessment) Mild  Hair Reviewed  Eyes Reviewed  Mouth Reviewed  Skin Reviewed  Nails Reviewed      Diet Order:   Diet Order             Diet heart healthy/carb modified Room service appropriate? Yes; Fluid consistency: Thin  Diet effective now                  EDUCATION NEEDS:   Education needs have been addressed  Skin:  Skin Assessment: Reviewed RN Assessment (Stage II sacral wound- noted on 6/3)  Last BM:  6/20  Height:   Ht Readings from Last 1 Encounters:  04/04/21 5' 6"  (1.676 m)    Weight:   Wt Readings from Last 1 Encounters:  04/23/21 71.6 kg    Ideal Body Weight:  64.5 kg  BMI:  Body mass index is 25.48 kg/m.  Estimated Nutritional Needs:   Kcal:  1800-2100kcal/day  Protein:  95-110g/day  Fluid:  1.7-1.9L/day  CKoleen DistanceMS, RD, LDN Please refer to [facility] [place] and/or RD on-call/weekend/after hours pager

## 2021-04-23 NOTE — Progress Notes (Signed)
Inpatient Diabetes Program Recommendations  AACE/ADA: New Consensus Statement on Inpatient Glycemic Control   Target Ranges:  Prepandial:   less than 140 mg/dL      Peak postprandial:   less than 180 mg/dL (1-2 hours)      Critically ill patients:  140 - 180 mg/dL   Results for [name], CRETELLA (MRN [id number]) as of 04/23/2021 10:59  Ref. Range 04/22/2021 07:28 04/22/2021 12:33 04/22/2021 17:49 04/22/2021 22:42 04/23/2021 08:07  Glucose-Capillary Latest Ref Range: 70 - 99 mg/dL 222 (H) 321 (H) 234 (H) 299 (H) 146 (H)    Review of Glycemic Control  Diabetes history: DM2 Outpatient Diabetes medications: Lantus 15 units QHS, Novolog 0-9 units TID with meals Current orders for Inpatient glycemic control: Lantus 15 units QHS, Novolog 0-15 units TID & HS  Inpatient Diabetes Program Recommendations:    Insulin: Noted patient received Solumedrol 40 mg x1 on 04/21/21 and Solumedrol 40 x1 on 04/22/21 which contributed to hyperglycemia yesterday. If post prandial glucose remains consistently over 180 mg/dl, please consider ordering Novolog 3 units TID with meals if patient eats at least 50% of meals.  Thanks, Barnie Alderman, RN, MSN, CDE Diabetes Coordinator Inpatient Diabetes Program [phone] (Team Pager from 8am to 5pm)

## 2021-04-24 DIAGNOSIS — I5033 Acute on chronic diastolic (congestive) heart failure: Secondary | ICD-10-CM

## 2021-04-24 LAB — CBC
HCT: 31.1 % — ABNORMAL LOW (ref 39.0–52.0)
Hemoglobin: 9.6 g/dL — ABNORMAL LOW (ref 13.0–17.0)
MCH: 26.3 pg (ref 26.0–34.0)
MCHC: 30.9 g/dL (ref 30.0–36.0)
MCV: 85.2 fL (ref 80.0–100.0)
Platelets: 297 10*3/uL (ref 150–400)
RBC: 3.65 MIL/uL — ABNORMAL LOW (ref 4.22–5.81)
RDW: 22.7 % — ABNORMAL HIGH (ref 11.5–15.5)
WBC: 9.3 10*3/uL (ref 4.0–10.5)
nRBC: 0 % (ref 0.0–0.2)

## 2021-04-24 LAB — ECHOCARDIOGRAM LIMITED
AV Mean grad: 12.5 mmHg
AV Peak grad: 20.8 mmHg
Ao pk vel: 2.28 m/s
P 1/2 time: 267 msec
S' Lateral: 4.2 cm
Weight: 2525.59 oz

## 2021-04-24 LAB — BASIC METABOLIC PANEL
Anion gap: 7 (ref 5–15)
BUN: 38 mg/dL — ABNORMAL HIGH (ref 8–23)
CO2: 34 mmol/L — ABNORMAL HIGH (ref 22–32)
Calcium: 8.6 mg/dL — ABNORMAL LOW (ref 8.9–10.3)
Chloride: 96 mmol/L — ABNORMAL LOW (ref 98–111)
Creatinine, Ser: 1.53 mg/dL — ABNORMAL HIGH (ref 0.61–1.24)
GFR, Estimated: 47 mL/min — ABNORMAL LOW (ref >60)
Glucose, Bld: 77 mg/dL (ref 70–99)
Potassium: 4 mmol/L (ref 3.5–5.1)
Sodium: 137 mmol/L (ref 135–145)

## 2021-04-24 LAB — GLUCOSE, CAPILLARY
Glucose-Capillary: 110 mg/dL — ABNORMAL HIGH (ref 70–99)
Glucose-Capillary: 193 mg/dL — ABNORMAL HIGH (ref 70–99)
Glucose-Capillary: 227 mg/dL — ABNORMAL HIGH (ref 70–99)

## 2021-04-24 LAB — LEGIONELLA PNEUMOPHILA SEROGP 1 UR AG: L. pneumophila Serogp 1 Ur Ag: NEGATIVE

## 2021-04-24 LAB — MAGNESIUM: Magnesium: 2.4 mg/dL (ref 1.7–2.4)

## 2021-04-24 MED ORDER — IPRATROPIUM BROMIDE 0.02 % IN SOLN
0.5000 mg | Freq: Three times a day (TID) | RESPIRATORY_TRACT | Status: DC
  Administered 2021-04-24 – 2021-04-25 (×4): 0.5 mg via RESPIRATORY_TRACT
  Filled 2021-04-24 (×4): qty 2.5

## 2021-04-24 MED ORDER — METOPROLOL TARTRATE 25 MG PO TABS
12.5000 mg | ORAL_TABLET | Freq: Every day | ORAL | Status: DC
  Administered 2021-04-25: 12.5 mg via ORAL
  Filled 2021-04-24: qty 1

## 2021-04-24 MED ORDER — LEVALBUTEROL HCL 0.63 MG/3ML IN NEBU
0.6300 mg | INHALATION_SOLUTION | Freq: Three times a day (TID) | RESPIRATORY_TRACT | Status: DC
  Administered 2021-04-24 – 2021-04-25 (×4): 0.63 mg via RESPIRATORY_TRACT
  Filled 2021-04-24 (×4): qty 3

## 2021-04-24 NOTE — Progress Notes (Signed)
[facility] Encounter Note  Patient: [name] / Admit Date: 04/21/2021 / Date of Encounter: 04/24/2021, 9:10 AM   Subjective: Patient has slight improvements in congestive heart failure and pulmonary edema and shortness of breath.  Urine output 3826 mL.  Patient's atrial fibrillation controlled at 52 bpm.  Glomerular filtration rate stable at 47.  No evidence of anginal symptoms or myocardial infarction.  The patient was placed on a beta-blocker for cardiomyopathy and LV systolic dysfunction seen on echocardiogram.  Patient does have bradycardia at this time but has overall feeling better  Echocardiogram mild LV systolic dysfunction with ejection fraction of 40%  Review of Systems: Positive for: Shortness of breath Negative for: Vision change, hearing change, syncope, dizziness, nausea, vomiting,diarrhea, bloody stool, stomach pain, cough, congestion, diaphoresis, urinary frequency, urinary pain,skin lesions, skin rashes Others previously listed  Objective: Telemetry: Atrial fibrillation with bundle branch block at 52 bpm Physical Exam: Blood pressure (!) 155/57, pulse 74, temperature 98 F (36.7 C), resp. rate 18, weight 71.6 kg, SpO2 95 %. Body mass index is 25.48 kg/m. General: Well developed, well nourished, in no acute distress. Head: Normocephalic, atraumatic, sclera non-icteric, no xanthomas, nares are without discharge. Neck: No apparent masses Lungs: Normal respirations with few wheezes, no rhonchi, no rales , basilar crackles   Heart: Regular rate and rhythm, normal S1 S2, no murmur, no rub, no gallop, PMI is normal size and placement, carotid upstroke normal without bruit, jugular venous pressure normal Abdomen: Soft, non-tender, non-distended with normoactive bowel sounds. No hepatosplenomegaly. Abdominal aorta is normal size without bruit Extremities: Trace edema, no clubbing, no cyanosis, no ulcers,  Peripheral: 2+ radial, 2+ femoral, 2+ dorsal pedal  pulses Neuro: Alert and oriented. Moves all extremities spontaneously. Psych:  Responds to questions appropriately with a normal affect.   Intake/Output Summary (Last 24 hours) at 04/24/2021 0910 Last data filed at 04/24/2021 0824 Gross per 24 hour  Intake 493.85 ml  Output 3300 ml  Net -2806.15 ml     Inpatient Medications:   apixaban  2.5 mg Oral Q12H   vitamin C  250 mg Oral BID   busPIRone  5 mg Oral BID   clopidogrel  75 mg Oral Daily   ferrous sulfate  325 mg Oral BID WC   furosemide  40 mg Intravenous Q12H   gabapentin  100 mg Oral TID   guaiFENesin  600 mg Oral BID   hydrALAZINE  50 mg Oral Q8H   hydrocortisone   Rectal BID   insulin aspart  0-15 Units Subcutaneous TID AC & HS   insulin glargine  15 Units Subcutaneous Q2200   ipratropium  0.5 mg Nebulization QID   isosorbide mononitrate  30 mg Oral Daily   levalbuterol  0.63 mg Nebulization QID   metoprolol tartrate  12.5 mg Oral BID   multivitamin-lutein  1 capsule Oral Daily   nicotine  21 mg Transdermal Daily   pantoprazole  40 mg Oral Daily   polyethylene glycol  17 g Oral Daily   Ensure Max Protein  11 oz Oral BID   simvastatin  20 mg Oral QPM   traZODone  50 mg Oral QHS   Infusions:   sodium chloride Stopped (04/23/21 2311)    Labs: Recent Labs    04/23/21 1632 04/24/21 0456  NA 135 137  K 3.9 4.0  CL 95* 96*  CO2 31 34*  GLUCOSE 264* 77  BUN 39* 38*  CREATININE 1.55* 1.53*  CALCIUM 8.7* 8.6*  MG 1.9  2.4    No results for input(s): AST, ALT, ALKPHOS, BILITOT, PROT, ALBUMIN in the last 72 hours. Recent Labs    04/21/21 1836 04/22/21 0354 04/24/21 0456  WBC 15.5* 5.7 9.3  NEUTROABS 9.6*  --   --   HGB 11.1* 9.8* 9.6*  HCT 37.6* 32.0* 31.1*  MCV 88.3 85.6 85.2  PLT 379 267 297    No results for input(s): CKTOTAL, CKMB, TROPONINI in the last 72 hours. Invalid input(s): POCBNP No results for input(s): HGBA1C in the last 72 hours.   Weights: Filed Weights   04/23/21 1436  Weight:  71.6 kg     Radiology/Studies:  DG Chest 1 View  Result Date: 04/02/2021 CLINICAL DATA:  Confusion, weakness and elevated white blood cell count. EXAM: CHEST  1 VIEW COMPARISON:  Single-view of the chest 02/21/2021 and 03/01/2021. CT chest 02/02/2021. FINDINGS: The lungs are emphysematous. There is left basilar atelectasis. The right lung is clear. Cardiomegaly. The patient is status post CABG. Aortic atherosclerosis. No pneumothorax or pleural fluid. IMPRESSION: No acute disease. Left basilar atelectasis. Cardiomegaly. Aortic Atherosclerosis (ICD10-I70.0) and Emphysema (ICD10-J43.9). Electronically Signed   By: Inge Rise M.D.   On: 04/02/2021 17:04   CT Head Wo Contrast  Result Date: 04/02/2021 CLINICAL DATA:  Generalized weakness and altered mental status EXAM: CT HEAD WITHOUT CONTRAST TECHNIQUE: Contiguous axial images were obtained from the base of the skull through the vertex without intravenous contrast. COMPARISON:  02/19/2021 FINDINGS: Brain: No evidence of acute infarction, hemorrhage, hydrocephalus, extra-axial collection or mass lesion/mass effect. Mild atrophic and chronic white matter ischemic changes are noted. Vascular: No hyperdense vessel or unexpected calcification. Skull: Normal. Negative for fracture or focal lesion. Sinuses/Orbits: No acute finding. Other: None. IMPRESSION: Chronic atrophic and ischemic changes without acute abnormality. Electronically Signed   By: Inez Catalina M.D.   On: 04/02/2021 16:46   US ARTERIAL ABI (SCREENING LOWER EXTREMITY)  Result Date: 04/23/2021 CLINICAL DATA:  75 year old male with a history of cardiovascular risk factors EXAM: NONINVASIVE PHYSIOLOGIC VASCULAR STUDY OF BILATERAL LOWER EXTREMITIES TECHNIQUE: Evaluation of both lower extremities was performed at rest, including calculation of ankle-brachial indices, multiple segmental pressure evaluation, segmental Doppler and segmental pulse volume recording. COMPARISON:  None. FINDINGS: Right  ABI:  0.55 Left ABI:  0.94 Right Lower Extremity: Segmental Doppler at the right ankle demonstrates monophasic waveforms Left Lower Extremity: Segmental Doppler at the left ankle demonstrates monophasic posterior tibial artery and biphasic dorsalis pedis IMPRESSION: Right: Resting ABI in the moderate range arterial occlusive disease. Segmental exam at the ankle demonstrates monophasic waveforms, indicative of more proximal occlusive disease. Left: Resting ABI within normal limits. Segmental exam demonstrates evidence of posterior tibial arterial occlusive disease. Signed, Dulcy Fanny. Dellia Nims, RPVI [facility] Radiology Electronically Signed   By: Corrie Mckusick D.O.   On: 04/23/2021 11:54   DG Chest Port 1 View  Result Date: 04/23/2021 CLINICAL DATA:  Shortness of breath. EXAM: PORTABLE CHEST 1 VIEW COMPARISON:  Radiograph 2 days ago. FINDINGS: Post median sternotomy and CABG. Similar cardiomegaly. Improving interstitial opacities likely improving pulmonary edema. There are small bilateral pleural effusions, with increase on the right. No pneumothorax. Stable osseous structures. IMPRESSION: Improving interstitial opacities likely improving pulmonary edema. Small bilateral pleural effusions, with increase on the right. Stable cardiomegaly. Electronically Signed   By: Keith Rake M.D.   On: 04/23/2021 18:45   DG Chest Port 1 View  Result Date: 04/21/2021 CLINICAL DATA:  Respiratory distress EXAM: PORTABLE CHEST  1 VIEW COMPARISON:  04/07/2021 FINDINGS: Post CABG changes. Stable cardiomegaly. Pulmonary vascular congestion with diffuse alveolar and interstitial opacities bilaterally. Probable small bilateral pleural effusions. No pneumothorax. IMPRESSION: Findings suggestive of CHF with pulmonary edema. A superimposed infectious process would be difficult to exclude. Electronically Signed   By: Davina Poke D.O.   On: 04/21/2021 19:07   DG Chest Port 1  View  Result Date: 04/07/2021 CLINICAL DATA:  Shortness of breath EXAM: PORTABLE CHEST 1 VIEW COMPARISON:  Apr 02, 2021 FINDINGS: There is again cardiomegaly. There is pulmonary venous hypertension. There is extensive interstitial thickening and airspace opacity, likely representing interstitial and alveolar pulmonary edema. There is a left pleural effusion. Patient is status post coronary artery bypass grafting. There is aortic atherosclerosis. No bone lesions. IMPRESSION: Cardiomegaly with pulmonary vascular congestion. Suspected interstitial alveolar edema with left pleural effusion. The overall appearance is consistent with congestive heart failure. It should be noted that patchy superimposed pneumonia cannot be excluded in this circumstance. Status post coronary artery bypass grafting. Aortic Atherosclerosis (ICD10-I70.0). Electronically Signed   By: Lowella Grip III M.D.   On: 04/07/2021 10:32   ECHOCARDIOGRAM LIMITED  Result Date: 04/24/2021    ECHOCARDIOGRAM LIMITED REPORT   Patient Name:   Axzel Aja Date of Exam: 04/23/2021 Medical Rec #:  [id number]  Height:       66.0 in Accession #:    [id number] Weight:       157.8 lb Date of Birth:  Sep 21, 1946  BSA:          1.808 m Patient Age:    37 years   BP:           167/49 mmHg Patient Gender: M          HR:           75 bpm. Exam Location:  [facility] Procedure: 2D Echo, Intracardiac Opacification Agent, Limited Echo and Limited            Color Doppler Indications:     O84.16 Acute diastolic CHF  History:         Patient has prior history of Echocardiogram examinations. Risk                  Factors:Diabetes, Hypertension and Dyslipidemia. Atrial                  fibrillation. Congestive heart failure. COPD. Coronary artery                  disease. Peripheral vascular disease. Pneumonia.  Sonographer:     Wilford Sports Rodgers-Jones Referring Phys:  [id number] [place] Diagnosing Phys: Serafina Royals MD IMPRESSIONS  1. The left ventricle demonstrates  regional wall motion abnormalities (see scoring diagram/findings for description). Left ventricular diastolic parameters were normal.  2. Right ventricular systolic function is normal. The right ventricular size is normal.  3. Left atrial size was mildly dilated.  4. The mitral valve is normal in structure. Mild to moderate mitral valve regurgitation.  5. The aortic valve is normal in structure. Aortic valve regurgitation is mild. FINDINGS  Left Ventricle: The left ventricle demonstrates regional wall motion abnormalities. Severe hypokinesis of the left ventricular, apical inferolateral wall. Definity contrast agent was given IV to delineate the left ventricular endocardial borders. There is no left ventricular hypertrophy. Left ventricular diastolic parameters were normal. Right Ventricle: The right ventricular size is normal. No increase in right ventricular wall thickness. Right ventricular systolic function is normal. Left Atrium:  Left atrial size was mildly dilated. Right Atrium: Right atrial size was normal in size. Pericardium: There is no evidence of pericardial effusion. Mitral Valve: The mitral valve is normal in structure. Mild to moderate mitral valve regurgitation. Tricuspid Valve: The tricuspid valve is normal in structure. Tricuspid valve regurgitation is mild. Aortic Valve: The aortic valve is normal in structure. Aortic valve regurgitation is mild. Aortic regurgitation PHT measures 267 msec. Aortic valve mean gradient measures 12.5 mmHg. Aortic valve peak gradient measures 20.8 mmHg. Pulmonic Valve: The pulmonic valve was normal in structure. Pulmonic valve regurgitation is not visualized. Aorta: The aortic root and ascending aorta are structurally normal, with no evidence of dilitation. IAS/Shunts: No atrial level shunt detected by color flow Doppler. LEFT VENTRICLE PLAX 2D LVIDd:         5.40 cm LVIDs:         4.20 cm LV PW:         1.10 cm LV IVS:        1.10 cm  IVC IVC diam: 1.90 cm LEFT ATRIUM          Index LA diam:    4.70 cm 2.60 cm/m  AORTIC VALVE AV Vmax:      228.00 cm/s AV Vmean:     165.000 cm/s AV VTI:       0.468 m AV Peak Grad: 20.8 mmHg AV Mean Grad: 12.5 mmHg AI PHT:       267 msec  AORTA Ao Root diam: 3.60 cm MV E velocity: 136.00 cm/s Serafina Royals MD Electronically signed by Serafina Royals MD Signature Date/Time: 04/24/2021/8:59:18 AM    Final      Assessment and Recommendation  75 y.o. male with known coronary disease status post coronary bypass graft diabetes hypertension hyperlipidemia previous cerebrovascular accident paroxysmal nonvalvular atrial fibrillation with acute on chronic systolic dysfunction congestive heart failure and pulmonary edema without evidence of acute coronary syndrome 1.  Continue intravenous Lasix for acute on chronic systolic dysfunction congestive heart failure and pulmonary edema 2.  Continue anticoagulation for further risk reduction and stroke with atrial fibrillation without change 3.  Continue supplemental oxygen for hypoxia and shortness of breath 4.  No need for additional medication management of her heart rate control at this time 5.  No further cardiac diagnosis necessary at this time 6.  Begin ambulation and follow-up for improvements of symptoms and begin cardiac rehabilitation 7.  If patient ambulating well with no further significant symptoms possible discharge to home on oral Lasix at 40 mg each day with follow-up next week for further adjustments of medication management Signed, Serafina Royals M.D. FACC

## 2021-04-24 NOTE — Progress Notes (Signed)
Occupational Therapy Treatment Patient Details Name: [name] MRN: [id number] DOB: 1946/08/20 Today's Date: 04/24/2021    History of present illness 75 y.o. Caucasian male with medical history significant for atrial fibrillation on Eliquis, COPD, CHF, type 2 diabetes mellitus, GERD, dyslipidemia, hypertension, CVA and peripheral vascular disease, who presented to the emergency room with acute onset over the last hour prior to arrival to the ER at his rehab facility.  EMS reported a run of ventricular tachycardia and significant hypoxia with increased work of breathing.   OT comments  Upon entering the room, pt supine in bed and agreeable to OT intervention. Pt reports feeling much better this session and is very talkative throughout. Pt reports recent transfer back to bed after sitting up in recliner chair for several hours. Pt on 3 L O2 via [place] this session. Focus on B UE strengthening exercises with use of yellow resistive theraband. Pt returning demonstrations with min cuing. Pt performing 2 reps of 10 alternating punches, bicep curls, shoulder diagonals, and shoulder elevation exercises. Pt needing rest breaks with focus on pursed lip breathing with cuing. RR increase to 30's with therapeutic exercise. Pt continues to benefit from OT intervention.   Follow Up Recommendations  Supervision/Assistance - 24 hour;Home health OT    Equipment Recommendations  None recommended by OT    Recommendations for Other Services      Precautions / Restrictions Precautions Precautions: Fall       Mobility Bed Mobility Overal bed mobility: Needs Assistance Bed Mobility: Rolling Rolling: Min assist                                                                  Cognition Arousal/Alertness: Awake/alert Behavior During Therapy: WFL for tasks assessed/performed Overall Cognitive Status: Within Functional Limits for tasks assessed                                                      Pertinent Vitals/ Pain       Pain Assessment: No/denies pain   Frequency  Min 2X/week        Progress Toward Goals  OT Goals(current goals can now be found in the care plan section)  Progress towards OT goals: Progressing toward goals  Acute Rehab OT Goals Patient Stated Goal: Get stronger, return home OT Goal Formulation: With patient Time For Goal Achievement: 05/06/21 Potential to Achieve Goals: [place] Discharge plan needs to be updated;Frequency remains appropriate       AM-PAC OT "6 Clicks" Daily Activity     Outcome Measure   Help from another person eating meals?: A Little Help from another person taking care of personal grooming?: A Little Help from another person toileting, which includes using toliet, bedpan, or urinal?: A Lot Help from another person bathing (including washing, rinsing, drying)?: A Lot Help from another person to put on and taking off regular upper body clothing?: A Little Help from another person to put on and taking off regular lower body clothing?: A Lot 6 Click Score: 15    End of Session Equipment Utilized During Treatment: Oxygen (3 Ls)  OT Visit Diagnosis: Unsteadiness on feet (R26.81);Repeated falls (R29.6);Muscle weakness (generalized) (M62.81)   Activity Tolerance Patient limited by fatigue   Patient Left in bed;with call bell/phone within reach           Time: 1430-1455 OT Time Calculation (min): 25 min  Charges: OT General Charges $OT Visit: 1 Visit OT Treatments $Therapeutic Exercise: 23-37 mins  Darleen Crocker, MS, OTR/L , CBIS ascom [phone]  04/24/21, 3:49 PM

## 2021-04-24 NOTE — Evaluation (Addendum)
Physical Therapy Evaluation Patient Details Name: [name] MRN: [id number] DOB: [birth year] Today's Date: 04/24/2021   History of Present Illness  75 y.o. Caucasian male with medical history significant for atrial fibrillation on Eliquis, COPD, CHF, type 2 diabetes mellitus, GERD, dyslipidemia, hypertension, CVA and peripheral vascular disease, who presented to the emergency room with acute onset over the last hour prior to arrival to the ER at his rehab facility.  EMS reported a run of ventricular tachycardia and significant hypoxia with increased work of breathing.   Clinical Impression  Pt admitted with above diagnosis. Pt seated in long sitting in bed with HOB elevated with spouse present. Agreeable to PT eval. Pt resting on 3L/min and reports he is chronically on 2L/min at baseline at home. Pt presents with full sensation in BLE's and denies pain. Pt relies on modA for t/f to seated EoB with HOB elevated and hand over hand TC's and VC"s for UE sequencing to assist and LE management. Poor sitting balance noted with Requiring BUE support on bed to hold himself up. With use of BUE support able to maintain static sitting balance. Pt required modA STS to RW with need of 2-3 bouts of momentum to stand. Pt able to minimally weight shift R/L and perform alternating marches with heavy UE reliance on RW. Pt able to stand pivot transfer into recliner with good sequencing and safe use of RW. SPO2 decreased to 90% post mobility and HR at 63 BPM. Spouse reports pt prior to hospitalization and SNF, pt only performed household mobility distances and use of W/c and power chair for community tasks. Spouse assist with at home ADL's and only fair progress reported in SNF. Spouse educated on use of gait belt and safe t/f and ambulation handling techniques for her and pt safety. Per medical team documentation, pt and family agree on at home hospice placement. Pt d/c rec is at home hospice with 24/7 supervision.Pt currently  with functional limitations due to the deficits listed below (see PT Problem List). Pt will benefit from skilled PT to increase their independence and safety with mobility to allow discharge to the venue listed below.       Follow Up Recommendations Supervision/Assistance - 24 hour;Other (comment) (Plan is for hospice care at home)    Equipment Recommendations       Recommendations for Other Services       Precautions / Restrictions Precautions Precautions: Fall Restrictions Weight Bearing Restrictions: No      Mobility  Bed Mobility Overal bed mobility: Needs Assistance Bed Mobility: Rolling Rolling: Mod assist   Supine to sit: Mod assist          Transfers Overall transfer level: Needs assistance Equipment used: Rolling walker (2 wheeled) Transfers: Sit to/from Stand Sit to Stand: Mod assist            Ambulation/Gait             General Gait Details: pt declines wanting to amb. Fatigued after t/f to recliner.  Stairs            Wheelchair Mobility    Modified Rankin (Stroke Patients Only)       Balance Overall balance assessment: Needs assistance Sitting-balance support: Bilateral upper extremity supported;Feet supported Sitting balance-Leahy Scale: Poor     Standing balance support: Bilateral upper extremity supported Standing balance-Leahy Scale: Poor  Pertinent Vitals/Pain Pain Assessment: No/denies pain Pain Score: 0-No pain    Home Living Family/patient expects to be discharged to:: Hospice/Palliative care Living Arrangements: Spouse/significant other Available Help at Discharge: Family;Available 24 hours/day Type of Home: House Home Access: Stairs to enter Entrance Stairs-Rails: Psychiatric nurse of Steps: 3-4 Home Layout: One level Home Equipment: Shower seat;Cane - single point;Wheelchair - Press photographer;Bedside commode;Walker - 4 wheels;Walker - 2  wheels;Wheelchair - power;Toilet riser;Tub bench Additional Comments: working on receiving hospital bed per wife    Prior Function Level of Independence: Needs assistance   Gait / Transfers Assistance Needed: Performing household walking distances in SNF. Commubnity amb, pt wheeled by wife in W/c and/or use of power chair.           Hand Dominance   Dominant Hand: Right    Extremity/Trunk Assessment   Upper Extremity Assessment Upper Extremity Assessment: Generalized weakness    Lower Extremity Assessment Lower Extremity Assessment: Generalized weakness    Cervical / Trunk Assessment Cervical / Trunk Assessment: Kyphotic  Communication   Communication: HOH  Cognition Arousal/Alertness: Awake/alert Behavior During Therapy: Flat affect Overall Cognitive Status: Within Functional Limits for tasks assessed                                 General Comments: Relies on wife to answer some questions most likely due to [facility]. ABle to hold basic conversation and answer simple questions.      General Comments      Exercises General Exercises - Lower Extremity Ankle Circles/Pumps: AROM;Both;10 reps Other Exercises Other Exercises: Pt's wife educated on safe t/f techniques and use of gait belt for her and pt's safety. Other Exercises: T/f from seated EOB to recliner with stand step t/f.   Assessment/Plan    PT Assessment Patient needs continued PT services  PT Problem List Decreased strength;Decreased activity tolerance;Decreased balance;Decreased mobility;Decreased safety awareness;Pain;Cardiopulmonary status limiting activity       PT Treatment Interventions DME instruction;Gait training;Functional mobility training;Therapeutic activities;Stair training;Therapeutic exercise;Balance training;Neuromuscular re-education;Patient/family education    PT Goals (Current goals can be found in the Care Plan section)  Acute Rehab PT Goals Patient Stated Goal: Get  stronger, return home PT Goal Formulation: With patient/family Time For Goal Achievement: 05/08/21 Potential to Achieve Goals: Fair    Frequency Min 2X/week   Barriers to discharge        Co-evaluation               AM-PAC PT "6 Clicks" Mobility  Outcome Measure Help needed turning from your back to your side while in a flat bed without using bedrails?: A Lot Help needed moving from lying on your back to sitting on the side of a flat bed without using bedrails?: A Lot Help needed moving to and from a bed to a chair (including a wheelchair)?: A Lot Help needed standing up from a chair using your arms (e.g., wheelchair or bedside chair)?: A Lot Help needed to walk in hospital room?: A Lot Help needed climbing 3-5 steps with a railing? : A Lot 6 Click Score: 12    End of Session Equipment Utilized During Treatment: Oxygen;Gait belt Activity Tolerance: Patient limited by fatigue Patient left: in chair;with family/visitor present;with call bell/phone within reach;with chair alarm set   PT Visit Diagnosis: Unsteadiness on feet (R26.81);Other abnormalities of gait and mobility (R26.89);Repeated falls (R29.6);Muscle weakness (generalized) (M62.81);History of falling (Z91.81);Difficulty in walking, not elsewhere classified (  R26.2)    Time: 8616-8372 PT Time Calculation (min) (ACUTE ONLY): 32 min   Charges:   PT Evaluation $PT Eval Moderate Complexity: 1 Mod PT Treatments $Therapeutic Activity: 23-37 mins        Cimone Fahey M. Fairly IV, PT, DPT Physical Therapist- [facility]  04/24/2021, 11:57 AM

## 2021-04-24 NOTE — Progress Notes (Signed)
[facility])  Referral receive for hospice services at home.  Met with patient and his wife Bethena Roys at the bedside.  Bethena Roys reports he was at a SNF for rehab but the plan is to dc to their home with hospice support.  DME discussed, he needs a hospital bed. [facility] will order. Wife states she cannot accept the bed today due to needing to arrange help to move furniture around to accommodate the hospital bed. She requests that it be delivered on Thursday.  He will need ambulance transport arranged once he is ready to discharge as his wife is unable to transport him POV.  Please send completed DNR with him.  Thank you, Venia Carbon RN, BSN, [facility] Liaison

## 2021-04-24 NOTE — Progress Notes (Signed)
   Heart Failure Nurse Navigator Note  Met with patient and wife today.  He was sitting up in chair, taking a break eating lunch.  Discussed with them that he is  going to be discharged home with hospice.  They are awaiting delivery of the hospital bed.  Discussed the importance of daily weights if he is able to stand  and  continuing to monitor for weight gain or change in symptoms.  Wife questions if he needs post hospital appointment at the heart failure outpatient clinic.  Instructed even though he is going to be followed by hospice that Darylene Price NP would be at to see him if they wanted to continue with that.  Wife states that transportation would be a problem and wondered if Ms. Jackelyn Hoehn would be willing to do video visit.  I told her that I would discuss it with Ms. [facility] and would let her know.  They had no further questions at this time.  Pricilla Riffle RN [facility]

## 2021-04-24 NOTE — Progress Notes (Signed)
Palliative:  HPI: 75 y.o. male  with past medical history of stroke, HFpEF, atrial fibrillation on Eliquis, CAD, hypertension, COPD, CKD stage 3b, peripheral vascular disease, diabetes, GERD, prostate cancer admitted from rehab facility on 04/21/2021 with respiratory distress requiring transient BiPAP due to CHF exacerbation. Noted outpatient palliative following via [facility].   I met today with [name] and wife, [name]. [name] appears with improved breathing today and less winded during conversation. He is in good spirits. We discuss his current health situation and where to go from here. We discussed his health decline and the limited progress he has made in rehab. We discussed further the option to go home and having assistance from hospice in the home to keep him comfortable and happy. [name] shares that her mother had hospice in the home and they were a wonderful help. Her father was at hospice facility. They have had experience and know what to expect from hospice care. Although they are hopeful for more time and improved management of fluid status they understand that his disease continues to progress and take a toll on him. We also discussed [name] desire to remain home and utilize medication to optimize and when these don't work medication to keep him comfortable in the home vs return to the hospital. [name] understands but [name] needs more time to consider this. They both feel that he would benefit from hospice as he would like to spend his time at home with his wife and 2 cats.   All questions/concerns addressed. Emotional support provided.   Exam: Alert, oriented. Slow to respond at times. No distress. Breathing regular, unlabored with oxygen. Generalized weakness and frailty. Good appetite.   Plan: - They have made decision to return home with support of hospice.  - Continue to optimize fluid status and diuretic regimen prior to discharge home with hospice.   [place],  NP Palliative Medicine Team Pager [phone] (Please see [email or web address] for schedule) Team Phone [phone]    Greater than 50%  of this time was spent counseling and coordinating care related to the above assessment and plan

## 2021-04-24 NOTE — Progress Notes (Signed)
[facility] Progress Note  Patient: [name]    RSW:[id number]  DOA: 04/21/2021     Date of Service: the patient was seen and examined on 04/24/2021  Chief Complaint  Patient presents with   Respiratory Distress   Brief hospital course: Past medical history of CKD 3B HFpEF, type II DM, CAD SP CABG, PAF, COPD, HLD, CVA, PVD.  Presents with complaints of shortness of breath with acute on chronic diastolic CHF requiring BiPAP. Currently respiratory status is improved but still not back to baseline. Cardiology consulted Currently plan is continue aggressive diuresis    Assessment and Plan: 1.  Acute hypoxic respiratory failure, present on admission Acute on chronic combined systolic and diastolic CHF Chronic atrial fibrillation Frequent NSVT's and PVCs History of COPD Hypertensive urgency Presents from SNF.  SNF staff found the patient with respiratory rate in 30s and 40s, room air sats of 70s with improvement to 88% on CPAP. On initial evaluation blood pressure was also 200/130 Chest x-ray shows significant vascular congestion. Treated aggressively with BiPAP, IV diuresis, IV steroids, IV antibiotics. Currently improving.  Back on his 3 L of oxygen. Continue IV Lasix 40 q12h Continue Imdur. Patient does have bradycardia but also has significant PVCs and NSVT's. Discussed with cardiology okay to add low-dose Lopressor right now. EKG unremarkable. Repeat chest x-ray on 6/21 shows improvement in respiratory status. Prognosis remains guarded Due to CKD  unable to add ACE inhibitor or ARB. Echocardiogram on January 2022 shows 50 to 55% EF improved from 35 to 40% in February 2021. We will repeat another echocardiogram due to frequent PVCs and NSVT's. Does not appear to have any COPD exacerbation.  We will continue with nebulization but will not continue antibiotics or steroids.   2.  Chronic A. fib Rate controlled without medication.  Currently actually has  bradycardia. Continue Eliquis.   3.  Type II DM, uncontrolled with hyperglycemia with peripheral neuropathy without long-term insulin use Continue Neurontin. Currently on sliding scale insulin. Holding home regimen.   4.  HLD Continue statin   5.  CKD 3B Baseline serum creatinine around 2.0. Currently serum creatinine 1.6-1.8.  Monitor with diuresis.   6.  History of CAD, has history of COPD, elevated troponin Demand ischemia. Does not appear to have ACS. Monitor.  Cardiology consulted.   7.  Goals of care conversation. Palliative care consulted.  DNR.   8.  Anxiety. Mood disorder. Continue home regimen.   9.  PVD. ABI shows mildly abnormal resting ABI on the right.  Depending on palliative care consultation will refer to vascular surgery outpatient.  Patient remains asymptomatic.   Body mass index is 25.48 kg/m.  Nutrition Problem: Severe Malnutrition Etiology: chronic illness (COPD) Interventions:    Pressure Injury 04/04/21 Sacrum Medial Stage 2 -  Partial thickness loss of dermis presenting as a shallow open injury with a red, pink wound bed without slough. (Active)  04/04/21 1930  Location: Sacrum  Location Orientation: Medial  Staging: Stage 2 -  Partial thickness loss of dermis presenting as a shallow open injury with a red, pink wound bed without slough.  Wound Description (Comments):   Present on Admission: Yes     Diet: Cardiac diet DVT Prophylaxis: Therapeutic Anticoagulation with Eliquis    Advance goals of care discussion: DNR  Family Communication: family was present at bedside, at the time of interview.  HomeThe pt provided permission to discuss medical plan with the family. Opportunity was given to ask question and all questions were  answered satisfactorily.   Disposition:  Pt is from SNF, admitted with CHF, still has CHF, poor prognosis palliative care was consulted, patient and family agreed for hospice care. Discharge to home with hospice  care, when all documents will be delivered at home most likely tomorrow a.m.  Subjective: No active overnight issues, patient is still having shortness of breath, denied any chest pain or palpitations, no any abdominal pain.  Physical Exam: General:  alert oriented to time, place, and person.  Appear in moderate distress, affect appropriate Eyes: PERRLA ENT: Oral Mucosa Clear, dry  Neck: no JVD,  Cardiovascular: S1 and S2 Present, no Murmur,  Respiratory: increased respiratory effort, Bilateral Air entry equal and Decreased, bibasilar crackles, no wheezes Abdomen: Bowel Sound present, Soft and no tenderness,  Skin: no rashes Extremities: no Pedal edema, no calf tenderness Neurologic: without any new focal findings Gait not checked due to patient safety concerns  Vitals:   04/23/21 1959 04/23/21 2022 04/24/21 0622 04/24/21 0807  BP: (!) 184/61  (!) 155/49 (!) 155/57  Pulse: 70  (!) 51 74  Resp: 15  18 18   Temp: 97.7 F (36.5 C)  98 F (36.7 C) 98 F (36.7 C)  TempSrc:      SpO2: 100% 99% 100% 95%  Weight:        Intake/Output Summary (Last 24 hours) at 04/24/2021 1406 Last data filed at 04/24/2021 1353 Gross per 24 hour  Intake 253.85 ml  Output 2100 ml  Net -1846.15 ml   Filed Weights   04/23/21 1436  Weight: 71.6 kg    Data Reviewed: I have personally reviewed and interpreted daily labs, tele strips, imagings as discussed above. I reviewed all nursing notes, pharmacy notes, vitals, pertinent old records I have discussed plan of care as described above with RN and patient/family.  CBC: Recent Labs  Lab 04/21/21 1836 04/22/21 0354 04/24/21 0456  WBC 15.5* 5.7 9.3  NEUTROABS 9.6*  --   --   HGB 11.1* 9.8* 9.6*  HCT 37.6* 32.0* 31.1*  MCV 88.3 85.6 85.2  PLT 379 267 458   Basic Metabolic Panel: Recent Labs  Lab 04/21/21 1836 04/22/21 0354 04/23/21 0453 04/23/21 0820 04/23/21 1632 04/24/21 0456  NA 135 136  --  136 135 137  K 4.3 4.6  --  4.3 3.9 4.0   CL 99 101  --  97* 95* 96*  CO2 29 27  --  31 31 34*  GLUCOSE 257* 248*  --  150* 264* 77  BUN 23 30*  --  37* 39* 38*  CREATININE 1.61* 1.80*  --  1.52* 1.55* 1.53*  CALCIUM 8.5* 8.2*  --  8.9 8.7* 8.6*  MG  --   --  2.0 2.2 1.9 2.4    Studies: DG Chest Port 1 View  Result Date: 04/23/2021 CLINICAL DATA:  Shortness of breath. EXAM: PORTABLE CHEST 1 VIEW COMPARISON:  Radiograph 2 days ago. FINDINGS: Post median sternotomy and CABG. Similar cardiomegaly. Improving interstitial opacities likely improving pulmonary edema. There are small bilateral pleural effusions, with increase on the right. No pneumothorax. Stable osseous structures. IMPRESSION: Improving interstitial opacities likely improving pulmonary edema. Small bilateral pleural effusions, with increase on the right. Stable cardiomegaly. Electronically Signed   By: Keith Rake M.D.   On: 04/23/2021 18:45   ECHOCARDIOGRAM LIMITED  Result Date: 04/24/2021    ECHOCARDIOGRAM LIMITED REPORT   Patient Name:   MARCELLUS PULLIAM Date of Exam: 04/23/2021 Medical Rec #:  [id number]  Height:  66.0 in Accession #:    [id number] Weight:       157.8 lb Date of Birth:  [birth year]  BSA:          1.808 m Patient Age:    75 years   BP:           167/49 mmHg Patient Gender: M          HR:           75 bpm. Exam Location:  [facility] Procedure: 2D Echo, Intracardiac Opacification Agent, Limited Echo and Limited            Color Doppler Indications:     G31.51 Acute diastolic CHF  History:         Patient has prior history of Echocardiogram examinations. Risk                  Factors:Diabetes, Hypertension and Dyslipidemia. Atrial                  fibrillation. Congestive heart failure. COPD. Coronary artery                  disease. Peripheral vascular disease. Pneumonia.  Sonographer:     Wilford Sports Rodgers-Jones Referring Phys:  [id number] [place] Diagnosing Phys: Serafina Royals MD IMPRESSIONS  1. The left ventricle demonstrates regional wall motion abnormalities  (see scoring diagram/findings for description). Left ventricular diastolic parameters were normal.  2. Right ventricular systolic function is normal. The right ventricular size is normal.  3. Left atrial size was mildly dilated.  4. The mitral valve is normal in structure. Mild to moderate mitral valve regurgitation.  5. The aortic valve is normal in structure. Aortic valve regurgitation is mild. FINDINGS  Left Ventricle: The left ventricle demonstrates regional wall motion abnormalities. Severe hypokinesis of the left ventricular, apical inferolateral wall. Definity contrast agent was given IV to delineate the left ventricular endocardial borders. There is no left ventricular hypertrophy. Left ventricular diastolic parameters were normal. Right Ventricle: The right ventricular size is normal. No increase in right ventricular wall thickness. Right ventricular systolic function is normal. Left Atrium: Left atrial size was mildly dilated. Right Atrium: Right atrial size was normal in size. Pericardium: There is no evidence of pericardial effusion. Mitral Valve: The mitral valve is normal in structure. Mild to moderate mitral valve regurgitation. Tricuspid Valve: The tricuspid valve is normal in structure. Tricuspid valve regurgitation is mild. Aortic Valve: The aortic valve is normal in structure. Aortic valve regurgitation is mild. Aortic regurgitation PHT measures 267 msec. Aortic valve mean gradient measures 12.5 mmHg. Aortic valve peak gradient measures 20.8 mmHg. Pulmonic Valve: The pulmonic valve was normal in structure. Pulmonic valve regurgitation is not visualized. Aorta: The aortic root and ascending aorta are structurally normal, with no evidence of dilitation. IAS/Shunts: No atrial level shunt detected by color flow Doppler. LEFT VENTRICLE PLAX 2D LVIDd:         5.40 cm LVIDs:         4.20 cm LV PW:         1.10 cm LV IVS:        1.10 cm  IVC IVC diam: 1.90 cm LEFT ATRIUM         Index LA diam:    4.70 cm  2.60 cm/m  AORTIC VALVE AV Vmax:      228.00 cm/s AV Vmean:     165.000 cm/s AV VTI:       0.468 m AV  Peak Grad: 20.8 mmHg AV Mean Grad: 12.5 mmHg AI PHT:       267 msec  AORTA Ao Root diam: 3.60 cm MV E velocity: 136.00 cm/s Serafina Royals MD Electronically signed by Serafina Royals MD Signature Date/Time: 04/24/2021/8:59:18 AM    Final     Scheduled Meds:  apixaban  2.5 mg Oral Q12H   vitamin C  250 mg Oral BID   busPIRone  5 mg Oral BID   clopidogrel  75 mg Oral Daily   ferrous sulfate  325 mg Oral BID WC   furosemide  40 mg Intravenous Q12H   gabapentin  100 mg Oral TID   guaiFENesin  600 mg Oral BID   hydrALAZINE  50 mg Oral Q8H   hydrocortisone   Rectal BID   insulin aspart  0-15 Units Subcutaneous TID AC & HS   insulin glargine  15 Units Subcutaneous Q2200   ipratropium  0.5 mg Nebulization TID   isosorbide mononitrate  30 mg Oral Daily   levalbuterol  0.63 mg Nebulization TID   metoprolol tartrate  12.5 mg Oral BID   multivitamin-lutein  1 capsule Oral Daily   nicotine  21 mg Transdermal Daily   pantoprazole  40 mg Oral Daily   polyethylene glycol  17 g Oral Daily   Ensure Max Protein  11 oz Oral BID   simvastatin  20 mg Oral QPM   traZODone  50 mg Oral QHS   Continuous Infusions:  sodium chloride Stopped (04/23/21 2311)   PRN Meds: sodium chloride, acetaminophen **OR** acetaminophen, ALPRAZolam, bisacodyl, lactase, ondansetron **OR** ondansetron (ZOFRAN) IV, polyethylene glycol  Time spent: 35 minutes  Author: Val Riles. MD [facility] 04/24/2021 2:06 PM  To reach On-call, see care teams to locate the attending and reach out to them via [email or web address]. If 7PM-7AM, please contact night-coverage If you still have difficulty reaching the attending provider, please page the [facility] (Director on Call) for [facility] on amion for assistance.

## 2021-04-25 LAB — GLUCOSE, CAPILLARY
Glucose-Capillary: 130 mg/dL — ABNORMAL HIGH (ref 70–99)
Glucose-Capillary: 248 mg/dL — ABNORMAL HIGH (ref 70–99)
Glucose-Capillary: 239 mg/dL — ABNORMAL HIGH (ref 70–99)

## 2021-04-25 MED ORDER — METOPROLOL TARTRATE 25 MG PO TABS: 12.5000 mg | ORAL_TABLET | Freq: Every day | ORAL | 0 refills | Status: AC

## 2021-04-25 MED ORDER — FUROSEMIDE 40 MG PO TABS
40.0000 mg | ORAL_TABLET | Freq: Every day | ORAL | Status: DC
  Administered 2021-04-25: 40 mg via ORAL
  Filled 2021-04-25: qty 1

## 2021-04-25 MED ORDER — HYDRALAZINE HCL 50 MG PO TABS: 50.0000 mg | ORAL_TABLET | Freq: Three times a day (TID) | ORAL | 0 refills | Status: AC

## 2021-04-25 MED ORDER — ISOSORBIDE MONONITRATE ER 30 MG PO TB24: 30.0000 mg | ORAL_TABLET | Freq: Every day | ORAL | 0 refills | Status: AC

## 2021-04-25 MED ORDER — TORSEMIDE 20 MG PO TABS: 20.0000 mg | ORAL_TABLET | Freq: Every day | ORAL | 0 refills | Status: AC

## 2021-04-25 NOTE — Progress Notes (Signed)
[facility] Encounter Note  Patient: [name] / Admit Date: 04/21/2021 / Date of Encounter: 04/25/2021, 9:03 AM   Subjective: Patient overall is continuing to improve and feeling better with no evidence of significant anginal symptoms or congestive heart failure at this time.  Overall he has had good heart rate control with beta-blocker treating his significant issues.  Telemetry continues to show atrial fibrillation with left bundle branch block at 58 bpm.  Urine output still reasonable  Echocardiogram mild LV systolic dysfunction with ejection fraction of 40%  Review of Systems: Positive for: Shortness of breath Negative for: Vision change, hearing change, syncope, dizziness, nausea, vomiting,diarrhea, bloody stool, stomach pain, cough, congestion, diaphoresis, urinary frequency, urinary pain,skin lesions, skin rashes Others previously listed  Objective: Telemetry: Atrial fibrillation with bundle branch block at 52 bpm Physical Exam: Blood pressure (!) 125/49, pulse 65, temperature 98.1 F (36.7 C), resp. rate 19, weight 68.2 kg, SpO2 100 %. Body mass index is 24.27 kg/m. General: Well developed, well nourished, in no acute distress. Head: Normocephalic, atraumatic, sclera non-icteric, no xanthomas, nares are without discharge. Neck: No apparent masses Lungs: Normal respirations with few wheezes, no rhonchi, no rales , basilar crackles   Heart: Regular rate and rhythm, normal S1 S2, no murmur, no rub, no gallop, PMI is normal size and placement, carotid upstroke normal without bruit, jugular venous pressure normal Abdomen: Soft, non-tender, non-distended with normoactive bowel sounds. No hepatosplenomegaly. Abdominal aorta is normal size without bruit Extremities: Trace edema, no clubbing, no cyanosis, no ulcers,  Peripheral: 2+ radial, 2+ femoral, 2+ dorsal pedal pulses Neuro: Alert and oriented. Moves all extremities spontaneously. Psych:  Responds to  questions appropriately with a normal affect.   Intake/Output Summary (Last 24 hours) at 04/25/2021 0903 Last data filed at 04/25/2021 4132 Gross per 24 hour  Intake 340 ml  Output 1400 ml  Net -1060 ml     Inpatient Medications:   apixaban  2.5 mg Oral Q12H   vitamin C  250 mg Oral BID   busPIRone  5 mg Oral BID   clopidogrel  75 mg Oral Daily   ferrous sulfate  325 mg Oral BID WC   furosemide  40 mg Intravenous Q12H   gabapentin  100 mg Oral TID   guaiFENesin  600 mg Oral BID   hydrALAZINE  50 mg Oral Q8H   hydrocortisone   Rectal BID   insulin aspart  0-15 Units Subcutaneous TID AC & HS   insulin glargine  15 Units Subcutaneous Q2200   ipratropium  0.5 mg Nebulization TID   isosorbide mononitrate  30 mg Oral Daily   levalbuterol  0.63 mg Nebulization TID   metoprolol tartrate  12.5 mg Oral Daily   multivitamin-lutein  1 capsule Oral Daily   nicotine  21 mg Transdermal Daily   pantoprazole  40 mg Oral Daily   polyethylene glycol  17 g Oral Daily   Ensure Max Protein  11 oz Oral BID   simvastatin  20 mg Oral QPM   traZODone  50 mg Oral QHS   Infusions:   sodium chloride Stopped (04/23/21 2311)    Labs: Recent Labs    04/23/21 1632 04/24/21 0456  NA 135 137  K 3.9 4.0  CL 95* 96*  CO2 31 34*  GLUCOSE 264* 77  BUN 39* 38*  CREATININE 1.55* 1.53*  CALCIUM 8.7* 8.6*  MG 1.9 2.4    No results for input(s): AST, ALT, ALKPHOS, BILITOT, PROT, ALBUMIN in the  last 72 hours. Recent Labs    04/24/21 0456  WBC 9.3  HGB 9.6*  HCT 31.1*  MCV 85.2  PLT 297    No results for input(s): CKTOTAL, CKMB, TROPONINI in the last 72 hours. Invalid input(s): POCBNP No results for input(s): HGBA1C in the last 72 hours.   Weights: Filed Weights   04/23/21 1436 04/25/21 0449  Weight: 71.6 kg 68.2 kg     Radiology/Studies:  DG Chest 1 View  Result Date: 04/02/2021 CLINICAL DATA:  Confusion, weakness and elevated white blood cell count. EXAM: CHEST  1 VIEW COMPARISON:   Single-view of the chest 02/21/2021 and 03/01/2021. CT chest 02/02/2021. FINDINGS: The lungs are emphysematous. There is left basilar atelectasis. The right lung is clear. Cardiomegaly. The patient is status post CABG. Aortic atherosclerosis. No pneumothorax or pleural fluid. IMPRESSION: No acute disease. Left basilar atelectasis. Cardiomegaly. Aortic Atherosclerosis (ICD10-I70.0) and Emphysema (ICD10-J43.9). Electronically Signed   By: Inge Rise M.D.   On: 04/02/2021 17:04   CT Head Wo Contrast  Result Date: 04/02/2021 CLINICAL DATA:  Generalized weakness and altered mental status EXAM: CT HEAD WITHOUT CONTRAST TECHNIQUE: Contiguous axial images were obtained from the base of the skull through the vertex without intravenous contrast. COMPARISON:  02/19/2021 FINDINGS: Brain: No evidence of acute infarction, hemorrhage, hydrocephalus, extra-axial collection or mass lesion/mass effect. Mild atrophic and chronic white matter ischemic changes are noted. Vascular: No hyperdense vessel or unexpected calcification. Skull: Normal. Negative for fracture or focal lesion. Sinuses/Orbits: No acute finding. Other: None. IMPRESSION: Chronic atrophic and ischemic changes without acute abnormality. Electronically Signed   By: Inez Catalina M.D.   On: 04/02/2021 16:46   US ARTERIAL ABI (SCREENING LOWER EXTREMITY)  Result Date: 04/23/2021 CLINICAL DATA:  75 year old male with a history of cardiovascular risk factors EXAM: NONINVASIVE PHYSIOLOGIC VASCULAR STUDY OF BILATERAL LOWER EXTREMITIES TECHNIQUE: Evaluation of both lower extremities was performed at rest, including calculation of ankle-brachial indices, multiple segmental pressure evaluation, segmental Doppler and segmental pulse volume recording. COMPARISON:  None. FINDINGS: Right ABI:  0.55 Left ABI:  0.94 Right Lower Extremity: Segmental Doppler at the right ankle demonstrates monophasic waveforms Left Lower Extremity: Segmental Doppler at the left ankle  demonstrates monophasic posterior tibial artery and biphasic dorsalis pedis IMPRESSION: Right: Resting ABI in the moderate range arterial occlusive disease. Segmental exam at the ankle demonstrates monophasic waveforms, indicative of more proximal occlusive disease. Left: Resting ABI within normal limits. Segmental exam demonstrates evidence of posterior tibial arterial occlusive disease. Signed, Dulcy Fanny. Dellia Nims, RPVI [facility] Electronically Signed   By: Corrie Mckusick D.O.   On: 04/23/2021 11:54   DG Chest Port 1 View  Result Date: 04/23/2021 CLINICAL DATA:  Shortness of breath. EXAM: PORTABLE CHEST 1 VIEW COMPARISON:  Radiograph 2 days ago. FINDINGS: Post median sternotomy and CABG. Similar cardiomegaly. Improving interstitial opacities likely improving pulmonary edema. There are small bilateral pleural effusions, with increase on the right. No pneumothorax. Stable osseous structures. IMPRESSION: Improving interstitial opacities likely improving pulmonary edema. Small bilateral pleural effusions, with increase on the right. Stable cardiomegaly. Electronically Signed   By: Keith Rake M.D.   On: 04/23/2021 18:45   DG Chest Port 1 View  Result Date: 04/21/2021 CLINICAL DATA:  Respiratory distress EXAM: PORTABLE CHEST 1 VIEW COMPARISON:  04/07/2021 FINDINGS: Post CABG changes. Stable cardiomegaly. Pulmonary vascular congestion with diffuse alveolar and interstitial opacities bilaterally. Probable small bilateral pleural effusions. No pneumothorax. IMPRESSION: Findings suggestive of CHF with pulmonary  edema. A superimposed infectious process would be difficult to exclude. Electronically Signed   By: Davina Poke D.O.   On: 04/21/2021 19:07   DG Chest Port 1 View  Result Date: 04/07/2021 CLINICAL DATA:  Shortness of breath EXAM: PORTABLE CHEST 1 VIEW COMPARISON:  Apr 02, 2021 FINDINGS: There is again cardiomegaly. There is pulmonary  venous hypertension. There is extensive interstitial thickening and airspace opacity, likely representing interstitial and alveolar pulmonary edema. There is a left pleural effusion. Patient is status post coronary artery bypass grafting. There is aortic atherosclerosis. No bone lesions. IMPRESSION: Cardiomegaly with pulmonary vascular congestion. Suspected interstitial alveolar edema with left pleural effusion. The overall appearance is consistent with congestive heart failure. It should be noted that patchy superimposed pneumonia cannot be excluded in this circumstance. Status post coronary artery bypass grafting. Aortic Atherosclerosis (ICD10-I70.0). Electronically Signed   By: Lowella Grip III M.D.   On: 04/07/2021 10:32   ECHOCARDIOGRAM LIMITED  Result Date: 04/24/2021    ECHOCARDIOGRAM LIMITED REPORT   Patient Name:   Elma Conley Date of Exam: 04/23/2021 Medical Rec #:  [id number]  Height:       66.0 in Accession #:    [id number] Weight:       157.8 lb Date of Birth:  [birth year]  BSA:          1.808 m Patient Age:    74 years   BP:           167/49 mmHg Patient Gender: M          HR:           75 bpm. Exam Location:  [facility] Procedure: 2D Echo, Intracardiac Opacification Agent, Limited Echo and Limited            Color Doppler Indications:     W41.32 Acute diastolic CHF  History:         Patient has prior history of Echocardiogram examinations. Risk                  Factors:Diabetes, Hypertension and Dyslipidemia. Atrial                  fibrillation. Congestive heart failure. COPD. Coronary artery                  disease. Peripheral vascular disease. Pneumonia.  Sonographer:     Wilford Sports Rodgers-Jones Referring Phys:  [id number] [place] Diagnosing Phys: Serafina Royals MD IMPRESSIONS  1. The left ventricle demonstrates regional wall motion abnormalities (see scoring diagram/findings for description). Left ventricular diastolic parameters were normal.  2. Right ventricular systolic function is normal.  The right ventricular size is normal.  3. Left atrial size was mildly dilated.  4. The mitral valve is normal in structure. Mild to moderate mitral valve regurgitation.  5. The aortic valve is normal in structure. Aortic valve regurgitation is mild. FINDINGS  Left Ventricle: The left ventricle demonstrates regional wall motion abnormalities. Severe hypokinesis of the left ventricular, apical inferolateral wall. Definity contrast agent was given IV to delineate the left ventricular endocardial borders. There is no left ventricular hypertrophy. Left ventricular diastolic parameters were normal. Right Ventricle: The right ventricular size is normal. No increase in right ventricular wall thickness. Right ventricular systolic function is normal. Left Atrium: Left atrial size was mildly dilated. Right Atrium: Right atrial size was normal in size. Pericardium: There is no evidence of pericardial effusion. Mitral Valve: The mitral valve is normal in structure. Mild to moderate  mitral valve regurgitation. Tricuspid Valve: The tricuspid valve is normal in structure. Tricuspid valve regurgitation is mild. Aortic Valve: The aortic valve is normal in structure. Aortic valve regurgitation is mild. Aortic regurgitation PHT measures 267 msec. Aortic valve mean gradient measures 12.5 mmHg. Aortic valve peak gradient measures 20.8 mmHg. Pulmonic Valve: The pulmonic valve was normal in structure. Pulmonic valve regurgitation is not visualized. Aorta: The aortic root and ascending aorta are structurally normal, with no evidence of dilitation. IAS/Shunts: No atrial level shunt detected by color flow Doppler. LEFT VENTRICLE PLAX 2D LVIDd:         5.40 cm LVIDs:         4.20 cm LV PW:         1.10 cm LV IVS:        1.10 cm  IVC IVC diam: 1.90 cm LEFT ATRIUM         Index LA diam:    4.70 cm 2.60 cm/m  AORTIC VALVE AV Vmax:      228.00 cm/s AV Vmean:     165.000 cm/s AV VTI:       0.468 m AV Peak Grad: 20.8 mmHg AV Mean Grad: 12.5 mmHg AI  PHT:       267 msec  AORTA Ao Root diam: 3.60 cm MV E velocity: 136.00 cm/s Serafina Royals MD Electronically signed by Serafina Royals MD Signature Date/Time: 04/24/2021/8:59:18 AM    Final      Assessment and Recommendation  75 y.o. male with known coronary disease status post coronary bypass graft diabetes hypertension hyperlipidemia previous cerebrovascular accident paroxysmal nonvalvular atrial fibrillation with acute on chronic systolic dysfunction congestive heart failure and pulmonary edema without evidence of acute coronary syndrome 1.  Change Lasix to 40 mg orally each day for discharged home 2.  Continue anticoagulation for further risk reduction and stroke with atrial fibrillation without change 3.  Continue supplemental oxygen for hypoxia and shortness of breath 4.  No need for additional medication management of her heart rate control at this time 5.  No further cardiac diagnosis necessary at this time 6.  Begin ambulation and follow-up for improvements of symptoms and begin cardiac rehabilitation 7.  If patient ambulating well with no further significant symptoms possible discharge to home on oral Lasix at 40 mg each day with follow-up next week for further adjustments of medication management Signed, Serafina Royals M.D. FACC

## 2021-04-25 NOTE — Progress Notes (Signed)
Physical Therapy Treatment Patient Details Name: [name] MRN: [id number] DOB: Mar 16, 1946 Today's Date: 04/25/2021    History of Present Illness 75 y.o. Caucasian male with medical history significant for atrial fibrillation on Eliquis, COPD, CHF, type 2 diabetes mellitus, GERD, dyslipidemia, hypertension, CVA and peripheral vascular disease, who presented to the emergency room with acute onset over the last hour prior to arrival to the ER at his rehab facility.  EMS reported a run of ventricular tachycardia and significant hypoxia with increased work of breathing.    PT Comments    Pt received seated in recliner agreeable to PT. On 4L/min via [place]. Resting HR: 50-58 BPM and 98-99% SPO2. Performed seated marches prior to mobilization. Pt still required modA+1 for STS to RW. Amb 13' with standing use of urinal halfway through walking bout. Pt able to stand during whole bout with SUE on RW and PT total assist to hold urinal in place so pt can void safely. Pt desat to 87% during walking bout and educated on performing PSB in sitting EOB. After 1 min SPO2 returned to > 90%. ModA+1 to return to standing and returned to recliner. Good safety awareness and sequencing performed with RW and UE use returning to seated position. Max HR recorded at 85 BPM and SPO2 from 88% to 902% after further seated rest. Pt still ambulating at decreased walking speed but this may be line ambulation speed for pt prior to admission. Current recs remain appropriate at this time. PT will continue to follow pt during current admission as available.     Follow Up Recommendations  Supervision/Assistance - 24 hour;Other (comment)     Equipment Recommendations       Recommendations for Other Services       Precautions / Restrictions Precautions Precautions: Fall Restrictions Weight Bearing Restrictions: No    Mobility  Bed Mobility               General bed mobility comments: Received and returned to recliner     Transfers Overall transfer level: Needs assistance Equipment used: Rolling walker (2 wheeled) Transfers: Sit to/from Stand Sit to Stand: Mod assist            Ambulation/Gait Ambulation/Gait assistance: Min guard Gait Distance (Feet): 26 Feet Assistive device: Rolling walker (2 wheeled) Gait Pattern/deviations: Step-to pattern;Trunk flexed Gait velocity: dec       Stairs             Wheelchair Mobility    Modified Rankin (Stroke Patients Only)       Balance Overall balance assessment: Needs assistance Sitting-balance support: Bilateral upper extremity supported;Feet supported Sitting balance-Leahy Scale: Poor     Standing balance support: Bilateral upper extremity supported Standing balance-Leahy Scale: Poor                              Cognition Arousal/Alertness: Awake/alert Behavior During Therapy: WFL for tasks assessed/performed Overall Cognitive Status: Within Functional Limits for tasks assessed                                 General Comments: More conversational today.      Exercises General Exercises - Lower Extremity Hip Flexion/Marching: AROM;Strengthening;Both;10 reps;Seated Other Exercises Other Exercises: amb in room with RW. STS and stand <> sit t/f.    General Comments        Pertinent Vitals/Pain Pain Assessment: No/denies  pain Pain Score: 0-No pain    Home Living                      Prior Function            PT Goals (current goals can now be found in the care plan section) Acute Rehab PT Goals Patient Stated Goal: Get stronger, return home PT Goal Formulation: With patient/family Time For Goal Achievement: 05/08/21 Potential to Achieve Goals: Fair Progress towards PT goals: Progressing toward goals    Frequency    Min 2X/week      PT Plan Current plan remains appropriate    Co-evaluation              AM-PAC PT "6 Clicks" Mobility   Outcome Measure  Help  needed turning from your back to your side while in a flat bed without using bedrails?: A Lot Help needed moving from lying on your back to sitting on the side of a flat bed without using bedrails?: A Lot Help needed moving to and from a bed to a chair (including a wheelchair)?: A Lot Help needed standing up from a chair using your arms (e.g., wheelchair or bedside chair)?: A Lot Help needed to walk in hospital room?: A Little Help needed climbing 3-5 steps with a railing? : A Lot 6 Click Score: 13    End of Session Equipment Utilized During Treatment: Gait belt;Oxygen Activity Tolerance: Patient tolerated treatment well;Patient limited by fatigue Patient left: in bed;with call bell/phone within reach Nurse Communication: Mobility status PT Visit Diagnosis: Unsteadiness on feet (R26.81);Other abnormalities of gait and mobility (R26.89);Repeated falls (R29.6);Muscle weakness (generalized) (M62.81);History of falling (Z91.81);Difficulty in walking, not elsewhere classified (R26.2)     Time: 5916-3846 PT Time Calculation (min) (ACUTE ONLY): 29 min  Charges:  $Therapeutic Activity: 23-37 mins                    [name] M. Fairly IV, PT, DPT Physical Therapist- [facility]  04/25/2021, 12:12 PM

## 2021-04-25 NOTE — Care Management Important Message (Addendum)
Important Message  Patient Details  Name: [name] MRN: [id number] Date of Birth: [birth year]   Medicare Important Message Given:  Yes   Dannette Barbara 04/25/2021, 12:28 PM

## 2021-04-25 NOTE — TOC Transition Note (Signed)
Transition of Care [facility]) - CM/SW Discharge Note   Patient Details  Name: [name] MRN: [id number] Date of Birth: Oct 19, 1946  Transition of Care [facility]) CM/SW Contact:  Kerin Salen, RN Phone Number: 04/25/2021, 1:31 PM   Clinical Narrative:  Patient to discharge home with Hospice. Wife says all equipment ordered arrived and waiting for patient. [facility] will transport at 4:30pm, wife and Nurse notified. TOC barriers resolved.     Final next level of care: Home w Hospice Care Barriers to Discharge: Barriers Resolved   Patient Goals and CMS Choice Patient states their goals for this hospitalization and ongoing recovery are:: Home with hospice.      Discharge Placement                Patient to be transferred to facility by: [facility] Name of family member notified: Wife, [name] Patient and family notified of of transfer: 04/25/21  Discharge Plan and Services                  DME Agency: Other - Comment (Hospice arranged all DME needed for home, wife says everything was delivered.)                  Social Determinants of Health (SDOH) Interventions     Readmission Risk Interventions Readmission Risk Prevention Plan 04/05/2021 02/04/2021 10/28/2020  Transportation Screening Complete Complete Complete  PCP or Specialist Appt within 3-5 Days - - -  HRI or [place] - - -  Medication Review ([place]) Complete Complete Complete  PCP or Specialist appointment within 3-5 days of discharge Complete Complete Complete  HRI or Home Care Consult Complete Complete Complete  SW Recovery Care/Counseling Consult Complete Complete -  Palliative Care Screening Not Applicable Not Applicable Not Applicable  Skilled Nursing Facility Complete Not Applicable Not Applicable  Some recent data might be hidden

## 2021-04-25 NOTE — Progress Notes (Signed)
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 Wife had not heard from the DME company with an exact time yet. Advised her once it is set up, to call [place] and we will update TOC manager at that time so ambulance transport can be arranged home.  Updated hospital team via epic chat.  Please send completed DNR with patient.  Thank you, Venia Carbon RN, BSN, [facility] Liaison

## 2021-04-25 NOTE — Progress Notes (Addendum)
[facility])  All necessary DME is in the home.  Wife Bethena Roys will await his transport home (he will require ambulance transport).  Updated hospital staff via epic note that from hospices perspective, he is ready to be discharged home.   Thank you for allowing [place] to participate in this patients care.  Venia Carbon RN, BSN, [facility] Liaison

## 2021-04-25 NOTE — Discharge Summary (Signed)
[facility] Discharge Summary   Patient: [name] VQQ:[id number]  PCP: [name], MD  Date of admission: 04/21/2021   Date of discharge:  04/25/2021     Discharge Diagnoses:  Principal Problem:   Acute respiratory failure ([place]) Active Problems:   Protein-calorie malnutrition, severe   Admitted From: SNF Disposition:  Home with Hospice   Recommendations for Outpatient Follow-up:  PCP: in 1 wk Hospice  Follow up LABS/TEST:  none   Diet recommendation: Carb modified diet  Activity: The patient is advised to gradually reintroduce usual activities, as tolerated  Discharge Condition: stable  Code Status: DNR   History of present illness: As per the H and P dictated on admission Hospital Course:  Past medical history of CKD 3B HFpEF, type II DM, CAD SP CABG, PAF, COPD, HLD, CVA, PVD.  Presents with complaints of shortness of breath with acute on chronic diastolic CHF requiring BiPAP. Currently respiratory status is improved but still not back to baseline. Cardiology consulted, further management as below. Assessment and Plan: 1.  Acute hypoxic respiratory failure, present on admission Acute on chronic combined systolic and diastolic CHF Chronic atrial fibrillation. Frequent NSVT's and PVCs History of COPD. Hypertensive urgency Presents from SNF.  SNF staff found the patient with respiratory rate in 30s and 40s, room air sats of 70s with improvement to 88% on CPAP. On initial evaluation blood pressure was also 200/130 Chest x-ray shows significant vascular congestion. S/p BiPAP, IV diuresis, IV steroids, IV antibiotics. Currently improving.  Back on his 3 L of oxygen. S/p IV Lasix 40 q12h. Continue Imdur. Patient does have bradycardia but also has significant PVCs and NSVT's. Discussed with cardiology okay to add low-dose Lopressor right now. EKG unremarkable. Repeat chest x-ray on 6/21 shows improvement in respiratory status. Prognosis remains guarded Due to CKD  unable  to add ACE inhibitor or ARB. Echocardiogram on January 2022 shows 50 to 55% EF improved from 35 to 40% in February 2021. Does not appear to have any COPD exacerbation.  continue with nebulization prn,  s/p antibiotics or steroids.  Continue supplemental O2 inhalation.  Follow with PCP and hospice care # Chronic A. Fib, Rate controlled without medication.  Currently actually has bradycardia, decreased Lopressor 12 point milligrams p.o. daily, continue Eliquis. # Type II DM, uncontrolled with hyperglycemia with peripheral neuropathy without long-term insulin use. Continue Neurontin.  Resumed home regimen, monitor FSBG and follow hospice care. # HLD, Continue statin # CKD 3B, Baseline serum creatinine around 2.0. Currently serum creatinine 1.6-1.8--1.53. # History of CAD, has history of COPD, elevated troponin Demand ischemia, Does not appear to have ACS.  Cardiology was consulted and no ischemic work-up was advised. # Goals of care conversation. Palliative care consulted,  CODE STATUS DNR, family agreed with hospice care at home # Anxiety, Mood disorder, Continue home regimen. # PVD. ABI shows mildly abnormal resting ABI on the right.  Depending on palliative care consultation will refer to vascular surgery outpatient.  Patient remains asymptomatic. Body mass index is 24.27 kg/m.  Nutrition Problem: Severe Malnutrition Etiology: chronic illness (COPD) Nutrition Interventions:   Prognosis poor, patient will benefit from hospice services which has been arranged at home.  Patient will follow with PCP as well. Discussed with patient and patient's wife, they verbalized understanding and agreed with the discharge planning.  Pressure Injury 04/04/21 Sacrum Medial Stage 2 -  Partial thickness loss of dermis presenting as a shallow open injury with a red, pink wound bed without slough. (Active)  04/04/21  1930  Location: Sacrum  Location Orientation: Medial  Staging: Stage 2 -  Partial thickness loss of  dermis presenting as a shallow open injury with a red, pink wound bed without slough.  Wound Description (Comments):   Present on Admission: Yes    Patient was seen by physical therapy, who recommended Home health, and Home hospice which was arranged. On the day of the discharge the patient's vitals were stable, and no other acute medical condition were reported by patient. the patient was felt safe to be discharge at Home with Hospice service.  Consultants: cardio and palliative care Procedures: None  Discharge Exam: General: Appear in mild distress, no Rash; Oral Mucosa Clear, moist. Cardiovascular: S1 and S2 Present, no Murmur, Respiratory: normal respiratory effort, Bilateral Air entry present and bibasilar crackles crackles, no significant wheezes Abdomen: Bowel Sound present, Soft and no tenderness, no hernia Extremities: no Pedal edema, no calf tenderness, chronic venous stasis changes due to pigmentation, dry skin Neurology: alert and oriented to time, place, and person affect appropriate.  Filed Weights   04/23/21 1436 04/25/21 0449  Weight: 71.6 kg 68.2 kg   Vitals:   04/25/21 0802 04/25/21 0817  BP:  (!) 125/49  Pulse:  65  Resp:  19  Temp:  98.1 F (36.7 C)  SpO2: 95% 100%    DISCHARGE MEDICATION: Allergies as of 04/25/2021       Reactions   Lactose Intolerance (gi)         Medication List     TAKE these medications    acetaminophen 325 MG tablet Commonly known as: TYLENOL Take 2 tablets (650 mg total) by mouth every 6 (six) hours as needed for mild pain (or Fever >/= 101).   Acidophilus Tabs Take 1 tablet by mouth daily.   albuterol 108 (90 Base) MCG/ACT inhaler Commonly known as: VENTOLIN HFA Inhale 2 puffs into the lungs every 6 (six) hours as needed for wheezing or shortness of breath.   ALPRAZolam 0.5 MG tablet Commonly known as: XANAX Take 0.5 mg by mouth 3 (three) times daily as needed.   apixaban 2.5 MG Tabs tablet Commonly known as:  ELIQUIS Take 2.5 mg by mouth every 12 (twelve) hours. What changed: Another medication with the same name was removed. Continue taking this medication, and follow the directions you see here.   B Complex Caps Take 1 capsule by mouth daily.   B-complex with vitamin C tablet Take 1 tablet by mouth every morning.   bifidobacterium infantis capsule Take 1 capsule by mouth every morning.   bisacodyl 10 MG suppository Commonly known as: DULCOLAX Place 10 mg rectally daily as needed for moderate constipation.   busPIRone 5 MG tablet Commonly known as: BUSPAR Take 1 tablet by mouth 2 (two) times daily.   cholecalciferol 1000 units tablet Commonly known as: VITAMIN D Take 1,000 Units by mouth daily.   Cinnamon 500 MG capsule Take 1,000 mg by mouth 2 (two) times daily.   clopidogrel 75 MG tablet Commonly known as: PLAVIX Take 1 tablet (75 mg total) by mouth daily.   Combivent Respimat 20-100 MCG/ACT Aers respimat Generic drug: Ipratropium-Albuterol Inhale 2 puffs into the lungs 4 (four) times daily as needed.   Cranberry Concentrate 500 MG Caps Generic drug: Cranberry Take 500 mg by mouth daily.   feeding supplement (GLUCERNA SHAKE) Liqd Take 237 mLs by mouth 3 (three) times daily between meals.   ferrous sulfate 325 (65 FE) MG tablet Take 1 tablet (325 mg total) by  mouth 2 (two) times daily with a meal.   gabapentin 100 MG capsule Commonly known as: NEURONTIN Take 100 mg by mouth 2 (two) times daily.   glucose 4 GM chewable tablet Chew 1 tablet by mouth as needed for low blood sugar.   hydrALAZINE 50 MG tablet Commonly known as: APRESOLINE Take 1 tablet (50 mg total) by mouth every 8 (eight) hours.   hydrocortisone 2.5 % cream Place 1 application rectally as needed.   hydrocortisone 25 MG suppository Commonly known as: ANUSOL-HC Place 25 mg rectally in the morning, at noon, and at bedtime. Pt may administer   insulin aspart 100 UNIT/ML injection Commonly known  as: novoLOG 0-9 Units, Subcutaneous, 3 times daily with meals CBG < 70: Implement Hypoglycemia protocol/measures CBG 70 - 120: 0 units CBG 121 - 150: 1 unit CBG 151 - 200: 2 units CBG 201 - 250: 3 units CBG 251 - 300: 5 units CBG 301 - 350: 7 units CBG 351 - 400: 9 units CBG > 400: call MD   isosorbide mononitrate 30 MG 24 hr tablet Commonly known as: IMDUR Take 1 tablet (30 mg total) by mouth daily. Start taking on: April 26, 2021   lactase 3000 units tablet Commonly known as: LACTAID Take 3,000 Units by mouth 3 (three) times daily between meals as needed.   Lantus SoloStar 100 UNIT/ML Solostar Pen Generic drug: insulin glargine Inject 15 Units into the skin daily at 10 pm.   metoprolol tartrate 25 MG tablet Commonly known as: LOPRESSOR Take 0.5 tablets (12.5 mg total) by mouth daily. Start taking on: April 26, 2021   MiraLax 17 GM/SCOOP powder Generic drug: polyethylene glycol powder Take 17 g by mouth daily.   multivitamin-lutein Caps capsule Take 1 capsule by mouth daily.   nicotine 21 mg/24hr patch Commonly known as: NICODERM CQ - dosed in mg/24 hours Place 21 mg onto the skin daily.   omeprazole 20 MG capsule Commonly known as: PRILOSEC Take 20 mg by mouth daily.   senna 8.6 MG Tabs tablet Commonly known as: SENOKOT Take 1 tablet by mouth daily.   simvastatin 20 MG tablet Commonly known as: ZOCOR Take 20 mg by mouth in the morning.   Spiriva HandiHaler 18 MCG inhalation capsule Generic drug: tiotropium Place 1 capsule (18 mcg total) into inhaler and inhale daily.   torsemide 20 MG tablet Commonly known as: DEMADEX Take 1 tablet (20 mg total) by mouth daily. What changed: when to take this   traZODone 50 MG tablet Commonly known as: DESYREL Take 50 mg by mouth at bedtime.   vitamin C 500 MG tablet Commonly known as: ASCORBIC ACID Take 500 mg by mouth 2 (two) times daily.               Discharge Care Instructions  (From admission,  onward)           Start     Ordered   04/25/21 0000  Discharge wound care:        04/25/21 1147           Allergies  Allergen Reactions   Lactose Intolerance (Gi)    Discharge Instructions     Call MD for:  severe uncontrolled pain   Complete by: As directed    Call MD for:  temperature >100.4   Complete by: As directed    Diet - low sodium heart healthy   Complete by: As directed    Discharge instructions   Complete by: As directed  Continue hospice care at home and follow with PCP for further management.   Discharge wound care:   Complete by: As directed    Wet-to-dry dressing, change position every 2 hourly   Increase activity slowly   Complete by: As directed        The results of significant diagnostics from this hospitalization (including imaging, microbiology, ancillary and laboratory) are listed below for reference.    Significant Diagnostic Studies: DG Chest 1 View  Result Date: 04/02/2021 CLINICAL DATA:  Confusion, weakness and elevated white blood cell count. EXAM: CHEST  1 VIEW COMPARISON:  Single-view of the chest 02/21/2021 and 03/01/2021. CT chest 02/02/2021. FINDINGS: The lungs are emphysematous. There is left basilar atelectasis. The right lung is clear. Cardiomegaly. The patient is status post CABG. Aortic atherosclerosis. No pneumothorax or pleural fluid. IMPRESSION: No acute disease. Left basilar atelectasis. Cardiomegaly. Aortic Atherosclerosis (ICD10-I70.0) and Emphysema (ICD10-J43.9). Electronically Signed   By: Inge Rise M.D.   On: 04/02/2021 17:04   CT Head Wo Contrast  Result Date: 04/02/2021 CLINICAL DATA:  Generalized weakness and altered mental status EXAM: CT HEAD WITHOUT CONTRAST TECHNIQUE: Contiguous axial images were obtained from the base of the skull through the vertex without intravenous contrast. COMPARISON:  02/19/2021 FINDINGS: Brain: No evidence of acute infarction, hemorrhage, hydrocephalus, extra-axial collection or  mass lesion/mass effect. Mild atrophic and chronic white matter ischemic changes are noted. Vascular: No hyperdense vessel or unexpected calcification. Skull: Normal. Negative for fracture or focal lesion. Sinuses/Orbits: No acute finding. Other: None. IMPRESSION: Chronic atrophic and ischemic changes without acute abnormality. Electronically Signed   By: Inez Catalina M.D.   On: 04/02/2021 16:46   US ARTERIAL ABI (SCREENING LOWER EXTREMITY)  Result Date: 04/23/2021 CLINICAL DATA:  75 year old male with a history of cardiovascular risk factors EXAM: NONINVASIVE PHYSIOLOGIC VASCULAR STUDY OF BILATERAL LOWER EXTREMITIES TECHNIQUE: Evaluation of both lower extremities was performed at rest, including calculation of ankle-brachial indices, multiple segmental pressure evaluation, segmental Doppler and segmental pulse volume recording. COMPARISON:  None. 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 Segmental exam at the ankle demonstrates monophasic waveforms, indicative of more proximal occlusive disease. Left: Resting ABI within normal limits. Segmental exam demonstrates evidence of posterior tibial arterial occlusive disease. Signed, Dulcy Fanny. Dellia Nims, RPVI [facility] Electronically Signed   By: Corrie Mckusick D.O.   On: 04/23/2021 11:54   DG Chest Port 1 View  Result Date: 04/23/2021 CLINICAL DATA:  Shortness of breath. EXAM: PORTABLE CHEST 1 VIEW COMPARISON:  Radiograph 2 days ago. FINDINGS: Post median sternotomy and CABG. Similar cardiomegaly. Improving interstitial opacities likely improving pulmonary edema. There are small bilateral pleural  effusions, with increase on the right. No pneumothorax. Stable osseous structures. IMPRESSION: Improving interstitial opacities likely improving pulmonary edema. Small bilateral pleural effusions, with increase on the right. Stable cardiomegaly. Electronically Signed   By: Keith Rake M.D.   On: 04/23/2021 18:45   DG Chest Port 1 View  Result Date: 04/21/2021 CLINICAL DATA:  Respiratory distress EXAM: PORTABLE CHEST 1 VIEW COMPARISON:  04/07/2021 FINDINGS: Post CABG changes. Stable cardiomegaly. Pulmonary vascular congestion with diffuse alveolar and interstitial opacities bilaterally. Probable small bilateral pleural effusions. No pneumothorax. IMPRESSION: Findings suggestive of CHF with pulmonary  edema. A superimposed infectious process would be difficult to exclude. Electronically Signed   By: Davina Poke D.O.   On: 04/21/2021 19:07   DG Chest Port 1 View  Result Date: 04/07/2021 CLINICAL DATA:  Shortness of breath EXAM: PORTABLE CHEST 1 VIEW COMPARISON:  Apr 02, 2021 FINDINGS: There is again cardiomegaly. There is pulmonary venous hypertension. There is extensive interstitial thickening and airspace opacity, likely representing interstitial and alveolar pulmonary edema. There is a left pleural effusion. Patient is status post coronary artery bypass grafting. There is aortic atherosclerosis. No bone lesions. IMPRESSION: Cardiomegaly with pulmonary vascular congestion. Suspected interstitial alveolar edema with left pleural effusion. The overall appearance is consistent with congestive heart failure. It should be noted that patchy superimposed pneumonia cannot be excluded in this circumstance. Status post coronary artery bypass grafting. Aortic Atherosclerosis (ICD10-I70.0). Electronically Signed   By: Lowella Grip III M.D.   On: 04/07/2021 10:32   ECHOCARDIOGRAM LIMITED  Result Date: 04/24/2021    ECHOCARDIOGRAM LIMITED REPORT   Patient Name:   [name] Date of Exam: 04/23/2021 Medical  Rec #:  [id number]  Height:       66.0 in Accession #:    [id number] Weight:       157.8 lb Date of Birth:  [birth year]  BSA:          1.808 m Patient Age:    58 years   BP:           167/49 mmHg Patient Gender: M          HR:           75 bpm. Exam Location:  [facility] Procedure: 2D Echo, Intracardiac Opacification Agent, Limited Echo and Limited            Color Doppler Indications:     X52.84 Acute diastolic CHF  History:         Patient has prior history of Echocardiogram examinations. Risk                  Factors:Diabetes, Hypertension and Dyslipidemia. Atrial                  fibrillation. Congestive heart failure. COPD. Coronary artery                  disease. Peripheral vascular disease. Pneumonia.  Sonographer:     Wilford Sports Rodgers-Jones Referring Phys:  [id number] [place] Diagnosing Phys: Serafina Royals MD IMPRESSIONS  1. The left ventricle demonstrates regional wall motion abnormalities (see scoring diagram/findings for description). Left ventricular diastolic parameters were normal.  2. Right ventricular systolic function is normal. The right ventricular size is normal.  3. Left atrial size was mildly dilated.  4. The mitral valve is normal in structure. Mild to moderate mitral valve regurgitation.  5. The aortic valve is normal in structure. Aortic valve regurgitation is mild. FINDINGS  Left Ventricle: The left ventricle demonstrates regional wall motion abnormalities. Severe hypokinesis of the left ventricular, apical inferolateral wall. Definity contrast agent was given IV to delineate the left ventricular endocardial borders. There is no left ventricular hypertrophy. Left ventricular diastolic parameters were normal. Right Ventricle: The right ventricular size is normal. No increase in right ventricular wall thickness. Right ventricular systolic function is normal. Left Atrium: Left atrial size was mildly dilated. Right Atrium: Right atrial size was normal in size. Pericardium: There is no evidence of  pericardial effusion. Mitral Valve: The mitral valve is normal in structure. Mild to moderate  mitral valve regurgitation. Tricuspid Valve: The tricuspid valve is normal in structure. Tricuspid valve regurgitation is mild. Aortic Valve: The aortic valve is normal in structure. Aortic valve regurgitation is mild. Aortic regurgitation PHT measures 267 msec. Aortic valve mean gradient measures 12.5 mmHg. Aortic valve peak gradient measures 20.8 mmHg. Pulmonic Valve: The pulmonic valve was normal in structure. Pulmonic valve regurgitation is not visualized. Aorta: The aortic root and ascending aorta are structurally normal, with no evidence of dilitation. IAS/Shunts: No atrial level shunt detected by color flow Doppler. LEFT VENTRICLE PLAX 2D LVIDd:         5.40 cm LVIDs:         4.20 cm LV PW:         1.10 cm LV IVS:        1.10 cm  IVC IVC diam: 1.90 cm LEFT ATRIUM         Index LA diam:    4.70 cm 2.60 cm/m  AORTIC VALVE AV Vmax:      228.00 cm/s AV Vmean:     165.000 cm/s AV VTI:       0.468 m AV Peak Grad: 20.8 mmHg AV Mean Grad: 12.5 mmHg AI PHT:       267 msec  AORTA Ao Root diam: 3.60 cm MV E velocity: 136.00 cm/s Serafina Royals MD Electronically signed by Serafina Royals MD Signature Date/Time: 04/24/2021/8:59:18 AM    Final     Microbiology: Recent Results (from the past 240 hour(s))  Blood culture (single)     Status: None (Preliminary result)   Collection Time: 04/21/21  6:36 PM   Specimen: BLOOD  Result Value Ref Range Status   Specimen Description BLOOD BLOOD LEFT WRIST  Final   Special Requests   Final    BOTTLES DRAWN AEROBIC AND ANAEROBIC Blood Culture adequate volume   Culture   Final    NO GROWTH 4 DAYS Performed at [facility], [address] [place], [place] [address]    Report Status PENDING  Incomplete  Resp Panel by RT-PCR (Flu A&B, Covid) Nasopharyngeal Swab     Status: None   Collection Time: 04/21/21  7:32 PM   Specimen: Nasopharyngeal Swab; Nasopharyngeal(NP) swabs  in vial transport medium  Result Value Ref Range Status   SARS Coronavirus 2 by RT PCR NEGATIVE NEGATIVE Final    Comment: (NOTE) SARS-CoV-2 target nucleic acids are NOT DETECTED.  The SARS-CoV-2 RNA is generally detectable in upper respiratory specimens during the acute phase of infection. The lowest concentration of SARS-CoV-2 viral copies this assay can detect is 138 copies/mL. A negative result does not preclude SARS-Cov-2 infection and should not be used as the sole basis for treatment or other patient management decisions. A negative result may occur with  improper specimen collection/handling, submission of specimen other than nasopharyngeal swab, presence of viral mutation(s) within the areas targeted by this assay, and inadequate number of viral copies(<138 copies/mL). A negative result must be combined with clinical observations, patient history, and epidemiological information. The expected result is Negative.  Fact Sheet for Patients:  EntrepreneurPulse.com.au  Fact Sheet for Healthcare Providers:  IncredibleEmployment.be  This test is no t yet approved or cleared by the [place] FDA and  has been authorized for detection and/or diagnosis of SARS-CoV-2 by FDA under an Emergency Use Authorization (EUA). This EUA will remain  in effect (meaning this test can be used) for the duration of the COVID-19 declaration under Section 564(b)(1) of the Act, 21 U.S.C.section  360bbb-3(b)(1), unless the authorization is terminated  or revoked sooner.       Influenza A by PCR NEGATIVE NEGATIVE Final   Influenza B by PCR NEGATIVE NEGATIVE Final    Comment: (NOTE) The Xpert Xpress SARS-CoV-2/FLU/RSV plus assay is intended as an aid in the diagnosis of influenza from Nasopharyngeal swab specimens and should not be used as a sole basis for treatment. Nasal washings and aspirates are unacceptable for Xpert Xpress  SARS-CoV-2/FLU/RSV testing.  Fact Sheet for Patients: EntrepreneurPulse.com.au  Fact Sheet for Healthcare Providers: IncredibleEmployment.be  This test is not yet approved or cleared by the [place] FDA and has been authorized for detection and/or diagnosis of SARS-CoV-2 by FDA under an Emergency Use Authorization (EUA). This EUA will remain in effect (meaning this test can be used) for the duration of the COVID-19 declaration under Section 564(b)(1) of the Act, 21 U.S.C. section 360bbb-3(b)(1), unless the authorization is terminated or revoked.  Performed at [facility], [address] [place], [place] [address]   Culture, blood (Routine X 2) w Reflex to ID Panel     Status: None (Preliminary result)   Collection Time: 04/21/21 10:56 PM   Specimen: BLOOD  Result Value Ref Range Status   Specimen Description BLOOD BLOOD RIGHT HAND  Final   Special Requests   Final    BOTTLES DRAWN AEROBIC AND ANAEROBIC Blood Culture adequate volume   Culture   Final    NO GROWTH 4 DAYS Performed at [facility], [address] [place], [place] [address]    Report Status PENDING  Incomplete  Culture, blood (Routine X 2) w Reflex to ID Panel     Status: None (Preliminary result)   Collection Time: 04/21/21 10:56 PM   Specimen: BLOOD  Result Value Ref Range Status   Specimen Description BLOOD BLOOD LEFT FOREARM  Final   Special Requests   Final    BOTTLES DRAWN AEROBIC AND ANAEROBIC Blood Culture adequate volume   Culture   Final    NO GROWTH 4 DAYS Performed at [facility], [address] [place],  [address]    Report Status PENDING  Incomplete     Labs: CBC: Recent Labs  Lab 04/21/21 1836 04/22/21 0354 04/24/21 0456  WBC 15.5* 5.7 9.3  NEUTROABS 9.6*  --   --   HGB 11.1* 9.8* 9.6*  HCT 37.6* 32.0* 31.1*  MCV 88.3 85.6 85.2  PLT 379 267 093   Basic Metabolic Panel: Recent Labs  Lab  04/21/21 1836 04/22/21 0354 04/23/21 0453 04/23/21 0820 04/23/21 1632 04/24/21 0456  NA 135 136  --  136 135 137  K 4.3 4.6  --  4.3 3.9 4.0  CL 99 101  --  97* 95* 96*  CO2 29 27  --  31 31 34*  GLUCOSE 257* 248*  --  150* 264* 77  BUN 23 30*  --  37* 39* 38*  CREATININE 1.61* 1.80*  --  1.52* 1.55* 1.53*  CALCIUM 8.5* 8.2*  --  8.9 8.7* 8.6*  MG  --   --  2.0 2.2 1.9 2.4   Liver Function Tests: No results for input(s): AST, ALT, ALKPHOS, BILITOT, PROT, ALBUMIN in the last 168 hours. No results for input(s): LIPASE, AMYLASE in the last 168 hours. No results for input(s): AMMONIA in the last 168 hours. Cardiac Enzymes: No results for input(s): CKTOTAL, CKMB, CKMBINDEX, TROPONINI in the last 168 hours. BNP (last 3 results) Recent Labs  11/22/20 1345 01/28/21 0338 04/21/21 1836  BNP 796.1* 1,082.4* 1,488.9*   CBG: Recent Labs  Lab 04/23/21 2223 04/24/21 0805 04/24/21 1627 04/24/21 2054 04/25/21 0812  GLUCAP 179* 110* 193* 227* 130*    Time spent: 35 minutes  Signed:  Val Riles  [facility]  04/25/2021 11:48 AM

## 2021-04-25 NOTE — Progress Notes (Addendum)
Discharge instructions explained to wife over the phone/verbalized understanding. IV and tele removed. Awaiting EMS for transport.

## 2021-04-30 ENCOUNTER — Ambulatory Visit: Payer: PPO | Admitting: Family

## 2021-05-01 LAB — CULTURE, BLOOD (ROUTINE X 2)
Culture: NO GROWTH
Culture: NO GROWTH
Special Requests: ADEQUATE
Special Requests: ADEQUATE

## 2021-05-01 LAB — CULTURE, BLOOD (SINGLE)
Culture: NO GROWTH
Special Requests: ADEQUATE

## 2021-05-02 DIAGNOSIS — E1122 Type 2 diabetes mellitus with diabetic chronic kidney disease: Secondary | ICD-10-CM | POA: Diagnosis not present

## 2021-05-02 DIAGNOSIS — I4891 Unspecified atrial fibrillation: Secondary | ICD-10-CM | POA: Diagnosis not present

## 2021-05-02 DIAGNOSIS — I509 Heart failure, unspecified: Secondary | ICD-10-CM | POA: Diagnosis not present

## 2021-05-02 DIAGNOSIS — J449 Chronic obstructive pulmonary disease, unspecified: Secondary | ICD-10-CM | POA: Diagnosis not present

## 2021-05-02 DIAGNOSIS — I130 Hypertensive heart and chronic kidney disease with heart failure and stage 1 through stage 4 chronic kidney disease, or unspecified chronic kidney disease: Secondary | ICD-10-CM | POA: Diagnosis not present

## 2021-05-02 DIAGNOSIS — I25709 Atherosclerosis of coronary artery bypass graft(s), unspecified, with unspecified angina pectoris: Secondary | ICD-10-CM | POA: Diagnosis not present

## 2021-05-02 DIAGNOSIS — N1832 Chronic kidney disease, stage 3b: Secondary | ICD-10-CM | POA: Diagnosis not present

## 2021-05-14 ENCOUNTER — Ambulatory Visit: Payer: PPO | Admitting: Dermatology

## 2021-06-03 DEATH — deceased

## 2021-09-23 ENCOUNTER — Ambulatory Visit: Payer: PPO | Admitting: Family

## 2021-10-31 ENCOUNTER — Other Ambulatory Visit: Payer: Self-pay

## 2022-09-04 IMAGING — DX DG CHEST 1V PORT
1 series · 2 of 2 positions shown · non-contrast
Comparison: 11/15/2020

CLINICAL DATA: Dyspnea

EXAM:
PORTABLE CHEST 1 VIEW

[Series 1: chest ap · 0.14mm/px · 2 of 2 slices shown]
[im 1/2]
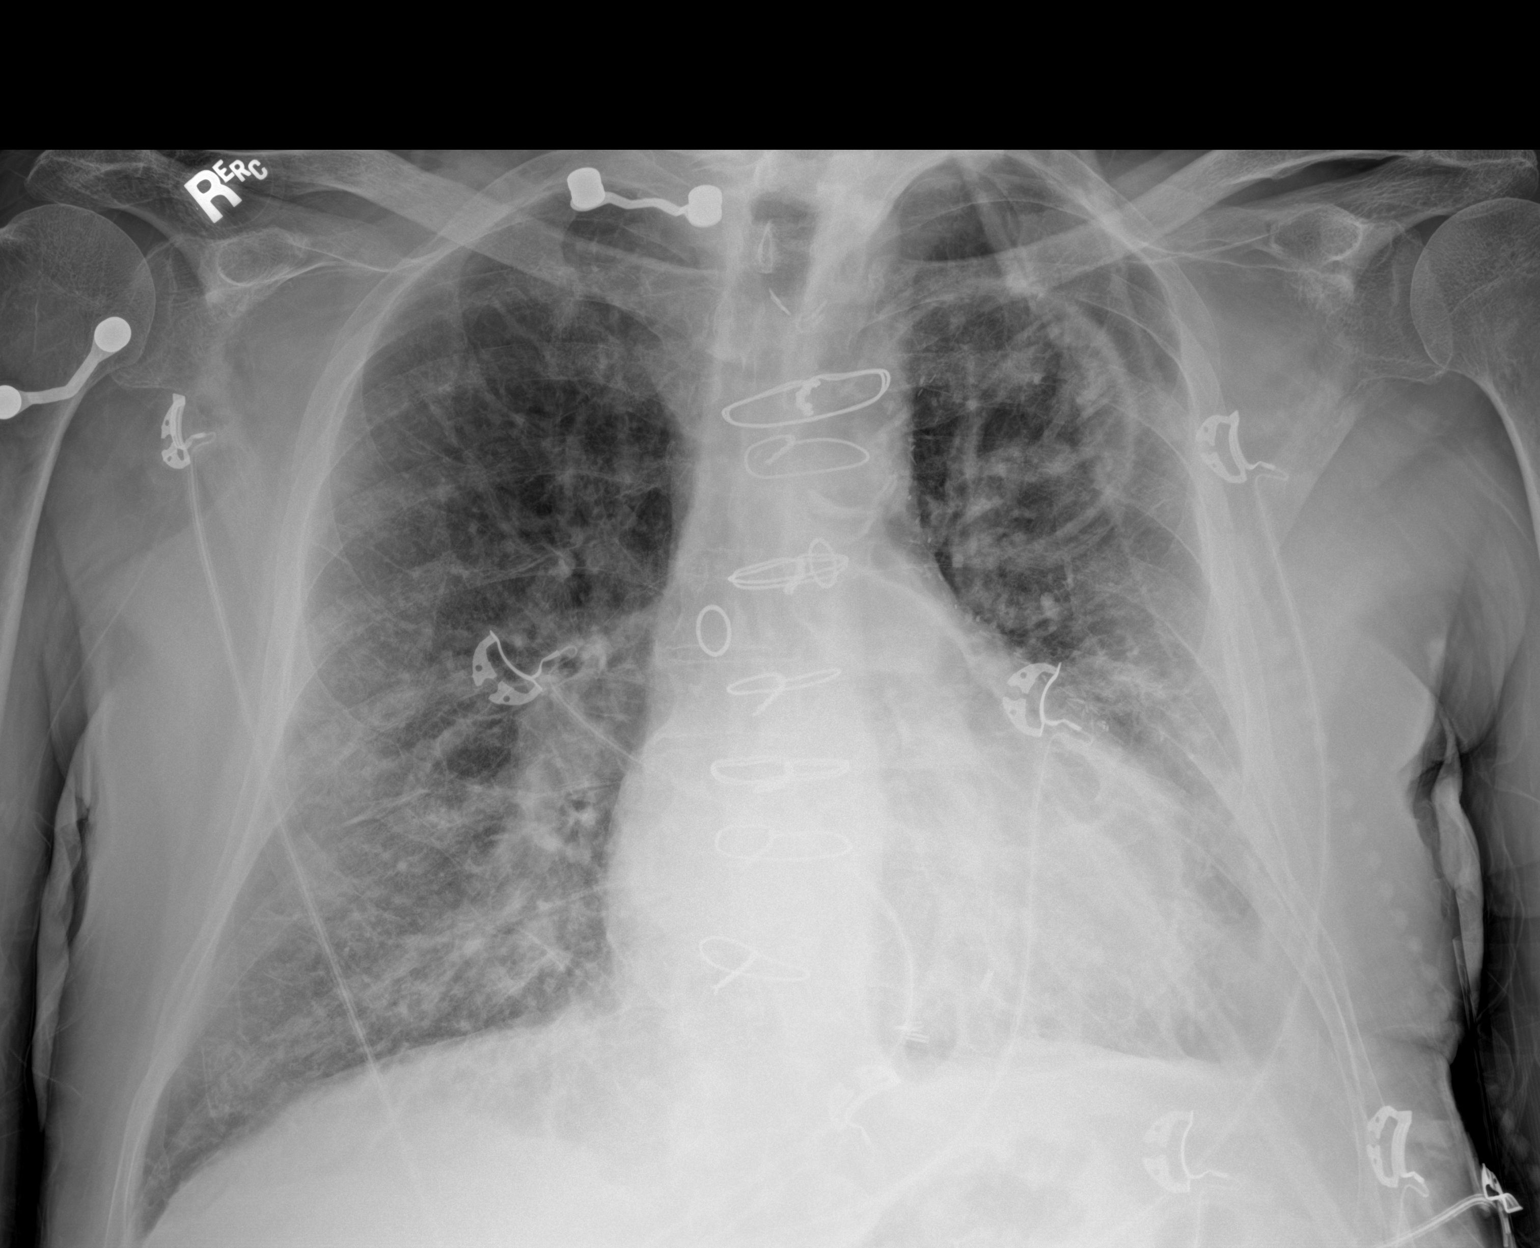
[im 2/2]
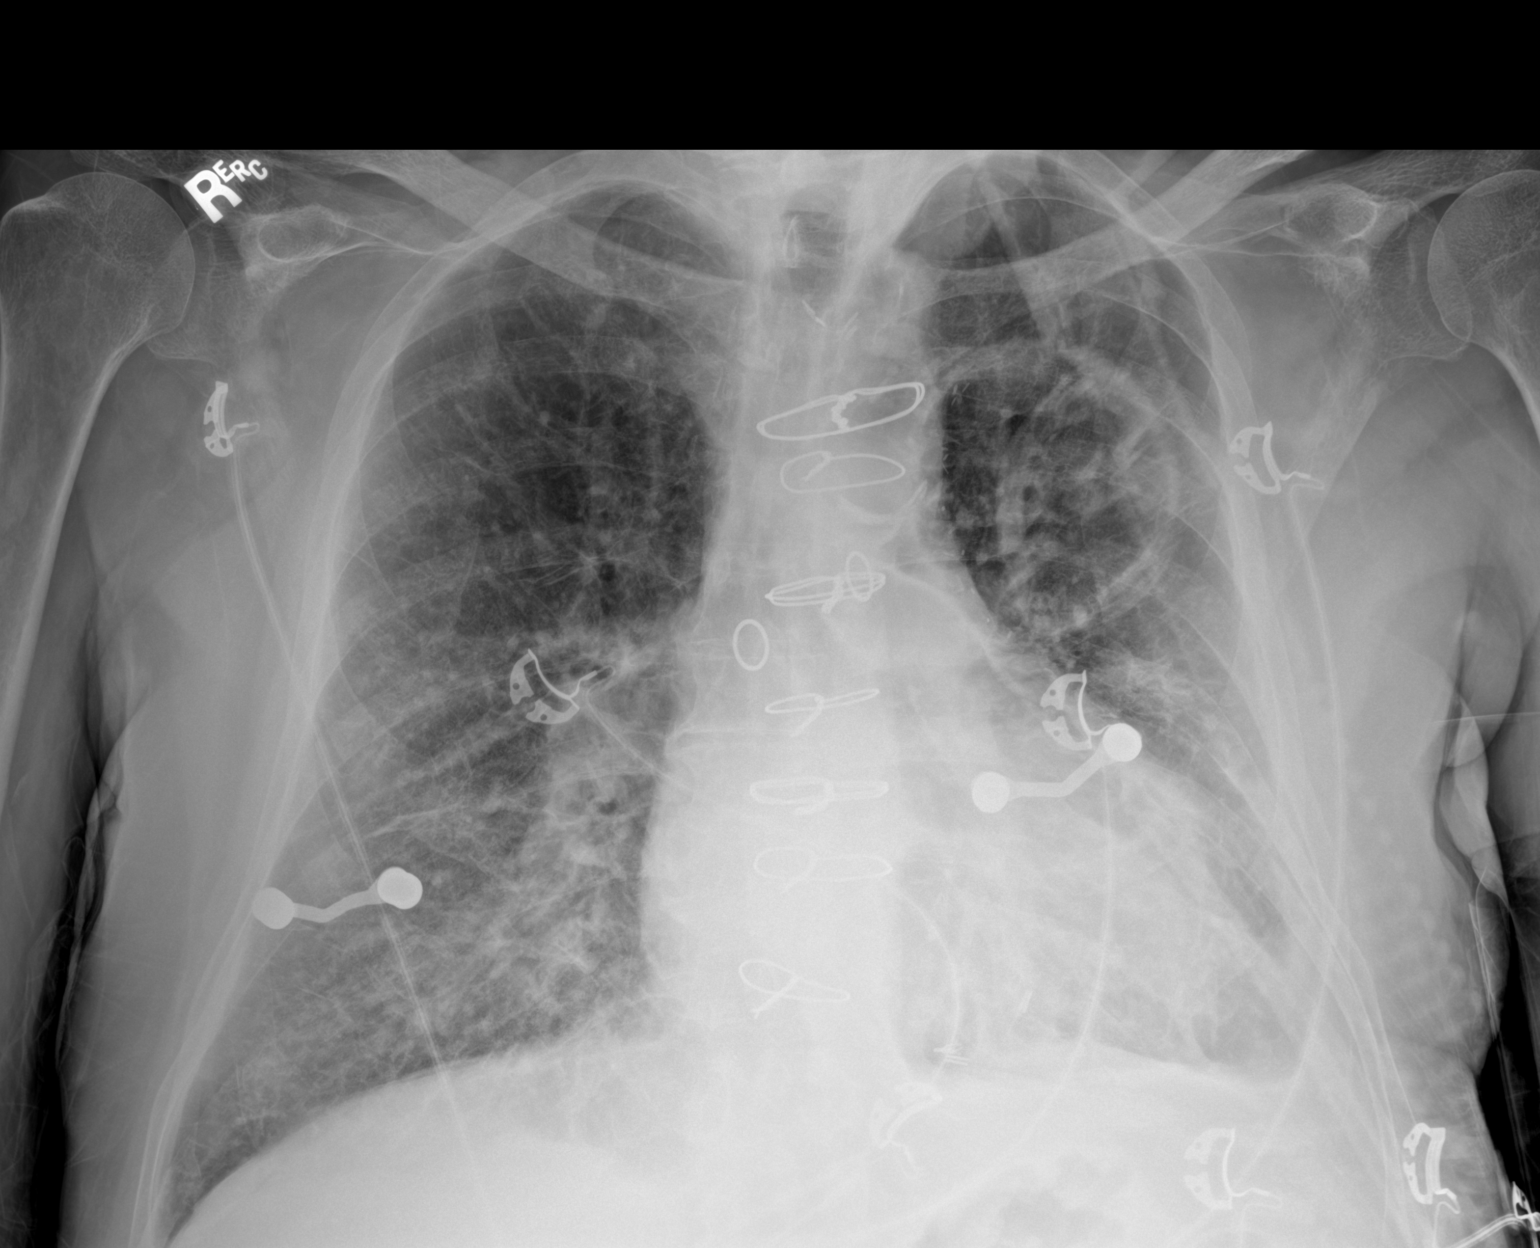

[2 of 2 positions shown; findings below may reference images not displayed]

FINDINGS: The lungs are well expanded. Mild left-sided volume loss with
associated left basilar pleural thickening is unchanged. Linear
atelectasis within the left mid lung zone. Diffuse interstitial
thickening again noted, unchanged. No superimposed focal pulmonary
infiltrate. No pneumothorax or pleural effusion. Coronary artery
bypass grafting has been performed. Cardiac size within normal
limits.
IMPRESSION: No active disease. Stable mild left-sided volume loss with left
pleural thickening.

## 2022-09-08 IMAGING — DX DG CHEST 1V PORT
1 series · 1 of 1 positions shown · non-contrast
Comparison: 01/28/2021, 11/15/2020, 10/25/2020, 09/26/2019

CLINICAL DATA: Respiratory distress

EXAM:
PORTABLE CHEST 1 VIEW

[chest ap]
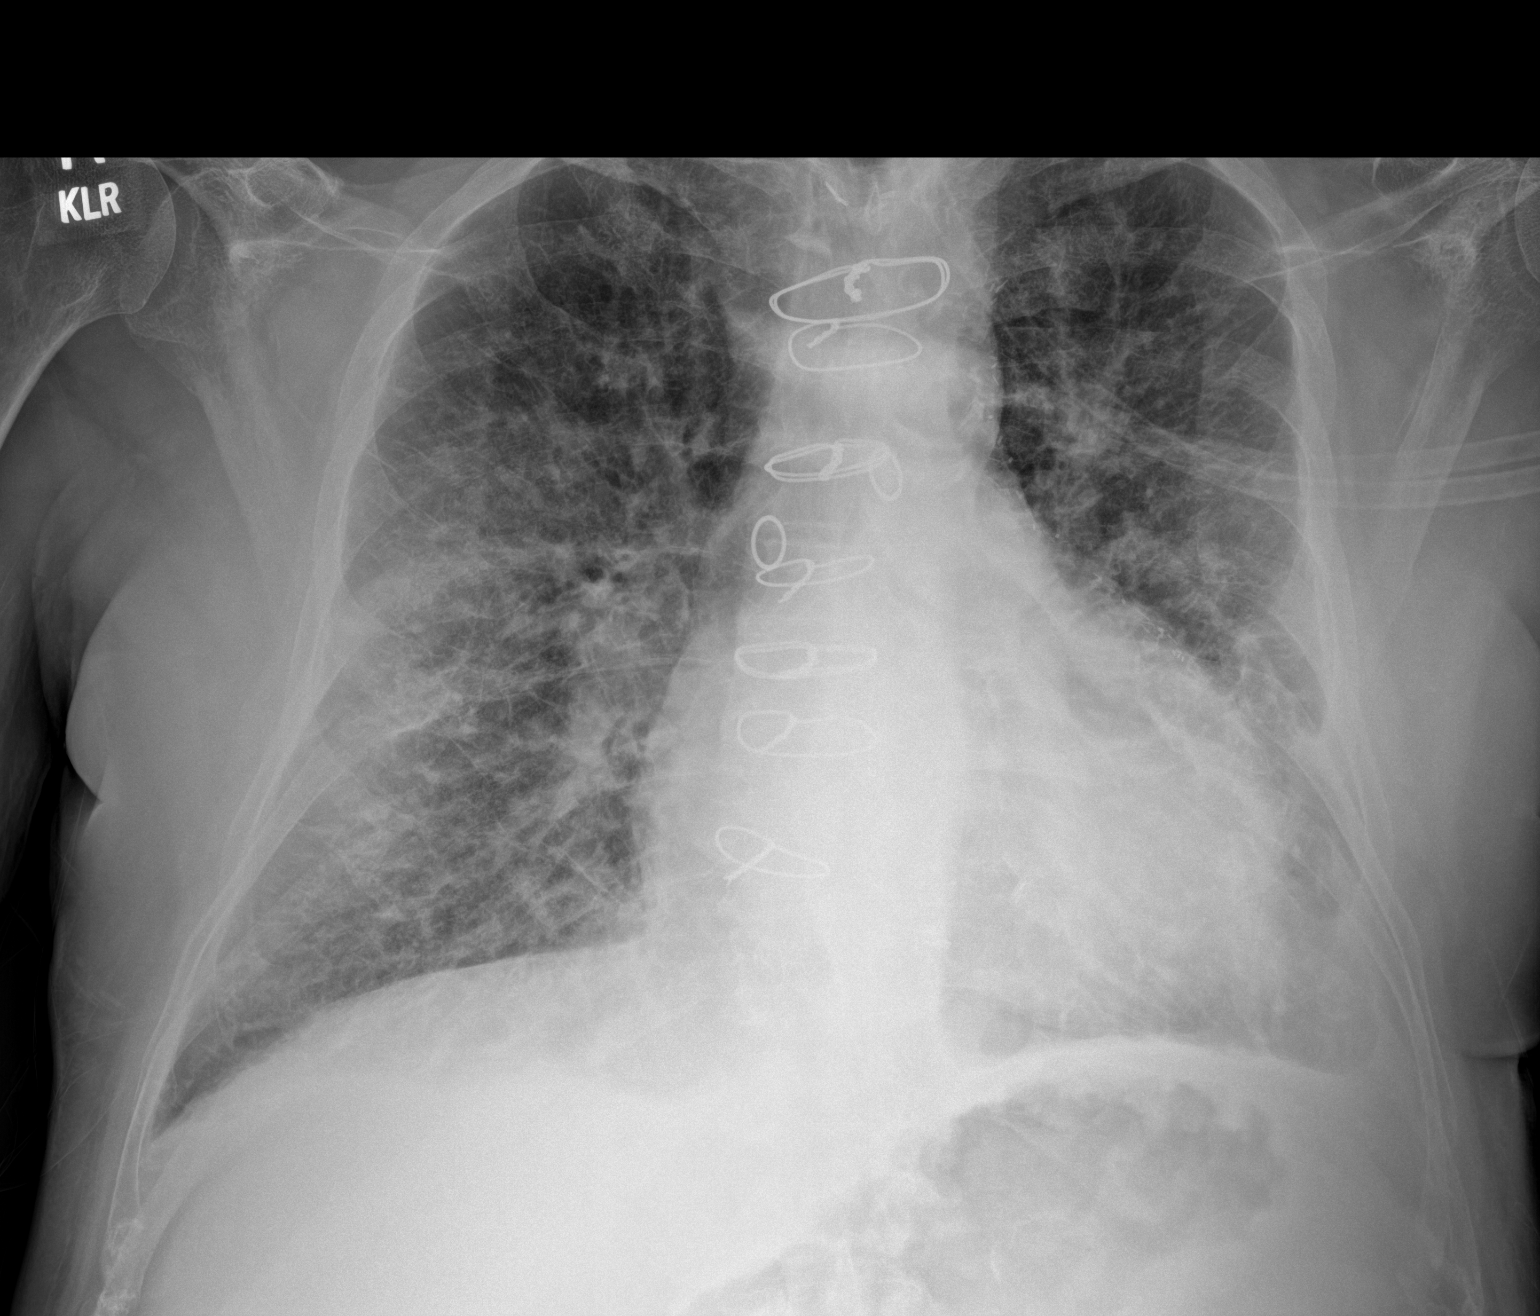

[1 of 1 positions shown; findings below may reference images not displayed]

FINDINGS: Post sternotomy changes. Diffuse bilateral interstitial opacity,
some of which is felt secondary to chronic disease. There is slight
increased ground-glass opacity bilaterally suspicious for
superimposed acute edema or possible pneumonia. Blunting at the left
costophrenic sulcus probably chronic and due to scarring. Mild
cardiomegaly with aortic atherosclerosis. No pneumothorax.
IMPRESSION: Suspect some degree of chronic interstitial changes. However there
is increased interstitial and ground-glass opacity bilaterally
suspicious for superimposed acute edema or possible pneumonia. Mild
cardiomegaly.
